# Patient Record
Sex: Male | Born: 1950 | Race: Black or African American | Hispanic: No | Marital: Single | Smoking: Current every day smoker
Health system: Southern US, Community
[De-identification: ages and names within clinical notes are randomized; demographics above are authoritative.]

## PROBLEM LIST (undated history)

## (undated) DIAGNOSIS — K635 Polyp of colon: Secondary | ICD-10-CM

## (undated) DIAGNOSIS — N400 Enlarged prostate without lower urinary tract symptoms: Secondary | ICD-10-CM

## (undated) DIAGNOSIS — E785 Hyperlipidemia, unspecified: Secondary | ICD-10-CM

## (undated) DIAGNOSIS — I10 Essential (primary) hypertension: Secondary | ICD-10-CM

## (undated) DIAGNOSIS — C642 Malignant neoplasm of left kidney, except renal pelvis: Secondary | ICD-10-CM

## (undated) DIAGNOSIS — R911 Solitary pulmonary nodule: Secondary | ICD-10-CM

## (undated) DIAGNOSIS — I739 Peripheral vascular disease, unspecified: Secondary | ICD-10-CM

## (undated) DIAGNOSIS — I2699 Other pulmonary embolism without acute cor pulmonale: Secondary | ICD-10-CM

---

## 2013-03-06 HISTORY — PX: PROSTATE BIOPSY: SHX241

## 2017-03-06 HISTORY — PX: COLONOSCOPY: SHX174

## 2018-02-03 HISTORY — PX: PARTIAL NEPHRECTOMY: SHX414

## 2020-10-06 ENCOUNTER — Emergency Department (HOSPITAL_BASED_OUTPATIENT_CLINIC_OR_DEPARTMENT_OTHER): Payer: No Typology Code available for payment source

## 2020-10-06 ENCOUNTER — Other Ambulatory Visit: Payer: Self-pay

## 2020-10-06 ENCOUNTER — Encounter (HOSPITAL_BASED_OUTPATIENT_CLINIC_OR_DEPARTMENT_OTHER): Payer: Self-pay | Admitting: *Deleted

## 2020-10-06 ENCOUNTER — Inpatient Hospital Stay (HOSPITAL_BASED_OUTPATIENT_CLINIC_OR_DEPARTMENT_OTHER)
Admission: EM | Admit: 2020-10-06 | Discharge: 2020-10-08 | DRG: 065 | Disposition: A | Discharge status: Home / Self Care | Payer: No Typology Code available for payment source | Attending: Internal Medicine | Admitting: Internal Medicine

## 2020-10-06 DIAGNOSIS — R7303 Prediabetes: Secondary | ICD-10-CM | POA: Diagnosis present

## 2020-10-06 DIAGNOSIS — R791 Abnormal coagulation profile: Secondary | ICD-10-CM | POA: Diagnosis present

## 2020-10-06 DIAGNOSIS — E782 Mixed hyperlipidemia: Secondary | ICD-10-CM

## 2020-10-06 DIAGNOSIS — I10 Essential (primary) hypertension: Secondary | ICD-10-CM

## 2020-10-06 DIAGNOSIS — G8194 Hemiplegia, unspecified affecting left nondominant side: Secondary | ICD-10-CM | POA: Diagnosis present

## 2020-10-06 DIAGNOSIS — H53462 Homonymous bilateral field defects, left side: Secondary | ICD-10-CM | POA: Diagnosis present

## 2020-10-06 DIAGNOSIS — F17210 Nicotine dependence, cigarettes, uncomplicated: Secondary | ICD-10-CM | POA: Diagnosis present

## 2020-10-06 DIAGNOSIS — F17200 Nicotine dependence, unspecified, uncomplicated: Secondary | ICD-10-CM | POA: Insufficient documentation

## 2020-10-06 DIAGNOSIS — Z20822 Contact with and (suspected) exposure to covid-19: Secondary | ICD-10-CM | POA: Diagnosis present

## 2020-10-06 DIAGNOSIS — Z905 Acquired absence of kidney: Secondary | ICD-10-CM

## 2020-10-06 DIAGNOSIS — I480 Paroxysmal atrial fibrillation: Secondary | ICD-10-CM | POA: Diagnosis present

## 2020-10-06 DIAGNOSIS — Z85528 Personal history of other malignant neoplasm of kidney: Secondary | ICD-10-CM | POA: Diagnosis not present

## 2020-10-06 DIAGNOSIS — Z79899 Other long term (current) drug therapy: Secondary | ICD-10-CM

## 2020-10-06 DIAGNOSIS — I63411 Cerebral infarction due to embolism of right middle cerebral artery: Secondary | ICD-10-CM | POA: Diagnosis present

## 2020-10-06 DIAGNOSIS — E785 Hyperlipidemia, unspecified: Secondary | ICD-10-CM | POA: Diagnosis present

## 2020-10-06 DIAGNOSIS — Z8719 Personal history of other diseases of the digestive system: Secondary | ICD-10-CM | POA: Diagnosis not present

## 2020-10-06 DIAGNOSIS — Z86711 Personal history of pulmonary embolism: Secondary | ICD-10-CM

## 2020-10-06 DIAGNOSIS — R29703 NIHSS score 3: Secondary | ICD-10-CM | POA: Diagnosis present

## 2020-10-06 DIAGNOSIS — I6389 Other cerebral infarction: Secondary | ICD-10-CM | POA: Diagnosis not present

## 2020-10-06 DIAGNOSIS — N400 Enlarged prostate without lower urinary tract symptoms: Secondary | ICD-10-CM | POA: Diagnosis present

## 2020-10-06 DIAGNOSIS — Z7901 Long term (current) use of anticoagulants: Secondary | ICD-10-CM | POA: Diagnosis not present

## 2020-10-06 DIAGNOSIS — I639 Cerebral infarction, unspecified: Secondary | ICD-10-CM | POA: Diagnosis present

## 2020-10-06 DIAGNOSIS — R29810 Facial weakness: Secondary | ICD-10-CM | POA: Diagnosis present

## 2020-10-06 HISTORY — DX: Solitary pulmonary nodule: R91.1

## 2020-10-06 HISTORY — DX: Hyperlipidemia, unspecified: E78.5

## 2020-10-06 HISTORY — DX: Essential (primary) hypertension: I10

## 2020-10-06 HISTORY — DX: Malignant neoplasm of left kidney, except renal pelvis: C64.2

## 2020-10-06 HISTORY — DX: Mixed hyperlipidemia: E78.2

## 2020-10-06 HISTORY — DX: Peripheral vascular disease, unspecified: I73.9

## 2020-10-06 HISTORY — DX: Benign prostatic hyperplasia without lower urinary tract symptoms: N40.0

## 2020-10-06 HISTORY — DX: Polyp of colon: K63.5

## 2020-10-06 HISTORY — DX: Other pulmonary embolism without acute cor pulmonale: I26.99

## 2020-10-06 HISTORY — DX: Cerebral infarction, unspecified: I63.9

## 2020-10-06 LAB — CBC
HCT: 43.7 % (ref 39.0–52.0)
Hemoglobin: 14.3 g/dL (ref 13.0–17.0)
MCH: 26.3 pg (ref 26.0–34.0)
MCHC: 32.7 g/dL (ref 30.0–36.0)
MCV: 80.5 fL (ref 80.0–100.0)
Platelets: 194 10*3/uL (ref 150–400)
RBC: 5.43 MIL/uL (ref 4.22–5.81)
RDW: 15.8 % — ABNORMAL HIGH (ref 11.5–15.5)
WBC: 6.3 10*3/uL (ref 4.0–10.5)
nRBC: 0 % (ref 0.0–0.2)

## 2020-10-06 LAB — URINALYSIS, MICROSCOPIC (REFLEX)

## 2020-10-06 LAB — BASIC METABOLIC PANEL
Anion gap: 8 (ref 5–15)
BUN: 20 mg/dL (ref 8–23)
CO2: 25 mmol/L (ref 22–32)
Calcium: 8.7 mg/dL — ABNORMAL LOW (ref 8.9–10.3)
Chloride: 103 mmol/L (ref 98–111)
Creatinine, Ser: 1.15 mg/dL (ref 0.61–1.24)
GFR, Estimated: >60 mL/min (ref >60)
Glucose, Bld: 143 mg/dL — ABNORMAL HIGH (ref 70–99)
Potassium: 3.3 mmol/L — ABNORMAL LOW (ref 3.5–5.1)
Sodium: 136 mmol/L (ref 135–145)

## 2020-10-06 LAB — RESP PANEL BY RT-PCR (FLU A&B, COVID) ARPGX2
Influenza A by PCR: NEGATIVE
Influenza B by PCR: NEGATIVE
SARS Coronavirus 2 by RT PCR: NEGATIVE

## 2020-10-06 LAB — URINALYSIS, ROUTINE W REFLEX MICROSCOPIC
Bilirubin Urine: NEGATIVE
Glucose, UA: NEGATIVE mg/dL
Ketones, ur: NEGATIVE mg/dL
Nitrite: NEGATIVE
Protein, ur: NEGATIVE mg/dL
Specific Gravity, Urine: 1.02 (ref 1.005–1.030)
pH: 6 (ref 5.0–8.0)

## 2020-10-06 LAB — HEPATIC FUNCTION PANEL
ALT: 15 U/L (ref 0–44)
AST: 26 U/L (ref 15–41)
Albumin: 4.3 g/dL (ref 3.5–5.0)
Alkaline Phosphatase: 81 U/L (ref 38–126)
Bilirubin, Direct: 0.2 mg/dL (ref 0.0–0.2)
Indirect Bilirubin: 0.6 mg/dL (ref 0.3–0.9)
Total Bilirubin: 0.8 mg/dL (ref 0.3–1.2)
Total Protein: 7.8 g/dL (ref 6.5–8.1)

## 2020-10-06 LAB — PROTIME-INR
INR: 1.8 — ABNORMAL HIGH (ref 0.8–1.2)
Prothrombin Time: 20.4 seconds — ABNORMAL HIGH (ref 11.4–15.2)

## 2020-10-06 LAB — TROPONIN I (HIGH SENSITIVITY)
Troponin I (High Sensitivity): 9 ng/L (ref <18)
Troponin I (High Sensitivity): 9 ng/L (ref <18)

## 2020-10-06 LAB — CBG MONITORING, ED: Glucose-Capillary: 136 mg/dL — ABNORMAL HIGH (ref 70–99)

## 2020-10-06 MED ORDER — IOHEXOL 350 MG/ML SOLN
100.0000 mL | Freq: Once | INTRAVENOUS | Status: AC | PRN
  Administered 2020-10-06: 100 mL via INTRAVENOUS

## 2020-10-06 MED ORDER — PSYLLIUM 95 % PO PACK
1.0000 | PACK | Freq: Every day | ORAL | Status: DC
  Administered 2020-10-07 – 2020-10-08 (×2): 1 via ORAL
  Filled 2020-10-06 (×2): qty 1

## 2020-10-06 MED ORDER — ASPIRIN 300 MG RE SUPP: 300.0000 mg | Freq: Every day | RECTAL | Status: DC

## 2020-10-06 MED ORDER — LABETALOL HCL 5 MG/ML IV SOLN
10.0000 mg | Freq: Once | INTRAVENOUS | Status: AC
  Administered 2020-10-06: 10 mg via INTRAVENOUS
  Filled 2020-10-06: qty 4

## 2020-10-06 MED ORDER — PSYLLIUM 58.6 % PO PACK: 1.0000 | PACK | Freq: Every day | ORAL | Status: DC

## 2020-10-06 MED ORDER — ASPIRIN 325 MG PO TABS
325.0000 mg | ORAL_TABLET | Freq: Every day | ORAL | Status: DC
  Administered 2020-10-07 – 2020-10-08 (×2): 325 mg via ORAL
  Filled 2020-10-06 (×2): qty 1

## 2020-10-06 MED ORDER — ATORVASTATIN CALCIUM 40 MG PO TABS
40.0000 mg | ORAL_TABLET | Freq: Every day | ORAL | Status: DC
  Administered 2020-10-07 – 2020-10-08 (×2): 40 mg via ORAL
  Filled 2020-10-06 (×2): qty 1

## 2020-10-06 MED ORDER — ACETAMINOPHEN 650 MG RE SUPP: 650.0000 mg | RECTAL | Status: DC | PRN

## 2020-10-06 MED ORDER — ACETAMINOPHEN 325 MG PO TABS: 650.0000 mg | ORAL_TABLET | ORAL | Status: DC | PRN

## 2020-10-06 MED ORDER — STROKE: EARLY STAGES OF RECOVERY BOOK
Freq: Once | Status: AC
  Filled 2020-10-06: qty 1

## 2020-10-06 MED ORDER — FINASTERIDE 5 MG PO TABS
5.0000 mg | ORAL_TABLET | Freq: Every day | ORAL | Status: DC
  Administered 2020-10-07 – 2020-10-08 (×2): 5 mg via ORAL
  Filled 2020-10-06 (×2): qty 1

## 2020-10-06 MED ORDER — ACETAMINOPHEN 160 MG/5ML PO SOLN: 650.0000 mg | ORAL | Status: DC | PRN

## 2020-10-06 MED ORDER — NICOTINE 21 MG/24HR TD PT24
21.0000 mg | MEDICATED_PATCH | Freq: Every day | TRANSDERMAL | Status: DC
  Administered 2020-10-06 – 2020-10-08 (×3): 21 mg via TRANSDERMAL
  Filled 2020-10-06 (×3): qty 1

## 2020-10-06 MED ORDER — ENOXAPARIN SODIUM 40 MG/0.4ML IJ SOSY
40.0000 mg | PREFILLED_SYRINGE | INTRAMUSCULAR | Status: DC
  Administered 2020-10-07 – 2020-10-08 (×2): 40 mg via SUBCUTANEOUS
  Filled 2020-10-06 (×2): qty 0.4

## 2020-10-06 NOTE — ED Notes (Signed)
MD ware of pt

## 2020-10-06 NOTE — ED Notes (Signed)
Report given to [facility] 

## 2020-10-06 NOTE — Consult Note (Addendum)
Neurology Consultation Reason for Consult: Stroke Referring Physician: Rae Halsted  CC: Vision change  History is obtained from: Patient  HPI: Clifford [name] is a 70 y.o. male with a history of hypertension, hyperlipidemia, pulmonary embolus on Coumadin who presents with vision change and mild left-sided weakness that started 2 days ago.  He lives in [place] and sees the [place], but is in town recently visiting family.  He endorses balance difficulty as well.  Due to these problems he sought care in the emergency department at [facility] where CT was performed demonstrating a subacute stroke.   LKW: 2 days ago tpa given?: no, out of window   ROS: A 14 point ROS was performed and is negative except as noted in the HPI.   Past Medical History:  Diagnosis Date   BPH (benign prostatic hyperplasia)    HLD (hyperlipidemia)    HTN (hypertension)    Lung nodule    3m in 2015   [place]    Peripheral vascular disease (HCC)    Polyp of colon    [place] ([facility]      History reviewed. No pertinent family history.   Social History:  reports that he has been smoking cigarettes. He has been smoking an average of .5 packs per day. He has never used smokeless tobacco. He reports that he does not drink alcohol and does not use drugs.   Exam: Current vital signs: BP 139/68 (BP Location: Right Arm)   Pulse (!) 59   Temp 98 F (36.7 C) (Oral)   Resp 18   Ht '5\' 5"'$  (1.651 m)   SpO2 99%  Vital signs in last 24 hours: Temp:  [98 F (36.7 C)-98.6 F (37 C)] 98 F (36.7 C) (08/03 2048) Pulse Rate:  [59-86] 59 (08/03 2048) Resp:  [16-22] 18 (08/03 2048) BP: (137-219)/(68-176) 139/68 (08/03 2048) SpO2:  [98 %-100 %] 99 % (08/03 1700)   Physical Exam  Constitutional: Appears well-developed and well-nourished.  Psych: Affect appropriate to situation Eyes: No scleral injection HENT: No OP obstruction MSK: no joint deformities.  Cardiovascular:  Normal rate and regular rhythm.  Respiratory: Effort normal, non-labored breathing GI: Soft.  No distension. There is no tenderness.  Skin: WDI  Neuro: Mental Status: Patient is awake, alert, oriented to person, place, month, year, and situation. Patient is able to give a clear and coherent history. No signs of aphasia  He has a mild left motor neglect.   Cranial Nerves: II: Left hemianopia. Pupils are equal, round, and reactive to light.   III,IV, VI: EOMI without ptosis or diploplia.  V: Facial sensation is symmetric to temperature VII: Facial movement with mild left facial weakness VIII: hearing is intact to voice X: Uvula elevates symmetrically XI: Shoulder shrug is symmetric. XII: tongue is midline without atrophy or fasciculations.  Motor: Tone is normal. Bulk is normal. 5/5 strength was present on the right, he has 4+/5 strength of the left arm and leg Sensory: Sensation is symmetric to light touch and temperature in the arms and legs.Cerebellar: FNF  intact bilaterally  NIHSS score: 3 1A: Level of Consciousness - 0 1B: Ask Month and Age - 0 1C: 'Blink Eyes' & 'Squeeze Hands' - 0 2: Test Horizontal Extraocular Movements - 0 3: Test Visual Fields - 2 4: Test Facial Palsy - 0 5A: Test Left Arm Motor Drift - 1 5B: Test Right Arm Motor Drift - 0 6A: Test Left Leg Motor Drift -  0 6B: Test Right Leg Motor Drift - 0 7: Test Limb Ataxia - 0 8: Test Sensation - 0 9: Test Language/Aphasia- 0 10: Test Dysarthria - 0 11: Test Extinction/Inattention - 0    I have reviewed labs in epic and the results pertinent to this consultation are: Creatinine 1.15 CBC-negative  I have reviewed the images obtained: CT head-right parietal infarct in the posterior right MCA distribution  Impression: 70 year old male with what I suspect to be an embolic stroke.  He is subtherapeutic on his INR, and with his history of thromboembolism, left-to-right shunt is a possibility.  Also with 50 to  70% stenosis in the right carotid siphon, which could be a potential source of embolism, though in that range it is slightly less common.  Recommendations: 1) lipids, A1c 2) MRI brain 3) echocardiogram 4) telemetry 5) would hold anticoagulation, at least pending MRI 6) ASA 325 mg daily 7) if no other causes are found, may need to consider discussing carotid stenosis with neuro IR  Roland Rack, MD [facility] [phone]  If 7pm- 7am, please page neurology on call as listed in [place].

## 2020-10-06 NOTE — ED Triage Notes (Signed)
C/o unsteady gait , left sided weakness and left side vision loss , increased bp , h/a x 3 days.

## 2020-10-06 NOTE — H&P (Signed)
History and Physical    Clifford [name] [id number] DOB: [birth year] DOA: 10/06/2020  PCP: System, Provider Not In  Patient coming from: Home  I have personally briefly reviewed patient's old medical records in [place]  Chief Complaint: Vision change  HPI: Clifford [name] is a 70 y.o. male with medical history significant of HTN, HLD, PAD, PEs on coumdin chronically.  Last PE was at least 6+ months ago pt thinks.  Pt presents to ED at [facility] with c/o vision change and mild L sided weakness.  Symptoms onset 2 days ago, symptoms persistent and constant.  Nothing makes symptoms better or worse.  He has loss of L sided vision in both eyes.  Pt lives in [place] and normally seen at [place] but is in town currently visiting family.  Has balance difficulty as well.   ED Course: CT demonstrates subacute stroke R MCA territory.  CTA: 1) 50-70% R carotid stenosis 2) no intracranial LVO  Pt transferred to [facility] for [facility].   Review of Systems: As per HPI, otherwise all review of systems negative.  Past Medical History:  Diagnosis Date   BPH (benign prostatic hyperplasia)    HLD (hyperlipidemia)    HTN (hypertension)    Lung nodule    41m in 2015   Malign[facility]    Peripheral vascular disease (HCC)    Polyp of colon    Pulmonary embolus ([facility]     Past Surgical History:  Procedure Laterality Date   COLONOSCOPY  03/2017   PARTIAL NEPHRECTOMY Left 02/2018   PROSTATE BIOPSY  2015     reports that he has been smoking cigarettes. He has been smoking an average of .5 packs per day. He has never used smokeless tobacco. He reports that he does not drink alcohol and does not use drugs.  Allergies  Allergen Reactions   Apixaban     Other reaction(s): Liver enzymes abnormal   Lisinopril     Other reaction(s): Cough (finding)   Simvastatin     Other reaction(s): Pain in lower limb (finding)    Family History  Problem Relation Age of Onset   Clotting  disorder Neg Hx      Prior to Admission medications   Medication Sig Start Date End Date Taking? Authorizing Provider  atorvastatin (LIPITOR) 80 MG tablet Take 40 mg by mouth 1 day or 1 dose. 12/04/19  Yes [provider]  finasteride (PROSCAR) 5 MG tablet Take 5 mg by mouth daily at 6 (six) AM. 08/05/20  Yes [provider]  hydrochlorothiazide (HYDRODIURIL) 25 MG tablet Take 1 tablet by mouth daily. 12/04/19  Yes [provider]  losartan (COZAAR) 50 MG tablet Take 100 mg by mouth daily. 12/04/19  Yes [provider]  potassium chloride SA (KLOR-CON) 20 MEQ tablet Take 20 mEq by mouth daily. 12/04/19  Yes [provider]  psyllium (METAMUCIL) 58.6 % packet Take 1 packet by mouth daily. 04/08/20  Yes [provider]  warfarin (COUMADIN) 5 MG tablet Take 5-7.5 mg by mouth See admin instructions. Take one tablet by mouth daily except for wed take 7.5 mg tablet per patient   Yes [provider]    Physical Exam: Vitals:   10/06/20 1630 10/06/20 1700 10/06/20 1810 10/06/20 2048  BP: 137/78 (!) 142/81 (!) 161/89 139/68  Pulse: 66 66 65 (!) 59  Resp: '17 19 18 18  '$ Temp:   98 F (36.7 C) 98 F (36.7 C)  TempSrc:   Oral Oral  SpO2: 100% 99%    Height:        Constitutional: NAD, calm, comfortable Eyes: PERRL, lids and conjunctivae normal ENMT: Mucous membranes are moist. Posterior pharynx clear of any exudate or lesions.Normal dentition.  Neck: normal, supple, no masses, no thyromegaly Respiratory: clear to auscultation bilaterally, no wheezing, no crackles. Normal respiratory effort. No accessory muscle use.  Cardiovascular: Regular rate and rhythm, no murmurs / rubs / gallops. No extremity edema. 2+ pedal pulses. No carotid bruits.  Abdomen: no tenderness, no masses palpated. No hepatosplenomegaly. Bowel sounds positive.  Musculoskeletal: no clubbing / cyanosis. No joint deformity upper and lower extremities. Good ROM, no  contractures. Normal muscle tone.  Skin: no rashes, lesions, ulcers. No induration Neurologic: Mild L motor neglect, L hemianopia Psychiatric: Normal judgment and insight. Alert and oriented x 3. Normal mood.    Labs on Admission: I have personally reviewed following labs and imaging studies  CBC: Recent Labs  Lab 10/06/20 1353  WBC 6.3  HGB 14.3  HCT 43.7  MCV 80.5  PLT [id number]   Basic Metabolic Panel: Recent Labs  Lab 10/06/20 1353  NA 136  K 3.3*  CL 103  CO2 25  GLUCOSE 143*  BUN 20  CREATININE 1.15  CALCIUM 8.7*   GFR: CrCl cannot be calculated (Unknown ideal weight.). Liver Function Tests: Recent Labs  Lab 10/06/20 1434  AST 26  ALT 15  ALKPHOS 81  BILITOT 0.8  PROT 7.8  ALBUMIN 4.3   No results for input(s): LIPASE, AMYLASE in the last 168 hours. No results for input(s): AMMONIA in the last 168 hours. Coagulation Profile: Recent Labs  Lab 10/06/20 1434  INR 1.8*   Cardiac Enzymes: No results for input(s): CKTOTAL, CKMB, CKMBINDEX, TROPONINI in the last 168 hours. BNP (last 3 results) No results for input(s): PROBNP in the last 8760 hours. HbA1C: No results for input(s): HGBA1C in the last 72 hours. CBG: Recent Labs  Lab 10/06/20 1351  GLUCAP 136*   Lipid Profile: No results for input(s): CHOL, HDL, LDLCALC, TRIG, CHOLHDL, LDLDIRECT in the last 72 hours. Thyroid Function Tests: No results for input(s): TSH, T4TOTAL, FREET4, T3FREE, THYROIDAB in the last 72 hours. Anemia Panel: No results for input(s): VITAMINB12, FOLATE, FERRITIN, TIBC, IRON, RETICCTPCT in the last 72 hours. Urine analysis:    Component Value Date/Time   COLORURINE YELLOW 10/06/2020 1353   APPEARANCEUR CLEAR 10/06/2020 1353   LABSPEC 1.020 10/06/2020 1353   PHURINE 6.0 10/06/2020 1353   GLUCOSEU NEGATIVE 10/06/2020 1353   HGBUR TRACE (A) 10/06/2020 1353   BILIRUBINUR NEGATIVE 10/06/2020 1353   KETONESUR NEGATIVE 10/06/2020 1353   PROTEINUR NEGATIVE 10/06/2020 1353    NITRITE NEGATIVE 10/06/2020 1353   LEUKOCYTESUR SMALL (A) 10/06/2020 1353    Radiological Exams on Admission: CT Angio Head W or Wo Contrast  Result Date: 10/06/2020 CLINICAL DATA:  Stroke. TIA. Assess intracranial arteries. Unsteady gait. Left-sided weakness. Left-sided visual loss. Headache. EXAM: CT ANGIOGRAPHY HEAD AND NECK TECHNIQUE: Multidetector CT imaging of the head and neck was performed using the standard protocol during bolus administration of intravenous contrast. Multiplanar CT image reconstructions and MIPs were obtained to evaluate the vascular anatomy. Carotid stenosis measurements (when applicable) are obtained utilizing NASCET criteria, using the distal internal carotid diameter as the denominator. CONTRAST:  187m OMNIPAQUE IOHEXOL 350 MG/ML SOLN COMPARISON:  Head CT earlier same day FINDINGS: CTA NECK FINDINGS Aortic arch: Aortic atherosclerosis.  Branching pattern is normal. Right carotid system: Common  carotid artery is tortuous but widely patent to the bifurcation. Carotid bifurcation is widely patent. Minimal calcified plaque at the distal ICA bulb but no stenosis. Cervical ICA widely patent. Left carotid system: Common carotid artery is tortuous but widely patent to the bifurcation. Carotid bifurcation is normal. Minimal calcified plaque at the ICA bulb but no stenosis. Cervical ICA widely patent beyond that. Vertebral arteries: Right vertebral artery origin is widely patent. Left vertebral artery origin is poorly seen because of regional venous reflux. Both vertebral arteries do appear patent through the cervical region to the foramen magnum. Skeleton: Ordinary cervical spondylosis. Other neck: No mass or lymphadenopathy. Upper chest: Upper lungs are clear. Review of the MIP images confirms the above findings CTA HEAD FINDINGS Anterior circulation: Both internal carotid arteries are patent through the skull base and siphon regions. There is ordinary siphon atherosclerotic  calcification. On the left, there is no stenosis. On the right, there is 50-70% stenosis in the siphon. Supraclinoid internal carotid arteries are widely patent. The anterior and middle cerebral vessels are patent. No large vessel occlusion, with particular attention to the right MCA. Posterior circulation: Both vertebral arteries are patent through the foramen magnum to the basilar. No basilar stenosis. Posterior circulation branch vessels are normal. Venous sinuses: Patent and normal. Anatomic variants: None significant. Review of the MIP images confirms the above findings IMPRESSION: No intracranial large vessel occlusion identified at this time, with specific attention to the right MCA branches. Minimal atherosclerotic change at both internal carotid artery bulbs. No stenosis. 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 No stenosis on the left. 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 Electronically Signed   By: Nelson Chimes M.D.   On: 10/06/2020 15:01   CT HEAD WO CONTRAST (5MM)  Result Date: 10/06/2020 CLINICAL DATA:  Neuro deficit, acute, stroke suspected. Steady gait, left-sided weakness and left-sided vision loss, increased blood pressure, headache for 3 days, history of hypertension. EXAM: CT HEAD WITHOUT CONTRAST TECHNIQUE: Contiguous axial images were obtained from the base of the skull through the vertex without intravenous contrast. COMPARISON:  No pertinent prior exams available for comparison. FINDINGS: Brain: Cerebral volume is normal for age. Region of abnormal cortical/subcortical hypodensity measuring 6.3 x 3.3 x 5.8 cm within the right parietooccipital lobes compatible with acute/early subacute infarction (right PCA vascular territory and right MCA/PCA watershed territory). No significant mass effect at this time. No evidence of hemorrhagic conversion. Chronic appearing lacunar infarct within the left lentiform nucleus. No extra-axial fluid collection. No evidence of an  intracranial mass. No midline shift. Vascular: No hyperdense vessel.  Atherosclerotic calcifications. Skull: Normal. Negative for fracture or focal lesion. Sinuses/Orbits: Visualized orbits show no acute finding. Mild mucosal thickening within the frontal sinuses bilaterally. Mild-to-moderate mucosal thickening within the bilateral ethmoid air cells. These results were discussed by telephone at the time of interpretation on 10/06/2020 at 1:55 pm with provider MATTHEW TRIFAN , who verbally acknowledged these results. IMPRESSION: 6.3 x 3.3 x 5.8 cm acute/early subacute cortical and subcortical infarct within the right parietooccipital lobes (right PCA vascular territory and right MCA/PCA watershed territory). Consider MR or CT angiography for further evaluation. Chronic left basal ganglia lacunar infarct. Paranasal sinus disease at the imaged levels, as described. Electronically Signed   By: Kellie Simmering DO   On: 10/06/2020 14:13   CT Angio Neck W and/or Wo Contrast  Result Date: 10/06/2020 CLINICAL DATA:  Stroke. TIA. Assess intracranial arteries. Unsteady gait. Left-sided weakness. Left-sided visual loss. Headache. EXAM: CT ANGIOGRAPHY  HEAD AND NECK TECHNIQUE: Multidetector CT imaging of the head and neck was performed using the standard protocol during bolus administration of intravenous contrast. Multiplanar CT image reconstructions and MIPs were obtained to evaluate the vascular anatomy. Carotid stenosis measurements (when applicable) are obtained utilizing NASCET criteria, using the distal internal carotid diameter as the denominator. CONTRAST:  130m OMNIPAQUE IOHEXOL 350 MG/ML SOLN COMPARISON:  Head CT earlier same day FINDINGS: CTA NECK FINDINGS Aortic arch: Aortic atherosclerosis.  Branching pattern is normal. Right carotid system: Common carotid artery is tortuous but widely patent to the bifurcation. Carotid bifurcation is widely patent. Minimal calcified plaque at the distal ICA bulb but no stenosis.  Cervical ICA widely patent. Left carotid system: Common carotid artery is tortuous but widely patent to the bifurcation. Carotid bifurcation is normal. Minimal calcified plaque at the ICA bulb but no stenosis. Cervical ICA widely patent beyond that. Vertebral arteries: Right vertebral artery origin is widely patent. Left vertebral artery origin is poorly seen because of regional venous reflux. Both vertebral arteries do appear patent through the cervical region to the foramen magnum. Skeleton: Ordinary cervical spondylosis. Other neck: No mass or lymphadenopathy. Upper chest: Upper lungs are clear. Review of the MIP images confirms the above findings CTA HEAD FINDINGS Anterior circulation: Both internal carotid arteries are patent through the skull base and siphon regions. There is ordinary siphon atherosclerotic calcification. On the left, there is no stenosis. On the right, there is 50-70% stenosis in the siphon. Supraclinoid internal carotid arteries are widely patent. The anterior and middle cerebral vessels are patent. No large vessel occlusion, with particular attention to the right MCA. Posterior circulation: Both vertebral arteries are patent through the foramen magnum to the basilar. No basilar stenosis. Posterior circulation branch vessels are normal. Venous sinuses: Patent and normal. Anatomic variants: None significant. Review of the MIP images confirms the above findings IMPRESSION: No intracranial large vessel occlusion identified at this time, with specific attention to the right MCA branches. Minimal atherosclerotic change at both internal carotid artery bulbs. No stenosis. 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 No stenosis on the left. 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 Electronically Signed   By: MNelson ChimesM.D.   On: 10/06/2020 15:01    EKG: Independently reviewed.  Assessment/Plan Principal Problem:   Stroke ([facility] Active Problems:   HTN (hypertension)    HLD (hyperlipidemia)    Acute ischemic stroke - Neuro suspects embolic stroke L to R shunt with h/o thromboembolism and subtheraputic INR vs carotid artery source though only 50-70% Stroke pathway MRI brain Tele monitor Hold coumadin for the moment ASA FLP, A1C PT/OT/SLP If no other causes found: consider discussing carotid stenosis with NIR / vasc surg. HTN - Hold home BP meds and allow permissive HTN HLD - Cont statin FLP pending  DVT prophylaxis: Lovenox - start tomorrow at 10am, holding coumadin for the moment Code Status: Full Family Communication: No family in room Disposition Plan: Likely home after stroke work up, PT eval Consults called: Dr. KLeonel RamsayAdmission status: Admit to inpatient  Severity of Illness: The appropriate patient status for this patient is INPATIENT. Inpatient status is judged to be reasonable and necessary in order to provide the required intensity of service to ensure the patient's safety. The patient's presenting symptoms, physical exam findings, and initial radiographic and laboratory data in the context of their chronic comorbidities is felt to place them at high risk for further clinical deterioration. Furthermore, it is not anticipated that the patient  will be medically stable for discharge from the hospital within 2 midnights of admission. The following factors support the patient status of inpatient.   IP status due to acute ischemic stroke confirmed on CT scan with neuro deficits.   * I certify that at the point of admission it is my clinical judgment that the patient will require inpatient hospital care spanning beyond 2 midnights from the point of admission due to high intensity of service, high risk for further deterioration and high frequency of surveillance required.*   Carrina Schoenberger M. DO [facility]  How to contact the [facility] Attending or Consulting provider [place] or covering provider during after hours [place], for this  patient?  Check the care team in [facility] and look for a) attending/consulting [facility] provider listed and b) the [facility] team listed Log into [email or web address]  Amion Physician Scheduling and messaging for groups and whole hospitals  On call and physician scheduling software for group practices, residents, hospitalists and other medical providers for call, clinic, rotation and shift schedules. On[employer] is a hospital-wide system for scheduling doctors and paging doctors on call. EasyPlot is for scientific plotting and data analysis.  [email or web address]  and use [place] universal password to access. If you do not have the password, please contact the hospital operator.  Locate the [facility] provider you are looking for under [facility] and page to a number that you can be directly reached. If you still have difficulty reaching the provider, please page the [facility] (Director on Call) for the Hospitalists listed on amion for assistance.  10/06/2020, 10:15 PM

## 2020-10-06 NOTE — ED Notes (Signed)
Pt has had 2-3 day hx of headaches and some visual field loss.  Needs assistance ambulating related to his vision per brother.  Strength is equal in upper and lower extremities.  Alert and oriented, follows all commands.  Pt was seen at the [place] and they requested he come to ED foe further evaluation.  Pt is visiting here from [place]

## 2020-10-06 NOTE — ED Notes (Signed)
Attempted to call report to [place], nurse unavailable to receive report, nurse will return call

## 2020-10-06 NOTE — ED Provider Notes (Signed)
[facility] Provider Note   CSN: [id number] Arrival date & time: 10/06/20  1319     History Chief Complaint  Patient presents with   Weakness    Clifford [name] is a 70 y.o. male with a history of hypertension, pulmonary embolism on Coumadin, hyperlipidemia, presenting to emergency department left-sided weakness and visual loss.  The patient is here with his brother.  The patient lives originally in [place] and follows at the [place] [facility] there, but is here in town visiting family.  He reports that he began having left-sided visual loss in the corner of his vision 2 to 3 days ago.  Likewise at the same time he noticed that he was having balance difficulty, running into things, and had a headache throbbing behind his eyes.    He denies any history of TIA.  Denies smoking history.  He has been compliant with his Coumadin his other medications.  He took all of his morning medications today.  HPI     Past Medical History:  Diagnosis Date   HTN (hypertension)     Patient Active Problem List   Diagnosis Date Noted   Stroke ([place]) 10/06/2020    History reviewed. No pertinent surgical history.     History reviewed. No pertinent family history.  Social History   Tobacco Use   Smoking status: Every Day    Packs/day: 0.50    Types: Cigarettes   Smokeless tobacco: Never  Vaping Use   Vaping Use: Never used  Substance Use Topics   Alcohol use: Never   Drug use: Never    Home Medications Prior to Admission medications   Medication Sig Start Date End Date Taking? Authorizing Provider  atorvastatin (LIPITOR) 80 MG tablet Take 40 mg by mouth 1 day or 1 dose. 12/04/19  Yes [provider]  finasteride (PROSCAR) 5 MG tablet Take 5 mg by mouth daily at 6 (six) AM. 08/05/20  Yes [provider]  hydrochlorothiazide (HYDRODIURIL) 25 MG tablet Take 1 tablet by mouth daily. 12/04/19  Yes [provider]  losartan (COZAAR) 50 MG tablet  Take 100 mg by mouth daily. 12/04/19  Yes [provider]  potassium chloride SA (KLOR-CON) 20 MEQ tablet Take 20 mEq by mouth daily. 12/04/19  Yes [provider]  psyllium (METAMUCIL) 58.6 % packet Take 1 packet by mouth daily. 04/08/20  Yes [provider]  warfarin (COUMADIN) 5 MG tablet Take 5 mg by mouth daily. Sunday -sat except for wed.  Wed takes 7.'5mg'$    Yes [provider]    Allergies    Apixaban, Lisinopril, and Simvastatin  Review of Systems   Review of Systems  Constitutional:  Negative for chills and fever.  Eyes:  Positive for visual disturbance. Negative for photophobia.  Respiratory:  Negative for cough and shortness of breath.   Cardiovascular:  Negative for chest pain and palpitations.  Gastrointestinal:  Negative for abdominal pain and vomiting.  Genitourinary:  Negative for dysuria and hematuria.  Musculoskeletal:  Negative for arthralgias and back pain.  Skin:  Negative for color change and rash.  Neurological:  Positive for dizziness, weakness, light-headedness, numbness and headaches. Negative for seizures and syncope.  All other systems reviewed and are negative.  Physical Exam Updated Vital Signs BP (!) 156/88   Pulse 60   Temp 98.6 F (37 C) (Oral)   Resp 16   Ht '5\' 5"'$  (1.651 m)   SpO2 98%   Physical Exam Constitutional:  General: He is not in acute distress. HENT:     Head: Normocephalic and atraumatic.  Eyes:     Extraocular Movements: Extraocular movements intact.     Pupils: Pupils are equal, round, and reactive to light.  Cardiovascular:     Rate and Rhythm: Normal rate and regular rhythm.  Pulmonary:     Effort: Pulmonary effort is normal. No respiratory distress.  Abdominal:     General: There is no distension.     Tenderness: There is no abdominal tenderness.  Skin:    General: Skin is warm and dry.  Neurological:     Mental Status: He is alert and oriented to person, place, and time. Mental  status is at baseline.     Comments: Left sided hemianopsia bilateral eyes Difficulty with finger to nose left hand +pronator drift left arm Normal strength testing left arm and left leg Speech clear  Psychiatric:        Mood and Affect: Mood normal.        Behavior: Behavior normal.    ED Results / Procedures / Treatments   Labs (all labs ordered are listed, but only abnormal results are displayed) Labs Reviewed  BASIC METABOLIC PANEL - Abnormal; Notable for the following components:      Result Value   Potassium 3.3 (*)    Glucose, Bld 143 (*)    Calcium 8.7 (*)    All other components within normal limits  CBC - Abnormal; Notable for the following components:   RDW 15.8 (*)    All other components within normal limits  URINALYSIS, ROUTINE W REFLEX MICROSCOPIC - Abnormal; Notable for the following components:   Hgb urine dipstick TRACE (*)    Leukocytes,Ua SMALL (*)    All other components within normal limits  PROTIME-INR - Abnormal; Notable for the following components:   Prothrombin Time 20.4 (*)    INR 1.8 (*)    All other components within normal limits  URINALYSIS, MICROSCOPIC (REFLEX) - Abnormal; Notable for the following components:   Bacteria, UA RARE (*)    All other components within normal limits  CBG MONITORING, ED - Abnormal; Notable for the following components:   Glucose-Capillary 136 (*)    All other components within normal limits  RESP PANEL BY RT-PCR (FLU A&B, COVID) ARPGX2  HEPATIC FUNCTION PANEL  TROPONIN I (HIGH SENSITIVITY)  TROPONIN I (HIGH SENSITIVITY)    EKG None  Radiology CT Angio Head W or Wo Contrast  Result Date: 10/06/2020 CLINICAL DATA:  Stroke. TIA. Assess intracranial arteries. Unsteady gait. Left-sided weakness. Left-sided visual loss. Headache. EXAM: CT ANGIOGRAPHY HEAD AND NECK TECHNIQUE: Multidetector CT imaging of the head and neck was performed using the standard protocol during bolus administration of intravenous contrast.  Multiplanar CT image reconstructions and MIPs were obtained to evaluate the vascular anatomy. Carotid stenosis measurements (when applicable) are obtained utilizing NASCET criteria, using the distal internal carotid diameter as the denominator. CONTRAST:  139m OMNIPAQUE IOHEXOL 350 MG/ML SOLN COMPARISON:  Head CT earlier same day FINDINGS: CTA NECK FINDINGS Aortic arch: Aortic atherosclerosis.  Branching pattern is normal. Right carotid system: Common carotid artery is tortuous but widely patent to the bifurcation. Carotid bifurcation is widely patent. Minimal calcified plaque at the distal ICA bulb but no stenosis. Cervical ICA widely patent. Left carotid system: Common carotid artery is tortuous but widely patent to the bifurcation. Carotid bifurcation is normal. Minimal calcified plaque at the ICA bulb but no stenosis. Cervical ICA widely patent beyond  that. Vertebral arteries: Right vertebral artery origin is widely patent. Left vertebral artery origin is poorly seen because of regional venous reflux. Both vertebral arteries do appear patent through the cervical region to the foramen magnum. Skeleton: Ordinary cervical spondylosis. Other neck: No mass or lymphadenopathy. Upper chest: Upper lungs are clear. Review of the MIP images confirms the above findings CTA HEAD FINDINGS Anterior circulation: Both internal carotid arteries are patent through the skull base and siphon regions. There is ordinary siphon atherosclerotic calcification. On the left, there is no stenosis. On the right, there is 50-70% stenosis in the siphon. Supraclinoid internal carotid arteries are widely patent. The anterior and middle cerebral vessels are patent. No large vessel occlusion, with particular attention to the right MCA. Posterior circulation: Both vertebral arteries are patent through the foramen magnum to the basilar. No basilar stenosis. Posterior circulation branch vessels are normal. Venous sinuses: Patent and normal.  Anatomic variants: None significant. Review of the MIP images confirms the above findings IMPRESSION: No intracranial large vessel occlusion identified at this time, with specific attention to the right MCA branches. Minimal atherosclerotic change at both internal carotid artery bulbs. No stenosis. 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 No stenosis on the left. 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 Electronically Signed   By: Nelson Chimes M.D.   On: 10/06/2020 15:01   CT HEAD WO CONTRAST (5MM)  Result Date: 10/06/2020 CLINICAL DATA:  Neuro deficit, acute, stroke suspected. Steady gait, left-sided weakness and left-sided vision loss, increased blood pressure, headache for 3 days, history of hypertension. EXAM: CT HEAD WITHOUT CONTRAST TECHNIQUE: Contiguous axial images were obtained from the base of the skull through the vertex without intravenous contrast. COMPARISON:  No pertinent prior exams available for comparison. FINDINGS: Brain: Cerebral volume is normal for age. Region of abnormal cortical/subcortical hypodensity measuring 6.3 x 3.3 x 5.8 cm within the right parietooccipital lobes compatible with acute/early subacute infarction (right PCA vascular territory and right MCA/PCA watershed territory). No significant mass effect at this time. No evidence of hemorrhagic conversion. Chronic appearing lacunar infarct within the left lentiform nucleus. No extra-axial fluid collection. No evidence of an intracranial mass. No midline shift. Vascular: No hyperdense vessel.  Atherosclerotic calcifications. Skull: Normal. Negative for fracture or focal lesion. Sinuses/Orbits: Visualized orbits show no acute finding. Mild mucosal thickening within the frontal sinuses bilaterally. Mild-to-moderate mucosal thickening within the bilateral ethmoid air cells. These results were discussed by telephone at the time of interpretation on 10/06/2020 at 1:55 pm with provider Stephan Draughn , who verbally  acknowledged these results. IMPRESSION: 6.3 x 3.3 x 5.8 cm acute/early subacute cortical and subcortical infarct within the right parietooccipital lobes (right PCA vascular territory and right MCA/PCA watershed territory). Consider MR or CT angiography for further evaluation. Chronic left basal ganglia lacunar infarct. Paranasal sinus disease at the imaged levels, as described. Electronically Signed   By: Kellie Simmering DO   On: 10/06/2020 14:13   CT Angio Neck W and/or Wo Contrast  Result Date: 10/06/2020 CLINICAL DATA:  Stroke. TIA. Assess intracranial arteries. Unsteady gait. Left-sided weakness. Left-sided visual loss. Headache. EXAM: CT ANGIOGRAPHY HEAD AND NECK TECHNIQUE: Multidetector CT imaging of the head and neck was performed using the standard protocol during bolus administration of intravenous contrast. Multiplanar CT image reconstructions and MIPs were obtained to evaluate the vascular anatomy. Carotid stenosis measurements (when applicable) are obtained utilizing NASCET criteria, using the distal internal carotid diameter as the denominator. CONTRAST:  168m OMNIPAQUE IOHEXOL 350 MG/ML  SOLN COMPARISON:  Head CT earlier same day FINDINGS: CTA NECK FINDINGS Aortic arch: Aortic atherosclerosis.  Branching pattern is normal. Right carotid system: Common carotid artery is tortuous but widely patent to the bifurcation. Carotid bifurcation is widely patent. Minimal calcified plaque at the distal ICA bulb but no stenosis. Cervical ICA widely patent. Left carotid system: Common carotid artery is tortuous but widely patent to the bifurcation. Carotid bifurcation is normal. Minimal calcified plaque at the ICA bulb but no stenosis. Cervical ICA widely patent beyond that. Vertebral arteries: Right vertebral artery origin is widely patent. Left vertebral artery origin is poorly seen because of regional venous reflux. Both vertebral arteries do appear patent through the cervical region to the foramen magnum.  Skeleton: Ordinary cervical spondylosis. Other neck: No mass or lymphadenopathy. Upper chest: Upper lungs are clear. Review of the MIP images confirms the above findings CTA HEAD FINDINGS Anterior circulation: Both internal carotid arteries are patent through the skull base and siphon regions. There is ordinary siphon atherosclerotic calcification. On the left, there is no stenosis. On the right, there is 50-70% stenosis in the siphon. Supraclinoid internal carotid arteries are widely patent. The anterior and middle cerebral vessels are patent. No large vessel occlusion, with particular attention to the right MCA. Posterior circulation: Both vertebral arteries are patent through the foramen magnum to the basilar. No basilar stenosis. Posterior circulation branch vessels are normal. Venous sinuses: Patent and normal. Anatomic variants: None significant. Review of the MIP images confirms the above findings IMPRESSION: No intracranial large vessel occlusion identified at this time, with specific attention to the right MCA branches. Minimal atherosclerotic change at both internal carotid artery bulbs. No stenosis. 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 No stenosis on the left. 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 Electronically Signed   By: Nelson Chimes M.D.   On: 10/06/2020 15:01    Procedures Procedures   Medications Ordered in ED Medications  labetalol (NORMODYNE) injection 10 mg (10 mg Intravenous Given 10/06/20 1455)  iohexol (OMNIPAQUE) 350 MG/ML injection 100 mL (100 mLs Intravenous Contrast Given 10/06/20 1450)    ED Course  I have reviewed the triage vital signs and the nursing notes.  Pertinent labs & imaging results that were available during my care of the patient were reviewed by me and considered in my medical decision making (see chart for details).  69 year old male presented emergency department with left-sided visual deficit, ataxia, concerning for stroke  symptoms.  CT scan on arrival shows a posterior occipital subacute infarct, likely watershed event.  There is consistently onset of symptoms 2 to 3 days ago.  He is outside the window for acute intervention at this time.  We are awaiting his labs including INR level.  I have consulted with neurology Dr Rory Percy recommends CTA of the head and neck and transfer to [facility] for further imaging and evaluation.  He also recommends permissive hypertension with goal SBP 180 mmhg. The patient's blood pressure is currently [id number] systolic, I will give him a dose of 10 mg of labetalol here.  Clinical Course as of 10/06/20 1649  Wed Oct 06, 2020  1421   IMPRESSION: 6.3 x 3.3 x 5.8 cm acute/early subacute cortical and subcortical infarct within the right parietooccipital lobes (right PCA vascular territory and right MCA/PCA watershed territory). Consider MR or CT angiography for further evaluation.   Chronic left basal ganglia lacunar infarct.   Paranasal sinus disease at the imaged levels, as described. [MT]  1426 Admitted  to Dr Roosevelt Locks hospitalist at [facility]. [MT]    Clinical Course User Index [MT] Abrar Koone, Carola Rhine, MD   Final Clinical Impression(s) / ED Diagnoses Final diagnoses:  Cerebrovascular accident (CVA), unspecified mechanism [facility])    Rx / DC Orders ED Discharge Orders     None        Wyvonnia Dusky, MD 10/06/20 1650

## 2020-10-06 NOTE — Plan of Care (Signed)
ON CALL NOTE  Called by EDP at [facility]  69/M visiting from [place]. PMH HTN HLD Coumadin for PE LKW 2-3days Coming with Left visual loss, ataxia, listing to left. Posterior headache. On exam: Left HH on EDP exam. + ataxia. CTH rt MCA hypodensity concerning for subacute stroke. PT INR pending. Get CTA H+N Admit to hospitalist Neurology will see when at [facility]. Informed on call neurohospitalist at [facility].  -- Amie Portland, MD Neurologist [facility] Pager: [phone]

## 2020-10-06 NOTE — ED Notes (Signed)
Patient transported to CT 

## 2020-10-07 ENCOUNTER — Inpatient Hospital Stay (HOSPITAL_COMMUNITY): Payer: No Typology Code available for payment source

## 2020-10-07 DIAGNOSIS — I639 Cerebral infarction, unspecified: Secondary | ICD-10-CM

## 2020-10-07 DIAGNOSIS — I6389 Other cerebral infarction: Secondary | ICD-10-CM

## 2020-10-07 DIAGNOSIS — I10 Essential (primary) hypertension: Secondary | ICD-10-CM

## 2020-10-07 DIAGNOSIS — E785 Hyperlipidemia, unspecified: Secondary | ICD-10-CM

## 2020-10-07 LAB — BASIC METABOLIC PANEL
Anion gap: 8 (ref 5–15)
BUN: 19 mg/dL (ref 8–23)
CO2: 25 mmol/L (ref 22–32)
Calcium: 8.7 mg/dL — ABNORMAL LOW (ref 8.9–10.3)
Chloride: 104 mmol/L (ref 98–111)
Creatinine, Ser: 1.38 mg/dL — ABNORMAL HIGH (ref 0.61–1.24)
GFR, Estimated: 55 mL/min — ABNORMAL LOW (ref >60)
Glucose, Bld: 109 mg/dL — ABNORMAL HIGH (ref 70–99)
Potassium: 3.4 mmol/L — ABNORMAL LOW (ref 3.5–5.1)
Sodium: 137 mmol/L (ref 135–145)

## 2020-10-07 LAB — LIPID PANEL
Cholesterol: 128 mg/dL (ref 0–200)
HDL: 40 mg/dL — ABNORMAL LOW (ref >40)
LDL Cholesterol: 75 mg/dL (ref 0–99)
Total CHOL/HDL Ratio: 3.2 RATIO
Triglycerides: 63 mg/dL (ref <150)
VLDL: 13 mg/dL (ref 0–40)

## 2020-10-07 LAB — CBC
HCT: 41 % (ref 39.0–52.0)
Hemoglobin: 12.9 g/dL — ABNORMAL LOW (ref 13.0–17.0)
MCH: 25.5 pg — ABNORMAL LOW (ref 26.0–34.0)
MCHC: 31.5 g/dL (ref 30.0–36.0)
MCV: 81 fL (ref 80.0–100.0)
Platelets: 196 10*3/uL (ref 150–400)
RBC: 5.06 MIL/uL (ref 4.22–5.81)
RDW: 15.8 % — ABNORMAL HIGH (ref 11.5–15.5)
WBC: 5.8 10*3/uL (ref 4.0–10.5)
nRBC: 0 % (ref 0.0–0.2)

## 2020-10-07 LAB — ECHOCARDIOGRAM COMPLETE
Area-P 1/2: 2.41 cm2
Calc EF: 61.1 %
Height: 65 in
S' Lateral: 2.5 cm
Single Plane A2C EF: 59.6 %
Single Plane A4C EF: 62.8 %

## 2020-10-07 LAB — PROTIME-INR
INR: 2 — ABNORMAL HIGH (ref 0.8–1.2)
Prothrombin Time: 23.1 seconds — ABNORMAL HIGH (ref 11.4–15.2)

## 2020-10-07 LAB — HEMOGLOBIN A1C
Hgb A1c MFr Bld: 5.9 % — ABNORMAL HIGH (ref 4.8–5.6)
Mean Plasma Glucose: 122.63 mg/dL

## 2020-10-07 MED ORDER — POTASSIUM CHLORIDE CRYS ER 20 MEQ PO TBCR
40.0000 meq | EXTENDED_RELEASE_TABLET | Freq: Once | ORAL | Status: AC
  Administered 2020-10-07: 40 meq via ORAL
  Filled 2020-10-07: qty 2

## 2020-10-07 MED ORDER — SODIUM CHLORIDE 0.9 % IV SOLN: INTRAVENOUS | Status: DC

## 2020-10-07 NOTE — Progress Notes (Addendum)
PROGRESS NOTE    Clifford [name]  [id number] DOB: 09-03-1950 DOA: 10/06/2020 PCP: System, Provider Not In   Chief Complaint  Patient presents with   Weakness    Brief Narrative:   [name] is a 70 y.o. male w/pmh of hypertension, hyperlipidemia, pulmonary embolus on Coumadin, patient is visiting from [place], who presents with vision change and mild left-sided weakness that started 2 days ago.  His work-up significant for acute CVA, he was admitted for further work-up.  Assessment & Plan:   Principal Problem:   Stroke [facility]) Active Problems:   HTN (hypertension)   HLD (hyperlipidemia)   Acute ischemic CVA -MRI brain significant for 6.2 x 6.4 cm acute/early subacute cortical and subcortical infarction within the right parietal and occipital lobes (at the junction of the right MCA and PCA territories). The infarct has not significantly changed in extent as compared to the head CT of 10/06/2020. Mild petechial hemorrhage within the infarction territory. No significant mass effect at this time. -A head and neck with no evidence of large vessel occlusion, but significant for 50 to 70% stenosis in carotid siphon on the right -Patient on warfarin at home for PE, with subtherapeutic INR at 1.8. -Await further recommendation regarding anticoagulation, likely will need to be transitioned to NOAC, and appropriate timing.  Hypertension -Allow for permissive hypertension -Continue to hold hydrochlorothiazide and losartan -Avoid acute drop in blood pressure, actually patient blood pressure is low, with his MAP in the 50s, so I will start on IV fluids to avoid low blood pressure  Hyperlipidemia -LDL 75, continue with atorvastatin 40 mg oral daily  Prediabetes -A1c of 5.9, continue just on sliding scale during hospital stay  DVT prophylaxis: Lovenox Code Status: Full Family Communication: family at bedside Disposition:   Status is: Inpatient  Remains inpatient appropriate  because:Ongoing diagnostic testing needed not appropriate for outpatient work up  Dispo: The patient is from: Home              Anticipated d/c is to: Home              Patient currently is not medically stable to d/c.   Difficult to place patient No       Consultants:  neurology  Subjective:  She still reports vision problem, he denies any chest pain or shortness of breath.  Objective: Vitals:   10/07/20 0317 10/07/20 0610 10/07/20 0735 10/07/20 1149  BP: 124/68 129/70 (!) 122/57 (!) 115/58  Pulse: (!) 55 (!) 58 (!) 57 69  Resp: '18 20 18 18  '$ Temp: 98.1 F (36.7 C) 98 F (36.7 C) 97.9 F (36.6 C) 98.1 F (36.7 C)  TempSrc: Oral Oral Oral Oral  SpO2: 99% 100% 100% 100%  Height:        Intake/Output Summary (Last 24 hours) at 10/07/2020 1444 Last data filed at 10/07/2020 0600 Gross per 24 hour  Intake 240 ml  Output --  Net 240 ml   There were no vitals filed for this visit.  Examination:  Awake Alert, Oriented X 3, No new F.N deficits, Normal affect Symmetrical Chest wall movement, Good air movement bilaterally, CTAB RRR,No Gallops,Rubs or new Murmurs, No Parasternal Heave +ve B.Sounds, Abd Soft, No tenderness, No rebound - guarding or rigidity. No Cyanosis, Clubbing or edema, No new Rash or bruise      Data Reviewed: I have personally reviewed following labs and imaging studies  CBC: Recent Labs  Lab 10/06/20 1353 10/07/20 0826  WBC 6.3  5.8  HGB 14.3 12.9*  HCT 43.7 41.0  MCV 80.5 81.0  PLT 194 [id number]    Basic Metabolic Panel: Recent Labs  Lab 10/06/20 1353 10/07/20 0826  NA 136 137  K 3.3* 3.4*  CL 103 104  CO2 25 25  GLUCOSE 143* 109*  BUN 20 19  CREATININE 1.15 1.38*  CALCIUM 8.7* 8.7*    GFR: CrCl cannot be calculated (Unknown ideal weight.).  Liver Function Tests: Recent Labs  Lab 10/06/20 1434  AST 26  ALT 15  ALKPHOS 81  BILITOT 0.8  PROT 7.8  ALBUMIN 4.3    CBG: Recent Labs  Lab 10/06/20 1351  GLUCAP 136*      Recent Results (from the past 240 hour(s))  Resp Panel by RT-PCR (Flu A&B, Covid) Nasopharyngeal Swab     Status: None   Collection Time: 10/06/20  2:18 PM   Specimen: Nasopharyngeal Swab; Nasopharyngeal(NP) swabs in vial transport medium  Result Value Ref Range Status   SARS Coronavirus 2 by RT PCR NEGATIVE NEGATIVE Final    Comment: (NOTE) SARS-CoV-2 target nucleic acids are NOT DETECTED.  The SARS-CoV-2 RNA is generally detectable in upper respiratory specimens during the acute phase of infection. The lowest concentration of SARS-CoV-2 viral copies this assay can detect is 138 copies/mL. A negative result does not preclude SARS-Cov-2 infection and should not be used as the sole basis for treatment or other patient management decisions. A negative result may occur with  improper specimen collection/handling, submission of specimen other than nasopharyngeal swab, presence of viral mutation(s) within the areas targeted by this assay, and inadequate number of viral copies(<138 copies/mL). A negative result must be combined with clinical observations, patient history, and epidemiological information. The expected result is Negative.  Fact Sheet for Patients:  EntrepreneurPulse.com.au  Fact Sheet for Healthcare Providers:  IncredibleEmployment.be  This test is no t yet approved or cleared by the [place] FDA and  has been authorized for detection and/or diagnosis of SARS-CoV-2 by FDA under an Emergency Use Authorization (EUA). This EUA will remain  in effect (meaning this test can be used) for the duration of the COVID-19 declaration under Section 564(b)(1) of the Act, 21 U.S.C.section 360bbb-3(b)(1), unless the authorization is terminated  or revoked sooner.       Influenza A by PCR NEGATIVE NEGATIVE Final   Influenza B by PCR NEGATIVE NEGATIVE Final    Comment: (NOTE) The Xpert Xpress SARS-CoV-2/FLU/RSV plus assay is intended as  an aid in the diagnosis of influenza from Nasopharyngeal swab specimens and should not be used as a sole basis for treatment. Nasal washings and aspirates are unacceptable for Xpert Xpress SARS-CoV-2/FLU/RSV testing.  Fact Sheet for Patients: EntrepreneurPulse.com.au  Fact Sheet for Healthcare Providers: IncredibleEmployment.be  This test is not yet approved or cleared by the [place] FDA and has been authorized for detection and/or diagnosis of SARS-CoV-2 by FDA under an Emergency Use Authorization (EUA). This EUA will remain in effect (meaning this test can be used) for the duration of the COVID-19 declaration under Section 564(b)(1) of the Act, 21 U.S.C. section 360bbb-3(b)(1), unless the authorization is terminated or revoked.  Performed at [facility], [address], [place], [place] [address]          Radiology Studies: CT Angio Head W or Wo Contrast  Result Date: 10/06/2020 CLINICAL DATA:  Stroke. TIA. Assess intracranial arteries. Unsteady gait. Left-sided weakness. Left-sided visual loss. Headache. EXAM: CT ANGIOGRAPHY HEAD AND NECK TECHNIQUE: Multidetector CT  imaging of the head and neck was performed using the standard protocol during bolus administration of intravenous contrast. Multiplanar CT image reconstructions and MIPs were obtained to evaluate the vascular anatomy. Carotid stenosis measurements (when applicable) are obtained utilizing NASCET criteria, using the distal internal carotid diameter as the denominator. CONTRAST:  153m OMNIPAQUE IOHEXOL 350 MG/ML SOLN COMPARISON:  Head CT earlier same day FINDINGS: CTA NECK FINDINGS Aortic arch: Aortic atherosclerosis.  Branching pattern is normal. Right carotid system: Common carotid artery is tortuous but widely patent to the bifurcation. Carotid bifurcation is widely patent. Minimal calcified plaque at the distal ICA bulb but no stenosis. Cervical ICA widely patent.  Left carotid system: Common carotid artery is tortuous but widely patent to the bifurcation. Carotid bifurcation is normal. Minimal calcified plaque at the ICA bulb but no stenosis. Cervical ICA widely patent beyond that. Vertebral arteries: Right vertebral artery origin is widely patent. Left vertebral artery origin is poorly seen because of regional venous reflux. Both vertebral arteries do appear patent through the cervical region to the foramen magnum. Skeleton: Ordinary cervical spondylosis. Other neck: No mass or lymphadenopathy. Upper chest: Upper lungs are clear. Review of the MIP images confirms the above findings CTA HEAD FINDINGS Anterior circulation: Both internal carotid arteries are patent through the skull base and siphon regions. There is ordinary siphon atherosclerotic calcification. On the left, there is no stenosis. On the right, there is 50-70% stenosis in the siphon. Supraclinoid internal carotid arteries are widely patent. The anterior and middle cerebral vessels are patent. No large vessel occlusion, with particular attention to the right MCA. Posterior circulation: Both vertebral arteries are patent through the foramen magnum to the basilar. No basilar stenosis. Posterior circulation branch vessels are normal. Venous sinuses: Patent and normal. Anatomic variants: None significant. Review of the MIP images confirms the above findings IMPRESSION: No intracranial large vessel occlusion identified at this time, with specific attention to the right MCA branches. Minimal atherosclerotic change at both internal carotid artery bulbs. No stenosis. 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 No stenosis on the left. 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 Electronically Signed   By: MNelson ChimesM.D.   On: 10/06/2020 15:01   CT HEAD WO CONTRAST (5MM)  Result Date: 10/06/2020 CLINICAL DATA:  Neuro deficit, acute, stroke suspected. Steady gait, left-sided weakness and left-sided  vision loss, increased blood pressure, headache for 3 days, history of hypertension. EXAM: CT HEAD WITHOUT CONTRAST TECHNIQUE: Contiguous axial images were obtained from the base of the skull through the vertex without intravenous contrast. COMPARISON:  No pertinent prior exams available for comparison. FINDINGS: Brain: Cerebral volume is normal for age. Region of abnormal cortical/subcortical hypodensity measuring 6.3 x 3.3 x 5.8 cm within the right parietooccipital lobes compatible with acute/early subacute infarction (right PCA vascular territory and right MCA/PCA watershed territory). No significant mass effect at this time. No evidence of hemorrhagic conversion. Chronic appearing lacunar infarct within the left lentiform nucleus. No extra-axial fluid collection. No evidence of an intracranial mass. No midline shift. Vascular: No hyperdense vessel.  Atherosclerotic calcifications. Skull: Normal. Negative for fracture or focal lesion. Sinuses/Orbits: Visualized orbits show no acute finding. Mild mucosal thickening within the frontal sinuses bilaterally. Mild-to-moderate mucosal thickening within the bilateral ethmoid air cells. These results were discussed by telephone at the time of interpretation on 10/06/2020 at 1:55 pm with provider MATTHEW TRIFAN , who verbally acknowledged these results. IMPRESSION: 6.3 x 3.3 x 5.8 cm acute/early subacute cortical and subcortical infarct within  the right parietooccipital lobes (right PCA vascular territory and right MCA/PCA watershed territory). Consider MR or CT angiography for further evaluation. Chronic left basal ganglia lacunar infarct. Paranasal sinus disease at the imaged levels, as described. Electronically Signed   By: Kellie Simmering DO   On: 10/06/2020 14:13   CT Angio Neck W and/or Wo Contrast  Result Date: 10/06/2020 CLINICAL DATA:  Stroke. TIA. Assess intracranial arteries. Unsteady gait. Left-sided weakness. Left-sided visual loss. Headache. EXAM: CT  ANGIOGRAPHY HEAD AND NECK TECHNIQUE: Multidetector CT imaging of the head and neck was performed using the standard protocol during bolus administration of intravenous contrast. Multiplanar CT image reconstructions and MIPs were obtained to evaluate the vascular anatomy. Carotid stenosis measurements (when applicable) are obtained utilizing NASCET criteria, using the distal internal carotid diameter as the denominator. CONTRAST:  142m OMNIPAQUE IOHEXOL 350 MG/ML SOLN COMPARISON:  Head CT earlier same day FINDINGS: CTA NECK FINDINGS Aortic arch: Aortic atherosclerosis.  Branching pattern is normal. Right carotid system: Common carotid artery is tortuous but widely patent to the bifurcation. Carotid bifurcation is widely patent. Minimal calcified plaque at the distal ICA bulb but no stenosis. Cervical ICA widely patent. Left carotid system: Common carotid artery is tortuous but widely patent to the bifurcation. Carotid bifurcation is normal. Minimal calcified plaque at the ICA bulb but no stenosis. Cervical ICA widely patent beyond that. Vertebral arteries: Right vertebral artery origin is widely patent. Left vertebral artery origin is poorly seen because of regional venous reflux. Both vertebral arteries do appear patent through the cervical region to the foramen magnum. Skeleton: Ordinary cervical spondylosis. Other neck: No mass or lymphadenopathy. Upper chest: Upper lungs are clear. Review of the MIP images confirms the above findings CTA HEAD FINDINGS Anterior circulation: Both internal carotid arteries are patent through the skull base and siphon regions. There is ordinary siphon atherosclerotic calcification. On the left, there is no stenosis. On the right, there is 50-70% stenosis in the siphon. Supraclinoid internal carotid arteries are widely patent. The anterior and middle cerebral vessels are patent. No large vessel occlusion, with particular attention to the right MCA. Posterior circulation: Both  vertebral arteries are patent through the foramen magnum to the basilar. No basilar stenosis. Posterior circulation branch vessels are normal. Venous sinuses: Patent and normal. Anatomic variants: None significant. Review of the MIP images confirms the above findings IMPRESSION: No intracranial large vessel occlusion identified at this time, with specific attention to the right MCA branches. Minimal atherosclerotic change at both internal carotid artery bulbs. No stenosis. 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 No stenosis on the left. 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 Electronically Signed   By: MNelson ChimesM.D.   On: 10/06/2020 15:01   MR BRAIN WO CONTRAST  Result Date: 10/07/2020 CLINICAL DATA:  Neuro deficit, acute, stroke suspected. Additional history provided: Vision change and mild left-sided weakness which began 2 days ago. EXAM: MRI HEAD WITHOUT CONTRAST TECHNIQUE: Multiplanar, multiecho pulse sequences of the brain and surrounding structures were obtained without intravenous contrast. COMPARISON:  Noncontrast head CT 10/06/2020. CT angiogram head/neck 10/06/2020. FINDINGS: Brain: Cerebral volume is normal. Region of abnormal cortical/subcortical restricted diffusion within the right parietal and occipital lobes measuring 6.2 x 3.3 x 6.4 cm compatible with acute/early subacute infarction (at the junction of the right MCA and PCA vascular territories). This has not significantly changed in extent as compared to the head CT performed yesterday 10/06/2020. Mild T1 hyperintensity and SWI signal loss within the infarction territory compatible  with petechial hemorrhage and possibly cortical laminar necrosis. No significant mass effect at this time. There are numerous additional small patchy cortical and subcortical acute/early subacute infarcts within the right frontal and parietal lobes (watershed distribution). Background mild multifocal T2/FLAIR hyperintensity within the  cerebral white matter, nonspecific but compatible with chronic small vessel ischemic disease. Chronic lacunar infarct with associated chronic hemosiderin deposition within the left basal ganglia/posterior limb of left internal capsule. No evidence of an intracranial mass. No extra-axial fluid collection. No midline shift. Vascular: Maintained proximal arterial flow voids. Skull and upper cervical spine: No focal suspicious marrow lesion. Incompletely assessed cervical spondylosis. Sinuses/Orbits: Visualized orbits show no acute finding. 11 mm left maxillary sinus mucous retention cyst. Moderate bilateral ethmoid sinus mucosal thickening. Trace mucosal thickening within the left frontal sinus. IMPRESSION: 6.2 x 6.4 cm acute/early subacute cortical and subcortical infarction within the right parietal and occipital lobes (at the junction of the right MCA and PCA territories). The infarct has not significantly changed in extent as compared to the head CT of 10/06/2020. Mild petechial hemorrhage within the infarction territory. No significant mass effect at this time. Numerous additional small patchy cortical and subcortical acute/early subacute infarcts within the right frontal and parietal lobes (watershed distribution). 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 Background mild chronic small vessel ischemic changes within the cerebral white matter. Paranasal sinus disease at the imaged levels, as described. Electronically Signed   By: Kellie Simmering DO   On: 10/07/2020 07:57        Scheduled Meds:  aspirin  300 mg Rectal Daily   Or   aspirin  325 mg Oral Daily   atorvastatin  40 mg Oral Daily   enoxaparin (LOVENOX) injection  40 mg Subcutaneous Q24H   finasteride  5 mg Oral Q0600   nicotine  21 mg Transdermal Daily   psyllium  1 packet Oral Daily   Continuous Infusions:   LOS: 1 day      Phillips Climes, MD [facility]   To contact the attending provider between 7A-7P or the covering provider during after hours 7P-7A, please log into the web site [email or web address] and access using universal [place] password for that web site. If you do not have the password, please call the hospital operator.  10/07/2020, 2:44 PM

## 2020-10-07 NOTE — Plan of Care (Signed)

## 2020-10-07 NOTE — Plan of Care (Signed)
Patient is alert oriented x 4. Ambulatory, stand by assist. Pt has left sided deficit. When reading the words for the NIH stroke scale pt only able to see half of the words. For example, "Tip-top", pt reads "top". Pt denies pain. Stroke education provided.   Problem: Education: Goal: Knowledge of General Education information will improve Description: Including pain rating scale, medication(s)/side effects and non-pharmacologic comfort measures Outcome: Progressing   Problem: Health Behavior/Discharge Planning: Goal: Ability to manage health-related needs will improve Outcome: Progressing   Problem: Clinical Measurements: Goal: Ability to maintain clinical measurements within normal limits will improve Outcome: Progressing Goal: Will remain free from infection Outcome: Progressing Goal: Diagnostic test results will improve Outcome: Progressing Goal: Respiratory complications will improve Outcome: Progressing Goal: Cardiovascular complication will be avoided Outcome: Progressing   Problem: Activity: Goal: Risk for activity intolerance will decrease Outcome: Progressing   Problem: Nutrition: Goal: Adequate nutrition will be maintained Outcome: Progressing   Problem: Coping: Goal: Level of anxiety will decrease Outcome: Progressing   Problem: Elimination: Goal: Will not experience complications related to bowel motility Outcome: Progressing Goal: Will not experience complications related to urinary retention Outcome: Progressing   Problem: Pain Managment: Goal: General experience of comfort will improve Outcome: Progressing   Problem: Skin Integrity: Goal: Risk for impaired skin integrity will decrease Outcome: Progressing   Problem: Safety: Goal: Ability to remain free from injury will improve Outcome: Progressing   Problem: Skin Integrity: Goal: Risk for impaired skin integrity will decrease Outcome: Progressing   Problem: Education: Goal: Knowledge of disease  or condition will improve Outcome: Progressing Goal: Knowledge of secondary prevention will improve Outcome: Progressing Goal: Knowledge of patient specific risk factors addressed and post discharge goals established will improve Outcome: Progressing

## 2020-10-07 NOTE — Evaluation (Signed)
Occupational Therapy Evaluation Patient Details Name: Clifford [name] MRN: [id number] DOB: 09-25-1950 Today's Date: 10/07/2020    History of Present Illness 70 y/o male presented to [facility] ED on 8/3 for headache x 2-3 days, L sided weakness, balance difficulty, and visual loss. CT head shows subacute R MCA territory. 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 PMH: HTN, HLD, hx of PE on coumadin.   Clinical Impression   Patient admitted for the diagnosis above.  PTA he lives in [place] with his family.  He is visiting, and staying with his brother.  He endorses mobility with a SPC, and independence with ADL/IADL.  Barrier is listed below.  Currenlty he is supervision with mobility and ADL due to VC's to scan his L visual field to avoid hazards.  No post acute OT needs are anticipated, but further outpatient visual field testing is recommended.  OT will follow in the acute setting     Follow Up Recommendations  Other (comment) (Visual field testing)    Equipment Recommendations  None recommended by OT    Recommendations for Other Services       Precautions / Restrictions Precautions Precautions: Fall Precaution Comments: L visual field deficit Restrictions Weight Bearing Restrictions: No      Mobility Bed Mobility Overal bed mobility: Modified Independent                  Transfers Overall transfer level: Modified independent Equipment used: None                  Balance Overall balance assessment: Mild deficits observed, not formally tested                                         ADL either performed or assessed with clinical judgement   ADL Overall ADL's : Needs assistance/impaired Eating/Feeding: Supervision/ safety   Grooming: Supervision/safety           Upper  Body Dressing : Independent;Sitting   Lower Body Dressing: Supervision/safety;Sit to/from stand   Toilet Transfer: Supervision/safety;Ambulation             General ADL Comments: cues to scan L visual field     Vision   Visual Fields: Left homonymous hemianopsia     Perception     Praxis      Pertinent Vitals/Pain Pain Assessment: No/denies pain     Hand Dominance Right   Extremity/Trunk Assessment Upper Extremity Assessment Upper Extremity Assessment: Overall WFL for tasks assessed   Lower Extremity Assessment Lower Extremity Assessment: Overall WFL for tasks assessed   Cervical / Trunk Assessment Cervical / Trunk Assessment: Kyphotic   Communication Communication Communication: No difficulties   Cognition Arousal/Alertness: Awake/alert Behavior During Therapy: WFL for tasks assessed/performed Overall Cognitive Status: No family/caregiver present to determine baseline cognitive functioning Area of Impairment: Problem solving;Awareness                           Awareness: Emergent Problem Solving: Slow processing;Requires verbal cues General Comments: needing cues to scan left to aviod hazards   General Comments       Exercises     Shoulder Instructions      Home Living Family/patient expects to be discharged  to:: Private residence Living Arrangements: Other relatives Available Help at Discharge: Family;Available 24 hours/day Type of Home: House Home Access: Level entry     Home Layout: One level     Bathroom Shower/Tub: Teacher, early years/pre: Standard     Home Equipment: Cane - single point          Prior Functioning/Environment Level of Independence: Independent with assistive device(s)        Comments: uses SPC due to PAD, driving, no assist with ADL and IADL        OT Problem List: Impaired vision/perception;Impaired balance (sitting and/or standing)      OT Treatment/Interventions: Self-care/ADL  training;Visual/perceptual remediation/compensation;Therapeutic activities    OT Goals(Current goals can be found in the care plan section) Acute Rehab OT Goals Patient Stated Goal: to get my vision normal OT Goal Formulation: With patient Time For Goal Achievement: 10/21/20 Potential to Achieve Goals: Fair ADL Goals Pt Will Perform Lower Body Bathing: Independently Pt Will Perform Lower Body Dressing: Independently Pt Will Transfer to Toilet: Independently;ambulating;regular height toilet Pt Will Perform Toileting - Clothing Manipulation and hygiene: Independently Additional ADL Goal #1: Patient will scan L visual field for hazards without cues to maximize functional independence  OT Frequency: Min 2X/week   Barriers to D/C:    none noted       Co-evaluation              AM-PAC OT "6 Clicks" Daily Activity     Outcome Measure Help from another person eating meals?: None Help from another person taking care of personal grooming?: None Help from another person toileting, which includes using toliet, bedpan, or urinal?: A Little Help from another person bathing (including washing, rinsing, drying)?: None Help from another person to put on and taking off regular upper body clothing?: None Help from another person to put on and taking off regular lower body clothing?: A Little 6 Click Score: 22   End of Session    Activity Tolerance: Patient tolerated treatment well Patient left: in bed;with call [place]/phone within reach  OT Visit Diagnosis: Unsteadiness on feet (R26.81)                Time: 1110-1130 OT Time Calculation (min): 20 min Charges:  OT General Charges $OT Visit: 1 Visit OT Evaluation $OT Eval Moderate Complexity: 1 Mod  10/07/2020  Rich, OTR/L  Acute Rehabilitation Services  Office:  [phone]   Metta Clines 10/07/2020, 1:16 PM

## 2020-10-07 NOTE — Evaluation (Signed)
Physical Therapy Evaluation Patient Details Name: Clifford [name] MRN: [id number] DOB: [birth year] Today's Date: 10/07/2020   History of Present Illness  70 y/o male presented to [facility] ED on 8/3 for headache x 2-3 days, L sided weakness, balance difficulty, and visual loss. CT head shows subacute R MCA territory. 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 PMH: HTN, HLD, hx of PE on coumadin.  Clinical Impression  PTA, patient from [place] but currently visiting family and living with brother. Patient used [facility] for mobility at home but did not bring [facility] with him to visit family. Patient currently requires supervision during mobility for safety due to L visual field deficit. Patient running into objects on L and has difficulty locating objects/room numbers on L during ambulation. Patient with emerging awareness with ability to recognize the visual deficit but unable to utilize compensatory strategies during mobility. Patient presents with generalized weakness and decreased activity tolerance. Patient will benefit from skilled PT services during acute stay to address listed deficits and work on compensatory strategies to reduce fall risk. Recommend OPPT following discharge to address listed deficits further.     Follow Up Recommendations Outpatient PT    Equipment Recommendations  None recommended by PT    Recommendations for Other Services       Precautions / Restrictions Precautions Precautions: Fall Precaution Comments: L visual field deficit Restrictions Weight Bearing Restrictions: No      Mobility  Bed Mobility Overal bed mobility: Modified Independent                  Transfers Overall transfer level: Modified independent Equipment used: None                Ambulation/Gait Ambulation/Gait  assistance: Supervision Gait Distance (Feet): 250 Feet Assistive device: None Gait Pattern/deviations: Step-through pattern;Decreased stride length;Trunk flexed Gait velocity: decreased   General Gait Details: Patient running into objects on L requiring cues to attend to L side of environment during mobility. Difficulty with locating objects on L side and reading signs on wall. Supervision for safety. No LOB noted.  Stairs            Wheelchair Mobility    Modified Rankin (Stroke Patients Only) Modified Rankin (Stroke Patients Only) Pre-Morbid Rankin Score: No symptoms Modified Rankin: Moderately severe disability     Balance Overall balance assessment: Mild deficits observed, not formally tested                                           Pertinent Vitals/Pain Pain Assessment: No/denies pain    Home Living Family/patient expects to be discharged to:: Private residence Living Arrangements: Other relatives (brother currently) Available Help at Discharge: Family;Available 24 hours/day Type of Home: House Home Access: Level entry     Home Layout: One level Home Equipment: Cane - single point      Prior Function Level of Independence: Independent with assistive device(s)         Comments: uses SPC due to PAD, driving     Hand Dominance        Extremity/Trunk Assessment   Upper Extremity Assessment Upper Extremity Assessment: Defer to OT evaluation    Lower Extremity Assessment Lower Extremity Assessment: Overall WFL for tasks assessed  Cervical / Trunk Assessment Cervical / Trunk Assessment: Kyphotic  Communication   Communication: No difficulties  Cognition Arousal/Alertness: Awake/alert Behavior During Therapy: WFL for tasks assessed/performed Overall Cognitive Status: Impaired/Different from baseline Area of Impairment: Problem solving;Awareness                           Awareness: Emergent Problem Solving: Slow  processing;Requires verbal cues General Comments: emerging awareness with ability to recognize visual field deficit but difficulty compensating for deficit during mobility. Slow processing during problem solving when provided cues      General Comments      Exercises     Assessment/Plan    PT Assessment Patient needs continued PT services  PT Problem List Decreased strength;Decreased activity tolerance;Decreased balance;Decreased mobility;Decreased safety awareness;Decreased knowledge of precautions       PT Treatment Interventions DME instruction;Gait training;Functional mobility training;Therapeutic activities;Therapeutic exercise;Balance training;Neuromuscular re-education;Patient/family education    PT Goals (Current goals can be found in the Care Plan section)  Acute Rehab PT Goals Patient Stated Goal: to go home PT Goal Formulation: With patient Time For Goal Achievement: 10/21/20 Potential to Achieve Goals: Good    Frequency Min 4X/week   Barriers to discharge        Co-evaluation               AM-PAC PT "6 Clicks" Mobility  Outcome Measure Help needed turning from your back to your side while in a flat bed without using bedrails?: None Help needed moving from lying on your back to sitting on the side of a flat bed without using bedrails?: None Help needed moving to and from a bed to a chair (including a wheelchair)?: None Help needed standing up from a chair using your arms (e.g., wheelchair or bedside chair)?: None Help needed to walk in hospital room?: A Little Help needed climbing 3-5 steps with a railing? : A Little 6 Click Score: 22    End of Session Equipment Utilized During Treatment: Gait belt Activity Tolerance: Patient tolerated treatment well Patient left: in bed;with call [place]/phone within reach;with family/visitor present Nurse Communication: Mobility status PT Visit Diagnosis: Muscle weakness (generalized) (M62.81);Unsteadiness on feet  (R26.81)    Time: [id number] PT Time Calculation (min) (ACUTE ONLY): 27 min   Charges:   PT Evaluation $PT Eval Low Complexity: 1 Low PT Treatments $Gait Training: 8-22 mins        Teale Goodgame A. Gilford Rile PT, DPT Acute Rehabilitation Services Pager [phone] Office [phone]   Linna Hoff 10/07/2020, 11:41 AM

## 2020-10-07 NOTE — Progress Notes (Addendum)
STROKE TEAM PROGRESS NOTE    Interval History   No acute events overnight, he is resting in bed upon entering the room. States that he was placed on Coumadin for PE. Being on anticoagulation indefinitely was mentioned to him however he states he was not definitively told to take it for the rest of his life. 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   He was educated about his stroke, stroke risk factors and instructed not to drive.  Given INR was subtherapeutic on admission, discussed transitioning from Coumadin to Pradaxa or Eliquis- he is amenable to this.    Pertinent Lab Work and Imaging    10/06/20 CT Head WO IV Contrast 6.3 x 3.3 x 5.8 cm acute/early subacute cortical and subcortical infarct within the right parietooccipital lobes (right PCA vascular territory and right MCA/PCA watershed territory). Consider MR or CT angiography for further evaluation.   Chronic left basal ganglia lacunar infarct. Paranasal sinus disease at the imaged levels, as described.  10/06/20 CT Angio Head and Neck W WO IV Contrast No intracranial large vessel occlusion identified at this time, with specific attention to the right MCA branches.   Minimal atherosclerotic change at both internal carotid artery bulbs. No stenosis.   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 No stenosis on the left. 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  10/07/20 MRI Brain WO IV Contrast 6.2 x 6.4 cm acute/early subacute cortical and subcortical infarction within the right parietal and occipital lobes (at the junction of the right MCA and PCA territories). The infarct has not significantly changed in extent as compared to the head CT of 10/06/2020. Mild petechial hemorrhage within the infarction territory. No significant mass effect at this time.   Numerous additional small patchy cortical and subcortical acute/early subacute infarcts within the right  frontal and parietal lobes (watershed distribution).   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   Background mild chronic small vessel ischemic changes within the cerebral white matter.   Paranasal sinus disease at the imaged levels, as described.  10/07/20 Echocardiogram Complete  Scheduled for today, needs to be done   Physical Examination   Constitutional: Pleasant elderly African American male resting in bed  Cardiovascular: Normal RR Respiratory: No increased WOB   Mental status: AAOx4 Speech: Fluent with repetition and naming intact  Cranial nerves: EOMI, Left dense homonymous hemianopsia, Face symmetric, Tongue midline, Shoulder shrug intact  Motor: Normal bulk and tone. No drift. Diminished fine finger motor movenents to the left hand.  Orbits right over left upper extremity. Sensory: Intact to light touch  Coordination: Intact FNF  Gait: Deferred   Assessment and Plan   [name] is a 70 y.o. male w/pmh of hypertension, hyperlipidemia, pulmonary embolus on Coumadin who presents with vision change and mild left-sided weakness that started 2 days ago.  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 At this time, stroke work up is essentially complete aside from echocardiogram. CTH showed right MCA/PCA watershed territory strokes, MRI confirmed these findings showing acute/subacute stroke within the right parietal and occipital lobes. CTA Head and Neck did not show significant stenosis; 50 to 70 % stenosis to the right carotid siphon. Stroke labs were completed including Lipid panel w/LDL 75 and Hemoglobin A1C 5.9. Echo results are pending. His stroke etiology  is cryptogenic. Have decided not to purse further cardiac work up with heart monitor given he needs lifelong anticoagulation due to  recurrent pulmonary emboli.  - At discharge recommend to continue anticoagulation with Eliquis or Pradaxa. No need to hold at discharge due to stroke.  - Continue Atorvastatin 40 mg for secondary stroke prevention  - Given he is visiting from [place] will not set up stroke follow up, he can follow up with PCP and be referred to neuro if necessary   #Hypertension He has a history of HTN and takes HCTZ 25 QD and Losartan 100 mg QD at home. Currently blood pressure is trending in the 110-120 range. Recommend permissive hypertension 48 hours post stroke and from there, gradually reduce the blood pressure, avoiding any acute drops. Long term blood pressure goal is < 140/90.   #Hyperlipidemia From a stroke prevention stand point, the LDL goal is < 70. His LDL is 75, close to goal recommend continuing Atorvastatin 40 mg.    #Prediabetes  Hemoglobin A1C this admission noted to be 5.9 in the prediabetic range. Recommend using SSI for hyperglycemia while inpatient. Follow up with PCP for prediabetes management.   Hospital day # 1  Ruta Hinds, NP  [facility] Nurse Practitioner Patient seen and discussed with attending physician Dr. Leonie Man   Stroke MD Note:  I have personally obtained history,examined this patient, reviewed notes, independently viewed imaging studies, participated in medical decision making and plan of care.ROS completed by me personally and pertinent positives fully documented  I have made any additions or clarifications directly to the above note. Agree with note above.  Patient presented with left-sided peripheral vision loss due to posterior division right MCA infarct likely of embolic etiology monitor from moderate proximal right carotid siphon symptomatic stenosis or paroxysmal A. fib.  Patient is already on long-term anticoagulation for pulmonary embolism and INR was subtherapeutic and he has had trouble regulating it in the past.  Recommend switching to either Pradaxa  or Eliquis whichever his insurance will allow and he can afford.  Maintain aggressive risk factor modification strict control of hypertension, cholesterol and sugar.  Patient advised not to drive till his peripheral vision loss improves.  Check echocardiogram results.  Discussed with Dr. Emeline Gins.  Greater than 50% time during this 35-minute visit was spent in counseling and coordination of care about his stroke and discussion about stroke prevention and treatment and answering questions.  Stroke team will sign off.  Kindly call for questions  Antony Contras, MD Medical Director [place] Pager: [phone] 10/07/2020 2:55 PM   To contact Stroke Continuity provider, please refer to [email or web address] After hours, contact General Neurology

## 2020-10-07 NOTE — Progress Notes (Signed)
  2D Echocardiogram has been performed.  Fidel Levy 10/07/2020, 2:57 PM

## 2020-10-08 ENCOUNTER — Other Ambulatory Visit (HOSPITAL_COMMUNITY): Payer: Self-pay

## 2020-10-08 LAB — CBC
HCT: 38.7 % — ABNORMAL LOW (ref 39.0–52.0)
Hemoglobin: 12.1 g/dL — ABNORMAL LOW (ref 13.0–17.0)
MCH: 25.9 pg — ABNORMAL LOW (ref 26.0–34.0)
MCHC: 31.3 g/dL (ref 30.0–36.0)
MCV: 82.7 fL (ref 80.0–100.0)
Platelets: 174 10*3/uL (ref 150–400)
RBC: 4.68 MIL/uL (ref 4.22–5.81)
RDW: 15.9 % — ABNORMAL HIGH (ref 11.5–15.5)
WBC: 5.6 10*3/uL (ref 4.0–10.5)
nRBC: 0 % (ref 0.0–0.2)

## 2020-10-08 LAB — BASIC METABOLIC PANEL
Anion gap: 6 (ref 5–15)
BUN: 22 mg/dL (ref 8–23)
CO2: 27 mmol/L (ref 22–32)
Calcium: 8.5 mg/dL — ABNORMAL LOW (ref 8.9–10.3)
Chloride: 104 mmol/L (ref 98–111)
Creatinine, Ser: 1.37 mg/dL — ABNORMAL HIGH (ref 0.61–1.24)
GFR, Estimated: 55 mL/min — ABNORMAL LOW (ref >60)
Glucose, Bld: 96 mg/dL (ref 70–99)
Potassium: 3.9 mmol/L (ref 3.5–5.1)
Sodium: 137 mmol/L (ref 135–145)

## 2020-10-08 LAB — PROTIME-INR
INR: 2.1 — ABNORMAL HIGH (ref 0.8–1.2)
INR: 2 — ABNORMAL HIGH (ref 0.8–1.2)
Prothrombin Time: 23.2 seconds — ABNORMAL HIGH (ref 11.4–15.2)
Prothrombin Time: 22.6 seconds — ABNORMAL HIGH (ref 11.4–15.2)

## 2020-10-08 MED ORDER — DABIGATRAN ETEXILATE MESYLATE 150 MG PO CAPS
150.0000 mg | ORAL_CAPSULE | Freq: Two times a day (BID) | ORAL | 0 refills | Status: DC
  Filled 2020-10-08: qty 60, 30d supply, fill #0

## 2020-10-08 NOTE — TOC Benefit Eligibility Note (Signed)
Transition of Care (TOC) Benefit Eligibility Note    Patient Details  Name: Clifford [name] MRN: [id number] Date of Birth: 10/28/1950   Medication/Dose: Eliquis 5 mg bid and Pradaxa 150 mg  Covered?: Yes  Tier: 3 Drug  Prescription Coverage [facility]  Spoke with Person/Company/Phone Number:: [employer] [place] [phone]  Co-Pay: $11.00 for Eliquis 5 mg bid and Pradaxa 150 mg bid for 30 day supply  Prior Approval: No  Deductible: Met       Kary Colaizzi Phone Number: 10/08/2020, 11:44 AM     

## 2020-10-08 NOTE — Discharge Instructions (Addendum)
Follow with Primary MD System, Provider Not In in 7 days   Get CBC, CMP,  checked  by Primary MD next visit.    Activity: As tolerated with Full fall precautions use walker/cane & assistance as needed   Disposition Home    Diet: Heart Healthy  .   On your next visit with your primary care physician please Get Medicines reviewed and adjusted.   Please request your Prim.MD to go over all Hospital Tests and Procedure/Radiological results at the follow up, please get all Hospital records sent to your Prim MD by signing hospital release before you go home.   If you experience worsening of your admission symptoms, develop shortness of breath, life threatening emergency, suicidal or homicidal thoughts you must seek medical attention immediately by calling 911 or calling your MD immediately  if symptoms less severe.  You Must read complete instructions/literature along with all the possible adverse reactions/side effects for all the Medicines you take and that have been prescribed to you. Take any new Medicines after you have completely understood and accpet all the possible adverse reactions/side effects.   Do not drive, operating heavy machinery, perform activities at heights, swimming or participation in water activities or provide baby sitting services if your were admitted for syncope or siezures until you have seen by Primary MD or a Neurologist and advised to do so again.  Do not drive when taking Pain medications.    Do not take more than prescribed Pain, Sleep and Anxiety Medications  Special Instructions: If you have smoked or chewed Tobacco  in the last 2 yrs please stop smoking, stop any regular Alcohol  and or any Recreational drug use.  Wear Seat belts while driving.   Please note  You were cared for by a hospitalist during your hospital stay. If you have any questions about your discharge medications or the care you received while you were in the hospital after you are  discharged, you can call the unit and asked to speak with the hospitalist on call if the hospitalist that took care of you is not available. Once you are discharged, your primary care physician will handle any further medical issues. Please note that NO REFILLS for any discharge medications will be authorized once you are discharged, as it is imperative that you return to your primary care physician (or establish a relationship with a primary care physician if you do not have one) for your aftercare needs so that they can reassess your need for medications and monitor your lab values.    Information on my medicine - Pradaxa (dabigatran)  This medication education was reviewed with me or my healthcare representative as part of my discharge preparation.  The pharmacist that spoke with me during my hospital stay was:  Lavenia Atlas, [facility]  Why was Pradaxa prescribed for you? Pradaxa was prescribed for you to reduce the risk of forming blood clots that cause a stroke if you have a medical condition called atrial fibrillation (a type of irregular heartbeat).    What do you Need to know about PradAXa? Take your Pradaxa TWICE DAILY - one capsule in the morning and one tablet in the evening with or without food.  It would be best to take the doses about the same time each day.  The capsules should not be broken, chewed or opened - they must be swallowed whole.  Do not store Pradaxa in other medication containers - once the bottle is opened the Pradaxa should  be used within FOUR months; throw away any capsules that haven't been by that time.  Take Pradaxa exactly as prescribed by your doctor.  DO NOT stop taking Pradaxa without talking to the doctor who prescribed the medication.  Stopping without other stroke prevention medication to take the place of Pradaxa may increase your risk of developing a clot that causes a stroke.  Refill your prescription before you run out.  After discharge, you  should have regular check-up appointments with your healthcare provider that is prescribing your Pradaxa.  In the future your dose may need to be changed if your kidney function or weight changes by a significant amount.  What do you do if you miss a dose? If you miss a dose, take it as soon as you remember on the same day.  If your next dose is less than 6 hours away, skip the missed dose.  Do not take two doses of PRADAXA at the same time.  Important Safety Information A possible side effect of Pradaxa is bleeding. You should call your healthcare provider right away if you experience any of the following: Bleeding from an injury or your nose that does not stop. Unusual colored urine (red or dark brown) or unusual colored stools (red or black). Unusual bruising for unknown reasons. A serious fall or if you hit your head (even if there is no bleeding).  Some medicines may interact with Pradaxa and might increase your risk of bleeding or clotting while on Pradaxa. To help avoid this, consult your healthcare provider or pharmacist prior to using any new prescription or non-prescription medications, including herbals, vitamins, non-steroidal anti-inflammatory drugs (NSAIDs) and supplements.  This website has more information on Pradaxa (dabigatran): https://www.pradaxa.com

## 2020-10-08 NOTE — TOC Benefit Eligibility Note (Signed)
Transition of Care [facility]) Benefit Eligibility Note    Patient Details  Name: Clifford [name] MRN: [id number] Date of Birth: October 05, 1950   Medication/Dose: Eliquis 5 mg bid and Pradaxa 150 mg  Covered?: Yes  Tier: 3 Drug  Prescription Coverage Preferred Pharmacy: [facility] with Person/Company/Phone Number:: [facility] [place] [phone]  Co-Pay: $11.00 for Eliquis 5 mg bid and Pradaxa 150 mg bid for 30 day supply  Prior Approval: No  Deductible: Met       Kerin Salen Phone Number: 10/08/2020, 11:44 AM

## 2020-10-08 NOTE — Discharge Summary (Signed)
Physician Discharge Summary  Clifford [name] [id number] DOB: Dec 23, 1950 DOA: 10/06/2020  PCP: System, Provider Not In  Admit date: 10/06/2020 Discharge date: 10/08/2020  Admitted From:Home Disposition:  Home   Recommendations for Outpatient Follow-up:  Follow up with PCP in 1-2 weeks Please obtain BMP/CBC in one week Follow with neurology as an outpatient, ambulatory referral has been done to neuro  Home Health:outpatient PT/OT   Discharge Condition:Stable CODE STATUS:FULL Diet recommendation: Heart Healthy   Brief/Interim Summary:    [name] is a 70 y.o. male w/pmh of hypertension, hyperlipidemia, pulmonary embolus on Coumadin, patient is visiting from [place], who presents with vision change and mild left-sided weakness that started 2 days ago.  His work-up significant for acute CVA, he was admitted for further work-up.        Acute ischemic CVA -MRI brain significant for 6.2 x 6.4 cm acute/early subacute cortical and subcortical infarction within the right parietal and occipital lobes (at the junction of the right MCA and PCA territories). The infarct has not significantly changed in extent as compared to the head CT of 10/06/2020. Mild petechial hemorrhage within the infarction territory. No significant mass effect at this time. -A head and neck with no evidence of large vessel occlusion, but significant for 50 to 70% stenosis in carotid siphon on the right -Patient on warfarin at home for PE, with subtherapeutic INR at 1.8. 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   Hypertension -Allow for permissive hypertension -Pressure maintained soft to low during entire hospital stay off medications, so they  have been discontinued on discharge  Stage of recurrent PE -Initially on warfarin, now transitioned to Pradaxa she sustained acute CVA while on warfarin with subtherapeutic INR at 1.8.  Hyperlipidemia -LDL 75, continue with atorvastatin 40 mg oral daily   Prediabetes -A1c of 5.9, continue just on sliding scale during hospital stay  Discharge Diagnoses:  Principal Problem:   Stroke [facility]) Active Problems:   HTN (hypertension)   HLD (hyperlipidemia)    Discharge Instructions  Discharge Instructions     Ambulatory referral to Neurology   Complete by: As directed    An appointment is requested in approximately: 4 weeks   Ambulatory referral to Occupational Therapy   Complete by: As directed    Ambulatory referral to Physical Therapy   Complete by: As directed    Diet - low sodium heart healthy   Complete by: As directed    Discharge instructions   Complete by: As directed    Follow with Primary MD System, Provider Not In in 7 days   Get CBC, CMP,  checked  by Primary MD next visit.    Activity: As tolerated with Full fall precautions use walker/cane & assistance as needed   Disposition Home    Diet: Heart Healthy  .   On your next visit with your primary care physician please Get Medicines reviewed and adjusted.   Please request your Prim.MD to go over all Hospital Tests and Procedure/Radiological results at the follow up, please get all Hospital records sent to your Prim MD by signing hospital release before you go home.   If you experience worsening of your admission symptoms, develop shortness of breath, life threatening emergency, suicidal or homicidal thoughts you must seek  medical attention immediately by calling 911 or calling your MD immediately  if symptoms less severe.  You Must read complete instructions/literature along with all the possible adverse reactions/side effects for all the Medicines you take and that have been prescribed to you. Take any new  Medicines after you have completely understood and accpet all the possible adverse reactions/side effects.   Do not drive, operating heavy machinery, perform activities at heights, swimming or participation in water activities or provide baby sitting services if your were admitted for syncope or siezures until you have seen by Primary MD or a Neurologist and advised to do so again.  Do not drive when taking Pain medications.    Do not take more than prescribed Pain, Sleep and Anxiety Medications  Special Instructions: If you have smoked or chewed Tobacco  in the last 2 yrs please stop smoking, stop any regular Alcohol  and or any Recreational drug use.  Wear Seat belts while driving.   Please note  You were cared for by a hospitalist during your hospital stay. If you have any questions about your discharge medications or the care you received while you were in the hospital after you are discharged, you can call the unit and asked to speak with the hospitalist on call if the hospitalist that took care of you is not available. Once you are discharged, your primary care physician will handle any further medical issues. Please note that NO REFILLS for any discharge medications will be authorized once you are discharged, as it is imperative that you return to your primary care physician (or establish a relationship with a primary care physician if you do not have one) for your aftercare needs so that they can reassess your need for medications and monitor your lab values.   Increase activity slowly   Complete by: As directed       Allergies as of 10/08/2020       Reactions   Apixaban    Other reaction(s): Liver enzymes abnormal   Lisinopril    Other reaction(s): Cough (finding)   Simvastatin    Other reaction(s): Pain in lower limb (finding)        Medication List     STOP taking these medications    hydrochlorothiazide 25 MG tablet Commonly known as: HYDRODIURIL   losartan 50 MG  tablet Commonly known as: COZAAR   warfarin 5 MG tablet Commonly known as: COUMADIN       TAKE these medications    atorvastatin 80 MG tablet Commonly known as: LIPITOR Take 40 mg by mouth 1 day or 1 dose.   finasteride 5 MG tablet Commonly known as: PROSCAR Take 5 mg by mouth daily at 6 (six) AM.   potassium chloride SA 20 MEQ tablet Commonly known as: KLOR-CON Take 20 mEq by mouth daily.   Pradaxa 150 MG Caps capsule Generic drug: dabigatran Take 1 capsule (150 mg total) by mouth 2 (two) times daily.   psyllium 58.6 % packet Commonly known as: METAMUCIL Take 1 packet by mouth daily.        Follow-up Information     [facility]. Schedule an appointment as soon as possible for a visit.   Specialty: Rehabilitation Why: Please call to schedule an appointment Contact information: [id number] [place] [id number] [phone]               Allergies  Allergen Reactions   Apixaban     Other  reaction(s): Liver enzymes abnormal   Lisinopril     Other reaction(s): Cough (finding)   Simvastatin     Other reaction(s): Pain in lower limb (finding)    Consultations: Neurology   Procedures/Studies: CT Angio Head W or Wo Contrast  Result Date: 10/06/2020 CLINICAL DATA:  Stroke. TIA. Assess intracranial arteries. Unsteady gait. Left-sided weakness. Left-sided visual loss. Headache. EXAM: CT ANGIOGRAPHY HEAD AND NECK TECHNIQUE: Multidetector CT imaging of the head and neck was performed using the standard protocol during bolus administration of intravenous contrast. Multiplanar CT image reconstructions and MIPs were obtained to evaluate the vascular anatomy. Carotid stenosis measurements (when applicable) are obtained utilizing NASCET criteria, using the distal internal carotid diameter as the denominator. CONTRAST:  144m OMNIPAQUE IOHEXOL 350 MG/ML SOLN COMPARISON:  Head CT earlier  same day FINDINGS: CTA NECK FINDINGS Aortic arch: Aortic atherosclerosis.  Branching pattern is normal. Right carotid system: Common carotid artery is tortuous but widely patent to the bifurcation. Carotid bifurcation is widely patent. Minimal calcified plaque at the distal ICA bulb but no stenosis. Cervical ICA widely patent. Left carotid system: Common carotid artery is tortuous but widely patent to the bifurcation. Carotid bifurcation is normal. Minimal calcified plaque at the ICA bulb but no stenosis. Cervical ICA widely patent beyond that. Vertebral arteries: Right vertebral artery origin is widely patent. Left vertebral artery origin is poorly seen because of regional venous reflux. Both vertebral arteries do appear patent through the cervical region to the foramen magnum. Skeleton: Ordinary cervical spondylosis. Other neck: No mass or lymphadenopathy. Upper chest: Upper lungs are clear. Review of the MIP images confirms the above findings CTA HEAD FINDINGS Anterior circulation: Both internal carotid arteries are patent through the skull base and siphon regions. There is ordinary siphon atherosclerotic calcification. On the left, there is no stenosis. On the right, there is 50-70% stenosis in the siphon. Supraclinoid internal carotid arteries are widely patent. The anterior and middle cerebral vessels are patent. No large vessel occlusion, with particular attention to the right MCA. Posterior circulation: Both vertebral arteries are patent through the foramen magnum to the basilar. No basilar stenosis. Posterior circulation branch vessels are normal. Venous sinuses: Patent and normal. Anatomic variants: None significant. Review of the MIP images confirms the above findings IMPRESSION: No intracranial large vessel occlusion identified at this time, with specific attention to the right MCA branches. Minimal atherosclerotic change at both internal carotid artery bulbs. No stenosis. Atherosclerotic disease in both  carotid siphon regions. No stenosis on the left. 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 Electronically Signed   By: MNelson ChimesM.D.   On: 10/06/2020 15:01   CT HEAD WO CONTRAST (5MM)  Result Date: 10/06/2020 CLINICAL DATA:  Neuro deficit, acute, stroke suspected. Steady gait, left-sided weakness and left-sided vision loss, increased blood pressure, headache for 3 days, history of hypertension. EXAM: CT HEAD WITHOUT CONTRAST TECHNIQUE: Contiguous axial images were obtained from the base of the skull through the vertex without intravenous contrast. COMPARISON:  No pertinent prior exams available for comparison. FINDINGS: Brain: Cerebral volume is normal for age. Region of abnormal cortical/subcortical hypodensity measuring 6.3 x 3.3 x 5.8 cm within the right parietooccipital lobes compatible with acute/early subacute infarction (right PCA vascular territory and right MCA/PCA watershed territory). No significant mass effect at this time. No evidence of hemorrhagic conversion. Chronic appearing lacunar infarct within the left lentiform nucleus. No extra-axial fluid collection. No evidence of an intracranial mass. No midline shift. Vascular: No hyperdense vessel.  Atherosclerotic  calcifications. Skull: Normal. Negative for fracture or focal lesion. Sinuses/Orbits: Visualized orbits show no acute finding. Mild mucosal thickening within the frontal sinuses bilaterally. Mild-to-moderate mucosal thickening within the bilateral ethmoid air cells. These results were discussed by telephone at the time of interpretation on 10/06/2020 at 1:55 pm with provider MATTHEW TRIFAN , who verbally acknowledged these results. IMPRESSION: 6.3 x 3.3 x 5.8 cm acute/early subacute cortical and subcortical infarct within the right parietooccipital lobes (right PCA vascular territory and right MCA/PCA watershed territory). Consider MR or CT angiography for further evaluation. Chronic left basal ganglia lacunar infarct. Paranasal  sinus disease at the imaged levels, as described. Electronically Signed   By: Kellie Simmering DO   On: 10/06/2020 14:13   CT Angio Neck W and/or Wo Contrast  Result Date: 10/06/2020 CLINICAL DATA:  Stroke. TIA. Assess intracranial arteries. Unsteady gait. Left-sided weakness. Left-sided visual loss. Headache. EXAM: CT ANGIOGRAPHY HEAD AND NECK TECHNIQUE: Multidetector CT imaging of the head and neck was performed using the standard protocol during bolus administration of intravenous contrast. Multiplanar CT image reconstructions and MIPs were obtained to evaluate the vascular anatomy. Carotid stenosis measurements (when applicable) are obtained utilizing NASCET criteria, using the distal internal carotid diameter as the denominator. CONTRAST:  174m OMNIPAQUE IOHEXOL 350 MG/ML SOLN COMPARISON:  Head CT earlier same day FINDINGS: CTA NECK FINDINGS Aortic arch: Aortic atherosclerosis.  Branching pattern is normal. Right carotid system: Common carotid artery is tortuous but widely patent to the bifurcation. Carotid bifurcation is widely patent. Minimal calcified plaque at the distal ICA bulb but no stenosis. Cervical ICA widely patent. Left carotid system: Common carotid artery is tortuous but widely patent to the bifurcation. Carotid bifurcation is normal. Minimal calcified plaque at the ICA bulb but no stenosis. Cervical ICA widely patent beyond that. Vertebral arteries: Right vertebral artery origin is widely patent. Left vertebral artery origin is poorly seen because of regional venous reflux. Both vertebral arteries do appear patent through the cervical region to the foramen magnum. Skeleton: Ordinary cervical spondylosis. Other neck: No mass or lymphadenopathy. Upper chest: Upper lungs are clear. Review of the MIP images confirms the above findings CTA HEAD FINDINGS Anterior circulation: Both internal carotid arteries are patent through the skull base and siphon regions. There is ordinary siphon atherosclerotic  calcification. On the left, there is no stenosis. On the right, there is 50-70% stenosis in the siphon. Supraclinoid internal carotid arteries are widely patent. The anterior and middle cerebral vessels are patent. No large vessel occlusion, with particular attention to the right MCA. Posterior circulation: Both vertebral arteries are patent through the foramen magnum to the basilar. No basilar stenosis. Posterior circulation branch vessels are normal. Venous sinuses: Patent and normal. Anatomic variants: None significant. Review of the MIP images confirms the above findings IMPRESSION: No intracranial large vessel occlusion identified at this time, with specific attention to the right MCA branches. Minimal atherosclerotic change at both internal carotid artery bulbs. No stenosis. 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 No stenosis on the left. 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 Electronically Signed   By: MNelson ChimesM.D.   On: 10/06/2020 15:01   MR BRAIN WO CONTRAST  Result Date: 10/07/2020 CLINICAL DATA:  Neuro deficit, acute, stroke suspected. Additional history provided: Vision change and mild left-sided weakness which began 2 days ago. EXAM: MRI HEAD WITHOUT CONTRAST TECHNIQUE: Multiplanar, multiecho pulse sequences of the brain and surrounding structures were obtained without intravenous contrast. COMPARISON:  Noncontrast head CT 10/06/2020. CT  angiogram head/neck 10/06/2020. FINDINGS: Brain: Cerebral volume is normal. Region of abnormal cortical/subcortical restricted diffusion within the right parietal and occipital lobes measuring 6.2 x 3.3 x 6.4 cm compatible with acute/early subacute infarction (at the junction of the right MCA and PCA vascular territories). This has not significantly changed in extent as compared to the head CT performed yesterday 10/06/2020. Mild T1 hyperintensity and SWI signal loss within the infarction territory compatible with petechial hemorrhage  and possibly cortical laminar necrosis. No significant mass effect at this time. There are numerous additional small patchy cortical and subcortical acute/early subacute infarcts within the right frontal and parietal lobes (watershed distribution). Background mild multifocal T2/FLAIR hyperintensity within the cerebral white matter, nonspecific but compatible with chronic small vessel ischemic disease. Chronic lacunar infarct with associated chronic hemosiderin deposition within the left basal ganglia/posterior limb of left internal capsule. No evidence of an intracranial mass. No extra-axial fluid collection. No midline shift. Vascular: Maintained proximal arterial flow voids. Skull and upper cervical spine: No focal suspicious marrow lesion. Incompletely assessed cervical spondylosis. Sinuses/Orbits: Visualized orbits show no acute finding. 11 mm left maxillary sinus mucous retention cyst. Moderate bilateral ethmoid sinus mucosal thickening. Trace mucosal thickening within the left frontal sinus. IMPRESSION: 6.2 x 6.4 cm acute/early subacute cortical and subcortical infarction within the right parietal and occipital lobes (at the junction of the right MCA and PCA territories). The infarct has not significantly changed in extent as compared to the head CT of 10/06/2020. Mild petechial hemorrhage within the infarction territory. No significant mass effect at this time. Numerous additional small patchy cortical and subcortical acute/early subacute infarcts within the right frontal and parietal lobes (watershed distribution). 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 Background mild chronic small vessel ischemic changes within the cerebral white matter. Paranasal sinus disease at the imaged levels, as described. Electronically Signed   By: Kellie Simmering DO   On: 10/07/2020 07:57   ECHOCARDIOGRAM COMPLETE  Result Date: 10/07/2020    ECHOCARDIOGRAM REPORT    Patient Name:   SAUNDERS HEINZMAN Date of Exam: 10/07/2020 Medical Rec #:  [id number]    Height:       65.0 in Accession #:    [id number]   Weight: Date of Birth:  05-31-1950     BSA: Patient Age:    16 years     BP:           115/58 mmHg Patient Gender: M            HR:           66 bpm. Exam Location:  Inpatient Procedure: 2D Echo, Cardiac Doppler and Color Doppler Indications:    Stroke I63.9  History:        Patient has no prior history of Echocardiogram examinations.                 Risk Factors:Hypertension and Dyslipidemia. PVD. PE.  Sonographer:    Vickie Epley RDCS Referring Phys: [place]  1. Left ventricular ejection fraction, by estimation, is 65 to 70%. The left ventricle has hyperdynamic function. The left ventricle has no regional wall motion abnormalities. Left ventricular diastolic parameters are consistent with Grade I diastolic dysfunction (impaired relaxation).  2. Right ventricular systolic function is normal. The right ventricular size is normal. Tricuspid regurgitation signal is inadequate for assessing PA pressure.  3. The mitral valve is normal in structure. No evidence of mitral valve regurgitation. No  evidence of mitral stenosis.  4. The aortic valve is tricuspid. Aortic valve regurgitation is not visualized. No aortic stenosis is present.  5. The inferior vena cava is normal in size with greater than 50% respiratory variability, suggesting right atrial pressure of 3 mmHg. FINDINGS  Left Ventricle: Left ventricular ejection fraction, by estimation, is 65 to 70%. The left ventricle has hyperdynamic function. The left ventricle has no regional wall motion abnormalities. The left ventricular internal cavity size was normal in size. There is no left ventricular hypertrophy. Left ventricular diastolic parameters are consistent with Grade I diastolic dysfunction (impaired relaxation). Right Ventricle: The right ventricular size is normal. No increase in right ventricular wall  thickness. Right ventricular systolic function is normal. Tricuspid regurgitation signal is inadequate for assessing PA pressure. Left Atrium: Left atrial size was normal in size. Right Atrium: Right atrial size was normal in size. Pericardium: There is no evidence of pericardial effusion. Mitral Valve: The mitral valve is normal in structure. No evidence of mitral valve regurgitation. No evidence of mitral valve stenosis. Tricuspid Valve: The tricuspid valve is normal in structure. Tricuspid valve regurgitation is not demonstrated. Aortic Valve: The aortic valve is tricuspid. Aortic valve regurgitation is not visualized. No aortic stenosis is present. Pulmonic Valve: The pulmonic valve was normal in structure. Pulmonic valve regurgitation is not visualized. Aorta: The aortic root is normal in size and structure. Venous: The inferior vena cava is normal in size with greater than 50% respiratory variability, suggesting right atrial pressure of 3 mmHg. IAS/Shunts: No atrial level shunt detected by color flow Doppler.  LEFT VENTRICLE PLAX 2D LVIDd:         3.60 cm      Diastology LVIDs:         2.50 cm      LV e' medial:    6.00 cm/s LV PW:         1.00 cm      LV E/e' medial:  7.4 LV IVS:        1.10 cm      LV e' lateral:   7.62 cm/s LVOT diam:     2.20 cm      LV E/e' lateral: 5.8 LV SV:         97 LVOT Area:     3.80 cm  LV Volumes (MOD) LV vol d, MOD A2C: 98.1 ml LV vol d, MOD A4C: 108.0 ml LV vol s, MOD A2C: 39.6 ml LV vol s, MOD A4C: 40.2 ml LV SV MOD A2C:     58.5 ml LV SV MOD A4C:     108.0 ml LV SV MOD BP:      64.0 ml RIGHT VENTRICLE RV S prime:     14.80 cm/s TAPSE (M-mode): 2.3 cm LEFT ATRIUM           RIGHT ATRIUM LA diam:      3.20 cm RA Area:     14.70 cm LA Vol (A2C): 31.0 ml RA Volume:   36.60 ml LA Vol (A4C): 24.7 ml  AORTIC VALVE LVOT Vmax:   129.00 cm/s LVOT Vmean:  96.600 cm/s LVOT VTI:    0.254 m  AORTA Ao Root diam: 3.50 cm Ao Asc diam:  3.50 cm MITRAL VALVE MV Area (PHT): 2.41 cm    SHUNTS  MV Decel Time: 315 msec    Systemic VTI:  0.25 m MV E velocity: 44.50 cm/s  Systemic Diam: 2.20 cm MV A velocity: 65.60 cm/s MV E/A ratio:  0.68 Loralie Champagne MD Electronically signed by Loralie Champagne MD Signature Date/Time: 10/07/2020/5:59:38 PM    Final       Subjective:  Patient denies any new complaints today, still reports vision trouble, but report it is feeling better, he denies any new focal deficits Discharge Exam: Vitals:   10/08/20 1243 10/08/20 1559  BP: 133/67 140/77  Pulse: 62 68  Resp: 20 20  Temp: 98.2 F (36.8 C) 98.3 F (36.8 C)  SpO2: 99% 100%   Vitals:   10/08/20 0750 10/08/20 1243 10/08/20 1350 10/08/20 1559  BP: (!) 120/91 133/67  140/77  Pulse: (!) 58 62  68  Resp: '19 20  20  '$ Temp: 98.2 F (36.8 C) 98.2 F (36.8 C)  98.3 F (36.8 C)  TempSrc: Oral Oral  Oral  SpO2: 96% 99%  100%  Weight:   96.1 kg   Height:        General: Pt is alert, awake, not in acute distress, he is with left dense homonymous hemianopsia no facial droop, menaced fine finger motor movements on the left hand Cardiovascular: RRR, S1/S2 +, no rubs, no gallops Respiratory: CTA bilaterally, no wheezing, no rhonchi Abdominal: Soft, NT, ND, bowel sounds + Extremities: no edema, no cyanosis    The results of significant diagnostics from this hospitalization (including imaging, microbiology, ancillary and laboratory) are listed below for reference.     Microbiology: Recent Results (from the past 240 hour(s))  Resp Panel by RT-PCR (Flu A&B, Covid) Nasopharyngeal Swab     Status: None   Collection Time: 10/06/20  2:18 PM   Specimen: Nasopharyngeal Swab; Nasopharyngeal(NP) swabs in vial transport medium  Result Value Ref Range Status   SARS Coronavirus 2 by RT PCR NEGATIVE NEGATIVE Final    Comment: (NOTE) SARS-CoV-2 target nucleic acids are NOT DETECTED.  The SARS-CoV-2 RNA is generally detectable in upper respiratory specimens during the acute phase of infection. The  lowest concentration of SARS-CoV-2 viral copies this assay can detect is 138 copies/mL. A negative result does not preclude SARS-Cov-2 infection and should not be used as the sole basis for treatment or other patient management decisions. A negative result may occur with  improper specimen collection/handling, submission of specimen other than nasopharyngeal swab, presence of viral mutation(s) within the areas targeted by this assay, and inadequate number of viral copies(<138 copies/mL). A negative result must be combined with clinical observations, patient history, and epidemiological information. The expected result is Negative.  Fact Sheet for Patients:  EntrepreneurPulse.com.au  Fact Sheet for Healthcare Providers:  IncredibleEmployment.be  This test is no t yet approved or cleared by the [place] FDA and  has been authorized for detection and/or diagnosis of SARS-CoV-2 by FDA under an Emergency Use Authorization (EUA). This EUA will remain  in effect (meaning this test can be used) for the duration of the COVID-19 declaration under Section 564(b)(1) of the Act, 21 U.S.C.section 360bbb-3(b)(1), unless the authorization is terminated  or revoked sooner.       Influenza A by PCR NEGATIVE NEGATIVE Final   Influenza B by PCR NEGATIVE NEGATIVE Final    Comment: (NOTE) The Xpert Xpress SARS-CoV-2/FLU/RSV plus assay is intended as an aid in the diagnosis of influenza from Nasopharyngeal swab specimens and should not be used as a sole basis for treatment. Nasal washings and aspirates are unacceptable for Xpert Xpress SARS-CoV-2/FLU/RSV testing.  Fact Sheet for Patients: EntrepreneurPulse.com.au  Fact Sheet for Healthcare Providers: IncredibleEmployment.be  This test is not yet approved or cleared  by the [place] and has been authorized for detection and/or diagnosis of SARS-CoV-2 by FDA under  an Emergency Use Authorization (EUA). This EUA will remain in effect (meaning this test can be used) for the duration of the COVID-19 declaration under Section 564(b)(1) of the Act, 21 U.S.C. section 360bbb-3(b)(1), unless the authorization is terminated or revoked.  Performed at [facility], [address] [place], [place] [address]      Labs: BNP (last 3 results) No results for input(s): BNP in the last 8760 hours. Basic Metabolic Panel: Recent Labs  Lab 10/06/20 1353 10/07/20 0826 10/08/20 0317  NA 136 137 137  K 3.3* 3.4* 3.9  CL 103 104 104  CO2 '25 25 27  '$ GLUCOSE 143* 109* 96  BUN '20 19 22  '$ CREATININE 1.15 1.38* 1.37*  CALCIUM 8.7* 8.7* 8.5*   Liver Function Tests: Recent Labs  Lab 10/06/20 1434  AST 26  ALT 15  ALKPHOS 81  BILITOT 0.8  PROT 7.8  ALBUMIN 4.3   No results for input(s): LIPASE, AMYLASE in the last 168 hours. No results for input(s): AMMONIA in the last 168 hours. CBC: Recent Labs  Lab 10/06/20 1353 10/07/20 0826 10/08/20 0317  WBC 6.3 5.8 5.6  HGB 14.3 12.9* 12.1*  HCT 43.7 41.0 38.7*  MCV 80.5 81.0 82.7  PLT 194 196 174   Cardiac Enzymes: No results for input(s): CKTOTAL, CKMB, CKMBINDEX, TROPONINI in the last 168 hours. BNP: Invalid input(s): POCBNP CBG: Recent Labs  Lab 10/06/20 1351  GLUCAP 136*   D-Dimer No results for input(s): DDIMER in the last 72 hours. Hgb A1c Recent Labs    10/07/20 0450  HGBA1C 5.9*   Lipid Profile Recent Labs    10/07/20 0450  CHOL 128  HDL 40*  LDLCALC 75  TRIG 63  CHOLHDL 3.2   Thyroid function studies No results for input(s): TSH, T4TOTAL, T3FREE, THYROIDAB in the last 72 hours.  Invalid input(s): FREET3 Anemia work up No results for input(s): VITAMINB12, FOLATE, FERRITIN, TIBC, IRON, RETICCTPCT in the last 72 hours. Urinalysis    Component Value Date/Time   COLORURINE YELLOW 10/06/2020 1353   APPEARANCEUR CLEAR 10/06/2020 1353   LABSPEC 1.020 10/06/2020 1353    PHURINE 6.0 10/06/2020 1353   GLUCOSEU NEGATIVE 10/06/2020 1353   HGBUR TRACE (A) 10/06/2020 1353   BILIRUBINUR NEGATIVE 10/06/2020 1353   KETONESUR NEGATIVE 10/06/2020 1353   PROTEINUR NEGATIVE 10/06/2020 1353   NITRITE NEGATIVE 10/06/2020 1353   LEUKOCYTESUR SMALL (A) 10/06/2020 1353   Sepsis Labs Invalid input(s): PROCALCITONIN,  WBC,  LACTICIDVEN Microbiology Recent Results (from the past 240 hour(s))  Resp Panel by RT-PCR (Flu A&B, Covid) Nasopharyngeal Swab     Status: None   Collection Time: 10/06/20  2:18 PM   Specimen: Nasopharyngeal Swab; Nasopharyngeal(NP) swabs in vial transport medium  Result Value Ref Range Status   SARS Coronavirus 2 by RT PCR NEGATIVE NEGATIVE Final    Comment: (NOTE) SARS-CoV-2 target nucleic acids are NOT DETECTED.  The SARS-CoV-2 RNA is generally detectable in upper respiratory specimens during the acute phase of infection. The lowest concentration of SARS-CoV-2 viral copies this assay can detect is 138 copies/mL. A negative result does not preclude SARS-Cov-2 infection and should not be used as the sole basis for treatment or other patient management decisions. A negative result may occur with  improper specimen collection/handling, submission of specimen other than nasopharyngeal swab, presence of viral mutation(s) within the areas targeted by this assay,  and inadequate number of viral copies(<138 copies/mL). A negative result must be combined with clinical observations, patient history, and epidemiological information. The expected result is Negative.  Fact Sheet for Patients:  EntrepreneurPulse.com.au  Fact Sheet for Healthcare Providers:  IncredibleEmployment.be  This test is no t yet approved or cleared by the [place] FDA and  has been authorized for detection and/or diagnosis of SARS-CoV-2 by FDA under an Emergency Use Authorization (EUA). This EUA will remain  in effect (meaning this  test can be used) for the duration of the COVID-19 declaration under Section 564(b)(1) of the Act, 21 U.S.C.section 360bbb-3(b)(1), unless the authorization is terminated  or revoked sooner.       Influenza A by PCR NEGATIVE NEGATIVE Final   Influenza B by PCR NEGATIVE NEGATIVE Final    Comment: (NOTE) The Xpert Xpress SARS-CoV-2/FLU/RSV plus assay is intended as an aid in the diagnosis of influenza from Nasopharyngeal swab specimens and should not be used as a sole basis for treatment. Nasal washings and aspirates are unacceptable for Xpert Xpress SARS-CoV-2/FLU/RSV testing.  Fact Sheet for Patients: EntrepreneurPulse.com.au  Fact Sheet for Healthcare Providers: IncredibleEmployment.be  This test is not yet approved or cleared by the [place] FDA and has been authorized for detection and/or diagnosis of SARS-CoV-2 by FDA under an Emergency Use Authorization (EUA). This EUA will remain in effect (meaning this test can be used) for the duration of the COVID-19 declaration under Section 564(b)(1) of the Act, 21 U.S.C. section 360bbb-3(b)(1), unless the authorization is terminated or revoked.  Performed at [facility], [address] [place], [place] [address]      Time coordinating discharge: Over 30 minutes  SIGNED:   Phillips Climes, MD  [facility] 10/08/2020, 5:26 PM Pager   If 7PM-7AM, please contact night-coverage [email or web address] Password [email or web address]

## 2020-10-08 NOTE — Plan of Care (Signed)
  Problem: Education: Goal: Knowledge of General Education information will improve Description: Including pain rating scale, medication(s)/side effects and non-pharmacologic comfort measures Outcome: Adequate for Discharge   Problem: Health Behavior/Discharge Planning: Goal: Ability to manage health-related needs will improve Outcome: Adequate for Discharge   Problem: Clinical Measurements: Goal: Ability to maintain clinical measurements within normal limits will improve Outcome: Adequate for Discharge Goal: Will remain free from infection Outcome: Adequate for Discharge Goal: Diagnostic test results will improve Outcome: Adequate for Discharge Goal: Respiratory complications will improve Outcome: Adequate for Discharge Goal: Cardiovascular complication will be avoided Outcome: Adequate for Discharge   Problem: Activity: Goal: Risk for activity intolerance will decrease Outcome: Adequate for Discharge   Problem: Nutrition: Goal: Adequate nutrition will be maintained Outcome: Adequate for Discharge   Problem: Coping: Goal: Level of anxiety will decrease Outcome: Adequate for Discharge   Problem: Elimination: Goal: Will not experience complications related to bowel motility Outcome: Adequate for Discharge Goal: Will not experience complications related to urinary retention Outcome: Adequate for Discharge   Problem: Pain Managment: Goal: General experience of comfort will improve Outcome: Adequate for Discharge   Problem: Safety: Goal: Ability to remain free from injury will improve Outcome: Adequate for Discharge   Problem: Skin Integrity: Goal: Risk for impaired skin integrity will decrease Outcome: Adequate for Discharge   Problem: Education: Goal: Knowledge of disease or condition will improve Outcome: Adequate for Discharge Goal: Knowledge of secondary prevention will improve Outcome: Adequate for Discharge Goal: Knowledge of patient specific risk factors  addressed and post discharge goals established will improve Outcome: Adequate for Discharge

## 2020-10-08 NOTE — Progress Notes (Signed)
Occupational Therapy Treatment Patient Details Name: Clifford [name] MRN: [id number] DOB: 10-20-1950 Today's Date: 10/08/2020    History of present illness 70 y/o male presented to [facility] ED on 8/3 for headache x 2-3 days, L sided weakness, balance difficulty, and visual loss. CT head shows subacute R MCA territory. 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 PMH: HTN, HLD, hx of PE on coumadin.   OT comments  Patient continues to show progression toward patient focused OT goals.  He is still requiring cues to scan his environment for ADL items and prior to mobility, but with a quick visual field test, he appears to have gained a little more to the L.  He is hoping to discharge to his brother's home later today.  Visual testing at the [facility] is recommended.    Follow Up Recommendations  Other (comment)    Equipment Recommendations  None recommended by OT    Recommendations for Other Services      Precautions / Restrictions Precautions Precautions: Fall Precaution Comments: L visual field deficit Restrictions Weight Bearing Restrictions: No       Mobility Bed Mobility Overal bed mobility: Modified Independent                  Transfers Overall transfer level: Modified independent                    Balance Overall balance assessment: Mild deficits observed, not formally tested                                         ADL either performed or assessed with clinical judgement   ADL   Eating/Feeding: Independent;Sitting   Grooming: Wash/dry hands;Wash/dry face;Applying deodorant;Modified independent;Standing               Lower Body Dressing: Modified independent;Sit to/from stand   Toilet Transfer: Modified Independent;Ambulation           Functional mobility  during ADLs: Modified independent General ADL Comments: cues to scan L visual field     Vision   Visual Fields: Left homonymous hemianopsia Additional Comments: Patient has gained a little more visual field on the L   Perception     Praxis      Cognition Arousal/Alertness: Awake/alert Behavior During Therapy: WFL for tasks assessed/performed Overall Cognitive Status: Within Functional Limits for tasks assessed                                 General Comments: needing cues to scan left to aviod hazards        Exercises     Shoulder Instructions       General Comments      Pertinent Vitals/ Pain       Pain Assessment: No/denies pain                                                          Frequency  Min 2X/week        Progress Toward Goals  OT Goals(current goals can now be found in the care plan section)  Progress towards OT goals: Progressing toward goals  Acute Rehab OT Goals Patient Stated Goal: to get my vision normal OT Goal Formulation: With patient Time For Goal Achievement: 10/21/20 Potential to Achieve Goals: Good  Plan      Co-evaluation                 AM-PAC OT "6 Clicks" Daily Activity     Outcome Measure   Help from another person eating meals?: None Help from another person taking care of personal grooming?: None Help from another person toileting, which includes using toliet, bedpan, or urinal?: None Help from another person bathing (including washing, rinsing, drying)?: None Help from another person to put on and taking off regular upper body clothing?: None Help from another person to put on and taking off regular lower body clothing?: None 6 Click Score: 24    End of Session    OT Visit Diagnosis: Unsteadiness on feet (R26.81)   Activity Tolerance Patient tolerated treatment well   Patient Left in bed;with call [place]/phone within reach;with nursing/sitter in room   Nurse  Communication Mobility status        Time: [id number] OT Time Calculation (min): 15 min  Charges: OT General Charges $OT Visit: 1 Visit OT Treatments $Self Care/Home Management : 8-22 mins  10/08/2020  Clifford [name], Clifford [name]  Acute Rehabilitation Services  Office:  [phone]    Clifford [name] 10/08/2020, 9:06 AM

## 2020-10-08 NOTE — TOC Initial Note (Signed)
Transition of Care [facility]) - Initial/Assessment Note    Patient Details  Name: Clifford [name] MRN: [id number] Date of Birth: [birth year]  [facility] of [facility]) CM/SW Contact:    Pollie Friar, RN Phone Number: 10/08/2020, 2:41 PM  Clinical Narrative:                 Patient is from [place] in [place] visiting his brother. He is [facility] and [facility] has seen his since in [place]. His SW is [place] through the [place]: [phone] ext [phone]. Orders for outpatient therapy sent to [facility]. Pradaxa filled through [facility] with free coupon. Medication should be in pts room. TOC following for further d/c needs.   Expected Discharge Plan: OP Rehab Barriers to Discharge: Continued Medical Work up   Patient Goals and CMS Choice     Choice offered to / list presented to : Patient  Expected Discharge Plan and Services Expected Discharge Plan: OP Rehab   Discharge Planning Services: CM Consult   Living arrangements for the past 2 months: Single Family Home                                      Prior Living Arrangements/Services Living arrangements for the past 2 months: Single Family Home Lives with:: Siblings Patient language and need for interpreter reviewed:: Yes Do you feel safe going back to the place where you live?: Yes      Need for Family Participation in Patient Care: Yes (Comment) Care giver support system in place?: Yes (comment)   Criminal Activity/Legal Involvement Pertinent to Current Situation/Hospitalization: No - Comment as needed  Activities of Daily Living      Permission Sought/Granted                  Emotional Assessment Appearance:: Appears stated age Attitude/Demeanor/Rapport: Engaged Affect (typically observed): Accepting Orientation: : Oriented to Self, Oriented to Place, Oriented to  Time, Oriented to Situation   Psych Involvement: No (comment)  Admission diagnosis:  Stroke [facility]) [I63.9] Cerebrovascular accident (CVA), unspecified  mechanism ([facility]) [I63.9] Patient Active Problem List   Diagnosis Date Noted   Stroke ([place]) 10/06/2020   HTN (hypertension) 10/06/2020   HLD (hyperlipidemia) 10/06/2020   Smoking 10/06/2020   PCP:  System, Provider Not In Pharmacy:   [facility] [address] [place] [address] Phone: [phone] Fax: [phone]     Social Determinants of Health (SDOH) Interventions    Readmission Risk Interventions No flowsheet data found.

## 2020-10-08 NOTE — Plan of Care (Signed)
Patient is alert oriented x 4.  Ambulatory to bathroom with  stand by assist. Pt denies pain. Denies numbness or tingling to arms or legs.  Pts vision to left eye is impaired. Pt has IV fluids at 57m/hr.     Problem: Education: Goal: Knowledge of General Education information will improve Description: Including pain rating scale, medication(s)/side effects and non-pharmacologic comfort measures Outcome: Progressing   Problem: Health Behavior/Discharge Planning: Goal: Ability to manage health-related needs will improve Outcome: Progressing   Problem: Clinical Measurements: Goal: Ability to maintain clinical measurements within normal limits will improve Outcome: Progressing Goal: Will remain free from infection Outcome: Progressing Goal: Diagnostic test results will improve Outcome: Progressing Goal: Respiratory complications will improve Outcome: Progressing Goal: Cardiovascular complication will be avoided Outcome: Progressing   Problem: Activity: Goal: Risk for activity intolerance will decrease Outcome: Progressing   Problem: Nutrition: Goal: Adequate nutrition will be maintained Outcome: Progressing   Problem: Nutrition: Goal: Adequate nutrition will be maintained Outcome: Progressing   Problem: Coping: Goal: Level of anxiety will decrease Outcome: Progressing   Problem: Elimination: Goal: Will not experience complications related to bowel motility Outcome: Progressing Goal: Will not experience complications related to urinary retention Outcome: Progressing   Problem: Pain Managment: Goal: General experience of comfort will improve Outcome: Progressing   Problem: Safety: Goal: Ability to remain free from injury will improve Outcome: Progressing   Problem: Skin Integrity: Goal: Risk for impaired skin integrity will decrease Outcome: Progressing   Problem: Education: Goal: Knowledge of disease or condition will improve Outcome: Progressing Goal:  Knowledge of secondary prevention will improve Outcome: Progressing Goal: Knowledge of patient specific risk factors addressed and post discharge goals established will improve Outcome: Progressing

## 2020-10-12 ENCOUNTER — Other Ambulatory Visit: Payer: Self-pay | Admitting: *Deleted

## 2020-10-12 NOTE — Patient Outreach (Addendum)
[facility]) Care Management  10/12/2020  AMARA BILLEY 1950-03-20 [id number]   RED ON EMMI ALERT - Stroke Day # 1 Date: 8/8 Red Alert Reason: Not scheduled follow up appointment, Not filled prescriptions, and Having problems setting up rehab   Outreach attempt #1,  unsuccessful, HIPAA complaint voice message left.  Member's listed home number is the same as brother's listed number.   Plan: RN CM will send outreach letter however member's listed home address is in [place].  Member currently in [place] with brother, no temporary address in chart.  Will follow up within the next 3-4 business days.  Valente David, [place], MSN [place] [phone]

## 2020-10-14 ENCOUNTER — Inpatient Hospital Stay (HOSPITAL_COMMUNITY)
Admission: EM | Admit: 2020-10-14 | Discharge: 2020-12-14 | DRG: 064 | Disposition: A | Discharge status: Skilled Nursing Facility | Payer: No Typology Code available for payment source | Attending: Internal Medicine | Admitting: Internal Medicine

## 2020-10-14 ENCOUNTER — Encounter (HOSPITAL_COMMUNITY): Payer: Self-pay

## 2020-10-14 ENCOUNTER — Emergency Department (HOSPITAL_COMMUNITY): Payer: No Typology Code available for payment source

## 2020-10-14 ENCOUNTER — Other Ambulatory Visit: Payer: Self-pay

## 2020-10-14 DIAGNOSIS — D696 Thrombocytopenia, unspecified: Secondary | ICD-10-CM | POA: Diagnosis not present

## 2020-10-14 DIAGNOSIS — I130 Hypertensive heart and chronic kidney disease with heart failure and stage 1 through stage 4 chronic kidney disease, or unspecified chronic kidney disease: Secondary | ICD-10-CM | POA: Diagnosis present

## 2020-10-14 DIAGNOSIS — Z905 Acquired absence of kidney: Secondary | ICD-10-CM | POA: Diagnosis not present

## 2020-10-14 DIAGNOSIS — E876 Hypokalemia: Secondary | ICD-10-CM | POA: Diagnosis not present

## 2020-10-14 DIAGNOSIS — E782 Mixed hyperlipidemia: Secondary | ICD-10-CM | POA: Diagnosis present

## 2020-10-14 DIAGNOSIS — R29810 Facial weakness: Secondary | ICD-10-CM | POA: Diagnosis present

## 2020-10-14 DIAGNOSIS — Z515 Encounter for palliative care: Secondary | ICD-10-CM | POA: Diagnosis not present

## 2020-10-14 DIAGNOSIS — N179 Acute kidney failure, unspecified: Secondary | ICD-10-CM | POA: Diagnosis not present

## 2020-10-14 DIAGNOSIS — I69354 Hemiplegia and hemiparesis following cerebral infarction affecting left non-dominant side: Secondary | ICD-10-CM

## 2020-10-14 DIAGNOSIS — R509 Fever, unspecified: Secondary | ICD-10-CM | POA: Diagnosis not present

## 2020-10-14 DIAGNOSIS — R569 Unspecified convulsions: Secondary | ICD-10-CM

## 2020-10-14 DIAGNOSIS — R414 Neurologic neglect syndrome: Secondary | ICD-10-CM | POA: Diagnosis not present

## 2020-10-14 DIAGNOSIS — G8114 Spastic hemiplegia affecting left nondominant side: Secondary | ICD-10-CM | POA: Diagnosis present

## 2020-10-14 DIAGNOSIS — G40909 Epilepsy, unspecified, not intractable, without status epilepticus: Secondary | ICD-10-CM | POA: Diagnosis present

## 2020-10-14 DIAGNOSIS — G40109 Localization-related (focal) (partial) symptomatic epilepsy and epileptic syndromes with simple partial seizures, not intractable, without status epilepticus: Secondary | ICD-10-CM | POA: Diagnosis not present

## 2020-10-14 DIAGNOSIS — H5711 Ocular pain, right eye: Secondary | ICD-10-CM | POA: Diagnosis not present

## 2020-10-14 DIAGNOSIS — Z20822 Contact with and (suspected) exposure to covid-19: Secondary | ICD-10-CM | POA: Diagnosis present

## 2020-10-14 DIAGNOSIS — R7303 Prediabetes: Secondary | ICD-10-CM | POA: Diagnosis not present

## 2020-10-14 DIAGNOSIS — Z79899 Other long term (current) drug therapy: Secondary | ICD-10-CM

## 2020-10-14 DIAGNOSIS — R441 Visual hallucinations: Secondary | ICD-10-CM | POA: Diagnosis present

## 2020-10-14 DIAGNOSIS — Z7901 Long term (current) use of anticoagulants: Secondary | ICD-10-CM | POA: Diagnosis not present

## 2020-10-14 DIAGNOSIS — N1831 Chronic kidney disease, stage 3a: Secondary | ICD-10-CM | POA: Diagnosis present

## 2020-10-14 DIAGNOSIS — N400 Enlarged prostate without lower urinary tract symptoms: Secondary | ICD-10-CM | POA: Diagnosis present

## 2020-10-14 DIAGNOSIS — Z66 Do not resuscitate: Secondary | ICD-10-CM | POA: Diagnosis not present

## 2020-10-14 DIAGNOSIS — E870 Hyperosmolality and hypernatremia: Secondary | ICD-10-CM | POA: Diagnosis not present

## 2020-10-14 DIAGNOSIS — I5032 Chronic diastolic (congestive) heart failure: Secondary | ICD-10-CM | POA: Diagnosis present

## 2020-10-14 DIAGNOSIS — R1311 Dysphagia, oral phase: Secondary | ICD-10-CM | POA: Diagnosis present

## 2020-10-14 DIAGNOSIS — Z85528 Personal history of other malignant neoplasm of kidney: Secondary | ICD-10-CM

## 2020-10-14 DIAGNOSIS — F17210 Nicotine dependence, cigarettes, uncomplicated: Secondary | ICD-10-CM | POA: Diagnosis not present

## 2020-10-14 DIAGNOSIS — I619 Nontraumatic intracerebral hemorrhage, unspecified: Secondary | ICD-10-CM | POA: Diagnosis present

## 2020-10-14 DIAGNOSIS — G936 Cerebral edema: Secondary | ICD-10-CM | POA: Diagnosis present

## 2020-10-14 DIAGNOSIS — I10 Essential (primary) hypertension: Secondary | ICD-10-CM | POA: Diagnosis not present

## 2020-10-14 DIAGNOSIS — I639 Cerebral infarction, unspecified: Secondary | ICD-10-CM | POA: Diagnosis present

## 2020-10-14 DIAGNOSIS — M1712 Unilateral primary osteoarthritis, left knee: Secondary | ICD-10-CM | POA: Diagnosis present

## 2020-10-14 DIAGNOSIS — R29705 NIHSS score 5: Secondary | ICD-10-CM | POA: Diagnosis present

## 2020-10-14 DIAGNOSIS — R1310 Dysphagia, unspecified: Secondary | ICD-10-CM

## 2020-10-14 DIAGNOSIS — R0602 Shortness of breath: Secondary | ICD-10-CM

## 2020-10-14 DIAGNOSIS — Z7189 Other specified counseling: Secondary | ICD-10-CM

## 2020-10-14 DIAGNOSIS — R531 Weakness: Secondary | ICD-10-CM | POA: Insufficient documentation

## 2020-10-14 DIAGNOSIS — Z86711 Personal history of pulmonary embolism: Secondary | ICD-10-CM | POA: Diagnosis not present

## 2020-10-14 DIAGNOSIS — R059 Cough, unspecified: Secondary | ICD-10-CM

## 2020-10-14 DIAGNOSIS — Z888 Allergy status to other drugs, medicaments and biological substances status: Secondary | ICD-10-CM

## 2020-10-14 DIAGNOSIS — M25532 Pain in left wrist: Secondary | ICD-10-CM

## 2020-10-14 DIAGNOSIS — M24542 Contracture, left hand: Secondary | ICD-10-CM | POA: Diagnosis present

## 2020-10-14 DIAGNOSIS — I63511 Cerebral infarction due to unspecified occlusion or stenosis of right middle cerebral artery: Secondary | ICD-10-CM | POA: Diagnosis present

## 2020-10-14 DIAGNOSIS — R471 Dysarthria and anarthria: Secondary | ICD-10-CM | POA: Diagnosis present

## 2020-10-14 DIAGNOSIS — M21372 Foot drop, left foot: Secondary | ICD-10-CM | POA: Diagnosis present

## 2020-10-14 DIAGNOSIS — M25569 Pain in unspecified knee: Secondary | ICD-10-CM

## 2020-10-14 DIAGNOSIS — R4702 Dysphasia: Secondary | ICD-10-CM | POA: Diagnosis not present

## 2020-10-14 DIAGNOSIS — Z86718 Personal history of other venous thrombosis and embolism: Secondary | ICD-10-CM

## 2020-10-14 DIAGNOSIS — R000 Tachycardia, unspecified: Secondary | ICD-10-CM | POA: Diagnosis not present

## 2020-10-14 DIAGNOSIS — R233 Spontaneous ecchymoses: Secondary | ICD-10-CM | POA: Diagnosis present

## 2020-10-14 DIAGNOSIS — H53462 Homonymous bilateral field defects, left side: Secondary | ICD-10-CM | POA: Diagnosis present

## 2020-10-14 DIAGNOSIS — R400 Somnolence: Secondary | ICD-10-CM | POA: Diagnosis present

## 2020-10-14 DIAGNOSIS — I63531 Cerebral infarction due to unspecified occlusion or stenosis of right posterior cerebral artery: Secondary | ICD-10-CM | POA: Diagnosis present

## 2020-10-14 DIAGNOSIS — Z4659 Encounter for fitting and adjustment of other gastrointestinal appliance and device: Secondary | ICD-10-CM

## 2020-10-14 DIAGNOSIS — E875 Hyperkalemia: Secondary | ICD-10-CM | POA: Diagnosis not present

## 2020-10-14 DIAGNOSIS — I6389 Other cerebral infarction: Secondary | ICD-10-CM | POA: Diagnosis not present

## 2020-10-14 DIAGNOSIS — R197 Diarrhea, unspecified: Secondary | ICD-10-CM | POA: Diagnosis not present

## 2020-10-14 DIAGNOSIS — Z23 Encounter for immunization: Secondary | ICD-10-CM

## 2020-10-14 DIAGNOSIS — R911 Solitary pulmonary nodule: Secondary | ICD-10-CM

## 2020-10-14 DIAGNOSIS — R5381 Other malaise: Secondary | ICD-10-CM | POA: Diagnosis present

## 2020-10-14 DIAGNOSIS — Z6835 Body mass index (BMI) 35.0-35.9, adult: Secondary | ICD-10-CM

## 2020-10-14 DIAGNOSIS — R001 Bradycardia, unspecified: Secondary | ICD-10-CM | POA: Diagnosis present

## 2020-10-14 DIAGNOSIS — R482 Apraxia: Secondary | ICD-10-CM | POA: Diagnosis present

## 2020-10-14 HISTORY — DX: Long term (current) use of anticoagulants: Z79.01

## 2020-10-14 HISTORY — DX: Personal history of pulmonary embolism: Z86.711

## 2020-10-14 HISTORY — DX: Nicotine dependence, cigarettes, uncomplicated: F17.210

## 2020-10-14 HISTORY — DX: Prediabetes: R73.03

## 2020-10-14 LAB — CBC
HCT: 42.1 % (ref 39.0–52.0)
Hemoglobin: 13.5 g/dL (ref 13.0–17.0)
MCH: 26.2 pg (ref 26.0–34.0)
MCHC: 32.1 g/dL (ref 30.0–36.0)
MCV: 81.6 fL (ref 80.0–100.0)
Platelets: 227 10*3/uL (ref 150–400)
RBC: 5.16 MIL/uL (ref 4.22–5.81)
RDW: 15 % (ref 11.5–15.5)
WBC: 4.3 10*3/uL (ref 4.0–10.5)
nRBC: 0 % (ref 0.0–0.2)

## 2020-10-14 LAB — COMPREHENSIVE METABOLIC PANEL
ALT: 13 U/L (ref 0–44)
AST: 22 U/L (ref 15–41)
Albumin: 3.6 g/dL (ref 3.5–5.0)
Alkaline Phosphatase: 77 U/L (ref 38–126)
Anion gap: 11 (ref 5–15)
BUN: 17 mg/dL (ref 8–23)
CO2: 22 mmol/L (ref 22–32)
Calcium: 9.2 mg/dL (ref 8.9–10.3)
Chloride: 104 mmol/L (ref 98–111)
Creatinine, Ser: 1.3 mg/dL — ABNORMAL HIGH (ref 0.61–1.24)
GFR, Estimated: 59 mL/min — ABNORMAL LOW (ref >60)
Glucose, Bld: 117 mg/dL — ABNORMAL HIGH (ref 70–99)
Potassium: 3.7 mmol/L (ref 3.5–5.1)
Sodium: 137 mmol/L (ref 135–145)
Total Bilirubin: 1 mg/dL (ref 0.3–1.2)
Total Protein: 7.1 g/dL (ref 6.5–8.1)

## 2020-10-14 LAB — I-STAT CHEM 8, ED
BUN: 19 mg/dL (ref 8–23)
Calcium, Ion: 1.13 mmol/L — ABNORMAL LOW (ref 1.15–1.40)
Chloride: 104 mmol/L (ref 98–111)
Creatinine, Ser: 1.2 mg/dL (ref 0.61–1.24)
Glucose, Bld: 118 mg/dL — ABNORMAL HIGH (ref 70–99)
HCT: 42 % (ref 39.0–52.0)
Hemoglobin: 14.3 g/dL (ref 13.0–17.0)
Potassium: 3.6 mmol/L (ref 3.5–5.1)
Sodium: 140 mmol/L (ref 135–145)
TCO2: 24 mmol/L (ref 22–32)

## 2020-10-14 LAB — PROTIME-INR
INR: 1.4 — ABNORMAL HIGH (ref 0.8–1.2)
Prothrombin Time: 17.5 seconds — ABNORMAL HIGH (ref 11.4–15.2)

## 2020-10-14 LAB — RESP PANEL BY RT-PCR (FLU A&B, COVID) ARPGX2
Influenza A by PCR: NEGATIVE
Influenza B by PCR: NEGATIVE
SARS Coronavirus 2 by RT PCR: NEGATIVE

## 2020-10-14 LAB — DIFFERENTIAL
Abs Immature Granulocytes: 0.02 10*3/uL (ref 0.00–0.07)
Basophils Absolute: 0 10*3/uL (ref 0.0–0.1)
Basophils Relative: 1 %
Eosinophils Absolute: 0.1 10*3/uL (ref 0.0–0.5)
Eosinophils Relative: 2 %
Immature Granulocytes: 1 %
Lymphocytes Relative: 24 %
Lymphs Abs: 1 10*3/uL (ref 0.7–4.0)
Monocytes Absolute: 0.6 10*3/uL (ref 0.1–1.0)
Monocytes Relative: 13 %
Neutro Abs: 2.6 10*3/uL (ref 1.7–7.7)
Neutrophils Relative %: 59 %

## 2020-10-14 LAB — APTT: aPTT: 82 seconds — ABNORMAL HIGH (ref 24–36)

## 2020-10-14 MED ORDER — POLYETHYLENE GLYCOL 3350 17 G PO PACK: 17.0000 g | PACK | Freq: Every day | ORAL | Status: DC | PRN

## 2020-10-14 MED ORDER — ATORVASTATIN CALCIUM 40 MG PO TABS
40.0000 mg | ORAL_TABLET | Freq: Every day | ORAL | Status: DC
  Administered 2020-10-15 – 2020-10-22 (×7): 40 mg via ORAL
  Filled 2020-10-14 (×9): qty 1

## 2020-10-14 MED ORDER — ACETAMINOPHEN 500 MG PO TABS
1000.0000 mg | ORAL_TABLET | Freq: Once | ORAL | Status: AC
  Administered 2020-10-14: 1000 mg via ORAL
  Filled 2020-10-14: qty 2

## 2020-10-14 MED ORDER — LEVETIRACETAM 500 MG PO TABS
500.0000 mg | ORAL_TABLET | Freq: Two times a day (BID) | ORAL | Status: DC
  Administered 2020-10-15: 500 mg via ORAL
  Filled 2020-10-14: qty 1

## 2020-10-14 MED ORDER — PSYLLIUM 95 % PO PACK
1.0000 | PACK | Freq: Every day | ORAL | Status: DC
  Administered 2020-10-16 – 2020-10-20 (×5): 1 via ORAL
  Filled 2020-10-14 (×11): qty 1

## 2020-10-14 MED ORDER — ONDANSETRON HCL 4 MG/2ML IJ SOLN
4.0000 mg | Freq: Four times a day (QID) | INTRAMUSCULAR | Status: DC | PRN
  Administered 2020-12-07 – 2020-12-13 (×2): 4 mg via INTRAVENOUS
  Filled 2020-10-14 (×2): qty 2

## 2020-10-14 MED ORDER — STROKE: EARLY STAGES OF RECOVERY BOOK
Freq: Once | Status: AC
  Administered 2020-10-14: 1

## 2020-10-14 MED ORDER — ACETAMINOPHEN 325 MG PO TABS
650.0000 mg | ORAL_TABLET | ORAL | Status: DC | PRN
  Administered 2020-10-15 – 2020-11-19 (×6): 650 mg via ORAL
  Filled 2020-10-14 (×6): qty 2

## 2020-10-14 MED ORDER — PSYLLIUM 58.6 % PO PACK: 1.0000 | PACK | Freq: Every day | ORAL | Status: DC

## 2020-10-14 MED ORDER — LEVETIRACETAM IN NACL 1000 MG/100ML IV SOLN
1000.0000 mg | Freq: Once | INTRAVENOUS | Status: AC
  Administered 2020-10-14: 1000 mg via INTRAVENOUS
  Filled 2020-10-14: qty 100

## 2020-10-14 MED ORDER — ACETAMINOPHEN 160 MG/5ML PO SOLN
650.0000 mg | ORAL | Status: DC | PRN
  Administered 2020-11-05 – 2020-11-13 (×3): 650 mg
  Filled 2020-10-14 (×3): qty 20.3

## 2020-10-14 MED ORDER — ACETAMINOPHEN 650 MG RE SUPP: 650.0000 mg | RECTAL | Status: DC | PRN

## 2020-10-14 MED ORDER — FINASTERIDE 5 MG PO TABS
5.0000 mg | ORAL_TABLET | Freq: Every day | ORAL | Status: DC
  Administered 2020-10-15 – 2020-10-21 (×7): 5 mg via ORAL
  Filled 2020-10-14 (×8): qty 1

## 2020-10-14 MED ORDER — LABETALOL HCL 5 MG/ML IV SOLN
10.0000 mg | INTRAVENOUS | Status: DC | PRN
  Administered 2020-10-20 – 2020-10-24 (×4): 10 mg via INTRAVENOUS
  Filled 2020-10-14 (×5): qty 4

## 2020-10-14 NOTE — H&P (Addendum)
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  Of note, patient was recently hospitalized at [facility] from 8/3 until 8/5.  In the presentation patient presented with left-sided weakness.  MRI brain revealing acute stroke involving the left parieto-occipital region involving the right MCA and PCA watershed region.  There was a mild associated petechial hemorrhage.  Considering patient's INR on Coumadin was 1.8, patient was transitioned over to Pradaxa twice daily.  Patient was evaluated by neurology and it was felt that patient's stroke may have been cryptogenic secondary to possible undiagnosed atrial fibrillation or right-sided carotid siphon stenosis.  Considering patient's need for lifelong anticoagulation anyway, work-up for cryptogenic stroke was not pursued at the request of the patient and patient was discharged on 8/5 to the home of his brother  Patient states that since his discharge 8/5 he was slowly recovering however on 8/10 patient noticed that he suddenly began to experience worsening left hand weakness resulting in worsening contractures of his left hand.  Patient also and he noticed some associated weakness of the proximal left upper extremity with associated numbness that tracked all the way up to the left side of his neck.  Patient also noticed worsening left lateral visual field deficit.  Of  note patient additionally complains of associated headache over the past several days.  This headache is moderate but sometimes severe in intensity, waxing and waning in quality.  Headache is throbbing in quality and diffuse.  Due to these worsening symptoms patient presented again to [facility] emergency department today for evaluation.  Upon evaluation in the emergency department repeat MRI of the brain was performed revealing moderate or large infarction of the right MCA/PCA border zone with increasing cytotoxic edema and increasing petechial hemorrhage.  Case was discussed with neurology who stated they will come evaluate the patient in consultation.  Hospitalist group was then called to assess patient for admission to the hospital.    Review of Systems:   Review of Systems  Eyes:  Positive for blurred vision.  Neurological:  Positive for focal weakness and headaches.  All other systems reviewed and are negative.  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 He  has been smoking an average of .5 packs per day. He has never used smokeless tobacco. He reports that he does not drink alcohol and does not use drugs.  Allergies  Allergen Reactions   Apixaban     Other reaction(s): Liver enzymes abnormal    Lisinopril     Other reaction(s): Cough (finding)   Simvastatin     Other reaction(s): Pain in lower limb (finding)    Family History  Problem Relation Age of Onset   Clotting disorder Neg Hx      Prior to Admission medications   Medication Sig Start Date End Date Taking? Authorizing Provider  atorvastatin (LIPITOR) 80 MG tablet Take 40 mg by mouth 1 day or 1 dose. 12/04/19  Yes [provider]  dabigatran (PRADAXA) 150 MG CAPS capsule Take 1 capsule (150 mg total) by mouth 2 (two) times daily. 10/08/20  Yes Elgergawy, Silver Huguenin, MD  finasteride (PROSCAR) 5 MG tablet Take 5 mg by mouth daily at 6 (six) AM. 08/05/20  Yes [provider]  hydrochlorothiazide (HYDRODIURIL) 25 MG tablet Take 25 mg by mouth daily.   Yes [provider]  losartan (COZAAR) 50 MG tablet Take 100 mg by mouth daily.   Yes [provider]  potassium chloride SA (KLOR-CON) 20 MEQ tablet Take 10 mEq by mouth daily. 12/04/19  Yes [provider]  psyllium (METAMUCIL) 58.6 % packet Take 1 packet by mouth daily. 04/08/20  Yes [provider]    Physical Exam: Vitals:   10/14/20 2000 10/14/20 2030 10/14/20 2100 10/14/20 2130  BP: (!) 170/100 (!) 154/77 (!) 177/65 (!) 164/69  Pulse: 62 68 63 68  Resp: '17 16 20 16  '$ Temp:      TempSrc:      SpO2: 100% 100% 100% 98%  Weight:      Height:        Constitutional: Awake alert and oriented x3, no associated distress.   Skin: no rashes, no lesions, good skin turgor noted. Eyes: Pupils are equally reactive to light.  No evidence of scleral icterus or conjunctival pallor.  ENMT: Moist mucous membranes noted.  Posterior pharynx clear of any exudate or lesions.   Neck: normal, supple, no masses, no thyromegaly.  No evidence of jugular venous distension.   Respiratory: clear to auscultation bilaterally, no wheezing, no crackles. Normal respiratory effort. No accessory muscle use.  Cardiovascular: Regular rate and rhythm, no  murmurs / rubs / gallops. No extremity edema. 2+ pedal pulses. No carotid bruits.  Chest:   Nontender without crepitus or deformity.   Back:   Nontender without crepitus or deformity. Abdomen: Abdomen is soft and nontender.  No evidence of intra-abdominal masses.  Positive bowel sounds noted in all quadrants.   Musculoskeletal: Notable contractures of the left hand.  No joint deformity upper and lower extremities. Good ROM.   Normal muscle tone.  Neurologic: Patient exhibiting substantial weakness of the distal muscle groups of the left upper extremity with mild weakness of the left proximal extremity.  Notable left lateral visual field deficit of the left eye.  Sensation intact.  Patient is following all commands.  Patient is responsive to verbal stimuli.   Psychiatric: Patient exhibits normal mood with appropriate affect.  Patient seems to possess insight as to their current situation.     Labs on Admission: I have personally reviewed following labs and imaging studies -   CBC: Recent Labs  Lab 10/08/20 0317 10/14/20 0913 10/14/20 0920  WBC 5.6  --  4.3  NEUTROABS  --   --  2.6  HGB 12.1* 14.3 13.5  HCT 38.7* 42.0 42.1  MCV 82.7  --  81.6  PLT 174  --  [id number]   Basic Metabolic Panel: Recent Labs  Lab 10/08/20 0317 10/14/20 0913 10/14/20 0920  NA 137 140 137  K 3.9 3.6 3.7  CL 104 104 104  CO2 27  --  22  GLUCOSE 96 118* 117*  BUN '22 19 17  '$ CREATININE 1.37* 1.20 1.30*  CALCIUM 8.5*  --  9.2   GFR: Estimated Creatinine Clearance: 55.4 mL/min (A) (by C-G formula based on SCr of 1.3 mg/dL (H)). Liver Function Tests: Recent Labs  Lab 10/14/20 0920  AST 22  ALT 13  ALKPHOS 77  BILITOT 1.0  PROT 7.1  ALBUMIN 3.6   No results for input(s): LIPASE, AMYLASE in the last 168 hours. No results for input(s): AMMONIA in the last 168 hours. Coagulation Profile: Recent Labs  Lab 10/08/20 0641 10/08/20 1451 10/14/20 0920  INR 2.1* 2.0* 1.4*   Cardiac Enzymes: No results  for input(s): CKTOTAL, CKMB, CKMBINDEX, TROPONINI in the last 168 hours. BNP (last 3 results) No results for input(s): PROBNP in the last 8760 hours. HbA1C: No results for input(s): HGBA1C in the last 72 hours. CBG: No results for input(s): GLUCAP in the last 168 hours. Lipid Profile: No results for input(s): CHOL, HDL, LDLCALC, TRIG, CHOLHDL, LDLDIRECT in the last 72 hours. Thyroid Function Tests: No results for input(s): TSH, T4TOTAL, FREET4, T3FREE, THYROIDAB in the last 72 hours. Anemia Panel: No results for input(s): VITAMINB12, FOLATE, FERRITIN, TIBC, IRON, RETICCTPCT in the last 72 hours. Urine analysis:    Component Value Date/Time   COLORURINE YELLOW 10/06/2020 1353   APPEARANCEUR CLEAR 10/06/2020 1353   LABSPEC 1.020 10/06/2020 1353   PHURINE 6.0 10/06/2020 1353   GLUCOSEU NEGATIVE 10/06/2020 1353   HGBUR TRACE (A) 10/06/2020 1353   BILIRUBINUR NEGATIVE 10/06/2020 1353   KETONESUR NEGATIVE 10/06/2020 1353   PROTEINUR NEGATIVE 10/06/2020 1353   NITRITE NEGATIVE 10/06/2020 1353   LEUKOCYTESUR SMALL (A) 10/06/2020 1353    Radiological Exams on Admission - Personally Reviewed: CT HEAD WO CONTRAST (5MM)  Result Date: 10/14/2020 CLINICAL DATA:  Right-sided headache, recent stroke EXAM: CT HEAD WITHOUT CONTRAST TECHNIQUE: Contiguous axial images were obtained from the base of the skull through the vertex without intravenous contrast. COMPARISON:  10/06/2020 CT head, 10/07/2020 MRI head FINDINGS: Brain: Area of hypodensity in the right parietal and occipital lobes, which correlates with the area of infarct seen on the 10/06/2020 CT and 10/07/2020 MRI, with edema in this area. Subtle areas of hyperdensity, likely petechial hemorrhage. No evidence of hemorrhagic transformation. No new areas of infarction. No significant midline shift. No mass or extra-axial collection. Vascular: No hyperdense vessel or unexpected calcification. Skull: Normal. Negative for fracture or focal lesion.  Sinuses/Orbits: Mucosal thickening in the left frontal sinus and bilateral ethmoid air cells. The orbits are unremarkable. Other: None. IMPRESSION: Expected evolution of the previously noted right MCA/PCA territory infarct, with petechial hemorrhage and mild edema, without midline shift or hemorrhagic transformation. Electronically Signed   By: Merilyn Baba MD   On: 10/14/2020 10:02   MR BRAIN WO CONTRAST  Result Date: 10/14/2020 CLINICAL DATA:  Stroke, follow up.  Eval for progression of stroke. EXAM: MRI HEAD WITHOUT CONTRAST TECHNIQUE: Multiplanar, multiecho pulse sequences of the brain and surrounding structures were obtained without intravenous contrast. COMPARISON:  Head CT 10/14/2020 and MRI 10/07/2020 FINDINGS: The  study is intermittently motion degraded including severe motion on the coronal T2 sequence. Brain: The moderately large infarct involving the posterior right MCA territory and PCA border zone has mildly expanded compared to the prior MRI, most notable anteriorly and superiorly. Associated petechial hemorrhage is partially confluent and has increased from the prior MRI. There is progressive cytotoxic edema with regional sulcal effacement and mass effect on the right lateral ventricle. New mildly restricted diffusion in the splenium of the corpus callosum may reflect acute wallerian degeneration. Small cortical and subcortical infarcts in the right frontal lobe (border zone distribution) have also mildly increased from the prior MRI. Small T2 hyperintensities elsewhere in the cerebral white matter bilaterally are similar to the prior MRI and are nonspecific but compatible with mild chronic small vessel ischemic disease. There is no midline shift or extra-axial fluid collection. A chronic hemorrhagic infarct is again noted in the posterior aspect of the left lentiform nucleus/external capsule. Vascular: Major intracranial vascular flow voids are preserved. Skull and upper cervical spine:  Unremarkable bone marrow signal para Sinuses/Orbits: Unremarkable orbits. Mild mucosal thickening in the paranasal sinuses. Clear mastoid air cells. Other: None. IMPRESSION: 1. Mild enlargement of the right MCA/border zone infarct since the 10/07/2020 MRI. Increased cytotoxic edema and petechial hemorrhage without midline shift. 2. Chronic small-vessel ischemia including an old hemorrhagic infarct in the left basal ganglia. Electronically Signed   By: Logan Bores M.D.   On: 10/14/2020 18:22    EKG: Personally reviewed.  Rhythm is sinus bradycardia with heart rate of 54 bpm.  No dynamic ST segment changes appreciated.  Assessment/Plan Principal Problem: Left-sided weakness secondary to worsening right parietal-occipital MCA/PCA watershed stroke complicated by worsening petechial hemorrhage  Patient exhibiting what seems to be worsening left hand weakness and left lateral visual field deficit likely secondary to extension of already known right parietoccipital MCA/PCA stroke. Area of insult complicated by.  Increasing cytotoxic edema and increasing petechial hemorrhage Considering increasing petechial hemorrhage targeting systolic blood pressures of [id number] systolic with as needed intravenous labetalol Holding all anticoagulation for now Serial neurologic checks Monitoring patient on telemetry Repeat physical therapy and Occupational Therapy assessments tomorrow -patient certainly would benefit from inpatient or subacute rehabilitation Neurology has been consulted by the emergency department staff.  Their note is pending and their input is certainly appreciated. Hemoglobin A1c and lipid panel have already been performed earlier this month. I don't believe a repeat echocardiogram will contribute to this patient's work-up unless neurology recommends otherwise  Active Problems:    Essential hypertension  Holding home scheduling hypertensives temporarily Target systolic blood pressure between [id number]  and 0000000 systolic considering increasing petechial hemorrhage with as needed intravenous antihypertensives    Mixed hyperlipidemia  Continue atorvastatin 40 mg daily Lipid panel has already been performed earlier this month    History of pulmonary embolism with chronic anticoagulation  Patient was transitioned from Coumadin to Pradaxa during the last hospitalization in an effort to ensure patient also received consistent therapeutic levels of anticoagulant Temporarily holding anticoagulation due to increasing petechial hemorrhage    Nicotine dependence, cigarettes, uncomplicated  Counseled patient on cessation daily    Prediabetes Last hemoglobin A1c 5.9 percent noted during last hospitalization     Code Status:  Full code Family Communication: deferred   Status is: Inpatient  Patient requires inpatient hospitalization and will require at least 2 midnights due to ongoing adjustment of intravenous medications,  expert consultation with neurology and frequent neurologic checks.  Dispo: The patient is from: Home  Anticipated d/c is to: SNF              Patient currently is not medically stable to d/c.   Difficult to place patient Yes        Vernelle Emerald MD [facility] Pager [phone]  If 7PM-7AM, please contact night-coverage [email or web address] Use universal [place] password for that web site. If you do not have the password, please call the hospital operator.  10/14/2020, 9:59 PM

## 2020-10-14 NOTE — ED Provider Notes (Signed)
I saw and evaluated the patient, reviewed the resident's note and I agree with the findings and plan.  Pertinent History: This patient was seen in conjunction and assistance with physician assistant Florene Glen, the patient was recently admitted to the hospital after having a stroke, he had some left-sided deficits at that time, he states that yesterday he had a more dense hemiparesis of his left upper extremity now not able to even buttoning shirt which she was able to do before.  Pertinent Exam findings: The patient is hypertensive 180/70, his EKG shows a sinus bradycardia, he has clear heart sounds otherwise with no edema.  He is able to lift both legs with normal strength and sensation, he is able to lift both arms but with his left upper extremity he has weakness of the hand inability to do finger-nose-finger, significant dysmetria, there is a sensory deficit of the left arm and there is a left hemianopsia with visual acuity.  I was personally present and directly supervised the following procedures:  Medical evaluation and resuscitation Consultation with neurology  I personally interpreted the EKG as well as the resident and agree with the interpretation on the resident's chart.  Final diagnoses:  None      Noemi Chapel, MD 10/16/20 1235

## 2020-10-14 NOTE — ED Triage Notes (Signed)
Pt presents today with R sided headache for the past few days. States that his family told him "he's not acting like himself". Pt recently admitted for stroke with residual L sided weakness. Pt denies vision changes, nausea.

## 2020-10-14 NOTE — ED Notes (Signed)
Patient transported to MRI 

## 2020-10-14 NOTE — ED Notes (Signed)
Notified Dr Roslynn Amble pt was brady down to 38. Placed pt on zoll

## 2020-10-14 NOTE — ED Provider Notes (Signed)
[facility] EMERGENCY DEPARTMENT Provider Note   CSN: [id number] Arrival date & time: 10/14/20  [id number]     History Chief Complaint  Patient presents with   Altered Mental Status    Clifford [name] is a 70 y.o. male.   Altered Mental Status Associated symptoms: weakness   Associated symptoms: no abdominal pain, no fever, no headaches, no light-headedness, no palpitations, no rash, no seizures and no vomiting    70 year old male with a past medical history of hypertension hyperlipidemia, renal cancer, pulmonary embolism currently on Pradaxa, recent cryptogenic stroke presenting to the emergency department with left upper extremity weakness.  Patient reports that since yesterday afternoon, he has had left hand weakness and left visual field deficits.  He states that the symptoms are similar to when he presented originally with his stroke to the hospital last week.  The patient reports that he has been adherent with his anticoagulation medications and everything since discharge.  He notes that he was taken off of his blood pressure medicine after leaving the hospital.  Past Medical History:  Diagnosis Date   BPH (benign prostatic hyperplasia)    HLD (hyperlipidemia)    HTN (hypertension)    Lung nodule    42m in 2015   Malignant [place] of [place]    Peripheral vascular disease ([place]    Polyp of colon    Pulmonary embolus ([facility]     Patient Active Problem List   Diagnosis Date Noted   Left-sided weakness 10/14/2020   Prediabetes 10/14/2020   [place] 10/06/[place]   Essential hypertension 10/06/2020   Mixed hyperlipidemia 10/06/2020   Smoking 10/06/2020    Past Surgical History:  Procedure Laterality Date   COLONOSCOPY  03/2017   PARTIAL NEPHRECTOMY Left 02/2018   PROSTATE BIOPSY  2015       Family History  Problem Relation Age of Onset   Clotting disorder Neg Hx     Social History   Tobacco Use   Smoking status: Every Day     Packs/day: 0.50    Types: Cigarettes   Smokeless tobacco: Never  Vaping Use   Vaping Use: Never used  Substance Use Topics   Alcohol use: Never   Drug use: Never    Home Medications Prior to Admission medications   Medication Sig Start Date End Date Taking? Authorizing Provider  atorvastatin (LIPITOR) 80 MG tablet Take 40 mg by mouth 1 day or 1 dose. 12/04/19  Yes [provider]  dabigatran (PRADAXA) 150 MG CAPS capsule Take 1 capsule (150 mg total) by mouth 2 (two) times daily. 10/08/20  Yes Elgergawy, DSilver Huguenin MD  finasteride (PROSCAR) 5 MG tablet Take 5 mg by mouth daily at 6 (six) AM. 08/05/20  Yes [provider]  hydrochlorothiazide (HYDRODIURIL) 25 MG tablet Take 25 mg by mouth daily.   Yes [provider]  losartan (COZAAR) 50 MG tablet Take 100 mg by mouth daily.   Yes [provider]  potassium chloride SA (KLOR-CON) 20 MEQ tablet Take 10 mEq by mouth daily. 12/04/19  Yes [provider]  psyllium (METAMUCIL) 58.6 % packet Take 1 packet by mouth daily. 04/08/20  Yes [provider]    Allergies    Apixaban, Lisinopril, and Simvastatin  Review of Systems   Review of Systems  Constitutional:  Negative for chills and fever.  HENT:  Negative for ear pain and sore throat.   Eyes:  Negative for pain and visual disturbance.  Respiratory:  Negative for cough and shortness of breath.   Cardiovascular:  Negative for chest pain and palpitations.  Gastrointestinal:  Negative for abdominal pain and vomiting.  Genitourinary:  Negative for dysuria and hematuria.  Musculoskeletal:  Negative for arthralgias and back pain.  Skin:  Negative for color change and rash.  Neurological:  Positive for weakness and numbness. Negative for dizziness, tremors, seizures, syncope, facial asymmetry, speech difficulty, light-headedness and headaches.  All other systems reviewed and are negative.  Physical Exam Updated Vital Signs BP (!) 186/100    Pulse 60   Temp (!) 97.5 F (36.4 C) (Oral)   Resp 18   Ht '5\' 5"'$  (1.651 m)   Wt 95.7 kg   SpO2 99%   BMI 35.11 kg/m   Physical Exam Vitals and nursing note reviewed.  Constitutional:      General: He is not in acute distress.    Appearance: Normal appearance. He is well-developed and normal weight. He is not ill-appearing or toxic-appearing.  HENT:     Head: Normocephalic and atraumatic.  Eyes:     Conjunctiva/sclera: Conjunctivae normal.  Cardiovascular:     Rate and Rhythm: Normal rate and regular rhythm.     Heart sounds: No murmur heard. Pulmonary:     Effort: Pulmonary effort is normal. No respiratory distress.     Breath sounds: Normal breath sounds.  Abdominal:     Palpations: Abdomen is soft.     Tenderness: There is no abdominal tenderness.  Musculoskeletal:     Cervical back: Neck supple.  Skin:    General: Skin is warm and dry.     Capillary Refill: Capillary refill takes less than 2 seconds.  Neurological:     Mental Status: He is alert.     Comments: No facial asymmetry.  Patient has left-sided visual field cuts, superior and inferior.  4-5 strength in the left upper extremity grip when compared to the right.  Patient also neglecting his left upper extremity, requires significant prompting to activate his left upper extremity.  Strength in the lower extremities is equal.   ED Results / Procedures / Treatments   Labs (all labs ordered are listed, but only abnormal results are displayed) Labs Reviewed  PROTIME-INR - Abnormal; Notable for the following components:      Result Value   Prothrombin Time 17.5 (*)    INR 1.4 (*)    All other components within normal limits  APTT - Abnormal; Notable for the following components:   aPTT 82 (*)    All other components within normal limits  COMPREHENSIVE METABOLIC PANEL - Abnormal; Notable for the following components:   Glucose, Bld 117 (*)    Creatinine, Ser 1.30 (*)    GFR, Estimated 59 (*)    All other  components within normal limits  I-STAT CHEM 8, ED - Abnormal; Notable for the following components:   Glucose, Bld 118 (*)    Calcium, Ion 1.13 (*)    All other components within normal limits  RESP PANEL BY RT-PCR (FLU A&B, COVID) ARPGX2  CBC  DIFFERENTIAL  CBG MONITORING, ED    EKG EKG Interpretation  Date/Time:  Thursday October 14 2020 08:45:14 EDT Ventricular Rate:  54 PR Interval:  188 QRS Duration: 72 QT Interval:  424 QTC Calculation: 402 R Axis:   9 Text Interpretation: Sinus bradycardia Otherwise normal ECG since last tracing no significant change Confirmed by Noemi Chapel [phone]) on 10/14/2020 3:03:48 PM  Radiology CT HEAD WO CONTRAST (5MM)  Result Date: 10/14/2020 CLINICAL DATA:  Right-sided headache, recent stroke EXAM: CT HEAD WITHOUT CONTRAST TECHNIQUE: Contiguous axial images were obtained from the base of the skull through the vertex without intravenous contrast. COMPARISON:  10/06/2020 CT head, 10/07/2020 MRI head FINDINGS: Brain: Area of hypodensity in the right parietal and occipital lobes, which correlates with the area of infarct seen on the 10/06/2020 CT and 10/07/2020 MRI, with edema in this area. Subtle areas of hyperdensity, likely petechial hemorrhage. No evidence of hemorrhagic transformation. No new areas of infarction. No significant midline shift. No mass or extra-axial collection. Vascular: No hyperdense vessel or unexpected calcification. Skull: Normal. Negative for fracture or focal lesion. Sinuses/Orbits: Mucosal thickening in the left frontal sinus and bilateral ethmoid air cells. The orbits are unremarkable. Other: None. IMPRESSION: Expected evolution of the previously noted right MCA/PCA territory infarct, with petechial hemorrhage and mild edema, without midline shift or hemorrhagic transformation. Electronically Signed   By: Merilyn Baba MD   On: 10/14/2020 10:02   MR BRAIN WO CONTRAST  Result Date: 10/14/2020 CLINICAL DATA:  Stroke, follow up.   Eval for progression of stroke. EXAM: MRI HEAD WITHOUT CONTRAST TECHNIQUE: Multiplanar, multiecho pulse sequences of the brain and surrounding structures were obtained without intravenous contrast. COMPARISON:  Head CT 10/14/2020 and MRI 10/07/2020 FINDINGS: The study is intermittently motion degraded including severe motion on the coronal T2 sequence. Brain: The moderately large infarct involving the posterior right MCA territory and PCA border zone has mildly expanded compared to the prior MRI, most notable anteriorly and superiorly. Associated petechial hemorrhage is partially confluent and has increased from the prior MRI. There is progressive cytotoxic edema with regional sulcal effacement and mass effect on the right lateral ventricle. New mildly restricted diffusion in the splenium of the corpus callosum may reflect acute wallerian degeneration. Small cortical and subcortical infarcts in the right frontal lobe (border zone distribution) have also mildly increased from the prior MRI. Small T2 hyperintensities elsewhere in the cerebral white matter bilaterally are similar to the prior MRI and are nonspecific but compatible with mild chronic small vessel ischemic disease. There is no midline shift or extra-axial fluid collection. A chronic hemorrhagic infarct is again noted in the posterior aspect of the left lentiform nucleus/external capsule. Vascular: Major intracranial vascular flow voids are preserved. Skull and upper cervical spine: Unremarkable bone marrow signal para Sinuses/Orbits: Unremarkable orbits. Mild mucosal thickening in the paranasal sinuses. Clear mastoid air cells. Other: None. IMPRESSION: 1. Mild enlargement of the right MCA/border zone infarct since the 10/07/2020 MRI. Increased cytotoxic edema and petechial hemorrhage without midline shift. 2. Chronic small-vessel ischemia including an old hemorrhagic infarct in the left basal ganglia. Electronically Signed   By: Logan Bores M.D.   On:  10/14/2020 18:22    Procedures Procedures   Medications Ordered in ED Medications  acetaminophen (TYLENOL) tablet 1,000 mg (has no administration in time range)    ED Course  I have reviewed the triage vital signs and the nursing notes.  Pertinent labs & imaging results that were available during my care of the patient were reviewed by me and considered in my medical decision making (see chart for details).    MDM Rules/Calculators/A&P                           70 year old male with above past medical history and recent cryptogenic right MCA stroke 1 week ago presenting to the emergency department with worsening deficits in the  left upper extremity.  Vital signs reviewed, the patient is hypertensive but otherwise within acceptable limits.  Physical exam is notable for neglect of the left upper extremity as well as significant left visual field deficits.  Differential includes hypoglycemia, Todd's paralysis, new stroke, reactivation of old deficits, hemorrhagic conversion.  CT head obtained in triage demonstrates some evolution of the previous stroke with petechial hemorrhage and edema.  CBC shows no leukocytosis, no hemoglobin changes.  Creatinine is at baseline.  I discussed the case with neurology and given his worsening deficits, they recommended obtaining an MRI.  MRI obtained, shows some new cytotoxic edema as well as worsening of the petechial hemorrhages previously seen on imaging.  Speaking with the brother at bedside, it sounds like the patient was supposed to go to rehabilitation from his hospital discharge, but because he is from [place] and plugged in with the [place] they were not able to arrange rehabilitation from hospital discharge.  The brother describes the patient bumping into objects due to his visual deficits and having difficulty walking, brother reports the patient being unsafe at home.  Patient is also on active coagulation for pulmonary embolism and has worsening petechial  hemorrhage.  The patient was also taken off antihypertensives at last admission, but brother reports the [place] physician wanted to restart all of them yesterday.  Given his unclear antihypertensive regimen, it is possible worsening findings on MRI, neurology agreed with admission for observation, as well as to hopefully facilitate rehabilitation for the patient.  Handoff given to admitting team.  Final Clinical Impression(s) / ED Diagnoses Final diagnoses:  Weakness    Rx / DC Orders ED Discharge Orders     None        Claud Kelp, MD 10/14/20 2023    Noemi Chapel, MD 10/16/20 1234

## 2020-10-14 NOTE — Consult Note (Signed)
Neurology Consultation Reason for Consult: Worsening stroke symptoms Requesting Physician: Inda Merlin  CC: Worsening stroke symptoms  History is obtained from:Patient and chart review  HPI: Clifford [name] is a 70 y.o. 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  Regarding his recent stroke, his NIH stroke scale was 3 for left hemianopia and left arm drift; on full examination a mild left facial weakness was also described and 4+/5 strength of the left arm and leg as well as a mild left motor neglect.  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  He reports that his last pulmonary embolus was 1 to 2 years ago and he has been on anticoagulation since  Due to low blood pressures during his hospitalization off of his home antihypertensive medications (HCTZ 25 QD and Losartan 100 mg QD) so these were discontinued on discharge.  He now presents with systolic blood pressures ranging from 160s to 198 over 60s to 100s; vitals are additionally notable for asymptomatic bradycardia to 38 at around 3 PM (mentioned in nursing notes, not charted)  He reports that he was overall improving from his stroke symptoms after discharge.  However yesterday he began to have worsening weakness of the hand noticing he was no longer able to button his buttons and the hand also felt  easily into him.  In addition his headache which had improved almost completely returned with severity although now it is again improving.  He also felt like he had worsening vision issues and walked into a sign that was on his left side.  On examination he was noted to have left thumb rhythmic and nonsuppressible shaking movements which she reports he has not previously noticed.     LKW: 10/04/2020 tPA given?: No, out of the window and petechial hemorrhage Premorbid modified rankin scale: 1-2     1 - No significant disability. Able to carry out all usual activities, despite some symptoms.     2 - Slight disability. Able to look after own affairs without assistance, but unable to carry out all previous activities.  ICH Score: 0  Time performed: 8/11 11:13 PM GCS: 13-15 is 0 points Infratentorial: No.. If yes, 1 point -- 0  Volume: <30cc is 0 points  Age: 70 y.o.. >80 is 1 point -- 0 Intraventricular extension is 1 point -- 0   Score:0 A Score of 0 points has a 30 day mortality of 0%. Stroke. 2001 Apr;32(4):891-7.    ROS: All other review of systems was negative except as noted in the HPI.   Past Medical History:  Diagnosis Date   BPH (benign prostatic hyperplasia)    HLD (hyperlipidemia)    HTN (hypertension)    Lung nodule    15m in 2015   Malignant neoplasm of left kidney (HCC)    Peripheral vascular disease (H[facility])    Polyp of colon    Pulmonary embolus ([facility]    Past Surgical History:  Procedure Laterality Date   COLONOSCOPY  03/2017   PARTIAL NEPHRECTOMY Left 02/2018   PROSTATE BIOPSY  2015   No current facility-administered medications for this encounter.  Current Outpatient Medications:    atorvastatin (LIPITOR) 80 MG tablet, Take 40 mg by mouth 1 day or 1 dose., Disp: , Rfl:    dabigatran (PRADAXA) 150 MG CAPS capsule, Take 1 capsule (150 mg total) by mouth 2 (two) times daily., Disp: 60 capsule, Rfl: 0   finasteride (PROSCAR) 5 MG tablet, Take 5 mg by mouth daily at  6 (six) AM., Disp: , Rfl:    hydrochlorothiazide (HYDRODIURIL) 25 MG tablet, Take 25 mg by mouth daily., Disp: , Rfl:    losartan (COZAAR) 50 MG tablet, Take 100 mg by mouth daily., Disp: , Rfl:    potassium chloride SA (KLOR-CON) 20 MEQ tablet, Take 10 mEq by mouth daily., Disp: , Rfl:    psyllium (METAMUCIL) 58.6 % packet, Take 1 packet by mouth daily., Disp: , Rfl:    Family History  Problem Relation Age of Onset   Clotting disorder Neg Hx     Social History:  reports that he has been smoking cigarettes. He has been smoking an average of .5 packs per day. He has never used smokeless tobacco. He reports that he does not drink alcohol and does not use drugs.   Exam: Current vital signs: BP (!) 182/60   Pulse (!) 54   Temp (!) 97.5 F (36.4 C) (Oral)   Resp 16   Ht '5\' 5"'$  (1.651 m)   Wt 95.7 kg   SpO2 98%   BMI 35.11 kg/m  Vital signs in last 24 hours: Temp:  [97.5 F (36.4 C)-99.2 F (37.3 C)] 97.5 F (36.4 C) (08/11 1606) Pulse Rate:  [43-59] 54 (08/11 1803) Resp:  [10-20] 16 (08/11 1848) BP: (111-198)/(60-87) 182/60 (08/11 1848) SpO2:  [97 %-100 %] 98 % (08/11 1803) Weight:  [95.7 kg] 95.7 kg (08/11 0840)   Physical Exam  Constitutional: Appears well-developed and well-nourished.  Psych: Affect slightly flat, calm and cooperative Eyes: No scleral injection HENT: No oropharyngeal obstruction.  MSK: no joint deformities.  Cardiovascular: Normal rate and regular rhythm.  Respiratory: Effort normal, non-labored breathing GI: Soft.  No distension. There is no tenderness.  Skin: Warm dry and intact visible skin  Neuro: Mental Status: Patient is awake, alert, oriented to person, place, month, year, and situation. Patient is able to give a clear and coherent history Mild motor neglect to the left side Cranial Nerves: II: Visual Fields are notable for left hemianopia. Pupils are equal, round, and reactive to light.   III,IV, VI: EOMI without ptosis or diploplia.   Saccadic pursuits V: Facial sensation is symmetric to temperature VII: Facial movement is notable for slightly delayed activation of the left face but symmetric.  VIII: hearing is intact to voice X: Uvula elevates symmetrically XI: Shoulder shrug is symmetric. XII: tongue is midline without atrophy or fasciculations.  Motor: Tone is normal. Bulk is normal. 5/5 strength was present throughout the right side.  On the left upper extremity he had weakness of the deltoids (3), elbow flexion 4+, elbow extension 4, wrist flexion 4, wrist extension 2, finger flexion 4, finger extension 2.  On the left lower extremity he had mild foot dorsiflexion weakness 4, but hip flexion, knee flexion and knee extension were intact.  Rhythmic twitching of the left thumb occasionally spreading into the fingers was observed.  This was nonsuppressible and the patient did not notice it was ongoing until it  was pointed out Sensory: He had numbness of the left hand and the back of the left triceps Deep Tendon Reflexes: 2+ and symmetric in the biceps and patellae.  Plantars: Toes are downgoing bilaterally.  Cerebellar: FNF and HKS are intact bilaterally  NIHSS total 5 Score breakdown: 2 points for left hemianopia, one-point for left upper extremity drift but did not hit the bed, one-point for sensory loss of the left triceps area and hand, one-point for mild neglect    I have reviewed labs in epic and the results pertinent to this consultation are: Creatinine at his baseline of 1.3 (GFR 59), mildly elevated glucose at 117 CBC within normal limits Mildly elevated PT (17.5), INR (1.4), APTT (82)  Lab Results  Component Value Date   CHOL 128 10/07/2020   HDL 40 (L) 10/07/2020   LDLCALC 75 10/07/2020   TRIG 63 10/07/2020   CHOLHDL 3.2 10/07/2020   Lab Results  Component Value Date   HGBA1C 5.9 (H) 10/07/2020     I have reviewed the images obtained:  MRI brain from October 14, 2020 personally reviewed and  compared to October 07, 2020 Agree with radiology that there has been interval increase in hemorrhage but this remains petechial without significant mass-effect though there has been increased cytotoxic edema   Impression: 69 year old male with recent right parietal MCA/PCA watershed territory embolic appearing stroke of undetermined source (atrial fibrillation versus possibly carotid siphon disease), now presenting with worsening petechial hemorrhage in the setting of using Pradaxa as well as focal motor seizure.  While he denies any significant shaking movements of the left side, given the neglect he has he may have not realize he was having more frequent or even larger seizures than I observed on examination.  This certainly may be contributing to the restricted diffusion seen on imaging though slight extension of his stroke is also on the differential.  Symptoms may also be secondary to seizure activity and may improve with seizure control.  EEG may not reveal seizure activity given the very subtle activity seen on my current examination however we will still obtain the study for completeness.    Additionally he does have a slightly increased petechial hemorrhage and he reports his blood pressure had largely been in the 120s over 80s at home but then does not remember his blood pressures yesterday when his symptoms became worse.  Here he has been quite hypertensive which could certainly contribute to the petechial hemorrhage seen.  The hemorrhage may also be driving the seizure activity  Recommendations: -Hold anticoagulation overnight -Repeat head CT in the morning for stability -Keppra 1000 mg IV load, followed by 500 mg twice daily -Routine EEG -Blood pressure control, 140-160 overnight to avoid too rapid of drop.  Long-term blood pressure goal is normotension -Repeat PT/OT evaluation given worsening hand weakness -Stroke team to follow  [place] [phone] Available 7 PM to 7 AM, outside of these hours please call Neurologist on call as listed on Amion.

## 2020-10-14 NOTE — ED Notes (Signed)
Gave pt a [place] bag, pop and apple juice

## 2020-10-15 ENCOUNTER — Inpatient Hospital Stay (HOSPITAL_COMMUNITY): Payer: No Typology Code available for payment source

## 2020-10-15 DIAGNOSIS — I6389 Other cerebral infarction: Secondary | ICD-10-CM

## 2020-10-15 DIAGNOSIS — I639 Cerebral infarction, unspecified: Secondary | ICD-10-CM | POA: Diagnosis not present

## 2020-10-15 DIAGNOSIS — R531 Weakness: Secondary | ICD-10-CM | POA: Diagnosis not present

## 2020-10-15 DIAGNOSIS — Z86711 Personal history of pulmonary embolism: Secondary | ICD-10-CM | POA: Diagnosis not present

## 2020-10-15 DIAGNOSIS — Z7901 Long term (current) use of anticoagulants: Secondary | ICD-10-CM

## 2020-10-15 DIAGNOSIS — I10 Essential (primary) hypertension: Secondary | ICD-10-CM

## 2020-10-15 DIAGNOSIS — R569 Unspecified convulsions: Secondary | ICD-10-CM | POA: Diagnosis not present

## 2020-10-15 DIAGNOSIS — G40109 Localization-related (focal) (partial) symptomatic epilepsy and epileptic syndromes with simple partial seizures, not intractable, without status epilepticus: Secondary | ICD-10-CM

## 2020-10-15 LAB — COMPREHENSIVE METABOLIC PANEL
ALT: 12 U/L (ref 0–44)
AST: 19 U/L (ref 15–41)
Albumin: 3 g/dL — ABNORMAL LOW (ref 3.5–5.0)
Alkaline Phosphatase: 65 U/L (ref 38–126)
Anion gap: 10 (ref 5–15)
BUN: 18 mg/dL (ref 8–23)
CO2: 23 mmol/L (ref 22–32)
Calcium: 8.9 mg/dL (ref 8.9–10.3)
Chloride: 102 mmol/L (ref 98–111)
Creatinine, Ser: 1.22 mg/dL (ref 0.61–1.24)
GFR, Estimated: >60 mL/min (ref >60)
Glucose, Bld: 86 mg/dL (ref 70–99)
Potassium: 3.6 mmol/L (ref 3.5–5.1)
Sodium: 135 mmol/L (ref 135–145)
Total Bilirubin: 0.8 mg/dL (ref 0.3–1.2)
Total Protein: 6.2 g/dL — ABNORMAL LOW (ref 6.5–8.1)

## 2020-10-15 LAB — CBC WITH DIFFERENTIAL/PLATELET
Abs Immature Granulocytes: 0.01 10*3/uL (ref 0.00–0.07)
Basophils Absolute: 0 10*3/uL (ref 0.0–0.1)
Basophils Relative: 1 %
Eosinophils Absolute: 0.2 10*3/uL (ref 0.0–0.5)
Eosinophils Relative: 5 %
HCT: 37.9 % — ABNORMAL LOW (ref 39.0–52.0)
Hemoglobin: 11.8 g/dL — ABNORMAL LOW (ref 13.0–17.0)
Immature Granulocytes: 0 %
Lymphocytes Relative: 37 %
Lymphs Abs: 1.6 10*3/uL (ref 0.7–4.0)
MCH: 26.1 pg (ref 26.0–34.0)
MCHC: 31.1 g/dL (ref 30.0–36.0)
MCV: 83.8 fL (ref 80.0–100.0)
Monocytes Absolute: 0.6 10*3/uL (ref 0.1–1.0)
Monocytes Relative: 14 %
Neutro Abs: 1.8 10*3/uL (ref 1.7–7.7)
Neutrophils Relative %: 43 %
Platelets: 193 10*3/uL (ref 150–400)
RBC: 4.52 MIL/uL (ref 4.22–5.81)
RDW: 15.3 % (ref 11.5–15.5)
WBC: 4.3 10*3/uL (ref 4.0–10.5)
nRBC: 0 % (ref 0.0–0.2)

## 2020-10-15 LAB — MAGNESIUM: Magnesium: 1.8 mg/dL (ref 1.7–2.4)

## 2020-10-15 MED ORDER — LABETALOL HCL 5 MG/ML IV SOLN: 20.0000 mg | Freq: Once | INTRAVENOUS | Status: DC

## 2020-10-15 MED ORDER — LEVETIRACETAM 500 MG PO TABS
1000.0000 mg | ORAL_TABLET | Freq: Two times a day (BID) | ORAL | Status: DC
  Administered 2020-10-15 – 2020-10-22 (×12): 1000 mg via ORAL
  Filled 2020-10-15 (×15): qty 2

## 2020-10-15 MED ORDER — BUTALBITAL-APAP-CAFFEINE 50-325-40 MG PO TABS
1.0000 | ORAL_TABLET | Freq: Three times a day (TID) | ORAL | Status: DC | PRN
  Administered 2020-10-15 – 2020-10-18 (×3): 1 via ORAL
  Filled 2020-10-15 (×3): qty 1

## 2020-10-15 MED ORDER — LEVETIRACETAM 500 MG PO TABS
500.0000 mg | ORAL_TABLET | Freq: Once | ORAL | Status: AC
  Administered 2020-10-15: 500 mg via ORAL
  Filled 2020-10-15: qty 1

## 2020-10-15 NOTE — Progress Notes (Signed)
Inpatient Rehab Admissions Coordinator Note:   Per therapy recommendations, pt was screened for CIR candidacy by Shann Medal, PT, DPT.  At this time we are recommending a CIR consult and I will place an order per our protocol.  Please contact me with questions.   Shann Medal, PT, DPT [phone] 10/15/20 3:14 PM

## 2020-10-15 NOTE — Progress Notes (Addendum)
STROKE TEAM PROGRESS NOTE   ATTENDING NOTE: I reviewed above note and agree with the assessment and plan. Pt was seen and examined.   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  Patient had a stroke in 10/2020 with vision changes and left-sided weakness.  CT and MRI showed right MCA/PCA watershed infarct.  CT head and neck showed right ICA siphon severe stenosis LDL 75, A1c 5.9.  He was on Coumadin for PE PTA, changed to Pradaxa and also discharged with Lipitor 40.  During this admission, CT and MRI showed expected evolution of right posterior MCA infarct with petechial hemorrhage and mild edema.  He was found to have left hand rhythmic twitching in the ED, started on Keppra 500 twice daily, EEG done today showed no seizure activity, however not sure whether EEG captured left hand rhythmic twitching activity.  Pradaxa was on hold due to hemorrhagic conversion.  Working with PT/OT this morning, patient had episode of increased left-sided weakness, right gaze with stable BP 138/67.  CT stat done showed no significant change.  On my examination this afternoon, patient awake alert, orientated to place, age, and time.  No aphasia, follows simple commands.  Left upper quadrantanopsia and lower quadrant simultanagnosia, no gaze palsy, facial symmetrical.  Left upper extremity drift proximal 4-/5, distal 2/5 with left fingers rhythmic twitching. RUE and BLEs 5/5. Sensation symmetrical, L FTN ataxic but not out of proportion to weakness.   Etiology for patient symptoms likely due to hemorrhagic transformation from right MCA stroke in the setting of Pradaxa use.  We will continue to hold Pradaxa, continue Lipitor.  Left hand rhythmic twitching concerning for simple partial seizure, not sure if captured on EEG, will consider overnight LTM.  Will increase Keppra from 500 twice daily to 1000  mg twice daily.  We will follow.  For detailed assessment and plan, please refer to above as I have made changes wherever appropriate.   Patient condition worsened within the last 24 hours, has developed episode of worsening your symptoms, continues to have left hand twitching, and I discussed with Dr. Waldron Labs. I spent  35 minutes in total face-to-face time with the patient, more than 50% of which was spent in counseling and coordination of care, reviewing test results, images and medication, and discussing the diagnosis, treatment plan and potential prognosis. This patient's care requiresreview of multiple databases, neurological assessment, discussion with family, other specialists and medical decision making of high complexity.  Rosalin Hawking, MD PhD Stroke Neurology 10/15/2020 6:54 PM    SUBJECTIVE (INTERVAL HISTORY) Overall his condition is unchanged.  Patient sitting up and eating breakfast, awake, alert and oriented.  Patient continues to exhibit left hand twitching/number at rest and with action along with weakness at the level of the wrist and hand.  Patient also endorses left upper extremity drift.   OBJECTIVE Temp:  [97.5 F (36.4 C)-98.2 F (36.8 C)] 98.2 F (36.8 C) (08/12 0725) Pulse Rate:  [40-68] 48 (08/12 0725) Cardiac Rhythm: Sinus bradycardia;Heart block (08/12 0522) Resp:  [10-21] 14 (08/12 0725) BP: (106-198)/(53-130) 130/62 (08/12 0725) SpO2:  [97 %-100 %] 100 % (08/12 0725)  No results for input(s): GLUCAP in the last 168 hours. Recent Labs  Lab 10/14/20 0913 10/14/20 0920 10/15/20 0500  NA 140 137 135  K 3.6 3.7 3.6  CL 104 104 102  CO2  --  22 23  GLUCOSE 118* 117*  86  BUN '19 17 18  '$ CREATININE 1.20 1.30* 1.22  CALCIUM  --  9.2 8.9  MG  --   --  1.8   Recent Labs  Lab 10/14/20 0920 10/15/20 0500  AST 22 19  ALT 13 12  ALKPHOS 77 65  BILITOT 1.0 0.8  PROT 7.1 6.2*  ALBUMIN 3.6 3.0*   Recent Labs  Lab 10/14/20 0913 10/14/20 0920  10/15/20 0500  WBC  --  4.3 4.3  NEUTROABS  --  2.6 1.8  HGB 14.3 13.5 11.8*  HCT 42.0 42.1 37.9*  MCV  --  81.6 83.8  PLT  --  227 193   No results for input(s): CKTOTAL, CKMB, CKMBINDEX, TROPONINI in the last 168 hours. Recent Labs    10/14/20 0920  LABPROT 17.5*  INR 1.4*   No results for input(s): COLORURINE, LABSPEC, PHURINE, GLUCOSEU, HGBUR, BILIRUBINUR, KETONESUR, PROTEINUR, UROBILINOGEN, NITRITE, LEUKOCYTESUR in the last 72 hours.  Invalid input(s): APPERANCEUR     Component Value Date/Time   CHOL 128 10/07/2020 0450   TRIG 63 10/07/2020 0450   HDL 40 (L) 10/07/2020 0450   CHOLHDL 3.2 10/07/2020 0450   VLDL 13 10/07/2020 0450   LDLCALC 75 10/07/2020 0450   Lab Results  Component Value Date   HGBA1C 5.9 (H) 10/07/2020   No results found for: LABOPIA, COCAINSCRNUR, LABBENZ, AMPHETMU, THCU, LABBARB  No results for input(s): ETH in the last 168 hours.  I have personally reviewed the radiological images below and agree with the radiology interpretations.  CT Angio Head W or Wo Contrast  Result Date: 10/06/2020 CLINICAL DATA:  Stroke. TIA. Assess intracranial arteries. Unsteady gait. Left-sided weakness. Left-sided visual loss. Headache. EXAM: CT ANGIOGRAPHY HEAD AND NECK TECHNIQUE: Multidetector CT imaging of the head and neck was performed using the standard protocol during bolus administration of intravenous contrast. Multiplanar CT image reconstructions and MIPs were obtained to evaluate the vascular anatomy. Carotid stenosis measurements (when applicable) are obtained utilizing NASCET criteria, using the distal internal carotid diameter as the denominator. CONTRAST:  14m OMNIPAQUE IOHEXOL 350 MG/ML SOLN COMPARISON:  Head CT earlier same day FINDINGS: CTA NECK FINDINGS Aortic arch: Aortic atherosclerosis.  Branching pattern is normal. Right carotid system: Common carotid artery is tortuous but widely patent to the bifurcation. Carotid bifurcation is widely patent.  Minimal calcified plaque at the distal ICA bulb but no stenosis. Cervical ICA widely patent. Left carotid system: Common carotid artery is tortuous but widely patent to the bifurcation. Carotid bifurcation is normal. Minimal calcified plaque at the ICA bulb but no stenosis. Cervical ICA widely patent beyond that. Vertebral arteries: Right vertebral artery origin is widely patent. Left vertebral artery origin is poorly seen because of regional venous reflux. Both vertebral arteries do appear patent through the cervical region to the foramen magnum. Skeleton: Ordinary cervical spondylosis. Other neck: No mass or lymphadenopathy. Upper chest: Upper lungs are clear. Review of the MIP images confirms the above findings CTA HEAD FINDINGS Anterior circulation: Both internal carotid arteries are patent through the skull base and siphon regions. There is ordinary siphon atherosclerotic calcification. On the left, there is no stenosis. On the right, there is 50-70% stenosis in the siphon. Supraclinoid internal carotid arteries are widely patent. The anterior and middle cerebral vessels are patent. No large vessel occlusion, with particular attention to the right MCA. Posterior circulation: Both vertebral arteries are patent through the foramen magnum to the basilar. No basilar stenosis. Posterior circulation branch vessels are normal. Venous sinuses: Patent and  normal. Anatomic variants: None significant. Review of the MIP images confirms the above findings IMPRESSION: No intracranial large vessel occlusion identified at this time, with specific attention to the right MCA branches. Minimal atherosclerotic change at both internal carotid artery bulbs. No stenosis. 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 No stenosis on the left. 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 Electronically Signed   By: Nelson Chimes M.D.   On: 10/06/2020 15:01   CT HEAD WO CONTRAST (5MM)  Result Date: 10/14/2020 CLINICAL  DATA:  Right-sided headache, recent stroke EXAM: CT HEAD WITHOUT CONTRAST TECHNIQUE: Contiguous axial images were obtained from the base of the skull through the vertex without intravenous contrast. COMPARISON:  10/06/2020 CT head, 10/07/2020 MRI head FINDINGS: Brain: Area of hypodensity in the right parietal and occipital lobes, which correlates with the area of infarct seen on the 10/06/2020 CT and 10/07/2020 MRI, with edema in this area. Subtle areas of hyperdensity, likely petechial hemorrhage. No evidence of hemorrhagic transformation. No new areas of infarction. No significant midline shift. No mass or extra-axial collection. Vascular: No hyperdense vessel or unexpected calcification. Skull: Normal. Negative for fracture or focal lesion. Sinuses/Orbits: Mucosal thickening in the left frontal sinus and bilateral ethmoid air cells. The orbits are unremarkable. Other: None. IMPRESSION: Expected evolution of the previously noted right MCA/PCA territory infarct, with petechial hemorrhage and mild edema, without midline shift or hemorrhagic transformation. Electronically Signed   By: Merilyn Baba MD   On: 10/14/2020 10:02   CT HEAD WO CONTRAST (5MM)  Result Date: 10/06/2020 CLINICAL DATA:  Neuro deficit, acute, stroke suspected. Steady gait, left-sided weakness and left-sided vision loss, increased blood pressure, headache for 3 days, history of hypertension. EXAM: CT HEAD WITHOUT CONTRAST TECHNIQUE: Contiguous axial images were obtained from the base of the skull through the vertex without intravenous contrast. COMPARISON:  No pertinent prior exams available for comparison. FINDINGS: Brain: Cerebral volume is normal for age. Region of abnormal cortical/subcortical hypodensity measuring 6.3 x 3.3 x 5.8 cm within the right parietooccipital lobes compatible with acute/early subacute infarction (right PCA vascular territory and right MCA/PCA watershed territory). No significant mass effect at this time. No evidence  of hemorrhagic conversion. Chronic appearing lacunar infarct within the left lentiform nucleus. No extra-axial fluid collection. No evidence of an intracranial mass. No midline shift. Vascular: No hyperdense vessel.  Atherosclerotic calcifications. Skull: Normal. Negative for fracture or focal lesion. Sinuses/Orbits: Visualized orbits show no acute finding. Mild mucosal thickening within the frontal sinuses bilaterally. Mild-to-moderate mucosal thickening within the bilateral ethmoid air cells. These results were discussed by telephone at the time of interpretation on 10/06/2020 at 1:55 pm with provider MATTHEW TRIFAN , who verbally acknowledged these results. IMPRESSION: 6.3 x 3.3 x 5.8 cm acute/early subacute cortical and subcortical infarct within the right parietooccipital lobes (right PCA vascular territory and right MCA/PCA watershed territory). Consider MR or CT angiography for further evaluation. Chronic left basal ganglia lacunar infarct. Paranasal sinus disease at the imaged levels, as described. Electronically Signed   By: Kellie Simmering DO   On: 10/06/2020 14:13   CT Angio Neck W and/or Wo Contrast  Result Date: 10/06/2020 CLINICAL DATA:  Stroke. TIA. Assess intracranial arteries. Unsteady gait. Left-sided weakness. Left-sided visual loss. Headache. EXAM: CT ANGIOGRAPHY HEAD AND NECK TECHNIQUE: Multidetector CT imaging of the head and neck was performed using the standard protocol during bolus administration of intravenous contrast. Multiplanar CT image reconstructions and MIPs were obtained to evaluate the vascular anatomy. Carotid stenosis measurements (  when applicable) are obtained utilizing NASCET criteria, using the distal internal carotid diameter as the denominator. CONTRAST:  155m OMNIPAQUE IOHEXOL 350 MG/ML SOLN COMPARISON:  Head CT earlier same day FINDINGS: CTA NECK FINDINGS Aortic arch: Aortic atherosclerosis.  Branching pattern is normal. Right carotid system: Common carotid artery is  tortuous but widely patent to the bifurcation. Carotid bifurcation is widely patent. Minimal calcified plaque at the distal ICA bulb but no stenosis. Cervical ICA widely patent. Left carotid system: Common carotid artery is tortuous but widely patent to the bifurcation. Carotid bifurcation is normal. Minimal calcified plaque at the ICA bulb but no stenosis. Cervical ICA widely patent beyond that. Vertebral arteries: Right vertebral artery origin is widely patent. Left vertebral artery origin is poorly seen because of regional venous reflux. Both vertebral arteries do appear patent through the cervical region to the foramen magnum. Skeleton: Ordinary cervical spondylosis. Other neck: No mass or lymphadenopathy. Upper chest: Upper lungs are clear. Review of the MIP images confirms the above findings CTA HEAD FINDINGS Anterior circulation: Both internal carotid arteries are patent through the skull base and siphon regions. There is ordinary siphon atherosclerotic calcification. On the left, there is no stenosis. On the right, there is 50-70% stenosis in the siphon. Supraclinoid internal carotid arteries are widely patent. The anterior and middle cerebral vessels are patent. No large vessel occlusion, with particular attention to the right MCA. Posterior circulation: Both vertebral arteries are patent through the foramen magnum to the basilar. No basilar stenosis. Posterior circulation branch vessels are normal. Venous sinuses: Patent and normal. Anatomic variants: None significant. Review of the MIP images confirms the above findings IMPRESSION: No intracranial large vessel occlusion identified at this time, with specific attention to the right MCA branches. Minimal atherosclerotic change at both internal carotid artery bulbs. No stenosis. 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 No stenosis on the left. 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 Electronically Signed   By: MNelson ChimesM.D.   On:  10/06/2020 15:01   MR BRAIN WO CONTRAST  Result Date: 10/14/2020 CLINICAL DATA:  Stroke, follow up.  Eval for progression of stroke. EXAM: MRI HEAD WITHOUT CONTRAST TECHNIQUE: Multiplanar, multiecho pulse sequences of the brain and surrounding structures were obtained without intravenous contrast. COMPARISON:  Head CT 10/14/2020 and MRI 10/07/2020 FINDINGS: The study is intermittently motion degraded including severe motion on the coronal T2 sequence. Brain: The moderately large infarct involving the posterior right MCA territory and PCA border zone has mildly expanded compared to the prior MRI, most notable anteriorly and superiorly. Associated petechial hemorrhage is partially confluent and has increased from the prior MRI. There is progressive cytotoxic edema with regional sulcal effacement and mass effect on the right lateral ventricle. New mildly restricted diffusion in the splenium of the corpus callosum may reflect acute wallerian degeneration. Small cortical and subcortical infarcts in the right frontal lobe (border zone distribution) have also mildly increased from the prior MRI. Small T2 hyperintensities elsewhere in the cerebral white matter bilaterally are similar to the prior MRI and are nonspecific but compatible with mild chronic small vessel ischemic disease. There is no midline shift or extra-axial fluid collection. A chronic hemorrhagic infarct is again noted in the posterior aspect of the left lentiform nucleus/external capsule. Vascular: Major intracranial vascular flow voids are preserved. Skull and upper cervical spine: Unremarkable bone marrow signal para Sinuses/Orbits: Unremarkable orbits. Mild mucosal thickening in the paranasal sinuses. Clear mastoid air cells. Other: None. IMPRESSION: 1. Mild enlargement of the  right MCA/border zone infarct since the 10/07/2020 MRI. Increased cytotoxic edema and petechial hemorrhage without midline shift. 2. Chronic small-vessel ischemia including an  old hemorrhagic infarct in the left basal ganglia. Electronically Signed   By: Logan Bores M.D.   On: 10/14/2020 18:22   MR BRAIN WO CONTRAST  Result Date: 10/07/2020 CLINICAL DATA:  Neuro deficit, acute, stroke suspected. Additional history provided: Vision change and mild left-sided weakness which began 2 days ago. EXAM: MRI HEAD WITHOUT CONTRAST TECHNIQUE: Multiplanar, multiecho pulse sequences of the brain and surrounding structures were obtained without intravenous contrast. COMPARISON:  Noncontrast head CT 10/06/2020. CT angiogram head/neck 10/06/2020. FINDINGS: Brain: Cerebral volume is normal. Region of abnormal cortical/subcortical restricted diffusion within the right parietal and occipital lobes measuring 6.2 x 3.3 x 6.4 cm compatible with acute/early subacute infarction (at the junction of the right MCA and PCA vascular territories). This has not significantly changed in extent as compared to the head CT performed yesterday 10/06/2020. Mild T1 hyperintensity and SWI signal loss within the infarction territory compatible with petechial hemorrhage and possibly cortical laminar necrosis. No significant mass effect at this time. There are numerous additional small patchy cortical and subcortical acute/early subacute infarcts within the right frontal and parietal lobes (watershed distribution). Background mild multifocal T2/FLAIR hyperintensity within the cerebral white matter, nonspecific but compatible with chronic small vessel ischemic disease. Chronic lacunar infarct with associated chronic hemosiderin deposition within the left basal ganglia/posterior limb of left internal capsule. No evidence of an intracranial mass. No extra-axial fluid collection. No midline shift. Vascular: Maintained proximal arterial flow voids. Skull and upper cervical spine: No focal suspicious marrow lesion. Incompletely assessed cervical spondylosis. Sinuses/Orbits: Visualized orbits show no acute finding. 11 mm left  maxillary sinus mucous retention cyst. Moderate bilateral ethmoid sinus mucosal thickening. Trace mucosal thickening within the left frontal sinus. IMPRESSION: 6.2 x 6.4 cm acute/early subacute cortical and subcortical infarction within the right parietal and occipital lobes (at the junction of the right MCA and PCA territories). The infarct has not significantly changed in extent as compared to the head CT of 10/06/2020. Mild petechial hemorrhage within the infarction territory. No significant mass effect at this time. Numerous additional small patchy cortical and subcortical acute/early subacute infarcts within the right frontal and parietal lobes (watershed distribution). 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 Background mild chronic small vessel ischemic changes within the cerebral white matter. Paranasal sinus disease at the imaged levels, as described. Electronically Signed   By: Kellie Simmering DO   On: 10/07/2020 07:57   ECHOCARDIOGRAM COMPLETE  Result Date: 10/07/2020    ECHOCARDIOGRAM REPORT   Patient Name:   RAWLINS BOOKWALTER Date of Exam: 10/07/2020 Medical Rec #:  [id number]    Height:       65.0 in Accession #:    [id number]   Weight: Date of Birth:  1951/01/09     BSA: Patient Age:    28 years     BP:           115/58 mmHg Patient Gender: M            HR:           66 bpm. Exam Location:  Inpatient Procedure: 2D Echo, Cardiac Doppler and Color Doppler Indications:    Stroke I63.9  History:        Patient has no prior history of Echocardiogram examinations.  Risk Factors:Hypertension and Dyslipidemia. PVD. PE.  Sonographer:    Vickie Epley RDCS Referring Phys: [place]  1. Left ventricular ejection fraction, by estimation, is 65 to 70%. The left ventricle has hyperdynamic function. The left ventricle has no regional wall motion abnormalities. Left ventricular diastolic parameters are consistent with Grade I  diastolic dysfunction (impaired relaxation).  2. Right ventricular systolic function is normal. The right ventricular size is normal. Tricuspid regurgitation signal is inadequate for assessing PA pressure.  3. The mitral valve is normal in structure. No evidence of mitral valve regurgitation. No evidence of mitral stenosis.  4. The aortic valve is tricuspid. Aortic valve regurgitation is not visualized. No aortic stenosis is present.  5. The inferior vena cava is normal in size with greater than 50% respiratory variability, suggesting right atrial pressure of 3 mmHg. FINDINGS  Left Ventricle: Left ventricular ejection fraction, by estimation, is 65 to 70%. The left ventricle has hyperdynamic function. The left ventricle has no regional wall motion abnormalities. The left ventricular internal cavity size was normal in size. There is no left ventricular hypertrophy. Left ventricular diastolic parameters are consistent with Grade I diastolic dysfunction (impaired relaxation). Right Ventricle: The right ventricular size is normal. No increase in right ventricular wall thickness. Right ventricular systolic function is normal. Tricuspid regurgitation signal is inadequate for assessing PA pressure. Left Atrium: Left atrial size was normal in size. Right Atrium: Right atrial size was normal in size. Pericardium: There is no evidence of pericardial effusion. Mitral Valve: The mitral valve is normal in structure. No evidence of mitral valve regurgitation. No evidence of mitral valve stenosis. Tricuspid Valve: The tricuspid valve is normal in structure. Tricuspid valve regurgitation is not demonstrated. Aortic Valve: The aortic valve is tricuspid. Aortic valve regurgitation is not visualized. No aortic stenosis is present. Pulmonic Valve: The pulmonic valve was normal in structure. Pulmonic valve regurgitation is not visualized. Aorta: The aortic root is normal in size and structure. Venous: The inferior vena cava is normal in  size with greater than 50% respiratory variability, suggesting right atrial pressure of 3 mmHg. IAS/Shunts: No atrial level shunt detected by color flow Doppler.  LEFT VENTRICLE PLAX 2D LVIDd:         3.60 cm      Diastology LVIDs:         2.50 cm      LV e' medial:    6.00 cm/s LV PW:         1.00 cm      LV E/e' medial:  7.4 LV IVS:        1.10 cm      LV e' lateral:   7.62 cm/s LVOT diam:     2.20 cm      LV E/e' lateral: 5.8 LV SV:         97 LVOT Area:     3.80 cm  LV Volumes (MOD) LV vol d, MOD A2C: 98.1 ml LV vol d, MOD A4C: 108.0 ml LV vol s, MOD A2C: 39.6 ml LV vol s, MOD A4C: 40.2 ml LV SV MOD A2C:     58.5 ml LV SV MOD A4C:     108.0 ml LV SV MOD BP:      64.0 ml RIGHT VENTRICLE RV S prime:     14.80 cm/s TAPSE (M-mode): 2.3 cm LEFT ATRIUM           RIGHT ATRIUM LA diam:      3.20 cm  RA Area:     14.70 cm LA Vol (A2C): 31.0 ml RA Volume:   36.60 ml LA Vol (A4C): 24.7 ml  AORTIC VALVE LVOT Vmax:   129.00 cm/s LVOT Vmean:  96.600 cm/s LVOT VTI:    0.254 m  AORTA Ao Root diam: 3.50 cm Ao Asc diam:  3.50 cm MITRAL VALVE MV Area (PHT): 2.41 cm    SHUNTS MV Decel Time: 315 msec    Systemic VTI:  0.25 m MV E velocity: 44.50 cm/s  Systemic Diam: 2.20 cm MV A velocity: 65.60 cm/s MV E/A ratio:  0.68 Loralie Champagne MD Electronically signed by Loralie Champagne MD Signature Date/Time: 10/07/2020/5:59:38 PM    Final     TTE  IMPRESSIONS:  1. Left ventricular ejection fraction, by estimation, is 65 to 70%. The  left ventricle has hyperdynamic function. The left ventricle has no  regional wall motion abnormalities. Left ventricular diastolic parameters  are consistent with Grade I diastolic  dysfunction (impaired relaxation).   2. Right ventricular systolic function is normal. The right ventricular  size is normal. Tricuspid regurgitation signal is inadequate for assessing  PA pressure.   3. The mitral valve is normal in structure. No evidence of mitral valve  regurgitation. No evidence of mitral stenosis.    4. The aortic valve is tricuspid. Aortic valve regurgitation is not  visualized. No aortic stenosis is present.   5. The inferior vena cava is normal in size with greater than 50%  respiratory variability, suggesting right atrial pressure of 3 mmHg.   EEG: IMPRESSION: This study is suggestive of cortical dysfunction in right hemisphere, likely secondary to underlying stroke.  No seizures or epileptiform discharges were seen throughout the recording.    PHYSICAL EXAM  Temp:  [97.5 F (36.4 C)-98.2 F (36.8 C)] 98.2 F (36.8 C) (08/12 0725) Pulse Rate:  [40-68] 48 (08/12 0725) Resp:  [10-21] 14 (08/12 0725) BP: (106-198)/(53-130) 130/62 (08/12 0725) SpO2:  [97 %-100 %] 100 % (08/12 0725)  General - Well nourished, well developed, in no apparent distress.  Ophthalmologic - fundi not visualized due to noncooperation.  Cardiovascular - Regular rhythm and rate.  Mental Status -  Level of arousal and orientation to time, place, and person were intact. Language including expression, naming, repetition, comprehension was assessed and found intact. Attention span and concentration were normal. Recent and remote memory were intact. Fund of Knowledge was assessed and was intact.  Cranial Nerves II - XII - II - Visual field intact OU. III, IV, VI - Extraocular movements intact. V - Facial sensation intact bilaterally. VII - Facial movement intact bilaterally. VIII - Hearing & vestibular intact bilaterally. X - Palate elevates symmetrically. XI - Chin turning & shoulder shrug intact bilaterally. XII - Tongue protrusion intact.  Motor Strength - The patient's strength was normal 5/5 right hemibody.  Decreased strength at the level of left elbow (4/), left wrist 3/5, left hand/grip strength 2/5 (patient attempts to grip but not very strong)  Reflexes - The patient's reflexes were symmetrical in all extremities and he had no pathological reflexes.  Sensory - Light touch,  temperature/pinprick were assessed and were symmetrical.  On exam declined numbness of LUE today.  Coordination - Tremor/rhythmic twitching of L hand, nonsuppressible.  Gait and Station - deferred.   ASSESSMENT/PLAN Mr. EVIE WILTSIE is a 70 y.o. 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 no tPA given due to risk of bleeding  Increased petechial hemorrhage/evolution of previous right MCA/PCA territory infarct with mild edema without midline shift or hemorrhagic transformation MRI mild enlargement with cytotoxic edema and petechial hemorrhage without midline shift compared to 8/4 2D Echo grade 1 diastolic dysfunction, EF 65 to 70% LDL 75 HgbA1c 5.9% SCDs for VTE prophylaxis Pradaxa (dabigatran) twice a day prior to admission, now on No antithrombotic.  Therapy recommendations:  pending Disposition:  pending  LUE non suppressible twitching/weakness, concern for focal seizure activity EEG neg for seizures, cortical dysfunction of R hemisphere likely secondary to recent infarct.  May benefit from being on LTM Continue keppra  Hypertension Stable SBP goal 130-150 Long term BP goal normotensive  Hyperlipidemia Home meds:  lipitor 80 mg  LDL 75, goal < 70 Now on lipitor 40 mg Continue statin at discharge  Other Stroke Risk Factors Advanced age Cigarette smoker, advised to stop smoking Obesity, Body mass index is 35.11 kg/m.  Hx stroke/TIA  Hospital day # 1   Posey Pronto PA-C [facility] [phone]  Discussed with attending neurologist review of note to follow from Dr. Erlinda Hong.    To contact  Stroke Continuity provider, please refer to [email or web address] After hours, contact General Neurology

## 2020-10-15 NOTE — Progress Notes (Signed)
LTM maint complete - no skin breakdown under: Fp1 Fp2 F4  Maintenance Fp1 Fp2 T8 A2  [facility] monitored, Event button test confirmed by [facility].

## 2020-10-15 NOTE — Evaluation (Signed)
Occupational Therapy Evaluation Patient Details Name: Clifford [name] MRN: [id number] DOB: 10-14-1950 Today's Date: 10/15/2020    History of Present Illness 70 y/o male presented to ED on 8/11 for R sided headache and worsening L UE weakness. Patient recently admitted 8/3-8/5 for R CVA with L visual deficits and L sided weakness. MRI with mild enlargement of R MCA/border zone infarct since 10/07/20 MRI and increased cytotoxic edema and petechial hemorrhage without midline shift. PMH: HTN, HLD, hx of PE on coumadin, CVA 10/2020   Clinical Impression   PTA, pt was living with his brother and sister-in-law and was performing BADLs and using cane for mobility. Upon arrival, pt sitting at EOB and eating lunch. Pt performing grooming at sink with Min Guard A for safety and then Max cues for locating items in L visual field. Requiring Mod A for incorporating LUE into bilateral tasks (I.e. L hand holding tooth paste and then R twisting cap off). Pt then requiring to use the restroom and performing toilet transfer with Min A. Once finishing toileting, pt requiring Max A to power up into standing and then presenting with heavy L lean. Pt unaware of change in balance and unable to correct. Pt requiring Max A +2 to walk back to EOB and then pt with heavy L lean sitting at EOB. Return to supine with Max A +2; BP stable 138/67 (87). Also, noting R gaze more significant, heavy left lateral, and twitching in LUE stopping. Notified RN and she was present upon OT leaving. Pt will require further acute OT to facilitate safe dc. Recommend dc to CIR for further OT to optimize safety, independence with ADLs, and return to PLOF.     Follow Up Recommendations  CIR    Equipment Recommendations  None recommended by OT    Recommendations for Other Services PT consult;Rehab consult     Precautions / Restrictions Precautions Precautions: Fall Precaution Comments: L visual field deficit, L inattention Restrictions Weight  Bearing Restrictions: No      Mobility Bed Mobility Overal bed mobility: Needs Assistance Bed Mobility: Supine to Sit;Sit to Supine     Supine to sit: Supervision Sit to supine: Supervision   General bed mobility comments: Sitting at EOB upon arrival. after fucntional chaneg in bathroom, pt unable to complete bed mobility without Max A +2    Transfers Overall transfer level: Needs assistance Equipment used: None Transfers: Sit to/from Stand Sit to Stand: Min guard;Mod assist         General transfer comment: Min Guard A for safety. Mod A for power up from toilet    Balance Overall balance assessment: Mild deficits observed, not formally tested                                         ADL either performed or assessed with clinical judgement   ADL Overall ADL's : Needs assistance/impaired Eating/Feeding: Minimal assistance;Sitting Eating/Feeding Details (indicate cue type and reason): Requiring Assistance to open containers due to poor bilateral coorindation Grooming: Moderate assistance;Standing;Oral care Grooming Details (indicate cue type and reason): Mod A for bilateral coorindation Upper Body Bathing: Moderate assistance;Sitting   Lower Body Bathing: Sit to/from stand;Cueing for compensatory techniques;Cueing for sequencing;Cueing for safety;Minimal assistance   Upper Body Dressing : Moderate assistance;Sitting   Lower Body Dressing: Sit to/from stand;Minimal assistance;Cueing for sequencing;Cueing for compensatory techniques;Cueing for back precautions   Toilet Transfer: Minimal assistance;Ambulation  Functional mobility during ADLs: Minimal assistance;Cueing for safety;Cueing for sequencing General ADL Comments: Pt presenting with decreased funtional use of LUE, decreased vision on left, and poor cognition. Performing at [facility] guard-Min A level for balance and requiring increased cues for L inattention. Upon finishing in bathroom, pt  with functional change and requiring Max A +2 for return to bed. Noting R gaze more significant, heavy L lateral lean, and twitching in RUE stopped.     Vision Baseline Vision/History: Wears glasses Wears Glasses: At all times Patient Visual Report: Peripheral vision impairment (L sided peripheral vision gone since last week) Vision Assessment?: Yes Visual Fields: Left homonymous hemianopsia Additional Comments: Pt unable to locate items on L including tooth brush and toothpaste as well as toilet. Noting R gaze more signficant at end of session after change in functional in bathroom     Perception     Praxis      Pertinent Vitals/Pain Pain Assessment: No/denies pain Faces Pain Scale: Hurts little more Pain Location: head Pain Descriptors / Indicators: Headache Pain Intervention(s): Monitored during session     Hand Dominance Right   Extremity/Trunk Assessment Upper Extremity Assessment Upper Extremity Assessment: LUE deficits/detail LUE Deficits / Details: Twitching of index finger and thumb into flexed positioning; rhythmic and consistent. Unable to perform composite extension or flexion. Performing AROM at elbow and shoulder. LUE Coordination: decreased fine motor;decreased gross motor   Lower Extremity Assessment Lower Extremity Assessment: Defer to PT evaluation LLE Deficits / Details: grossly 4+/5. Difficulty performing heel to shin on L and demos dysdiadochokinesis LLE Sensation: decreased light touch LLE Coordination: decreased fine motor;decreased gross motor   Cervical / Trunk Assessment Cervical / Trunk Assessment: Kyphotic   Communication Communication Communication: No difficulties   Cognition Arousal/Alertness: Awake/alert Behavior During Therapy: WFL for tasks assessed/performed Overall Cognitive Status: Impaired/Different from baseline Area of Impairment: Problem solving;Awareness                           Awareness: Emergent Problem  Solving: Slow processing;Requires verbal cues General Comments: Patient aware of deficits but difficulty compensating for visual deficit. Slow processing during scavenger hunt when provided verbal cues to problem solve finding room numbers   General Comments  BP at end of session 138/67 (87)    Exercises     Shoulder Instructions      Home Living Family/patient expects to be discharged to:: Private residence Living Arrangements: Other relatives (Brother and sister-in-law) Available Help at Discharge: Family;Available 24 hours/day Type of Home: House Home Access: Level entry     Home Layout: One level     Bathroom Shower/Tub: Teacher, early years/pre: Standard     Home Equipment: Cane - single point   Additional Comments: Retired Biomedical scientist      Prior Functioning/Environment Level of Independence: Independent with assistive device(s)        Comments: uses SPC due to PAD, no assist with ADL and IADL, not driving since last admission 8/3-8/5 for CVA        OT Problem List: Impaired vision/perception;Impaired balance (sitting and/or standing)      OT Treatment/Interventions: Self-care/ADL training;Visual/perceptual remediation/compensation;Therapeutic activities    OT Goals(Current goals can be found in the care plan section) Acute Rehab OT Goals Patient Stated Goal: to get my vision normal OT Goal Formulation: With patient Time For Goal Achievement: 10/29/20 Potential to Achieve Goals: Good  OT Frequency: Min 2X/week   Barriers to D/C:  Co-evaluation              AM-PAC OT "6 Clicks" Daily Activity     Outcome Measure Help from another person eating meals?: A Little Help from another person taking care of personal grooming?: A Little Help from another person toileting, which includes using toliet, bedpan, or urinal?: A Little Help from another person bathing (including washing, rinsing, drying)?: A Little Help from another person to put  on and taking off regular upper body clothing?: A Little Help from another person to put on and taking off regular lower body clothing?: A Little 6 Click Score: 18   End of Session Nurse Communication: Mobility status  Activity Tolerance: Patient tolerated treatment well Patient left: in bed;with call [place]/phone within reach;with nursing/sitter in room  OT Visit Diagnosis: Unsteadiness on feet (R26.81)                Time: [id number] OT Time Calculation (min): 45 min Charges:  OT General Charges $OT Visit: 1 Visit OT Evaluation $OT Eval Moderate Complexity: 1 Mod OT Treatments $Self Care/Home Management : 23-37 mins  [place], OTR/L Acute Rehab Pager: [phone] Office: [place] 10/15/2020, 11:45 AM

## 2020-10-15 NOTE — Procedures (Signed)
Patient Name: Clifford [name]  MRN: [id number]  Epilepsy Attending: Lora Havens  Referring Physician/Provider: Dr Lesleigh Noe Date: 10/15/2020 Duration: 23.02 mins  Patient history: 70 year old male with posterior right parietal watershed infarct who presented with worsening stroke symptoms as well as left thumb rhythmic and nonsuppressible shaking movements.  EEG to evaluate for seizures.  Level of alertness: Awake, asleep  AEDs during EEG study: LEV  Technical aspects: This EEG study was done with scalp electrodes positioned according to the 10-20 International system of electrode placement. Electrical activity was acquired at a sampling rate of '500Hz'$  and reviewed with a high frequency filter of '70Hz'$  and a low frequency filter of '1Hz'$ . EEG data were recorded continuously and digitally stored.   Description: The posterior dominant rhythm consists of 8-9 Hz activity of moderate voltage (25-35 uV) seen predominantly in posterior head regions, asymmetric ( R<L) and reactive to eye opening and eye closing. Sleep was characterized by vertex waves, sleep spindles (12 to 14 Hz), maximal frontocentral region.  EEG showed continuous rhythmic 3 to 5 Hz theta-delta slowing in right hemisphere. Hyperventilation and photic stimulation were not performed.     ABNORMALITY - Continuous slow, right hemisphere - Background asymmetry, right<left  IMPRESSION: This study is suggestive of cortical dysfunction in right hemisphere, likely secondary to underlying stroke.  No seizures or epileptiform discharges were seen throughout the recording.  Clifford [name]

## 2020-10-15 NOTE — Progress Notes (Signed)
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  MRI significant for evolving and increased size of the stroke with petechial hemorrhage and cytotoxic edema, as well there is concern of new seizures as well, he was admitted for further work-up.  Assessment & Plan:   Principal Problem:   Left-sided weakness Active Problems:   Stroke [facility])   Essential hypertension   Mixed hyperlipidemia   Prediabetes   History of pulmonary embolism   Chronic anticoagulation   Nicotine dependence, cigarettes, uncomplicated   Petechial hemorrhage   Acute stroke due to ischemia [facility])  Left-sided weakness secondary to worsening right parietal-occipital MCA/PCA watershed stroke complicated by worsening petechial hemorrhage  -Patient exhibiting what seems to be worsening left hand weakness and left lateral visual field deficit likely secondary to extension of already known right parietoccipital MCA/PCA stroke. -Imaging showing evidence of increasing cytotoxic edema and increased petechial hemorrhage. -Neurology input greatly appreciated, will hold Pradaxa for now and await further recommendations. -There is a concern for seizures, acted on Keppra, EEG significant for cortical dysfunction and right hemiparesis, likely related to underlying stroke, but no seizures or epileptiform discharges. -Consult PT/OT    Essential hypertension - Target systolic blood pressure  between [id number] and 0000000 systolic considering increasing petechial hemorrhage with as eeded intravenous antihypertensives -Home medications remain on hold as overall blood pressure is controlled.   Mixed hyperlipidemia - Continue atorvastatin 40 mg daily   History of pulmonary embolism with chronic anticoagulation - Patient was transitioned from Coumadin to Pradaxa during the last hospitalization in an effort to ensure patient also received consistent therapeutic levels of anticoagulant - Temporarily holding anticoagulation due to increasing petechial hemorrhage   Nicotine dependence, cigarettes, uncomplicated - Counseled patient on cessation daily   Prediabetes - Last hemoglobin A1c 5.9 percent noted during last hospitalization          DVT prophylaxis: pradaxa on hold Code Status: Full Family Communication: None at bedside Disposition:   Status is: Inpatient  Remains inpatient appropriate because:Ongoing diagnostic testing needed not appropriate for outpatient work up  Dispo: The patient is from: Home              Anticipated d/c is to: Home              Patient currently is not medically stable to d/c.   Difficult to place patient No       Consultants:  neurolgoy  Subjective:  Denies any complaints today, denies any headache, chest pain or shortness of breath  Objective: Vitals:   10/15/20 0522 10/15/20 0725 10/15/20 1129 10/15/20 1152  BP:  130/62 (!) 162/70 138/67  Pulse: 61 (!) 48 60   Resp:  14 14   Temp:  98.2 F (36.8 C) 98.1 F (36.7 C)   TempSrc:  Oral Oral   SpO2:  100% 100%   Weight:      Height:       No intake or output data in the 24 hours ending 10/15/20 1154  Filed Weights   10/14/20 0840  Weight: 95.7 kg    Examination:  Awake Alert, Oriented X 3, pleasant, with left-sided weakness Symmetrical Chest wall movement, Good air movement bilaterally, CTAB RRR,No Gallops,Rubs or new Murmurs, No Parasternal Heave +ve B.Sounds, Abd Soft, No  tenderness, No rebound - guarding or rigidity. No Cyanosis, Clubbing or edema, No new Rash or bruise      Data Reviewed: I have personally reviewed following labs and imaging studies  CBC: Recent Labs  Lab 10/14/20 0913 10/14/20 0920 10/15/20 0500  WBC  --  4.3 4.3  NEUTROABS  --  2.6 1.8  HGB 14.3 13.5 11.8*  HCT 42.0 42.1 37.9*  MCV  --  81.6 83.8  PLT  --  227 0000000    Basic Metabolic Panel: Recent Labs  Lab 10/14/20 0913 10/14/20 0920 10/15/20 0500  NA 140 137 135  K 3.6 3.7 3.6  CL 104 104 102  CO2  --  22 23  GLUCOSE 118* 117* 86  BUN '19 17 18  '$ CREATININE 1.20 1.30* 1.22  CALCIUM  --  9.2 8.9  MG  --   --  1.8    GFR: Estimated Creatinine Clearance: 59.1 mL/min (by C-G formula based on SCr of 1.22 mg/dL).  Liver Function Tests: Recent Labs  Lab 10/14/20 0920 10/15/20 0500  AST 22 19  ALT 13 12  ALKPHOS 77 65  BILITOT 1.0 0.8  PROT 7.1 6.2*  ALBUMIN 3.6 3.0*    CBG: No results for input(s): GLUCAP in the last 168 hours.   Recent Results (from the past 240 hour(s))  Resp Panel by RT-PCR (Flu A&B, Covid) Nasopharyngeal Swab     Status: None   Collection Time: 10/06/20  2:18 PM   Specimen: Nasopharyngeal Swab; Nasopharyngeal(NP) swabs in vial transport medium  Result Value Ref Range Status   SARS Coronavirus 2 by RT PCR NEGATIVE NEGATIVE Final    Comment: (NOTE) SARS-CoV-2 target nucleic acids are NOT DETECTED.  The SARS-CoV-2 RNA is generally detectable in upper respiratory specimens during the acute phase of infection. The lowest concentration of SARS-CoV-2 viral copies this assay can detect is 138 copies/mL. A negative result does not preclude SARS-Cov-2 infection and should not be used as the sole basis for treatment or other patient management decisions. A negative result may occur with  improper specimen collection/handling, submission of specimen other than nasopharyngeal swab, presence of viral mutation(s) within the areas targeted by  this assay, and inadequate number of viral copies(<138 copies/mL). A negative result must be combined with clinical observations, patient history, and epidemiological information. The expected result is Negative.  Fact Sheet for Patients:  EntrepreneurPulse.com.au  Fact Sheet for Healthcare Providers:  IncredibleEmployment.be  This test is no t yet approved or cleared by the [place] FDA and  has been authorized for detection and/or diagnosis of SARS-CoV-2 by FDA under an Emergency Use Authorization (EUA). This EUA will remain  in effect (meaning this test can be used) for the duration of the COVID-19 declaration under Section 564(b)(1) of the Act, 21 U.S.C.section 360bbb-3(b)(1), unless the authorization is terminated  or revoked sooner.       Influenza A by PCR NEGATIVE NEGATIVE Final   Influenza B by PCR NEGATIVE NEGATIVE Final    Comment: (NOTE) The Xpert Xpress SARS-CoV-2/FLU/RSV plus assay is intended as an aid in the diagnosis of influenza from Nasopharyngeal swab specimens and should not be used as a sole basis for treatment. Nasal washings and aspirates are  unacceptable for Xpert Xpress SARS-CoV-2/FLU/RSV testing.  Fact Sheet for Patients: EntrepreneurPulse.com.au  Fact Sheet for Healthcare Providers: IncredibleEmployment.be  This test is not yet approved or cleared by the [place] FDA and has been authorized for detection and/or diagnosis of SARS-CoV-2 by FDA under an Emergency Use Authorization (EUA). This EUA will remain in effect (meaning this test can be used) for the duration of the COVID-19 declaration under Section 564(b)(1) of the Act, 21 U.S.C. section 360bbb-3(b)(1), unless the authorization is terminated or revoked.  Performed at [facility], [address] [place], [place] [address]   Resp Panel by RT-PCR (Flu A&B, Covid) Nasopharyngeal Swab     Status:  None   Collection Time: 10/14/20  7:06 PM   Specimen: Nasopharyngeal Swab; Nasopharyngeal(NP) swabs in vial transport medium  Result Value Ref Range Status   SARS Coronavirus 2 by RT PCR NEGATIVE NEGATIVE Final    Comment: (NOTE) SARS-CoV-2 target nucleic acids are NOT DETECTED.  The SARS-CoV-2 RNA is generally detectable in upper respiratory specimens during the acute phase of infection. The lowest concentration of SARS-CoV-2 viral copies this assay can detect is 138 copies/mL. A negative result does not preclude SARS-Cov-2 infection and should not be used as the sole basis for treatment or other patient management decisions. A negative result may occur with  improper specimen collection/handling, submission of specimen other than nasopharyngeal swab, presence of viral mutation(s) within the areas targeted by this assay, and inadequate number of viral copies(<138 copies/mL). A negative result must be combined with clinical observations, patient history, and epidemiological information. The expected result is Negative.  Fact Sheet for Patients:  EntrepreneurPulse.com.au  Fact Sheet for Healthcare Providers:  IncredibleEmployment.be  This test is no t yet approved or cleared by the [place] FDA and  has been authorized for detection and/or diagnosis of SARS-CoV-2 by FDA under an Emergency Use Authorization (EUA). This EUA will remain  in effect (meaning this test can be used) for the duration of the COVID-19 declaration under Section 564(b)(1) of the Act, 21 U.S.C.section 360bbb-3(b)(1), unless the authorization is terminated  or revoked sooner.       Influenza A by PCR NEGATIVE NEGATIVE Final   Influenza B by PCR NEGATIVE NEGATIVE Final    Comment: (NOTE) The Xpert Xpress SARS-CoV-2/FLU/RSV plus assay is intended as an aid in the diagnosis of influenza from Nasopharyngeal swab specimens and should not be used as a sole basis for  treatment. Nasal washings and aspirates are unacceptable for Xpert Xpress SARS-CoV-2/FLU/RSV testing.  Fact Sheet for Patients: EntrepreneurPulse.com.au  Fact Sheet for Healthcare Providers: IncredibleEmployment.be  This test is not yet approved or cleared by the [place] FDA and has been authorized for detection and/or diagnosis of SARS-CoV-2 by FDA under an Emergency Use Authorization (EUA). This EUA will remain in effect (meaning this test can be used) for the duration of the COVID-19 declaration under Section 564(b)(1) of the Act, 21 U.S.C. section 360bbb-3(b)(1), unless the authorization is terminated or revoked.  Performed at [facility], [address] [place], [place] [address]          Radiology Studies: CT HEAD WO CONTRAST (5MM)  Result Date: 10/14/2020 CLINICAL DATA:  Right-sided headache, recent stroke EXAM: CT HEAD WITHOUT CONTRAST TECHNIQUE: Contiguous axial images were obtained from the base of the skull through the vertex without intravenous contrast. COMPARISON:  10/06/2020 CT head, 10/07/2020 MRI head FINDINGS: Brain: Area of hypodensity in the right parietal and occipital lobes, which  correlates with the area of infarct seen on the 10/06/2020 CT and 10/07/2020 MRI, with edema in this area. Subtle areas of hyperdensity, likely petechial hemorrhage. No evidence of hemorrhagic transformation. No new areas of infarction. No significant midline shift. No mass or extra-axial collection. Vascular: No hyperdense vessel or unexpected calcification. Skull: Normal. Negative for fracture or focal lesion. Sinuses/Orbits: Mucosal thickening in the left frontal sinus and bilateral ethmoid air cells. The orbits are unremarkable. Other: None. IMPRESSION: Expected evolution of the previously noted right MCA/PCA territory infarct, with petechial hemorrhage and mild edema, without midline shift or hemorrhagic transformation. Electronically  Signed   By: Merilyn Baba MD   On: 10/14/2020 10:02   MR BRAIN WO CONTRAST  Result Date: 10/14/2020 CLINICAL DATA:  Stroke, follow up.  Eval for progression of stroke. EXAM: MRI HEAD WITHOUT CONTRAST TECHNIQUE: Multiplanar, multiecho pulse sequences of the brain and surrounding structures were obtained without intravenous contrast. COMPARISON:  Head CT 10/14/2020 and MRI 10/07/2020 FINDINGS: The study is intermittently motion degraded including severe motion on the coronal T2 sequence. Brain: The moderately large infarct involving the posterior right MCA territory and PCA border zone has mildly expanded compared to the prior MRI, most notable anteriorly and superiorly. Associated petechial hemorrhage is partially confluent and has increased from the prior MRI. There is progressive cytotoxic edema with regional sulcal effacement and mass effect on the right lateral ventricle. New mildly restricted diffusion in the splenium of the corpus callosum may reflect acute wallerian degeneration. Small cortical and subcortical infarcts in the right frontal lobe (border zone distribution) have also mildly increased from the prior MRI. Small T2 hyperintensities elsewhere in the cerebral white matter bilaterally are similar to the prior MRI and are nonspecific but compatible with mild chronic small vessel ischemic disease. There is no midline shift or extra-axial fluid collection. A chronic hemorrhagic infarct is again noted in the posterior aspect of the left lentiform nucleus/external capsule. Vascular: Major intracranial vascular flow voids are preserved. Skull and upper cervical spine: Unremarkable bone marrow signal para Sinuses/Orbits: Unremarkable orbits. Mild mucosal thickening in the paranasal sinuses. Clear mastoid air cells. Other: None. IMPRESSION: 1. Mild enlargement of the right MCA/border zone infarct since the 10/07/2020 MRI. Increased cytotoxic edema and petechial hemorrhage without midline shift. 2. Chronic  small-vessel ischemia including an old hemorrhagic infarct in the left basal ganglia. Electronically Signed   By: Logan Bores M.D.   On: 10/14/2020 18:22   EEG adult  Result Date: 10/15/2020 Lora Havens, MD     10/15/2020 10:31 AM Patient Name: DEARIUS MCENTIRE MRN: [id number] Epilepsy Attending: Lora Havens Referring Physician/Provider: Dr Lesleigh Noe Date: 10/15/2020 Duration: 23.02 mins Patient history: 70 year old male with posterior right parietal watershed infarct who presented with worsening stroke symptoms as well as left thumb rhythmic and nonsuppressible shaking movements.  EEG to evaluate for seizures. Level of alertness: Awake, asleep AEDs during EEG study: LEV Technical aspects: This EEG study was done with scalp electrodes positioned according to the 10-20 International system of electrode placement. Electrical activity was acquired at a sampling rate of '500Hz'$  and reviewed with a high frequency filter of '70Hz'$  and a low frequency filter of '1Hz'$ . EEG data were recorded continuously and digitally stored. Description: The posterior dominant rhythm consists of 8-9 Hz activity of moderate voltage (25-35 uV) seen predominantly in posterior head regions, asymmetric ( R<L) and reactive to eye opening and eye closing. Sleep was characterized by vertex waves, sleep spindles (12 to 14 Hz), maximal  frontocentral region.  EEG showed continuous rhythmic 3 to 5 Hz theta-delta slowing in right hemisphere. Hyperventilation and photic stimulation were not performed.   ABNORMALITY - Continuous slow, right hemisphere - Background asymmetry, right<left IMPRESSION: This study is suggestive of cortical dysfunction in right hemisphere, likely secondary to underlying stroke.  No seizures or epileptiform discharges were seen throughout the recording. Priyanka Barbra Sarks        Scheduled Meds:  atorvastatin  40 mg Oral Daily   finasteride  5 mg Oral Q0600   levETIRAcetam  500 mg Oral BID   psyllium  1 packet  Oral Daily   Continuous Infusions:   LOS: 1 day       Phillips Climes, MD [facility]   To contact the attending provider between 7A-7P or the covering provider during after hours 7P-7A, please log into the web site [email or web address] and access using universal  password for that web site. If you do not have the password, please call the hospital operator.  10/15/2020, 11:54 AM

## 2020-10-15 NOTE — Progress Notes (Signed)
LTM started; no initial skin breakdown; [facility] notified, event button tested.

## 2020-10-15 NOTE — Progress Notes (Signed)
EEG complete - results pending 

## 2020-10-15 NOTE — Plan of Care (Signed)

## 2020-10-15 NOTE — Progress Notes (Signed)
PT Evaluation  PTA, patient visiting brother and from [place]. Patient was recently admitted for CVA with L visual field deficit and L sided weakness. Patient presents with L sided weakness, impaired coordination, impaired sensation, impaired balance, decreased activity tolerance, and impaired cognition/awareness. Patient required minA for ambulation and frequent cueing for scanning of L side environment. Patient running into obstacles with difficulty problem solving how to negotiate around without cueing. Patient unable to identify room numbers on L during ambulation when asked to find numbers. Patient demos L inattention with mobility. Patient will benefit from skilled PT services during acute stay to address listed deficits. Recommend CIR level therapies at discharge to maximize functional independence and safety.     10/15/20 0940  PT Visit Information  Last PT Received On 10/15/20  Assistance Needed +1  History of Present Illness 70 y/o male presented to ED on 8/11 for R sided headache and worsening L UE weakness. Patient recently admitted 8/3-8/5 for R CVA with L visual deficits and L sided weakness. MRI with mild enlargement of R MCA/border zone infarct since 10/07/20 MRI and increased cytotoxic edema and petechial hemorrhage without midline shift. PMH: HTN, HLD, hx of PE on coumadin, CVA 10/2020  Precautions  Precautions Fall  Precaution Comments L visual field deficit, L inattention  Restrictions  Weight Bearing Restrictions No  Home Living  Family/patient expects to be discharged to: Private residence  Living Arrangements Other relatives (brother currently)  Available Help at Discharge Family;Available 24 hours/day  Type of Home House  Home Access Level entry  Home Layout One level  Bathroom Shower/Tub Tub/shower unit  [employer] officer, [employer] - single point  Prior Function  Level of Independence Independent with assistive device(s)  Comments uses SPC due to PAD,  no assist with ADL and IADL, not driving since last admission 8/3-8/5 for CVA  Communication  Communication No difficulties  Pain Assessment  Pain Assessment Faces  Faces Pain Scale 4  Pain Location head  Pain Descriptors / Indicators Headache  Pain Intervention(s) Monitored during session  Cognition  Arousal/Alertness Awake/alert  Behavior During Therapy WFL for tasks assessed/performed  Overall Cognitive Status Impaired/Different from baseline  Area of Impairment Problem solving;Awareness  Awareness Emergent  Problem Solving Slow processing;Requires verbal cues  General Comments Patient aware of deficits but difficulty compensating for visual deficit. Slow processing during scavenger hunt when provided verbal cues to problem solve finding room numbers  Upper Extremity Assessment  Upper Extremity Assessment Defer to OT evaluation  Lower Extremity Assessment  Lower Extremity Assessment LLE deficits/detail  LLE Deficits / Details grossly 4+/5. Difficulty performing heel to shin on L and demos dysdiadochokinesis  LLE Sensation decreased light touch  LLE Coordination decreased fine motor;decreased gross motor  Cervical / Trunk Assessment  Cervical / Trunk Assessment Kyphotic  Bed Mobility  Overal bed mobility Needs Assistance  Bed Mobility Supine to Sit;Sit to Supine  Supine to sit Supervision  Sit to supine Supervision  General bed mobility comments supervision for safety. Increased time to reach EOB  Transfers  Overall transfer level Needs assistance  Equipment used None  Transfers Sit to/from Stand  Sit to Stand Min guard  General transfer comment min guard for safety. Unsteady upon standing but able to self correct  Ambulation/Gait  Ambulation/Gait assistance Min assist  Gait Distance (Feet) 200 Feet  Assistive device 1 person hand held assist  Gait Pattern/deviations Step-through pattern;Decreased stride length;Drifts right/left  General Gait Details MinA for balance with  LOB x  3 during ambulation. Running into objects on L x 3 instances with difficulty problem solving negotiation and required verbal cues. Provided patient with room numbers to identify with patient unable to locate items on the L without numerous verbal cues to look completely to L side to find room numbers.  Gait velocity decreased  Modified Rankin (Stroke Patients Only)  Pre-Morbid Rankin Score 4  Modified Rankin 4  Balance  Overall balance assessment Mild deficits observed, not formally tested  PT - End of Session  Equipment Utilized During Treatment Gait belt  Activity Tolerance Patient tolerated treatment well  Patient left in bed;with call [place]/phone within reach;with bed alarm set  Nurse Communication Mobility status  PT Assessment  PT Recommendation/Assessment Patient needs continued PT services  PT Visit Diagnosis Unsteadiness on feet (R26.81);Muscle weakness (generalized) (M62.81);Other symptoms and signs involving the nervous system (R29.898);Other abnormalities of gait and mobility (R26.89)  PT Problem List Decreased strength;Decreased activity tolerance;Decreased balance;Decreased mobility;Decreased cognition;Impaired sensation;Decreased coordination  PT Plan  PT Frequency (ACUTE ONLY) Min 4X/week  PT Treatment/Interventions (ACUTE ONLY) DME instruction;Gait training;Functional mobility training;Therapeutic activities;Therapeutic exercise;Balance training;Neuromuscular re-education;Patient/family education  AM-PAC PT "6 Clicks" Mobility Outcome Measure (Version 2)  Help needed turning from your back to your side while in a flat bed without using bedrails? 3  Help needed moving from lying on your back to sitting on the side of a flat bed without using bedrails? 3  Help needed moving to and from a bed to a chair (including a wheelchair)? 3  Help needed standing up from a chair using your arms (e.g., wheelchair or bedside chair)? 3  Help needed to walk in hospital room? 3  Help  needed climbing 3-5 steps with a railing?  3  6 Click Score 18  Consider Recommendation of Discharge To: Home with [facility]  Progressive Mobility  What is the highest level of mobility based on the progressive mobility assessment? Level 5 (Walks with assist in room/hall) - Balance while stepping forward/back and can walk in room with assist - Complete  Mobility Ambulated with assistance in hallway  PT Recommendation  Recommendations for Other Services Rehab consult  Follow Up Recommendations CIR  PT equipment Other (comment) (TBD)  Individuals Consulted  Consulted and Agree with Results and Recommendations Patient  Acute Rehab PT Goals  Patient Stated Goal to be normal again  PT Goal Formulation With patient  Time For Goal Achievement 10/29/20  Potential to Achieve Goals Good  PT Time Calculation  PT Start Time (ACUTE ONLY) 0902  PT Stop Time (ACUTE ONLY) 0931  PT Time Calculation (min) (ACUTE ONLY) 29 min  PT General Charges  $$ ACUTE PT VISIT 1 Visit  PT Evaluation  $PT Eval Moderate Complexity 1 Mod  PT Treatments  $Therapeutic Activity 8-22 mins    Kesler Wickham A. Gilford Rile, PT, DPT Acute Rehabilitation Services Pager [phone] Office [phone]

## 2020-10-15 NOTE — TOC Initial Note (Signed)
Transition of Care [facility]) - Initial/Assessment Note    Patient Details  Name: Clifford [name] MRN: [id number] Date of Birth: 1950-11-02  Transition of Care [facility]) CM/SW Contact:    Pollie Friar, RN Phone Number: 10/15/2020, 3:15 PM  Clinical Narrative:                 Pt readmitted with lt sided weakness. New recommendations are for CIR. He has been seen at [facility]. The [id number] ext [phone] TOC following.  Expected Discharge Plan: IP Rehab Facility Barriers to Discharge: Continued Medical Work up   Patient Goals and CMS Choice     Choice offered to / list presented to : Patient  Expected Discharge Plan and Services Expected Discharge Plan: [place]     Post Acute Care Choice: IP Rehab                                        Prior Living Arrangements/Services                       Activities of Daily Living Home Assistive Devices/Equipment: Cane (specify quad or straight) ADL Screening (condition at time of admission) Patient's cognitive ability adequate to safely complete daily activities?: Yes Is the patient deaf or have difficulty hearing?: No Does the patient have difficulty seeing, even when wearing glasses/contacts?: Yes Does the patient have difficulty concentrating, remembering, or making decisions?: No Patient able to express need for assistance with ADLs?: Yes Does the patient have difficulty dressing or bathing?: Yes Independently performs ADLs?: No Communication: Independent Dressing (OT): Needs assistance Is this a change from baseline?: Change from baseline, expected to last <3days Grooming: Independent Feeding: Independent Bathing: Independent Toileting: Independent In/Out Bed: Needs assistance Is this a change from baseline?: Change from baseline, expected to last <3 days Walks in Home: Independent Does the patient have difficulty walking or climbing stairs?: Yes Weakness of Legs: None Weakness of  Arms/Hands: Left  Permission Sought/Granted                  Emotional Assessment              Admission diagnosis:  Weakness [R53.1] Left-sided weakness [R53.1] 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 [place] [address] Phone: [phone] Fax: [phone]     Social Determinants of Health (SDOH) Interventions    Readmission Risk Interventions No flowsheet data found.

## 2020-10-16 DIAGNOSIS — R531 Weakness: Secondary | ICD-10-CM | POA: Diagnosis not present

## 2020-10-16 DIAGNOSIS — R569 Unspecified convulsions: Secondary | ICD-10-CM | POA: Diagnosis not present

## 2020-10-16 LAB — CBC
HCT: 36.7 % — ABNORMAL LOW (ref 39.0–52.0)
Hemoglobin: 11.8 g/dL — ABNORMAL LOW (ref 13.0–17.0)
MCH: 26.3 pg (ref 26.0–34.0)
MCHC: 32.2 g/dL (ref 30.0–36.0)
MCV: 81.9 fL (ref 80.0–100.0)
Platelets: 169 10*3/uL (ref 150–400)
RBC: 4.48 MIL/uL (ref 4.22–5.81)
RDW: 14.9 % (ref 11.5–15.5)
WBC: 4.4 10*3/uL (ref 4.0–10.5)
nRBC: 0 % (ref 0.0–0.2)

## 2020-10-16 LAB — BASIC METABOLIC PANEL
Anion gap: 6 (ref 5–15)
BUN: 15 mg/dL (ref 8–23)
CO2: 24 mmol/L (ref 22–32)
Calcium: 8.8 mg/dL — ABNORMAL LOW (ref 8.9–10.3)
Chloride: 104 mmol/L (ref 98–111)
Creatinine, Ser: 1.25 mg/dL — ABNORMAL HIGH (ref 0.61–1.24)
GFR, Estimated: >60 mL/min (ref >60)
Glucose, Bld: 104 mg/dL — ABNORMAL HIGH (ref 70–99)
Potassium: 4.1 mmol/L (ref 3.5–5.1)
Sodium: 134 mmol/L — ABNORMAL LOW (ref 135–145)

## 2020-10-16 NOTE — Progress Notes (Signed)
Physical Therapy Treatment Patient Details Name: Clifford [name] MRN: [id number] DOB: 31-Dec-1950 Today's Date: 10/16/2020    History of Present Illness 70 y/o male presented to ED on 8/11 for R sided headache and worsening L UE weakness. Patient recently admitted 8/3-8/5 for R CVA with L visual deficits and L sided weakness. MRI with mild enlargement of R MCA/border zone infarct since 10/07/20 MRI and increased cytotoxic edema and petechial hemorrhage without midline shift. PMH: HTN, HLD, hx of PE on coumadin, CVA 10/2020    PT Comments    Pt's function today comparable to end of last session. Pt repeatedly asking if he can go home today, decreased awareness of limitations and safety. Pt needed mod A to come to EOB as well as mod A for sit to stand due to heavy L lean. Worked on standing and pregait in front of mirror for increased feedback re: body position. Discussed CIR with pt and even though he wants to go home, is agreeable to intense rehab first. PT will continue to follow.    Follow Up Recommendations  CIR     Equipment Recommendations  Other (comment) (TBD)    Recommendations for Other Services Rehab consult     Precautions / Restrictions Precautions Precautions: Fall Precaution Comments: L visual field deficit, L inattention Restrictions Weight Bearing Restrictions: No    Mobility  Bed Mobility Overal bed mobility: Needs Assistance Bed Mobility: Supine to Sit     Supine to sit: Mod assist     General bed mobility comments: pt intiated bed mobility but could not problem solve to elevate trunk. Mod A for LLE off EOB and mod A for elevation of trunk to L, pt falling back to R with initial sitting    Transfers Overall transfer level: Needs assistance Equipment used: None Transfers: Sit to/from [facility] Sit to Stand: Mod assist Stand pivot transfers: Mod assist       General transfer comment: mod A for power up due to L lean. Improved to min A after  many trials from bed and recliner. Mod A to take pivot steps to chair on R side.  Ambulation/Gait Ambulation/Gait assistance: Mod assist   Assistive device: 1 person hand held assist Gait Pattern/deviations: Drifts right/left     General Gait Details: pt unable to ambulate safely with 1 person assist today. Worked on pregait activities in standing with R hand on back of chair and then at sink. Worked on fwd and bkwd stepping with R and LLE as well as wt shifting.   Stairs             Wheelchair Mobility    Modified Rankin (Stroke Patients Only) Modified Rankin (Stroke Patients Only) Pre-Morbid Rankin Score: Moderately severe disability Modified Rankin: Moderately severe disability     Balance Overall balance assessment: Needs assistance Sitting-balance support: Single extremity supported;Feet supported Sitting balance-Leahy Scale: Poor Sitting balance - Comments: able to maintain balance with supervision when sitting back in bed with tray table in front of pt. But pt needed mod A to maintain safe sitting when scooted fwd to EOB due to inability to manage L side Postural control: Left lateral lean Standing balance support: Single extremity supported Standing balance-Leahy Scale: Poor Standing balance comment: heavy L lean in standing. Worked on maintaining midline in front of mirror at [employer]  Arousal/Alertness: Awake/alert Behavior During Therapy: WFL for tasks assessed/performed Overall Cognitive Status: Impaired/Different from baseline Area of Impairment: Problem solving;Awareness                           Awareness: Emergent Problem Solving: Slow processing;Requires verbal cues General Comments: decreased awareness of deficits functionally, can verbalize them but does not respond accordingly      Exercises      General Comments General comments (skin integrity, edema, etc.): pt kept asking if he could  go home today, does not fully understand his lack of safety      Pertinent Vitals/Pain Pain Assessment: Faces Faces Pain Scale: Hurts little more Pain Location: head Pain Descriptors / Indicators: Headache Pain Intervention(s): Limited activity within patient's tolerance;Monitored during session    Home Living   Living Arrangements: Children Available Help at Discharge: Family;Available 24 hours/day Type of Home: House Home Access: Level entry   Home Layout: Two level        Prior Function            PT Goals (current goals can now be found in the care plan section) Acute Rehab PT Goals Patient Stated Goal: to get my vision normal PT Goal Formulation: With patient Time For Goal Achievement: 10/29/20 Potential to Achieve Goals: Good Progress towards PT goals: Progressing toward goals    Frequency    Min 4X/week      PT Plan Current plan remains appropriate    Co-evaluation              AM-PAC PT "6 Clicks" Mobility   Outcome Measure  Help needed turning from your back to your side while in a flat bed without using bedrails?: A Lot Help needed moving from lying on your back to sitting on the side of a flat bed without using bedrails?: A Lot Help needed moving to and from a bed to a chair (including a wheelchair)?: A Lot Help needed standing up from a chair using your arms (e.g., wheelchair or bedside chair)?: A Lot Help needed to walk in hospital room?: A Lot Help needed climbing 3-5 steps with a railing? : Total 6 Click Score: 11    End of Session Equipment Utilized During Treatment: Gait belt Activity Tolerance: Patient tolerated treatment well Patient left: with call [place]/phone within reach;in chair;with chair alarm set Nurse Communication: Mobility status PT Visit Diagnosis: Unsteadiness on feet (R26.81);Muscle weakness (generalized) (M62.81);Other symptoms and signs involving the nervous system (R29.898);Other abnormalities of gait and mobility  (R26.89)     Time: [id number] PT Time Calculation (min) (ACUTE ONLY): 32 min  Charges:  $Gait Training: 8-22 mins $Therapeutic Activity: 8-22 mins                     [place]  Pager [phone] Office [place] 10/16/2020, 4:42 PM

## 2020-10-16 NOTE — Progress Notes (Addendum)
PROGRESS NOTE    Clifford [name]  [id number] DOB: Jul 04, 1950 DOA: 10/14/2020 PCP: Pcp, No   Chief Complaint  Patient presents with   Altered Mental Status    Brief Narrative: 70 year old male with diastolic CHF (EF 65 to [id number] , G1 DD), HTN, HLD multiple PE on lifelong anticoagulation, with recent right MCA/PCA watershed ischemic stroke presented with increasing left upper extremity weakness, left visual field defect.  Work-up in the ED showed MRI with evolving and increased size of the stroke, petechial hemorrhages, cytotoxic edema and concern for new seizures.  Patient was admitted and neuro was consulted Subjective:  Seen examined Resting comfortably, LUE weakness is about the same and has not worsened, no headache Overnight afebrile heart rate 40s to 100, blood pressure 150s to 170s, on room air  Assessment & Plan:  Left-sided weakness secondary to worsening right parietal occipital MCA/PCA watershed stroke complicated by worsening petechial hemorrhage: MRI brain reviewed, neurology input appreciated.  Holding Pradaxa.  Repeat CT head 8/12 afternoon shows no apparent change.  Continue plan of care as per neurology.  Continue PT OT-has advised CIR  Concern for seizure: In the setting of #1, continue on on EEG monitoring significant for cortical dysfunction and right hemiparesis but no epileptiform discharges.  Currently on Keppra. Eeg completed this am.   Essential hypertension: Blood pressure 150s 160s.  Long-term goal normotensive home HCTZ and lisinopril on hold if continues to trend up will resume at least 1  Mixed hyperlipidemia: On Lipitor. 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  Will resume once okay with neurology  Nicotine dependence-declined  nicotine. Agrees to quit, counseling done.  Morbid obesity with BMI 35: will benefit with wright  loss, healthy lifestyle and PCP follow ups  Diet Order             Diet Heart Room service appropriate? Yes; Fluid consistency: Thin  Diet effective now                   Patient's Body mass index is 35.11 kg/m.  DVT prophylaxis: SCD's Start: 10/14/20 2158 Code Status:   Code Status: Full Code  Family Communication: plan of care discussed with patient at bedside. Status is: Inpatient  Remains inpatient appropriate because:Ongoing diagnostic testing needed not appropriate for outpatient work up  Dispo: The patient is from: Home              Anticipated d/c is to:  TBD  CIR vs SNF              Patient currently is not medically stable to d/c.   Difficult to place patient No Unresulted Labs (From admission, onward)    None       Medications reviewed:  Scheduled Meds:  atorvastatin  40 mg Oral Daily   finasteride  5 mg Oral Q0600   levETIRAcetam  1,000 mg Oral BID   psyllium  1 packet Oral Daily   Continuous Infusions: Consultants:see note  Procedures:see note Antimicrobials: Anti-infectives (From admission, onward)    None      Culture/Microbiology No results found for: SDES, SPECREQUEST, CULT, REPTSTATUS  Other culture-see note  Objective: Vitals: Today's Vitals   10/15/20 2335 10/16/20 0014 10/16/20 0401 10/16/20 0829  BP: (!) 169/83  (!) 151/79 (!) 163/61  Pulse: (!) 50  (!) 52 (!) 55  Resp: 18  17  18  Temp: 99.3 F (37.4 C)  98 F (36.7 C) 98.1 F (36.7 C)  TempSrc: Oral     SpO2: 100%  100% 100%  Weight:      Height:      PainSc:  Asleep      Intake/Output Summary (Last 24 hours) at 10/16/2020 0924 Last data filed at 10/15/2020 1100 Gross per 24 hour  Intake 240 ml  Output --  Net 240 ml   Filed Weights   10/14/20 0840  Weight: 95.7 kg   Weight change:   Intake/Output from previous day: 08/12 0701 - 08/13 0700 In: 240 [P.O.:240] Out: -  Intake/Output this shift: No intake/output data recorded. Filed Weights   10/14/20 0840   Weight: 95.7 kg   Examination: General exam: AAO X3, pleasant,older than stated age, weak appearing. HEENT:Oral mucosa moist, Ear/Nose WNL grossly,dentition normal. Respiratory system: bilaterally diminished, no use of accessory muscle, non tender. Cardiovascular system: S1 & S2 +,No JVD. Gastrointestinal system: Abdomen soft, NT,ND, BS+. Nervous System:Alert, awake, moving extremities well except LUE- weak gross power 3/5 Extremities: no edema, distal peripheral pulses palpable.  Skin: No rashes,no icterus. MSK: Normal muscle bulk,tone, power Data Reviewed: I have personally reviewed following labs and imaging studies CBC: Recent Labs  Lab 10/14/20 0913 10/14/20 0920 10/15/20 0500 10/16/20 0346  WBC  --  4.3 4.3 4.4  NEUTROABS  --  2.6 1.8  --   HGB 14.3 13.5 11.8* 11.8*  HCT 42.0 42.1 37.9* 36.7*  MCV  --  81.6 83.8 81.9  PLT  --  227 193 [id number]   Basic Metabolic Panel: Recent Labs  Lab 10/14/20 0913 10/14/20 0920 10/15/20 0500 10/16/20 0346  NA 140 137 135 134*  K 3.6 3.7 3.6 4.1  CL 104 104 102 104  CO2  --  '22 23 24  '$ GLUCOSE 118* 117* 86 104*  BUN '19 17 18 15  '$ CREATININE 1.20 1.30* 1.22 1.25*  CALCIUM  --  9.2 8.9 8.8*  MG  --   --  1.8  --    GFR: Estimated Creatinine Clearance: 57.7 mL/min (A) (by C-G formula based on SCr of 1.25 mg/dL (H)). Liver Function Tests: Recent Labs  Lab 10/14/20 0920 10/15/20 0500  AST 22 19  ALT 13 12  ALKPHOS 77 65  BILITOT 1.0 0.8  PROT 7.1 6.2*  ALBUMIN 3.6 3.0*   No results for input(s): LIPASE, AMYLASE in the last 168 hours. No results for input(s): AMMONIA in the last 168 hours. Coagulation Profile: Recent Labs  Lab 10/14/20 0920  INR 1.4*   Cardiac Enzymes: No results for input(s): CKTOTAL, CKMB, CKMBINDEX, TROPONINI in the last 168 hours. BNP (last 3 results) No results for input(s): PROBNP in the last 8760 hours. HbA1C: No results for input(s): HGBA1C in the last 72 hours. CBG: No results for input(s):  GLUCAP in the last 168 hours. Lipid Profile: No results for input(s): CHOL, HDL, LDLCALC, TRIG, CHOLHDL, LDLDIRECT in the last 72 hours. Thyroid Function Tests: No results for input(s): TSH, T4TOTAL, FREET4, T3FREE, THYROIDAB in the last 72 hours. Anemia Panel: No results for input(s): VITAMINB12, FOLATE, FERRITIN, TIBC, IRON, RETICCTPCT in the last 72 hours. Sepsis Labs: No results for input(s): PROCALCITON, LATICACIDVEN in the last 168 hours.  Recent Results (from the past 240 hour(s))  Resp Panel by RT-PCR (Flu A&B, Covid) Nasopharyngeal Swab     Status: None   Collection Time: 10/06/20  2:18 PM   Specimen: Nasopharyngeal Swab; Nasopharyngeal(NP) swabs in vial  transport medium  Result Value Ref Range Status   SARS Coronavirus 2 by RT PCR NEGATIVE NEGATIVE Final    Comment: (NOTE) SARS-CoV-2 target nucleic acids are NOT DETECTED.  The SARS-CoV-2 RNA is generally detectable in upper respiratory specimens during the acute phase of infection. The lowest concentration of SARS-CoV-2 viral copies this assay can detect is 138 copies/mL. A negative result does not preclude SARS-Cov-2 infection and should not be used as the sole basis for treatment or other patient management decisions. A negative result may occur with  improper specimen collection/handling, submission of specimen other than nasopharyngeal swab, presence of viral mutation(s) within the areas targeted by this assay, and inadequate number of viral copies(<138 copies/mL). A negative result must be combined with clinical observations, patient history, and epidemiological information. The expected result is Negative.  Fact Sheet for Patients:  EntrepreneurPulse.com.au  Fact Sheet for Healthcare Providers:  IncredibleEmployment.be  This test is no t yet approved or cleared by the [place] FDA and  has been authorized for detection and/or diagnosis of SARS-CoV-2 by FDA under an  Emergency Use Authorization (EUA). This EUA will remain  in effect (meaning this test can be used) for the duration of the COVID-19 declaration under Section 564(b)(1) of the Act, 21 U.S.C.section 360bbb-3(b)(1), unless the authorization is terminated  or revoked sooner.       Influenza A by PCR NEGATIVE NEGATIVE Final   Influenza B by PCR NEGATIVE NEGATIVE Final    Comment: (NOTE) The Xpert Xpress SARS-CoV-2/FLU/RSV plus assay is intended as an aid in the diagnosis of influenza from Nasopharyngeal swab specimens and should not be used as a sole basis for treatment. Nasal washings and aspirates are unacceptable for Xpert Xpress SARS-CoV-2/FLU/RSV testing.  Fact Sheet for Patients: EntrepreneurPulse.com.au  Fact Sheet for Healthcare Providers: IncredibleEmployment.be  This test is not yet approved or cleared by the [place] FDA and has been authorized for detection and/or diagnosis of SARS-CoV-2 by FDA under an Emergency Use Authorization (EUA). This EUA will remain in effect (meaning this test can be used) for the duration of the COVID-19 declaration under Section 564(b)(1) of the Act, 21 U.S.C. section 360bbb-3(b)(1), unless the authorization is terminated or revoked.  Performed at [facility], [address] [place], [place] [address]   Resp Panel by RT-PCR (Flu A&B, Covid) Nasopharyngeal Swab     Status: None   Collection Time: 10/14/20  7:06 PM   Specimen: Nasopharyngeal Swab; Nasopharyngeal(NP) swabs in vial transport medium  Result Value Ref Range Status   SARS Coronavirus 2 by RT PCR NEGATIVE NEGATIVE Final    Comment: (NOTE) SARS-CoV-2 target nucleic acids are NOT DETECTED.  The SARS-CoV-2 RNA is generally detectable in upper respiratory specimens during the acute phase of infection. The lowest concentration of SARS-CoV-2 viral copies this assay can detect is 138 copies/mL. A negative result does not preclude  SARS-Cov-2 infection and should not be used as the sole basis for treatment or other patient management decisions. A negative result may occur with  improper specimen collection/handling, submission of specimen other than nasopharyngeal swab, presence of viral mutation(s) within the areas targeted by this assay, and inadequate number of viral copies(<138 copies/mL). A negative result must be combined with clinical observations, patient history, and epidemiological information. The expected result is Negative.  Fact Sheet for Patients:  EntrepreneurPulse.com.au  Fact Sheet for Healthcare Providers:  IncredibleEmployment.be  This test is no t yet approved or cleared by the [place] and  has been authorized for detection and/or diagnosis of SARS-CoV-2 by FDA under an Emergency Use Authorization (EUA). This EUA will remain  in effect (meaning this test can be used) for the duration of the COVID-19 declaration under Section 564(b)(1) of the Act, 21 U.S.C.section 360bbb-3(b)(1), unless the authorization is terminated  or revoked sooner.       Influenza A by PCR NEGATIVE NEGATIVE Final   Influenza B by PCR NEGATIVE NEGATIVE Final    Comment: (NOTE) The Xpert Xpress SARS-CoV-2/FLU/RSV plus assay is intended as an aid in the diagnosis of influenza from Nasopharyngeal swab specimens and should not be used as a sole basis for treatment. Nasal washings and aspirates are unacceptable for Xpert Xpress SARS-CoV-2/FLU/RSV testing.  Fact Sheet for Patients: EntrepreneurPulse.com.au  Fact Sheet for Healthcare Providers: IncredibleEmployment.be  This test is not yet approved or cleared by the [place] FDA and has been authorized for detection and/or diagnosis of SARS-CoV-2 by FDA under an Emergency Use Authorization (EUA). This EUA will remain in effect (meaning this test can be used) for the duration of  the COVID-19 declaration under Section 564(b)(1) of the Act, 21 U.S.C. section 360bbb-3(b)(1), unless the authorization is terminated or revoked.  Performed at [facility], [place] [address], [place], [place] [address]      Radiology Studies: CT HEAD WO CONTRAST (5MM)  Result Date: 10/15/2020 CLINICAL DATA:  Neuro deficit, acute, stroke suspected. New focal defects. EXAM: CT HEAD WITHOUT CONTRAST TECHNIQUE: Contiguous axial images were obtained from the base of the skull through the vertex without intravenous contrast. COMPARISON:  Head CT and MRI yesterday. FINDINGS: Brain: Confluent acute infarction in the right posterior temporal and temporoparietal junction regions shows discrete low-density by CT, with minimal petechial blood products but no frank hematoma. Smaller areas cortical and subcortical infarction in the right frontal and frontoparietal cortex and underlying white matter are seen by CT is subtle findings. No hemorrhage in that region. No new insult is identified. Mild swelling but no significant mass effect or any midline shift. No hydrocephalus. No extra-axial collection. Vascular: There is atherosclerotic calcification of the major vessels at the base of the brain. Skull: Negative Sinuses/Orbits: Clear/normal Other: None IMPRESSION: No apparent change since yesterday. Confluent infarction at the right posterior temporal and temporoparietal junction region with petechial blood products but no frank hematoma. Smaller more subtle infarctions of the cortical and subcortical brain in the right frontal and frontoparietal region appear unchanged. Electronically Signed   By: Nelson Chimes M.D.   On: 10/15/2020 15:16   CT HEAD WO CONTRAST (5MM)  Result Date: 10/14/2020 CLINICAL DATA:  Right-sided headache, recent stroke EXAM: CT HEAD WITHOUT CONTRAST TECHNIQUE: Contiguous axial images were obtained from the base of the skull through the vertex without intravenous contrast. COMPARISON:   10/06/2020 CT head, 10/07/2020 MRI head FINDINGS: Brain: Area of hypodensity in the right parietal and occipital lobes, which correlates with the area of infarct seen on the 10/06/2020 CT and 10/07/2020 MRI, with edema in this area. Subtle areas of hyperdensity, likely petechial hemorrhage. No evidence of hemorrhagic transformation. No new areas of infarction. No significant midline shift. No mass or extra-axial collection. Vascular: No hyperdense vessel or unexpected calcification. Skull: Normal. Negative for fracture or focal lesion. Sinuses/Orbits: Mucosal thickening in the left frontal sinus and bilateral ethmoid air cells. The orbits are unremarkable. Other: None. IMPRESSION: Expected evolution of the previously noted right MCA/PCA territory infarct, with petechial hemorrhage and mild edema, without midline shift or hemorrhagic transformation. Electronically  Signed   By: Merilyn Baba MD   On: 10/14/2020 10:02   MR BRAIN WO CONTRAST  Result Date: 10/14/2020 CLINICAL DATA:  Stroke, follow up.  Eval for progression of stroke. EXAM: MRI HEAD WITHOUT CONTRAST TECHNIQUE: Multiplanar, multiecho pulse sequences of the brain and surrounding structures were obtained without intravenous contrast. COMPARISON:  Head CT 10/14/2020 and MRI 10/07/2020 FINDINGS: The study is intermittently motion degraded including severe motion on the coronal T2 sequence. Brain: The moderately large infarct involving the posterior right MCA territory and PCA border zone has mildly expanded compared to the prior MRI, most notable anteriorly and superiorly. Associated petechial hemorrhage is partially confluent and has increased from the prior MRI. There is progressive cytotoxic edema with regional sulcal effacement and mass effect on the right lateral ventricle. New mildly restricted diffusion in the splenium of the corpus callosum may reflect acute wallerian degeneration. Small cortical and subcortical infarcts in the right frontal lobe  (border zone distribution) have also mildly increased from the prior MRI. Small T2 hyperintensities elsewhere in the cerebral white matter bilaterally are similar to the prior MRI and are nonspecific but compatible with mild chronic small vessel ischemic disease. There is no midline shift or extra-axial fluid collection. A chronic hemorrhagic infarct is again noted in the posterior aspect of the left lentiform nucleus/external capsule. Vascular: Major intracranial vascular flow voids are preserved. Skull and upper cervical spine: Unremarkable bone marrow signal para Sinuses/Orbits: Unremarkable orbits. Mild mucosal thickening in the paranasal sinuses. Clear mastoid air cells. Other: None. IMPRESSION: 1. Mild enlargement of the right MCA/border zone infarct since the 10/07/2020 MRI. Increased cytotoxic edema and petechial hemorrhage without midline shift. 2. Chronic small-vessel ischemia including an old hemorrhagic infarct in the left basal ganglia. Electronically Signed   By: Logan Bores M.D.   On: 10/14/2020 18:22   EEG adult  Result Date: 10/15/2020 Lora Havens, MD     10/15/2020 10:31 AM Patient Name: YASUO PAFFORD MRN: [id number] Epilepsy Attending: Lora Havens Referring Physician/Provider: Dr Lesleigh Noe Date: 10/15/2020 Duration: 23.02 mins Patient history: 70 year old male with posterior right parietal watershed infarct who presented with worsening stroke symptoms as well as left thumb rhythmic and nonsuppressible shaking movements.  EEG to evaluate for seizures. Level of alertness: Awake, asleep AEDs during EEG study: LEV Technical aspects: This EEG study was done with scalp electrodes positioned according to the 10-20 International system of electrode placement. Electrical activity was acquired at a sampling rate of '500Hz'$  and reviewed with a high frequency filter of '70Hz'$  and a low frequency filter of '1Hz'$ . EEG data were recorded continuously and digitally stored. Description: The posterior  dominant rhythm consists of 8-9 Hz activity of moderate voltage (25-35 uV) seen predominantly in posterior head regions, asymmetric ( R<L) and reactive to eye opening and eye closing. Sleep was characterized by vertex waves, sleep spindles (12 to 14 Hz), maximal frontocentral region.  EEG showed continuous rhythmic 3 to 5 Hz theta-delta slowing in right hemisphere. Hyperventilation and photic stimulation were not performed.   ABNORMALITY - Continuous slow, right hemisphere - Background asymmetry, right<left IMPRESSION: This study is suggestive of cortical dysfunction in right hemisphere, likely secondary to underlying stroke.  No seizures or epileptiform discharges were seen throughout the recording. Lora Havens     LOS: 2 days   Antonieta Pert, MD [facility]  10/16/2020, 9:24 AM

## 2020-10-16 NOTE — Progress Notes (Addendum)
Found EEG leads/connections off of patient's head at 0248, box for the EEG connections were found on the floor. Patient is turned away from door, sound asleep. Speculating that connection box fell off bed and EEG leads were accidentally pulled off patient's head.  Based on computer, it looks like the connections came off at around [place].   Charge RN Kyung Rudd was made aware on unit. Neuro Night MD (S. Bhagat) has been paged to make aware.

## 2020-10-16 NOTE — Progress Notes (Signed)
STROKE TEAM PROGRESS NOTE     SUBJECTIVE (INTERVAL HISTORY) His EEG leads fell of his scalp. Not sure how much was captured. Per nurse, he is much better. Less confused and no further arm/hand twitching. He has no complaints today.  No seizures reported.   OBJECTIVE Temp:  [98 F (36.7 C)-99.3 F (37.4 C)] 98.1 F (36.7 C) (08/13 0829) Pulse Rate:  [47-55] 47 (08/13 1227) Cardiac Rhythm: Sinus bradycardia;Other (Comment) (08/13 0801) Resp:  [16-18] 16 (08/13 1227) BP: (140-172)/(61-83) 140/62 (08/13 1227) SpO2:  [100 %] 100 % (08/13 1227)  No results for input(s): GLUCAP in the last 168 hours. Recent Labs  Lab 10/14/20 0913 10/14/20 0920 10/15/20 0500 10/16/20 0346  NA 140 137 135 134*  K 3.6 3.7 3.6 4.1  CL 104 104 102 104  CO2  --  '22 23 24  '$ GLUCOSE 118* 117* 86 104*  BUN '19 17 18 15  '$ CREATININE 1.20 1.30* 1.22 1.25*  CALCIUM  --  9.2 8.9 8.8*  MG  --   --  1.8  --    Recent Labs  Lab 10/14/20 0920 10/15/20 0500  AST 22 19  ALT 13 12  ALKPHOS 77 65  BILITOT 1.0 0.8  PROT 7.1 6.2*  ALBUMIN 3.6 3.0*   Recent Labs  Lab 10/14/20 0913 10/14/20 0920 10/15/20 0500 10/16/20 0346  WBC  --  4.3 4.3 4.4  NEUTROABS  --  2.6 1.8  --   HGB 14.3 13.5 11.8* 11.8*  HCT 42.0 42.1 37.9* 36.7*  MCV  --  81.6 83.8 81.9  PLT  --  227 193 169   No results for input(s): CKTOTAL, CKMB, CKMBINDEX, TROPONINI in the last 168 hours. Recent Labs    10/14/20 0920  LABPROT 17.5*  INR 1.4*   No results for input(s): COLORURINE, LABSPEC, PHURINE, GLUCOSEU, HGBUR, BILIRUBINUR, KETONESUR, PROTEINUR, UROBILINOGEN, NITRITE, LEUKOCYTESUR in the last 72 hours.  Invalid input(s): APPERANCEUR     Component Value Date/Time   CHOL 128 10/07/2020 0450   TRIG 63 10/07/2020 0450   HDL 40 (L) 10/07/2020 0450   CHOLHDL 3.2 10/07/2020 0450   VLDL 13 10/07/2020 0450   LDLCALC 75 10/07/2020 0450   Lab Results  Component Value Date   HGBA1C 5.9 (H) 10/07/2020   No results found for:  LABOPIA, COCAINSCRNUR, LABBENZ, AMPHETMU, THCU, LABBARB  No results for input(s): ETH in the last 168 hours.  I have personally reviewed the radiological images below and agree with the radiology interpretations.  CT Angio Head W or Wo Contrast  Result Date: 10/06/2020 CLINICAL DATA:  Stroke. TIA. Assess intracranial arteries. Unsteady gait. Left-sided weakness. Left-sided visual loss. Headache. EXAM: CT ANGIOGRAPHY HEAD AND NECK TECHNIQUE: Multidetector CT imaging of the head and neck was performed using the standard protocol during bolus administration of intravenous contrast. Multiplanar CT image reconstructions and MIPs were obtained to evaluate the vascular anatomy. Carotid stenosis measurements (when applicable) are obtained utilizing NASCET criteria, using the distal internal carotid diameter as the denominator. CONTRAST:  129m OMNIPAQUE IOHEXOL 350 MG/ML SOLN COMPARISON:  Head CT earlier same day FINDINGS: CTA NECK FINDINGS Aortic arch: Aortic atherosclerosis.  Branching pattern is normal. Right carotid system: Common carotid artery is tortuous but widely patent to the bifurcation. Carotid bifurcation is widely patent. Minimal calcified plaque at the distal ICA bulb but no stenosis. Cervical ICA widely patent. Left carotid system: Common carotid artery is tortuous but widely patent to the bifurcation. Carotid bifurcation is normal. Minimal calcified plaque at  the ICA bulb but no stenosis. Cervical ICA widely patent beyond that. Vertebral arteries: Right vertebral artery origin is widely patent. Left vertebral artery origin is poorly seen because of regional venous reflux. Both vertebral arteries do appear patent through the cervical region to the foramen magnum. Skeleton: Ordinary cervical spondylosis. Other neck: No mass or lymphadenopathy. Upper chest: Upper lungs are clear. Review of the MIP images confirms the above findings CTA HEAD FINDINGS Anterior circulation: Both internal carotid arteries  are patent through the skull base and siphon regions. There is ordinary siphon atherosclerotic calcification. On the left, there is no stenosis. On the right, there is 50-70% stenosis in the siphon. Supraclinoid internal carotid arteries are widely patent. The anterior and middle cerebral vessels are patent. No large vessel occlusion, with particular attention to the right MCA. Posterior circulation: Both vertebral arteries are patent through the foramen magnum to the basilar. No basilar stenosis. Posterior circulation branch vessels are normal. Venous sinuses: Patent and normal. Anatomic variants: None significant. Review of the MIP images confirms the above findings IMPRESSION: No intracranial large vessel occlusion identified at this time, with specific attention to the right MCA branches. Minimal atherosclerotic change at both internal carotid artery bulbs. No stenosis. 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 No stenosis on the left. 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 Electronically Signed   By: Nelson Chimes M.D.   On: 10/06/2020 15:01   CT HEAD WO CONTRAST (5MM)  Result Date: 10/15/2020 CLINICAL DATA:  Neuro deficit, acute, stroke suspected. New focal defects. EXAM: CT HEAD WITHOUT CONTRAST TECHNIQUE: Contiguous axial images were obtained from the base of the skull through the vertex without intravenous contrast. COMPARISON:  Head CT and MRI yesterday. FINDINGS: Brain: Confluent acute infarction in the right posterior temporal and temporoparietal junction regions shows discrete low-density by CT, with minimal petechial blood products but no frank hematoma. Smaller areas cortical and subcortical infarction in the right frontal and frontoparietal cortex and underlying white matter are seen by CT is subtle findings. No hemorrhage in that region. No new insult is identified. Mild swelling but no significant mass effect or any midline shift. No hydrocephalus. No extra-axial  collection. Vascular: There is atherosclerotic calcification of the major vessels at the base of the brain. Skull: Negative Sinuses/Orbits: Clear/normal Other: None IMPRESSION: No apparent change since yesterday. Confluent infarction at the right posterior temporal and temporoparietal junction region with petechial blood products but no frank hematoma. Smaller more subtle infarctions of the cortical and subcortical brain in the right frontal and frontoparietal region appear unchanged. Electronically Signed   By: Nelson Chimes M.D.   On: 10/15/2020 15:16   CT HEAD WO CONTRAST (5MM)  Result Date: 10/14/2020 CLINICAL DATA:  Right-sided headache, recent stroke EXAM: CT HEAD WITHOUT CONTRAST TECHNIQUE: Contiguous axial images were obtained from the base of the skull through the vertex without intravenous contrast. COMPARISON:  10/06/2020 CT head, 10/07/2020 MRI head FINDINGS: Brain: Area of hypodensity in the right parietal and occipital lobes, which correlates with the area of infarct seen on the 10/06/2020 CT and 10/07/2020 MRI, with edema in this area. Subtle areas of hyperdensity, likely petechial hemorrhage. No evidence of hemorrhagic transformation. No new areas of infarction. No significant midline shift. No mass or extra-axial collection. Vascular: No hyperdense vessel or unexpected calcification. Skull: Normal. Negative for fracture or focal lesion. Sinuses/Orbits: Mucosal thickening in the left frontal sinus and bilateral ethmoid air cells. The orbits are unremarkable. Other: None. IMPRESSION: Expected evolution of the  previously noted right MCA/PCA territory infarct, with petechial hemorrhage and mild edema, without midline shift or hemorrhagic transformation. Electronically Signed   By: Merilyn Baba MD   On: 10/14/2020 10:02   CT HEAD WO CONTRAST (5MM)  Result Date: 10/06/2020 CLINICAL DATA:  Neuro deficit, acute, stroke suspected. Steady gait, left-sided weakness and left-sided vision loss, increased  blood pressure, headache for 3 days, history of hypertension. EXAM: CT HEAD WITHOUT CONTRAST TECHNIQUE: Contiguous axial images were obtained from the base of the skull through the vertex without intravenous contrast. COMPARISON:  No pertinent prior exams available for comparison. FINDINGS: Brain: Cerebral volume is normal for age. Region of abnormal cortical/subcortical hypodensity measuring 6.3 x 3.3 x 5.8 cm within the right parietooccipital lobes compatible with acute/early subacute infarction (right PCA vascular territory and right MCA/PCA watershed territory). No significant mass effect at this time. No evidence of hemorrhagic conversion. Chronic appearing lacunar infarct within the left lentiform nucleus. No extra-axial fluid collection. No evidence of an intracranial mass. No midline shift. Vascular: No hyperdense vessel.  Atherosclerotic calcifications. Skull: Normal. Negative for fracture or focal lesion. Sinuses/Orbits: Visualized orbits show no acute finding. Mild mucosal thickening within the frontal sinuses bilaterally. Mild-to-moderate mucosal thickening within the bilateral ethmoid air cells. These results were discussed by telephone at the time of interpretation on 10/06/2020 at 1:55 pm with provider MATTHEW TRIFAN , who verbally acknowledged these results. IMPRESSION: 6.3 x 3.3 x 5.8 cm acute/early subacute cortical and subcortical infarct within the right parietooccipital lobes (right PCA vascular territory and right MCA/PCA watershed territory). Consider MR or CT angiography for further evaluation. Chronic left basal ganglia lacunar infarct. Paranasal sinus disease at the imaged levels, as described. Electronically Signed   By: Kellie Simmering DO   On: 10/06/2020 14:13   CT Angio Neck W and/or Wo Contrast  Result Date: 10/06/2020 CLINICAL DATA:  Stroke. TIA. Assess intracranial arteries. Unsteady gait. Left-sided weakness. Left-sided visual loss. Headache. EXAM: CT ANGIOGRAPHY HEAD AND NECK  TECHNIQUE: Multidetector CT imaging of the head and neck was performed using the standard protocol during bolus administration of intravenous contrast. Multiplanar CT image reconstructions and MIPs were obtained to evaluate the vascular anatomy. Carotid stenosis measurements (when applicable) are obtained utilizing NASCET criteria, using the distal internal carotid diameter as the denominator. CONTRAST:  159m OMNIPAQUE IOHEXOL 350 MG/ML SOLN COMPARISON:  Head CT earlier same day FINDINGS: CTA NECK FINDINGS Aortic arch: Aortic atherosclerosis.  Branching pattern is normal. Right carotid system: Common carotid artery is tortuous but widely patent to the bifurcation. Carotid bifurcation is widely patent. Minimal calcified plaque at the distal ICA bulb but no stenosis. Cervical ICA widely patent. Left carotid system: Common carotid artery is tortuous but widely patent to the bifurcation. Carotid bifurcation is normal. Minimal calcified plaque at the ICA bulb but no stenosis. Cervical ICA widely patent beyond that. Vertebral arteries: Right vertebral artery origin is widely patent. Left vertebral artery origin is poorly seen because of regional venous reflux. Both vertebral arteries do appear patent through the cervical region to the foramen magnum. Skeleton: Ordinary cervical spondylosis. Other neck: No mass or lymphadenopathy. Upper chest: Upper lungs are clear. Review of the MIP images confirms the above findings CTA HEAD FINDINGS Anterior circulation: Both internal carotid arteries are patent through the skull base and siphon regions. There is ordinary siphon atherosclerotic calcification. On the left, there is no stenosis. On the right, there is 50-70% stenosis in the siphon. Supraclinoid internal carotid arteries are widely patent. The anterior and  middle cerebral vessels are patent. No large vessel occlusion, with particular attention to the right MCA. Posterior circulation: Both vertebral arteries are patent  through the foramen magnum to the basilar. No basilar stenosis. Posterior circulation branch vessels are normal. Venous sinuses: Patent and normal. Anatomic variants: None significant. Review of the MIP images confirms the above findings IMPRESSION: No intracranial large vessel occlusion identified at this time, with specific attention to the right MCA branches. Minimal atherosclerotic change at both internal carotid artery bulbs. No stenosis. 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 No stenosis on the left. 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 Electronically Signed   By: Nelson Chimes M.D.   On: 10/06/2020 15:01   MR BRAIN WO CONTRAST  Result Date: 10/14/2020 CLINICAL DATA:  Stroke, follow up.  Eval for progression of stroke. EXAM: MRI HEAD WITHOUT CONTRAST TECHNIQUE: Multiplanar, multiecho pulse sequences of the brain and surrounding structures were obtained without intravenous contrast. COMPARISON:  Head CT 10/14/2020 and MRI 10/07/2020 FINDINGS: The study is intermittently motion degraded including severe motion on the coronal T2 sequence. Brain: The moderately large infarct involving the posterior right MCA territory and PCA border zone has mildly expanded compared to the prior MRI, most notable anteriorly and superiorly. Associated petechial hemorrhage is partially confluent and has increased from the prior MRI. There is progressive cytotoxic edema with regional sulcal effacement and mass effect on the right lateral ventricle. New mildly restricted diffusion in the splenium of the corpus callosum may reflect acute wallerian degeneration. Small cortical and subcortical infarcts in the right frontal lobe (border zone distribution) have also mildly increased from the prior MRI. Small T2 hyperintensities elsewhere in the cerebral white matter bilaterally are similar to the prior MRI and are nonspecific but compatible with mild chronic small vessel ischemic disease. There is no midline  shift or extra-axial fluid collection. A chronic hemorrhagic infarct is again noted in the posterior aspect of the left lentiform nucleus/external capsule. Vascular: Major intracranial vascular flow voids are preserved. Skull and upper cervical spine: Unremarkable bone marrow signal para Sinuses/Orbits: Unremarkable orbits. Mild mucosal thickening in the paranasal sinuses. Clear mastoid air cells. Other: None. IMPRESSION: 1. Mild enlargement of the right MCA/border zone infarct since the 10/07/2020 MRI. Increased cytotoxic edema and petechial hemorrhage without midline shift. 2. Chronic small-vessel ischemia including an old hemorrhagic infarct in the left basal ganglia. Electronically Signed   By: Logan Bores M.D.   On: 10/14/2020 18:22   MR BRAIN WO CONTRAST  Result Date: 10/07/2020 CLINICAL DATA:  Neuro deficit, acute, stroke suspected. Additional history provided: Vision change and mild left-sided weakness which began 2 days ago. EXAM: MRI HEAD WITHOUT CONTRAST TECHNIQUE: Multiplanar, multiecho pulse sequences of the brain and surrounding structures were obtained without intravenous contrast. COMPARISON:  Noncontrast head CT 10/06/2020. CT angiogram head/neck 10/06/2020. FINDINGS: Brain: Cerebral volume is normal. Region of abnormal cortical/subcortical restricted diffusion within the right parietal and occipital lobes measuring 6.2 x 3.3 x 6.4 cm compatible with acute/early subacute infarction (at the junction of the right MCA and PCA vascular territories). This has not significantly changed in extent as compared to the head CT performed yesterday 10/06/2020. Mild T1 hyperintensity and SWI signal loss within the infarction territory compatible with petechial hemorrhage and possibly cortical laminar necrosis. No significant mass effect at this time. There are numerous additional small patchy cortical and subcortical acute/early subacute infarcts within the right frontal and parietal lobes (watershed  distribution). Background mild multifocal T2/FLAIR hyperintensity within the cerebral  white matter, nonspecific but compatible with chronic small vessel ischemic disease. Chronic lacunar infarct with associated chronic hemosiderin deposition within the left basal ganglia/posterior limb of left internal capsule. No evidence of an intracranial mass. No extra-axial fluid collection. No midline shift. Vascular: Maintained proximal arterial flow voids. Skull and upper cervical spine: No focal suspicious marrow lesion. Incompletely assessed cervical spondylosis. Sinuses/Orbits: Visualized orbits show no acute finding. 11 mm left maxillary sinus mucous retention cyst. Moderate bilateral ethmoid sinus mucosal thickening. Trace mucosal thickening within the left frontal sinus. IMPRESSION: 6.2 x 6.4 cm acute/early subacute cortical and subcortical infarction within the right parietal and occipital lobes (at the junction of the right MCA and PCA territories). The infarct has not significantly changed in extent as compared to the head CT of 10/06/2020. Mild petechial hemorrhage within the infarction territory. No significant mass effect at this time. Numerous additional small patchy cortical and subcortical acute/early subacute infarcts within the right frontal and parietal lobes (watershed distribution). 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 Background mild chronic small vessel ischemic changes within the cerebral white matter. Paranasal sinus disease at the imaged levels, as described. Electronically Signed   By: Kellie Simmering DO   On: 10/07/2020 07:57   EEG adult  Result Date: 10/15/2020 Lora Havens, MD     10/15/2020 10:31 AM Patient Name: KASIE BOMGARDNER MRN: [id number] Epilepsy Attending: Lora Havens Referring Physician/Provider: Dr Lesleigh Noe Date: 10/15/2020 Duration: 23.02 mins Patient history: 70 year old male with posterior right  parietal watershed infarct who presented with worsening stroke symptoms as well as left thumb rhythmic and nonsuppressible shaking movements.  EEG to evaluate for seizures. Level of alertness: Awake, asleep AEDs during EEG study: LEV Technical aspects: This EEG study was done with scalp electrodes positioned according to the 10-20 International system of electrode placement. Electrical activity was acquired at a sampling rate of '500Hz'$  and reviewed with a high frequency filter of '70Hz'$  and a low frequency filter of '1Hz'$ . EEG data were recorded continuously and digitally stored. Description: The posterior dominant rhythm consists of 8-9 Hz activity of moderate voltage (25-35 uV) seen predominantly in posterior head regions, asymmetric ( R<L) and reactive to eye opening and eye closing. Sleep was characterized by vertex waves, sleep spindles (12 to 14 Hz), maximal frontocentral region.  EEG showed continuous rhythmic 3 to 5 Hz theta-delta slowing in right hemisphere. Hyperventilation and photic stimulation were not performed.   ABNORMALITY - Continuous slow, right hemisphere - Background asymmetry, right<left IMPRESSION: This study is suggestive of cortical dysfunction in right hemisphere, likely secondary to underlying stroke.  No seizures or epileptiform discharges were seen throughout the recording. Priyanka Barbra Sarks   Overnight EEG with video  Result Date: 10/16/2020 Lora Havens, MD     10/16/2020  3:23 PM Patient Name: KENDELL BERDING MRN: [id number] Epilepsy Attending: Lora Havens Referring Physician/Provider: Dr Rosalin Hawking Duration: 10/15/2020 1639 to 10/16/2020 0230  Patient history: 70 year old male with posterior right parietal watershed infarct who presented with worsening stroke symptoms as well as left thumb rhythmic and nonsuppressible shaking movements.  EEG to evaluate for seizures.  Level of alertness: Awake, asleep  AEDs during EEG study: LEV  Technical aspects: This EEG study was done with scalp  electrodes positioned according to the 10-20 International system of electrode placement. Electrical activity was acquired at a sampling rate of '500Hz'$  and reviewed with a high frequency filter of '70Hz'$  and a low frequency  filter of '1Hz'$ . EEG data were recorded continuously and digitally stored.  Description: The posterior dominant rhythm consists of 8-9 Hz activity of moderate voltage (25-35 uV) seen predominantly in posterior head regions, asymmetric ( R<L) and reactive to eye opening and eye closing. Sleep was characterized by vertex waves, sleep spindles (12 to 14 Hz), maximal frontocentral region.  EEG showed continuous rhythmic 3 to 5 Hz theta-delta slowing in right hemisphere. Hyperventilation and photic stimulation were not performed. EEG was not interpretable after 0230 on 10/16/2020 due to significant electrode artifact.   ABNORMALITY - Continuous slow, right hemisphere - Background asymmetry, right<left  IMPRESSION: This study is suggestive of cortical dysfunction in right hemisphere, likely secondary to underlying stroke.  No seizures or epileptiform discharges were seen throughout the recording.  Lora Havens   ECHOCARDIOGRAM COMPLETE  Result Date: 10/07/2020    ECHOCARDIOGRAM REPORT   Patient Name:   MICHAEAL TYLUTKI Date of Exam: 10/07/2020 Medical Rec #:  [id number]    Height:       65.0 in Accession #:    [id number]   Weight: Date of Birth:  May 04, 1950     BSA: Patient Age:    64 years     BP:           115/58 mmHg Patient Gender: M            HR:           66 bpm. Exam Location:  Inpatient Procedure: 2D Echo, Cardiac Doppler and Color Doppler Indications:    Stroke I63.9  History:        Patient has no prior history of Echocardiogram examinations.                 Risk Factors:Hypertension and Dyslipidemia. PVD. PE.  Sonographer:    Vickie Epley RDCS Referring Phys: Justice  1. Left ventricular ejection fraction, by estimation, is 65 to 70%. The left ventricle has hyperdynamic  function. The left ventricle has no regional wall motion abnormalities. Left ventricular diastolic parameters are consistent with Grade I diastolic dysfunction (impaired relaxation).  2. Right ventricular systolic function is normal. The right ventricular size is normal. Tricuspid regurgitation signal is inadequate for assessing PA pressure.  3. The mitral valve is normal in structure. No evidence of mitral valve regurgitation. No evidence of mitral stenosis.  4. The aortic valve is tricuspid. Aortic valve regurgitation is not visualized. No aortic stenosis is present.  5. The inferior vena cava is normal in size with greater than 50% respiratory variability, suggesting right atrial pressure of 3 mmHg. FINDINGS  Left Ventricle: Left ventricular ejection fraction, by estimation, is 65 to 70%. The left ventricle has hyperdynamic function. The left ventricle has no regional wall motion abnormalities. The left ventricular internal cavity size was normal in size. There is no left ventricular hypertrophy. Left ventricular diastolic parameters are consistent with Grade I diastolic dysfunction (impaired relaxation). Right Ventricle: The right ventricular size is normal. No increase in right ventricular wall thickness. Right ventricular systolic function is normal. Tricuspid regurgitation signal is inadequate for assessing PA pressure. Left Atrium: Left atrial size was normal in size. Right Atrium: Right atrial size was normal in size. Pericardium: There is no evidence of pericardial effusion. Mitral Valve: The mitral valve is normal in structure. No evidence of mitral valve regurgitation. No evidence of mitral valve stenosis. Tricuspid Valve: The tricuspid valve is normal in structure. Tricuspid valve regurgitation is not demonstrated. Aortic Valve: The  aortic valve is tricuspid. Aortic valve regurgitation is not visualized. No aortic stenosis is present. Pulmonic Valve: The pulmonic valve was normal in structure. Pulmonic  valve regurgitation is not visualized. Aorta: The aortic root is normal in size and structure. Venous: The inferior vena cava is normal in size with greater than 50% respiratory variability, suggesting right atrial pressure of 3 mmHg. IAS/Shunts: No atrial level shunt detected by color flow Doppler.  LEFT VENTRICLE PLAX 2D LVIDd:         3.60 cm      Diastology LVIDs:         2.50 cm      LV e' medial:    6.00 cm/s LV PW:         1.00 cm      LV E/e' medial:  7.4 LV IVS:        1.10 cm      LV e' lateral:   7.62 cm/s LVOT diam:     2.20 cm      LV E/e' lateral: 5.8 LV SV:         97 LVOT Area:     3.80 cm  LV Volumes (MOD) LV vol d, MOD A2C: 98.1 ml LV vol d, MOD A4C: 108.0 ml LV vol s, MOD A2C: 39.6 ml LV vol s, MOD A4C: 40.2 ml LV SV MOD A2C:     58.5 ml LV SV MOD A4C:     108.0 ml LV SV MOD BP:      64.0 ml RIGHT VENTRICLE RV S prime:     14.80 cm/s TAPSE (M-mode): 2.3 cm LEFT ATRIUM           RIGHT ATRIUM LA diam:      3.20 cm RA Area:     14.70 cm LA Vol (A2C): 31.0 ml RA Volume:   36.60 ml LA Vol (A4C): 24.7 ml  AORTIC VALVE LVOT Vmax:   129.00 cm/s LVOT Vmean:  96.600 cm/s LVOT VTI:    0.254 m  AORTA Ao Root diam: 3.50 cm Ao Asc diam:  3.50 cm MITRAL VALVE MV Area (PHT): 2.41 cm    SHUNTS MV Decel Time: 315 msec    Systemic VTI:  0.25 m MV E velocity: 44.50 cm/s  Systemic Diam: 2.20 cm MV A velocity: 65.60 cm/s MV E/A ratio:  0.68 Loralie Champagne MD Electronically signed by Loralie Champagne MD Signature Date/Time: 10/07/2020/5:59:38 PM    Final     TTE  IMPRESSIONS:  1. Left ventricular ejection fraction, by estimation, is 65 to 70%. The  left ventricle has hyperdynamic function. The left ventricle has no  regional wall motion abnormalities. Left ventricular diastolic parameters  are consistent with Grade I diastolic  dysfunction (impaired relaxation).   2. Right ventricular systolic function is normal. The right ventricular  size is normal. Tricuspid regurgitation signal is inadequate for assessing   PA pressure.   3. The mitral valve is normal in structure. No evidence of mitral valve  regurgitation. No evidence of mitral stenosis.   4. The aortic valve is tricuspid. Aortic valve regurgitation is not  visualized. No aortic stenosis is present.   5. The inferior vena cava is normal in size with greater than 50%  respiratory variability, suggesting right atrial pressure of 3 mmHg.   EEG: IMPRESSION: This study is suggestive of cortical dysfunction in right hemisphere, likely secondary to underlying stroke.  No seizures or epileptiform discharges were seen throughout the recording.    PHYSICAL EXAM  Temp:  [98 F (36.7 C)-99.3 F (37.4 C)] 98.1 F (36.7 C) (08/13 0829) Pulse Rate:  [47-55] 47 (08/13 1227) Resp:  [16-18] 16 (08/13 1227) BP: (140-172)/(61-83) 140/62 (08/13 1227) SpO2:  [100 %] 100 % (08/13 1227)  General - Well nourished, well developed, in no apparent distress.  Ophthalmologic - fundi not visualized due to noncooperation.  Cardiovascular - Regular rhythm and rate.  Mental Status -  Level of arousal and orientation to time, place, and person were intact. Language is intact.  Cranial Nerves II - XII - II - Visual field intact OU. III, IV, VI - Extraocular movements intact. V - Facial sensation intact bilaterally. VII - Facial movement intact bilaterally. VIII - Hearing & vestibular intact bilaterally. X - Palate elevates symmetrically. XII - Tongue protrusion intact.  Motor Strength - The patient's strength was normal 5/5 right hemibody.  Left hemiparesis.  Reflexes - The patient's reflexes were symmetrical in all extremities and he had no pathological reflexes.  Sensory - Light touch intact b/l.  Coordination - no twitching noted today. Normal tone.   Gait and Station - deferred.   ASSESSMENT/PLAN [name] is a 70 y.o. 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 no tPA given due to risk of bleeding  Increased petechial hemorrhage/evolution of previous right MCA/PCA territory infarct with mild edema without midline shift or hemorrhagic transformation MRI mild enlargement with cytotoxic edema and petechial hemorrhage without midline shift compared to 8/4 2D Echo grade 1 diastolic dysfunction, EF 65 to 70% LDL 75 HgbA1c 5.9% SCDs for VTE prophylaxis Pradaxa (dabigatran) twice a day prior to admission, now on No antithrombotic.  Therapy recommendations:  pending Disposition:  pending  LUE non suppressible twitching/weakness, concern for focal seizure activity LTM not possible as leads fell off.  He is doing better with resolution of hand twitching with keppra. Continue keppra Continue to monitor.  Hypertension Stable SBP goal 130-150 Long term BP goal normotensive  Hyperlipidemia Home meds:  lipitor 80 mg  LDL 75, goal < 70 on lipitor 40 mg Continue statin at discharge  Other Stroke Risk Factors Advanced age Cigarette smoker, advised to stop smoking Obesity, Body mass index is 35.11 kg/m.  Hx stroke/TIA  Hospital day # 2   Total of 30 mins spent reviewing chart, discussion with patient  on prognosis, Dx and plan. Reviewed Imaging personally.   Egbert Garibaldi, MD   To contact Stroke Continuity provider, please refer to [email or web address] After hours, contact General Neurology

## 2020-10-16 NOTE — Procedures (Signed)
Patient Name: Clifford [name]  MRN: [id number]  Epilepsy Attending: Lora Havens  Referring Physician/Provider: Dr Rosalin Hawking Duration: 10/15/2020 1639 to 10/16/2020 0230   Patient history: 70 year old male with posterior right parietal watershed infarct who presented with worsening stroke symptoms as well as left thumb rhythmic and nonsuppressible shaking movements.  EEG to evaluate for seizures.   Level of alertness: Awake, asleep   AEDs during EEG study: LEV   Technical aspects: This EEG study was done with scalp electrodes positioned according to the 10-20 International system of electrode placement. Electrical activity was acquired at a sampling rate of '500Hz'$  and reviewed with a high frequency filter of '70Hz'$  and a low frequency filter of '1Hz'$ . EEG data were recorded continuously and digitally stored.    Description: The posterior dominant rhythm consists of 8-9 Hz activity of moderate voltage (25-35 uV) seen predominantly in posterior head regions, asymmetric ( R<L) and reactive to eye opening and eye closing. Sleep was characterized by vertex waves, sleep spindles (12 to 14 Hz), maximal frontocentral region.  EEG showed continuous rhythmic 3 to 5 Hz theta-delta slowing in right hemisphere. Hyperventilation and photic stimulation were not performed.   EEG was not interpretable after 0230 on 10/16/2020 due to significant electrode artifact.     ABNORMALITY - Continuous slow, right hemisphere - Background asymmetry, right<left   IMPRESSION: This study is suggestive of cortical dysfunction in right hemisphere, likely secondary to underlying stroke.  No seizures or epileptiform discharges were seen throughout the recording.   Jeffie Spivack Barbra Sarks

## 2020-10-16 NOTE — Plan of Care (Signed)

## 2020-10-16 NOTE — Progress Notes (Signed)
Inpatient Rehab Admissions:  Inpatient Rehab Consult received.  I met with patient at the bedside for rehabilitation assessment and to discuss goals and expectations of an inpatient rehab admission.  Pt acknowledged understanding of CIR goals and expectations. Pt interested in pursuing CIR.  Pt gave permission to contact brother, Marland Kitchen.  Spoke with Marland Kitchen on the telephone. He also acknowledged understanding and is interested in pt pursuing CIR.  He confirmed that he and his wife will be able to provide assistance to pt after hospital discharge. Will continue to follow.  Signed: Gayland Curry, [place], [place] Admissions Coordinator [phone]

## 2020-10-16 NOTE — Progress Notes (Signed)
LTM discontinued; patient had pulled all leads off his head while sleeping; no skin breakdown was seen.

## 2020-10-17 DIAGNOSIS — R531 Weakness: Secondary | ICD-10-CM | POA: Diagnosis not present

## 2020-10-17 NOTE — Progress Notes (Signed)
Patient ID: Clifford [name], male   DOB: December 01, 1950, 70 y.o.   MRN: [id number] STROKE TEAM PROGRESS NOTE     SUBJECTIVE (INTERVAL HISTORY) Per nurse, he is much better. Less confused and no further arm/hand twitching. He has no complaints today.  No seizures reported by pt or nurse.  OBJECTIVE Temp:  [98 F (36.7 C)-99.6 F (37.6 C)] 98.7 F (37.1 C) (08/14 1310) Pulse Rate:  [48-55] 53 (08/14 1310) Cardiac Rhythm: Sinus bradycardia (08/14 0800) Resp:  [16-18] 16 (08/14 1310) BP: (129-168)/(54-81) 150/79 (08/14 1310) SpO2:  [97 %-100 %] 100 % (08/14 1310)  No results for input(s): GLUCAP in the last 168 hours. Recent Labs  Lab 10/14/20 0913 10/14/20 0920 10/15/20 0500 10/16/20 0346  NA 140 137 135 134*  K 3.6 3.7 3.6 4.1  CL 104 104 102 104  CO2  --  '22 23 24  '$ GLUCOSE 118* 117* 86 104*  BUN '19 17 18 15  '$ CREATININE 1.20 1.30* 1.22 1.25*  CALCIUM  --  9.2 8.9 8.8*  MG  --   --  1.8  --    Recent Labs  Lab 10/14/20 0920 10/15/20 0500  AST 22 19  ALT 13 12  ALKPHOS 77 65  BILITOT 1.0 0.8  PROT 7.1 6.2*  ALBUMIN 3.6 3.0*   Recent Labs  Lab 10/14/20 0913 10/14/20 0920 10/15/20 0500 10/16/20 0346  WBC  --  4.3 4.3 4.4  NEUTROABS  --  2.6 1.8  --   HGB 14.3 13.5 11.8* 11.8*  HCT 42.0 42.1 37.9* 36.7*  MCV  --  81.6 83.8 81.9  PLT  --  227 193 169   No results for input(s): CKTOTAL, CKMB, CKMBINDEX, TROPONINI in the last 168 hours. No results for input(s): LABPROT, INR in the last 72 hours.  No results for input(s): COLORURINE, LABSPEC, Colon, GLUCOSEU, HGBUR, BILIRUBINUR, KETONESUR, PROTEINUR, UROBILINOGEN, NITRITE, LEUKOCYTESUR in the last 72 hours.  Invalid input(s): APPERANCEUR     Component Value Date/Time   CHOL 128 10/07/2020 0450   TRIG 63 10/07/2020 0450   HDL 40 (L) 10/07/2020 0450   CHOLHDL 3.2 10/07/2020 0450   VLDL 13 10/07/2020 0450   LDLCALC 75 10/07/2020 0450   Lab Results  Component Value Date   HGBA1C 5.9 (H) 10/07/2020   No  results found for: LABOPIA, COCAINSCRNUR, LABBENZ, AMPHETMU, THCU, LABBARB  No results for input(s): ETH in the last 168 hours.  I have personally reviewed the radiological images below and agree with the radiology interpretations.  CT Angio Head W or Wo Contrast  Result Date: 10/06/2020 CLINICAL DATA:  Stroke. TIA. Assess intracranial arteries. Unsteady gait. Left-sided weakness. Left-sided visual loss. Headache. EXAM: CT ANGIOGRAPHY HEAD AND NECK TECHNIQUE: Multidetector CT imaging of the head and neck was performed using the standard protocol during bolus administration of intravenous contrast. Multiplanar CT image reconstructions and MIPs were obtained to evaluate the vascular anatomy. Carotid stenosis measurements (when applicable) are obtained utilizing NASCET criteria, using the distal internal carotid diameter as the denominator. CONTRAST:  131m OMNIPAQUE IOHEXOL 350 MG/ML SOLN COMPARISON:  Head CT earlier same day FINDINGS: CTA NECK FINDINGS Aortic arch: Aortic atherosclerosis.  Branching pattern is normal. Right carotid system: Common carotid artery is tortuous but widely patent to the bifurcation. Carotid bifurcation is widely patent. Minimal calcified plaque at the distal ICA bulb but no stenosis. Cervical ICA widely patent. Left carotid system: Common carotid artery is tortuous but widely patent to the bifurcation. Carotid bifurcation is normal. Minimal  calcified plaque at the ICA bulb but no stenosis. Cervical ICA widely patent beyond that. Vertebral arteries: Right vertebral artery origin is widely patent. Left vertebral artery origin is poorly seen because of regional venous reflux. Both vertebral arteries do appear patent through the cervical region to the foramen magnum. Skeleton: Ordinary cervical spondylosis. Other neck: No mass or lymphadenopathy. Upper chest: Upper lungs are clear. Review of the MIP images confirms the above findings CTA HEAD FINDINGS Anterior circulation: Both internal  carotid arteries are patent through the skull base and siphon regions. There is ordinary siphon atherosclerotic calcification. On the left, there is no stenosis. On the right, there is 50-70% stenosis in the siphon. Supraclinoid internal carotid arteries are widely patent. The anterior and middle cerebral vessels are patent. No large vessel occlusion, with particular attention to the right MCA. Posterior circulation: Both vertebral arteries are patent through the foramen magnum to the basilar. No basilar stenosis. Posterior circulation branch vessels are normal. Venous sinuses: Patent and normal. Anatomic variants: None significant. Review of the MIP images confirms the above findings IMPRESSION: No intracranial large vessel occlusion identified at this time, with specific attention to the right MCA branches. Minimal atherosclerotic change at both internal carotid artery bulbs. No stenosis. 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 No stenosis on the left. 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 Electronically Signed   By: Nelson Chimes M.D.   On: 10/06/2020 15:01   CT HEAD WO CONTRAST (5MM)  Result Date: 10/15/2020 CLINICAL DATA:  Neuro deficit, acute, stroke suspected. New focal defects. EXAM: CT HEAD WITHOUT CONTRAST TECHNIQUE: Contiguous axial images were obtained from the base of the skull through the vertex without intravenous contrast. COMPARISON:  Head CT and MRI yesterday. FINDINGS: Brain: Confluent acute infarction in the right posterior temporal and temporoparietal junction regions shows discrete low-density by CT, with minimal petechial blood products but no frank hematoma. Smaller areas cortical and subcortical infarction in the right frontal and frontoparietal cortex and underlying white matter are seen by CT is subtle findings. No hemorrhage in that region. No new insult is identified. Mild swelling but no significant mass effect or any midline shift. No hydrocephalus. No  extra-axial collection. Vascular: There is atherosclerotic calcification of the major vessels at the base of the brain. Skull: Negative Sinuses/Orbits: Clear/normal Other: None IMPRESSION: No apparent change since yesterday. Confluent infarction at the right posterior temporal and temporoparietal junction region with petechial blood products but no frank hematoma. Smaller more subtle infarctions of the cortical and subcortical brain in the right frontal and frontoparietal region appear unchanged. Electronically Signed   By: Nelson Chimes M.D.   On: 10/15/2020 15:16   CT HEAD WO CONTRAST (5MM)  Result Date: 10/14/2020 CLINICAL DATA:  Right-sided headache, recent stroke EXAM: CT HEAD WITHOUT CONTRAST TECHNIQUE: Contiguous axial images were obtained from the base of the skull through the vertex without intravenous contrast. COMPARISON:  10/06/2020 CT head, 10/07/2020 MRI head FINDINGS: Brain: Area of hypodensity in the right parietal and occipital lobes, which correlates with the area of infarct seen on the 10/06/2020 CT and 10/07/2020 MRI, with edema in this area. Subtle areas of hyperdensity, likely petechial hemorrhage. No evidence of hemorrhagic transformation. No new areas of infarction. No significant midline shift. No mass or extra-axial collection. Vascular: No hyperdense vessel or unexpected calcification. Skull: Normal. Negative for fracture or focal lesion. Sinuses/Orbits: Mucosal thickening in the left frontal sinus and bilateral ethmoid air cells. The orbits are unremarkable. Other: None. IMPRESSION: Expected  evolution of the previously noted right MCA/PCA territory infarct, with petechial hemorrhage and mild edema, without midline shift or hemorrhagic transformation. Electronically Signed   By: Merilyn Baba MD   On: 10/14/2020 10:02   CT HEAD WO CONTRAST (5MM)  Result Date: 10/06/2020 CLINICAL DATA:  Neuro deficit, acute, stroke suspected. Steady gait, left-sided weakness and left-sided vision  loss, increased blood pressure, headache for 3 days, history of hypertension. EXAM: CT HEAD WITHOUT CONTRAST TECHNIQUE: Contiguous axial images were obtained from the base of the skull through the vertex without intravenous contrast. COMPARISON:  No pertinent prior exams available for comparison. FINDINGS: Brain: Cerebral volume is normal for age. Region of abnormal cortical/subcortical hypodensity measuring 6.3 x 3.3 x 5.8 cm within the right parietooccipital lobes compatible with acute/early subacute infarction (right PCA vascular territory and right MCA/PCA watershed territory). No significant mass effect at this time. No evidence of hemorrhagic conversion. Chronic appearing lacunar infarct within the left lentiform nucleus. No extra-axial fluid collection. No evidence of an intracranial mass. No midline shift. Vascular: No hyperdense vessel.  Atherosclerotic calcifications. Skull: Normal. Negative for fracture or focal lesion. Sinuses/Orbits: Visualized orbits show no acute finding. Mild mucosal thickening within the frontal sinuses bilaterally. Mild-to-moderate mucosal thickening within the bilateral ethmoid air cells. These results were discussed by telephone at the time of interpretation on 10/06/2020 at 1:55 pm with provider MATTHEW TRIFAN , who verbally acknowledged these results. IMPRESSION: 6.3 x 3.3 x 5.8 cm acute/early subacute cortical and subcortical infarct within the right parietooccipital lobes (right PCA vascular territory and right MCA/PCA watershed territory). Consider MR or CT angiography for further evaluation. Chronic left basal ganglia lacunar infarct. Paranasal sinus disease at the imaged levels, as described. Electronically Signed   By: Kellie Simmering DO   On: 10/06/2020 14:13   CT Angio Neck W and/or Wo Contrast  Result Date: 10/06/2020 CLINICAL DATA:  Stroke. TIA. Assess intracranial arteries. Unsteady gait. Left-sided weakness. Left-sided visual loss. Headache. EXAM: CT ANGIOGRAPHY HEAD  AND NECK TECHNIQUE: Multidetector CT imaging of the head and neck was performed using the standard protocol during bolus administration of intravenous contrast. Multiplanar CT image reconstructions and MIPs were obtained to evaluate the vascular anatomy. Carotid stenosis measurements (when applicable) are obtained utilizing NASCET criteria, using the distal internal carotid diameter as the denominator. CONTRAST:  154m OMNIPAQUE IOHEXOL 350 MG/ML SOLN COMPARISON:  Head CT earlier same day FINDINGS: CTA NECK FINDINGS Aortic arch: Aortic atherosclerosis.  Branching pattern is normal. Right carotid system: Common carotid artery is tortuous but widely patent to the bifurcation. Carotid bifurcation is widely patent. Minimal calcified plaque at the distal ICA bulb but no stenosis. Cervical ICA widely patent. Left carotid system: Common carotid artery is tortuous but widely patent to the bifurcation. Carotid bifurcation is normal. Minimal calcified plaque at the ICA bulb but no stenosis. Cervical ICA widely patent beyond that. Vertebral arteries: Right vertebral artery origin is widely patent. Left vertebral artery origin is poorly seen because of regional venous reflux. Both vertebral arteries do appear patent through the cervical region to the foramen magnum. Skeleton: Ordinary cervical spondylosis. Other neck: No mass or lymphadenopathy. Upper chest: Upper lungs are clear. Review of the MIP images confirms the above findings CTA HEAD FINDINGS Anterior circulation: Both internal carotid arteries are patent through the skull base and siphon regions. There is ordinary siphon atherosclerotic calcification. On the left, there is no stenosis. On the right, there is 50-70% stenosis in the siphon. Supraclinoid internal carotid arteries are widely patent.  The anterior and middle cerebral vessels are patent. No large vessel occlusion, with particular attention to the right MCA. Posterior circulation: Both vertebral arteries are  patent through the foramen magnum to the basilar. No basilar stenosis. Posterior circulation branch vessels are normal. Venous sinuses: Patent and normal. Anatomic variants: None significant. Review of the MIP images confirms the above findings IMPRESSION: No intracranial large vessel occlusion identified at this time, with specific attention to the right MCA branches. Minimal atherosclerotic change at both internal carotid artery bulbs. No stenosis. 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 No stenosis on the left. 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 Electronically Signed   By: Nelson Chimes M.D.   On: 10/06/2020 15:01   MR BRAIN WO CONTRAST  Result Date: 10/14/2020 CLINICAL DATA:  Stroke, follow up.  Eval for progression of stroke. EXAM: MRI HEAD WITHOUT CONTRAST TECHNIQUE: Multiplanar, multiecho pulse sequences of the brain and surrounding structures were obtained without intravenous contrast. COMPARISON:  Head CT 10/14/2020 and MRI 10/07/2020 FINDINGS: The study is intermittently motion degraded including severe motion on the coronal T2 sequence. Brain: The moderately large infarct involving the posterior right MCA territory and PCA border zone has mildly expanded compared to the prior MRI, most notable anteriorly and superiorly. Associated petechial hemorrhage is partially confluent and has increased from the prior MRI. There is progressive cytotoxic edema with regional sulcal effacement and mass effect on the right lateral ventricle. New mildly restricted diffusion in the splenium of the corpus callosum may reflect acute wallerian degeneration. Small cortical and subcortical infarcts in the right frontal lobe (border zone distribution) have also mildly increased from the prior MRI. Small T2 hyperintensities elsewhere in the cerebral white matter bilaterally are similar to the prior MRI and are nonspecific but compatible with mild chronic small vessel ischemic disease. There is no  midline shift or extra-axial fluid collection. A chronic hemorrhagic infarct is again noted in the posterior aspect of the left lentiform nucleus/external capsule. Vascular: Major intracranial vascular flow voids are preserved. Skull and upper cervical spine: Unremarkable bone marrow signal para Sinuses/Orbits: Unremarkable orbits. Mild mucosal thickening in the paranasal sinuses. Clear mastoid air cells. Other: None. IMPRESSION: 1. Mild enlargement of the right MCA/border zone infarct since the 10/07/2020 MRI. Increased cytotoxic edema and petechial hemorrhage without midline shift. 2. Chronic small-vessel ischemia including an old hemorrhagic infarct in the left basal ganglia. Electronically Signed   By: Logan Bores M.D.   On: 10/14/2020 18:22   MR BRAIN WO CONTRAST  Result Date: 10/07/2020 CLINICAL DATA:  Neuro deficit, acute, stroke suspected. Additional history provided: Vision change and mild left-sided weakness which began 2 days ago. EXAM: MRI HEAD WITHOUT CONTRAST TECHNIQUE: Multiplanar, multiecho pulse sequences of the brain and surrounding structures were obtained without intravenous contrast. COMPARISON:  Noncontrast head CT 10/06/2020. CT angiogram head/neck 10/06/2020. FINDINGS: Brain: Cerebral volume is normal. Region of abnormal cortical/subcortical restricted diffusion within the right parietal and occipital lobes measuring 6.2 x 3.3 x 6.4 cm compatible with acute/early subacute infarction (at the junction of the right MCA and PCA vascular territories). This has not significantly changed in extent as compared to the head CT performed yesterday 10/06/2020. Mild T1 hyperintensity and SWI signal loss within the infarction territory compatible with petechial hemorrhage and possibly cortical laminar necrosis. No significant mass effect at this time. There are numerous additional small patchy cortical and subcortical acute/early subacute infarcts within the right frontal and parietal lobes (watershed  distribution). Background mild multifocal T2/FLAIR hyperintensity  within the cerebral white matter, nonspecific but compatible with chronic small vessel ischemic disease. Chronic lacunar infarct with associated chronic hemosiderin deposition within the left basal ganglia/posterior limb of left internal capsule. No evidence of an intracranial mass. No extra-axial fluid collection. No midline shift. Vascular: Maintained proximal arterial flow voids. Skull and upper cervical spine: No focal suspicious marrow lesion. Incompletely assessed cervical spondylosis. Sinuses/Orbits: Visualized orbits show no acute finding. 11 mm left maxillary sinus mucous retention cyst. Moderate bilateral ethmoid sinus mucosal thickening. Trace mucosal thickening within the left frontal sinus. IMPRESSION: 6.2 x 6.4 cm acute/early subacute cortical and subcortical infarction within the right parietal and occipital lobes (at the junction of the right MCA and PCA territories). The infarct has not significantly changed in extent as compared to the head CT of 10/06/2020. Mild petechial hemorrhage within the infarction territory. No significant mass effect at this time. Numerous additional small patchy cortical and subcortical acute/early subacute infarcts within the right frontal and parietal lobes (watershed distribution). 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 Background mild chronic small vessel ischemic changes within the cerebral white matter. Paranasal sinus disease at the imaged levels, as described. Electronically Signed   By: Kellie Simmering DO   On: 10/07/2020 07:57   EEG adult  Result Date: 10/15/2020 Lora Havens, MD     10/15/2020 10:31 AM Patient Name: Clifford [name] MRN: [id number] Epilepsy Attending: Lora Havens Referring Physician/Provider: Dr Lesleigh Noe Date: 10/15/2020 Duration: 23.02 mins Patient history: 70 year old male with posterior right  parietal watershed infarct who presented with worsening stroke symptoms as well as left thumb rhythmic and nonsuppressible shaking movements.  EEG to evaluate for seizures. Level of alertness: Awake, asleep AEDs during EEG study: LEV Technical aspects: This EEG study was done with scalp electrodes positioned according to the 10-20 International system of electrode placement. Electrical activity was acquired at a sampling rate of '500Hz'$  and reviewed with a high frequency filter of '70Hz'$  and a low frequency filter of '1Hz'$ . EEG data were recorded continuously and digitally stored. Description: The posterior dominant rhythm consists of 8-9 Hz activity of moderate voltage (25-35 uV) seen predominantly in posterior head regions, asymmetric ( R<L) and reactive to eye opening and eye closing. Sleep was characterized by vertex waves, sleep spindles (12 to 14 Hz), maximal frontocentral region.  EEG showed continuous rhythmic 3 to 5 Hz theta-delta slowing in right hemisphere. Hyperventilation and photic stimulation were not performed.   ABNORMALITY - Continuous slow, right hemisphere - Background asymmetry, right<left IMPRESSION: This study is suggestive of cortical dysfunction in right hemisphere, likely secondary to underlying stroke.  No seizures or epileptiform discharges were seen throughout the recording. Priyanka Barbra Sarks   Overnight EEG with video  Result Date: 10/16/2020 Lora Havens, MD     10/16/2020  3:23 PM Patient Name: Clifford [name] MRN: [id number] Epilepsy Attending: Lora Havens Referring Physician/Provider: Dr Rosalin Hawking Duration: 10/15/2020 1639 to 10/16/2020 0230  Patient history: 70 year old male with posterior right parietal watershed infarct who presented with worsening stroke symptoms as well as left thumb rhythmic and nonsuppressible shaking movements.  EEG to evaluate for seizures.  Level of alertness: Awake, asleep  AEDs during EEG study: LEV  Technical aspects: This EEG study was done with scalp  electrodes positioned according to the 10-20 International system of electrode placement. Electrical activity was acquired at a sampling rate of '500Hz'$  and reviewed with a high frequency filter of '70Hz'$  and  a low frequency filter of '1Hz'$ . EEG data were recorded continuously and digitally stored.  Description: The posterior dominant rhythm consists of 8-9 Hz activity of moderate voltage (25-35 uV) seen predominantly in posterior head regions, asymmetric ( R<L) and reactive to eye opening and eye closing. Sleep was characterized by vertex waves, sleep spindles (12 to 14 Hz), maximal frontocentral region.  EEG showed continuous rhythmic 3 to 5 Hz theta-delta slowing in right hemisphere. Hyperventilation and photic stimulation were not performed. EEG was not interpretable after 0230 on 10/16/2020 due to significant electrode artifact.   ABNORMALITY - Continuous slow, right hemisphere - Background asymmetry, right<left  IMPRESSION: This study is suggestive of cortical dysfunction in right hemisphere, likely secondary to underlying stroke.  No seizures or epileptiform discharges were seen throughout the recording.  Lora Havens   ECHOCARDIOGRAM COMPLETE  Result Date: 10/07/2020    ECHOCARDIOGRAM REPORT   Patient Name:   Clifford [name] Date of Exam: 10/07/2020 Medical Rec #:  [id number]    Height:       65.0 in Accession #:    [id number]   Weight: Date of Birth:  1950-08-27     BSA: Patient Age:    59 years     BP:           115/58 mmHg Patient Gender: M            HR:           66 bpm. Exam Location:  Inpatient Procedure: 2D Echo, Cardiac Doppler and Color Doppler Indications:    Stroke I63.9  History:        Patient has no prior history of Echocardiogram examinations.                 Risk Factors:Hypertension and Dyslipidemia. PVD. PE.  Sonographer:    Vickie Epley RDCS Referring Phys: [place]  1. Left ventricular ejection fraction, by estimation, is 65 to 70%. The left ventricle has hyperdynamic  function. The left ventricle has no regional wall motion abnormalities. Left ventricular diastolic parameters are consistent with Grade I diastolic dysfunction (impaired relaxation).  2. Right ventricular systolic function is normal. The right ventricular size is normal. Tricuspid regurgitation signal is inadequate for assessing PA pressure.  3. The mitral valve is normal in structure. No evidence of mitral valve regurgitation. No evidence of mitral stenosis.  4. The aortic valve is tricuspid. Aortic valve regurgitation is not visualized. No aortic stenosis is present.  5. The inferior vena cava is normal in size with greater than 50% respiratory variability, suggesting right atrial pressure of 3 mmHg. FINDINGS  Left Ventricle: Left ventricular ejection fraction, by estimation, is 65 to 70%. The left ventricle has hyperdynamic function. The left ventricle has no regional wall motion abnormalities. The left ventricular internal cavity size was normal in size. There is no left ventricular hypertrophy. Left ventricular diastolic parameters are consistent with Grade I diastolic dysfunction (impaired relaxation). Right Ventricle: The right ventricular size is normal. No increase in right ventricular wall thickness. Right ventricular systolic function is normal. Tricuspid regurgitation signal is inadequate for assessing PA pressure. Left Atrium: Left atrial size was normal in size. Right Atrium: Right atrial size was normal in size. Pericardium: There is no evidence of pericardial effusion. Mitral Valve: The mitral valve is normal in structure. No evidence of mitral valve regurgitation. No evidence of mitral valve stenosis. Tricuspid Valve: The tricuspid valve is normal in structure. Tricuspid valve regurgitation is not demonstrated.  Aortic Valve: The aortic valve is tricuspid. Aortic valve regurgitation is not visualized. No aortic stenosis is present. Pulmonic Valve: The pulmonic valve was normal in structure. Pulmonic  valve regurgitation is not visualized. Aorta: The aortic root is normal in size and structure. Venous: The inferior vena cava is normal in size with greater than 50% respiratory variability, suggesting right atrial pressure of 3 mmHg. IAS/Shunts: No atrial level shunt detected by color flow Doppler.  LEFT VENTRICLE PLAX 2D LVIDd:         3.60 cm      Diastology LVIDs:         2.50 cm      LV e' medial:    6.00 cm/s LV PW:         1.00 cm      LV E/e' medial:  7.4 LV IVS:        1.10 cm      LV e' lateral:   7.62 cm/s LVOT diam:     2.20 cm      LV E/e' lateral: 5.8 LV SV:         97 LVOT Area:     3.80 cm  LV Volumes (MOD) LV vol d, MOD A2C: 98.1 ml LV vol d, MOD A4C: 108.0 ml LV vol s, MOD A2C: 39.6 ml LV vol s, MOD A4C: 40.2 ml LV SV MOD A2C:     58.5 ml LV SV MOD A4C:     108.0 ml LV SV MOD BP:      64.0 ml RIGHT VENTRICLE RV S prime:     14.80 cm/s TAPSE (M-mode): 2.3 cm LEFT ATRIUM           RIGHT ATRIUM LA diam:      3.20 cm RA Area:     14.70 cm LA Vol (A2C): 31.0 ml RA Volume:   36.60 ml LA Vol (A4C): 24.7 ml  AORTIC VALVE LVOT Vmax:   129.00 cm/s LVOT Vmean:  96.600 cm/s LVOT VTI:    0.254 m  AORTA Ao Root diam: 3.50 cm Ao Asc diam:  3.50 cm MITRAL VALVE MV Area (PHT): 2.41 cm    SHUNTS MV Decel Time: 315 msec    Systemic VTI:  0.25 m MV E velocity: 44.50 cm/s  Systemic Diam: 2.20 cm MV A velocity: 65.60 cm/s MV E/A ratio:  0.68 Loralie Champagne MD Electronically signed by Loralie Champagne MD Signature Date/Time: 10/07/2020/5:59:38 PM    Final     TTE  IMPRESSIONS:  1. Left ventricular ejection fraction, by estimation, is 65 to 70%. The  left ventricle has hyperdynamic function. The left ventricle has no  regional wall motion abnormalities. Left ventricular diastolic parameters  are consistent with Grade I diastolic  dysfunction (impaired relaxation).   2. Right ventricular systolic function is normal. The right ventricular  size is normal. Tricuspid regurgitation signal is inadequate for assessing   PA pressure.   3. The mitral valve is normal in structure. No evidence of mitral valve  regurgitation. No evidence of mitral stenosis.   4. The aortic valve is tricuspid. Aortic valve regurgitation is not  visualized. No aortic stenosis is present.   5. The inferior vena cava is normal in size with greater than 50%  respiratory variability, suggesting right atrial pressure of 3 mmHg.   EEG: IMPRESSION: This study is suggestive of cortical dysfunction in right hemisphere, likely secondary to underlying stroke.  No seizures or epileptiform discharges were seen throughout the recording.  PHYSICAL EXAM  Temp:  [98 F (36.7 C)-99.6 F (37.6 C)] 98.7 F (37.1 C) (08/14 1310) Pulse Rate:  [48-55] 53 (08/14 1310) Resp:  [16-18] 16 (08/14 1310) BP: (129-168)/(54-81) 150/79 (08/14 1310) SpO2:  [97 %-100 %] 100 % (08/14 1310)  General - Well nourished, well developed, in no apparent distress.  Ophthalmologic - fundi not visualized due to noncooperation.  Cardiovascular - Regular rhythm and rate.  Mental Status -  Level of arousal and orientation to time, place, and person were intact. Language is intact.  Cranial Nerves II - XII - II - Visual field intact OU. III, IV, VI - Extraocular movements intact. V - Facial sensation intact bilaterally. VII - Facial movement intact bilaterally. VIII - Hearing & vestibular intact bilaterally. X - Palate elevates symmetrically. XII - Tongue protrusion intact.  Motor Strength - The patient's strength was normal 5/5 right hemibody.  Left hemiparesis.  Reflexes - The patient's reflexes were symmetrical in all extremities and he had no pathological reflexes.  Sensory - Light touch intact b/l.  Coordination - no twitching noted today. Normal tone.   Gait and Station - deferred.   ASSESSMENT/PLAN Clifford [name] is a 70 y.o. male with history of diastolic CHF (EF 65 to [id number]), hypertension, hyperlipidemia, multiple pulmonary emboli on  lifelong anticoagulation (on Pradaxa at home), recent discharge from the hospital (8/3) for right MCA/PCA watershed ischemic stroke, re-admitted for worsening left upper extremity weakness and concern for focal motor seizure of left upper extremity in setting of worsening petechial cerebral hemorrhage. no tPA given due to risk of bleeding  Increased petechial hemorrhage/evolution of previous right MCA/PCA territory infarct with mild edema without midline shift or hemorrhagic transformation MRI mild enlargement with cytotoxic edema and petechial hemorrhage without midline shift compared to 8/4 2D Echo grade 1 diastolic dysfunction, EF 65 to 70% LDL 75 HgbA1c 5.9% SCDs for VTE prophylaxis Pradaxa (dabigatran) twice a day prior to admission, now on No antithrombotic.  Therapy recommendations:  pending Disposition:  pending  LUE non suppressible twitching/weakness, concern for focal seizure activity LTM not possible as leads fell off.  He is doing better with resolution of hand twitching with keppra. Continue keppra. Since he is stable, no need for additional AED. Continue to monitor.  Hypertension Stable SBP goal 130-150 Long term BP goal normotensive  Hyperlipidemia Home meds:  lipitor 80 mg  LDL 75, goal < 70 on lipitor 40 mg Continue statin at discharge  Other Stroke Risk Factors Advanced age Cigarette smoker, advised to stop smoking Obesity, Body mass index is 35.11 kg/m.  Hx stroke/TIA  Hospital day # 3   Total of 30 mins spent reviewing chart, discussion with patient  on prognosis, Dx and plan. Reviewed Imaging personally.   Egbert Garibaldi, MD   To contact Stroke Continuity provider, please refer to [email or web address] After hours, contact General Neurology

## 2020-10-17 NOTE — Evaluation (Signed)
Speech Language Pathology Evaluation Patient Details Name: GURNIE SURGENER MRN: [id number] DOB: 18-Sep-1950 Today's Date: 10/17/2020 Time: [id number] SLP Time Calculation (min) (ACUTE ONLY): 15 min  Problem List:  Patient Active Problem List   Diagnosis Date Noted   Left-sided weakness 10/14/2020   Prediabetes 10/14/2020   History of pulmonary embolism 10/14/2020   Chronic anticoagulation 10/14/2020   Nicotine dependence, cigarettes, uncomplicated 0000000   Petechial hemorrhage 10/14/2020   Acute stroke due to ischemia ([place]) 10/14/2020   Stroke ([place]) 10/06/2020   Essential hypertension 10/06/2020   Mixed hyperlipidemia 10/06/2020   Past Medical History:  Past Medical History:  Diagnosis Date   BPH (benign prostatic hyperplasia)    Chronic anticoagulation 10/14/2020   Essential hypertension 10/06/2020   History of pulmonary embolism 10/14/2020   HLD (hyperlipidemia)    HTN (hypertension)    Lung nodule    17m in 2015   [place]    Mixed hyperlipidemia 10/06/2020   Nicotine dependence, cigarettes, uncomplicated [id number]  Peripheral vascular disease ([place]    Polyp of colon    Prediabetes 10/14/2020   [place]    Stroke [place] 10/06/2020   Past Surgical History:  Past Surgical History:  Procedure Laterality Date   COLONOSCOPY  03/2017   PARTIAL NEPHRECTOMY Left 02/2018   PROSTATE BIOPSY  2015   HPI:  70y/o male presented to ED on 8/11 for R sided headache and worsening L UE weakness. Patient recently admitted 8/3-8/5 for R CVA with L visual deficits and L sided weakness. MRI with mild enlargement of R MCA/border zone infarct since 10/07/20 MRI and increased cytotoxic edema and petechial hemorrhage without midline shift. 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   Assessment / Plan / Recommendation Clinical Impression  Pt was seen for a cognitive-linguistic evaluation in the setting of a R CVA.  Pt was alert and cooperative  throughout this evaluation.  He reported that he lives with his son and daughter-in-law, and that he is generally independent with IADLs at baseline including medication and financial management.  No family present to establish a cognitive baseline.  Pt completed portions of the [place] addition to informal evaluation measures.  He exhibited cognitive deficits in the areas of short-term memory, sustained attention, thought organization, visuospatial skills, and numeric problem solving.  Pt was also noted to have significant left neglect.  Expressive and receptive language were functional at the conversation level and no dysarthria was noted.  Recommend continued ST targeting cognitive-linguistic deficits at next level of care and supervision/assistance with IADLs.    SLP Assessment  SLP Recommendation/Assessment: Patient needs continued Speech Lanaguage Pathology Services SLP Visit Diagnosis: Cognitive communication deficit (R41.841)    Follow Up Recommendations  Inpatient Rehab    Frequency and Duration min 2x/week  2 weeks      SLP Evaluation Cognition  Overall Cognitive Status: No family/caregiver present to determine baseline cognitive functioning Arousal/Alertness: Awake/alert Orientation Level: Oriented X4 Attention: Sustained Sustained Attention: Impaired Sustained Attention Impairment: Verbal basic Memory: Impaired Memory Impairment: Decreased short term memory Decreased Short Term Memory: Verbal basic Awareness: Impaired Awareness Impairment: Intellectual impairment Problem Solving: Impaired Problem Solving Impairment: Verbal basic;Verbal complex Safety/Judgment: Appears intact       Comprehension  Auditory Comprehension Overall Auditory Comprehension: Appears within functional limits for tasks assessed    Expression Expression Primary Mode of Expression: Verbal Verbal Expression Overall Verbal Expression: Appears within functional limits for tasks assessed  Oral / Motor  Motor Speech Overall Motor Speech: Appears within functional limits for tasks assessed   GO                   Colin Mulders., M.S., [place] Office: [phone]  [place] 10/17/2020, 1:58 PM

## 2020-10-17 NOTE — Progress Notes (Signed)
This nurse spent 45 min with brother at bedside doing stroke education. Had a question/answer session and did go over all risk factors with him and ways to decrease risks by changing habits.

## 2020-10-17 NOTE — Progress Notes (Addendum)
PROGRESS NOTE    Clifford [name]  [id number] DOB: 11/17/1950 DOA: 10/14/2020 PCP: Pcp, No   Chief Complain:AMS  Brief Narrative:  Patient is a 70 year old male with history of diastolic congestive heart failure, hypertension, hyperlipidemia, PE on anticoagulation, recent right MCA/PCA watershed ischemic stroke who presented with increasing left upper arm weakness, left visual defect.  MRI done on presentation showed evolving/increased size of stroke, petechial hemorrhages, cytotoxic edema, concern for new seizures.  Neurology was consulted and following.  PT/OT recommending CIR on discharge.  Assessment & Plan:   Principal Problem:   Left-sided weakness Active Problems:   Stroke [facility])   Essential hypertension   Mixed hyperlipidemia   Prediabetes   History of pulmonary embolism   Chronic anticoagulation   Nicotine dependence, cigarettes, uncomplicated   Petechial hemorrhage   Acute stroke due to ischemia [facility])   Left-sided weakness: Secondary to worsening right parietal occipital MCA/PCA watershed stroke complicated by worsening petechial hemorrhage.  MRI of the brain reviewed.  Pradaxa on hold.  Repeat CT head on 8/12 did not show any apparent change.  Neurology following.  PT/OT recommended CIR on discharge.  Concern for seizure: Likely precipitated in the setting of worsening stroke with petechial hemorrhage.  EEG showed cortical dysfunction , no epileptiform discharges.  Currently on Keppra.  Hypertension: Mildly hypertensive.  Continue monitoring blood pressure.   On hydrochlorothiazide and lisinopril at home.  Currently on hold  Sinus bradycardia: Asymptomatic.  Continue to monitor.  Hyperlipidemia: On Lipitor.  Recent LDL of 75.  Prediabetes: Monitor blood sugars.  Carbohydrate consistent diet.  History of multiple pulmonary embolism: On lifelong anticoagulation.  Takes Pradaxa at home which is on hold due to finding of petechial hemorrhage.  We will follow-up with  neurology about resumption of Pradaxa.  Tobacco abuse: Counseled cessation.  Declined nicotine patch  Morbid obesity: BMI of 35.1.  We recommend weight loss, healthy lifestyle, PCP follow-up           DVT prophylaxis:SCD Code Status: Full Family Communication: None at bedside Status is: Inpatient  Remains inpatient appropriate because:Unsafe d/c plan  Dispo: The patient is from: Home              Anticipated d/c is to: CIR              Patient currently is not medically stable to d/c.   Difficult to place patient No     Consultants: neurology  Procedures:EEG  Antimicrobials:  Anti-infectives (From admission, onward)    None       Subjective:  Patient seen and examined the bedside this morning.  Hemodynamically stable.  Lying in bed.  Weak on the left side but denies any weakness or any other complaints.  Interested to go to the rehab.   Objective: Vitals:   10/16/20 2340 10/17/20 0339 10/17/20 0718 10/17/20 0736  BP: (!) 168/73 (!) 157/81 (!) 153/70   Pulse: (!) 50 (!) 51 (!) 48 (!) 50  Resp: '17 17 18   '$ Temp: 98.2 F (36.8 C) 98 F (36.7 C) 99.6 F (37.6 C)   TempSrc: Oral  Oral   SpO2: 100% 100% 100%   Weight:      Height:        Intake/Output Summary (Last 24 hours) at 10/17/2020 0741 Last data filed at 10/17/2020 0500 Gross per 24 hour  Intake --  Output 1675 ml  Net -1675 ml   Filed Weights   10/14/20 0840  Weight: 95.7 kg  Examination:  General exam: Overall comfortable, not in distress,obese HEENT: PERRL Respiratory system:  no wheezes or crackles  Cardiovascular system: S1 & S2 heard, RRR.  Gastrointestinal system: Abdomen is nondistended, soft and nontender. Central nervous system: Alert and oriented,left hemiparesis Extremities: No edema, no clubbing ,no cyanosis Skin: No rashes, no ulcers,no icterus      Data Reviewed: I have personally reviewed following labs and imaging studies  CBC: Recent Labs  Lab 10/14/20 0913  10/14/20 0920 10/15/20 0500 10/16/20 0346  WBC  --  4.3 4.3 4.4  NEUTROABS  --  2.6 1.8  --   HGB 14.3 13.5 11.8* 11.8*  HCT 42.0 42.1 37.9* 36.7*  MCV  --  81.6 83.8 81.9  PLT  --  227 193 [id number]   Basic Metabolic Panel: Recent Labs  Lab 10/14/20 0913 10/14/20 0920 10/15/20 0500 10/16/20 0346  NA 140 137 135 134*  K 3.6 3.7 3.6 4.1  CL 104 104 102 104  CO2  --  '22 23 24  '$ GLUCOSE 118* 117* 86 104*  BUN '19 17 18 15  '$ CREATININE 1.20 1.30* 1.22 1.25*  CALCIUM  --  9.2 8.9 8.8*  MG  --   --  1.8  --    GFR: Estimated Creatinine Clearance: 57.7 mL/min (A) (by C-G formula based on SCr of 1.25 mg/dL (H)). Liver Function Tests: Recent Labs  Lab 10/14/20 0920 10/15/20 0500  AST 22 19  ALT 13 12  ALKPHOS 77 65  BILITOT 1.0 0.8  PROT 7.1 6.2*  ALBUMIN 3.6 3.0*   No results for input(s): LIPASE, AMYLASE in the last 168 hours. No results for input(s): AMMONIA in the last 168 hours. Coagulation Profile: Recent Labs  Lab 10/14/20 0920  INR 1.4*   Cardiac Enzymes: No results for input(s): CKTOTAL, CKMB, CKMBINDEX, TROPONINI in the last 168 hours. BNP (last 3 results) No results for input(s): PROBNP in the last 8760 hours. HbA1C: No results for input(s): HGBA1C in the last 72 hours. CBG: No results for input(s): GLUCAP in the last 168 hours. Lipid Profile: No results for input(s): CHOL, HDL, LDLCALC, TRIG, CHOLHDL, LDLDIRECT in the last 72 hours. Thyroid Function Tests: No results for input(s): TSH, T4TOTAL, FREET4, T3FREE, THYROIDAB in the last 72 hours. Anemia Panel: No results for input(s): VITAMINB12, FOLATE, FERRITIN, TIBC, IRON, RETICCTPCT in the last 72 hours. Sepsis Labs: No results for input(s): PROCALCITON, LATICACIDVEN in the last 168 hours.  Recent Results (from the past 240 hour(s))  Resp Panel by RT-PCR (Flu A&B, Covid) Nasopharyngeal Swab     Status: None   Collection Time: 10/14/20  7:06 PM   Specimen: Nasopharyngeal Swab; Nasopharyngeal(NP) swabs in  vial transport medium  Result Value Ref Range Status   SARS Coronavirus 2 by RT PCR NEGATIVE NEGATIVE Final    Comment: (NOTE) SARS-CoV-2 target nucleic acids are NOT DETECTED.  The SARS-CoV-2 RNA is generally detectable in upper respiratory specimens during the acute phase of infection. The lowest concentration of SARS-CoV-2 viral copies this assay can detect is 138 copies/mL. A negative result does not preclude SARS-Cov-2 infection and should not be used as the sole basis for treatment or other patient management decisions. A negative result may occur with  improper specimen collection/handling, submission of specimen other than nasopharyngeal swab, presence of viral mutation(s) within the areas targeted by this assay, and inadequate number of viral copies(<138 copies/mL). A negative result must be combined with clinical observations, patient history, and epidemiological information. The expected result is Negative.  Fact Sheet for Patients:  EntrepreneurPulse.com.au  Fact Sheet for Healthcare Providers:  IncredibleEmployment.be  This test is no t yet approved or cleared by the [place] FDA and  has been authorized for detection and/or diagnosis of SARS-CoV-2 by FDA under an Emergency Use Authorization (EUA). This EUA will remain  in effect (meaning this test can be used) for the duration of the COVID-19 declaration under Section 564(b)(1) of the Act, 21 U.S.C.section 360bbb-3(b)(1), unless the authorization is terminated  or revoked sooner.       Influenza A by PCR NEGATIVE NEGATIVE Final   Influenza B by PCR NEGATIVE NEGATIVE Final    Comment: (NOTE) The Xpert Xpress SARS-CoV-2/FLU/RSV plus assay is intended as an aid in the diagnosis of influenza from Nasopharyngeal swab specimens and should not be used as a sole basis for treatment. Nasal washings and aspirates are unacceptable for Xpert Xpress SARS-CoV-2/FLU/RSV testing.  Fact  Sheet for Patients: EntrepreneurPulse.com.au  Fact Sheet for Healthcare Providers: IncredibleEmployment.be  This test is not yet approved or cleared by the [place] FDA and has been authorized for detection and/or diagnosis of SARS-CoV-2 by FDA under an Emergency Use Authorization (EUA). This EUA will remain in effect (meaning this test can be used) for the duration of the COVID-19 declaration under Section 564(b)(1) of the Act, 21 U.S.C. section 360bbb-3(b)(1), unless the authorization is terminated or revoked.  Performed at [facility], [place] [address], [place], [place] [address]          Radiology Studies: CT HEAD WO CONTRAST (5MM)  Result Date: 10/15/2020 CLINICAL DATA:  Neuro deficit, acute, stroke suspected. New focal defects. EXAM: CT HEAD WITHOUT CONTRAST TECHNIQUE: Contiguous axial images were obtained from the base of the skull through the vertex without intravenous contrast. COMPARISON:  Head CT and MRI yesterday. FINDINGS: Brain: Confluent acute infarction in the right posterior temporal and temporoparietal junction regions shows discrete low-density by CT, with minimal petechial blood products but no frank hematoma. Smaller areas cortical and subcortical infarction in the right frontal and frontoparietal cortex and underlying white matter are seen by CT is subtle findings. No hemorrhage in that region. No new insult is identified. Mild swelling but no significant mass effect or any midline shift. No hydrocephalus. No extra-axial collection. Vascular: There is atherosclerotic calcification of the major vessels at the base of the brain. Skull: Negative Sinuses/Orbits: Clear/normal Other: None IMPRESSION: No apparent change since yesterday. Confluent infarction at the right posterior temporal and temporoparietal junction region with petechial blood products but no frank hematoma. Smaller more subtle infarctions of the cortical and  subcortical brain in the right frontal and frontoparietal region appear unchanged. Electronically Signed   By: Nelson Chimes M.D.   On: 10/15/2020 15:16   EEG adult  Result Date: 10/15/2020 Lora Havens, MD     10/15/2020 10:31 AM Patient Name: Clifford [name] MRN: [id number] Epilepsy Attending: Lora Havens Referring Physician/Provider: Dr Lesleigh Noe Date: 10/15/2020 Duration: 23.02 mins Patient history: 70 year old male with posterior right parietal watershed infarct who presented with worsening stroke symptoms as well as left thumb rhythmic and nonsuppressible shaking movements.  EEG to evaluate for seizures. Level of alertness: Awake, asleep AEDs during EEG study: LEV Technical aspects: This EEG study was done with scalp electrodes positioned according to the 10-20 International system of electrode placement. Electrical activity was acquired at a sampling rate of '500Hz'$  and reviewed with a high frequency filter of '70Hz'$  and a low frequency filter of '1Hz'$ . EEG  data were recorded continuously and digitally stored. Description: The posterior dominant rhythm consists of 8-9 Hz activity of moderate voltage (25-35 uV) seen predominantly in posterior head regions, asymmetric ( R<L) and reactive to eye opening and eye closing. Sleep was characterized by vertex waves, sleep spindles (12 to 14 Hz), maximal frontocentral region.  EEG showed continuous rhythmic 3 to 5 Hz theta-delta slowing in right hemisphere. Hyperventilation and photic stimulation were not performed.   ABNORMALITY - Continuous slow, right hemisphere - Background asymmetry, right<left IMPRESSION: This study is suggestive of cortical dysfunction in right hemisphere, likely secondary to underlying stroke.  No seizures or epileptiform discharges were seen throughout the recording. Priyanka Barbra Sarks   Overnight EEG with video  Result Date: 10/16/2020 Lora Havens, MD     10/16/2020  3:23 PM Patient Name: Clifford [name] MRN: [id number] Epilepsy  Attending: Lora Havens Referring Physician/Provider: Dr Rosalin Hawking Duration: 10/15/2020 1639 to 10/16/2020 0230  Patient history: 70 year old male with posterior right parietal watershed infarct who presented with worsening stroke symptoms as well as left thumb rhythmic and nonsuppressible shaking movements.  EEG to evaluate for seizures.  Level of alertness: Awake, asleep  AEDs during EEG study: LEV  Technical aspects: This EEG study was done with scalp electrodes positioned according to the 10-20 International system of electrode placement. Electrical activity was acquired at a sampling rate of '500Hz'$  and reviewed with a high frequency filter of '70Hz'$  and a low frequency filter of '1Hz'$ . EEG data were recorded continuously and digitally stored.  Description: The posterior dominant rhythm consists of 8-9 Hz activity of moderate voltage (25-35 uV) seen predominantly in posterior head regions, asymmetric ( R<L) and reactive to eye opening and eye closing. Sleep was characterized by vertex waves, sleep spindles (12 to 14 Hz), maximal frontocentral region.  EEG showed continuous rhythmic 3 to 5 Hz theta-delta slowing in right hemisphere. Hyperventilation and photic stimulation were not performed. EEG was not interpretable after 0230 on 10/16/2020 due to significant electrode artifact.   ABNORMALITY - Continuous slow, right hemisphere - Background asymmetry, right<left  IMPRESSION: This study is suggestive of cortical dysfunction in right hemisphere, likely secondary to underlying stroke.  No seizures or epileptiform discharges were seen throughout the recording.  Priyanka Barbra Sarks        Scheduled Meds:  atorvastatin  40 mg Oral Daily   finasteride  5 mg Oral Q0600   levETIRAcetam  1,000 mg Oral BID   psyllium  1 packet Oral Daily   Continuous Infusions:   LOS: 3 days    Time spent: 25 mins.More than 50% of that time was spent in counseling and/or coordination of care.      Shelly Coss, MD [facility] P8/14/2022, 7:41 AM

## 2020-10-18 ENCOUNTER — Other Ambulatory Visit: Payer: Self-pay | Admitting: *Deleted

## 2020-10-18 ENCOUNTER — Other Ambulatory Visit (HOSPITAL_COMMUNITY): Payer: Self-pay

## 2020-10-18 DIAGNOSIS — R531 Weakness: Secondary | ICD-10-CM | POA: Diagnosis not present

## 2020-10-18 MED ORDER — LOSARTAN POTASSIUM 50 MG PO TABS
50.0000 mg | ORAL_TABLET | Freq: Every day | ORAL | Status: DC
  Administered 2020-10-18 – 2020-10-20 (×3): 50 mg via ORAL
  Filled 2020-10-18 (×5): qty 1

## 2020-10-18 MED ORDER — DABIGATRAN ETEXILATE MESYLATE 150 MG PO CAPS
150.0000 mg | ORAL_CAPSULE | Freq: Two times a day (BID) | ORAL | Status: DC
  Administered 2020-10-18 – 2020-10-22 (×7): 150 mg via ORAL
  Filled 2020-10-18 (×11): qty 1

## 2020-10-18 NOTE — Progress Notes (Signed)
Occupational Therapy Treatment Patient Details Name: Clifford [name] MRN: [id number] DOB: 10/29/1950 Today's Date: 10/18/2020    History of present illness 70 yo male presented to ED on 8/11 for R sided headache and worsening L UE weakness. Patient recently admitted 8/3-8/5 for R CVA with L visual deficits and L sided weakness. MRI with mild enlargement of R MCA/border zone infarct since 10/07/20 MRI and increased cytotoxic edema and petechial hemorrhage without midline shift. 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 Pt with decreased engagement and verbalization. Demonstrating poor cognition, balance, and vision. Pt requiring Max A +2 for bed mobility with functional transfers. Use of stedy to challenge and faicltiate sit<>stand and sitting balance. Use of mirror for finding midline.  Pt with decreased functional use of LUE. Continue to recommend dc to CIR for intensive OT and will continue to follow acutely as admitted.    Follow Up Recommendations  CIR    Equipment Recommendations  None recommended by OT    Recommendations for Other Services PT consult;Rehab consult    Precautions / Restrictions Precautions Precautions: Fall Precaution Comments: L visual field deficit, L inattention Restrictions Weight Bearing Restrictions: No       Mobility Bed Mobility Overal bed mobility: Needs Assistance Bed Mobility: Supine to Sit     Supine to sit: Max assist;+2 for physical assistance     General bed mobility comments: pt not initiating bed mobility when cued, needed max A at LE's as well as trunk and to gain balance once up at EOB. Pt with posterior and R lean    Transfers Overall transfer level: Needs assistance Equipment used: None;Ambulation equipment used Transfers: Sit to/from [facility] Sit to Stand: Max assist;+2 physical assistance Stand pivot transfers: Max  assist;+2 physical assistance       General transfer comment: pt required max A +2 from bed for sit>stand with L knee needing to be blocked and heavy L lean as well as minimal initiation from pt. Pt unable to effectively step feet to chair, max A +2 needed to pivot to R. Pt then stood to stedy with max A +2 and performed multiple sit<>stand from flaps of stedy with min A +2 and pt initiating.    Balance Overall balance assessment: Needs assistance Sitting-balance support: Single extremity supported;Feet supported Sitting balance-Leahy Scale: Poor Sitting balance - Comments: needed up to mod A to maintain sitting EOB due to R and posterior lean Postural control: Posterior lean;Right lateral lean Standing balance support: Single extremity supported Standing balance-Leahy Scale: Zero Standing balance comment: heavy L lean in standing. Worked on maintaining midline in front of mirror at sink                           ADL either performed or assessed with clinical judgement   ADL Overall ADL's : Needs assistance/impaired Eating/Feeding: Minimal assistance;Sitting Eating/Feeding Details (indicate cue type and reason): Min A for opening containers and sitting poor to R of midline due to visual deficits. Pt requring cues to locate items demonstrating worsening L inattention and possible field cut                     Toilet Transfer: Maximal assistance;+2 for physical assistance;Stand-pivot (simulated to recliner)           Functional mobility during ADLs: Cueing for safety;Cueing for  sequencing;Maximal assistance;+2 for physical assistance General ADL Comments: Pt more lethargic and decreased arousal.     Vision   Vision Assessment?: Yes Alignment/Gaze Preference: Head turned;Gaze right Visual Fields: Left homonymous hemianopsia Additional Comments: Increased inattention to L and possible increased field cut. Prior session, pt could not locate items in left posterior  field but compensating with head turns. Today, pt with R gize and head turn. difficulty locating items in midline   Perception     Praxis      Cognition Arousal/Alertness: Lethargic Behavior During Therapy: Flat affect Overall Cognitive Status: Impaired/Different from baseline Area of Impairment: Problem solving;Awareness;Memory;Attention;Following commands;Safety/judgement                   Current Attention Level: Sustained Memory: Decreased short-term memory Following Commands: Follows one step commands inconsistently Safety/Judgement: Decreased awareness of safety;Decreased awareness of deficits Awareness: Emergent Problem Solving: Slow processing;Requires verbal cues General Comments: pt with minimal verbalization today, lethargic, less aware of his body. Difficulty recalling information from prior session. Poor compensatory techniques for visual deficits        Exercises     Shoulder Instructions       General Comments BP stable    Pertinent Vitals/ Pain       Pain Assessment: 0-10 Pain Score: 7  Pain Location: head Pain Descriptors / Indicators: Headache Pain Intervention(s): Monitored during session;Limited activity within patient's tolerance;Repositioned  Home Living                                          Prior Functioning/Environment              Frequency  Min 2X/week        Progress Toward Goals  OT Goals(current goals can now be found in the care plan section)  Progress towards OT goals: Progressing toward goals  Acute Rehab OT Goals Patient Stated Goal: to get my vision normal OT Goal Formulation: With patient Time For Goal Achievement: 10/29/20 Potential to Achieve Goals: Good ADL Goals Pt Will Perform Grooming: with supervision;standing Pt Will Perform Lower Body Bathing: Independently Pt Will Perform Lower Body Dressing: with supervision;sit to/from stand Pt Will Transfer to Toilet: with  supervision;ambulating;regular height toilet Pt Will Perform Toileting - Clothing Manipulation and hygiene: with supervision;sit to/from stand;sitting/lateral leans Additional ADL Goal #1: Pt will locate three grooming items in left visual field with Min cues Additional ADL Goal #2: Pt will demonstrate emergent awareness during ADLs with Min cues  Plan Discharge plan remains appropriate    Co-evaluation    PT/OT/SLP Co-Evaluation/Treatment: Yes Reason for Co-Treatment: For patient/therapist safety;To address functional/ADL transfers PT goals addressed during session: Mobility/safety with mobility;Balance;Proper use of DME;Strengthening/ROM OT goals addressed during session: ADL's and self-care      AM-PAC OT "6 Clicks" Daily Activity     Outcome Measure   Help from another person eating meals?: A Little Help from another person taking care of personal grooming?: A Little Help from another person toileting, which includes using toliet, bedpan, or urinal?: A Little Help from another person bathing (including washing, rinsing, drying)?: A Little Help from another person to put on and taking off regular upper body clothing?: A Little Help from another person to put on and taking off regular lower body clothing?: A Little 6 Click Score: 18    End of Session Equipment Utilized During Treatment: Gait belt  OT Visit Diagnosis: Unsteadiness on feet (R26.81)   Activity Tolerance Patient tolerated treatment well   Patient Left with call [place]/phone within reach;in chair;with chair alarm set   Nurse Communication Mobility status        Time: [id number] OT Time Calculation (min): 36 min  Charges: OT General Charges $OT Visit: 1 Visit OT Treatments $Self Care/Home Management : 8-22 mins  [place], OTR/L Acute Rehab Pager: [phone] Office: [place] 10/18/2020, 1:26 PM

## 2020-10-18 NOTE — Progress Notes (Signed)
Physical Therapy Treatment Patient Details Name: Clifford [name] MRN: [id number] DOB: 06-20-1950 Today's Date: 10/18/2020    History of Present Illness 70 y/o male presented to ED on 8/11 for R sided headache and worsening L UE weakness. Patient recently admitted 8/3-8/5 for R CVA with L visual deficits and L sided weakness. MRI with mild enlargement of R MCA/border zone infarct since 10/07/20 MRI and increased cytotoxic edema and petechial hemorrhage without midline shift. PMH: HTN, HLD, hx of PE on coumadin, CVA 10/2020    PT Comments    Pt with further functional decline since session on Saturday, RN present and aware. Pt lethargic, with decreased verbalization throughout session. Required max A +2 to come to EOB and mod A to sit due to posterior and R lean. Pt stood with max A +2 and transferred to R to recliner. Pt then stood to stedy and worked on standing from higher surface which he was able to do with min A +2 but maintained L lean in standing. Midline standing did improve with use of mirror. Continue to recommend CIR at d/c. Of note, pt with shaking/ jerking of RLE when PT/OT arrived, stopped with active motion, returned at rest.     Follow Up Recommendations  CIR     Equipment Recommendations  Other (comment) (TBD)    Recommendations for Other Services Rehab consult     Precautions / Restrictions Precautions Precautions: Fall Precaution Comments: L visual field deficit, L inattention Restrictions Weight Bearing Restrictions: No    Mobility  Bed Mobility Overal bed mobility: Needs Assistance Bed Mobility: Supine to Sit     Supine to sit: Max assist;+2 for physical assistance     General bed mobility comments: pt not initiating bed mobility when cued, needed max A at LE's as well as trunk and to gain balance once up at EOB. Pt with posterior and R lean    Transfers Overall transfer level: Needs assistance Equipment used: None;Ambulation equipment used Transfers: Sit  to/from [facility] Sit to Stand: Max assist;+2 physical assistance Stand pivot transfers: Max assist;+2 physical assistance       General transfer comment: pt required max A +2 from bed for sit>stand with L knee needing to be blocked and heavy L lean as well as minimal initiation from pt. Pt unable to effectively step feet to chair, max A +2 needed to pivot to R. Pt then stood to stedy with max A +2 and performed multiple sit<>stand from flaps of stedy with min A +2 and pt initiating.  Ambulation/Gait             General Gait Details: attempted pregait activities in standing but pt doing more poorly with wt shifting today, maintaining L lean unless heavily facilitated. Did some minimal stepping R and L.   Stairs             Wheelchair Mobility    Modified Rankin (Stroke Patients Only) Modified Rankin (Stroke Patients Only) Pre-Morbid Rankin Score: Moderately severe disability Modified Rankin: Severe disability     Balance Overall balance assessment: Needs assistance Sitting-balance support: Single extremity supported;Feet supported Sitting balance-Leahy Scale: Poor Sitting balance - Comments: needed up to mod A to maintain sitting EOB due to R and posterior lean Postural control: Posterior lean;Right lateral lean Standing balance support: Single extremity supported Standing balance-Leahy Scale: Zero Standing balance comment: heavy L lean in standing. Worked on maintaining midline in front of mirror at sink  Cognition Arousal/Alertness: Lethargic Behavior During Therapy: Flat affect Overall Cognitive Status: Impaired/Different from baseline Area of Impairment: Problem solving;Awareness;Memory;Attention;Following commands;Safety/judgement                   Current Attention Level: Sustained Memory: Decreased short-term memory Following Commands: Follows one step commands inconsistently Safety/Judgement:  Decreased awareness of safety;Decreased awareness of deficits Awareness: Emergent Problem Solving: Slow processing;Requires verbal cues General Comments: pt with minimal verbalization today, lethargic, less aware of his body but even less aware of everything going on around him than on Saturday      Exercises      General Comments General comments (skin integrity, edema, etc.): even when pt more alert, remained very flat and minimally verbal throughout session. BP 152/72, HR 52 bpm      Pertinent Vitals/Pain Pain Assessment: 0-10 Pain Score: 7  Pain Location: head Pain Descriptors / Indicators: Headache Pain Intervention(s): Limited activity within patient's tolerance;Monitored during session;RN gave pain meds during session    Home Living                      Prior Function            PT Goals (current goals can now be found in the care plan section) Acute Rehab PT Goals Patient Stated Goal: to get my vision normal PT Goal Formulation: With patient Time For Goal Achievement: 10/29/20 Potential to Achieve Goals: Fair Progress towards PT goals: Not progressing toward goals - comment (further decline in function)    Frequency    Min 4X/week      PT Plan Current plan remains appropriate    Co-evaluation PT/OT/SLP Co-Evaluation/Treatment: Yes Reason for Co-Treatment: Complexity of the patient's impairments (multi-system involvement);Necessary to address cognition/behavior during functional activity;For patient/therapist safety PT goals addressed during session: Mobility/safety with mobility;Balance;Proper use of DME;Strengthening/ROM        AM-PAC PT "6 Clicks" Mobility   Outcome Measure  Help needed turning from your back to your side while in a flat bed without using bedrails?: Total Help needed moving from lying on your back to sitting on the side of a flat bed without using bedrails?: Total Help needed moving to and from a bed to a chair (including a  wheelchair)?: Total Help needed standing up from a chair using your arms (e.g., wheelchair or bedside chair)?: Total Help needed to walk in hospital room?: Total   6 Click Score: 5    End of Session Equipment Utilized During Treatment: Gait belt Activity Tolerance: Patient tolerated treatment well Patient left: with call [place]/phone within reach;in chair;with chair alarm set Nurse Communication: Mobility status;Need for lift equipment PT Visit Diagnosis: Unsteadiness on feet (R26.81);Muscle weakness (generalized) (M62.81);Other symptoms and signs involving the nervous system (R29.898);Other abnormalities of gait and mobility (R26.89)     Time: [id number] PT Time Calculation (min) (ACUTE ONLY): 37 min  Charges:  $Therapeutic Activity: 8-22 mins                     Leighton Roach, [place]  Pager [phone] Office [place] 10/18/2020, 12:57 PM

## 2020-10-18 NOTE — Plan of Care (Signed)
  Problem: Education: Goal: Knowledge of General Education information will improve Description: Including pain rating scale, medication(s)/side effects and non-pharmacologic comfort measures Outcome: Progressing   Problem: Health Behavior/Discharge Planning: Goal: Ability to manage health-related needs will improve Outcome: Progressing   Problem: Clinical Measurements: Goal: Ability to maintain clinical measurements within normal limits will improve Outcome: Progressing Goal: Will remain free from infection Outcome: Progressing Goal: Diagnostic test results will improve Outcome: Progressing Goal: Respiratory complications will improve Outcome: Progressing Goal: Cardiovascular complication will be avoided Outcome: Progressing   Problem: Nutrition: Goal: Adequate nutrition will be maintained Outcome: Progressing   Problem: Coping: Goal: Level of anxiety will decrease Outcome: Progressing   Problem: Elimination: Goal: Will not experience complications related to urinary retention Outcome: Progressing   Problem: Pain Managment: Goal: General experience of comfort will improve Outcome: Progressing   Problem: Safety: Goal: Ability to remain free from injury will improve Outcome: Progressing   Problem: Skin Integrity: Goal: Risk for impaired skin integrity will decrease Outcome: Progressing   Problem: Education: Goal: Knowledge of disease or condition will improve Outcome: Progressing Goal: Knowledge of secondary prevention will improve Outcome: Progressing Goal: Knowledge of patient specific risk factors addressed and post discharge goals established will improve Outcome: Progressing Goal: Individualized Educational Video(s) Outcome: Progressing   Problem: Coping: Goal: Will verbalize positive feelings about self Outcome: Progressing Goal: Will identify appropriate support needs Outcome: Progressing   Problem: Health Behavior/Discharge Planning: Goal: Ability  to manage health-related needs will improve Outcome: Progressing   Problem: Self-Care: Goal: Ability to participate in self-care as condition permits will improve Outcome: Progressing Goal: Verbalization of feelings and concerns over difficulty with self-care will improve Outcome: Progressing Goal: Ability to communicate needs accurately will improve Outcome: Progressing   Problem: Nutrition: Goal: Risk of aspiration will decrease Outcome: Progressing Goal: Dietary intake will improve Outcome: Progressing   Problem: Intracerebral Hemorrhage Tissue Perfusion: Goal: Complications of Intracerebral Hemorrhage will be minimized Outcome: Progressing   Problem: Ischemic Stroke/TIA Tissue Perfusion: Goal: Complications of ischemic stroke/TIA will be minimized Outcome: Progressing   Problem: Spontaneous Subarachnoid Hemorrhage Tissue Perfusion: Goal: Complications of Spontaneous Subarachnoid Hemorrhage will be minimized Outcome: Progressing

## 2020-10-18 NOTE — Progress Notes (Signed)
Patient ID: Clifford [name], male   DOB: [birth year], 70 y.o.   MRN: [id number] STROKE TEAM PROGRESS NOTE     SUBJECTIVE (INTERVAL HISTORY) He is doing much better. Less confused and no further arm/hand twitching. He has no complaints today. Vital signs stable. No seizures reported by pt or nurse. Long-term EEG monitoring which suggested cortical dysfunction in the right hemisphere but no seizures or epileptiform discharges noted OBJECTIVE Temp:  [98.4 F (36.9 C)-99 F (37.2 C)] 99 F (37.2 C) (08/15 1119) Pulse Rate:  [47-87] 47 (08/15 1119) Cardiac Rhythm: Sinus bradycardia;Heart block (08/15 0700) Resp:  [14-18] 14 (08/15 1119) BP: (123-169)/(65-83) 123/65 (08/15 1119) SpO2:  [97 %-100 %] 100 % (08/15 1119)  No results for input(s): GLUCAP in the last 168 hours. Recent Labs  Lab 10/14/20 0913 10/14/20 0920 10/15/20 0500 10/16/20 0346  NA 140 137 135 134*  K 3.6 3.7 3.6 4.1  CL 104 104 102 104  CO2  --  '22 23 24  '$ GLUCOSE 118* 117* 86 104*  BUN '19 17 18 15  '$ CREATININE 1.20 1.30* 1.22 1.25*  CALCIUM  --  9.2 8.9 8.8*  MG  --   --  1.8  --    Recent Labs  Lab 10/14/20 0920 10/15/20 0500  AST 22 19  ALT 13 12  ALKPHOS 77 65  BILITOT 1.0 0.8  PROT 7.1 6.2*  ALBUMIN 3.6 3.0*   Recent Labs  Lab 10/14/20 0913 10/14/20 0920 10/15/20 0500 10/16/20 0346  WBC  --  4.3 4.3 4.4  NEUTROABS  --  2.6 1.8  --   HGB 14.3 13.5 11.8* 11.8*  HCT 42.0 42.1 37.9* 36.7*  MCV  --  81.6 83.8 81.9  PLT  --  227 193 169   No results for input(s): CKTOTAL, CKMB, CKMBINDEX, TROPONINI in the last 168 hours. No results for input(s): LABPROT, INR in the last 72 hours.  No results for input(s): COLORURINE, LABSPEC, Crum, GLUCOSEU, HGBUR, BILIRUBINUR, KETONESUR, PROTEINUR, UROBILINOGEN, NITRITE, LEUKOCYTESUR in the last 72 hours.  Invalid input(s): APPERANCEUR     Component Value Date/Time   CHOL 128 10/07/2020 0450   TRIG 63 10/07/2020 0450   HDL 40 (L) 10/07/2020 0450   CHOLHDL  3.2 10/07/2020 0450   VLDL 13 10/07/2020 0450   LDLCALC 75 10/07/2020 0450   Lab Results  Component Value Date   HGBA1C 5.9 (H) 10/07/2020   No results found for: LABOPIA, COCAINSCRNUR, LABBENZ, AMPHETMU, THCU, LABBARB  No results for input(s): ETH in the last 168 hours.  I have personally reviewed the radiological images below and agree with the radiology interpretations.  CT Angio Head W or Wo Contrast  Result Date: 10/06/2020 CLINICAL DATA:  Stroke. TIA. Assess intracranial arteries. Unsteady gait. Left-sided weakness. Left-sided visual loss. Headache. EXAM: CT ANGIOGRAPHY HEAD AND NECK TECHNIQUE: Multidetector CT imaging of the head and neck was performed using the standard protocol during bolus administration of intravenous contrast. Multiplanar CT image reconstructions and MIPs were obtained to evaluate the vascular anatomy. Carotid stenosis measurements (when applicable) are obtained utilizing NASCET criteria, using the distal internal carotid diameter as the denominator. CONTRAST:  143m OMNIPAQUE IOHEXOL 350 MG/ML SOLN COMPARISON:  Head CT earlier same day FINDINGS: CTA NECK FINDINGS Aortic arch: Aortic atherosclerosis.  Branching pattern is normal. Right carotid system: Common carotid artery is tortuous but widely patent to the bifurcation. Carotid bifurcation is widely patent. Minimal calcified plaque at the distal ICA bulb but no stenosis. Cervical ICA widely patent.  Left carotid system: Common carotid artery is tortuous but widely patent to the bifurcation. Carotid bifurcation is normal. Minimal calcified plaque at the ICA bulb but no stenosis. Cervical ICA widely patent beyond that. Vertebral arteries: Right vertebral artery origin is widely patent. Left vertebral artery origin is poorly seen because of regional venous reflux. Both vertebral arteries do appear patent through the cervical region to the foramen magnum. Skeleton: Ordinary cervical spondylosis. Other neck: No mass or  lymphadenopathy. Upper chest: Upper lungs are clear. Review of the MIP images confirms the above findings CTA HEAD FINDINGS Anterior circulation: Both internal carotid arteries are patent through the skull base and siphon regions. There is ordinary siphon atherosclerotic calcification. On the left, there is no stenosis. On the right, there is 50-70% stenosis in the siphon. Supraclinoid internal carotid arteries are widely patent. The anterior and middle cerebral vessels are patent. No large vessel occlusion, with particular attention to the right MCA. Posterior circulation: Both vertebral arteries are patent through the foramen magnum to the basilar. No basilar stenosis. Posterior circulation branch vessels are normal. Venous sinuses: Patent and normal. Anatomic variants: None significant. Review of the MIP images confirms the above findings IMPRESSION: No intracranial large vessel occlusion identified at this time, with specific attention to the right MCA branches. Minimal atherosclerotic change at both internal carotid artery bulbs. No stenosis. 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 No stenosis on the left. 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 Electronically Signed   By: Nelson Chimes M.D.   On: 10/06/2020 15:01   CT HEAD WO CONTRAST (5MM)  Result Date: 10/15/2020 CLINICAL DATA:  Neuro deficit, acute, stroke suspected. New focal defects. EXAM: CT HEAD WITHOUT CONTRAST TECHNIQUE: Contiguous axial images were obtained from the base of the skull through the vertex without intravenous contrast. COMPARISON:  Head CT and MRI yesterday. FINDINGS: Brain: Confluent acute infarction in the right posterior temporal and temporoparietal junction regions shows discrete low-density by CT, with minimal petechial blood products but no frank hematoma. Smaller areas cortical and subcortical infarction in the right frontal and frontoparietal cortex and underlying white matter are seen by CT is subtle  findings. No hemorrhage in that region. No new insult is identified. Mild swelling but no significant mass effect or any midline shift. No hydrocephalus. No extra-axial collection. Vascular: There is atherosclerotic calcification of the major vessels at the base of the brain. Skull: Negative Sinuses/Orbits: Clear/normal Other: None IMPRESSION: No apparent change since yesterday. Confluent infarction at the right posterior temporal and temporoparietal junction region with petechial blood products but no frank hematoma. Smaller more subtle infarctions of the cortical and subcortical brain in the right frontal and frontoparietal region appear unchanged. Electronically Signed   By: Nelson Chimes M.D.   On: 10/15/2020 15:16   CT HEAD WO CONTRAST (5MM)  Result Date: 10/14/2020 CLINICAL DATA:  Right-sided headache, recent stroke EXAM: CT HEAD WITHOUT CONTRAST TECHNIQUE: Contiguous axial images were obtained from the base of the skull through the vertex without intravenous contrast. COMPARISON:  10/06/2020 CT head, 10/07/2020 MRI head FINDINGS: Brain: Area of hypodensity in the right parietal and occipital lobes, which correlates with the area of infarct seen on the 10/06/2020 CT and 10/07/2020 MRI, with edema in this area. Subtle areas of hyperdensity, likely petechial hemorrhage. No evidence of hemorrhagic transformation. No new areas of infarction. No significant midline shift. No mass or extra-axial collection. Vascular: No hyperdense vessel or unexpected calcification. Skull: Normal. Negative for fracture or focal lesion. Sinuses/Orbits: Mucosal  thickening in the left frontal sinus and bilateral ethmoid air cells. The orbits are unremarkable. Other: None. IMPRESSION: Expected evolution of the previously noted right MCA/PCA territory infarct, with petechial hemorrhage and mild edema, without midline shift or hemorrhagic transformation. Electronically Signed   By: Merilyn Baba MD   On: 10/14/2020 10:02   CT HEAD WO  CONTRAST (5MM)  Result Date: 10/06/2020 CLINICAL DATA:  Neuro deficit, acute, stroke suspected. Steady gait, left-sided weakness and left-sided vision loss, increased blood pressure, headache for 3 days, history of hypertension. EXAM: CT HEAD WITHOUT CONTRAST TECHNIQUE: Contiguous axial images were obtained from the base of the skull through the vertex without intravenous contrast. COMPARISON:  No pertinent prior exams available for comparison. FINDINGS: Brain: Cerebral volume is normal for age. Region of abnormal cortical/subcortical hypodensity measuring 6.3 x 3.3 x 5.8 cm within the right parietooccipital lobes compatible with acute/early subacute infarction (right PCA vascular territory and right MCA/PCA watershed territory). No significant mass effect at this time. No evidence of hemorrhagic conversion. Chronic appearing lacunar infarct within the left lentiform nucleus. No extra-axial fluid collection. No evidence of an intracranial mass. No midline shift. Vascular: No hyperdense vessel.  Atherosclerotic calcifications. Skull: Normal. Negative for fracture or focal lesion. Sinuses/Orbits: Visualized orbits show no acute finding. Mild mucosal thickening within the frontal sinuses bilaterally. Mild-to-moderate mucosal thickening within the bilateral ethmoid air cells. These results were discussed by telephone at the time of interpretation on 10/06/2020 at 1:55 pm with provider MATTHEW TRIFAN , who verbally acknowledged these results. IMPRESSION: 6.3 x 3.3 x 5.8 cm acute/early subacute cortical and subcortical infarct within the right parietooccipital lobes (right PCA vascular territory and right MCA/PCA watershed territory). Consider MR or CT angiography for further evaluation. Chronic left basal ganglia lacunar infarct. Paranasal sinus disease at the imaged levels, as described. Electronically Signed   By: Kellie Simmering DO   On: 10/06/2020 14:13   CT Angio Neck W and/or Wo Contrast  Result Date:  10/06/2020 CLINICAL DATA:  Stroke. TIA. Assess intracranial arteries. Unsteady gait. Left-sided weakness. Left-sided visual loss. Headache. EXAM: CT ANGIOGRAPHY HEAD AND NECK TECHNIQUE: Multidetector CT imaging of the head and neck was performed using the standard protocol during bolus administration of intravenous contrast. Multiplanar CT image reconstructions and MIPs were obtained to evaluate the vascular anatomy. Carotid stenosis measurements (when applicable) are obtained utilizing NASCET criteria, using the distal internal carotid diameter as the denominator. CONTRAST:  135m OMNIPAQUE IOHEXOL 350 MG/ML SOLN COMPARISON:  Head CT earlier same day FINDINGS: CTA NECK FINDINGS Aortic arch: Aortic atherosclerosis.  Branching pattern is normal. Right carotid system: Common carotid artery is tortuous but widely patent to the bifurcation. Carotid bifurcation is widely patent. Minimal calcified plaque at the distal ICA bulb but no stenosis. Cervical ICA widely patent. Left carotid system: Common carotid artery is tortuous but widely patent to the bifurcation. Carotid bifurcation is normal. Minimal calcified plaque at the ICA bulb but no stenosis. Cervical ICA widely patent beyond that. Vertebral arteries: Right vertebral artery origin is widely patent. Left vertebral artery origin is poorly seen because of regional venous reflux. Both vertebral arteries do appear patent through the cervical region to the foramen magnum. Skeleton: Ordinary cervical spondylosis. Other neck: No mass or lymphadenopathy. Upper chest: Upper lungs are clear. Review of the MIP images confirms the above findings CTA HEAD FINDINGS Anterior circulation: Both internal carotid arteries are patent through the skull base and siphon regions. There is ordinary siphon atherosclerotic calcification. On the left, there is  no stenosis. On the right, there is 50-70% stenosis in the siphon. Supraclinoid internal carotid arteries are widely patent. The  anterior and middle cerebral vessels are patent. No large vessel occlusion, with particular attention to the right MCA. Posterior circulation: Both vertebral arteries are patent through the foramen magnum to the basilar. No basilar stenosis. Posterior circulation branch vessels are normal. Venous sinuses: Patent and normal. Anatomic variants: None significant. Review of the MIP images confirms the above findings IMPRESSION: No intracranial large vessel occlusion identified at this time, with specific attention to the right MCA branches. Minimal atherosclerotic change at both internal carotid artery bulbs. No stenosis. 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 No stenosis on the left. 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 Electronically Signed   By: Nelson Chimes M.D.   On: 10/06/2020 15:01   MR BRAIN WO CONTRAST  Result Date: 10/14/2020 CLINICAL DATA:  Stroke, follow up.  Eval for progression of stroke. EXAM: MRI HEAD WITHOUT CONTRAST TECHNIQUE: Multiplanar, multiecho pulse sequences of the brain and surrounding structures were obtained without intravenous contrast. COMPARISON:  Head CT 10/14/2020 and MRI 10/07/2020 FINDINGS: The study is intermittently motion degraded including severe motion on the coronal T2 sequence. Brain: The moderately large infarct involving the posterior right MCA territory and PCA border zone has mildly expanded compared to the prior MRI, most notable anteriorly and superiorly. Associated petechial hemorrhage is partially confluent and has increased from the prior MRI. There is progressive cytotoxic edema with regional sulcal effacement and mass effect on the right lateral ventricle. New mildly restricted diffusion in the splenium of the corpus callosum may reflect acute wallerian degeneration. Small cortical and subcortical infarcts in the right frontal lobe (border zone distribution) have also mildly increased from the prior MRI. Small T2 hyperintensities  elsewhere in the cerebral white matter bilaterally are similar to the prior MRI and are nonspecific but compatible with mild chronic small vessel ischemic disease. There is no midline shift or extra-axial fluid collection. A chronic hemorrhagic infarct is again noted in the posterior aspect of the left lentiform nucleus/external capsule. Vascular: Major intracranial vascular flow voids are preserved. Skull and upper cervical spine: Unremarkable bone marrow signal para Sinuses/Orbits: Unremarkable orbits. Mild mucosal thickening in the paranasal sinuses. Clear mastoid air cells. Other: None. IMPRESSION: 1. Mild enlargement of the right MCA/border zone infarct since the 10/07/2020 MRI. Increased cytotoxic edema and petechial hemorrhage without midline shift. 2. Chronic small-vessel ischemia including an old hemorrhagic infarct in the left basal ganglia. Electronically Signed   By: Logan Bores M.D.   On: 10/14/2020 18:22   MR BRAIN WO CONTRAST  Result Date: 10/07/2020 CLINICAL DATA:  Neuro deficit, acute, stroke suspected. Additional history provided: Vision change and mild left-sided weakness which began 2 days ago. EXAM: MRI HEAD WITHOUT CONTRAST TECHNIQUE: Multiplanar, multiecho pulse sequences of the brain and surrounding structures were obtained without intravenous contrast. COMPARISON:  Noncontrast head CT 10/06/2020. CT angiogram head/neck 10/06/2020. FINDINGS: Brain: Cerebral volume is normal. Region of abnormal cortical/subcortical restricted diffusion within the right parietal and occipital lobes measuring 6.2 x 3.3 x 6.4 cm compatible with acute/early subacute infarction (at the junction of the right MCA and PCA vascular territories). This has not significantly changed in extent as compared to the head CT performed yesterday 10/06/2020. Mild T1 hyperintensity and SWI signal loss within the infarction territory compatible with petechial hemorrhage and possibly cortical laminar necrosis. No significant  mass effect at this time. There are numerous additional small patchy cortical  and subcortical acute/early subacute infarcts within the right frontal and parietal lobes (watershed distribution). Background mild multifocal T2/FLAIR hyperintensity within the cerebral white matter, nonspecific but compatible with chronic small vessel ischemic disease. Chronic lacunar infarct with associated chronic hemosiderin deposition within the left basal ganglia/posterior limb of left internal capsule. No evidence of an intracranial mass. No extra-axial fluid collection. No midline shift. Vascular: Maintained proximal arterial flow voids. Skull and upper cervical spine: No focal suspicious marrow lesion. Incompletely assessed cervical spondylosis. Sinuses/Orbits: Visualized orbits show no acute finding. 11 mm left maxillary sinus mucous retention cyst. Moderate bilateral ethmoid sinus mucosal thickening. Trace mucosal thickening within the left frontal sinus. IMPRESSION: 6.2 x 6.4 cm acute/early subacute cortical and subcortical infarction within the right parietal and occipital lobes (at the junction of the right MCA and PCA territories). The infarct has not significantly changed in extent as compared to the head CT of 10/06/2020. Mild petechial hemorrhage within the infarction territory. No significant mass effect at this time. Numerous additional small patchy cortical and subcortical acute/early subacute infarcts within the right frontal and parietal lobes (watershed distribution). 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 Background mild chronic small vessel ischemic changes within the cerebral white matter. Paranasal sinus disease at the imaged levels, as described. Electronically Signed   By: Kellie Simmering DO   On: 10/07/2020 07:57   EEG adult  Result Date: 10/15/2020 Lora Havens, MD     10/15/2020 10:31 AM Patient Name: Clifford [name] MRN: [id number]  Epilepsy Attending: Lora Havens Referring Physician/Provider: Dr Lesleigh Noe Date: 10/15/2020 Duration: 23.02 mins Patient history: 70 year old male with posterior right parietal watershed infarct who presented with worsening stroke symptoms as well as left thumb rhythmic and nonsuppressible shaking movements.  EEG to evaluate for seizures. Level of alertness: Awake, asleep AEDs during EEG study: LEV Technical aspects: This EEG study was done with scalp electrodes positioned according to the 10-20 International system of electrode placement. Electrical activity was acquired at a sampling rate of '500Hz'$  and reviewed with a high frequency filter of '70Hz'$  and a low frequency filter of '1Hz'$ . EEG data were recorded continuously and digitally stored. Description: The posterior dominant rhythm consists of 8-9 Hz activity of moderate voltage (25-35 uV) seen predominantly in posterior head regions, asymmetric ( R<L) and reactive to eye opening and eye closing. Sleep was characterized by vertex waves, sleep spindles (12 to 14 Hz), maximal frontocentral region.  EEG showed continuous rhythmic 3 to 5 Hz theta-delta slowing in right hemisphere. Hyperventilation and photic stimulation were not performed.   ABNORMALITY - Continuous slow, right hemisphere - Background asymmetry, right<left IMPRESSION: This study is suggestive of cortical dysfunction in right hemisphere, likely secondary to underlying stroke.  No seizures or epileptiform discharges were seen throughout the recording. Priyanka Barbra Sarks   Overnight EEG with video  Result Date: 10/16/2020 Lora Havens, MD     10/16/2020  3:23 PM Patient Name: Clifford [name] MRN: [id number] Epilepsy Attending: Lora Havens Referring Physician/Provider: Dr Rosalin Hawking Duration: 10/15/2020 1639 to 10/16/2020 0230  Patient history: 70 year old male with posterior right parietal watershed infarct who presented with worsening stroke symptoms as well as left thumb rhythmic and  nonsuppressible shaking movements.  EEG to evaluate for seizures.  Level of alertness: Awake, asleep  AEDs during EEG study: LEV  Technical aspects: This EEG study was done with scalp electrodes positioned according to the 10-20 International system of electrode placement. Dealer  activity was acquired at a sampling rate of '500Hz'$  and reviewed with a high frequency filter of '70Hz'$  and a low frequency filter of '1Hz'$ . EEG data were recorded continuously and digitally stored.  Description: The posterior dominant rhythm consists of 8-9 Hz activity of moderate voltage (25-35 uV) seen predominantly in posterior head regions, asymmetric ( R<L) and reactive to eye opening and eye closing. Sleep was characterized by vertex waves, sleep spindles (12 to 14 Hz), maximal frontocentral region.  EEG showed continuous rhythmic 3 to 5 Hz theta-delta slowing in right hemisphere. Hyperventilation and photic stimulation were not performed. EEG was not interpretable after 0230 on 10/16/2020 due to significant electrode artifact.   ABNORMALITY - Continuous slow, right hemisphere - Background asymmetry, right<left  IMPRESSION: This study is suggestive of cortical dysfunction in right hemisphere, likely secondary to underlying stroke.  No seizures or epileptiform discharges were seen throughout the recording.  Lora Havens   ECHOCARDIOGRAM COMPLETE  Result Date: 10/07/2020    ECHOCARDIOGRAM REPORT   Patient Name:   ADEL ALLEMANG Date of Exam: 10/07/2020 Medical Rec #:  [id number]    Height:       65.0 in Accession #:    [id number]   Weight: Date of Birth:  10/18/1950     BSA: Patient Age:    33 years     BP:           115/58 mmHg Patient Gender: M            HR:           66 bpm. Exam Location:  Inpatient Procedure: 2D Echo, Cardiac Doppler and Color Doppler Indications:    Stroke I63.9  History:        Patient has no prior history of Echocardiogram examinations.                 Risk Factors:Hypertension and Dyslipidemia. PVD. PE.   Sonographer:    Vickie Epley RDCS Referring Phys: Ross  1. Left ventricular ejection fraction, by estimation, is 65 to 70%. The left ventricle has hyperdynamic function. The left ventricle has no regional wall motion abnormalities. Left ventricular diastolic parameters are consistent with Grade I diastolic dysfunction (impaired relaxation).  2. Right ventricular systolic function is normal. The right ventricular size is normal. Tricuspid regurgitation signal is inadequate for assessing PA pressure.  3. The mitral valve is normal in structure. No evidence of mitral valve regurgitation. No evidence of mitral stenosis.  4. The aortic valve is tricuspid. Aortic valve regurgitation is not visualized. No aortic stenosis is present.  5. The inferior vena cava is normal in size with greater than 50% respiratory variability, suggesting right atrial pressure of 3 mmHg. FINDINGS  Left Ventricle: Left ventricular ejection fraction, by estimation, is 65 to 70%. The left ventricle has hyperdynamic function. The left ventricle has no regional wall motion abnormalities. The left ventricular internal cavity size was normal in size. There is no left ventricular hypertrophy. Left ventricular diastolic parameters are consistent with Grade I diastolic dysfunction (impaired relaxation). Right Ventricle: The right ventricular size is normal. No increase in right ventricular wall thickness. Right ventricular systolic function is normal. Tricuspid regurgitation signal is inadequate for assessing PA pressure. Left Atrium: Left atrial size was normal in size. Right Atrium: Right atrial size was normal in size. Pericardium: There is no evidence of pericardial effusion. Mitral Valve: The mitral valve is normal in structure. No evidence of mitral valve regurgitation. No evidence  of mitral valve stenosis. Tricuspid Valve: The tricuspid valve is normal in structure. Tricuspid valve regurgitation is not demonstrated.  Aortic Valve: The aortic valve is tricuspid. Aortic valve regurgitation is not visualized. No aortic stenosis is present. Pulmonic Valve: The pulmonic valve was normal in structure. Pulmonic valve regurgitation is not visualized. Aorta: The aortic root is normal in size and structure. Venous: The inferior vena cava is normal in size with greater than 50% respiratory variability, suggesting right atrial pressure of 3 mmHg. IAS/Shunts: No atrial level shunt detected by color flow Doppler.  LEFT VENTRICLE PLAX 2D LVIDd:         3.60 cm      Diastology LVIDs:         2.50 cm      LV e' medial:    6.00 cm/s LV PW:         1.00 cm      LV E/e' medial:  7.4 LV IVS:        1.10 cm      LV e' lateral:   7.62 cm/s LVOT diam:     2.20 cm      LV E/e' lateral: 5.8 LV SV:         97 LVOT Area:     3.80 cm  LV Volumes (MOD) LV vol d, MOD A2C: 98.1 ml LV vol d, MOD A4C: 108.0 ml LV vol s, MOD A2C: 39.6 ml LV vol s, MOD A4C: 40.2 ml LV SV MOD A2C:     58.5 ml LV SV MOD A4C:     108.0 ml LV SV MOD BP:      64.0 ml RIGHT VENTRICLE RV S prime:     14.80 cm/s TAPSE (M-mode): 2.3 cm LEFT ATRIUM           RIGHT ATRIUM LA diam:      3.20 cm RA Area:     14.70 cm LA Vol (A2C): 31.0 ml RA Volume:   36.60 ml LA Vol (A4C): 24.7 ml  AORTIC VALVE LVOT Vmax:   129.00 cm/s LVOT Vmean:  96.600 cm/s LVOT VTI:    0.254 m  AORTA Ao Root diam: 3.50 cm Ao Asc diam:  3.50 cm MITRAL VALVE MV Area (PHT): 2.41 cm    SHUNTS MV Decel Time: 315 msec    Systemic VTI:  0.25 m MV E velocity: 44.50 cm/s  Systemic Diam: 2.20 cm MV A velocity: 65.60 cm/s MV E/A ratio:  0.68 Loralie Champagne MD Electronically signed by Loralie Champagne MD Signature Date/Time: 10/07/2020/5:59:38 PM    Final     TTE  IMPRESSIONS:  1. Left ventricular ejection fraction, by estimation, is 65 to 70%. The  left ventricle has hyperdynamic function. The left ventricle has no  regional wall motion abnormalities. Left ventricular diastolic parameters  are consistent with Grade I diastolic   dysfunction (impaired relaxation).   2. Right ventricular systolic function is normal. The right ventricular  size is normal. Tricuspid regurgitation signal is inadequate for assessing  PA pressure.   3. The mitral valve is normal in structure. No evidence of mitral valve  regurgitation. No evidence of mitral stenosis.   4. The aortic valve is tricuspid. Aortic valve regurgitation is not  visualized. No aortic stenosis is present.   5. The inferior vena cava is normal in size with greater than 50%  respiratory variability, suggesting right atrial pressure of 3 mmHg.   EEG: IMPRESSION: This study is suggestive of cortical dysfunction in right  hemisphere, likely secondary to underlying stroke.  No seizures or epileptiform discharges were seen throughout the recording.    PHYSICAL EXAM  Temp:  [98.4 F (36.9 C)-99 F (37.2 C)] 99 F (37.2 C) (08/15 1119) Pulse Rate:  [47-87] 47 (08/15 1119) Resp:  [14-18] 14 (08/15 1119) BP: (123-169)/(65-83) 123/65 (08/15 1119) SpO2:  [97 %-100 %] 100 % (08/15 1119)  General - Well nourished, well developed elderly male in no apparent distress.  Ophthalmologic - fundi not visualized due to noncooperation.  Cardiovascular - Regular rhythm and rate.  Mental Status -  Level of arousal and orientation to time, place, and person were intact. Language is intact.  Cranial Nerves II - XII - II - Visual field intact OU. III, IV, VI - Extraocular movements intact. V - Facial sensation intact bilaterally. VII - Facial movement intact bilaterally. VIII - Hearing & vestibular intact bilaterally. X - Palate elevates symmetrically. XII - Tongue protrusion intact.  Motor Strength - The patient's strength was normal 5/5 right hemibody.  Left hemiparesis.  Reflexes - The patient's reflexes were symmetrical in all extremities and he had no pathological reflexes.  Sensory - Light touch intact b/l.  Coordination - no twitching noted today. Normal tone.    Gait and Station - deferred.   ASSESSMENT/PLAN Mr. ANKITH ISSA is a 70 y.o. 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 no tPA given due to risk of bleeding  Increased petechial hemorrhage/evolution of previous right MCA/PCA territory infarct with mild edema without midline shift or hemorrhagic transformation MRI mild enlargement with cytotoxic edema and petechial hemorrhage without midline shift compared to 8/4 2D Echo grade 1 diastolic dysfunction, EF 65 to 70% LDL 75 HgbA1c 5.9% SCDs for VTE prophylaxis Pradaxa (dabigatran) twice a day prior to admission, now on No antithrombotic.  Therapy recommendations:  pending Disposition:  pending  LUE non suppressible twitching/weakness, concern for focal seizure activity LTM right hemispheric slowing but no definite epileptiform activity  continue keppra. Since he is stable, no need for additional AED. Continue to monitor.  Hypertension Stable SBP goal 130-150 Long term BP goal normotensive  Hyperlipidemia Home meds:  lipitor 80 mg  LDL 75, goal < 70 on lipitor 40 mg Continue statin at discharge  Other Stroke Risk Factors Advanced age Cigarette smoker, advised to stop smoking Obesity, Body mass index is 35.11 kg/m.  Hx stroke/TIA  Hospital day # 4 Continue Pradaxa for secondary stroke prevention given history of multiple pulmonary emboli in the past.  Continue Keppra for seizures since he is stable no need for additional seizure medications.  Discussed with Dr. Tamsen Meek.  Stroke team will sign off.  Kindly call for questions.  Greater than 50% time during this  25-minute visit was spent on counseling and coordination of care and discussion with care team.  Antony Contras MD   To contact Stroke Continuity provider, please refer to [email or web address] After hours, contact General Neurology

## 2020-10-18 NOTE — Patient Outreach (Signed)
[facility]) Care Management  10/18/2020  Clifford [name] Jul 11, 1950 [id number]   Member scheduled for follow up for EMMI stroke however currently admitted to hospital.  Will follow up with member pending disposition.  Valente David, [place], MSN [place] [phone]

## 2020-10-18 NOTE — Progress Notes (Signed)
PROGRESS NOTE    Clifford [name]  [id number] DOB: Sep 29, 1950 DOA: 10/14/2020 PCP: Pcp, No   Chief Complain:AMS  Brief Narrative:  Patient is a 70 year old male with history of diastolic congestive heart failure, hypertension, hyperlipidemia, PE on anticoagulation, recent right MCA/PCA watershed ischemic stroke who presented with increasing left upper arm weakness, left visual defect.  MRI done on presentation showed evolving/increased size of stroke, petechial hemorrhages, cytotoxic edema, concern for new seizures.  Neurology was consulted and were following.  PT/OT recommending CIR on discharge.  Patient is medically stable for discharge to CIR as soon as bed is available.  Assessment & Plan:   Principal Problem:   Left-sided weakness Active Problems:   Stroke [facility])   Essential hypertension   Mixed hyperlipidemia   Prediabetes   History of pulmonary embolism   Chronic anticoagulation   Nicotine dependence, cigarettes, uncomplicated   Petechial hemorrhage   Acute stroke due to ischemia [facility])   Left-sided weakness: Secondary to worsening right parietal occipital MCA/PCA watershed stroke complicated by worsening petechial hemorrhage.  MRI of the brain reviewed.  Pradaxa was held now restarted.  Repeat CT head on 8/12 did not show any apparent change.  Neurology were following.  PT/OT recommended CIR on discharge.  Concern for seizure: Likely precipitated in the setting of worsening stroke with petechial hemorrhage.  EEG showed cortical dysfunction , no epileptiform discharges.  Currently on Keppra.  Hypertension: Continue monitoring blood pressure.   On hydrochlorothiazide and losartan at home.  Losartan resumed on low-dose  Sinus bradycardia: Asymptomatic.  Continue to monitor.  Hyperlipidemia: On Lipitor.  Recent LDL of 75.  Prediabetes: Monitor blood sugars.  Carbohydrate consistent diet.  History of multiple pulmonary embolism: On lifelong anticoagulation.  Takes Pradaxa at  home which was held due to finding of petechial hemorrhage,now restarted  Tobacco abuse: Counseled cessation.  Declined nicotine patch  Morbid obesity: BMI of 35.1.  We recommend weight loss, healthy lifestyle, PCP follow-up           DVT prophylaxis:Pradaxa Code Status: Full Family Communication: None at bedside Status is: Inpatient  Remains inpatient appropriate because:Unsafe d/c plan  Dispo: The patient is from: Home              Anticipated d/c is to: CIR              Patient currently is medically stable for dc   Difficult to place patient No     Consultants: neurology  Procedures:EEG  Antimicrobials:  Anti-infectives (From admission, onward)    None       Subjective:  Patient seen and examined the bedside this morning.  Hemodynamically stable.  Denies any new complaints today.  Weakness on the left upper extremity has improved.   Objective: Vitals:   10/17/20 1744 10/17/20 2014 10/17/20 2310 10/18/20 0523  BP: (!) 148/68 (!) 168/68 (!) 151/67 (!) 164/80  Pulse: (!) 51 (!) 50 (!) 53 (!) 56  Resp: '18 14 14 18  '$ Temp: 98.9 F (37.2 C) 98.8 F (37.1 C) 98.4 F (36.9 C) 98.5 F (36.9 C)  TempSrc: Oral Oral Oral Oral  SpO2: 100% 100% 97% 99%  Weight:      Height:        Intake/Output Summary (Last 24 hours) at 10/18/2020 0810 Last data filed at 10/17/2020 1800 Gross per 24 hour  Intake 640 ml  Output 1100 ml  Net -460 ml   Filed Weights   10/14/20 0840  Weight: 95.7 kg  Examination:  General exam: Overall comfortable, not in distress HEENT: PERRL Respiratory system:  no wheezes or crackles  Cardiovascular system: S1 & S2 heard, RRR.  Gastrointestinal system: Abdomen is nondistended, soft and nontender. Central nervous system: Alert and oriented,left hemiparesis Extremities: No edema, no clubbing ,no cyanosis Skin: No rashes, no ulcers,no icterus       Data Reviewed: I have personally reviewed following labs and imaging  studies  CBC: Recent Labs  Lab 10/14/20 0913 10/14/20 0920 10/15/20 0500 10/16/20 0346  WBC  --  4.3 4.3 4.4  NEUTROABS  --  2.6 1.8  --   HGB 14.3 13.5 11.8* 11.8*  HCT 42.0 42.1 37.9* 36.7*  MCV  --  81.6 83.8 81.9  PLT  --  227 193 [id number]   Basic Metabolic Panel: Recent Labs  Lab 10/14/20 0913 10/14/20 0920 10/15/20 0500 10/16/20 0346  NA 140 137 135 134*  K 3.6 3.7 3.6 4.1  CL 104 104 102 104  CO2  --  '22 23 24  '$ GLUCOSE 118* 117* 86 104*  BUN '19 17 18 15  '$ CREATININE 1.20 1.30* 1.22 1.25*  CALCIUM  --  9.2 8.9 8.8*  MG  --   --  1.8  --    GFR: Estimated Creatinine Clearance: 57.7 mL/min (A) (by C-G formula based on SCr of 1.25 mg/dL (H)). Liver Function Tests: Recent Labs  Lab 10/14/20 0920 10/15/20 0500  AST 22 19  ALT 13 12  ALKPHOS 77 65  BILITOT 1.0 0.8  PROT 7.1 6.2*  ALBUMIN 3.6 3.0*   No results for input(s): LIPASE, AMYLASE in the last 168 hours. No results for input(s): AMMONIA in the last 168 hours. Coagulation Profile: Recent Labs  Lab 10/14/20 0920  INR 1.4*   Cardiac Enzymes: No results for input(s): CKTOTAL, CKMB, CKMBINDEX, TROPONINI in the last 168 hours. BNP (last 3 results) No results for input(s): PROBNP in the last 8760 hours. HbA1C: No results for input(s): HGBA1C in the last 72 hours. CBG: No results for input(s): GLUCAP in the last 168 hours. Lipid Profile: No results for input(s): CHOL, HDL, LDLCALC, TRIG, CHOLHDL, LDLDIRECT in the last 72 hours. Thyroid Function Tests: No results for input(s): TSH, T4TOTAL, FREET4, T3FREE, THYROIDAB in the last 72 hours. Anemia Panel: No results for input(s): VITAMINB12, FOLATE, FERRITIN, TIBC, IRON, RETICCTPCT in the last 72 hours. Sepsis Labs: No results for input(s): PROCALCITON, LATICACIDVEN in the last 168 hours.  Recent Results (from the past 240 hour(s))  Resp Panel by RT-PCR (Flu A&B, Covid) Nasopharyngeal Swab     Status: None   Collection Time: 10/14/20  7:06 PM   Specimen:  Nasopharyngeal Swab; Nasopharyngeal(NP) swabs in vial transport medium  Result Value Ref Range Status   SARS Coronavirus 2 by RT PCR NEGATIVE NEGATIVE Final    Comment: (NOTE) SARS-CoV-2 target nucleic acids are NOT DETECTED.  The SARS-CoV-2 RNA is generally detectable in upper respiratory specimens during the acute phase of infection. The lowest concentration of SARS-CoV-2 viral copies this assay can detect is 138 copies/mL. A negative result does not preclude SARS-Cov-2 infection and should not be used as the sole basis for treatment or other patient management decisions. A negative result may occur with  improper specimen collection/handling, submission of specimen other than nasopharyngeal swab, presence of viral mutation(s) within the areas targeted by this assay, and inadequate number of viral copies(<138 copies/mL). A negative result must be combined with clinical observations, patient history, and epidemiological information. The expected result is  Negative.  Fact Sheet for Patients:  EntrepreneurPulse.com.au  Fact Sheet for Healthcare Providers:  IncredibleEmployment.be  This test is no t yet approved or cleared by the [place] FDA and  has been authorized for detection and/or diagnosis of SARS-CoV-2 by FDA under an Emergency Use Authorization (EUA). This EUA will remain  in effect (meaning this test can be used) for the duration of the COVID-19 declaration under Section 564(b)(1) of the Act, 21 U.S.C.section 360bbb-3(b)(1), unless the authorization is terminated  or revoked sooner.       Influenza A by PCR NEGATIVE NEGATIVE Final   Influenza B by PCR NEGATIVE NEGATIVE Final    Comment: (NOTE) The Xpert Xpress SARS-CoV-2/FLU/RSV plus assay is intended as an aid in the diagnosis of influenza from Nasopharyngeal swab specimens and should not be used as a sole basis for treatment. Nasal washings and aspirates are unacceptable for  Xpert Xpress SARS-CoV-2/FLU/RSV testing.  Fact Sheet for Patients: EntrepreneurPulse.com.au  Fact Sheet for Healthcare Providers: IncredibleEmployment.be  This test is not yet approved or cleared by the [place] FDA and has been authorized for detection and/or diagnosis of SARS-CoV-2 by FDA under an Emergency Use Authorization (EUA). This EUA will remain in effect (meaning this test can be used) for the duration of the COVID-19 declaration under Section 564(b)(1) of the Act, 21 U.S.C. section 360bbb-3(b)(1), unless the authorization is terminated or revoked.  Performed at [facility], [place] [address], [place], [place] [address]          Radiology Studies: No results found.      Scheduled Meds:  atorvastatin  40 mg Oral Daily   finasteride  5 mg Oral Q0600   levETIRAcetam  1,000 mg Oral BID   psyllium  1 packet Oral Daily   Continuous Infusions:   LOS: 4 days    Time spent: 25 mins.More than 50% of that time was spent in counseling and/or coordination of care.      Shelly Coss, MD [facility] P8/15/2022, 8:10 AM

## 2020-10-18 NOTE — Progress Notes (Signed)
Inpatient Rehab Admissions Coordinator:  Note pt experienced functional decline with therapies today.  Will continue to follow for 1-2 more sessions to see if pts' mobility stabilizes. Would consider exploring other therapy venues in case pt's mobility does not stabilize resulting in him not being a candidate for CIR.     Gayland Curry, [place], [place] Admissions Coordinator [phone]

## 2020-10-19 ENCOUNTER — Inpatient Hospital Stay (HOSPITAL_COMMUNITY): Payer: No Typology Code available for payment source

## 2020-10-19 ENCOUNTER — Telehealth (HOSPITAL_COMMUNITY): Payer: Self-pay

## 2020-10-19 DIAGNOSIS — R531 Weakness: Secondary | ICD-10-CM | POA: Diagnosis not present

## 2020-10-19 NOTE — Plan of Care (Signed)
  Problem: Clinical Measurements: Goal: Will remain free from infection Outcome: Progressing Goal: Respiratory complications will improve Outcome: Progressing Goal: Cardiovascular complication will be avoided Outcome: Progressing   Problem: Coping: Goal: Level of anxiety will decrease Outcome: Progressing   Problem: Elimination: Goal: Will not experience complications related to bowel motility Outcome: Progressing Goal: Will not experience complications related to urinary retention Outcome: Progressing   Problem: Pain Managment: Goal: General experience of comfort will improve Outcome: Progressing   Problem: Safety: Goal: Ability to remain free from injury will improve Outcome: Progressing   Problem: Skin Integrity: Goal: Risk for impaired skin integrity will decrease Outcome: Progressing   Problem: Education: Goal: Individualized Educational Video(s) Outcome: Progressing   Problem: Coping: Goal: Will verbalize positive feelings about self Outcome: Progressing Goal: Will identify appropriate support needs Outcome: Progressing   Problem: Self-Care: Goal: Verbalization of feelings and concerns over difficulty with self-care will improve Outcome: Progressing   Problem: Nutrition: Goal: Risk of aspiration will decrease Outcome: Progressing   Problem: Intracerebral Hemorrhage Tissue Perfusion: Goal: Complications of Intracerebral Hemorrhage will be minimized Outcome: Progressing   Problem: Ischemic Stroke/TIA Tissue Perfusion: Goal: Complications of ischemic stroke/TIA will be minimized Outcome: Progressing   Problem: Spontaneous Subarachnoid Hemorrhage Tissue Perfusion: Goal: Complications of Spontaneous Subarachnoid Hemorrhage will be minimized Outcome: Progressing

## 2020-10-19 NOTE — Progress Notes (Signed)
Patient ID: Clifford [name], male   DOB: 04-05-1950, 70 y.o.   MRN: [id number] STROKE TEAM PROGRESS NOTE     SUBJECTIVE (INTERVAL HISTORY) Stroke team was asked to come back and follow this patient again as  In the early morning of 10/19/2020, patient was noticed to have increased left upper extremity weakness.  CT head follow-up showed evolving right posterior MCA territory infarct with increased cytotoxic edema, mass-effect.  Patient neurological exam did not change significantly in he was easily arousable and following commands.  Vital signs remained stable. OBJECTIVE Temp:  [98.2 F (36.8 C)-99 F (37.2 C)] 98.6 F (37 C) (08/16 1114) Pulse Rate:  [47-58] 51 (08/16 1114) Cardiac Rhythm: Junctional rhythm (08/16 0700) Resp:  [14-20] 16 (08/16 1114) BP: (131-167)/(62-76) 167/63 (08/16 1114) SpO2:  [99 %-100 %] 99 % (08/16 1114)  No results for input(s): GLUCAP in the last 168 hours. Recent Labs  Lab 10/14/20 0913 10/14/20 0920 10/15/20 0500 10/16/20 0346  NA 140 137 135 134*  K 3.6 3.7 3.6 4.1  CL 104 104 102 104  CO2  --  '22 23 24  '$ GLUCOSE 118* 117* 86 104*  BUN '19 17 18 15  '$ CREATININE 1.20 1.30* 1.22 1.25*  CALCIUM  --  9.2 8.9 8.8*  MG  --   --  1.8  --    Recent Labs  Lab 10/14/20 0920 10/15/20 0500  AST 22 19  ALT 13 12  ALKPHOS 77 65  BILITOT 1.0 0.8  PROT 7.1 6.2*  ALBUMIN 3.6 3.0*   Recent Labs  Lab 10/14/20 0913 10/14/20 0920 10/15/20 0500 10/16/20 0346  WBC  --  4.3 4.3 4.4  NEUTROABS  --  2.6 1.8  --   HGB 14.3 13.5 11.8* 11.8*  HCT 42.0 42.1 37.9* 36.7*  MCV  --  81.6 83.8 81.9  PLT  --  227 193 169   No results for input(s): CKTOTAL, CKMB, CKMBINDEX, TROPONINI in the last 168 hours. No results for input(s): LABPROT, INR in the last 72 hours.  No results for input(s): COLORURINE, LABSPEC, [place], GLUCOSEU, HGBUR, BILIRUBINUR, KETONESUR, PROTEINUR, UROBILINOGEN, NITRITE, LEUKOCYTESUR in the last 72 hours.  Invalid input(s): APPERANCEUR      Component Value Date/Time   CHOL 128 10/07/2020 0450   TRIG 63 10/07/2020 0450   HDL 40 (L) 10/07/2020 0450   CHOLHDL 3.2 10/07/2020 0450   VLDL 13 10/07/2020 0450   LDLCALC 75 10/07/2020 0450   Lab Results  Component Value Date   HGBA1C 5.9 (H) 10/07/2020   No results found for: LABOPIA, COCAINSCRNUR, LABBENZ, AMPHETMU, THCU, LABBARB  No results for input(s): ETH in the last 168 hours.  I have personally reviewed the radiological images below and agree with the radiology interpretations.  CT Angio Head W or Wo Contrast  Result Date: 10/06/2020 CLINICAL DATA:  Stroke. TIA. Assess intracranial arteries. Unsteady gait. Left-sided weakness. Left-sided visual loss. Headache. EXAM: CT ANGIOGRAPHY HEAD AND NECK TECHNIQUE: Multidetector CT imaging of the head and neck was performed using the standard protocol during bolus administration of intravenous contrast. Multiplanar CT image reconstructions and MIPs were obtained to evaluate the vascular anatomy. Carotid stenosis measurements (when applicable) are obtained utilizing NASCET criteria, using the distal internal carotid diameter as the denominator. CONTRAST:  172m OMNIPAQUE IOHEXOL 350 MG/ML SOLN COMPARISON:  Head CT earlier same day FINDINGS: CTA NECK FINDINGS Aortic arch: Aortic atherosclerosis.  Branching pattern is normal. Right carotid system: Common carotid artery is tortuous but widely patent to the  bifurcation. Carotid bifurcation is widely patent. Minimal calcified plaque at the distal ICA bulb but no stenosis. Cervical ICA widely patent. Left carotid system: Common carotid artery is tortuous but widely patent to the bifurcation. Carotid bifurcation is normal. Minimal calcified plaque at the ICA bulb but no stenosis. Cervical ICA widely patent beyond that. Vertebral arteries: Right vertebral artery origin is widely patent. Left vertebral artery origin is poorly seen because of regional venous reflux. Both vertebral arteries do appear patent  through the cervical region to the foramen magnum. Skeleton: Ordinary cervical spondylosis. Other neck: No mass or lymphadenopathy. Upper chest: Upper lungs are clear. Review of the MIP images confirms the above findings CTA HEAD FINDINGS Anterior circulation: Both internal carotid arteries are patent through the skull base and siphon regions. There is ordinary siphon atherosclerotic calcification. On the left, there is no stenosis. On the right, there is 50-70% stenosis in the siphon. Supraclinoid internal carotid arteries are widely patent. The anterior and middle cerebral vessels are patent. No large vessel occlusion, with particular attention to the right MCA. Posterior circulation: Both vertebral arteries are patent through the foramen magnum to the basilar. No basilar stenosis. Posterior circulation branch vessels are normal. Venous sinuses: Patent and normal. Anatomic variants: None significant. Review of the MIP images confirms the above findings IMPRESSION: No intracranial large vessel occlusion identified at this time, with specific attention to the right MCA branches. Minimal atherosclerotic change at both internal carotid artery bulbs. No stenosis. 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 No stenosis on the left. 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 Electronically Signed   By: Nelson Chimes M.D.   On: 10/06/2020 15:01   CT HEAD WO CONTRAST (5MM)  Result Date: 10/19/2020 CLINICAL DATA:  Stroke follow-up EXAM: CT HEAD WITHOUT CONTRAST TECHNIQUE: Contiguous axial images were obtained from the base of the skull through the vertex without intravenous contrast. COMPARISON:  None. FINDINGS: Brain: Evolving right posterior MCA territory infarct with increased area of cytotoxic edema now involving a greater portion of the temporal lobe. There is moderate mass effect on the right lateral ventricle. No hydrocephalus or entrapment. There is now leftward midline shift approximately 3 mm  but no herniation. No acute hemorrhage. Vascular: Atherosclerotic calcification of the internal carotid arteries at the skull base. No abnormal hyperdensity of the major intracranial arteries or dural venous sinuses. Skull: The visualized skull base, calvarium and extracranial soft tissues are normal. Sinuses/Orbits: No fluid levels or advanced mucosal thickening of the visualized paranasal sinuses. No mastoid or middle ear effusion. The orbits are normal. IMPRESSION: 1. Evolving right posterior MCA territory infarct with increased area of cytotoxic edema now involving a greater portion of the temporal lobe. 2. No acute hemorrhage. 3. Moderate mass effect on the right lateral ventricle with 3 mm leftward midline shift but no herniation. Electronically Signed   By: Ulyses Jarred M.D.   On: 10/19/2020 03:11   CT HEAD WO CONTRAST (5MM)  Result Date: 10/15/2020 CLINICAL DATA:  Neuro deficit, acute, stroke suspected. New focal defects. EXAM: CT HEAD WITHOUT CONTRAST TECHNIQUE: Contiguous axial images were obtained from the base of the skull through the vertex without intravenous contrast. COMPARISON:  Head CT and MRI yesterday. FINDINGS: Brain: Confluent acute infarction in the right posterior temporal and temporoparietal junction regions shows discrete low-density by CT, with minimal petechial blood products but no frank hematoma. Smaller areas cortical and subcortical infarction in the right frontal and frontoparietal cortex and underlying white matter are seen by  CT is subtle findings. No hemorrhage in that region. No new insult is identified. Mild swelling but no significant mass effect or any midline shift. No hydrocephalus. No extra-axial collection. Vascular: There is atherosclerotic calcification of the major vessels at the base of the brain. Skull: Negative Sinuses/Orbits: Clear/normal Other: None IMPRESSION: No apparent change since yesterday. Confluent infarction at the right posterior temporal and  temporoparietal junction region with petechial blood products but no frank hematoma. Smaller more subtle infarctions of the cortical and subcortical brain in the right frontal and frontoparietal region appear unchanged. Electronically Signed   By: Nelson Chimes M.D.   On: 10/15/2020 15:16   CT HEAD WO CONTRAST (5MM)  Result Date: 10/14/2020 CLINICAL DATA:  Right-sided headache, recent stroke EXAM: CT HEAD WITHOUT CONTRAST TECHNIQUE: Contiguous axial images were obtained from the base of the skull through the vertex without intravenous contrast. COMPARISON:  10/06/2020 CT head, 10/07/2020 MRI head FINDINGS: Brain: Area of hypodensity in the right parietal and occipital lobes, which correlates with the area of infarct seen on the 10/06/2020 CT and 10/07/2020 MRI, with edema in this area. Subtle areas of hyperdensity, likely petechial hemorrhage. No evidence of hemorrhagic transformation. No new areas of infarction. No significant midline shift. No mass or extra-axial collection. Vascular: No hyperdense vessel or unexpected calcification. Skull: Normal. Negative for fracture or focal lesion. Sinuses/Orbits: Mucosal thickening in the left frontal sinus and bilateral ethmoid air cells. The orbits are unremarkable. Other: None. IMPRESSION: Expected evolution of the previously noted right MCA/PCA territory infarct, with petechial hemorrhage and mild edema, without midline shift or hemorrhagic transformation. Electronically Signed   By: Merilyn Baba MD   On: 10/14/2020 10:02   CT HEAD WO CONTRAST (5MM)  Result Date: 10/06/2020 CLINICAL DATA:  Neuro deficit, acute, stroke suspected. Steady gait, left-sided weakness and left-sided vision loss, increased blood pressure, headache for 3 days, history of hypertension. EXAM: CT HEAD WITHOUT CONTRAST TECHNIQUE: Contiguous axial images were obtained from the base of the skull through the vertex without intravenous contrast. COMPARISON:  No pertinent prior exams available for  comparison. FINDINGS: Brain: Cerebral volume is normal for age. Region of abnormal cortical/subcortical hypodensity measuring 6.3 x 3.3 x 5.8 cm within the right parietooccipital lobes compatible with acute/early subacute infarction (right PCA vascular territory and right MCA/PCA watershed territory). No significant mass effect at this time. No evidence of hemorrhagic conversion. Chronic appearing lacunar infarct within the left lentiform nucleus. No extra-axial fluid collection. No evidence of an intracranial mass. No midline shift. Vascular: No hyperdense vessel.  Atherosclerotic calcifications. Skull: Normal. Negative for fracture or focal lesion. Sinuses/Orbits: Visualized orbits show no acute finding. Mild mucosal thickening within the frontal sinuses bilaterally. Mild-to-moderate mucosal thickening within the bilateral ethmoid air cells. These results were discussed by telephone at the time of interpretation on 10/06/2020 at 1:55 pm with provider MATTHEW TRIFAN , who verbally acknowledged these results. IMPRESSION: 6.3 x 3.3 x 5.8 cm acute/early subacute cortical and subcortical infarct within the right parietooccipital lobes (right PCA vascular territory and right MCA/PCA watershed territory). Consider MR or CT angiography for further evaluation. Chronic left basal ganglia lacunar infarct. Paranasal sinus disease at the imaged levels, as described. Electronically Signed   By: Kellie Simmering DO   On: 10/06/2020 14:13   CT Angio Neck W and/or Wo Contrast  Result Date: 10/06/2020 CLINICAL DATA:  Stroke. TIA. Assess intracranial arteries. Unsteady gait. Left-sided weakness. Left-sided visual loss. Headache. EXAM: CT ANGIOGRAPHY HEAD AND NECK TECHNIQUE: Multidetector CT imaging of  the head and neck was performed using the standard protocol during bolus administration of intravenous contrast. Multiplanar CT image reconstructions and MIPs were obtained to evaluate the vascular anatomy. Carotid stenosis measurements  (when applicable) are obtained utilizing NASCET criteria, using the distal internal carotid diameter as the denominator. CONTRAST:  127m OMNIPAQUE IOHEXOL 350 MG/ML SOLN COMPARISON:  Head CT earlier same day FINDINGS: CTA NECK FINDINGS Aortic arch: Aortic atherosclerosis.  Branching pattern is normal. Right carotid system: Common carotid artery is tortuous but widely patent to the bifurcation. Carotid bifurcation is widely patent. Minimal calcified plaque at the distal ICA bulb but no stenosis. Cervical ICA widely patent. Left carotid system: Common carotid artery is tortuous but widely patent to the bifurcation. Carotid bifurcation is normal. Minimal calcified plaque at the ICA bulb but no stenosis. Cervical ICA widely patent beyond that. Vertebral arteries: Right vertebral artery origin is widely patent. Left vertebral artery origin is poorly seen because of regional venous reflux. Both vertebral arteries do appear patent through the cervical region to the foramen magnum. Skeleton: Ordinary cervical spondylosis. Other neck: No mass or lymphadenopathy. Upper chest: Upper lungs are clear. Review of the MIP images confirms the above findings CTA HEAD FINDINGS Anterior circulation: Both internal carotid arteries are patent through the skull base and siphon regions. There is ordinary siphon atherosclerotic calcification. On the left, there is no stenosis. On the right, there is 50-70% stenosis in the siphon. Supraclinoid internal carotid arteries are widely patent. The anterior and middle cerebral vessels are patent. No large vessel occlusion, with particular attention to the right MCA. Posterior circulation: Both vertebral arteries are patent through the foramen magnum to the basilar. No basilar stenosis. Posterior circulation branch vessels are normal. Venous sinuses: Patent and normal. Anatomic variants: None significant. Review of the MIP images confirms the above findings IMPRESSION: No intracranial large vessel  occlusion identified at this time, with specific attention to the right MCA branches. Minimal atherosclerotic change at both internal carotid artery bulbs. No stenosis. 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 No stenosis on the left. 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 Electronically Signed   By: MNelson ChimesM.D.   On: 10/06/2020 15:01   MR BRAIN WO CONTRAST  Result Date: 10/14/2020 CLINICAL DATA:  Stroke, follow up.  Eval for progression of stroke. EXAM: MRI HEAD WITHOUT CONTRAST TECHNIQUE: Multiplanar, multiecho pulse sequences of the brain and surrounding structures were obtained without intravenous contrast. COMPARISON:  Head CT 10/14/2020 and MRI 10/07/2020 FINDINGS: The study is intermittently motion degraded including severe motion on the coronal T2 sequence. Brain: The moderately large infarct involving the posterior right MCA territory and PCA border zone has mildly expanded compared to the prior MRI, most notable anteriorly and superiorly. Associated petechial hemorrhage is partially confluent and has increased from the prior MRI. There is progressive cytotoxic edema with regional sulcal effacement and mass effect on the right lateral ventricle. New mildly restricted diffusion in the splenium of the corpus callosum may reflect acute wallerian degeneration. Small cortical and subcortical infarcts in the right frontal lobe (border zone distribution) have also mildly increased from the prior MRI. Small T2 hyperintensities elsewhere in the cerebral white matter bilaterally are similar to the prior MRI and are nonspecific but compatible with mild chronic small vessel ischemic disease. There is no midline shift or extra-axial fluid collection. A chronic hemorrhagic infarct is again noted in the posterior aspect of the left lentiform nucleus/external capsule. Vascular: Major intracranial vascular flow voids are preserved.  Skull and upper cervical spine: Unremarkable bone marrow  signal para Sinuses/Orbits: Unremarkable orbits. Mild mucosal thickening in the paranasal sinuses. Clear mastoid air cells. Other: None. IMPRESSION: 1. Mild enlargement of the right MCA/border zone infarct since the 10/07/2020 MRI. Increased cytotoxic edema and petechial hemorrhage without midline shift. 2. Chronic small-vessel ischemia including an old hemorrhagic infarct in the left basal ganglia. Electronically Signed   By: Logan Bores M.D.   On: 10/14/2020 18:22   MR BRAIN WO CONTRAST  Result Date: 10/07/2020 CLINICAL DATA:  Neuro deficit, acute, stroke suspected. Additional history provided: Vision change and mild left-sided weakness which began 2 days ago. EXAM: MRI HEAD WITHOUT CONTRAST TECHNIQUE: Multiplanar, multiecho pulse sequences of the brain and surrounding structures were obtained without intravenous contrast. COMPARISON:  Noncontrast head CT 10/06/2020. CT angiogram head/neck 10/06/2020. FINDINGS: Brain: Cerebral volume is normal. Region of abnormal cortical/subcortical restricted diffusion within the right parietal and occipital lobes measuring 6.2 x 3.3 x 6.4 cm compatible with acute/early subacute infarction (at the junction of the right MCA and PCA vascular territories). This has not significantly changed in extent as compared to the head CT performed yesterday 10/06/2020. Mild T1 hyperintensity and SWI signal loss within the infarction territory compatible with petechial hemorrhage and possibly cortical laminar necrosis. No significant mass effect at this time. There are numerous additional small patchy cortical and subcortical acute/early subacute infarcts within the right frontal and parietal lobes (watershed distribution). Background mild multifocal T2/FLAIR hyperintensity within the cerebral white matter, nonspecific but compatible with chronic small vessel ischemic disease. Chronic lacunar infarct with associated chronic hemosiderin deposition within the left basal ganglia/posterior  limb of left internal capsule. No evidence of an intracranial mass. No extra-axial fluid collection. No midline shift. Vascular: Maintained proximal arterial flow voids. Skull and upper cervical spine: No focal suspicious marrow lesion. Incompletely assessed cervical spondylosis. Sinuses/Orbits: Visualized orbits show no acute finding. 11 mm left maxillary sinus mucous retention cyst. Moderate bilateral ethmoid sinus mucosal thickening. Trace mucosal thickening within the left frontal sinus. IMPRESSION: 6.2 x 6.4 cm acute/early subacute cortical and subcortical infarction within the right parietal and occipital lobes (at the junction of the right MCA and PCA territories). The infarct has not significantly changed in extent as compared to the head CT of 10/06/2020. Mild petechial hemorrhage within the infarction territory. No significant mass effect at this time. Numerous additional small patchy cortical and subcortical acute/early subacute infarcts within the right frontal and parietal lobes (watershed distribution). 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 Background mild chronic small vessel ischemic changes within the cerebral white matter. Paranasal sinus disease at the imaged levels, as described. Electronically Signed   By: Kellie Simmering DO   On: 10/07/2020 07:57   EEG adult  Result Date: 10/15/2020 Lora Havens, MD     10/15/2020 10:31 AM Patient Name: Clifford [name] MRN: [id number] Epilepsy Attending: Lora Havens Referring Physician/Provider: Dr Lesleigh Noe Date: 10/15/2020 Duration: 23.02 mins Patient history: 70 year old male with posterior right parietal watershed infarct who presented with worsening stroke symptoms as well as left thumb rhythmic and nonsuppressible shaking movements.  EEG to evaluate for seizures. Level of alertness: Awake, asleep AEDs during EEG study: LEV Technical aspects: This EEG study was done with scalp  electrodes positioned according to the 10-20 International system of electrode placement. Electrical activity was acquired at a sampling rate of '500Hz'$  and reviewed with a high frequency filter of '70Hz'$  and a low frequency  filter of '1Hz'$ . EEG data were recorded continuously and digitally stored. Description: The posterior dominant rhythm consists of 8-9 Hz activity of moderate voltage (25-35 uV) seen predominantly in posterior head regions, asymmetric ( R<L) and reactive to eye opening and eye closing. Sleep was characterized by vertex waves, sleep spindles (12 to 14 Hz), maximal frontocentral region.  EEG showed continuous rhythmic 3 to 5 Hz theta-delta slowing in right hemisphere. Hyperventilation and photic stimulation were not performed.   ABNORMALITY - Continuous slow, right hemisphere - Background asymmetry, right<left IMPRESSION: This study is suggestive of cortical dysfunction in right hemisphere, likely secondary to underlying stroke.  No seizures or epileptiform discharges were seen throughout the recording. Priyanka Barbra Sarks   Overnight EEG with video  Result Date: 10/16/2020 Lora Havens, MD     10/16/2020  3:23 PM Patient Name: Clifford [name] MRN: [id number] Epilepsy Attending: Lora Havens Referring Physician/Provider: Dr Rosalin Hawking Duration: 10/15/2020 1639 to 10/16/2020 0230  Patient history: 70 year old male with posterior right parietal watershed infarct who presented with worsening stroke symptoms as well as left thumb rhythmic and nonsuppressible shaking movements.  EEG to evaluate for seizures.  Level of alertness: Awake, asleep  AEDs during EEG study: LEV  Technical aspects: This EEG study was done with scalp electrodes positioned according to the 10-20 International system of electrode placement. Electrical activity was acquired at a sampling rate of '500Hz'$  and reviewed with a high frequency filter of '70Hz'$  and a low frequency filter of '1Hz'$ . EEG data were recorded continuously and digitally  stored.  Description: The posterior dominant rhythm consists of 8-9 Hz activity of moderate voltage (25-35 uV) seen predominantly in posterior head regions, asymmetric ( R<L) and reactive to eye opening and eye closing. Sleep was characterized by vertex waves, sleep spindles (12 to 14 Hz), maximal frontocentral region.  EEG showed continuous rhythmic 3 to 5 Hz theta-delta slowing in right hemisphere. Hyperventilation and photic stimulation were not performed. EEG was not interpretable after 0230 on 10/16/2020 due to significant electrode artifact.   ABNORMALITY - Continuous slow, right hemisphere - Background asymmetry, right<left  IMPRESSION: This study is suggestive of cortical dysfunction in right hemisphere, likely secondary to underlying stroke.  No seizures or epileptiform discharges were seen throughout the recording.  Lora Havens   ECHOCARDIOGRAM COMPLETE  Result Date: 10/07/2020    ECHOCARDIOGRAM REPORT   Patient Name:   ZIYAD STINCHCOMB Date of Exam: 10/07/2020 Medical Rec #:  [id number]    Height:       65.0 in Accession #:    [id number]   Weight: Date of Birth:  [birth year]     BSA: Patient Age:    89 years     BP:           115/58 mmHg Patient Gender: M            HR:           66 bpm. Exam Location:  Inpatient Procedure: 2D Echo, Cardiac Doppler and Color Doppler Indications:    Stroke I63.9  History:        Patient has no prior history of Echocardiogram examinations.                 Risk Factors:Hypertension and Dyslipidemia. PVD. PE.  Sonographer:    Vickie Epley RDCS Referring Phys: [place]  1. Left ventricular ejection fraction, by estimation, is 65 to 70%. The left ventricle has hyperdynamic function. The left ventricle has  no regional wall motion abnormalities. Left ventricular diastolic parameters are consistent with Grade I diastolic dysfunction (impaired relaxation).  2. Right ventricular systolic function is normal. The right ventricular size is normal. Tricuspid  regurgitation signal is inadequate for assessing PA pressure.  3. The mitral valve is normal in structure. No evidence of mitral valve regurgitation. No evidence of mitral stenosis.  4. The aortic valve is tricuspid. Aortic valve regurgitation is not visualized. No aortic stenosis is present.  5. The inferior vena cava is normal in size with greater than 50% respiratory variability, suggesting right atrial pressure of 3 mmHg. FINDINGS  Left Ventricle: Left ventricular ejection fraction, by estimation, is 65 to 70%. The left ventricle has hyperdynamic function. The left ventricle has no regional wall motion abnormalities. The left ventricular internal cavity size was normal in size. There is no left ventricular hypertrophy. Left ventricular diastolic parameters are consistent with Grade I diastolic dysfunction (impaired relaxation). Right Ventricle: The right ventricular size is normal. No increase in right ventricular wall thickness. Right ventricular systolic function is normal. Tricuspid regurgitation signal is inadequate for assessing PA pressure. Left Atrium: Left atrial size was normal in size. Right Atrium: Right atrial size was normal in size. Pericardium: There is no evidence of pericardial effusion. Mitral Valve: The mitral valve is normal in structure. No evidence of mitral valve regurgitation. No evidence of mitral valve stenosis. Tricuspid Valve: The tricuspid valve is normal in structure. Tricuspid valve regurgitation is not demonstrated. Aortic Valve: The aortic valve is tricuspid. Aortic valve regurgitation is not visualized. No aortic stenosis is present. Pulmonic Valve: The pulmonic valve was normal in structure. Pulmonic valve regurgitation is not visualized. Aorta: The aortic root is normal in size and structure. Venous: The inferior vena cava is normal in size with greater than 50% respiratory variability, suggesting right atrial pressure of 3 mmHg. IAS/Shunts: No atrial level shunt detected by  color flow Doppler.  LEFT VENTRICLE PLAX 2D LVIDd:         3.60 cm      Diastology LVIDs:         2.50 cm      LV e' medial:    6.00 cm/s LV PW:         1.00 cm      LV E/e' medial:  7.4 LV IVS:        1.10 cm      LV e' lateral:   7.62 cm/s LVOT diam:     2.20 cm      LV E/e' lateral: 5.8 LV SV:         97 LVOT Area:     3.80 cm  LV Volumes (MOD) LV vol d, MOD A2C: 98.1 ml LV vol d, MOD A4C: 108.0 ml LV vol s, MOD A2C: 39.6 ml LV vol s, MOD A4C: 40.2 ml LV SV MOD A2C:     58.5 ml LV SV MOD A4C:     108.0 ml LV SV MOD BP:      64.0 ml RIGHT VENTRICLE RV S prime:     14.80 cm/s TAPSE (M-mode): 2.3 cm LEFT ATRIUM           RIGHT ATRIUM LA diam:      3.20 cm RA Area:     14.70 cm LA Vol (A2C): 31.0 ml RA Volume:   36.60 ml LA Vol (A4C): 24.7 ml  AORTIC VALVE LVOT Vmax:   129.00 cm/s LVOT Vmean:  96.600 cm/s LVOT VTI:  0.254 m  AORTA Ao Root diam: 3.50 cm Ao Asc diam:  3.50 cm MITRAL VALVE MV Area (PHT): 2.41 cm    SHUNTS MV Decel Time: 315 msec    Systemic VTI:  0.25 m MV E velocity: 44.50 cm/s  Systemic Diam: 2.20 cm MV A velocity: 65.60 cm/s MV E/A ratio:  0.68 Loralie Champagne MD Electronically signed by Loralie Champagne MD Signature Date/Time: 10/07/2020/5:59:38 PM    Final     TTE  IMPRESSIONS:  1. Left ventricular ejection fraction, by estimation, is 65 to 70%. The  left ventricle has hyperdynamic function. The left ventricle has no  regional wall motion abnormalities. Left ventricular diastolic parameters  are consistent with Grade I diastolic  dysfunction (impaired relaxation).   2. Right ventricular systolic function is normal. The right ventricular  size is normal. Tricuspid regurgitation signal is inadequate for assessing  PA pressure.   3. The mitral valve is normal in structure. No evidence of mitral valve  regurgitation. No evidence of mitral stenosis.   4. The aortic valve is tricuspid. Aortic valve regurgitation is not  visualized. No aortic stenosis is present.   5. The inferior vena cava  is normal in size with greater than 50%  respiratory variability, suggesting right atrial pressure of 3 mmHg.   EEG: IMPRESSION: This study is suggestive of cortical dysfunction in right hemisphere, likely secondary to underlying stroke.  No seizures or epileptiform discharges were seen throughout the recording.    PHYSICAL EXAM  Temp:  [98.2 F (36.8 C)-99 F (37.2 C)] 98.6 F (37 C) (08/16 1114) Pulse Rate:  [47-58] 51 (08/16 1114) Resp:  [14-20] 16 (08/16 1114) BP: (131-167)/(62-76) 167/63 (08/16 1114) SpO2:  [99 %-100 %] 99 % (08/16 1114)  General -obese elderly African-American male in no apparent distress.  Ophthalmologic - fundi not visualized due to noncooperation.  Cardiovascular - Regular rhythm and rate.  Mental Status -  Patient is drowsy with eyes closed but easily arousable and follows commands well.  He is oriented to time place and person.  Speech and language is intact.  Cranial Nerves II - XII - II - Visual field intact OU. III, IV, VI - Extraocular movements intact. V - Facial sensation intact bilaterally. VII - Facial movement intact bilaterally. VIII - Hearing & vestibular intact bilaterally. X - Palate elevates symmetrically. XII - Tongue protrusion intact.  Motor Strength - The patient's strength was normal 5/5 right hemibody.  Left hemiparesis.  With 0/5 strength.  Reflexes - The patient's reflexes were symmetrical in all extremities and he had no pathological reflexes.  Sensory - Light touch intact b/l.  Mild inattention on the left.  Coordination - no twitching noted today. Normal tone.   Gait and Station - deferred.   ASSESSMENT/PLAN Clifford [name] is a 70 y.o. 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 no tPA given due to risk of bleeding  Increased petechial hemorrhage/evolution of previous right MCA/PCA territory infarct with mild edema with mild 3 mm midline shift on repeat CT 10/19/2020  MRI mild enlargement with cytotoxic edema and petechial hemorrhage without midline shift compared to 8/4 2D Echo grade 1 diastolic dysfunction, EF 65 to 70% LDL  75 HgbA1c 5.9% SCDs for VTE prophylaxis Pradaxa (dabigatran) twice a day prior to admission, now on No antithrombotic.  Therapy recommendations:  pending Disposition:  pending  LUE non suppressible twitching/weakness, concern for focal seizure activity LTM right hemispheric slowing but no definite epileptiform activity  continue keppra. Since he is stable, no need for additional AED. Continue to monitor.  Hypertension Stable SBP goal 130-150 Long term BP goal normotensive  Hyperlipidemia Home meds:  lipitor 80 mg  LDL 75, goal < 70 on lipitor 40 mg Continue statin at discharge  Other Stroke Risk Factors Advanced age Cigarette smoker, advised to stop smoking Obesity, Body mass index is 35.11 kg/m.  Hx stroke/TIA  Hospital day # 5 Patient had slightly neurological worsening earlier this morning and repeat CT scan shows slightly increased cytotoxic edema.  Recommend continue close observation for now no need for hypertonic saline further decline in his exam and worsening edema.  Repeat CT scan of the head this afternoon.  Continue Pradaxa for secondary stroke prevention given history of multiple pulmonary emboli in the past.  Continue Keppra for seizures since he is stable no need for additional seizure medications.  Discussed with Dr. Tamsen Meek.     Greater than 50% time during this 35-minute visit was spent on counseling and coordination of care and discussion with care team.  Antony Contras MD   To  contact Stroke Continuity provider, please refer to [email or web address] After hours, contact General Neurology

## 2020-10-19 NOTE — Progress Notes (Signed)
Dr did call back and said she would order a stat head CT.  She also noted LUE weakness from neurologist note from 10/18/20.  Pl is resting at this time.  Does deny pain or complaints.  Said he just does not understand why his arm won't work.

## 2020-10-19 NOTE — Plan of Care (Signed)
  Problem: Self-Care: Goal: Ability to participate in self-care as condition permits will improve Outcome: Progressing Goal: Verbalization of feelings and concerns over difficulty with self-care will improve Outcome: Progressing Goal: Ability to communicate needs accurately will improve Outcome: Progressing   Problem: Nutrition: Goal: Risk of aspiration will decrease Outcome: Progressing Goal: Dietary intake will improve Outcome: Progressing

## 2020-10-19 NOTE — Evaluation (Addendum)
Clinical/Bedside Swallow Evaluation Patient Details  Name: Clifford [name] MRN: [id number] Date of Birth: 21-Nov-1950  Today's Date: 10/19/2020 Time: SLP Start Time (ACUTE ONLY): 1400 SLP Stop Time (ACUTE ONLY): 1435 SLP Time Calculation (min) (ACUTE ONLY): 35 min  Past Medical History:  Past Medical History:  Diagnosis Date   BPH (benign prostatic hyperplasia)    Chronic anticoagulation 10/14/2020   Essential hypertension 10/06/2020   History of pulmonary embolism 10/14/2020   HLD (hyperlipidemia)    HTN (hypertension)    Lung nodule    39m in 2015   [place]    Mixed hyperlipidemia 10/06/2020   Nicotine dependence, cigarettes, uncomplicated [id number]  [place]ipheral vascular [place]    Polyp of colon    Prediabetes 10/14/2020   [place]    [place] 10/06/2020   Past Surgical History:  Past Surgical History:  Procedure Laterality Date   COLONOSCOPY  03/2017   PARTIAL NEPHRECTOMY Left 02/2018   PROSTATE BIOPSY  2015   HPI:  89yomale admitted 10/14/20 with right side headache and LUE weakness. 10/19/20 pt was noted to have increased LUE weakness. 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 CTHead = evolving RMCA infarct with cytotoxic edema, mass effect   Assessment / Plan / Recommendation Clinical Impression  Pt seen at bedside for swallow evaluation following evolution of CVA. Pt now presents with reduced alertness, and kept his eyes closed throughout almost the entire evaluation. Left orofacial weakness is evident, and speech is severely dysarthric. Volitional cough is significantly weak despite encouragement. Lingual tremors noted during oral motor examination. Trials of ice chips, puree, and nectar thick liquid were tolerated without overt s/s aspiration. He exhibits poor labial closure and oral residue after the swallow. Thin liquids resulted in a strong reflexive  cough. Dis-coordinated oral prep, holding, and residue noted on solid trials. At this time, recommend puree diet and nectar thick liquids, crushed meds. [facility] swallow precautions [facility] RN and MD informed. SLP will continue to follow to assess diet tolerance, readiness to advance textures and/or complete instrumental study. Suction is recommended to facilitate effective oral care.  SLP Visit Diagnosis: Dysphagia, unspecified (R13.10)    Aspiration Risk  Moderate aspiration risk    Diet Recommendation Dysphagia 1 (Puree);Nectar-thick liquid   Liquid Administration via: Straw Medication Administration: Crushed with puree Supervision: Full supervision/cueing for compensatory strategies;Staff to assist with self feeding Compensations: Minimize environmental distractions;Slow rate;Small sips/bites  Postural Changes: Remain upright for at least 30 minutes after po intake;Seated upright at 90 degrees    Other  Recommendations Oral Care Recommendations: Oral care QID Other Recommendations: Order thickener from pharmacy;Have oral suction available   Follow up Recommendations Other (comment) (TBD)      Frequency and Duration min 2x/week  1 week;2 weeks       Prognosis Prognosis for Safe Diet Advancement: Fair Barriers to Reach Goals: Cognitive deficits      Swallow Study   General Date of Onset: 10/14/20 HPI: [id number] admitted 10/14/20 with right side headache and LUE weakness. 10/19/20 pt was noted to have increased LUE weakness. 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 CTHead = evolving RMCA infarct with cytotoxic edema, mass effect Type of Study: Bedside Swallow Evaluation Previous Swallow Assessment: none found Diet Prior to this Study: Regular;Thin liquids Temperature Spikes Noted: No Respiratory  Status: Room air History of Recent Intubation: No Behavior/Cognition: Cooperative;Requires  cueing;Lethargic/Drowsy Oral Cavity Assessment: Within Functional Limits Oral Care Completed by SLP: No Oral Cavity - Dentition: Edentulous Self-Feeding Abilities: Total assist Patient Positioning: Upright in bed Baseline Vocal Quality: Low vocal intensity Volitional Cough: Weak Volitional Swallow: Able to elicit    Oral/Motor/Sensory Function Overall Oral Motor/Sensory Function: Moderate impairment Facial ROM: Reduced left Facial Symmetry: Abnormal symmetry left Facial Strength: Reduced left Lingual ROM: Reduced left Lingual Symmetry: Abnormal symmetry left Lingual Strength: Reduced Mandible: Within Functional Limits   Ice Chips Ice chips: Within functional limits Presentation: Spoon   Thin Liquid Thin Liquid: Impaired Presentation: Straw Oral Phase Impairments: Reduced labial seal Oral Phase Functional Implications: Oral holding Pharyngeal  Phase Impairments: Cough - Immediate    Nectar Thick Nectar Thick Liquid: Within functional limits Presentation: Straw   Honey Thick Honey Thick Liquid: Not tested   Puree Puree: Impaired Presentation: Spoon Oral Phase Impairments: Poor awareness of bolus Oral Phase Functional Implications: Oral holding;Prolonged oral transit   Solid     Solid: Impaired Oral Phase Impairments: Impaired mastication;Poor awareness of bolus;Reduced lingual movement/coordination Oral Phase Functional Implications: Prolonged oral transit;Oral residue     Clifford [name], [facility], [place] Speech Language Pathologist Office: [phone]  Clifford [name] 10/19/2020,2:46 PM

## 2020-10-19 NOTE — Progress Notes (Signed)
Physical Therapy Treatment Patient Details Name: Clifford [name] MRN: [id number] DOB: 01/15/1951 Today's Date: 10/19/2020    History of Present Illness 70 yo male presented to ED on 8/11 for R sided headache and worsening L UE weakness. Patient recently admitted 8/3-8/5 for R CVA with L visual deficits and L sided weakness. MRI with mild enlargement of R MCA/border zone infarct since 10/07/20 MRI and increased cytotoxic edema and petechial hemorrhage without midline shift. PMH: HTN, HLD, hx of PE on coumadin, CVA 10/2020    PT Comments    Pt continues to decline functionally from session yesterday. Pt with slurred speech, very difficult to understand and with minimal verbalization and eyes closed much of session. Pt required max A +2 to come to EOB and max A to maintain sitting EOB. Pt with dense hemiparesis LUE/ LLE and some pushing with RUE/ RLE. Pt stood to stedy with tot A +2 and required max A to maintain sitting in stedy. Taken to mirror as this was very effective in helping him gain midline past 2 sessions but not as effective today. Spoke with pt's RN about steady decline seen over past 3 sessions. Changing d/c rec to SNF as pt not currently able to tolerate CIR level therapies. PT will continue to follow.    Follow Up Recommendations  SNF;Supervision/Assistance - 24 hour     Equipment Recommendations  Other (comment) (TBD)    Recommendations for Other Services       Precautions / Restrictions Precautions Precautions: Fall Precaution Comments: L visual field deficit, L inattention Restrictions Weight Bearing Restrictions: No    Mobility  Bed Mobility Overal bed mobility: Needs Assistance Bed Mobility: Supine to Sit     Supine to sit: Max assist;+2 for physical assistance     General bed mobility comments: pt with dense hemiparesis L side at this point and some pushing with R side. Max A for LE's off EOB and elevation of trunk into sitting    Transfers Overall transfer  level: Needs assistance Equipment used: Ambulation equipment used Transfers: Sit to/from [facility] Sit to Stand: +2 physical assistance;Total assist Stand pivot transfers: +2 physical assistance;Total assist       General transfer comment: tot A +2 for sit>stand due to heavy L lean. Worked on sit>stand from flaps of stedy 3x with max A +2 due to L lean. Tot A in stedy to pivot to recliner  Ambulation/Gait             General Gait Details: unable   Stairs             Wheelchair Mobility    Modified Rankin (Stroke Patients Only) Modified Rankin (Stroke Patients Only) Pre-Morbid Rankin Score: Moderately severe disability Modified Rankin: Severe disability     Balance Overall balance assessment: Needs assistance Sitting-balance support: Single extremity supported;Feet supported Sitting balance-Leahy Scale: Zero Sitting balance - Comments: max A needed to maintain sitting EOB and sitting on flaps of stedy Postural control: Left lateral lean Standing balance support: Single extremity supported Standing balance-Leahy Scale: Zero Standing balance comment: heavy L lean in standing. Mirror less helpful in maintaining midline than it was yesterday                            Cognition Arousal/Alertness: Lethargic Behavior During Therapy: Flat affect Overall Cognitive Status: Impaired/Different from baseline Area of Impairment: Problem solving;Awareness;Memory;Attention;Following commands;Safety/judgement  Current Attention Level: Sustained Memory: Decreased short-term memory Following Commands: Follows one step commands inconsistently Safety/Judgement: Decreased awareness of safety;Decreased awareness of deficits Awareness: Emergent Problem Solving: Slow processing;Requires verbal cues General Comments: pt's cognition has steadily declined through past 3 PT visits. Today pt with slurred speech, difficult to  understand. Minimal verbalization and decreased engagement in session      Exercises      General Comments General comments (skin integrity, edema, etc.): Spoke with RN about decline in pt seen over last 3 visits.      Pertinent Vitals/Pain Pain Assessment: Faces Faces Pain Scale: Hurts even more Pain Location: head Pain Descriptors / Indicators: Headache Pain Intervention(s): Limited activity within patient's tolerance;Monitored during session;Patient requesting pain meds-RN notified    Home Living                      Prior Function            PT Goals (current goals can now be found in the care plan section) Acute Rehab PT Goals Patient Stated Goal: none stated, pt with minimal verbalization PT Goal Formulation: With patient Time For Goal Achievement: 10/29/20 Potential to Achieve Goals: Fair Progress towards PT goals: Not progressing toward goals - comment    Frequency    Min 3X/week      PT Plan Discharge plan needs to be updated;Frequency needs to be updated    Co-evaluation              AM-PAC PT "6 Clicks" Mobility   Outcome Measure  Help needed turning from your back to your side while in a flat bed without using bedrails?: Total Help needed moving from lying on your back to sitting on the side of a flat bed without using bedrails?: Total Help needed moving to and from a bed to a chair (including a wheelchair)?: Total Help needed standing up from a chair using your arms (e.g., wheelchair or bedside chair)?: Total Help needed to walk in hospital room?: Total Help needed climbing 3-5 steps with a railing? : Total 6 Click Score: 6    End of Session Equipment Utilized During Treatment: Gait belt Activity Tolerance: Patient limited by lethargy Patient left: with call [place]/phone within reach;in chair;with chair alarm set Nurse Communication: Mobility status;Need for lift equipment PT Visit Diagnosis: Unsteadiness on feet (R26.81);Muscle  weakness (generalized) (M62.81);Other symptoms and signs involving the nervous system (R29.898);Other abnormalities of gait and mobility (R26.89)     Time: [id number] PT Time Calculation (min) (ACUTE ONLY): 18 min  Charges:  $Therapeutic Activity: 8-22 mins                     [place]  Pager [phone] Office [place] 10/19/2020, 1:39 PM

## 2020-10-19 NOTE — Progress Notes (Addendum)
Received a call from bedside RN regarding patient's change regarding left upper extremity weakness.  Patient was restarted on home Pradaxa twice daily on 10/18/2020.  Reviewed MRI brain and CT scan brain done on 10/14/2020 and 10/15/2020 respectively, showed moderately large infarct involving the posterior right MCA territory, chronic small vessel ischemia including an old hemorrhagic infarct in the left basal ganglia.  CT head without contrast ordered to rule out acute intracranial hemorrhage.  We will continue to closely monitor and treat as indicated.  Addendum: His stroke has expanded to the R temporal lobe.  Discussed with neurology Dr. Lorrin Goodell.  Per neurology, Decadron would not be useful in the setting of cytotoxic edema.  No need for hypertonic saline at this time.    We will continue to closely monitor and treat as indicated.

## 2020-10-19 NOTE — Progress Notes (Signed)
Noted to have limited movement to his left arm and has neglect as well.  This is a slight change from assessment from yesterday.  Did page MD in [place] to note.  He does answer questions and is oriented x 4 but he has limited movement to his LUE and has some left side neglect noted as well.  VSS.  His daughter did call and said her uncle called her and told her pt is getting worse.  This nurse did inform her that he is still A&O x4 and that he does answer questions and is talking and following commands.  I did take the phone to him so he could talk to him and hear for herself.  He did talk to her and answer questions and he was able to identify her and told me her name, as well as identified his family members.

## 2020-10-19 NOTE — Progress Notes (Signed)
Inpatient Rehabilitation Admissions Coordinator   I met at bedside with patient's daughter and brother, Marland Kitchen. Patient not opening his eyes. I discussed that we are observing is ability to participate with therapies to determine if Cir vs SNF will be recommended.   Danne Baxter, RN, MSN Rehab Admissions Coordinator [phone] 10/19/2020 1:24 PM

## 2020-10-19 NOTE — Telephone Encounter (Signed)
Transitions of Care Pharmacy  ° °Call attempted for a pharmacy transitions of care follow-up. HIPAA appropriate voicemail was left with call back information provided.  ° °Call attempt #1. Will follow-up in 2-3 days.  °  °

## 2020-10-19 NOTE — Progress Notes (Signed)
PROGRESS NOTE    Clifford [name]  [id number] DOB: 1950-11-06 DOA: 10/14/2020 PCP: Pcp, No   Chief Complain:AMS  Brief Narrative:  Patient is a 70 year old male with history of diastolic congestive heart failure, hypertension, hyperlipidemia, PE on anticoagulation, recent right MCA/PCA watershed ischemic stroke who presented with increasing left upper arm weakness, left visual defect.  MRI done on presentation showed evolving/increased size of stroke, petechial hemorrhages, cytotoxic edema, concern for new seizures.  Neurology was consulted and were following.  In the early morning of 10/19/2020, patient was noticed to have increased left upper extremity weakness.  CT head follow-up showed evolving right posterior MCA territory infarct with increased cytotoxic edema, mass-effect.  Neurology re-consulted.Eventual plan is to discharge to CIR as per PT/OT   Assessment & Plan:   Principal Problem:   Left-sided weakness Active Problems:   Stroke [facility])   Essential hypertension   Mixed hyperlipidemia   Prediabetes   History of pulmonary embolism   Chronic anticoagulation   Nicotine dependence, cigarettes, uncomplicated   Petechial hemorrhage   Acute stroke due to ischemia [facility])   Left-sided weakness: Secondary to worsening right parietal occipital MCA/PCA watershed stroke complicated by worsening petechial hemorrhage.  MRI of the brain reviewed.  Pradaxa was held now restarted.  Repeat CT head on 8/12 did not show any apparent change.  Neurology were following.  His left-sided weakness was improving.  In the early morning of 10/19/2020, patient was noticed to have increased left upper extremity weakness.  CT head follow-up showed evolving right posterior MCA territory infarct with increased cytotoxic edema, mass-effect.  Neurology re-consulted.  Now he has dense hemiplegia on the left side Eventual plan is to discharge to CIR as per PT/OT   Concern for seizure: Likely precipitated in the  setting of worsening stroke with petechial hemorrhage.  EEG showed cortical dysfunction , no epileptiform discharges.  Currently on Keppra.  Hypertension: Continue monitoring blood pressure.   On hydrochlorothiazide and losartan at home.  Losartan resumed on low-dose  Sinus bradycardia: Asymptomatic.  Continue to monitor.  Hyperlipidemia: On Lipitor.  Recent LDL of 75.  Prediabetes: Monitor blood sugars.  Carbohydrate consistent diet.  History of multiple pulmonary embolism: On lifelong anticoagulation.  Takes Pradaxa at home which was held due to finding of petechial hemorrhage,now restarted  Tobacco abuse: Counseled cessation.  Declined nicotine patch  Morbid obesity: BMI of 35.1.  We recommend weight loss, healthy lifestyle, PCP follow-up           DVT prophylaxis:Pradaxa Code Status: Full Family Communication: None at bedside Status is: Inpatient  Remains inpatient appropriate because:Unsafe d/c plan  Dispo: The patient is from: Home              Anticipated d/c is to: CIR              Patient currently is medically stable for dc   Difficult to place patient No     Consultants: neurology  Procedures:EEG  Antimicrobials:  Anti-infectives (From admission, onward)    None       Subjective:  Patient seen and examined at the bedside this morning.  Noted the events that happened last night.  Today he looks drowsy/sleepy, hardly able to open his eyes.  Complains of some headache.  Obeys commands.  Has left facial droop, dense left hemiparesis.   Objective: Vitals:   10/18/20 1622 10/18/20 2107 10/18/20 2339 10/19/20 0328  BP: (!) 166/62 (!) 147/71 (!) 143/76 131/70  Pulse: (!) 47 (!) 58 Marland Kitchen)  54 (!) 55  Resp: '14 20 20 18  '$ Temp: 98.9 F (37.2 C) 98.2 F (36.8 C) 99 F (37.2 C) 98.5 F (36.9 C)  TempSrc: Oral Oral Oral Oral  SpO2: 100% 100% 100% 100%  Weight:      Height:        Intake/Output Summary (Last 24 hours) at 10/19/2020 0757 Last data filed  at 10/18/2020 1200 Gross per 24 hour  Intake 620 ml  Output 800 ml  Net -180 ml   Filed Weights   10/14/20 0840  Weight: 95.7 kg    Examination:  General exam: Very deconditioned, weak, drowsy Respiratory system:  no wheezes or crackles  Cardiovascular system: S1 & S2 heard, RRR.  Gastrointestinal system: Abdomen is nondistended, soft and nontender. Central nervous system: Awake, left facial droop, left severe hemiparesis with motor of 1/5 on both upper and lower extremities Extremities: No edema, no clubbing ,no cyanosis Skin: No rashes, no ulcers,no icterus        Data Reviewed: I have personally reviewed following labs and imaging studies  CBC: Recent Labs  Lab 10/14/20 0913 10/14/20 0920 10/15/20 0500 10/16/20 0346  WBC  --  4.3 4.3 4.4  NEUTROABS  --  2.6 1.8  --   HGB 14.3 13.5 11.8* 11.8*  HCT 42.0 42.1 37.9* 36.7*  MCV  --  81.6 83.8 81.9  PLT  --  227 193 [id number]   Basic Metabolic Panel: Recent Labs  Lab 10/14/20 0913 10/14/20 0920 10/15/20 0500 10/16/20 0346  NA 140 137 135 134*  K 3.6 3.7 3.6 4.1  CL 104 104 102 104  CO2  --  '22 23 24  '$ GLUCOSE 118* 117* 86 104*  BUN '19 17 18 15  '$ CREATININE 1.20 1.30* 1.22 1.25*  CALCIUM  --  9.2 8.9 8.8*  MG  --   --  1.8  --    GFR: Estimated Creatinine Clearance: 57.7 mL/min (A) (by C-G formula based on SCr of 1.25 mg/dL (H)). Liver Function Tests: Recent Labs  Lab 10/14/20 0920 10/15/20 0500  AST 22 19  ALT 13 12  ALKPHOS 77 65  BILITOT 1.0 0.8  PROT 7.1 6.2*  ALBUMIN 3.6 3.0*   No results for input(s): LIPASE, AMYLASE in the last 168 hours. No results for input(s): AMMONIA in the last 168 hours. Coagulation Profile: Recent Labs  Lab 10/14/20 0920  INR 1.4*   Cardiac Enzymes: No results for input(s): CKTOTAL, CKMB, CKMBINDEX, TROPONINI in the last 168 hours. BNP (last 3 results) No results for input(s): PROBNP in the last 8760 hours. HbA1C: No results for input(s): HGBA1C in the last 72  hours. CBG: No results for input(s): GLUCAP in the last 168 hours. Lipid Profile: No results for input(s): CHOL, HDL, LDLCALC, TRIG, CHOLHDL, LDLDIRECT in the last 72 hours. Thyroid Function Tests: No results for input(s): TSH, T4TOTAL, FREET4, T3FREE, THYROIDAB in the last 72 hours. Anemia Panel: No results for input(s): VITAMINB12, FOLATE, FERRITIN, TIBC, IRON, RETICCTPCT in the last 72 hours. Sepsis Labs: No results for input(s): PROCALCITON, LATICACIDVEN in the last 168 hours.  Recent Results (from the past 240 hour(s))  Resp Panel by RT-PCR (Flu A&B, Covid) Nasopharyngeal Swab     Status: None   Collection Time: 10/14/20  7:06 PM   Specimen: Nasopharyngeal Swab; Nasopharyngeal(NP) swabs in vial transport medium  Result Value Ref Range Status   SARS Coronavirus 2 by RT PCR NEGATIVE NEGATIVE Final    Comment: (NOTE) SARS-CoV-2 target nucleic acids are  NOT DETECTED.  The SARS-CoV-2 RNA is generally detectable in upper respiratory specimens during the acute phase of infection. The lowest concentration of SARS-CoV-2 viral copies this assay can detect is 138 copies/mL. A negative result does not preclude SARS-Cov-2 infection and should not be used as the sole basis for treatment or other patient management decisions. A negative result may occur with  improper specimen collection/handling, submission of specimen other than nasopharyngeal swab, presence of viral mutation(s) within the areas targeted by this assay, and inadequate number of viral copies(<138 copies/mL). A negative result must be combined with clinical observations, patient history, and epidemiological information. The expected result is Negative.  Fact Sheet for Patients:  EntrepreneurPulse.com.au  Fact Sheet for Healthcare Providers:  IncredibleEmployment.be  This test is no t yet approved or cleared by the [place] FDA and  has been authorized for detection and/or  diagnosis of SARS-CoV-2 by FDA under an Emergency Use Authorization (EUA). This EUA will remain  in effect (meaning this test can be used) for the duration of the COVID-19 declaration under Section 564(b)(1) of the Act, 21 U.S.C.section 360bbb-3(b)(1), unless the authorization is terminated  or revoked sooner.       Influenza A by PCR NEGATIVE NEGATIVE Final   Influenza B by PCR NEGATIVE NEGATIVE Final    Comment: (NOTE) The Xpert Xpress SARS-CoV-2/FLU/RSV plus assay is intended as an aid in the diagnosis of influenza from Nasopharyngeal swab specimens and should not be used as a sole basis for treatment. Nasal washings and aspirates are unacceptable for Xpert Xpress SARS-CoV-2/FLU/RSV testing.  Fact Sheet for Patients: EntrepreneurPulse.com.au  Fact Sheet for Healthcare Providers: IncredibleEmployment.be  This test is not yet approved or cleared by the [place] FDA and has been authorized for detection and/or diagnosis of SARS-CoV-2 by FDA under an Emergency Use Authorization (EUA). This EUA will remain in effect (meaning this test can be used) for the duration of the COVID-19 declaration under Section 564(b)(1) of the Act, 21 U.S.C. section 360bbb-3(b)(1), unless the authorization is terminated or revoked.  Performed at [facility], [place] [address], [place], [place] [address]          Radiology Studies: CT HEAD WO CONTRAST (5MM)  Result Date: 10/19/2020 CLINICAL DATA:  Stroke follow-up EXAM: CT HEAD WITHOUT CONTRAST TECHNIQUE: Contiguous axial images were obtained from the base of the skull through the vertex without intravenous contrast. COMPARISON:  None. FINDINGS: Brain: Evolving right posterior MCA territory infarct with increased area of cytotoxic edema now involving a greater portion of the temporal lobe. There is moderate mass effect on the right lateral ventricle. No hydrocephalus or entrapment. There is now leftward  midline shift approximately 3 mm but no herniation. No acute hemorrhage. Vascular: Atherosclerotic calcification of the internal carotid arteries at the skull base. No abnormal hyperdensity of the major intracranial arteries or dural venous sinuses. Skull: The visualized skull base, calvarium and extracranial soft tissues are normal. Sinuses/Orbits: No fluid levels or advanced mucosal thickening of the visualized paranasal sinuses. No mastoid or middle ear effusion. The orbits are normal. IMPRESSION: 1. Evolving right posterior MCA territory infarct with increased area of cytotoxic edema now involving a greater portion of the temporal lobe. 2. No acute hemorrhage. 3. Moderate mass effect on the right lateral ventricle with 3 mm leftward midline shift but no herniation. Electronically Signed   By: Ulyses Jarred M.D.   On: 10/19/2020 03:11        Scheduled Meds:  atorvastatin  40 mg Oral  Daily   dabigatran  150 mg Oral BID   finasteride  5 mg Oral Q0600   levETIRAcetam  1,000 mg Oral BID   losartan  50 mg Oral Daily   psyllium  1 packet Oral Daily   Continuous Infusions:   LOS: 5 days    Time spent: 25 mins.More than 50% of that time was spent in counseling and/or coordination of care.      Shelly Coss, MD [facility] P8/16/2022, 7:57 AM

## 2020-10-20 DIAGNOSIS — R531 Weakness: Secondary | ICD-10-CM | POA: Diagnosis not present

## 2020-10-20 LAB — BASIC METABOLIC PANEL
Anion gap: 8 (ref 5–15)
BUN: 18 mg/dL (ref 8–23)
CO2: 24 mmol/L (ref 22–32)
Calcium: 8.8 mg/dL — ABNORMAL LOW (ref 8.9–10.3)
Chloride: 103 mmol/L (ref 98–111)
Creatinine, Ser: 1.4 mg/dL — ABNORMAL HIGH (ref 0.61–1.24)
GFR, Estimated: 54 mL/min — ABNORMAL LOW (ref >60)
Glucose, Bld: 118 mg/dL — ABNORMAL HIGH (ref 70–99)
Potassium: 3.8 mmol/L (ref 3.5–5.1)
Sodium: 135 mmol/L (ref 135–145)

## 2020-10-20 MED ORDER — HYDROCHLOROTHIAZIDE 25 MG PO TABS
25.0000 mg | ORAL_TABLET | Freq: Every day | ORAL | Status: DC
  Administered 2020-10-20: 25 mg via ORAL
  Filled 2020-10-20 (×2): qty 1

## 2020-10-20 MED ORDER — SODIUM CHLORIDE 0.9 % IV SOLN: INTRAVENOUS | Status: DC

## 2020-10-20 NOTE — Discharge Instructions (Addendum)
Information on my medicine - ELIQUIS (apixaban)  Why was Eliquis prescribed for you? Eliquis was prescribed to reduce the risk of blood clots occurring again.  What do You need to know about Eliquis ? Take your Eliquis TWICE DAILY - one tablet in the morning and one tablet in the evening with or without food.  It would be best to take the doses about the same time each day.  If you have difficulty swallowing the tablet whole please discuss with your pharmacist how to take the medication safely.  Take Eliquis exactly as prescribed by your doctor and DO NOT stop taking Eliquis without talking to the doctor who prescribed the medication.  Stopping may increase your risk of developing a new clot or stroke.  Refill your prescription before you run out.  After discharge, you should have regular check-up appointments with your healthcare provider that is prescribing your Eliquis.  In the future your dose may need to be changed if your kidney function or weight changes by a significant amount or as you get older.  What do you do if you miss a dose? If you miss a dose, take it as soon as you remember on the same day and resume taking twice daily.  Do not take more than one dose of ELIQUIS at the same time.  Important Safety Information A possible side effect of Eliquis is bleeding. You should call your healthcare provider right away if you experience any of the following: Bleeding from an injury or your nose that does not stop. Unusual colored urine (red or dark brown) or unusual colored stools (red or black). Unusual bruising for unknown reasons. A serious fall or if you hit your head (even if there is no bleeding).  Some medicines may interact with Eliquis and might increase your risk of bleeding or clotting while on Eliquis. To help avoid this, consult your healthcare provider or pharmacist prior to using any new prescription or non-prescription medications, including herbals, vitamins,  non-steroidal anti-inflammatory drugs (NSAIDs) and supplements.  This website has more information on Eliquis (apixaban): [email or web address].

## 2020-10-20 NOTE — Progress Notes (Signed)
  Speech Language Pathology Treatment: Dysphagia;Cognitive-Linquistic  Patient Details Name: Clifford [name] MRN: [id number] DOB: 1950/12/05 Today's Date: 10/20/2020 Time: [id number] SLP Time Calculation (min) (ACUTE ONLY): 28 min  Assessment / Plan / Recommendation Clinical Impression  Pt seen for skilled treatment to re-assess swallow function/diet tolerance and assess current speech/language/cognitive function.  Pt oriented x4, but exhibited difficulty with sustained attention tasks without mod verbal/tactile cues to attend/swallow during limited PO intake.  Significant oral holding and decreased oral manipulation noted throughout session with L anterior loss noted and L buccal retention.  Pt with decreased awareness stating he "didn't need any help when he went home" and "can walk with a cane" currently despite L flaccid hemiparesis.  Impaired cognition impacts swallowing and language overall.  Speech is moderately dysarthric during single words-simple phrases with intelligibility judged to be 50-75% accurate.  MBS may be indicated d/t limited hydration/nutrition and change in pt status from a standpoint of mentation, as well as overt s/s of aspiration including wet vocal quality,  L oral impaired sensation and oral/pharyngeal retention.  Continue current diet of Dysphagia 1/nectar-thickened liquids with MBS needed for potential change in PO intake.  HPI HPI: 70yo male admitted 10/14/20 with right side headache and LUE weakness. 10/19/20 pt was noted to have increased LUE weakness. 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 CTHead = evolving RMCA infarct with cytotoxic edema, mass effect      SLP Plan  MBS       Recommendations  Diet recommendations: Dysphagia 1 (puree);Nectar-thick liquid Liquids provided via: Teaspoon;Straw Medication Administration: Crushed with puree Supervision: Full supervision/cueing for compensatory  strategies Compensations: Minimize environmental distractions;Slow rate;Small sips/bites;Lingual sweep for clearance of pocketing;Multiple dry swallows after each bite/sip;Clear throat intermittently                Oral Care Recommendations: Oral care QID Follow up Recommendations: 24 hour supervision/assistance;Other (comment) (TBD) SLP Visit Diagnosis: Dysphagia, oropharyngeal phase (R13.12) Plan: MBS                       Elvina Sidle, M.S., CCC-SLP 10/20/2020, 9:45 AM

## 2020-10-20 NOTE — Progress Notes (Signed)
Patient ID: Clifford [name], male   DOB: 1950-04-07, 70 y.o.   MRN: [id number] STROKE TEAM PROGRESS NOTE     SUBJECTIVE (INTERVAL HISTORY) Patient was sleepier today but seems quite awake and interactive during my exam.  His daughter is at the bedside.  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  Repeat CT scan of the head yesterday evening showed stable but persistent cytotoxic edema in the posterior division right MCA infarct with mild right to left midline shift with no herniation.  Vital signs remained stable.  Neurological exam is unchanged OBJECTIVE Temp:  [97.5 F (36.4 C)-98.8 F (37.1 C)] 98.1 F (36.7 C) (08/17 1120) Pulse Rate:  [52-66] 52 (08/17 1120) Cardiac Rhythm: Heart block (08/17 0925) Resp:  [16-19] 18 (08/17 1120) BP: (140-181)/(67-91) 144/76 (08/17 1120) SpO2:  [93 %-100 %] 99 % (08/17 1120)  No results for input(s): GLUCAP in the last 168 hours. Recent Labs  Lab 10/14/20 0913 10/14/20 0920 10/14/20 0920 10/15/20 0500 10/16/20 0346 10/20/20 0142  NA 140 137  --  135 134* 135  K 3.6 3.7  --  3.6 4.1 3.8  CL 104 104  --  102 104 103  CO2  --  22  --  '23 24 24  '$ GLUCOSE 118* 117*  --  86 104* 118*  BUN 19 17  --  '18 15 18  '$ CREATININE 1.20 1.30*  --  1.22 1.25* 1.40*  CALCIUM  --  9.2   < > 8.9 8.8* 8.8*  MG  --   --   --  1.8  --   --    < > = values in this interval not displayed.   Recent Labs  Lab 10/14/20 0920 10/15/20 0500  AST 22 19  ALT 13 12  ALKPHOS 77 65  BILITOT 1.0 0.8  PROT 7.1 6.2*  ALBUMIN 3.6 3.0*   Recent Labs  Lab 10/14/20 0913 10/14/20 0920 10/15/20 0500 10/16/20 0346  WBC  --  4.3 4.3 4.4  NEUTROABS  --  2.6 1.8  --   HGB 14.3 13.5 11.8* 11.8*  HCT 42.0 42.1 37.9* 36.7*  MCV  --  81.6 83.8 81.9  PLT  --  227 193 169   No results for input(s): CKTOTAL, CKMB, CKMBINDEX, TROPONINI in the last 168 hours. No results for input(s): LABPROT, INR in the last 72 hours.  No results for input(s): COLORURINE,  LABSPEC, Gates, GLUCOSEU, HGBUR, BILIRUBINUR, KETONESUR, PROTEINUR, UROBILINOGEN, NITRITE, LEUKOCYTESUR in the last 72 hours.  Invalid input(s): APPERANCEUR     Component Value Date/Time   CHOL 128 10/07/2020 0450   TRIG 63 10/07/2020 0450   HDL 40 (L) 10/07/2020 0450   CHOLHDL 3.2 10/07/2020 0450   VLDL 13 10/07/2020 0450   LDLCALC 75 10/07/2020 0450   Lab Results  Component Value Date   HGBA1C 5.9 (H) 10/07/2020   No results found for: LABOPIA, COCAINSCRNUR, LABBENZ, AMPHETMU, THCU, LABBARB  No results for input(s): ETH in the last 168 hours.  I have personally reviewed the radiological images below and agree with the radiology interpretations.  CT Angio Head W or Wo Contrast  Result Date: 10/06/2020 CLINICAL DATA:  Stroke. TIA. Assess intracranial arteries. Unsteady gait. Left-sided weakness. Left-sided visual loss. Headache. EXAM: CT ANGIOGRAPHY HEAD AND NECK TECHNIQUE: Multidetector CT imaging of the head and neck was performed using the standard protocol during bolus administration of intravenous contrast. Multiplanar CT image reconstructions and MIPs were obtained to evaluate the vascular  anatomy. Carotid stenosis measurements (when applicable) are obtained utilizing NASCET criteria, using the distal internal carotid diameter as the denominator. CONTRAST:  126m OMNIPAQUE IOHEXOL 350 MG/ML SOLN COMPARISON:  Head CT earlier same day FINDINGS: CTA NECK FINDINGS Aortic arch: Aortic atherosclerosis.  Branching pattern is normal. Right carotid system: Common carotid artery is tortuous but widely patent to the bifurcation. Carotid bifurcation is widely patent. Minimal calcified plaque at the distal ICA bulb but no stenosis. Cervical ICA widely patent. Left carotid system: Common carotid artery is tortuous but widely patent to the bifurcation. Carotid bifurcation is normal. Minimal calcified plaque at the ICA bulb but no stenosis. Cervical ICA widely patent beyond that. Vertebral arteries:  Right vertebral artery origin is widely patent. Left vertebral artery origin is poorly seen because of regional venous reflux. Both vertebral arteries do appear patent through the cervical region to the foramen magnum. Skeleton: Ordinary cervical spondylosis. Other neck: No mass or lymphadenopathy. Upper chest: Upper lungs are clear. Review of the MIP images confirms the above findings CTA HEAD FINDINGS Anterior circulation: Both internal carotid arteries are patent through the skull base and siphon regions. There is ordinary siphon atherosclerotic calcification. On the left, there is no stenosis. On the right, there is 50-70% stenosis in the siphon. Supraclinoid internal carotid arteries are widely patent. The anterior and middle cerebral vessels are patent. No large vessel occlusion, with particular attention to the right MCA. Posterior circulation: Both vertebral arteries are patent through the foramen magnum to the basilar. No basilar stenosis. Posterior circulation branch vessels are normal. Venous sinuses: Patent and normal. Anatomic variants: None significant. Review of the MIP images confirms the above findings IMPRESSION: No intracranial large vessel occlusion identified at this time, with specific attention to the right MCA branches. Minimal atherosclerotic change at both internal carotid artery bulbs. No stenosis. 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 No stenosis on the left. 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 Electronically Signed   By: MNelson ChimesM.D.   On: 10/06/2020 15:01   CT HEAD WO CONTRAST (5MM)  Result Date: 10/19/2020 CLINICAL DATA:  Follow-up stroke with altered mental status, initial encounter EXAM: CT HEAD WITHOUT CONTRAST TECHNIQUE: Contiguous axial images were obtained from the base of the skull through the vertex without intravenous contrast. COMPARISON:  CT from earlier in the same day. FINDINGS: Brain: There are again changes of edema and decreased  attenuation in the distribution of the right posterior middle cerebral artery consistent with prior infarct. There is again noted some effacement of the right lateral ventricle as well as mild midline shift from right to left of approximately 4 mm. No acute hemorrhage is seen. No new infarct is noted. Vascular: No hyperdense vessel or unexpected calcification. Skull: Normal. Negative for fracture or focal lesion. Sinuses/Orbits: No acute finding. Other: None. IMPRESSION: Evolving infarct involving the posterior aspect of the right middle cerebral artery. Persistent mass effect upon the right lateral ventricle is noted with mild midline shift. No new focal abnormality is seen. No herniation is seen. Electronically Signed   By: MInez CatalinaM.D.   On: 10/19/2020 19:28   CT HEAD WO CONTRAST (5MM)  Result Date: 10/19/2020 CLINICAL DATA:  Stroke follow-up EXAM: CT HEAD WITHOUT CONTRAST TECHNIQUE: Contiguous axial images were obtained from the base of the skull through the vertex without intravenous contrast. COMPARISON:  None. FINDINGS: Brain: Evolving right posterior MCA territory infarct with increased area of cytotoxic edema now involving a greater portion of the temporal lobe.  There is moderate mass effect on the right lateral ventricle. No hydrocephalus or entrapment. There is now leftward midline shift approximately 3 mm but no herniation. No acute hemorrhage. Vascular: Atherosclerotic calcification of the internal carotid arteries at the skull base. No abnormal hyperdensity of the major intracranial arteries or dural venous sinuses. Skull: The visualized skull base, calvarium and extracranial soft tissues are normal. Sinuses/Orbits: No fluid levels or advanced mucosal thickening of the visualized paranasal sinuses. No mastoid or middle ear effusion. The orbits are normal. IMPRESSION: 1. Evolving right posterior MCA territory infarct with increased area of cytotoxic edema now involving a greater portion of the  temporal lobe. 2. No acute hemorrhage. 3. Moderate mass effect on the right lateral ventricle with 3 mm leftward midline shift but no herniation. Electronically Signed   By: Ulyses Jarred M.D.   On: 10/19/2020 03:11   CT HEAD WO CONTRAST (5MM)  Result Date: 10/15/2020 CLINICAL DATA:  Neuro deficit, acute, stroke suspected. New focal defects. EXAM: CT HEAD WITHOUT CONTRAST TECHNIQUE: Contiguous axial images were obtained from the base of the skull through the vertex without intravenous contrast. COMPARISON:  Head CT and MRI yesterday. FINDINGS: Brain: Confluent acute infarction in the right posterior temporal and temporoparietal junction regions shows discrete low-density by CT, with minimal petechial blood products but no frank hematoma. Smaller areas cortical and subcortical infarction in the right frontal and frontoparietal cortex and underlying white matter are seen by CT is subtle findings. No hemorrhage in that region. No new insult is identified. Mild swelling but no significant mass effect or any midline shift. No hydrocephalus. No extra-axial collection. Vascular: There is atherosclerotic calcification of the major vessels at the base of the brain. Skull: Negative Sinuses/Orbits: Clear/normal Other: None IMPRESSION: No apparent change since yesterday. Confluent infarction at the right posterior temporal and temporoparietal junction region with petechial blood products but no frank hematoma. Smaller more subtle infarctions of the cortical and subcortical brain in the right frontal and frontoparietal region appear unchanged. Electronically Signed   By: Nelson Chimes M.D.   On: 10/15/2020 15:16   CT HEAD WO CONTRAST (5MM)  Result Date: 10/14/2020 CLINICAL DATA:  Right-sided headache, recent stroke EXAM: CT HEAD WITHOUT CONTRAST TECHNIQUE: Contiguous axial images were obtained from the base of the skull through the vertex without intravenous contrast. COMPARISON:  10/06/2020 CT head, 10/07/2020 MRI head  FINDINGS: Brain: Area of hypodensity in the right parietal and occipital lobes, which correlates with the area of infarct seen on the 10/06/2020 CT and 10/07/2020 MRI, with edema in this area. Subtle areas of hyperdensity, likely petechial hemorrhage. No evidence of hemorrhagic transformation. No new areas of infarction. No significant midline shift. No mass or extra-axial collection. Vascular: No hyperdense vessel or unexpected calcification. Skull: Normal. Negative for fracture or focal lesion. Sinuses/Orbits: Mucosal thickening in the left frontal sinus and bilateral ethmoid air cells. The orbits are unremarkable. Other: None. IMPRESSION: Expected evolution of the previously noted right MCA/PCA territory infarct, with petechial hemorrhage and mild edema, without midline shift or hemorrhagic transformation. Electronically Signed   By: Merilyn Baba MD   On: 10/14/2020 10:02   CT HEAD WO CONTRAST (5MM)  Result Date: 10/06/2020 CLINICAL DATA:  Neuro deficit, acute, stroke suspected. Steady gait, left-sided weakness and left-sided vision loss, increased blood pressure, headache for 3 days, history of hypertension. EXAM: CT HEAD WITHOUT CONTRAST TECHNIQUE: Contiguous axial images were obtained from the base of the skull through the vertex without intravenous contrast. COMPARISON:  No pertinent  prior exams available for comparison. FINDINGS: Brain: Cerebral volume is normal for age. Region of abnormal cortical/subcortical hypodensity measuring 6.3 x 3.3 x 5.8 cm within the right parietooccipital lobes compatible with acute/early subacute infarction (right PCA vascular territory and right MCA/PCA watershed territory). No significant mass effect at this time. No evidence of hemorrhagic conversion. Chronic appearing lacunar infarct within the left lentiform nucleus. No extra-axial fluid collection. No evidence of an intracranial mass. No midline shift. Vascular: No hyperdense vessel.  Atherosclerotic calcifications.  Skull: Normal. Negative for fracture or focal lesion. Sinuses/Orbits: Visualized orbits show no acute finding. Mild mucosal thickening within the frontal sinuses bilaterally. Mild-to-moderate mucosal thickening within the bilateral ethmoid air cells. These results were discussed by telephone at the time of interpretation on 10/06/2020 at 1:55 pm with provider MATTHEW TRIFAN , who verbally acknowledged these results. IMPRESSION: 6.3 x 3.3 x 5.8 cm acute/early subacute cortical and subcortical infarct within the right parietooccipital lobes (right PCA vascular territory and right MCA/PCA watershed territory). Consider MR or CT angiography for further evaluation. Chronic left basal ganglia lacunar infarct. Paranasal sinus disease at the imaged levels, as described. Electronically Signed   By: Kellie Simmering DO   On: 10/06/2020 14:13   CT Angio Neck W and/or Wo Contrast  Result Date: 10/06/2020 CLINICAL DATA:  Stroke. TIA. Assess intracranial arteries. Unsteady gait. Left-sided weakness. Left-sided visual loss. Headache. EXAM: CT ANGIOGRAPHY HEAD AND NECK TECHNIQUE: Multidetector CT imaging of the head and neck was performed using the standard protocol during bolus administration of intravenous contrast. Multiplanar CT image reconstructions and MIPs were obtained to evaluate the vascular anatomy. Carotid stenosis measurements (when applicable) are obtained utilizing NASCET criteria, using the distal internal carotid diameter as the denominator. CONTRAST:  178m OMNIPAQUE IOHEXOL 350 MG/ML SOLN COMPARISON:  Head CT earlier same day FINDINGS: CTA NECK FINDINGS Aortic arch: Aortic atherosclerosis.  Branching pattern is normal. Right carotid system: Common carotid artery is tortuous but widely patent to the bifurcation. Carotid bifurcation is widely patent. Minimal calcified plaque at the distal ICA bulb but no stenosis. Cervical ICA widely patent. Left carotid system: Common carotid artery is tortuous but widely patent to  the bifurcation. Carotid bifurcation is normal. Minimal calcified plaque at the ICA bulb but no stenosis. Cervical ICA widely patent beyond that. Vertebral arteries: Right vertebral artery origin is widely patent. Left vertebral artery origin is poorly seen because of regional venous reflux. Both vertebral arteries do appear patent through the cervical region to the foramen magnum. Skeleton: Ordinary cervical spondylosis. Other neck: No mass or lymphadenopathy. Upper chest: Upper lungs are clear. Review of the MIP images confirms the above findings CTA HEAD FINDINGS Anterior circulation: Both internal carotid arteries are patent through the skull base and siphon regions. There is ordinary siphon atherosclerotic calcification. On the left, there is no stenosis. On the right, there is 50-70% stenosis in the siphon. Supraclinoid internal carotid arteries are widely patent. The anterior and middle cerebral vessels are patent. No large vessel occlusion, with particular attention to the right MCA. Posterior circulation: Both vertebral arteries are patent through the foramen magnum to the basilar. No basilar stenosis. Posterior circulation branch vessels are normal. Venous sinuses: Patent and normal. Anatomic variants: None significant. Review of the MIP images confirms the above findings IMPRESSION: No intracranial large vessel occlusion identified at this time, with specific attention to the right MCA branches. Minimal atherosclerotic change at both internal carotid artery bulbs. No stenosis. 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 No stenosis on  the left. 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 Electronically Signed   By: Nelson Chimes M.D.   On: 10/06/2020 15:01   MR BRAIN WO CONTRAST  Result Date: 10/14/2020 CLINICAL DATA:  Stroke, follow up.  Eval for progression of stroke. EXAM: MRI HEAD WITHOUT CONTRAST TECHNIQUE: Multiplanar, multiecho pulse sequences of the brain and surrounding  structures were obtained without intravenous contrast. COMPARISON:  Head CT 10/14/2020 and MRI 10/07/2020 FINDINGS: The study is intermittently motion degraded including severe motion on the coronal T2 sequence. Brain: The moderately large infarct involving the posterior right MCA territory and PCA border zone has mildly expanded compared to the prior MRI, most notable anteriorly and superiorly. Associated petechial hemorrhage is partially confluent and has increased from the prior MRI. There is progressive cytotoxic edema with regional sulcal effacement and mass effect on the right lateral ventricle. New mildly restricted diffusion in the splenium of the corpus callosum may reflect acute wallerian degeneration. Small cortical and subcortical infarcts in the right frontal lobe (border zone distribution) have also mildly increased from the prior MRI. Small T2 hyperintensities elsewhere in the cerebral white matter bilaterally are similar to the prior MRI and are nonspecific but compatible with mild chronic small vessel ischemic disease. There is no midline shift or extra-axial fluid collection. A chronic hemorrhagic infarct is again noted in the posterior aspect of the left lentiform nucleus/external capsule. Vascular: Major intracranial vascular flow voids are preserved. Skull and upper cervical spine: Unremarkable bone marrow signal para Sinuses/Orbits: Unremarkable orbits. Mild mucosal thickening in the paranasal sinuses. Clear mastoid air cells. Other: None. IMPRESSION: 1. Mild enlargement of the right MCA/border zone infarct since the 10/07/2020 MRI. Increased cytotoxic edema and petechial hemorrhage without midline shift. 2. Chronic small-vessel ischemia including an old hemorrhagic infarct in the left basal ganglia. Electronically Signed   By: Logan Bores M.D.   On: 10/14/2020 18:22   MR BRAIN WO CONTRAST  Result Date: 10/07/2020 CLINICAL DATA:  Neuro deficit, acute, stroke suspected. Additional history  provided: Vision change and mild left-sided weakness which began 2 days ago. EXAM: MRI HEAD WITHOUT CONTRAST TECHNIQUE: Multiplanar, multiecho pulse sequences of the brain and surrounding structures were obtained without intravenous contrast. COMPARISON:  Noncontrast head CT 10/06/2020. CT angiogram head/neck 10/06/2020. FINDINGS: Brain: Cerebral volume is normal. Region of abnormal cortical/subcortical restricted diffusion within the right parietal and occipital lobes measuring 6.2 x 3.3 x 6.4 cm compatible with acute/early subacute infarction (at the junction of the right MCA and PCA vascular territories). This has not significantly changed in extent as compared to the head CT performed yesterday 10/06/2020. Mild T1 hyperintensity and SWI signal loss within the infarction territory compatible with petechial hemorrhage and possibly cortical laminar necrosis. No significant mass effect at this time. There are numerous additional small patchy cortical and subcortical acute/early subacute infarcts within the right frontal and parietal lobes (watershed distribution). Background mild multifocal T2/FLAIR hyperintensity within the cerebral white matter, nonspecific but compatible with chronic small vessel ischemic disease. Chronic lacunar infarct with associated chronic hemosiderin deposition within the left basal ganglia/posterior limb of left internal capsule. No evidence of an intracranial mass. No extra-axial fluid collection. No midline shift. Vascular: Maintained proximal arterial flow voids. Skull and upper cervical spine: No focal suspicious marrow lesion. Incompletely assessed cervical spondylosis. Sinuses/Orbits: Visualized orbits show no acute finding. 11 mm left maxillary sinus mucous retention cyst. Moderate bilateral ethmoid sinus mucosal thickening. Trace mucosal thickening within the left frontal sinus. IMPRESSION: 6.2 x 6.4 cm acute/early  subacute cortical and subcortical infarction within the right  parietal and occipital lobes (at the junction of the right MCA and PCA territories). The infarct has not significantly changed in extent as compared to the head CT of 10/06/2020. Mild petechial hemorrhage within the infarction territory. No significant mass effect at this time. Numerous additional small patchy cortical and subcortical acute/early subacute infarcts within the right frontal and parietal lobes (watershed distribution). 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 Background mild chronic small vessel ischemic changes within the cerebral white matter. Paranasal sinus disease at the imaged levels, as described. Electronically Signed   By: Kellie Simmering DO   On: 10/07/2020 07:57   EEG adult  Result Date: 10/15/2020 Lora Havens, MD     10/15/2020 10:31 AM Patient Name: Clifford [name] MRN: [id number] Epilepsy Attending: Lora Havens Referring Physician/Provider: Dr Lesleigh Noe Date: 10/15/2020 Duration: 23.02 mins Patient history: 70 year old male with posterior right parietal watershed infarct who presented with worsening stroke symptoms as well as left thumb rhythmic and nonsuppressible shaking movements.  EEG to evaluate for seizures. Level of alertness: Awake, asleep AEDs during EEG study: LEV Technical aspects: This EEG study was done with scalp electrodes positioned according to the 10-20 International system of electrode placement. Electrical activity was acquired at a sampling rate of '500Hz'$  and reviewed with a high frequency filter of '70Hz'$  and a low frequency filter of '1Hz'$ . EEG data were recorded continuously and digitally stored. Description: The posterior dominant rhythm consists of 8-9 Hz activity of moderate voltage (25-35 uV) seen predominantly in posterior head regions, asymmetric ( R<L) and reactive to eye opening and eye closing. Sleep was characterized by vertex waves, sleep spindles (12 to 14 Hz), maximal frontocentral  region.  EEG showed continuous rhythmic 3 to 5 Hz theta-delta slowing in right hemisphere. Hyperventilation and photic stimulation were not performed.   ABNORMALITY - Continuous slow, right hemisphere - Background asymmetry, right<left IMPRESSION: This study is suggestive of cortical dysfunction in right hemisphere, likely secondary to underlying stroke.  No seizures or epileptiform discharges were seen throughout the recording. Priyanka Barbra Sarks   Overnight EEG with video  Result Date: 10/16/2020 Lora Havens, MD     10/16/2020  3:23 PM Patient Name: AMARION REEVES MRN: [id number] Epilepsy Attending: Lora Havens Referring Physician/Provider: Dr Rosalin Hawking Duration: 10/15/2020 1639 to 10/16/2020 0230  Patient history: 70 year old male with posterior right parietal watershed infarct who presented with worsening stroke symptoms as well as left thumb rhythmic and nonsuppressible shaking movements.  EEG to evaluate for seizures.  Level of alertness: Awake, asleep  AEDs during EEG study: LEV  Technical aspects: This EEG study was done with scalp electrodes positioned according to the 10-20 International system of electrode placement. Electrical activity was acquired at a sampling rate of '500Hz'$  and reviewed with a high frequency filter of '70Hz'$  and a low frequency filter of '1Hz'$ . EEG data were recorded continuously and digitally stored.  Description: The posterior dominant rhythm consists of 8-9 Hz activity of moderate voltage (25-35 uV) seen predominantly in posterior head regions, asymmetric ( R<L) and reactive to eye opening and eye closing. Sleep was characterized by vertex waves, sleep spindles (12 to 14 Hz), maximal frontocentral region.  EEG showed continuous rhythmic 3 to 5 Hz theta-delta slowing in right hemisphere. Hyperventilation and photic stimulation were not performed. EEG was not interpretable after 0230 on 10/16/2020 due to significant electrode artifact.   ABNORMALITY -  Continuous slow, right  hemisphere - Background asymmetry, right<left  IMPRESSION: This study is suggestive of cortical dysfunction in right hemisphere, likely secondary to underlying stroke.  No seizures or epileptiform discharges were seen throughout the recording.  Lora Havens   ECHOCARDIOGRAM COMPLETE  Result Date: 10/07/2020    ECHOCARDIOGRAM REPORT   Patient Name:   NIHAN HOLZKNECHT Date of Exam: 10/07/2020 Medical Rec #:  [id number]    Height:       65.0 in Accession #:    [id number]   Weight: Date of Birth:  01-May-1950     BSA: Patient Age:    40 years     BP:           115/58 mmHg Patient Gender: M            HR:           66 bpm. Exam Location:  Inpatient Procedure: 2D Echo, Cardiac Doppler and Color Doppler Indications:    Stroke I63.9  History:        Patient has no prior history of Echocardiogram examinations.                 Risk Factors:Hypertension and Dyslipidemia. PVD. PE.  Sonographer:    Vickie Epley RDCS Referring Phys: [facility]  1. Left ventricular ejection fraction, by estimation, is 65 to 70%. The left ventricle has hyperdynamic function. The left ventricle has no regional wall motion abnormalities. Left ventricular diastolic parameters are consistent with Grade I diastolic dysfunction (impaired relaxation).  2. Right ventricular systolic function is normal. The right ventricular size is normal. Tricuspid regurgitation signal is inadequate for assessing PA pressure.  3. The mitral valve is normal in structure. No evidence of mitral valve regurgitation. No evidence of mitral stenosis.  4. The aortic valve is tricuspid. Aortic valve regurgitation is not visualized. No aortic stenosis is present.  5. The inferior vena cava is normal in size with greater than 50% respiratory variability, suggesting right atrial pressure of 3 mmHg. FINDINGS  Left Ventricle: Left ventricular ejection fraction, by estimation, is 65 to 70%. The left ventricle has hyperdynamic function. The left ventricle has no  regional wall motion abnormalities. The left ventricular internal cavity size was normal in size. There is no left ventricular hypertrophy. Left ventricular diastolic parameters are consistent with Grade I diastolic dysfunction (impaired relaxation). Right Ventricle: The right ventricular size is normal. No increase in right ventricular wall thickness. Right ventricular systolic function is normal. Tricuspid regurgitation signal is inadequate for assessing PA pressure. Left Atrium: Left atrial size was normal in size. Right Atrium: Right atrial size was normal in size. Pericardium: There is no evidence of pericardial effusion. Mitral Valve: The mitral valve is normal in structure. No evidence of mitral valve regurgitation. No evidence of mitral valve stenosis. Tricuspid Valve: The tricuspid valve is normal in structure. Tricuspid valve regurgitation is not demonstrated. Aortic Valve: The aortic valve is tricuspid. Aortic valve regurgitation is not visualized. No aortic stenosis is present. Pulmonic Valve: The pulmonic valve was normal in structure. Pulmonic valve regurgitation is not visualized. Aorta: The aortic root is normal in size and structure. Venous: The inferior vena cava is normal in size with greater than 50% respiratory variability, suggesting right atrial pressure of 3 mmHg. IAS/Shunts: No atrial level shunt detected by color flow Doppler.  LEFT VENTRICLE PLAX 2D LVIDd:         3.60 cm      Diastology LVIDs:  2.50 cm      LV e' medial:    6.00 cm/s LV PW:         1.00 cm      LV E/e' medial:  7.4 LV IVS:        1.10 cm      LV e' lateral:   7.62 cm/s LVOT diam:     2.20 cm      LV E/e' lateral: 5.8 LV SV:         97 LVOT Area:     3.80 cm  LV Volumes (MOD) LV vol d, MOD A2C: 98.1 ml LV vol d, MOD A4C: 108.0 ml LV vol s, MOD A2C: 39.6 ml LV vol s, MOD A4C: 40.2 ml LV SV MOD A2C:     58.5 ml LV SV MOD A4C:     108.0 ml LV SV MOD BP:      64.0 ml RIGHT VENTRICLE RV S prime:     14.80 cm/s TAPSE  (M-mode): 2.3 cm LEFT ATRIUM           RIGHT ATRIUM LA diam:      3.20 cm RA Area:     14.70 cm LA Vol (A2C): 31.0 ml RA Volume:   36.60 ml LA Vol (A4C): 24.7 ml  AORTIC VALVE LVOT Vmax:   129.00 cm/s LVOT Vmean:  96.600 cm/s LVOT VTI:    0.254 m  AORTA Ao Root diam: 3.50 cm Ao Asc diam:  3.50 cm MITRAL VALVE MV Area (PHT): 2.41 cm    SHUNTS MV Decel Time: 315 msec    Systemic VTI:  0.25 m MV E velocity: 44.50 cm/s  Systemic Diam: 2.20 cm MV A velocity: 65.60 cm/s MV E/A ratio:  0.68 Loralie Champagne MD Electronically signed by Loralie Champagne MD Signature Date/Time: 10/07/2020/5:59:38 PM    Final     TTE  IMPRESSIONS:  1. Left ventricular ejection fraction, by estimation, is 65 to 70%. The  left ventricle has hyperdynamic function. The left ventricle has no  regional wall motion abnormalities. Left ventricular diastolic parameters  are consistent with Grade I diastolic  dysfunction (impaired relaxation).   2. Right ventricular systolic function is normal. The right ventricular  size is normal. Tricuspid regurgitation signal is inadequate for assessing  PA pressure.   3. The mitral valve is normal in structure. No evidence of mitral valve  regurgitation. No evidence of mitral stenosis.   4. The aortic valve is tricuspid. Aortic valve regurgitation is not  visualized. No aortic stenosis is present.   5. The inferior vena cava is normal in size with greater than 50%  respiratory variability, suggesting right atrial pressure of 3 mmHg.   EEG: IMPRESSION: This study is suggestive of cortical dysfunction in right hemisphere, likely secondary to underlying stroke.  No seizures or epileptiform discharges were seen throughout the recording.    PHYSICAL EXAM  Temp:  [97.5 F (36.4 C)-98.8 F (37.1 C)] 98.1 F (36.7 C) (08/17 1120) Pulse Rate:  [52-66] 52 (08/17 1120) Resp:  [16-19] 18 (08/17 1120) BP: (140-181)/(67-91) 144/76 (08/17 1120) SpO2:  [93 %-100 %] 99 % (08/17 1120)  General -obese  elderly African-American male in no apparent distress.  Ophthalmologic - fundi not visualized due to noncooperation.  Cardiovascular - Regular rhythm and rate.  Mental Status -  Patient is awake and interactive and follows commands well.  He is oriented to time place and person.  Speech and language is intact.  Cranial Nerves II -  XII - II - Visual field intact OU. III, IV, VI - Extraocular movements intact. V - Facial sensation intact bilaterally. VII - Facial movement intact bilaterally. VIII - Hearing & vestibular intact bilaterally. X - Palate elevates symmetrically. XII - Tongue protrusion intact.  Motor Strength - The patient's strength was normal 5/5 right hemibody.  Left hemiparesis.  With 0/5 strength.  Reflexes - The patient's reflexes were symmetrical in all extremities and he had no pathological reflexes.  Sensory - Light touch intact b/l.  Mild inattention on the left.  Coordination - no twitching noted today. Normal tone.   Gait and Station - deferred.   ASSESSMENT/PLAN Mr. TRUEMAN HOLDMAN is a 70 y.o. 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 no tPA given due to risk of bleeding  Increased petechial hemorrhage/evolution of previous right MCA/PCA territory infarct with mild edema with mild 3 mm midline shift on repeat CT 10/19/2020  MRI mild enlargement with cytotoxic edema and petechial hemorrhage without midline shift compared to 8/4 2D Echo grade 1 diastolic dysfunction, EF 65 to 70% LDL 75 HgbA1c 5.9% SCDs for VTE prophylaxis Pradaxa (dabigatran) twice a day prior to admission, now on No  antithrombotic.  Therapy recommendations:  pending Disposition:  pending  LUE non suppressible twitching/weakness, concern for focal seizure activity LTM right hemispheric slowing but no definite epileptiform activity  continue keppra. Since he is stable, no need for additional AED. Continue to monitor.  Hypertension Stable SBP goal 130-150 Long term BP goal normotensive  Hyperlipidemia Home meds:  lipitor 80 mg  LDL 75, goal < 70 on lipitor 40 mg Continue statin at discharge  Other Stroke Risk Factors Advanced age Cigarette smoker, advised to stop smoking Obesity, Body mass index is 35.11 kg/m.  Hx stroke/TIA  Hospital day # 6 Patient had slightly neurological worsening 2 days ago and repeat CT scan shows slightly increased cytotoxic edema.  But it is stable on repeat scan last night.  Recommend continue close observation for now no need for hypertonic saline further decline in his exam and worsening edema.   Continue Pradaxa for secondary stroke prevention when patient is able to swallow.  Speech therapy to do modified barium swallow.  T.  Continue Keppra for seizures since he is stable no need for additional seizure medications.  Discussed with Dr. Tamsen Meek.  And patient's daughter at the bedside.     Greater than 50% time during this 25-minute visit was spent on counseling and coordination of care and discussion with care team.  Antony Contras MD   To contact Stroke Continuity provider, please refer to [email or web address] After hours, contact General Neurology

## 2020-10-20 NOTE — Progress Notes (Signed)
RN spoke with SLP at around 1000 regarding patient's swallowing and dietary intake. Patient has had difficulty eating and decreased appetite, and swallowing seems different from yesterday's assessment. discussed possible need for Cortrak placement. Also discussed with attending MD and Stroke MD and Stroke suggested modified barium if patient is alert enough. Patient is more alert when family present.

## 2020-10-20 NOTE — Progress Notes (Signed)
Daughters at bedside requesting help with advance directives orders placed, they will be back on Friday 8/19 if cannot be accomplished today.

## 2020-10-20 NOTE — Plan of Care (Signed)

## 2020-10-20 NOTE — Progress Notes (Signed)
PROGRESS NOTE    Clifford [name]  [id number] DOB: 08-07-1950 DOA: 10/14/2020 PCP: Pcp, No   Chief Complain:AMS  Brief Narrative:  Patient is a 70 year old male with history of diastolic congestive heart failure, hypertension, hyperlipidemia, PE on anticoagulation, recent right MCA/PCA watershed ischemic stroke who presented with increasing left upper arm weakness, left visual defect.  MRI done on presentation showed evolving/increased size of stroke, petechial hemorrhages, cytotoxic edema, concern for new seizures.  Neurology was consulted and were following.  In the early morning of 10/19/2020, patient was noticed to have increased left upper extremity weakness.  CT head follow-up showed evolving right posterior MCA territory infarct with increased cytotoxic edema, mass-effect.  Neurology re-consulted.Eventual plan is to discharge to SnF as per PT/OT   Assessment & Plan:   Principal Problem:   Left-sided weakness Active Problems:   Stroke [facility])   Essential hypertension   Mixed hyperlipidemia   Prediabetes   History of pulmonary embolism   Chronic anticoagulation   Nicotine dependence, cigarettes, uncomplicated   Petechial hemorrhage   Acute stroke due to ischemia [facility])   Left-sided weakness: Secondary to worsening right parietal occipital MCA/PCA watershed stroke complicated by worsening petechial hemorrhage.  MRI of the brain reviewed.  Pradaxa was held now restarted.  Repeat CT head on 8/12 did not show any apparent change.  Neurology were following.  His left-sided weakness was improving.  In the early morning of 10/19/2020, patient was noticed to have increased left upper extremity weakness.  CT head follow-up showed evolving right posterior MCA territory infarct with increased cytotoxic edema, mass-effect.  Neurology re-consulted.  Now he has dense hemiplegia on the left side Eventual plan is to discharge to SNF as per PT/OT   Concern for seizure: Likely precipitated in the  setting of worsening stroke with petechial hemorrhage.  EEG showed cortical dysfunction , no epileptiform discharges.  Currently on Keppra.  Hypertension: Continue monitoring blood pressure.   On hydrochlorothiazide and losartan at home.  Resumed  AKI: Most likely secondary to poor oral intake.  Will start on gentle IV fluids.  Sinus bradycardia: Asymptomatic.  Continue to monitor.  Hyperlipidemia: On Lipitor.  Recent LDL of 75.  Prediabetes: Monitor blood sugars.  Carbohydrate consistent diet.  History of multiple pulmonary embolism: On lifelong anticoagulation.  Takes Pradaxa at home which was held due to finding of petechial hemorrhage,now restarted  Tobacco abuse: Counseled cessation.  Declined nicotine patch  Morbid obesity: BMI of 35.1.  We recommend weight loss, healthy lifestyle, PCP follow-up           DVT prophylaxis:Pradaxa Code Status: Full Family Communication: Discussed with daughter and brother at bedside on 10/19/2020  Remains inpatient appropriate because:Unsafe d/c plan  Dispo: The patient is from: Home              Anticipated d/c is to: SNF              Patient currently is medically stable for dc   Difficult to place patient No     Consultants: neurology  Procedures:EEG  Antimicrobials:  Anti-infectives (From admission, onward)    None       Subjective:  Patient seen and examined at the bedside this morning.  He was very drowsy, sleepy when I evaluated him.  Hardly opening his eyes.  He has left dense hemiplegia.  Did not communicate.  Objective: Vitals:   10/20/20 0016 10/20/20 0050 10/20/20 0405 10/20/20 0736  BP: (!) 181/67 140/70 (!) 147/77 (!) 163/91  Pulse: 66  63 (!) 59  Resp: '18  19 18  '$ Temp: (!) 97.5 F (36.4 C)  98.3 F (36.8 C) 98.4 F (36.9 C)  TempSrc: Oral  Oral Axillary  SpO2: 100%  93% 100%  Weight:      Height:        Intake/Output Summary (Last 24 hours) at 10/20/2020 0806 Last data filed at 10/20/2020  0300 Gross per 24 hour  Intake --  Output 400 ml  Net -400 ml   Filed Weights   10/14/20 0840  Weight: 95.7 kg    Examination:  General exam: Very deconditioned, debilitated, obese HEENT: PERRL Respiratory system:  no wheezes or crackles  Cardiovascular system: S1 & S2 heard, RRR.  Gastrointestinal system: Abdomen is nondistended, soft and nontender. Central nervous system: Not alert or oriented today.  Left  densehemiparesis Extremities: No edema, no clubbing ,no cyanosis Skin: No rashes, no ulcers,no icterus      Data Reviewed: I have personally reviewed following labs and imaging studies  CBC: Recent Labs  Lab 10/14/20 0913 10/14/20 0920 10/15/20 0500 10/16/20 0346  WBC  --  4.3 4.3 4.4  NEUTROABS  --  2.6 1.8  --   HGB 14.3 13.5 11.8* 11.8*  HCT 42.0 42.1 37.9* 36.7*  MCV  --  81.6 83.8 81.9  PLT  --  227 193 [id number]   Basic Metabolic Panel: Recent Labs  Lab 10/14/20 0913 10/14/20 0920 10/15/20 0500 10/16/20 0346 10/20/20 0142  NA 140 137 135 134* 135  K 3.6 3.7 3.6 4.1 3.8  CL 104 104 102 104 103  CO2  --  '22 23 24 24  '$ GLUCOSE 118* 117* 86 104* 118*  BUN '19 17 18 15 18  '$ CREATININE 1.20 1.30* 1.22 1.25* 1.40*  CALCIUM  --  9.2 8.9 8.8* 8.8*  MG  --   --  1.8  --   --    GFR: Estimated Creatinine Clearance: 51.5 mL/min (A) (by C-G formula based on SCr of 1.4 mg/dL (H)). Liver Function Tests: Recent Labs  Lab 10/14/20 0920 10/15/20 0500  AST 22 19  ALT 13 12  ALKPHOS 77 65  BILITOT 1.0 0.8  PROT 7.1 6.2*  ALBUMIN 3.6 3.0*   No results for input(s): LIPASE, AMYLASE in the last 168 hours. No results for input(s): AMMONIA in the last 168 hours. Coagulation Profile: Recent Labs  Lab 10/14/20 0920  INR 1.4*   Cardiac Enzymes: No results for input(s): CKTOTAL, CKMB, CKMBINDEX, TROPONINI in the last 168 hours. BNP (last 3 results) No results for input(s): PROBNP in the last 8760 hours. HbA1C: No results for input(s): HGBA1C in the last 72  hours. CBG: No results for input(s): GLUCAP in the last 168 hours. Lipid Profile: No results for input(s): CHOL, HDL, LDLCALC, TRIG, CHOLHDL, LDLDIRECT in the last 72 hours. Thyroid Function Tests: No results for input(s): TSH, T4TOTAL, FREET4, T3FREE, THYROIDAB in the last 72 hours. Anemia Panel: No results for input(s): VITAMINB12, FOLATE, FERRITIN, TIBC, IRON, RETICCTPCT in the last 72 hours. Sepsis Labs: No results for input(s): PROCALCITON, LATICACIDVEN in the last 168 hours.  Recent Results (from the past 240 hour(s))  Resp Panel by RT-PCR (Flu A&B, Covid) Nasopharyngeal Swab     Status: None   Collection Time: 10/14/20  7:06 PM   Specimen: Nasopharyngeal Swab; Nasopharyngeal(NP) swabs in vial transport medium  Result Value Ref Range Status   SARS Coronavirus 2 by RT PCR NEGATIVE NEGATIVE Final    Comment: (NOTE)  SARS-CoV-2 target nucleic acids are NOT DETECTED.  The SARS-CoV-2 RNA is generally detectable in upper respiratory specimens during the acute phase of infection. The lowest concentration of SARS-CoV-2 viral copies this assay can detect is 138 copies/mL. A negative result does not preclude SARS-Cov-2 infection and should not be used as the sole basis for treatment or other patient management decisions. A negative result may occur with  improper specimen collection/handling, submission of specimen other than nasopharyngeal swab, presence of viral mutation(s) within the areas targeted by this assay, and inadequate number of viral copies(<138 copies/mL). A negative result must be combined with clinical observations, patient history, and epidemiological information. The expected result is Negative.  Fact Sheet for Patients:  EntrepreneurPulse.com.au  Fact Sheet for Healthcare Providers:  IncredibleEmployment.be  This test is no t yet approved or cleared by the [place] FDA and  has been authorized for detection and/or  diagnosis of SARS-CoV-2 by FDA under an Emergency Use Authorization (EUA). This EUA will remain  in effect (meaning this test can be used) for the duration of the COVID-19 declaration under Section 564(b)(1) of the Act, 21 U.S.C.section 360bbb-3(b)(1), unless the authorization is terminated  or revoked sooner.       Influenza A by PCR NEGATIVE NEGATIVE Final   Influenza B by PCR NEGATIVE NEGATIVE Final    Comment: (NOTE) The Xpert Xpress SARS-CoV-2/FLU/RSV plus assay is intended as an aid in the diagnosis of influenza from Nasopharyngeal swab specimens and should not be used as a sole basis for treatment. Nasal washings and aspirates are unacceptable for Xpert Xpress SARS-CoV-2/FLU/RSV testing.  Fact Sheet for Patients: EntrepreneurPulse.com.au  Fact Sheet for Healthcare Providers: IncredibleEmployment.be  This test is not yet approved or cleared by the [place] FDA and has been authorized for detection and/or diagnosis of SARS-CoV-2 by FDA under an Emergency Use Authorization (EUA). This EUA will remain in effect (meaning this test can be used) for the duration of the COVID-19 declaration under Section 564(b)(1) of the Act, 21 U.S.C. section 360bbb-3(b)(1), unless the authorization is terminated or revoked.  Performed at [facility], [place] [address] [place], [place] [address]          Radiology Studies: CT HEAD WO CONTRAST (5MM)  Result Date: 10/19/2020 CLINICAL DATA:  Follow-up stroke with altered mental status, initial encounter EXAM: CT HEAD WITHOUT CONTRAST TECHNIQUE: Contiguous axial images were obtained from the base of the skull through the vertex without intravenous contrast. COMPARISON:  CT from earlier in the same day. FINDINGS: Brain: There are again changes of edema and decreased attenuation in the distribution of the right posterior middle cerebral artery consistent with prior infarct. There is again noted some  effacement of the right lateral ventricle as well as mild midline shift from right to left of approximately 4 mm. No acute hemorrhage is seen. No new infarct is noted. Vascular: No hyperdense vessel or unexpected calcification. Skull: Normal. Negative for fracture or focal lesion. Sinuses/Orbits: No acute finding. Other: None. IMPRESSION: Evolving infarct involving the posterior aspect of the right middle cerebral artery. Persistent mass effect upon the right lateral ventricle is noted with mild midline shift. No new focal abnormality is seen. No herniation is seen. Electronically Signed   By: Inez Catalina M.D.   On: 10/19/2020 19:28   CT HEAD WO CONTRAST (5MM)  Result Date: 10/19/2020 CLINICAL DATA:  Stroke follow-up EXAM: CT HEAD WITHOUT CONTRAST TECHNIQUE: Contiguous axial images were obtained from the base of the skull through the vertex  without intravenous contrast. COMPARISON:  None. FINDINGS: Brain: Evolving right posterior MCA territory infarct with increased area of cytotoxic edema now involving a greater portion of the temporal lobe. There is moderate mass effect on the right lateral ventricle. No hydrocephalus or entrapment. There is now leftward midline shift approximately 3 mm but no herniation. No acute hemorrhage. Vascular: Atherosclerotic calcification of the internal carotid arteries at the skull base. No abnormal hyperdensity of the major intracranial arteries or dural venous sinuses. Skull: The visualized skull base, calvarium and extracranial soft tissues are normal. Sinuses/Orbits: No fluid levels or advanced mucosal thickening of the visualized paranasal sinuses. No mastoid or middle ear effusion. The orbits are normal. IMPRESSION: 1. Evolving right posterior MCA territory infarct with increased area of cytotoxic edema now involving a greater portion of the temporal lobe. 2. No acute hemorrhage. 3. Moderate mass effect on the right lateral ventricle with 3 mm leftward midline shift but no  herniation. Electronically Signed   By: Ulyses Jarred M.D.   On: 10/19/2020 03:11        Scheduled Meds:  atorvastatin  40 mg Oral Daily   dabigatran  150 mg Oral BID   finasteride  5 mg Oral Q0600   levETIRAcetam  1,000 mg Oral BID   losartan  50 mg Oral Daily   psyllium  1 packet Oral Daily   Continuous Infusions:   LOS: 6 days    Time spent: 25 mins.More than 50% of that time was spent in counseling and/or coordination of care.      Shelly Coss, MD [facility] P8/17/2022, 8:06 AM

## 2020-10-21 ENCOUNTER — Other Ambulatory Visit: Payer: Self-pay | Admitting: *Deleted

## 2020-10-21 ENCOUNTER — Telehealth (HOSPITAL_COMMUNITY): Payer: Self-pay

## 2020-10-21 ENCOUNTER — Inpatient Hospital Stay (HOSPITAL_COMMUNITY): Payer: No Typology Code available for payment source

## 2020-10-21 DIAGNOSIS — R531 Weakness: Secondary | ICD-10-CM | POA: Diagnosis not present

## 2020-10-21 LAB — BASIC METABOLIC PANEL
Anion gap: 12 (ref 5–15)
BUN: 19 mg/dL (ref 8–23)
CO2: 23 mmol/L (ref 22–32)
Calcium: 9.2 mg/dL (ref 8.9–10.3)
Chloride: 101 mmol/L (ref 98–111)
Creatinine, Ser: 1.43 mg/dL — ABNORMAL HIGH (ref 0.61–1.24)
GFR, Estimated: 52 mL/min — ABNORMAL LOW (ref >60)
Glucose, Bld: 113 mg/dL — ABNORMAL HIGH (ref 70–99)
Potassium: 4.1 mmol/L (ref 3.5–5.1)
Sodium: 136 mmol/L (ref 135–145)

## 2020-10-21 LAB — GLUCOSE, CAPILLARY
Glucose-Capillary: 93 mg/dL (ref 70–99)
Glucose-Capillary: 109 mg/dL — ABNORMAL HIGH (ref 70–99)

## 2020-10-21 MED ORDER — AMLODIPINE BESYLATE 10 MG PO TABS
10.0000 mg | ORAL_TABLET | Freq: Every day | ORAL | Status: DC
  Administered 2020-10-22: 10 mg via ORAL
  Filled 2020-10-21 (×2): qty 1

## 2020-10-21 MED ORDER — FREE WATER
175.0000 mL | Status: DC
  Administered 2020-10-22 – 2020-10-28 (×35): 175 mL

## 2020-10-21 MED ORDER — OSMOLITE 1.5 CAL PO LIQD
1000.0000 mL | ORAL | Status: DC
  Administered 2020-10-22 – 2020-11-03 (×7): 1000 mL
  Filled 2020-10-21 (×8): qty 1000

## 2020-10-21 MED ORDER — PROSOURCE TF PO LIQD
45.0000 mL | Freq: Two times a day (BID) | ORAL | Status: DC
  Administered 2020-10-22 – 2020-12-14 (×105): 45 mL
  Filled 2020-10-21 (×107): qty 45

## 2020-10-21 NOTE — Progress Notes (Signed)
Modified Barium Swallow Progress Note  Patient Details  Name: CASHAWN KUCERA MRN: [id number] Date of Birth: 18-Nov-1950  Today's Date: 10/21/2020  Modified Barium Swallow completed.  Full report located under Chart Review in the Imaging Section.  Brief recommendations include the following:  Clinical Impression  Pt unfortunately with very limited MBSS. Pt with eyes open during exam, responding some to simple commands but exhibited significant cognitive deficits impairing ability for swallow sequencing. Orally pt with persistent left sided oral motor deficits post CVA that contributed to left anterior spillage, oral holding of bolus, poor bolus cohesion, with minimal to no bolus manipulation. Despite multimodal cues pt was unable to propel any of tested consistencies to pharynx. Contents spilled from oral cavity and remainder was cleaned by SLP with oral care post exam. Attempted nectar thick via tsp and cup, and puree textures. Per chart review, this appears consistent with documented concern from nursing yesterday evening. Recommend NPO with consideration of short term means of alternative nutrition, continued SLP intervention with hopes of PO diet reinitiation as pt clinically tolerating. SLP notified MD and RN of recommendations and will continue to closely follow.        SLP Diet Recommendations: NPO;Alternative means - temporary       Medication Administration: Via alternative means               Oral Care Recommendations: Oral care QID        Hayden Rasmussen MA, CCC-SLP Acute Rehabilitation Services   10/21/2020,9:37 AM

## 2020-10-21 NOTE — Progress Notes (Signed)
Patient ID: Clifford [name], male   DOB: Aug 24, 1950, 70 y.o.   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  He follows commands well.  He continues to have dense left hemiplegia but purposeful movements on the right side against gravity.  Vital signs remained stable.  Neurological exam is unchanged OBJECTIVE Temp:  [97.6 F (36.4 C)-98.9 F (37.2 C)] 97.9 F (36.6 C) (08/18 1119) Pulse Rate:  [55-69] 69 (08/18 1119) Cardiac Rhythm: Normal sinus rhythm (08/18 0705) Resp:  [18-20] 20 (08/18 1119) BP: (143-168)/(62-82) 168/65 (08/18 1119) SpO2:  [98 %-100 %] 100 % (08/18 1119)  No results for input(s): GLUCAP in the last 168 hours. Recent Labs  Lab 10/15/20 0500 10/16/20 0346 10/20/20 0142 10/21/20 0300  NA 135 134* 135 136  K 3.6 4.1 3.8 4.1  CL 102 104 103 101  CO2 '23 24 24 23  '$ GLUCOSE 86 104* 118* 113*  BUN '18 15 18 19  '$ CREATININE 1.22 1.25* 1.40* 1.43*  CALCIUM 8.9 8.8* 8.8* 9.2  MG 1.8  --   --   --    Recent Labs  Lab 10/15/20 0500  AST 19  ALT 12  ALKPHOS 65  BILITOT 0.8  PROT 6.2*  ALBUMIN 3.0*   Recent Labs  Lab 10/15/20 0500 10/16/20 0346  WBC 4.3 4.4  NEUTROABS 1.8  --   HGB 11.8* 11.8*  HCT 37.9* 36.7*  MCV 83.8 81.9  PLT 193 169   No results for input(s): CKTOTAL, CKMB, CKMBINDEX, TROPONINI in the last 168 hours. No results for input(s): LABPROT, INR in the last 72 hours.  No results for input(s): COLORURINE, LABSPEC, [place], GLUCOSEU, HGBUR, BILIRUBINUR, KETONESUR, PROTEINUR, UROBILINOGEN, NITRITE, LEUKOCYTESUR in the last 72 hours.  Invalid input(s): APPERANCEUR     Component Value Date/Time   CHOL 128 10/07/2020 0450   TRIG 63 10/07/2020 0450   HDL 40 (L) 10/07/2020 0450   CHOLHDL 3.2 10/07/2020 0450   VLDL 13 10/07/2020 0450   LDLCALC 75 10/07/2020 0450   Lab Results  Component Value Date   HGBA1C 5.9 (H) 10/07/2020   No results  found for: LABOPIA, COCAINSCRNUR, LABBENZ, AMPHETMU, THCU, LABBARB  No results for input(s): ETH in the last 168 hours.  I have personally reviewed the radiological images below and agree with the radiology interpretations.  CT Angio Head W or Wo Contrast  Result Date: 10/06/2020 CLINICAL DATA:  Stroke. TIA. Assess intracranial arteries. Unsteady gait. Left-sided weakness. Left-sided visual loss. Headache. EXAM: CT ANGIOGRAPHY HEAD AND NECK TECHNIQUE: Multidetector CT imaging of the head and neck was performed using the standard protocol during bolus administration of intravenous contrast. Multiplanar CT image reconstructions and MIPs were obtained to evaluate the vascular anatomy. Carotid stenosis measurements (when applicable) are obtained utilizing NASCET criteria, using the distal internal carotid diameter as the denominator. CONTRAST:  163m OMNIPAQUE IOHEXOL 350 MG/ML SOLN COMPARISON:  Head CT earlier same day FINDINGS: CTA NECK FINDINGS Aortic arch: Aortic atherosclerosis.  Branching pattern is normal. Right carotid system: Common carotid artery is tortuous but widely patent to the bifurcation. Carotid bifurcation is widely patent. Minimal calcified plaque at the distal ICA bulb but no stenosis. Cervical ICA widely patent. Left carotid system: Common carotid artery is tortuous but widely patent to the bifurcation. Carotid bifurcation is normal. Minimal calcified plaque at the ICA bulb but no stenosis. Cervical ICA widely patent beyond that. Vertebral arteries: Right vertebral artery  origin is widely patent. Left vertebral artery origin is poorly seen because of regional venous reflux. Both vertebral arteries do appear patent through the cervical region to the foramen magnum. Skeleton: Ordinary cervical spondylosis. Other neck: No mass or lymphadenopathy. Upper chest: Upper lungs are clear. Review of the MIP images confirms the above findings CTA HEAD FINDINGS Anterior circulation: Both internal carotid  arteries are patent through the skull base and siphon regions. There is ordinary siphon atherosclerotic calcification. On the left, there is no stenosis. On the right, there is 50-70% stenosis in the siphon. Supraclinoid internal carotid arteries are widely patent. The anterior and middle cerebral vessels are patent. No large vessel occlusion, with particular attention to the right MCA. Posterior circulation: Both vertebral arteries are patent through the foramen magnum to the basilar. No basilar stenosis. Posterior circulation branch vessels are normal. Venous sinuses: Patent and normal. Anatomic variants: None significant. Review of the MIP images confirms the above findings IMPRESSION: No intracranial large vessel occlusion identified at this time, with specific attention to the right MCA branches. Minimal atherosclerotic change at both internal carotid artery bulbs. No stenosis. 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 No stenosis on the left. 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 Electronically Signed   By: Nelson Chimes M.D.   On: 10/06/2020 15:01   CT HEAD WO CONTRAST (5MM)  Result Date: 10/19/2020 CLINICAL DATA:  Follow-up stroke with altered mental status, initial encounter EXAM: CT HEAD WITHOUT CONTRAST TECHNIQUE: Contiguous axial images were obtained from the base of the skull through the vertex without intravenous contrast. COMPARISON:  CT from earlier in the same day. FINDINGS: Brain: There are again changes of edema and decreased attenuation in the distribution of the right posterior middle cerebral artery consistent with prior infarct. There is again noted some effacement of the right lateral ventricle as well as mild midline shift from right to left of approximately 4 mm. No acute hemorrhage is seen. No new infarct is noted. Vascular: No hyperdense vessel or unexpected calcification. Skull: Normal. Negative for fracture or focal lesion. Sinuses/Orbits: No acute finding.  Other: None. IMPRESSION: Evolving infarct involving the posterior aspect of the right middle cerebral artery. Persistent mass effect upon the right lateral ventricle is noted with mild midline shift. No new focal abnormality is seen. No herniation is seen. Electronically Signed   By: Inez Catalina M.D.   On: 10/19/2020 19:28   CT HEAD WO CONTRAST (5MM)  Result Date: 10/19/2020 CLINICAL DATA:  Stroke follow-up EXAM: CT HEAD WITHOUT CONTRAST TECHNIQUE: Contiguous axial images were obtained from the base of the skull through the vertex without intravenous contrast. COMPARISON:  None. FINDINGS: Brain: Evolving right posterior MCA territory infarct with increased area of cytotoxic edema now involving a greater portion of the temporal lobe. There is moderate mass effect on the right lateral ventricle. No hydrocephalus or entrapment. There is now leftward midline shift approximately 3 mm but no herniation. No acute hemorrhage. Vascular: Atherosclerotic calcification of the internal carotid arteries at the skull base. No abnormal hyperdensity of the major intracranial arteries or dural venous sinuses. Skull: The visualized skull base, calvarium and extracranial soft tissues are normal. Sinuses/Orbits: No fluid levels or advanced mucosal thickening of the visualized paranasal sinuses. No mastoid or middle ear effusion. The orbits are normal. IMPRESSION: 1. Evolving right posterior MCA territory infarct with increased area of cytotoxic edema now involving a greater portion of the temporal lobe. 2. No acute hemorrhage. 3. Moderate mass effect on the  right lateral ventricle with 3 mm leftward midline shift but no herniation. Electronically Signed   By: Ulyses Jarred M.D.   On: 10/19/2020 03:11   CT HEAD WO CONTRAST (5MM)  Result Date: 10/15/2020 CLINICAL DATA:  Neuro deficit, acute, stroke suspected. New focal defects. EXAM: CT HEAD WITHOUT CONTRAST TECHNIQUE: Contiguous axial images were obtained from the base of the  skull through the vertex without intravenous contrast. COMPARISON:  Head CT and MRI yesterday. FINDINGS: Brain: Confluent acute infarction in the right posterior temporal and temporoparietal junction regions shows discrete low-density by CT, with minimal petechial blood products but no frank hematoma. Smaller areas cortical and subcortical infarction in the right frontal and frontoparietal cortex and underlying white matter are seen by CT is subtle findings. No hemorrhage in that region. No new insult is identified. Mild swelling but no significant mass effect or any midline shift. No hydrocephalus. No extra-axial collection. Vascular: There is atherosclerotic calcification of the major vessels at the base of the brain. Skull: Negative Sinuses/Orbits: Clear/normal Other: None IMPRESSION: No apparent change since yesterday. Confluent infarction at the right posterior temporal and temporoparietal junction region with petechial blood products but no frank hematoma. Smaller more subtle infarctions of the cortical and subcortical brain in the right frontal and frontoparietal region appear unchanged. Electronically Signed   By: Nelson Chimes M.D.   On: 10/15/2020 15:16   CT HEAD WO CONTRAST (5MM)  Result Date: 10/14/2020 CLINICAL DATA:  Right-sided headache, recent stroke EXAM: CT HEAD WITHOUT CONTRAST TECHNIQUE: Contiguous axial images were obtained from the base of the skull through the vertex without intravenous contrast. COMPARISON:  10/06/2020 CT head, 10/07/2020 MRI head FINDINGS: Brain: Area of hypodensity in the right parietal and occipital lobes, which correlates with the area of infarct seen on the 10/06/2020 CT and 10/07/2020 MRI, with edema in this area. Subtle areas of hyperdensity, likely petechial hemorrhage. No evidence of hemorrhagic transformation. No new areas of infarction. No significant midline shift. No mass or extra-axial collection. Vascular: No hyperdense vessel or unexpected calcification.  Skull: Normal. Negative for fracture or focal lesion. Sinuses/Orbits: Mucosal thickening in the left frontal sinus and bilateral ethmoid air cells. The orbits are unremarkable. Other: None. IMPRESSION: Expected evolution of the previously noted right MCA/PCA territory infarct, with petechial hemorrhage and mild edema, without midline shift or hemorrhagic transformation. Electronically Signed   By: Merilyn Baba MD   On: 10/14/2020 10:02   CT HEAD WO CONTRAST (5MM)  Result Date: 10/06/2020 CLINICAL DATA:  Neuro deficit, acute, stroke suspected. Steady gait, left-sided weakness and left-sided vision loss, increased blood pressure, headache for 3 days, history of hypertension. EXAM: CT HEAD WITHOUT CONTRAST TECHNIQUE: Contiguous axial images were obtained from the base of the skull through the vertex without intravenous contrast. COMPARISON:  No pertinent prior exams available for comparison. FINDINGS: Brain: Cerebral volume is normal for age. Region of abnormal cortical/subcortical hypodensity measuring 6.3 x 3.3 x 5.8 cm within the right parietooccipital lobes compatible with acute/early subacute infarction (right PCA vascular territory and right MCA/PCA watershed territory). No significant mass effect at this time. No evidence of hemorrhagic conversion. Chronic appearing lacunar infarct within the left lentiform nucleus. No extra-axial fluid collection. No evidence of an intracranial mass. No midline shift. Vascular: No hyperdense vessel.  Atherosclerotic calcifications. Skull: Normal. Negative for fracture or focal lesion. Sinuses/Orbits: Visualized orbits show no acute finding. Mild mucosal thickening within the frontal sinuses bilaterally. Mild-to-moderate mucosal thickening within the bilateral ethmoid air cells. These results were discussed  by telephone at the time of interpretation on 10/06/2020 at 1:55 pm with provider MATTHEW TRIFAN , who verbally acknowledged these results. IMPRESSION: 6.3 x 3.3 x 5.8 cm  acute/early subacute cortical and subcortical infarct within the right parietooccipital lobes (right PCA vascular territory and right MCA/PCA watershed territory). Consider MR or CT angiography for further evaluation. Chronic left basal ganglia lacunar infarct. Paranasal sinus disease at the imaged levels, as described. Electronically Signed   By: Kellie Simmering DO   On: 10/06/2020 14:13   CT Angio Neck W and/or Wo Contrast  Result Date: 10/06/2020 CLINICAL DATA:  Stroke. TIA. Assess intracranial arteries. Unsteady gait. Left-sided weakness. Left-sided visual loss. Headache. EXAM: CT ANGIOGRAPHY HEAD AND NECK TECHNIQUE: Multidetector CT imaging of the head and neck was performed using the standard protocol during bolus administration of intravenous contrast. Multiplanar CT image reconstructions and MIPs were obtained to evaluate the vascular anatomy. Carotid stenosis measurements (when applicable) are obtained utilizing NASCET criteria, using the distal internal carotid diameter as the denominator. CONTRAST:  14m OMNIPAQUE IOHEXOL 350 MG/ML SOLN COMPARISON:  Head CT earlier same day FINDINGS: CTA NECK FINDINGS Aortic arch: Aortic atherosclerosis.  Branching pattern is normal. Right carotid system: Common carotid artery is tortuous but widely patent to the bifurcation. Carotid bifurcation is widely patent. Minimal calcified plaque at the distal ICA bulb but no stenosis. Cervical ICA widely patent. Left carotid system: Common carotid artery is tortuous but widely patent to the bifurcation. Carotid bifurcation is normal. Minimal calcified plaque at the ICA bulb but no stenosis. Cervical ICA widely patent beyond that. Vertebral arteries: Right vertebral artery origin is widely patent. Left vertebral artery origin is poorly seen because of regional venous reflux. Both vertebral arteries do appear patent through the cervical region to the foramen magnum. Skeleton: Ordinary cervical spondylosis. Other neck: No mass or  lymphadenopathy. Upper chest: Upper lungs are clear. Review of the MIP images confirms the above findings CTA HEAD FINDINGS Anterior circulation: Both internal carotid arteries are patent through the skull base and siphon regions. There is ordinary siphon atherosclerotic calcification. On the left, there is no stenosis. On the right, there is 50-70% stenosis in the siphon. Supraclinoid internal carotid arteries are widely patent. The anterior and middle cerebral vessels are patent. No large vessel occlusion, with particular attention to the right MCA. Posterior circulation: Both vertebral arteries are patent through the foramen magnum to the basilar. No basilar stenosis. Posterior circulation branch vessels are normal. Venous sinuses: Patent and normal. Anatomic variants: None significant. Review of the MIP images confirms the above findings IMPRESSION: No intracranial large vessel occlusion identified at this time, with specific attention to the right MCA branches. Minimal atherosclerotic change at both internal carotid artery bulbs. No stenosis. 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 No stenosis on the left. 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 Electronically Signed   By: MNelson ChimesM.D.   On: 10/06/2020 15:01   MR BRAIN WO CONTRAST  Result Date: 10/14/2020 CLINICAL DATA:  Stroke, follow up.  Eval for progression of stroke. EXAM: MRI HEAD WITHOUT CONTRAST TECHNIQUE: Multiplanar, multiecho pulse sequences of the brain and surrounding structures were obtained without intravenous contrast. COMPARISON:  Head CT 10/14/2020 and MRI 10/07/2020 FINDINGS: The study is intermittently motion degraded including severe motion on the coronal T2 sequence. Brain: The moderately large infarct involving the posterior right MCA territory and PCA border zone has mildly expanded compared to the prior MRI, most notable anteriorly and superiorly. Associated petechial hemorrhage  is partially confluent  and has increased from the prior MRI. There is progressive cytotoxic edema with regional sulcal effacement and mass effect on the right lateral ventricle. New mildly restricted diffusion in the splenium of the corpus callosum may reflect acute wallerian degeneration. Small cortical and subcortical infarcts in the right frontal lobe (border zone distribution) have also mildly increased from the prior MRI. Small T2 hyperintensities elsewhere in the cerebral white matter bilaterally are similar to the prior MRI and are nonspecific but compatible with mild chronic small vessel ischemic disease. There is no midline shift or extra-axial fluid collection. A chronic hemorrhagic infarct is again noted in the posterior aspect of the left lentiform nucleus/external capsule. Vascular: Major intracranial vascular flow voids are preserved. Skull and upper cervical spine: Unremarkable bone marrow signal para Sinuses/Orbits: Unremarkable orbits. Mild mucosal thickening in the paranasal sinuses. Clear mastoid air cells. Other: None. IMPRESSION: 1. Mild enlargement of the right MCA/border zone infarct since the 10/07/2020 MRI. Increased cytotoxic edema and petechial hemorrhage without midline shift. 2. Chronic small-vessel ischemia including an old hemorrhagic infarct in the left basal ganglia. Electronically Signed   By: Logan Bores M.D.   On: 10/14/2020 18:22   MR BRAIN WO CONTRAST  Result Date: 10/07/2020 CLINICAL DATA:  Neuro deficit, acute, stroke suspected. Additional history provided: Vision change and mild left-sided weakness which began 2 days ago. EXAM: MRI HEAD WITHOUT CONTRAST TECHNIQUE: Multiplanar, multiecho pulse sequences of the brain and surrounding structures were obtained without intravenous contrast. COMPARISON:  Noncontrast head CT 10/06/2020. CT angiogram head/neck 10/06/2020. FINDINGS: Brain: Cerebral volume is normal. Region of abnormal cortical/subcortical restricted diffusion within the right parietal  and occipital lobes measuring 6.2 x 3.3 x 6.4 cm compatible with acute/early subacute infarction (at the junction of the right MCA and PCA vascular territories). This has not significantly changed in extent as compared to the head CT performed yesterday 10/06/2020. Mild T1 hyperintensity and SWI signal loss within the infarction territory compatible with petechial hemorrhage and possibly cortical laminar necrosis. No significant mass effect at this time. There are numerous additional small patchy cortical and subcortical acute/early subacute infarcts within the right frontal and parietal lobes (watershed distribution). Background mild multifocal T2/FLAIR hyperintensity within the cerebral white matter, nonspecific but compatible with chronic small vessel ischemic disease. Chronic lacunar infarct with associated chronic hemosiderin deposition within the left basal ganglia/posterior limb of left internal capsule. No evidence of an intracranial mass. No extra-axial fluid collection. No midline shift. Vascular: Maintained proximal arterial flow voids. Skull and upper cervical spine: No focal suspicious marrow lesion. Incompletely assessed cervical spondylosis. Sinuses/Orbits: Visualized orbits show no acute finding. 11 mm left maxillary sinus mucous retention cyst. Moderate bilateral ethmoid sinus mucosal thickening. Trace mucosal thickening within the left frontal sinus. IMPRESSION: 6.2 x 6.4 cm acute/early subacute cortical and subcortical infarction within the right parietal and occipital lobes (at the junction of the right MCA and PCA territories). The infarct has not significantly changed in extent as compared to the head CT of 10/06/2020. Mild petechial hemorrhage within the infarction territory. No significant mass effect at this time. Numerous additional small patchy cortical and subcortical acute/early subacute infarcts within the right frontal and parietal lobes (watershed distribution). Chronic hemorrhagic  lacunar infarct within the left basal ganglia/posterior limb of left internal capsule. Background mild chronic small vessel ischemic changes within the cerebral white matter. Paranasal sinus disease at the imaged levels, as described. Electronically Signed   By: Kellie Simmering DO   On: 10/07/2020 07:57  DG Swallowing Func-Speech Pathology  Result Date: 10/21/2020 Table formatting from the original result was not included. Objective Swallowing Evaluation: Type of Study: MBS-Modified Barium Swallow Study  Patient Details Name: Clifford [name] MRN: [id number] Date of Birth: [birth year] Today's Date: 10/21/2020 Time: SLP Start Time (ACUTE ONLY): 0900 -SLP Stop Time (ACUTE ONLY): 0910 SLP Time Calculation (min) (ACUTE ONLY): 10 min Past Medical History: Past Medical History: Diagnosis Date  BPH (benign prostatic hyperplasia)   Chronic anticoagulation 10/14/2020  Essential hypertension 10/06/2020  History of pulmonary embolism 10/14/2020  HLD (hyperlipidemia)   HTN (hypertension)   Lung nodule   43m in 2015  Malignant neoplasm of left kidney ([place]   Mixed hyperlipidemia 10/06/2020  Nicotine dependence, cigarettes, uncomplicated [id number] Peripheral vascular disease ([place]   Polyp of colon   Prediabetes 10/14/2020  [place]   [place] Past Surgical History: Past Surgical History: Procedure Laterality Date  COLONOSCOPY  03/2017  PARTIAL NEPHRECTOMY Left 02/2018  PROSTATE BIOPSY  2015 HPI: [id number] admitted 10/14/20 with right side headache and LUE weakness. 10/19/20 pt was noted to have increased LUE weakness. 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 CTHead = evolving RMCA infarct with cytotoxic edema, mass effect  Subjective: alert, requires cueing Assessment / Plan / Recommendation CH[facility] IP CLINICAL IMPRESSIONS 10/21/2020 Clinical Impression Pt unfortunately with very limited MBSS. Pt with eyes open during exam, responding some to  simple commands but exhibited significant cognitive deficits impairing ability for swallow sequencing. Orally pt with persistent left sided oral motor deficits post CVA that contributed to left anterior spillage, oral holding of bolus, poor bolus cohesion, with minimal to no bolus manipulation. Despite multimodal cues pt was unable to propel any of tested consistencies to pharynx. Contents spilled from oral cavity and remainder was cleaned by SLP with oral care post exam. Attempted nectar thick via tsp and cup, and puree textures. Per chart review, this appears consistent with documented concern from nursing yesterday evening. Recommend NPO with consideration of short term means of alternative nutrition, continued SLP intervention with hopes of PO diet reinitiation as pt clinically tolerating. SLP notified MD and RN of recommendations and will continue to closely follow. SLP Visit Diagnosis Dysphagia, oral phase (R13.11) Attention and concentration deficit following -- Frontal lobe and executive function deficit following -- Impact on safety and function Moderate aspiration risk;Severe aspiration risk;Risk for inadequate nutrition/hydration   [facility] IP TREATMENT RECOMMENDATION 10/21/2020 Treatment Recommendations Therapy as outlined in treatment plan below   Prognosis 10/21/2020 Prognosis for Safe Diet Advancement Fair Barriers to Reach Goals Cognitive deficits;Severity of deficits Barriers/Prognosis Comment -- [facility] IP DIET RECOMMENDATION 10/21/2020 SLP Diet Recommendations NPO;Alternative means - temporary Liquid Administration via -- Medication Administration Via alternative means Compensations -- Postural Changes --   [facility] IP OTHER RECOMMENDATIONS 10/21/2020 Recommended Consults -- Oral Care Recommendations Oral care QID Other Recommendations --   [facility] IP FOLLOW UP RECOMMENDATIONS 10/21/2020 Follow up Recommendations 24 hour supervision/assistance;[facility] D[facility] Speech  Therapy Frequency (ACUTE ONLY) min 3x week Treatment Duration 2 weeks      CH[facility] IP ORAL PHASE 10/21/2020 Oral Phase Impaired Oral - Pudding Teaspoon -- Oral - Pudding Cup -- Oral - Honey Teaspoon -- Oral - Honey Cup -- Oral - Nectar Teaspoon Left anterior bolus loss;Weak lingual manipulation;Reduced posterior propulsion;Holding of bolus;Pocketing in anterior sulcus;Lingual/palatal residue;Delayed oral transit;Decreased bolus cohesion Oral - Nectar Cup Left  anterior bolus loss;Weak lingual manipulation;Reduced posterior propulsion;Holding of bolus;Left pocketing in lateral sulci;Pocketing in anterior sulcus;Lingual/palatal residue;Delayed oral transit Oral - Nectar Straw -- Oral - Thin Teaspoon -- Oral - Thin Cup -- Oral - Thin Straw -- Oral - Puree Reduced posterior propulsion;Holding of bolus;Left pocketing in lateral sulci;Lingual/palatal residue;Delayed oral transit;Decreased bolus cohesion Oral - Mech Soft -- Oral - Regular -- Oral - Multi-Consistency -- Oral - Pill -- Oral Phase - Comment --  CHL IP PHARYNGEAL PHASE 10/21/2020 Pharyngeal Phase Impaired Pharyngeal- Pudding Teaspoon -- Pharyngeal -- Pharyngeal- Pudding Cup -- Pharyngeal -- Pharyngeal- Honey Teaspoon -- Pharyngeal -- Pharyngeal- Honey Cup -- Pharyngeal -- Pharyngeal- Nectar Teaspoon Other (Comment) Pharyngeal -- Pharyngeal- Nectar Cup Other (Comment) Pharyngeal -- Pharyngeal- Nectar Straw -- Pharyngeal -- Pharyngeal- Thin Teaspoon -- Pharyngeal -- Pharyngeal- Thin Cup -- Pharyngeal -- Pharyngeal- Thin Straw -- Pharyngeal -- Pharyngeal- Puree Other (Comment) Pharyngeal -- Pharyngeal- Mechanical Soft -- Pharyngeal -- Pharyngeal- Regular -- Pharyngeal -- Pharyngeal- Multi-consistency -- Pharyngeal -- Pharyngeal- Pill -- Pharyngeal -- Pharyngeal Comment --  No flowsheet data found. Lisle MA, CCC-SLP 10/21/2020, 9:35 AM              EEG adult  Result Date: 10/15/2020 Lora Havens, MD     10/15/2020 10:31 AM Patient Name: Clifford [name]  MRN: [id number] Epilepsy Attending: Lora Havens Referring Physician/Provider: Dr Lesleigh Noe Date: 10/15/2020 Duration: 23.02 mins Patient history: 70 year old male with posterior right parietal watershed infarct who presented with worsening stroke symptoms as well as left thumb rhythmic and nonsuppressible shaking movements.  EEG to evaluate for seizures. Level of alertness: Awake, asleep AEDs during EEG study: LEV Technical aspects: This EEG study was done with scalp electrodes positioned according to the 10-20 International system of electrode placement. Electrical activity was acquired at a sampling rate of '500Hz'$  and reviewed with a high frequency filter of '70Hz'$  and a low frequency filter of '1Hz'$ . EEG data were recorded continuously and digitally stored. Description: The posterior dominant rhythm consists of 8-9 Hz activity of moderate voltage (25-35 uV) seen predominantly in posterior head regions, asymmetric ( R<L) and reactive to eye opening and eye closing. Sleep was characterized by vertex waves, sleep spindles (12 to 14 Hz), maximal frontocentral region.  EEG showed continuous rhythmic 3 to 5 Hz theta-delta slowing in right hemisphere. Hyperventilation and photic stimulation were not performed.   ABNORMALITY - Continuous slow, right hemisphere - Background asymmetry, right<left IMPRESSION: This study is suggestive of cortical dysfunction in right hemisphere, likely secondary to underlying stroke.  No seizures or epileptiform discharges were seen throughout the recording. Priyanka Barbra Sarks   Overnight EEG with video  Result Date: 10/16/2020 Lora Havens, MD     10/16/2020  3:23 PM Patient Name: Clifford [name] MRN: [id number] Epilepsy Attending: Lora Havens Referring Physician/Provider: Dr Rosalin Hawking Duration: 10/15/2020 1639 to 10/16/2020 0230  Patient history: 70 year old male with posterior right parietal watershed infarct who presented with worsening stroke symptoms as well as left thumb  rhythmic and nonsuppressible shaking movements.  EEG to evaluate for seizures.  Level of alertness: Awake, asleep  AEDs during EEG study: LEV  Technical aspects: This EEG study was done with scalp electrodes positioned according to the 10-20 International system of electrode placement. Electrical activity was acquired at a sampling rate of '500Hz'$  and reviewed with a high frequency filter of '70Hz'$  and a low frequency filter of '1Hz'$ . EEG data were recorded continuously and digitally stored.  Description: The  posterior dominant rhythm consists of 8-9 Hz activity of moderate voltage (25-35 uV) seen predominantly in posterior head regions, asymmetric ( R<L) and reactive to eye opening and eye closing. Sleep was characterized by vertex waves, sleep spindles (12 to 14 Hz), maximal frontocentral region.  EEG showed continuous rhythmic 3 to 5 Hz theta-delta slowing in right hemisphere. Hyperventilation and photic stimulation were not performed. EEG was not interpretable after 0230 on 10/16/2020 due to significant electrode artifact.   ABNORMALITY - Continuous slow, right hemisphere - Background asymmetry, right<left  IMPRESSION: This study is suggestive of cortical dysfunction in right hemisphere, likely secondary to underlying stroke.  No seizures or epileptiform discharges were seen throughout the recording.  Lora Havens   ECHOCARDIOGRAM COMPLETE  Result Date: 10/07/2020    ECHOCARDIOGRAM REPORT   Patient Name:   Clifford [name] Date of Exam: 10/07/2020 Medical Rec #:  [id number]    Height:       65.0 in Accession #:    [id number]   Weight: Date of Birth:  1950/12/24     BSA: Patient Age:    65 years     BP:           115/58 mmHg Patient Gender: M            HR:           66 bpm. Exam Location:  Inpatient Procedure: 2D Echo, Cardiac Doppler and Color Doppler Indications:    Stroke I63.9  History:        Patient has no prior history of Echocardiogram examinations.                 Risk Factors:Hypertension and Dyslipidemia. PVD.  PE.  Sonographer:    Vickie Epley RDCS Referring Phys: [place]  1. Left ventricular ejection fraction, by estimation, is 65 to 70%. The left ventricle has hyperdynamic function. The left ventricle has no regional wall motion abnormalities. Left ventricular diastolic parameters are consistent with Grade I diastolic dysfunction (impaired relaxation).  2. Right ventricular systolic function is normal. The right ventricular size is normal. Tricuspid regurgitation signal is inadequate for assessing PA pressure.  3. The mitral valve is normal in structure. No evidence of mitral valve regurgitation. No evidence of mitral stenosis.  4. The aortic valve is tricuspid. Aortic valve regurgitation is not visualized. No aortic stenosis is present.  5. The inferior vena cava is normal in size with greater than 50% respiratory variability, suggesting right atrial pressure of 3 mmHg. FINDINGS  Left Ventricle: Left ventricular ejection fraction, by estimation, is 65 to 70%. The left ventricle has hyperdynamic function. The left ventricle has no regional wall motion abnormalities. The left ventricular internal cavity size was normal in size. There is no left ventricular hypertrophy. Left ventricular diastolic parameters are consistent with Grade I diastolic dysfunction (impaired relaxation). Right Ventricle: The right ventricular size is normal. No increase in right ventricular wall thickness. Right ventricular systolic function is normal. Tricuspid regurgitation signal is inadequate for assessing PA pressure. Left Atrium: Left atrial size was normal in size. Right Atrium: Right atrial size was normal in size. Pericardium: There is no evidence of pericardial effusion. Mitral Valve: The mitral valve is normal in structure. No evidence of mitral valve regurgitation. No evidence of mitral valve stenosis. Tricuspid Valve: The tricuspid valve is normal in structure. Tricuspid valve regurgitation is not demonstrated.  Aortic Valve: The aortic valve is tricuspid. Aortic valve regurgitation is not visualized. No aortic stenosis  is present. Pulmonic Valve: The pulmonic valve was normal in structure. Pulmonic valve regurgitation is not visualized. Aorta: The aortic root is normal in size and structure. Venous: The inferior vena cava is normal in size with greater than 50% respiratory variability, suggesting right atrial pressure of 3 mmHg. IAS/Shunts: No atrial level shunt detected by color flow Doppler.  LEFT VENTRICLE PLAX 2D LVIDd:         3.60 cm      Diastology LVIDs:         2.50 cm      LV e' medial:    6.00 cm/s LV PW:         1.00 cm      LV E/e' medial:  7.4 LV IVS:        1.10 cm      LV e' lateral:   7.62 cm/s LVOT diam:     2.20 cm      LV E/e' lateral: 5.8 LV SV:         97 LVOT Area:     3.80 cm  LV Volumes (MOD) LV vol d, MOD A2C: 98.1 ml LV vol d, MOD A4C: 108.0 ml LV vol s, MOD A2C: 39.6 ml LV vol s, MOD A4C: 40.2 ml LV SV MOD A2C:     58.5 ml LV SV MOD A4C:     108.0 ml LV SV MOD BP:      64.0 ml RIGHT VENTRICLE RV S prime:     14.80 cm/s TAPSE (M-mode): 2.3 cm LEFT ATRIUM           RIGHT ATRIUM LA diam:      3.20 cm RA Area:     14.70 cm LA Vol (A2C): 31.0 ml RA Volume:   36.60 ml LA Vol (A4C): 24.7 ml  AORTIC VALVE LVOT Vmax:   129.00 cm/s LVOT Vmean:  96.600 cm/s LVOT VTI:    0.254 m  AORTA Ao Root diam: 3.50 cm Ao Asc diam:  3.50 cm MITRAL VALVE MV Area (PHT): 2.41 cm    SHUNTS MV Decel Time: 315 msec    Systemic VTI:  0.25 m MV E velocity: 44.50 cm/s  Systemic Diam: 2.20 cm MV A velocity: 65.60 cm/s MV E/A ratio:  0.68 Loralie Champagne MD Electronically signed by Loralie Champagne MD Signature Date/Time: 10/07/2020/5:59:38 PM    Final     TTE  IMPRESSIONS:  1. Left ventricular ejection fraction, by estimation, is 65 to 70%. The  left ventricle has hyperdynamic function. The left ventricle has no  regional wall motion abnormalities. Left ventricular diastolic parameters  are consistent with Grade I diastolic   dysfunction (impaired relaxation).   2. Right ventricular systolic function is normal. The right ventricular  size is normal. Tricuspid regurgitation signal is inadequate for assessing  PA pressure.   3. The mitral valve is normal in structure. No evidence of mitral valve  regurgitation. No evidence of mitral stenosis.   4. The aortic valve is tricuspid. Aortic valve regurgitation is not  visualized. No aortic stenosis is present.   5. The inferior vena cava is normal in size with greater than 50%  respiratory variability, suggesting right atrial pressure of 3 mmHg.   EEG: IMPRESSION: This study is suggestive of cortical dysfunction in right hemisphere, likely secondary to underlying stroke.  No seizures or epileptiform discharges were seen throughout the recording.    PHYSICAL EXAM  Temp:  [97.6 F (36.4 C)-98.9 F (37.2 C)] 97.9 F (36.6 C) (  08/18 1119) Pulse Rate:  [55-69] 69 (08/18 1119) Resp:  [18-20] 20 (08/18 1119) BP: (143-168)/(62-82) 168/65 (08/18 1119) SpO2:  [98 %-100 %] 100 % (08/18 1119)  General -obese elderly African-American male in no apparent distress.  Ophthalmologic - fundi not visualized due to noncooperation.  Cardiovascular - Regular rhythm and rate.  Mental Status -  Patient is awake and interactive and follows commands well.  He is oriented to time place and person.  Speech and language is intact.  Cranial Nerves II - XII - II - Visual field intact OU. III, IV, VI - Extraocular movements intact. V - Facial sensation intact bilaterally. VII - Facial movement intact bilaterally. VIII - Hearing & vestibular intact bilaterally. X - Palate elevates symmetrically. XII - Tongue protrusion intact.  Motor Strength - The patient's strength was normal 5/5 right hemibody.  Left hemiparesis.  With 0/5 strength.  Reflexes - The patient's reflexes were symmetrical in all extremities and he had no pathological reflexes.  Sensory - Light touch intact b/l.   Mild inattention on the left.  Coordination - no twitching noted today. Normal tone.   Gait and Station - deferred.   ASSESSMENT/PLAN Clifford [name] is a 70 y.o. 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 no tPA given due to risk of bleeding  Increased petechial hemorrhage/evolution of previous right MCA/PCA territory infarct with mild edema with mild 3 mm midline shift on repeat CT 10/19/2020  MRI mild enlargement with cytotoxic edema and petechial hemorrhage without midline shift compared to 8/4 2D Echo grade 1 diastolic dysfunction, EF 65 to 70% LDL 75 HgbA1c 5.9% SCDs for VTE prophylaxis Pradaxa (dabigatran) twice a day prior to admission, now on No antithrombotic.  Therapy recommendations:  pending Disposition:  pending  LUE non suppressible twitching/weakness, concern for focal seizure activity LTM right hemispheric slowing but no definite epileptiform activity  continue keppra. Since he is stable, no need for additional AED. Continue to monitor.  Hypertension Stable SBP goal 130-150 Long term BP goal normotensive  Hyperlipidemia Home meds:  lipitor 80 mg  LDL 75, goal < 70 on lipitor 40 mg Continue statin at discharge  Other Stroke Risk Factors Advanced age Cigarette smoker, advised to stop smoking Obesity, Body mass index is 35.11 kg/m.  Hx stroke/TIA  Hospital day # 7 Patient   continues to have his significant left hemiplegia and some eye-opening apraxia but otherwise neurologically stable.  Continue Pradaxa for secondary stroke prevention when patient is able to swallow.  Speech therapy to do  modified barium swallow.   Patient is unable to swallow and failed modified barium swallow hence recommend core track tube for tube feedings and medications..  Discussed with Dr. Tamsen Meek.  And patient's daughter at the bedside.     Greater than 50% time during this 25-minute visit was spent on counseling and coordination of care and discussion with care team.  Antony Contras MD   To contact Stroke Continuity provider, please refer to [email or web address] After hours, contact General Neurology

## 2020-10-21 NOTE — Progress Notes (Signed)
Verbal order to give morning medications.  Attempted to give pt medications crushed in applesauce.  Pt holding applesauce with pills in mouth.  Multiple attempts to get pt to swallow.  Pt unable to swallow and pills sitting in mouth.  Did not continue to administer.  Suctioned mouth and provided oral care.  MD notified.

## 2020-10-21 NOTE — Telephone Encounter (Signed)
Transitions of Care Pharmacy   Call attempted for a pharmacy transitions of care follow-up. HIPAA appropriate voicemail was left with call back information provided.   Call attempt #3. Will not attempt follow up for [facility] pharmacy.

## 2020-10-21 NOTE — Progress Notes (Signed)
IP rehab admissions - I met with patient at the bedside.  I also spoke with Kathlee Nations, Education officer, museum.  I feel that patient likely will need SNF placement due to functional deficits and slower rate of recovery.  Call for questions.  [phone]

## 2020-10-21 NOTE — Progress Notes (Addendum)
PROGRESS NOTE    Clifford [name]  [id number] DOB: 10/12/1950 DOA: 10/14/2020 PCP: Pcp, No   Chief Complain:AMS  Brief Narrative:  Patient is a 70 year old male with history of diastolic congestive heart failure, hypertension, hyperlipidemia, PE on anticoagulation, recent right MCA/PCA watershed ischemic stroke who presented with increasing left upper arm weakness, left visual defect.  MRI done on presentation showed evolving/increased size of stroke, petechial hemorrhages, cytotoxic edema, concern for new seizures.  Neurology was consulted and were following.  In the early morning of 10/19/2020, patient was noticed to have increased left upper extremity weakness.  CT head follow-up again showed evolving right posterior MCA territory infarct with increased cytotoxic edema, mass-effect.  Neurology re-consulted.Plan is to continue same management.  He has developed severe dysphagia secondary to the recurrent stroke, speech therapy now recommending NPO.  We have consulted dietitian for starting on tube feeding for nutrition. Eventual plan is to discharge to SnF as per PT/OT   Assessment & Plan:   Principal Problem:   Left-sided weakness Active Problems:   Stroke [facility])   Essential hypertension   Mixed hyperlipidemia   Prediabetes   History of pulmonary embolism   Chronic anticoagulation   Nicotine dependence, cigarettes, uncomplicated   Petechial hemorrhage   Acute stroke due to ischemia [facility])   Left-sided weakness: Secondary to worsening right parietal occipital MCA/PCA watershed stroke complicated by worsening petechial hemorrhage.  MRI of the brain reviewed.  Pradaxa was held now restarted.  Repeat CT head on 8/12 did not show any apparent change.  Neurology were following.  His left-sided weakness was improving.  In the early morning of 10/19/2020, patient was noticed to have increased left upper extremity weakness.  CT head follow-up showed evolving right posterior MCA territory infarct  with increased cytotoxic edema, mass-effect.  Neurology re-consulted.  Now he has dense hemiplegia on the left side.  Plan is to continue Pradaxa Eventual plan is to discharge to SNF as per PT/OT   Severe dysphagia: Became dysphasic during this hospital after recurrent stroke.  Speech therapy did MBS, recommended NPO.  We have requested core track team to insert a feeding tube for feeding purposes and medicines.  Hopefully this will be short-term/temporary means and we expect improvement in his swallowing in the next few days  Concern for seizure: Likely precipitated in the setting of worsening stroke with petechial hemorrhage.  EEG showed cortical dysfunction , no epileptiform discharges.  Currently on Keppra.  Hypertension: Continue monitoring blood pressure.   On hydrochlorothiazide and losartan at home.  These medications are on hold due to AKI, started on amlodipine 10 mg daily  AKI: Most likely secondary to poor oral intake.  Will start on gentle IV fluids.  Monitor BMP  Sinus bradycardia: Asymptomatic.  Continue to monitor.  Hyperlipidemia: On Lipitor.  Recent LDL of 75.  Prediabetes: Monitor blood sugars.  Carbohydrate consistent diet.  History of multiple pulmonary embolism: On lifelong anticoagulation.  Takes Pradaxa at home which was held due to finding of petechial hemorrhage,now restarted  Tobacco abuse: Counseled cessation.  Declined nicotine patch  Morbid obesity: BMI of 35.1.  We recommend weight loss, healthy lifestyle, PCP follow-up           DVT prophylaxis:Pradaxa Code Status: Full Family Communication: Discussed with  brother on phone  on 10/21/2020  Remains inpatient appropriate because:Unsafe d/c plan  Dispo: The patient is from: Home              Anticipated d/c is to: SNF  Patient currently is not medically stable for dc   Difficult to place patient No     Consultants: neurology  Procedures:EEG  Antimicrobials:  Anti-infectives  (From admission, onward)    None       Subjective:  Patient seen and examined at the bedside this morning.  Overall hemodynamically stable during my evaluation.  His mental status might have slightly improved from yesterday.  He is still very weak, mostly bedbound, has dense left hemiplegia, has severe dysphagia.  Objective: Vitals:   10/20/20 1515 10/20/20 2020 10/21/20 0100 10/21/20 0500  BP: (!) 149/91 (!) 146/62 (!) 143/70 (!) 156/73  Pulse: 83 (!) 55 (!) 59 (!) 59  Resp: '16  18 20  '$ Temp: 98.5 F (36.9 C) 98.4 F (36.9 C) 98.5 F (36.9 C) 98.9 F (37.2 C)  TempSrc: Oral Oral Axillary Oral  SpO2: 98% 100% 98% 98%  Weight:      Height:        Intake/Output Summary (Last 24 hours) at 10/21/2020 0823 Last data filed at 10/21/2020 0530 Gross per 24 hour  Intake 210 ml  Output 1100 ml  Net -890 ml   Filed Weights   10/14/20 0840  Weight: 95.7 kg    Examination:   General exam: Very deconditioned, debilitated, weak, obese HEENT: PERRL Respiratory system:  no wheezes or crackles  Cardiovascular system: S1 & S2 heard, RRR.  Gastrointestinal system: Abdomen is nondistended, soft and nontender. Central nervous system: Not alert or oriented.  Opens his eyes and tries to speak on calling his name, dense left hemiplegia Extremities: No edema, no clubbing ,no cyanosis Skin: No rashes, no ulcers,no icterus     Data Reviewed: I have personally reviewed following labs and imaging studies  CBC: Recent Labs  Lab 10/14/20 0913 10/14/20 0920 10/15/20 0500 10/16/20 0346  WBC  --  4.3 4.3 4.4  NEUTROABS  --  2.6 1.8  --   HGB 14.3 13.5 11.8* 11.8*  HCT 42.0 42.1 37.9* 36.7*  MCV  --  81.6 83.8 81.9  PLT  --  227 193 [id number]   Basic Metabolic Panel: Recent Labs  Lab 10/14/20 0920 10/15/20 0500 10/16/20 0346 10/20/20 0142 10/21/20 0300  NA 137 135 134* 135 136  K 3.7 3.6 4.1 3.8 4.1  CL 104 102 104 103 101  CO2 '22 23 24 24 23  '$ GLUCOSE 117* 86 104* 118* 113*  BUN  '17 18 15 18 19  '$ CREATININE 1.30* 1.22 1.25* 1.40* 1.43*  CALCIUM 9.2 8.9 8.8* 8.8* 9.2  MG  --  1.8  --   --   --    GFR: Estimated Creatinine Clearance: 50.4 mL/min (A) (by C-G formula based on SCr of 1.43 mg/dL (H)). Liver Function Tests: Recent Labs  Lab 10/14/20 0920 10/15/20 0500  AST 22 19  ALT 13 12  ALKPHOS 77 65  BILITOT 1.0 0.8  PROT 7.1 6.2*  ALBUMIN 3.6 3.0*   No results for input(s): LIPASE, AMYLASE in the last 168 hours. No results for input(s): AMMONIA in the last 168 hours. Coagulation Profile: Recent Labs  Lab 10/14/20 0920  INR 1.4*   Cardiac Enzymes: No results for input(s): CKTOTAL, CKMB, CKMBINDEX, TROPONINI in the last 168 hours. BNP (last 3 results) No results for input(s): PROBNP in the last 8760 hours. HbA1C: No results for input(s): HGBA1C in the last 72 hours. CBG: No results for input(s): GLUCAP in the last 168 hours. Lipid Profile: No results for input(s): CHOL, HDL, LDLCALC, TRIG,  CHOLHDL, LDLDIRECT in the last 72 hours. Thyroid Function Tests: No results for input(s): TSH, T4TOTAL, FREET4, T3FREE, THYROIDAB in the last 72 hours. Anemia Panel: No results for input(s): VITAMINB12, FOLATE, FERRITIN, TIBC, IRON, RETICCTPCT in the last 72 hours. Sepsis Labs: No results for input(s): PROCALCITON, LATICACIDVEN in the last 168 hours.  Recent Results (from the past 240 hour(s))  Resp Panel by RT-PCR (Flu A&B, Covid) Nasopharyngeal Swab     Status: None   Collection Time: 10/14/20  7:06 PM   Specimen: Nasopharyngeal Swab; Nasopharyngeal(NP) swabs in vial transport medium  Result Value Ref Range Status   SARS Coronavirus 2 by RT PCR NEGATIVE NEGATIVE Final    Comment: (NOTE) SARS-CoV-2 target nucleic acids are NOT DETECTED.  The SARS-CoV-2 RNA is generally detectable in upper respiratory specimens during the acute phase of infection. The lowest concentration of SARS-CoV-2 viral copies this assay can detect is 138 copies/mL. A negative result  does not preclude SARS-Cov-2 infection and should not be used as the sole basis for treatment or other patient management decisions. A negative result may occur with  improper specimen collection/handling, submission of specimen other than nasopharyngeal swab, presence of viral mutation(s) within the areas targeted by this assay, and inadequate number of viral copies(<138 copies/mL). A negative result must be combined with clinical observations, patient history, and epidemiological information. The expected result is Negative.  Fact Sheet for Patients:  EntrepreneurPulse.com.au  Fact Sheet for Healthcare Providers:  IncredibleEmployment.be  This test is no t yet approved or cleared by the [place] FDA and  has been authorized for detection and/or diagnosis of SARS-CoV-2 by FDA under an Emergency Use Authorization (EUA). This EUA will remain  in effect (meaning this test can be used) for the duration of the COVID-19 declaration under Section 564(b)(1) of the Act, 21 U.S.C.section 360bbb-3(b)(1), unless the authorization is terminated  or revoked sooner.       Influenza A by PCR NEGATIVE NEGATIVE Final   Influenza B by PCR NEGATIVE NEGATIVE Final    Comment: (NOTE) The Xpert Xpress SARS-CoV-2/FLU/RSV plus assay is intended as an aid in the diagnosis of influenza from Nasopharyngeal swab specimens and should not be used as a sole basis for treatment. Nasal washings and aspirates are unacceptable for Xpert Xpress SARS-CoV-2/FLU/RSV testing.  Fact Sheet for Patients: EntrepreneurPulse.com.au  Fact Sheet for Healthcare Providers: IncredibleEmployment.be  This test is not yet approved or cleared by the [place] FDA and has been authorized for detection and/or diagnosis of SARS-CoV-2 by FDA under an Emergency Use Authorization (EUA). This EUA will remain in effect (meaning this test can be used) for  the duration of the COVID-19 declaration under Section 564(b)(1) of the Act, 21 U.S.C. section 360bbb-3(b)(1), unless the authorization is terminated or revoked.  Performed at [facility], [place] [address], [place], [place] [address]          Radiology Studies: CT HEAD WO CONTRAST (5MM)  Result Date: 10/19/2020 CLINICAL DATA:  Follow-up stroke with altered mental status, initial encounter EXAM: CT HEAD WITHOUT CONTRAST TECHNIQUE: Contiguous axial images were obtained from the base of the skull through the vertex without intravenous contrast. COMPARISON:  CT from earlier in the same day. FINDINGS: Brain: There are again changes of edema and decreased attenuation in the distribution of the right posterior middle cerebral artery consistent with prior infarct. There is again noted some effacement of the right lateral ventricle as well as mild midline shift from right to left of approximately 4  mm. No acute hemorrhage is seen. No new infarct is noted. Vascular: No hyperdense vessel or unexpected calcification. Skull: Normal. Negative for fracture or focal lesion. Sinuses/Orbits: No acute finding. Other: None. IMPRESSION: Evolving infarct involving the posterior aspect of the right middle cerebral artery. Persistent mass effect upon the right lateral ventricle is noted with mild midline shift. No new focal abnormality is seen. No herniation is seen. Electronically Signed   By: Inez Catalina M.D.   On: 10/19/2020 19:28        Scheduled Meds:  atorvastatin  40 mg Oral Daily   dabigatran  150 mg Oral BID   finasteride  5 mg Oral Q0600   hydrochlorothiazide  25 mg Oral Daily   levETIRAcetam  1,000 mg Oral BID   losartan  50 mg Oral Daily   psyllium  1 packet Oral Daily   Continuous Infusions:  sodium chloride 75 mL/hr at 10/21/20 0514     LOS: 7 days    Time spent: 25 mins.More than 50% of that time was spent in counseling and/or coordination of care.      Shelly Coss,  MD [facility] P8/18/2022, 8:23 AM

## 2020-10-21 NOTE — Progress Notes (Signed)
AD was given to nurse for patient and family to complete at a later time.  Nurse indicated that patient was currently experiencing some confusion.  Chaplain will follow as needed.  Jaclynn Major, [place], [facility], Pager [phone]

## 2020-10-21 NOTE — Patient Outreach (Signed)
[facility]) Care Management  10/21/2020  SUPREME CEFALO 01/08/1951 [id number]   Member readmitted to hospital on 8/11 with recurrent stroke, remains hospitalized.  Pending discharge to SNF.  Will close case at this time.  Valente David, [place], MSN [place] [phone]

## 2020-10-21 NOTE — Progress Notes (Signed)
Initial Nutrition Assessment  DOCUMENTATION CODES:  Not applicable  INTERVENTION:   Please note Cortrak service is only available Monday, Wednesday, and Friday. Tubes needing to be placed outside of those days will need to be done either at bedside or in IR.   Once Cortrak/small bore NGT is placed and ready for use: -Begin Osmolite 1.5 @ 65m/hr, advance by 164mhr Q4H until goal rate of 5591mr (1320m12m is reached -45ml34msource TF BID -175ml 59m water Q4H  At goal, TF provides 2060 kcals, 104 grams protein, 1005ml f75mwater (2055ml to13mfree water with flushes)  NUTRITION DIAGNOSIS:  Inadequate oral intake related to dysphagia as evidenced by NPO status.  GOAL:  Patient will meet greater than or equal to 90% of their needs  MONITOR:  TF tolerance, Weight trends, Labs, I & O's  REASON FOR ASSESSMENT:  Consult Enteral/tube feeding initiation and management  ASSESSMENT:  Pt with a PMH significant for CHF, HTN, HLD, PE, recent R MCA/PCA watershed ischemic stroke admitted with evolving/increasing size of stroke, petechial hemorrhages, cytotoxic edema, concern for new seizures.  Pt has developed severe dysphagia 2/2 recurrent stroke, SLP now recommending pt be NPO. Order placed for Cortrak. Please note Cortrak service is only available Monday, Wednesday, and Friday. Tubes needing to be placed outside of those days will need to be done either at bedside or in IR.   Limited weight history available for review.   PO Intake: 10-100% x last 8 recorded meals (~50% average meal intake)  UOP: 1.4L x24 hours I/O: -2.5L since admit  Labs and medications reviewed.   NUTRITION - FOCUSED PHYSICAL EXAM: Unable to perform at this time. Will attempt at follow-up.  Diet Order:   Diet Order             Diet NPO time specified  Diet effective now                  EDUCATION NEEDS:  No education needs have been identified at this time  Skin:  Skin Assessment: Reviewed RN  Assessment  Last BM:  8/16  Height:  Ht Readings from Last 1 Encounters:  10/14/20 '5\' 5"'$  (1.651 m)   Weight:  Wt Readings from Last 1 Encounters:  10/14/20 95.7 kg   Ideal Body Weight:  61.82 kg  BMI:  Body mass index is 35.11 kg/m.  Estimated Nutritional Needs:  Kcal:  2000-2200 Protein:  100-120 grams Fluid:  >2L    Clifford [name] ALarkin Ina, LDN (she/her/hers) RD pager number and weekend/on-call pager number located in [place]

## 2020-10-21 NOTE — Progress Notes (Signed)
OT Cancellation Note  Patient Details Name: BRANSEN CROPPER MRN: [id number] DOB: [birth year]   Cancelled Treatment:    Reason Eval/Treat Not Completed: Patient at procedure or test/ unavailable (Swallow study. Will return as schedule allows.)  [place], OTR/L Acute Rehab Pager: [phone] Office: [phone] 10/21/2020, 10:10 AM

## 2020-10-22 ENCOUNTER — Inpatient Hospital Stay (HOSPITAL_COMMUNITY): Payer: No Typology Code available for payment source

## 2020-10-22 DIAGNOSIS — R531 Weakness: Secondary | ICD-10-CM | POA: Diagnosis not present

## 2020-10-22 DIAGNOSIS — I639 Cerebral infarction, unspecified: Secondary | ICD-10-CM | POA: Diagnosis not present

## 2020-10-22 LAB — BASIC METABOLIC PANEL
Anion gap: 9 (ref 5–15)
BUN: 19 mg/dL (ref 8–23)
CO2: 23 mmol/L (ref 22–32)
Calcium: 8.9 mg/dL (ref 8.9–10.3)
Chloride: 105 mmol/L (ref 98–111)
Creatinine, Ser: 1.31 mg/dL — ABNORMAL HIGH (ref 0.61–1.24)
GFR, Estimated: 58 mL/min — ABNORMAL LOW (ref >60)
Glucose, Bld: 103 mg/dL — ABNORMAL HIGH (ref 70–99)
Potassium: 4.1 mmol/L (ref 3.5–5.1)
Sodium: 137 mmol/L (ref 135–145)

## 2020-10-22 LAB — GLUCOSE, CAPILLARY
Glucose-Capillary: 103 mg/dL — ABNORMAL HIGH (ref 70–99)
Glucose-Capillary: 109 mg/dL — ABNORMAL HIGH (ref 70–99)
Glucose-Capillary: 120 mg/dL — ABNORMAL HIGH (ref 70–99)
Glucose-Capillary: 126 mg/dL — ABNORMAL HIGH (ref 70–99)
Glucose-Capillary: 96 mg/dL (ref 70–99)
Glucose-Capillary: 97 mg/dL (ref 70–99)

## 2020-10-22 LAB — MAGNESIUM
Magnesium: 2.1 mg/dL (ref 1.7–2.4)
Magnesium: 2.2 mg/dL (ref 1.7–2.4)

## 2020-10-22 LAB — PHOSPHORUS
Phosphorus: 3.4 mg/dL (ref 2.5–4.6)
Phosphorus: 3 mg/dL (ref 2.5–4.6)

## 2020-10-22 MED ORDER — ORAL CARE MOUTH RINSE
15.0000 mL | Freq: Two times a day (BID) | OROMUCOSAL | Status: DC
  Administered 2020-10-22 – 2020-12-14 (×107): 15 mL via OROMUCOSAL

## 2020-10-22 MED ORDER — ENOXAPARIN SODIUM 100 MG/ML IJ SOSY
90.0000 mg | PREFILLED_SYRINGE | Freq: Two times a day (BID) | INTRAMUSCULAR | Status: DC
  Administered 2020-10-23 – 2020-10-28 (×10): 90 mg via SUBCUTANEOUS
  Filled 2020-10-22 (×12): qty 0.9

## 2020-10-22 MED ORDER — LEVETIRACETAM 100 MG/ML PO SOLN: 1000.0000 mg | Freq: Two times a day (BID) | ORAL | Status: DC

## 2020-10-22 MED ORDER — INSULIN ASPART 100 UNIT/ML IJ SOLN
0.0000 [IU] | Freq: Three times a day (TID) | INTRAMUSCULAR | Status: DC
  Administered 2020-10-23: 1 [IU] via SUBCUTANEOUS

## 2020-10-22 MED ORDER — LEVETIRACETAM 100 MG/ML PO SOLN
1000.0000 mg | Freq: Two times a day (BID) | ORAL | Status: DC
  Administered 2020-10-22 – 2020-10-27 (×10): 1000 mg
  Filled 2020-10-22 (×10): qty 10

## 2020-10-22 NOTE — Plan of Care (Signed)
  Problem: Activity: Goal: Risk for activity intolerance will decrease Outcome: Progressing   Problem: Nutrition: Goal: Adequate nutrition will be maintained Outcome: Progressing   Problem: Coping: Goal: Level of anxiety will decrease Outcome: Progressing   Problem: Pain Managment: Goal: General experience of comfort will improve Outcome: Progressing   Problem: Safety: Goal: Ability to remain free from injury will improve Outcome: Progressing   

## 2020-10-22 NOTE — Progress Notes (Signed)
Physical Therapy Treatment Patient Details Name: Clifford [name] MRN: [id number] DOB: [birth year] Today's Date: 10/22/2020    History of Present Illness 70 yo male presented to ED on 8/11 for R sided headache and worsening L UE weakness. Patient recently admitted 8/3-8/5 for R CVA with L visual deficits and L sided weakness. MRI with mild enlargement of R MCA/border zone infarct since 10/07/20 MRI and increased cytotoxic edema and petechial hemorrhage without midline shift. PMH: HTN, HLD, hx of PE on coumadin, CVA 10/2020    PT Comments    The pt presents with increased lethargy this session, able to arouse to eyes open only for a few moments throughout entire session. The pt was noted to have soiled his bed, and therefore was assisted in completing multiple rolls in bed to allow for cleaning. The pt demos some attempts to grip with his LUE with max cues, but otherwise required max-totalA of 2 to complete all movements and repositioning. No change in alertness with changes in position. Further OOB mobility deferred due to increased lethargy and pt inability to participate at this time. Will continue efforts to progress activity tolerance and pt engagement. Continue to recommend SNF given pt current deficits and level of assist needed for any mobility.     Follow Up Recommendations  SNF;Supervision/Assistance - 24 hour     Equipment Recommendations  Other (comment) (defer to post acute)    Recommendations for Other Services       Precautions / Restrictions Precautions Precautions: Fall Precaution Comments: L visual field deficit, L inattention Restrictions Weight Bearing Restrictions: No    Mobility  Bed Mobility Overal bed mobility: Needs Assistance Bed Mobility: Rolling Rolling: Total assist         General bed mobility comments: Total A for rolling in bed    Transfers                 General transfer comment: Defer due to lethargy and arousal  Modified Rankin (Stroke  Patients Only) Modified Rankin (Stroke Patients Only) Pre-Morbid Rankin Score: Moderately severe disability Modified Rankin: Severe disability     Balance Overall balance assessment: Needs assistance   Sitting balance-Leahy Scale: Zero Sitting balance - Comments: was unable to progress to seated position without full trunk support due to lethargy and low level of arousal                                    Cognition Arousal/Alertness: Lethargic Behavior During Therapy: Flat affect Overall Cognitive Status: Difficult to assess                                 General Comments: Pt very lethargic and keeping his eyes closed      Exercises      General Comments General comments (skin integrity, edema, etc.): daughters present at start of session      Pertinent Vitals/Pain Pain Assessment: Faces Pain Score: 0-No pain Faces Pain Scale: No hurt Pain Intervention(s): Monitored during session     PT Goals (current goals can now be found in the care plan section) Acute Rehab PT Goals Patient Stated Goal: none stated, pt with minimal verbalization PT Goal Formulation: With patient Time For Goal Achievement: 10/29/20 Potential to Achieve Goals: Fair Progress towards PT goals: Not progressing toward goals - comment (continued lethargy)    Frequency  Min 3X/week      PT Plan Current plan remains appropriate    Co-evaluation PT/OT/SLP Co-Evaluation/Treatment: Yes Reason for Co-Treatment: For patient/therapist safety;To address functional/ADL transfers PT goals addressed during session: Mobility/safety with mobility OT goals addressed during session: ADL's and self-care      AM-PAC PT "6 Clicks" Mobility   Outcome Measure  Help needed turning from your back to your side while in a flat bed without using bedrails?: Total Help needed moving from lying on your back to sitting on the side of a flat bed without using bedrails?: Total Help  needed moving to and from a bed to a chair (including a wheelchair)?: Total Help needed standing up from a chair using your arms (e.g., wheelchair or bedside chair)?: Total Help needed to walk in hospital room?: Total Help needed climbing 3-5 steps with a railing? : Total 6 Click Score: 6    End of Session Equipment Utilized During Treatment: Gait belt Activity Tolerance: Patient limited by lethargy Patient left: in bed;with call [place]/phone within reach;with family/visitor present Nurse Communication: Mobility status (pt on bed pan) PT Visit Diagnosis: Unsteadiness on feet (R26.81);Muscle weakness (generalized) (M62.81);Other symptoms and signs involving the nervous system (R29.898);Other abnormalities of gait and mobility (R26.89)     Time: [id number] PT Time Calculation (min) (ACUTE ONLY): 31 min  Charges:  $Therapeutic Activity: 8-22 mins                     Inocencio Homes, PT, DPT   Acute Rehabilitation Department Pager #: [phone]   Sandra Cockayne 10/22/2020, 6:28 PM

## 2020-10-22 NOTE — Progress Notes (Signed)
  Speech Language Pathology Treatment: Dysphagia;Cognitive-Linquistic  Patient Details Name: Clifford [name] MRN: [id number] DOB: 03-22-1950 Today's Date: 10/22/2020 Time: [id number] SLP Time Calculation (min) (ACUTE ONLY): 21 min  Assessment / Plan / Recommendation Clinical Impression  Pt seen for skilled treatment with PO trials/cognition with pt being able to follow simple 1-2 step directives given min-mod verbal/tactile cues during PO trials for functional tasks (ie: stick out your tongue, open your mouth). Pt oriented x4 and answering personal information questions during session with 80% accuracy.  Pt's alertness level impacting ability to participate for extended session.  Cortrak placed for nutrition/hydration purposes and pt/family educated on goals for swallowing/speech/cognition.  Attention continues to wax/wane during sessions with min-max multimodal cues required depending on pt's level of alertness.  Pt given ice chips after extensive oral care provided with oral holding, decreased oral manipulation and oral suctioning provided d/t increased salivary production.  Coughing noted during oral care and after intake of ice chips.  No L  labial loss noted, although limited PO intake observed this session.  Family member's questions addressed re: goals for tx and safety with swallowing given NPO status.  ST will continue to follow acutely for above mentioned goals.   HPI HPI: 70yo male admitted 10/14/20 with right side headache and LUE weakness. 10/19/20 pt was noted to have increased LUE weakness. 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 CTHead = evolving RMCA infarct with cytotoxic edema, mass effect      SLP Plan  Continue with current plan of care       Recommendations  Diet recommendations: NPO Medication Administration: Via alternative means                Oral Care Recommendations: Oral care QID Follow up  Recommendations: 24 hour supervision/assistance;Other (comment) (TBD) SLP Visit Diagnosis: Dysphagia, oropharyngeal phase (R13.12) Plan: Continue with current plan of care                       Elvina Sidle, M.S., CCC-SLP 10/22/2020, 12:39 PM

## 2020-10-22 NOTE — Progress Notes (Signed)
PROGRESS NOTE    Clifford [name]  [id number] DOB: [birth year] DOA: 10/14/2020 PCP: Pcp, No   Chief Complain:AMS  Brief Narrative:  Patient is a 70 year old male with history of diastolic congestive heart failure, hypertension, hyperlipidemia, PE on anticoagulation, recent right MCA/PCA watershed ischemic stroke who presented with increasing left upper arm weakness, left visual defect.  MRI done on presentation showed evolving/increased size of stroke, petechial hemorrhages, cytotoxic edema, concern for new seizures.  Neurology was consulted and were following.  In the early morning of 10/19/2020, patient was noticed to have increased left upper extremity weakness.  CT head follow-up showed evolving right posterior MCA territory infarct with increased cytotoxic edema, mass-effect.  Neurology re-consulted.Eventual plan is to discharge to SnF as per PT/OT  10/22/2020: Patient seen alongside 2 daughters.  No new changes.  Patient is awaiting disposition.  Assessment & Plan:   Principal Problem:   Left-sided weakness Active Problems:   Stroke [facility])   Essential hypertension   Mixed hyperlipidemia   Prediabetes   History of pulmonary embolism   Chronic anticoagulation   Nicotine dependence, cigarettes, uncomplicated   Petechial hemorrhage   Acute stroke due to ischemia [facility])   Left-sided weakness: Secondary to worsening right parietal occipital MCA/PCA watershed stroke complicated by worsening petechial hemorrhage.  MRI of the brain reviewed.  Pradaxa was held now restarted.  Repeat CT head on 8/12 did not show any apparent change.  Neurology were following.  His left-sided weakness was improving.  In the early morning of 10/19/2020, patient was noticed to have increased left upper extremity weakness.  CT head follow-up showed evolving right posterior MCA territory infarct with increased cytotoxic edema, mass-effect.  Neurology re-consulted.  Now he has dense hemiplegia on the left  side Eventual plan is to discharge to SNF as per PT/OT   Concern for seizure: Likely precipitated in the setting of worsening stroke with petechial hemorrhage.  EEG showed cortical dysfunction , no epileptiform discharges.  Currently on Keppra.  Hypertension: Continue monitoring blood pressure.   On hydrochlorothiazide and losartan at home.  Resumed  AKI: Most likely secondary to poor oral intake.  Will start on gentle IV fluids.  Sinus bradycardia: Asymptomatic.  Continue to monitor.  Hyperlipidemia: On Lipitor.  Recent LDL of 75.  Prediabetes: Monitor blood sugars.  Carbohydrate consistent diet.  History of multiple pulmonary embolism: On lifelong anticoagulation.  Takes Pradaxa at home which was held due to finding of petechial hemorrhage,now restarted  Tobacco abuse: Counseled cessation.  Declined nicotine patch  Morbid obesity: BMI of 35.1.  We recommend weight loss, healthy lifestyle, PCP follow-up    Nutrition Problem: Inadequate oral intake Etiology: dysphagia      DVT prophylaxis:Pradaxa Code Status: Full Family Communication: Discussed with daughter and brother at bedside on 10/19/2020  Remains inpatient appropriate because:Unsafe d/c plan  Dispo: The patient is from: Home              Anticipated d/c is to: SNF              Patient currently is medically stable for dc   Difficult to place patient No     Consultants: neurology  Procedures:EEG  Antimicrobials:  Anti-infectives (From admission, onward)    None       Subjective: No history from patient.  Patient is resting quietly.  Objective: Vitals:   10/22/20 0328 10/22/20 0500 10/22/20 0734 10/22/20 1702  BP: 140/66  (!) 176/75 (!) 151/71  Pulse: 82  (!) 54 (!) 56  Resp: '20  20 18  '$ Temp: 98.4 F (36.9 C)  98.4 F (36.9 C) 98.3 F (36.8 C)  TempSrc:   Oral Oral  SpO2: 100%  97% 100%  Weight:  93 kg    Height:        Intake/Output Summary (Last 24 hours) at 10/22/2020 1705 Last data  filed at 10/22/2020 1200 Gross per 24 hour  Intake 1656.57 ml  Output 975 ml  Net 681.57 ml    Filed Weights   10/14/20 0840 10/22/20 0500  Weight: 95.7 kg 93 kg    Examination:  General exam: Resting quietly. HEENT: PERRL Respiratory system:  no wheezes or crackles  Cardiovascular system: S1 & S2 heard, RRR.  Gastrointestinal system: Abdomen is nondistended, soft and nontender. Central nervous system: Not alert or oriented today.  Left  densehemiparesis Extremities: No edema, no clubbing ,no cyanosis  Data Reviewed: I have personally reviewed following labs and imaging studies  CBC: Recent Labs  Lab 10/16/20 0346  WBC 4.4  HGB 11.8*  HCT 36.7*  MCV 81.9  PLT [id number]    Basic Metabolic Panel: Recent Labs  Lab 10/16/20 0346 10/20/20 0142 10/21/20 0300 10/22/20 0509  NA 134* 135 136 137  K 4.1 3.8 4.1 4.1  CL 104 103 101 105  CO2 '24 24 23 23  '$ GLUCOSE 104* 118* 113* 103*  BUN '15 18 19 19  '$ CREATININE 1.25* 1.40* 1.43* 1.31*  CALCIUM 8.8* 8.8* 9.2 8.9  MG  --   --   --  2.1  PHOS  --   --   --  3.4    GFR: Estimated Creatinine Clearance: 54.2 mL/min (A) (by C-G formula based on SCr of 1.31 mg/dL (H)). Liver Function Tests: No results for input(s): AST, ALT, ALKPHOS, BILITOT, PROT, ALBUMIN in the last 168 hours.  No results for input(s): LIPASE, AMYLASE in the last 168 hours. No results for input(s): AMMONIA in the last 168 hours. Coagulation Profile: No results for input(s): INR, PROTIME in the last 168 hours.  Cardiac Enzymes: No results for input(s): CKTOTAL, CKMB, CKMBINDEX, TROPONINI in the last 168 hours. BNP (last 3 results) No results for input(s): PROBNP in the last 8760 hours. HbA1C: No results for input(s): HGBA1C in the last 72 hours. CBG: Recent Labs  Lab 10/22/20 0021 10/22/20 0408 10/22/20 0846 10/22/20 1248 10/22/20 1636  GLUCAP 126* 103* 97 96 109*   Lipid Profile: No results for input(s): CHOL, HDL, LDLCALC, TRIG, CHOLHDL,  LDLDIRECT in the last 72 hours. Thyroid Function Tests: No results for input(s): TSH, T4TOTAL, FREET4, T3FREE, THYROIDAB in the last 72 hours. Anemia Panel: No results for input(s): VITAMINB12, FOLATE, FERRITIN, TIBC, IRON, RETICCTPCT in the last 72 hours. Sepsis Labs: No results for input(s): PROCALCITON, LATICACIDVEN in the last 168 hours.  Recent Results (from the past 240 hour(s))  Resp Panel by RT-PCR (Flu A&B, Covid) Nasopharyngeal Swab     Status: None   Collection Time: 10/14/20  7:06 PM   Specimen: Nasopharyngeal Swab; Nasopharyngeal(NP) swabs in vial transport medium  Result Value Ref Range Status   SARS Coronavirus 2 by RT PCR NEGATIVE NEGATIVE Final    Comment: (NOTE) SARS-CoV-2 target nucleic acids are NOT DETECTED.  The SARS-CoV-2 RNA is generally detectable in upper respiratory specimens during the acute phase of infection. The lowest concentration of SARS-CoV-2 viral copies this assay can detect is 138 copies/mL. A negative result does not preclude SARS-Cov-2 infection and should not be used as the sole basis for treatment  or other patient management decisions. A negative result may occur with  improper specimen collection/handling, submission of specimen other than nasopharyngeal swab, presence of viral mutation(s) within the areas targeted by this assay, and inadequate number of viral copies(<138 copies/mL). A negative result must be combined with clinical observations, patient history, and epidemiological information. The expected result is Negative.  Fact Sheet for Patients:  EntrepreneurPulse.com.au  Fact Sheet for Healthcare Providers:  IncredibleEmployment.be  This test is no t yet approved or cleared by the [place] FDA and  has been authorized for detection and/or diagnosis of SARS-CoV-2 by FDA under an Emergency Use Authorization (EUA). This EUA will remain  in effect (meaning this test can be used) for the  duration of the COVID-19 declaration under Section 564(b)(1) of the Act, 21 U.S.C.section 360bbb-3(b)(1), unless the authorization is terminated  or revoked sooner.       Influenza A by PCR NEGATIVE NEGATIVE Final   Influenza B by PCR NEGATIVE NEGATIVE Final    Comment: (NOTE) The Xpert Xpress SARS-CoV-2/FLU/RSV plus assay is intended as an aid in the diagnosis of influenza from Nasopharyngeal swab specimens and should not be used as a sole basis for treatment. Nasal washings and aspirates are unacceptable for Xpert Xpress SARS-CoV-2/FLU/RSV testing.  Fact Sheet for Patients: EntrepreneurPulse.com.au  Fact Sheet for Healthcare Providers: IncredibleEmployment.be  This test is not yet approved or cleared by the [place] FDA and has been authorized for detection and/or diagnosis of SARS-CoV-2 by FDA under an Emergency Use Authorization (EUA). This EUA will remain in effect (meaning this test can be used) for the duration of the COVID-19 declaration under Section 564(b)(1) of the Act, 21 U.S.C. section 360bbb-3(b)(1), unless the authorization is terminated or revoked.  Performed at [facility], [place] [address], [place], [place] [address]           Radiology Studies: DG Abd Portable 1V  Result Date: 10/22/2020 CLINICAL DATA:  Encounter for NG tube EXAM: PORTABLE ABDOMEN - 1 VIEW COMPARISON:  None. FINDINGS: There is a feeding tube with tip overlying the distal stomach. There are some prominent loops of small bowel but there is no evidence of obstruction. Multilevel degenerative changes of the spine. IMPRESSION: Feeding tube tip overlies the region of the distal stomach. Electronically Signed   By: Maurine Simmering M.D.   On: 10/22/2020 13:00   DG Swallowing Func-Speech Pathology  Result Date: 10/21/2020 Table formatting from the original result was not included. Objective Swallowing Evaluation: Type of Study: MBS-Modified Barium  Swallow Study  Patient Details Name: Clifford [name] MRN: [id number] Date of Birth: 14-Jan-1951 Today's Date: 10/21/2020 Time: SLP Start Time (ACUTE ONLY): 0900 -SLP Stop Time (ACUTE ONLY): 0910 SLP Time Calculation (min) (ACUTE ONLY): 10 min Past Medical History: Past Medical History: Diagnosis Date  BPH (benign prostatic hyperplasia)   Chronic anticoagulation 10/14/2020  Essential hypertension 10/06/2020  History of pulmonary embolism 10/14/2020  HLD (hyperlipidemia)   HTN (hypertension)   Lung nodule   81m in 2015  Malignant neoplasm of left kidney ([facility]   Mixed hyperlipidemia 10/06/2020  Nicotine dependence, cigarettes, uncomplicated [id number] Peripheral vascular disease ([place]   Polyp of colon   Prediabetes 10/14/2020  Pulmonary embolus ([place]   Stroke ([place] 10/06/2020 Past Surgical History: Past Surgical History: Procedure Laterality Date  COLONOSCOPY  03/2017  PARTIAL NEPHRECTOMY Left 02/2018  PROSTATE BIOPSY  2015 HPI: [id number] admitted 10/14/20 with right side headache and LUE weakness. 10/19/20 pt was noted to have increased LUE  weakness. 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 CTHead = evolving RMCA infarct with cytotoxic edema, mass effect  Subjective: alert, requires cueing Assessment / Plan / Recommendation CHL IP CLINICAL IMPRESSIONS 10/21/2020 Clinical Impression Pt unfortunately with very limited MBSS. Pt with eyes open during exam, responding some to simple commands but exhibited significant cognitive deficits impairing ability for swallow sequencing. Orally pt with persistent left sided oral motor deficits post CVA that contributed to left anterior spillage, oral holding of bolus, poor bolus cohesion, with minimal to no bolus manipulation. Despite multimodal cues pt was unable to propel any of tested consistencies to pharynx. Contents spilled from oral cavity and remainder was cleaned by SLP with oral care post exam. Attempted nectar thick  via tsp and cup, and puree textures. Per chart review, this appears consistent with documented concern from nursing yesterday evening. Recommend NPO with consideration of short term means of alternative nutrition, continued SLP intervention with hopes of PO diet reinitiation as pt clinically tolerating. SLP notified MD and RN of recommendations and will continue to closely follow. SLP Visit Diagnosis Dysphagia, oral phase (R13.11) Attention and concentration deficit following -- Frontal lobe and executive function deficit following -- Impact on safety and function Moderate aspiration risk;Severe aspiration risk;Risk for inadequate nutrition/hydration   CHL IP TREATMENT RECOMMENDATION 10/21/2020 Treatment Recommendations Therapy as outlined in treatment plan below   Prognosis 10/21/2020 Prognosis for Safe Diet Advancement Fair Barriers to Reach Goals Cognitive deficits;Severity of deficits Barriers/Prognosis Comment -- CHL IP DIET RECOMMENDATION 10/21/2020 SLP Diet Recommendations NPO;Alternative means - temporary Liquid Administration via -- Medication Administration Via alternative means Compensations -- Postural Changes --   CHL IP OTHER RECOMMENDATIONS 10/21/2020 Recommended Consults -- Oral Care Recommendations Oral care QID Other Recommendations --   CHL IP FOLLOW UP RECOMMENDATIONS 10/21/2020 Follow up Recommendations 24 hour supervision/assistance;Skilled Nursing facility   [facility] IP FREQUENCY AND DURATION 10/21/2020 Speech Therapy Frequency (ACUTE ONLY) min 3x week Treatment Duration 2 weeks      CHL IP ORAL PHASE 10/21/2020 Oral Phase Impaired Oral - Pudding Teaspoon -- Oral - Pudding Cup -- Oral - Honey Teaspoon -- Oral - Honey Cup -- Oral - Nectar Teaspoon Left anterior bolus loss;Weak lingual manipulation;Reduced posterior propulsion;Holding of bolus;Pocketing in anterior sulcus;Lingual/palatal residue;Delayed oral transit;Decreased bolus cohesion Oral - Nectar Cup Left anterior bolus loss;Weak lingual  manipulation;Reduced posterior propulsion;Holding of bolus;Left pocketing in lateral sulci;Pocketing in anterior sulcus;Lingual/palatal residue;Delayed oral transit Oral - Nectar Straw -- Oral - Thin Teaspoon -- Oral - Thin Cup -- Oral - Thin Straw -- Oral - Puree Reduced posterior propulsion;Holding of bolus;Left pocketing in lateral sulci;Lingual/palatal residue;Delayed oral transit;Decreased bolus cohesion Oral - Mech Soft -- Oral - Regular -- Oral - Multi-Consistency -- Oral - Pill -- Oral Phase - Comment --  CHL IP PHARYNGEAL PHASE 10/21/2020 Pharyngeal Phase Impaired Pharyngeal- Pudding Teaspoon -- Pharyngeal -- Pharyngeal- Pudding Cup -- Pharyngeal -- Pharyngeal- Honey Teaspoon -- Pharyngeal -- Pharyngeal- Honey Cup -- Pharyngeal -- Pharyngeal- Nectar Teaspoon Other (Comment) Pharyngeal -- Pharyngeal- Nectar Cup Other (Comment) Pharyngeal -- Pharyngeal- Nectar Straw -- Pharyngeal -- Pharyngeal- Thin Teaspoon -- Pharyngeal -- Pharyngeal- Thin Cup -- Pharyngeal -- Pharyngeal- Thin Straw -- Pharyngeal -- Pharyngeal- Puree Other (Comment) Pharyngeal -- Pharyngeal- Mechanical Soft -- Pharyngeal -- Pharyngeal- Regular -- Pharyngeal -- Pharyngeal- Multi-consistency -- Pharyngeal -- Pharyngeal- Pill -- Pharyngeal -- Pharyngeal Comment --  No flowsheet data found. [place], CCC-SLP 10/21/2020, 9:35 AM  Scheduled Meds:  amLODipine  10 mg Oral Daily   atorvastatin  40 mg Oral Daily   dabigatran  150 mg Oral BID   feeding supplement (PROSource TF)  45 mL Per Tube BID   finasteride  5 mg Oral Q0600   free water  175 mL Per Tube Q4H   levETIRAcetam  1,000 mg Oral BID   mouth rinse  15 mL Mouth Rinse BID   psyllium  1 packet Oral Daily   Continuous Infusions:  feeding supplement (OSMOLITE 1.5 CAL) 1,000 mL (10/22/20 1332)     LOS: 8 days    Time spent: 25 mins.More than 50% of that time was spent in counseling and/or coordination of care.      Bonnell Public,  MD [facility] P8/19/2022, 5:05 PM

## 2020-10-22 NOTE — Procedures (Signed)
Cortrak  Person Inserting Tube:  Maylon Peppers C, RD Tube Type:  Cortrak - 43 inches Tube Size:  10 Tube Location:  Left nare Initial Placement:  Stomach Secured by: Bridle Technique Used to Measure Tube Placement:  Marking at nare/corner of mouth Cortrak Secured At:  68 cm  Cortrak Tube Team Note:  Consult received to place a Cortrak feeding tube.   X-ray is required, abdominal x-ray has been ordered by the Cortrak team. Please confirm tube placement before using the Cortrak tube.   If the tube becomes dislodged please keep the tube and contact the Cortrak team at [email or web address] ([email or web address]) for replacement.  If after hours and replacement cannot be delayed, place a NG tube and confirm placement with an abdominal x-ray.    Lockie Pares., RD, LDN, CNSC See AMiON for contact information

## 2020-10-22 NOTE — Progress Notes (Signed)
Occupational Therapy Treatment Patient Details Name: Clifford [name] MRN: [id number] DOB: 04-06-1950 Today's Date: 10/22/2020    History of present illness 70 yo male presented to ED on 8/11 for R sided headache and worsening L UE weakness. Patient recently admitted 8/3-8/5 for R CVA with L visual deficits and L sided weakness. MRI with mild enlargement of R MCA/border zone infarct since 10/07/20 MRI and increased cytotoxic edema and petechial hemorrhage without midline shift. PMH: HTN, HLD, hx of PE on coumadin, CVA 10/2020   OT comments  Pt presenting with decreased arousal and engagement. Opening eyes with upright posture. Pt with bowel and bladder incontinence in bed. Pt requiring Total A for rolling in bed and peri care. Due to poor arousal, deferred OOB activity for safety. Update dc to SNF and will continue to follow acutely as admitted.    Follow Up Recommendations  SNF    Equipment Recommendations  None recommended by OT    Recommendations for Other Services PT consult    Precautions / Restrictions Precautions Precautions: Fall Precaution Comments: L visual field deficit, L inattention       Mobility Bed Mobility Overal bed mobility: Needs Assistance Bed Mobility: Rolling Rolling: Total assist         General bed mobility comments: Total A for rolling in bed    Transfers                 General transfer comment: Defer due to lethargy and arousal    Balance                                           ADL either performed or assessed with clinical judgement   ADL Overall ADL's : Needs assistance/impaired                                       General ADL Comments: Pt requiring Total A for rolling in bed and peri care.     Vision   Additional Comments: Pt keep eyes closed   Perception     Praxis      Cognition Arousal/Alertness: Lethargic Behavior During Therapy: Flat affect Overall Cognitive Status: Difficult to  assess                                 General Comments: Pt very lethargic and keeping his eyes closed        Exercises     Shoulder Instructions       General Comments Daughters present at begining of session    Pertinent Vitals/ Pain       Pain Assessment: Faces Faces Pain Scale: No hurt Pain Intervention(s): Monitored during session  Home Living                                          Prior Functioning/Environment              Frequency  Min 2X/week        Progress Toward Goals  OT Goals(current goals can now be found in the care plan section)  Progress towards OT goals: Not progressing toward goals -  comment  Acute Rehab OT Goals Patient Stated Goal: none stated, pt with minimal verbalization OT Goal Formulation: With patient Time For Goal Achievement: 10/29/20 Potential to Achieve Goals: Good ADL Goals Pt Will Perform Grooming: with supervision;standing Pt Will Perform Lower Body Bathing: Independently Pt Will Perform Lower Body Dressing: with max assist;sit to/from stand Pt Will Transfer to Toilet: with supervision;ambulating;regular height toilet Pt Will Perform Toileting - Clothing Manipulation and hygiene: with supervision;sit to/from stand;sitting/lateral leans Additional ADL Goal #1: Pt will locate three grooming items in left visual field with Min cues Additional ADL Goal #2: Pt will demonstrate emergent awareness during ADLs with Min cues  Plan Discharge plan needs to be updated    Co-evaluation    PT/OT/SLP Co-Evaluation/Treatment: Yes Reason for Co-Treatment: For patient/therapist safety;To address functional/ADL transfers   OT goals addressed during session: ADL's and self-care      AM-PAC OT "6 Clicks" Daily Activity     Outcome Measure   Help from another person eating meals?: Total Help from another person taking care of personal grooming?: Total Help from another person toileting, which  includes using toliet, bedpan, or urinal?: Total Help from another person bathing (including washing, rinsing, drying)?: Total Help from another person to put on and taking off regular upper body clothing?: Total Help from another person to put on and taking off regular lower body clothing?: Total 6 Click Score: 6    End of Session    OT Visit Diagnosis: Unsteadiness on feet (R26.81)   Activity Tolerance Patient limited by lethargy   Patient Left in bed;with call [place]/phone within reach;with bed alarm set   Nurse Communication Other (comment) (Pt on bedpan)        [id number] OT Time Calculation (min): 31 min  Charges: OT General Charges $OT Visit: 1 Visit OT Treatments $Self Care/Home Management : 8-22 mins  [place], OTR/L Acute Rehab Pager: [phone] Office: [place] 10/22/2020, 5:29 PM

## 2020-10-22 NOTE — Progress Notes (Signed)
[place] for switch Pradaxa to Lovenox since unable to crush Pradaxa (allergy to Eliquis) Indication: hx pulmonary embolus  Allergies  Allergen Reactions   Apixaban     Other reaction(s): Liver enzymes abnormal   Lisinopril     Other reaction(s): Cough (finding)   Simvastatin     Other reaction(s): Pain in lower limb (finding)    Patient Measurements: Height: '5\' 5"'$  (165.1 cm) Weight: 93 kg (205 lb 0.4 oz) IBW/kg (Calculated) : 61.5  Vital Signs: Temp: 99.1 F (37.3 C) (08/19 1921) Temp Source: Oral (08/19 1921) BP: 163/71 (08/19 1921) Pulse Rate: 81 (08/19 1921)  Labs: Recent Labs    10/20/20 0142 10/21/20 0300 10/22/20 0509  CREATININE 1.40* 1.43* 1.31*    Estimated Creatinine Clearance: 54.2 mL/min (A) (by C-G formula based on SCr of 1.31 mg/dL (H)).   Medical History: Past Medical History:  Diagnosis Date   BPH (benign prostatic hyperplasia)    Chronic anticoagulation 10/14/2020   Essential hypertension 10/06/2020   History of pulmonary embolism 10/14/2020   HLD (hyperlipidemia)    HTN (hypertension)    Lung nodule    62m in 2015   Malignant [place] of left kidney ([place]    Mixed hyperlipidemia 10/06/2020   Nicotine dependence, cigarettes, uncomplicated [id number]  Peripheral vascular disease ([place]    Polyp of colon    Prediabetes 10/14/2020   Pulmonary embolus ([place]    Stroke ([place] 10/06/2020    Assessment: 70yo male on chronic Pradaxa for hx PE's, now unable to swallow Pradaxa whole and it cannot be crushed.  Pharmacy asked to dose Lovenox in the interim.  Last dose of Pradaxa today at 1331 PM.  Goal of Therapy:  Monitor platelets by anticoagulation protocol: Yes   Plan:  Lovenox 90 mg SQ q 12 hrs - 1st dose at 2 AM 8/20. F/u ability to swallow and switch back to Pradaxa.  Xarelto another option, but I think Pradaxa is preferred agent by [facility].  J[place]evada Crane PRoylene Reason BCCP Clinical Pharmacist   10/22/2020 10:02 PM   [facility] Center[facility] phone numbers are listed on [email or web address]

## 2020-10-22 NOTE — Progress Notes (Signed)
[facility] night shift PCU coverage note.  The nursing staff stated they are unable to crush the Pradaxa tablet and give by tube.  The patient has a history of chronic PE and is allergic to apixaban so pharmacy was consulted to dose Lovenox instead.  Keppra was also switch to liquid form.  Tennis Must, MD.

## 2020-10-22 NOTE — Progress Notes (Signed)
Inpatient Rehab Admissions Coordinator:   Therapy is recommending SNF. I do not believe she can tolerate the intensity of CIR so I will sign off.   Clemens Catholic, [place], [place] Admissions Coordinator  [phone] ([place]) [phone] (office)

## 2020-10-22 NOTE — Progress Notes (Signed)
Patient ID: Clifford [name], male   DOB: 22-Jun-1950, 70 y.o.   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  He follows commands well.  He continues to have dense left hemiplegia but purposeful movements on the right side against gravity.   .  Neurological exam is unchanged.  His daughter and multiple family members are at the bedside.  Vital signs are stable OBJECTIVE Temp:  [98.3 F (36.8 C)-98.4 F (36.9 C)] 98.3 F (36.8 C) (08/19 1702) Pulse Rate:  [52-82] 56 (08/19 1702) Cardiac Rhythm: Normal sinus rhythm (08/19 1200) Resp:  [18-20] 18 (08/19 1702) BP: (140-176)/(66-86) 151/71 (08/19 1702) SpO2:  [97 %-100 %] 100 % (08/19 1702) Weight:  [93 kg] 93 kg (08/19 0500)  Recent Labs  Lab 10/22/20 0021 10/22/20 0408 10/22/20 0846 10/22/20 1248 10/22/20 1636  GLUCAP 126* 103* 97 96 109*   Recent Labs  Lab 10/16/20 0346 10/20/20 0142 10/21/20 0300 10/22/20 0509  NA 134* 135 136 137  K 4.1 3.8 4.1 4.1  CL 104 103 101 105  CO2 '24 24 23 23  '$ GLUCOSE 104* 118* 113* 103*  BUN '15 18 19 19  '$ CREATININE 1.25* 1.40* 1.43* 1.31*  CALCIUM 8.8* 8.8* 9.2 8.9  MG  --   --   --  2.1  PHOS  --   --   --  3.4   No results for input(s): AST, ALT, ALKPHOS, BILITOT, PROT, ALBUMIN in the last 168 hours.  Recent Labs  Lab 10/16/20 0346  WBC 4.4  HGB 11.8*  HCT 36.7*  MCV 81.9  PLT 169   No results for input(s): CKTOTAL, CKMB, CKMBINDEX, TROPONINI in the last 168 hours. No results for input(s): LABPROT, INR in the last 72 hours.  No results for input(s): COLORURINE, LABSPEC, [place], GLUCOSEU, HGBUR, BILIRUBINUR, KETONESUR, PROTEINUR, UROBILINOGEN, NITRITE, LEUKOCYTESUR in the last 72 hours.  Invalid input(s): APPERANCEUR     Component Value Date/Time   CHOL 128 10/07/2020 0450   TRIG 63 10/07/2020 0450   HDL 40 (L) 10/07/2020 0450   CHOLHDL 3.2 10/07/2020 0450   VLDL 13 10/07/2020  0450   LDLCALC 75 10/07/2020 0450   Lab Results  Component Value Date   HGBA1C 5.9 (H) 10/07/2020   No results found for: LABOPIA, COCAINSCRNUR, LABBENZ, AMPHETMU, THCU, LABBARB  No results for input(s): ETH in the last 168 hours.  I have personally reviewed the radiological images below and agree with the radiology interpretations.  CT Angio Head W or Wo Contrast  Result Date: 10/06/2020 CLINICAL DATA:  Stroke. TIA. Assess intracranial arteries. Unsteady gait. Left-sided weakness. Left-sided visual loss. Headache. EXAM: CT ANGIOGRAPHY HEAD AND NECK TECHNIQUE: Multidetector CT imaging of the head and neck was performed using the standard protocol during bolus administration of intravenous contrast. Multiplanar CT image reconstructions and MIPs were obtained to evaluate the vascular anatomy. Carotid stenosis measurements (when applicable) are obtained utilizing NASCET criteria, using the distal internal carotid diameter as the denominator. CONTRAST:  167m OMNIPAQUE IOHEXOL 350 MG/ML SOLN COMPARISON:  Head CT earlier same day FINDINGS: CTA NECK FINDINGS Aortic arch: Aortic atherosclerosis.  Branching pattern is normal. Right carotid system: Common carotid artery is tortuous but widely patent to the bifurcation. Carotid bifurcation is widely patent. Minimal calcified plaque at the distal ICA bulb but no stenosis. Cervical ICA widely patent. Left carotid system: Common carotid artery is tortuous but widely patent to the bifurcation. Carotid  bifurcation is normal. Minimal calcified plaque at the ICA bulb but no stenosis. Cervical ICA widely patent beyond that. Vertebral arteries: Right vertebral artery origin is widely patent. Left vertebral artery origin is poorly seen because of regional venous reflux. Both vertebral arteries do appear patent through the cervical region to the foramen magnum. Skeleton: Ordinary cervical spondylosis. Other neck: No mass or lymphadenopathy. Upper chest: Upper lungs are  clear. Review of the MIP images confirms the above findings CTA HEAD FINDINGS Anterior circulation: Both internal carotid arteries are patent through the skull base and siphon regions. There is ordinary siphon atherosclerotic calcification. On the left, there is no stenosis. On the right, there is 50-70% stenosis in the siphon. Supraclinoid internal carotid arteries are widely patent. The anterior and middle cerebral vessels are patent. No large vessel occlusion, with particular attention to the right MCA. Posterior circulation: Both vertebral arteries are patent through the foramen magnum to the basilar. No basilar stenosis. Posterior circulation branch vessels are normal. Venous sinuses: Patent and normal. Anatomic variants: None significant. Review of the MIP images confirms the above findings IMPRESSION: No intracranial large vessel occlusion identified at this time, with specific attention to the right MCA branches. Minimal atherosclerotic change at both internal carotid artery bulbs. No stenosis. 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 No stenosis on the left. 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 Electronically Signed   By: Nelson Chimes M.D.   On: 10/06/2020 15:01   CT HEAD WO CONTRAST (5MM)  Result Date: 10/19/2020 CLINICAL DATA:  Follow-up stroke with altered mental status, initial encounter EXAM: CT HEAD WITHOUT CONTRAST TECHNIQUE: Contiguous axial images were obtained from the base of the skull through the vertex without intravenous contrast. COMPARISON:  CT from earlier in the same day. FINDINGS: Brain: There are again changes of edema and decreased attenuation in the distribution of the right posterior middle cerebral artery consistent with prior infarct. There is again noted some effacement of the right lateral ventricle as well as mild midline shift from right to left of approximately 4 mm. No acute hemorrhage is seen. No new infarct is noted. Vascular: No hyperdense  vessel or unexpected calcification. Skull: Normal. Negative for fracture or focal lesion. Sinuses/Orbits: No acute finding. Other: None. IMPRESSION: Evolving infarct involving the posterior aspect of the right middle cerebral artery. Persistent mass effect upon the right lateral ventricle is noted with mild midline shift. No new focal abnormality is seen. No herniation is seen. Electronically Signed   By: Inez Catalina M.D.   On: 10/19/2020 19:28   CT HEAD WO CONTRAST (5MM)  Result Date: 10/19/2020 CLINICAL DATA:  Stroke follow-up EXAM: CT HEAD WITHOUT CONTRAST TECHNIQUE: Contiguous axial images were obtained from the base of the skull through the vertex without intravenous contrast. COMPARISON:  None. FINDINGS: Brain: Evolving right posterior MCA territory infarct with increased area of cytotoxic edema now involving a greater portion of the temporal lobe. There is moderate mass effect on the right lateral ventricle. No hydrocephalus or entrapment. There is now leftward midline shift approximately 3 mm but no herniation. No acute hemorrhage. Vascular: Atherosclerotic calcification of the internal carotid arteries at the skull base. No abnormal hyperdensity of the major intracranial arteries or dural venous sinuses. Skull: The visualized skull base, calvarium and extracranial soft tissues are normal. Sinuses/Orbits: No fluid levels or advanced mucosal thickening of the visualized paranasal sinuses. No mastoid or middle ear effusion. The orbits are normal. IMPRESSION: 1. Evolving right posterior MCA territory infarct with  increased area of cytotoxic edema now involving a greater portion of the temporal lobe. 2. No acute hemorrhage. 3. Moderate mass effect on the right lateral ventricle with 3 mm leftward midline shift but no herniation. Electronically Signed   By: Ulyses Jarred M.D.   On: 10/19/2020 03:11   CT HEAD WO CONTRAST (5MM)  Result Date: 10/15/2020 CLINICAL DATA:  Neuro deficit, acute, stroke  suspected. New focal defects. EXAM: CT HEAD WITHOUT CONTRAST TECHNIQUE: Contiguous axial images were obtained from the base of the skull through the vertex without intravenous contrast. COMPARISON:  Head CT and MRI yesterday. FINDINGS: Brain: Confluent acute infarction in the right posterior temporal and temporoparietal junction regions shows discrete low-density by CT, with minimal petechial blood products but no frank hematoma. Smaller areas cortical and subcortical infarction in the right frontal and frontoparietal cortex and underlying white matter are seen by CT is subtle findings. No hemorrhage in that region. No new insult is identified. Mild swelling but no significant mass effect or any midline shift. No hydrocephalus. No extra-axial collection. Vascular: There is atherosclerotic calcification of the major vessels at the base of the brain. Skull: Negative Sinuses/Orbits: Clear/normal Other: None IMPRESSION: No apparent change since yesterday. Confluent infarction at the right posterior temporal and temporoparietal junction region with petechial blood products but no frank hematoma. Smaller more subtle infarctions of the cortical and subcortical brain in the right frontal and frontoparietal region appear unchanged. Electronically Signed   By: Nelson Chimes M.D.   On: 10/15/2020 15:16   CT HEAD WO CONTRAST (5MM)  Result Date: 10/14/2020 CLINICAL DATA:  Right-sided headache, recent stroke EXAM: CT HEAD WITHOUT CONTRAST TECHNIQUE: Contiguous axial images were obtained from the base of the skull through the vertex without intravenous contrast. COMPARISON:  10/06/2020 CT head, 10/07/2020 MRI head FINDINGS: Brain: Area of hypodensity in the right parietal and occipital lobes, which correlates with the area of infarct seen on the 10/06/2020 CT and 10/07/2020 MRI, with edema in this area. Subtle areas of hyperdensity, likely petechial hemorrhage. No evidence of hemorrhagic transformation. No new areas of  infarction. No significant midline shift. No mass or extra-axial collection. Vascular: No hyperdense vessel or unexpected calcification. Skull: Normal. Negative for fracture or focal lesion. Sinuses/Orbits: Mucosal thickening in the left frontal sinus and bilateral ethmoid air cells. The orbits are unremarkable. Other: None. IMPRESSION: Expected evolution of the previously noted right MCA/PCA territory infarct, with petechial hemorrhage and mild edema, without midline shift or hemorrhagic transformation. Electronically Signed   By: Merilyn Baba MD   On: 10/14/2020 10:02   CT HEAD WO CONTRAST (5MM)  Result Date: 10/06/2020 CLINICAL DATA:  Neuro deficit, acute, stroke suspected. Steady gait, left-sided weakness and left-sided vision loss, increased blood pressure, headache for 3 days, history of hypertension. EXAM: CT HEAD WITHOUT CONTRAST TECHNIQUE: Contiguous axial images were obtained from the base of the skull through the vertex without intravenous contrast. COMPARISON:  No pertinent prior exams available for comparison. FINDINGS: Brain: Cerebral volume is normal for age. Region of abnormal cortical/subcortical hypodensity measuring 6.3 x 3.3 x 5.8 cm within the right parietooccipital lobes compatible with acute/early subacute infarction (right PCA vascular territory and right MCA/PCA watershed territory). No significant mass effect at this time. No evidence of hemorrhagic conversion. Chronic appearing lacunar infarct within the left lentiform nucleus. No extra-axial fluid collection. No evidence of an intracranial mass. No midline shift. Vascular: No hyperdense vessel.  Atherosclerotic calcifications. Skull: Normal. Negative for fracture or focal lesion. Sinuses/Orbits: Visualized orbits show  no acute finding. Mild mucosal thickening within the frontal sinuses bilaterally. Mild-to-moderate mucosal thickening within the bilateral ethmoid air cells. These results were discussed by telephone at the time of  interpretation on 10/06/2020 at 1:55 pm with provider MATTHEW TRIFAN , who verbally acknowledged these results. IMPRESSION: 6.3 x 3.3 x 5.8 cm acute/early subacute cortical and subcortical infarct within the right parietooccipital lobes (right PCA vascular territory and right MCA/PCA watershed territory). Consider MR or CT angiography for further evaluation. Chronic left basal ganglia lacunar infarct. Paranasal sinus disease at the imaged levels, as described. Electronically Signed   By: Kellie Simmering DO   On: 10/06/2020 14:13   CT Angio Neck W and/or Wo Contrast  Result Date: 10/06/2020 CLINICAL DATA:  Stroke. TIA. Assess intracranial arteries. Unsteady gait. Left-sided weakness. Left-sided visual loss. Headache. EXAM: CT ANGIOGRAPHY HEAD AND NECK TECHNIQUE: Multidetector CT imaging of the head and neck was performed using the standard protocol during bolus administration of intravenous contrast. Multiplanar CT image reconstructions and MIPs were obtained to evaluate the vascular anatomy. Carotid stenosis measurements (when applicable) are obtained utilizing NASCET criteria, using the distal internal carotid diameter as the denominator. CONTRAST:  136m OMNIPAQUE IOHEXOL 350 MG/ML SOLN COMPARISON:  Head CT earlier same day FINDINGS: CTA NECK FINDINGS Aortic arch: Aortic atherosclerosis.  Branching pattern is normal. Right carotid system: Common carotid artery is tortuous but widely patent to the bifurcation. Carotid bifurcation is widely patent. Minimal calcified plaque at the distal ICA bulb but no stenosis. Cervical ICA widely patent. Left carotid system: Common carotid artery is tortuous but widely patent to the bifurcation. Carotid bifurcation is normal. Minimal calcified plaque at the ICA bulb but no stenosis. Cervical ICA widely patent beyond that. Vertebral arteries: Right vertebral artery origin is widely patent. Left vertebral artery origin is poorly seen because of regional venous reflux. Both vertebral  arteries do appear patent through the cervical region to the foramen magnum. Skeleton: Ordinary cervical spondylosis. Other neck: No mass or lymphadenopathy. Upper chest: Upper lungs are clear. Review of the MIP images confirms the above findings CTA HEAD FINDINGS Anterior circulation: Both internal carotid arteries are patent through the skull base and siphon regions. There is ordinary siphon atherosclerotic calcification. On the left, there is no stenosis. On the right, there is 50-70% stenosis in the siphon. Supraclinoid internal carotid arteries are widely patent. The anterior and middle cerebral vessels are patent. No large vessel occlusion, with particular attention to the right MCA. Posterior circulation: Both vertebral arteries are patent through the foramen magnum to the basilar. No basilar stenosis. Posterior circulation branch vessels are normal. Venous sinuses: Patent and normal. Anatomic variants: None significant. Review of the MIP images confirms the above findings IMPRESSION: No intracranial large vessel occlusion identified at this time, with specific attention to the right MCA branches. Minimal atherosclerotic change at both internal carotid artery bulbs. No stenosis. 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 No stenosis on the left. 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 Electronically Signed   By: MNelson ChimesM.D.   On: 10/06/2020 15:01   MR BRAIN WO CONTRAST  Result Date: 10/14/2020 CLINICAL DATA:  Stroke, follow up.  Eval for progression of stroke. EXAM: MRI HEAD WITHOUT CONTRAST TECHNIQUE: Multiplanar, multiecho pulse sequences of the brain and surrounding structures were obtained without intravenous contrast. COMPARISON:  Head CT 10/14/2020 and MRI 10/07/2020 FINDINGS: The study is intermittently motion degraded including severe motion on the coronal T2 sequence. Brain: The moderately large infarct involving the  posterior right MCA territory and PCA border zone has  mildly expanded compared to the prior MRI, most notable anteriorly and superiorly. Associated petechial hemorrhage is partially confluent and has increased from the prior MRI. There is progressive cytotoxic edema with regional sulcal effacement and mass effect on the right lateral ventricle. New mildly restricted diffusion in the splenium of the corpus callosum may reflect acute wallerian degeneration. Small cortical and subcortical infarcts in the right frontal lobe (border zone distribution) have also mildly increased from the prior MRI. Small T2 hyperintensities elsewhere in the cerebral white matter bilaterally are similar to the prior MRI and are nonspecific but compatible with mild chronic small vessel ischemic disease. There is no midline shift or extra-axial fluid collection. A chronic hemorrhagic infarct is again noted in the posterior aspect of the left lentiform nucleus/external capsule. Vascular: Major intracranial vascular flow voids are preserved. Skull and upper cervical spine: Unremarkable bone marrow signal para Sinuses/Orbits: Unremarkable orbits. Mild mucosal thickening in the paranasal sinuses. Clear mastoid air cells. Other: None. IMPRESSION: 1. Mild enlargement of the right MCA/border zone infarct since the 10/07/2020 MRI. Increased cytotoxic edema and petechial hemorrhage without midline shift. 2. Chronic small-vessel ischemia including an old hemorrhagic infarct in the left basal ganglia. Electronically Signed   By: Logan Bores M.D.   On: 10/14/2020 18:22   MR BRAIN WO CONTRAST  Result Date: 10/07/2020 CLINICAL DATA:  Neuro deficit, acute, stroke suspected. Additional history provided: Vision change and mild left-sided weakness which began 2 days ago. EXAM: MRI HEAD WITHOUT CONTRAST TECHNIQUE: Multiplanar, multiecho pulse sequences of the brain and surrounding structures were obtained without intravenous contrast. COMPARISON:  Noncontrast head CT 10/06/2020. CT angiogram head/neck  10/06/2020. FINDINGS: Brain: Cerebral volume is normal. Region of abnormal cortical/subcortical restricted diffusion within the right parietal and occipital lobes measuring 6.2 x 3.3 x 6.4 cm compatible with acute/early subacute infarction (at the junction of the right MCA and PCA vascular territories). This has not significantly changed in extent as compared to the head CT performed yesterday 10/06/2020. Mild T1 hyperintensity and SWI signal loss within the infarction territory compatible with petechial hemorrhage and possibly cortical laminar necrosis. No significant mass effect at this time. There are numerous additional small patchy cortical and subcortical acute/early subacute infarcts within the right frontal and parietal lobes (watershed distribution). Background mild multifocal T2/FLAIR hyperintensity within the cerebral white matter, nonspecific but compatible with chronic small vessel ischemic disease. Chronic lacunar infarct with associated chronic hemosiderin deposition within the left basal ganglia/posterior limb of left internal capsule. No evidence of an intracranial mass. No extra-axial fluid collection. No midline shift. Vascular: Maintained proximal arterial flow voids. Skull and upper cervical spine: No focal suspicious marrow lesion. Incompletely assessed cervical spondylosis. Sinuses/Orbits: Visualized orbits show no acute finding. 11 mm left maxillary sinus mucous retention cyst. Moderate bilateral ethmoid sinus mucosal thickening. Trace mucosal thickening within the left frontal sinus. IMPRESSION: 6.2 x 6.4 cm acute/early subacute cortical and subcortical infarction within the right parietal and occipital lobes (at the junction of the right MCA and PCA territories). The infarct has not significantly changed in extent as compared to the head CT of 10/06/2020. Mild petechial hemorrhage within the infarction territory. No significant mass effect at this time. Numerous additional small patchy  cortical and subcortical acute/early subacute infarcts within the right frontal and parietal lobes (watershed distribution). 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 Background mild chronic small vessel ischemic changes within the cerebral white  matter. Paranasal sinus disease at the imaged levels, as described. Electronically Signed   By: Kellie Simmering DO   On: 10/07/2020 07:57   DG Abd Portable 1V  Result Date: 10/22/2020 CLINICAL DATA:  Encounter for NG tube EXAM: PORTABLE ABDOMEN - 1 VIEW COMPARISON:  None. FINDINGS: There is a feeding tube with tip overlying the distal stomach. There are some prominent loops of small bowel but there is no evidence of obstruction. Multilevel degenerative changes of the spine. IMPRESSION: Feeding tube tip overlies the region of the distal stomach. Electronically Signed   By: Maurine Simmering M.D.   On: 10/22/2020 13:00   DG Swallowing Func-Speech Pathology  Result Date: 10/21/2020 Table formatting from the original result was not included. Objective Swallowing Evaluation: Type of Study: MBS-Modified Barium Swallow Study  Patient Details Name: Clifford [name] MRN: [id number] Date of Birth: 1950/03/15 Today's Date: 10/21/2020 Time: SLP Start Time (ACUTE ONLY): 0900 -SLP Stop Time (ACUTE ONLY): 0910 SLP Time Calculation (min) (ACUTE ONLY): 10 min Past Medical History: Past Medical History: Diagnosis Date  BPH (benign prostatic hyperplasia)   Chronic anticoagulation 10/14/2020  Essential hypertension 10/06/2020  History of pulmonary embolism 10/14/2020  HLD (hyperlipidemia)   HTN (hypertension)   Lung nodule   6m in 2015  [place]   Mixed hyperlipidemia 10/06/2020  Nicotine dependence, cigarettes, uncomplicated [id number] [place]ipheral vascular disease ([place]   Polyp of colon   Prediabetes 10/14/2020  Pulmonary embolus ([place]   [place] 10/[place]2020 Past Surgical History: Past Surgical History: Procedure  Laterality Date  COLONOSCOPY  03/2017  PARTIAL NEPHRECTOMY Left 02/2018  PROSTATE BIOPSY  2015 HPI: [id number] admitted 10/14/20 with right side headache and LUE weakness. 10/19/20 pt was noted to have increased LUE weakness. 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 CTHead = evolving RMCA infarct with cytotoxic edema, mass effect  Subjective: alert, requires cueing Assessment / Plan / Recommendation CH[facility] IP CLINICAL IMPRESSIONS 10/21/2020 Clinical Impression Pt unfortunately with very limited MBSS. Pt with eyes open during exam, responding some to simple commands but exhibited significant cognitive deficits impairing ability for swallow sequencing. Orally pt with persistent left sided oral motor deficits post CVA that contributed to left anterior spillage, oral holding of bolus, poor bolus cohesion, with minimal to no bolus manipulation. Despite multimodal cues pt was unable to propel any of tested consistencies to pharynx. Contents spilled from oral cavity and remainder was cleaned by SLP with oral care post exam. Attempted nectar thick via tsp and cup, and puree textures. Per chart review, this appears consistent with documented concern from nursing yesterday evening. Recommend NPO with consideration of short term means of alternative nutrition, continued SLP intervention with hopes of PO diet reinitiation as pt clinically tolerating. SLP notified MD and RN of recommendations and will continue to closely follow. SLP Visit Diagnosis Dysphagia, oral phase (R13.11) Attention and concentration deficit following -- Frontal lobe and executive function deficit following -- Impact on safety and function Moderate aspiration risk;Severe aspiration risk;Risk for inadequate nutrition/hydration   [facility] IP TREATMENT RECOMMENDATION 10/21/2020 Treatment Recommendations Therapy as outlined in treatment plan below   Prognosis 10/21/2020 Prognosis for Safe Diet  Advancement Fair Barriers to Reach Goals Cognitive deficits;Severity of deficits Barriers/Prognosis Comment -- [facility] IP DIET RECOMMENDATION 10/21/2020 SLP Diet Recommendations NPO;Alternative means - temporary Liquid Administration via -- Medication Administration Via alternative means Compensations -- Postural Changes --   CH[facility] IP OTHER  RECOMMENDATIONS 10/21/2020 Recommended Consults -- Oral Care Recommendations Oral care QID Other Recommendations --   [facility] IP FOLLOW UP RECOMMENDATIONS 10/21/2020 Follow up Recommendations 24 hour supervision/assistance;Skilled Nursing facility   [facility] IP FREQUENCY AND DURATION 10/21/2020 Speech Therapy Frequency (ACUTE ONLY) min 3x week Treatment Duration 2 weeks      CHL IP ORAL PHASE 10/21/2020 Oral Phase Impaired Oral - Pudding Teaspoon -- Oral - Pudding Cup -- Oral - Honey Teaspoon -- Oral - Honey Cup -- Oral - Nectar Teaspoon Left anterior bolus loss;Weak lingual manipulation;Reduced posterior propulsion;Holding of bolus;Pocketing in anterior sulcus;Lingual/palatal residue;Delayed oral transit;Decreased bolus cohesion Oral - Nectar Cup Left anterior bolus loss;Weak lingual manipulation;Reduced posterior propulsion;Holding of bolus;Left pocketing in lateral sulci;Pocketing in anterior sulcus;Lingual/palatal residue;Delayed oral transit Oral - Nectar Straw -- Oral - Thin Teaspoon -- Oral - Thin Cup -- Oral - Thin Straw -- Oral - Puree Reduced posterior propulsion;Holding of bolus;Left pocketing in lateral sulci;Lingual/palatal residue;Delayed oral transit;Decreased bolus cohesion Oral - Mech Soft -- Oral - Regular -- Oral - Multi-Consistency -- Oral - Pill -- Oral Phase - Comment --  CHL IP PHARYNGEAL PHASE 10/21/2020 Pharyngeal Phase Impaired Pharyngeal- Pudding Teaspoon -- Pharyngeal -- Pharyngeal- Pudding Cup -- Pharyngeal -- Pharyngeal- Honey Teaspoon -- Pharyngeal -- Pharyngeal- Honey Cup -- Pharyngeal -- Pharyngeal- Nectar Teaspoon Other (Comment) Pharyngeal -- Pharyngeal- Nectar Cup  Other (Comment) Pharyngeal -- Pharyngeal- Nectar Straw -- Pharyngeal -- Pharyngeal- Thin Teaspoon -- Pharyngeal -- Pharyngeal- Thin Cup -- Pharyngeal -- Pharyngeal- Thin Straw -- Pharyngeal -- Pharyngeal- Puree Other (Comment) Pharyngeal -- Pharyngeal- Mechanical Soft -- Pharyngeal -- Pharyngeal- Regular -- Pharyngeal -- Pharyngeal- Multi-consistency -- Pharyngeal -- Pharyngeal- Pill -- Pharyngeal -- Pharyngeal Comment --  No flowsheet data found. [place] MA, CCC-SLP 10/21/2020, 9:35 AM              EEG adult  Result Date: 10/15/2020 Lora Havens, MD     10/15/2020 10:31 AM Patient Name: Clifford [name] MRN: [id number] Epilepsy Attending: Lora Havens Referring Physician/Provider: Dr Lesleigh Noe Date: 10/15/2020 Duration: 23.02 mins Patient history: 70 year old male with posterior right parietal watershed infarct who presented with worsening stroke symptoms as well as left thumb rhythmic and nonsuppressible shaking movements.  EEG to evaluate for seizures. Level of alertness: Awake, asleep AEDs during EEG study: LEV Technical aspects: This EEG study was done with scalp electrodes positioned according to the 10-20 International system of electrode placement. Electrical activity was acquired at a sampling rate of '500Hz'$  and reviewed with a high frequency filter of '70Hz'$  and a low frequency filter of '1Hz'$ . EEG data were recorded continuously and digitally stored. Description: The posterior dominant rhythm consists of 8-9 Hz activity of moderate voltage (25-35 uV) seen predominantly in posterior head regions, asymmetric ( R<L) and reactive to eye opening and eye closing. Sleep was characterized by vertex waves, sleep spindles (12 to 14 Hz), maximal frontocentral region.  EEG showed continuous rhythmic 3 to 5 Hz theta-delta slowing in right hemisphere. Hyperventilation and photic stimulation were not performed.   ABNORMALITY - Continuous slow, right hemisphere - Background asymmetry, right<left  IMPRESSION: This study is suggestive of cortical dysfunction in right hemisphere, likely secondary to underlying stroke.  No seizures or epileptiform discharges were seen throughout the recording. Priyanka Barbra Sarks   Overnight EEG with video  Result Date: 10/16/2020 Lora Havens, MD     10/16/2020  3:23 PM Patient Name: Clifford [name] MRN: [id number] Epilepsy Attending: Lora Havens Referring Physician/Provider: Dr Cornelius Moras  Xu Duration: 10/15/2020 1639 to 10/16/2020 0230  Patient history: 70 year old male with posterior right parietal watershed infarct who presented with worsening stroke symptoms as well as left thumb rhythmic and nonsuppressible shaking movements.  EEG to evaluate for seizures.  Level of alertness: Awake, asleep  AEDs during EEG study: LEV  Technical aspects: This EEG study was done with scalp electrodes positioned according to the 10-20 International system of electrode placement. Electrical activity was acquired at a sampling rate of '500Hz'$  and reviewed with a high frequency filter of '70Hz'$  and a low frequency filter of '1Hz'$ . EEG data were recorded continuously and digitally stored.  Description: The posterior dominant rhythm consists of 8-9 Hz activity of moderate voltage (25-35 uV) seen predominantly in posterior head regions, asymmetric ( R<L) and reactive to eye opening and eye closing. Sleep was characterized by vertex waves, sleep spindles (12 to 14 Hz), maximal frontocentral region.  EEG showed continuous rhythmic 3 to 5 Hz theta-delta slowing in right hemisphere. Hyperventilation and photic stimulation were not performed. EEG was not interpretable after 0230 on 10/16/2020 due to significant electrode artifact.   ABNORMALITY - Continuous slow, right hemisphere - Background asymmetry, right<left  IMPRESSION: This study is suggestive of cortical dysfunction in right hemisphere, likely secondary to underlying stroke.  No seizures or epileptiform discharges were seen throughout the recording.   Lora Havens   ECHOCARDIOGRAM COMPLETE  Result Date: 10/07/2020    ECHOCARDIOGRAM REPORT   Patient Name:   Clifford [name] Date of Exam: 10/07/2020 Medical Rec #:  [id number]    Height:       65.0 in Accession #:    [id number]   Weight: Date of Birth:  1950/12/19     BSA: Patient Age:    47 years     BP:           115/58 mmHg Patient Gender: M            HR:           66 bpm. Exam Location:  Inpatient Procedure: 2D Echo, Cardiac Doppler and Color Doppler Indications:    Stroke I63.9  History:        Patient has no prior history of Echocardiogram examinations.                 Risk Factors:Hypertension and Dyslipidemia. PVD. PE.  Sonographer:    Vickie Epley RDCS Referring Phys: [place]  1. Left ventricular ejection fraction, by estimation, is 65 to 70%. The left ventricle has hyperdynamic function. The left ventricle has no regional wall motion abnormalities. Left ventricular diastolic parameters are consistent with Grade I diastolic dysfunction (impaired relaxation).  2. Right ventricular systolic function is normal. The right ventricular size is normal. Tricuspid regurgitation signal is inadequate for assessing PA pressure.  3. The mitral valve is normal in structure. No evidence of mitral valve regurgitation. No evidence of mitral stenosis.  4. The aortic valve is tricuspid. Aortic valve regurgitation is not visualized. No aortic stenosis is present.  5. The inferior vena cava is normal in size with greater than 50% respiratory variability, suggesting right atrial pressure of 3 mmHg. FINDINGS  Left Ventricle: Left ventricular ejection fraction, by estimation, is 65 to 70%. The left ventricle has hyperdynamic function. The left ventricle has no regional wall motion abnormalities. The left ventricular internal cavity size was normal in size. There is no left ventricular hypertrophy. Left ventricular diastolic parameters are consistent with Grade I diastolic dysfunction (impaired  relaxation).  Right Ventricle: The right ventricular size is normal. No increase in right ventricular wall thickness. Right ventricular systolic function is normal. Tricuspid regurgitation signal is inadequate for assessing PA pressure. Left Atrium: Left atrial size was normal in size. Right Atrium: Right atrial size was normal in size. Pericardium: There is no evidence of pericardial effusion. Mitral Valve: The mitral valve is normal in structure. No evidence of mitral valve regurgitation. No evidence of mitral valve stenosis. Tricuspid Valve: The tricuspid valve is normal in structure. Tricuspid valve regurgitation is not demonstrated. Aortic Valve: The aortic valve is tricuspid. Aortic valve regurgitation is not visualized. No aortic stenosis is present. Pulmonic Valve: The pulmonic valve was normal in structure. Pulmonic valve regurgitation is not visualized. Aorta: The aortic root is normal in size and structure. Venous: The inferior vena cava is normal in size with greater than 50% respiratory variability, suggesting right atrial pressure of 3 mmHg. IAS/Shunts: No atrial level shunt detected by color flow Doppler.  LEFT VENTRICLE PLAX 2D LVIDd:         3.60 cm      Diastology LVIDs:         2.50 cm      LV e' medial:    6.00 cm/s LV PW:         1.00 cm      LV E/e' medial:  7.4 LV IVS:        1.10 cm      LV e' lateral:   7.62 cm/s LVOT diam:     2.20 cm      LV E/e' lateral: 5.8 LV SV:         97 LVOT Area:     3.80 cm  LV Volumes (MOD) LV vol d, MOD A2C: 98.1 ml LV vol d, MOD A4C: 108.0 ml LV vol s, MOD A2C: 39.6 ml LV vol s, MOD A4C: 40.2 ml LV SV MOD A2C:     58.5 ml LV SV MOD A4C:     108.0 ml LV SV MOD BP:      64.0 ml RIGHT VENTRICLE RV S prime:     14.80 cm/s TAPSE (M-mode): 2.3 cm LEFT ATRIUM           RIGHT ATRIUM LA diam:      3.20 cm RA Area:     14.70 cm LA Vol (A2C): 31.0 ml RA Volume:   36.60 ml LA Vol (A4C): 24.7 ml  AORTIC VALVE LVOT Vmax:   129.00 cm/s LVOT Vmean:  96.600 cm/s LVOT VTI:    0.254 m  AORTA  Ao Root diam: 3.50 cm Ao Asc diam:  3.50 cm MITRAL VALVE MV Area (PHT): 2.41 cm    SHUNTS MV Decel Time: 315 msec    Systemic VTI:  0.25 m MV E velocity: 44.50 cm/s  Systemic Diam: 2.20 cm MV A velocity: 65.60 cm/s MV E/A ratio:  0.68 Loralie Champagne MD Electronically signed by Loralie Champagne MD Signature Date/Time: 10/07/2020/5:59:38 PM    Final     TTE  IMPRESSIONS:  1. Left ventricular ejection fraction, by estimation, is 65 to 70%. The  left ventricle has hyperdynamic function. The left ventricle has no  regional wall motion abnormalities. Left ventricular diastolic parameters  are consistent with Grade I diastolic  dysfunction (impaired relaxation).   2. Right ventricular systolic function is normal. The right ventricular  size is normal. Tricuspid regurgitation signal is inadequate for assessing  PA pressure.   3. The mitral valve  is normal in structure. No evidence of mitral valve  regurgitation. No evidence of mitral stenosis.   4. The aortic valve is tricuspid. Aortic valve regurgitation is not  visualized. No aortic stenosis is present.   5. The inferior vena cava is normal in size with greater than 50%  respiratory variability, suggesting right atrial pressure of 3 mmHg.   EEG: IMPRESSION: This study is suggestive of cortical dysfunction in right hemisphere, likely secondary to underlying stroke.  No seizures or epileptiform discharges were seen throughout the recording.    PHYSICAL EXAM  Temp:  [98.3 F (36.8 C)-98.4 F (36.9 C)] 98.3 F (36.8 C) (08/19 1702) Pulse Rate:  [52-82] 56 (08/19 1702) Resp:  [18-20] 18 (08/19 1702) BP: (140-176)/(66-86) 151/71 (08/19 1702) SpO2:  [97 %-100 %] 100 % (08/19 1702) Weight:  [93 kg] 93 kg (08/19 0500)  General -obese elderly African-American male in no apparent distress.  Ophthalmologic - fundi not visualized due to noncooperation.  Cardiovascular - Regular rhythm and rate.  Mental Status -  Patient is awake and interactive  and follows commands well.  He is oriented to time place and person.  Speech and language is intact.  Cranial Nerves II - XII - II - Visual field intact OU. III, IV, VI - Extraocular movements intact. V - Facial sensation intact bilaterally. VII - Facial movement intact bilaterally. VIII - Hearing & vestibular intact bilaterally. X - Palate elevates symmetrically. XII - Tongue protrusion intact.  Motor Strength - The patient's strength was normal 5/5 right hemibody.  Left hemiparesis.  With 0/5 strength.  Reflexes - The patient's reflexes were symmetrical in all extremities and he had no pathological reflexes.  Sensory - Light touch intact b/l.  Mild inattention on the left.  Coordination - no twitching noted today. Normal tone.   Gait and Station - deferred.   ASSESSMENT/PLAN Clifford [name] is a 70 y.o. 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 no tPA given due to risk of bleeding  Increased petechial hemorrhage/evolution of previous right MCA/PCA territory infarct with mild edema with mild 3 mm midline shift on repeat CT 10/19/2020  MRI mild enlargement with cytotoxic edema and petechial hemorrhage without midline shift compared to 8/4 2D Echo grade 1 diastolic dysfunction, EF 65 to 70% LDL 75 HgbA1c 5.9% SCDs for VTE prophylaxis Pradaxa (dabigatran) twice a day prior to admission, now on No antithrombotic.  Therapy recommendations:  pending Disposition:  pending  LUE non suppressible twitching/weakness, concern for focal seizure activity LTM right hemispheric slowing but no definite epileptiform activity   continue keppra. Since he is stable, no need for additional AED. Continue to monitor.  Hypertension Stable SBP goal 130-150 Long term BP goal normotensive  Hyperlipidemia Home meds:  lipitor 80 mg  LDL 75, goal < 70 on lipitor 40 mg Continue statin at discharge  Other Stroke Risk Factors Advanced age Cigarette smoker, advised to stop smoking Obesity, Body mass index is 34.12 kg/m.  Hx stroke/TIA  Hospital day # 8 Continue tube feeds via core track tube and had speech therapy to swallow eval next week..  Continue Pradaxa for secondary stroke prevention when patient is able to swallow.  .  Transfer  to rehab next week when bed available.  Discussed with Dr. Marthenia Rolling and patient's daughter at the bedside.     Greater than 50% time during this 25-minute visit was spent on counseling and coordination of care and discussion with care team.  Stroke team will sign off.  Kindly call for questions.  Antony Contras MD   To contact Stroke Continuity provider, please refer to [email or web address] After hours, contact General Neurology

## 2020-10-23 DIAGNOSIS — R531 Weakness: Secondary | ICD-10-CM | POA: Diagnosis not present

## 2020-10-23 LAB — GLUCOSE, CAPILLARY
Glucose-Capillary: 122 mg/dL — ABNORMAL HIGH (ref 70–99)
Glucose-Capillary: 125 mg/dL — ABNORMAL HIGH (ref 70–99)
Glucose-Capillary: 126 mg/dL — ABNORMAL HIGH (ref 70–99)
Glucose-Capillary: 129 mg/dL — ABNORMAL HIGH (ref 70–99)
Glucose-Capillary: 131 mg/dL — ABNORMAL HIGH (ref 70–99)
Glucose-Capillary: 133 mg/dL — ABNORMAL HIGH (ref 70–99)
Glucose-Capillary: 144 mg/dL — ABNORMAL HIGH (ref 70–99)

## 2020-10-23 LAB — MAGNESIUM
Magnesium: 2.3 mg/dL (ref 1.7–2.4)
Magnesium: 2.2 mg/dL (ref 1.7–2.4)

## 2020-10-23 LAB — PHOSPHORUS
Phosphorus: 3 mg/dL (ref 2.5–4.6)
Phosphorus: 2.7 mg/dL (ref 2.5–4.6)

## 2020-10-23 MED ORDER — AMLODIPINE BESYLATE 10 MG PO TABS
10.0000 mg | ORAL_TABLET | Freq: Every day | ORAL | Status: DC
  Administered 2020-10-23 – 2020-11-30 (×38): 10 mg
  Filled 2020-10-23 (×6): qty 1
  Filled 2020-10-23: qty 4
  Filled 2020-10-23 (×7): qty 1
  Filled 2020-10-23: qty 4
  Filled 2020-10-23 (×21): qty 1

## 2020-10-23 MED ORDER — ATORVASTATIN CALCIUM 40 MG PO TABS
40.0000 mg | ORAL_TABLET | Freq: Every day | ORAL | Status: DC
  Administered 2020-10-23 – 2020-12-14 (×52): 40 mg
  Filled 2020-10-23 (×12): qty 1
  Filled 2020-10-23: qty 4
  Filled 2020-10-23 (×38): qty 1

## 2020-10-23 MED ORDER — INSULIN ASPART 100 UNIT/ML IJ SOLN
0.0000 [IU] | INTRAMUSCULAR | Status: DC
  Administered 2020-10-23 – 2020-10-31 (×34): 1 [IU] via SUBCUTANEOUS
  Administered 2020-10-31: 2 [IU] via SUBCUTANEOUS
  Administered 2020-10-31 (×2): 1 [IU] via SUBCUTANEOUS
  Administered 2020-10-31: 2 [IU] via SUBCUTANEOUS
  Administered 2020-10-31: 1 [IU] via SUBCUTANEOUS
  Administered 2020-11-01 (×2): 2 [IU] via SUBCUTANEOUS
  Administered 2020-11-01 – 2020-11-08 (×30): 1 [IU] via SUBCUTANEOUS
  Administered 2020-11-09 (×2): 2 [IU] via SUBCUTANEOUS
  Administered 2020-11-09 (×3): 1 [IU] via SUBCUTANEOUS
  Administered 2020-11-10: 2 [IU] via SUBCUTANEOUS
  Administered 2020-11-10 (×2): 1 [IU] via SUBCUTANEOUS
  Administered 2020-11-10: 2 [IU] via SUBCUTANEOUS
  Administered 2020-11-11 (×5): 1 [IU] via SUBCUTANEOUS
  Administered 2020-11-11: 2 [IU] via SUBCUTANEOUS
  Administered 2020-11-12 – 2020-11-21 (×12): 1 [IU] via SUBCUTANEOUS
  Administered 2020-11-21: 2 [IU] via SUBCUTANEOUS
  Administered 2020-11-22 – 2020-11-30 (×34): 1 [IU] via SUBCUTANEOUS
  Administered 2020-12-01: 2 [IU] via SUBCUTANEOUS
  Administered 2020-12-01 – 2020-12-14 (×43): 1 [IU] via SUBCUTANEOUS

## 2020-10-23 MED ORDER — POLYETHYLENE GLYCOL 3350 17 G PO PACK: 17.0000 g | PACK | Freq: Every day | ORAL | Status: DC | PRN

## 2020-10-23 MED ORDER — BUTALBITAL-APAP-CAFFEINE 50-325-40 MG PO TABS: 1.0000 | ORAL_TABLET | Freq: Three times a day (TID) | ORAL | Status: DC | PRN

## 2020-10-23 NOTE — Progress Notes (Signed)
   10/22/20 2121  Provider Notification  Provider Name/Title Dr Olevia Bowens  Date Provider Notified 10/22/20  Time Provider Notified 2121  Notification Type Page  Notification Reason Other (Comment) (Pt's Keppra and Pradaxa needs to change to per tube, cannot be crushed)  Provider response See new orders  Date of Provider Response 10/22/20  Time of Provider Response 2200

## 2020-10-23 NOTE — Progress Notes (Signed)
PROGRESS NOTE    Clifford [name]  [id number] DOB: 24-Jun-1950 DOA: 10/14/2020 PCP: Pcp, No   Chief Complain:AMS  Brief Narrative:  Patient is a 70 year old male with history of diastolic congestive heart failure, hypertension, hyperlipidemia, PE on anticoagulation, recent right MCA/PCA watershed ischemic stroke who presented with increasing left upper arm weakness, left visual defect.  MRI done on presentation showed evolving/increased size of stroke, petechial hemorrhages, cytotoxic edema, concern for new seizures.  Neurology was consulted and were following.  In the early morning of 10/19/2020, patient was noticed to have increased left upper extremity weakness.  CT head follow-up showed evolving right posterior MCA territory infarct with increased cytotoxic edema, mass-effect.  Neurology re-consulted.Eventual plan is to discharge to SnF as per PT/OT  10/22/2020: Patient seen alongside 2 daughters.  No new changes.  Patient is awaiting disposition. 10/31/2020: Patient seen.  Patient remains somnolent.  No significant history from patient.  Left-sided hemiplegia noted.  Dysphagia also listed.  No significant history for appreciated.  Awaiting disposition.  Assessment & Plan:   Principal Problem:   Left-sided weakness Active Problems:   Stroke [facility])   Essential hypertension   Mixed hyperlipidemia   Prediabetes   History of pulmonary embolism   Chronic anticoagulation   Nicotine dependence, cigarettes, uncomplicated   Petechial hemorrhage   Acute stroke due to ischemia [facility])   Left-sided weakness: Secondary to worsening right parietal occipital MCA/PCA watershed stroke complicated by worsening petechial hemorrhage.  MRI of the brain reviewed.  Pradaxa was held now restarted.  Repeat CT head on 8/12 did not show any apparent change.  Neurology were following.  His left-sided weakness was improving.  In the early morning of 10/19/2020, patient was noticed to have increased left upper  extremity weakness.  CT head follow-up showed evolving right posterior MCA territory infarct with increased cytotoxic edema, mass-effect.  Neurology re-consulted.  Now he has dense hemiplegia on the left side Eventual plan is to discharge to SNF as per PT/OT   Concern for seizure: Likely precipitated in the setting of worsening stroke with petechial hemorrhage.  EEG showed cortical dysfunction , no epileptiform discharges.  Currently on Keppra.  Hypertension: Continue monitoring blood pressure.   On hydrochlorothiazide and losartan at home.  Resumed  AKI: Most likely secondary to poor oral intake.  Will start on gentle IV fluids.  Sinus bradycardia: Asymptomatic.  Continue to monitor.  Hyperlipidemia: On Lipitor.  Recent LDL of 75.  Prediabetes: Monitor blood sugars.  Carbohydrate consistent diet.  History of multiple pulmonary embolism: On lifelong anticoagulation.  Takes Pradaxa at home which was held due to finding of petechial hemorrhage,now restarted  Tobacco abuse: Counseled cessation.  Declined nicotine patch  Morbid obesity: BMI of 35.1.  We recommend weight loss, healthy lifestyle, PCP follow-up    Nutrition Problem: Inadequate oral intake Etiology: dysphagia      DVT prophylaxis:Pradaxa Code Status: Full Family Communication: Discussed with daughter and brother at bedside on 10/19/2020  Remains inpatient appropriate because:Unsafe d/c plan  Dispo: The patient is from: Home              Anticipated d/c is to: SNF              Patient currently is medically stable for dc   Difficult to place patient No     Consultants: neurology  Procedures:EEG  Antimicrobials:  Anti-infectives (From admission, onward)    None       Subjective: No significant history from patient.  Objective: Vitals:   10/23/20 0334 10/23/20 0451 10/23/20 0733 10/23/20 1104  BP: 120/60  112/63 (!) 169/72  Pulse: (!) 56  (!) 54   Resp: '18  18 18  '$ Temp: 98.5 F (36.9 C)  98.2 F  (36.8 C) 98.4 F (36.9 C)  TempSrc: Oral   Oral  SpO2: 98%  100% 99%  Weight:  88.5 kg    Height:        Intake/Output Summary (Last 24 hours) at 10/23/2020 1142 Last data filed at 10/23/2020 0300 Gross per 24 hour  Intake 1549.51 ml  Output 300 ml  Net 1249.51 ml    Filed Weights   10/14/20 0840 10/22/20 0500 10/23/20 0451  Weight: 95.7 kg 93 kg 88.5 kg    Examination:  General exam: Patient remains somnolent.   HEENT: PERRL Respiratory system:  no wheezes or crackles  Cardiovascular system: S1 & S2 heard, RRR.  Gastrointestinal system: Abdomen is nondistended, soft and nontender. Central nervous system: Somnolent.  Dysphasic.  Left-sided hemiplegia.   Extremities: No edema, no clubbing ,no cyanosis  Data Reviewed: I have personally reviewed following labs and imaging studies  CBC: No results for input(s): WBC, NEUTROABS, HGB, HCT, MCV, PLT in the last 168 hours.  Basic Metabolic Panel: Recent Labs  Lab 10/20/20 0142 10/21/20 0300 10/22/20 0509 10/22/20 1719 10/23/20 0437  NA 135 136 137  --   --   K 3.8 4.1 4.1  --   --   CL 103 101 105  --   --   CO2 '24 23 23  '$ --   --   GLUCOSE 118* 113* 103*  --   --   BUN '18 19 19  '$ --   --   CREATININE 1.40* 1.43* 1.31*  --   --   CALCIUM 8.8* 9.2 8.9  --   --   MG  --   --  2.1 2.2 2.3  PHOS  --   --  3.4 3.0 3.0    GFR: Estimated Creatinine Clearance: 52.9 mL/min (A) (by C-G formula based on SCr of 1.31 mg/dL (H)). Liver Function Tests: No results for input(s): AST, ALT, ALKPHOS, BILITOT, PROT, ALBUMIN in the last 168 hours.  No results for input(s): LIPASE, AMYLASE in the last 168 hours. No results for input(s): AMMONIA in the last 168 hours. Coagulation Profile: No results for input(s): INR, PROTIME in the last 168 hours.  Cardiac Enzymes: No results for input(s): CKTOTAL, CKMB, CKMBINDEX, TROPONINI in the last 168 hours. BNP (last 3 results) No results for input(s): PROBNP in the last 8760  hours. HbA1C: No results for input(s): HGBA1C in the last 72 hours. CBG: Recent Labs  Lab 10/22/20 2043 10/23/20 0026 10/23/20 0204 10/23/20 0332 10/23/20 0803  GLUCAP 120* 133* 125* 131* 144*    Lipid Profile: No results for input(s): CHOL, HDL, LDLCALC, TRIG, CHOLHDL, LDLDIRECT in the last 72 hours. Thyroid Function Tests: No results for input(s): TSH, T4TOTAL, FREET4, T3FREE, THYROIDAB in the last 72 hours. Anemia Panel: No results for input(s): VITAMINB12, FOLATE, FERRITIN, TIBC, IRON, RETICCTPCT in the last 72 hours. Sepsis Labs: No results for input(s): PROCALCITON, LATICACIDVEN in the last 168 hours.  Recent Results (from the past 240 hour(s))  Resp Panel by RT-PCR (Flu A&B, Covid) Nasopharyngeal Swab     Status: None   Collection Time: 10/14/20  7:06 PM   Specimen: Nasopharyngeal Swab; Nasopharyngeal(NP) swabs in vial transport medium  Result Value Ref Range Status   SARS Coronavirus 2 by  RT PCR NEGATIVE NEGATIVE Final    Comment: (NOTE) SARS-CoV-2 target nucleic acids are NOT DETECTED.  The SARS-CoV-2 RNA is generally detectable in upper respiratory specimens during the acute phase of infection. The lowest concentration of SARS-CoV-2 viral copies this assay can detect is 138 copies/mL. A negative result does not preclude SARS-Cov-2 infection and should not be used as the sole basis for treatment or other patient management decisions. A negative result may occur with  improper specimen collection/handling, submission of specimen other than nasopharyngeal swab, presence of viral mutation(s) within the areas targeted by this assay, and inadequate number of viral copies(<138 copies/mL). A negative result must be combined with clinical observations, patient history, and epidemiological information. The expected result is Negative.  Fact Sheet for Patients:  EntrepreneurPulse.com.au  Fact Sheet for Healthcare Providers:   IncredibleEmployment.be  This test is no t yet approved or cleared by the [place] FDA and  has been authorized for detection and/or diagnosis of SARS-CoV-2 by FDA under an Emergency Use Authorization (EUA). This EUA will remain  in effect (meaning this test can be used) for the duration of the COVID-19 declaration under Section 564(b)(1) of the Act, 21 U.S.C.section 360bbb-3(b)(1), unless the authorization is terminated  or revoked sooner.       Influenza A by PCR NEGATIVE NEGATIVE Final   Influenza B by PCR NEGATIVE NEGATIVE Final    Comment: (NOTE) The Xpert Xpress SARS-CoV-2/FLU/RSV plus assay is intended as an aid in the diagnosis of influenza from Nasopharyngeal swab specimens and should not be used as a sole basis for treatment. Nasal washings and aspirates are unacceptable for Xpert Xpress SARS-CoV-2/FLU/RSV testing.  Fact Sheet for Patients: EntrepreneurPulse.com.au  Fact Sheet for Healthcare Providers: IncredibleEmployment.be  This test is not yet approved or cleared by the [place] FDA and has been authorized for detection and/or diagnosis of SARS-CoV-2 by FDA under an Emergency Use Authorization (EUA). This EUA will remain in effect (meaning this test can be used) for the duration of the COVID-19 declaration under Section 564(b)(1) of the Act, 21 U.S.C. section 360bbb-3(b)(1), unless the authorization is terminated or revoked.  Performed at [facility], [place] [address] [place], [place] [address]           Radiology Studies: DG Abd Portable 1V  Result Date: 10/22/2020 CLINICAL DATA:  Encounter for NG tube EXAM: PORTABLE ABDOMEN - 1 VIEW COMPARISON:  None. FINDINGS: There is a feeding tube with tip overlying the distal stomach. There are some prominent loops of small bowel but there is no evidence of obstruction. Multilevel degenerative changes of the spine. IMPRESSION: Feeding tube tip  overlies the region of the distal stomach. Electronically Signed   By: Maurine Simmering M.D.   On: 10/22/2020 13:00        Scheduled Meds:  amLODipine  10 mg Per Tube Daily   atorvastatin  40 mg Per Tube Daily   enoxaparin (LOVENOX) injection  90 mg Subcutaneous Q12H   feeding supplement (PROSource TF)  45 mL Per Tube BID   finasteride  5 mg Oral Q0600   free water  175 mL Per Tube Q4H   insulin aspart  0-9 Units Subcutaneous Q4H   levETIRAcetam  1,000 mg Per Tube BID   mouth rinse  15 mL Mouth Rinse BID   psyllium  1 packet Oral Daily   Continuous Infusions:  feeding supplement (OSMOLITE 1.5 CAL) 55 mL/hr at 10/23/20 0451     LOS: 9 days  Time spent: 25 mins.More than 50% of that time was spent in counseling and/or coordination of care.      Bonnell Public, MD [facility] P8/20/2022, 11:42 AM

## 2020-10-23 NOTE — Plan of Care (Signed)
  Problem: Clinical Measurements: Goal: Will remain free from infection Outcome: Progressing Goal: Diagnostic test results will improve Outcome: Progressing Goal: Respiratory complications will improve Outcome: Progressing   Problem: Activity: Goal: Risk for activity intolerance will decrease Outcome: Progressing   

## 2020-10-24 DIAGNOSIS — R531 Weakness: Secondary | ICD-10-CM | POA: Diagnosis not present

## 2020-10-24 LAB — GLUCOSE, CAPILLARY
Glucose-Capillary: 113 mg/dL — ABNORMAL HIGH (ref 70–99)
Glucose-Capillary: 116 mg/dL — ABNORMAL HIGH (ref 70–99)
Glucose-Capillary: 135 mg/dL — ABNORMAL HIGH (ref 70–99)
Glucose-Capillary: 119 mg/dL — ABNORMAL HIGH (ref 70–99)
Glucose-Capillary: 122 mg/dL — ABNORMAL HIGH (ref 70–99)
Glucose-Capillary: 140 mg/dL — ABNORMAL HIGH (ref 70–99)

## 2020-10-24 MED ORDER — NUTRISOURCE FIBER PO PACK
1.0000 | PACK | Freq: Every day | ORAL | Status: DC
  Administered 2020-10-24 – 2020-11-03 (×10): 1
  Filled 2020-10-24 (×11): qty 1

## 2020-10-24 NOTE — Plan of Care (Signed)
  Problem: Nutrition: Goal: Risk of aspiration will decrease Outcome: Progressing Goal: Dietary intake will improve Outcome: Progressing   Problem: Intracerebral Hemorrhage Tissue Perfusion: Goal: Complications of Intracerebral Hemorrhage will be minimized Outcome: Progressing   Problem: Ischemic Stroke/TIA Tissue Perfusion: Goal: Complications of ischemic stroke/TIA will be minimized Outcome: Progressing   Problem: Nutrition: Goal: Risk of aspiration will decrease Outcome: Progressing Goal: Dietary intake will improve Outcome: Progressing   Problem: Intracerebral Hemorrhage Tissue Perfusion: Goal: Complications of Intracerebral Hemorrhage will be minimized Outcome: Progressing   Problem: Ischemic Stroke/TIA Tissue Perfusion: Goal: Complications of ischemic stroke/TIA will be minimized Outcome: Progressing

## 2020-10-24 NOTE — Progress Notes (Signed)
PROGRESS NOTE    Clifford [name]  [id number] DOB: [birth year] DOA: 10/14/2020 PCP: Pcp, No   Chief Complain:AMS  Brief Narrative:  Patient is a 70 year old male with history of diastolic congestive heart failure, hypertension, hyperlipidemia, PE on anticoagulation, recent right MCA/PCA watershed ischemic stroke who presented with increasing left upper arm weakness, left visual defect.  MRI done on presentation showed evolving/increased size of stroke, petechial hemorrhages, cytotoxic edema, concern for new seizures.  Neurology was consulted and were following.  In the early morning of 10/19/2020, patient was noticed to have increased left upper extremity weakness.  CT head follow-up showed evolving right posterior MCA territory infarct with increased cytotoxic edema, mass-effect.  Neurology re-consulted.Eventual plan is to discharge to SnF as per PT/OT  10/22/2020: Patient seen alongside 2 daughters.  No new changes.  Patient is awaiting disposition. 10/23/2020: Patient seen.  Patient remains somnolent.  No significant history from patient.  Left-sided hemiplegia noted.  Dysphagia also listed.  No significant history for appreciated.  Awaiting disposition. 10/24/2020: Patient seen alongside patient's daughter and son.  No new changes.  Assessment & Plan:   Principal Problem:   Left-sided weakness Active Problems:   Stroke [facility])   Essential hypertension   Mixed hyperlipidemia   Prediabetes   History of pulmonary embolism   Chronic anticoagulation   Nicotine dependence, cigarettes, uncomplicated   Petechial hemorrhage   Acute stroke due to ischemia [facility])   Left-sided weakness: Secondary to worsening right parietal occipital MCA/PCA watershed stroke complicated by worsening petechial hemorrhage.  MRI of the brain reviewed.  Pradaxa was held now restarted.  Repeat CT head on 8/12 did not show any apparent change.  Neurology were following.  His left-sided weakness was improving.  In the  early morning of 10/19/2020, patient was noticed to have increased left upper extremity weakness.  CT head follow-up showed evolving right posterior MCA territory infarct with increased cytotoxic edema, mass-effect.  Neurology re-consulted.  Now he has dense hemiplegia on the left side Eventual plan is to discharge to SNF as per PT/OT   Concern for seizure: Likely precipitated in the setting of worsening stroke with petechial hemorrhage.  EEG showed cortical dysfunction , no epileptiform discharges.  Currently on Keppra.  Hypertension: Continue monitoring blood pressure.   On hydrochlorothiazide and losartan at home.  Resumed  AKI: Most likely secondary to poor oral intake.  Will start on gentle IV fluids.  Sinus bradycardia: Asymptomatic.  Continue to monitor.  Hyperlipidemia: On Lipitor.  Recent LDL of 75.  Prediabetes: Monitor blood sugars.  Carbohydrate consistent diet.  History of multiple pulmonary embolism: On lifelong anticoagulation.  Takes Pradaxa at home which was held due to finding of petechial hemorrhage,now restarted  Tobacco abuse: Counseled cessation.  Declined nicotine patch  Morbid obesity: BMI of 35.1.  We recommend weight loss, healthy lifestyle, PCP follow-up    Nutrition Problem: Inadequate oral intake Etiology: dysphagia      DVT prophylaxis:Pradaxa Code Status: Full Family Communication: Discussed with daughter and brother at bedside on 10/19/2020  Remains inpatient appropriate because:Unsafe d/c plan  Dispo: The patient is from: Home              Anticipated d/c is to: SNF              Patient currently is medically stable for dc   Difficult to place patient No     Consultants: neurology  Procedures:EEG  Antimicrobials:  Anti-infectives (From admission, onward)    None  Subjective: No significant history from patient.   Patient is more awake and interactive today.  Objective: Vitals:   10/24/20 0800 10/24/20 1053 10/24/20 1100  10/24/20 1459  BP: (!) 154/72 (!) 163/76 (!) 154/70 133/61  Pulse:  60  (!) 55  Resp:  18  18  Temp:  98 F (36.7 C)  98.2 F (36.8 C)  TempSrc:      SpO2:  100%  100%  Weight:      Height:        Intake/Output Summary (Last 24 hours) at 10/24/2020 1613 Last data filed at 10/24/2020 0511 Gross per 24 hour  Intake 1630 ml  Output --  Net 1630 ml    Filed Weights   10/22/20 0500 10/23/20 0451 10/24/20 0509  Weight: 93 kg 88.5 kg 87.3 kg    Examination:  General exam: Patient awake and interactive today.  Not in any distress.     HEENT: PERRL Respiratory system:  no wheezes or crackles  Cardiovascular system: S1 & S2 heard, RRR.  Gastrointestinal system: Abdomen is nondistended, soft and nontender. Central nervous system: Somnolent.  Dysphasic.  Left-sided hemiplegia.   Extremities: No edema, no clubbing ,no cyanosis  Data Reviewed: I have personally reviewed following labs and imaging studies  CBC: No results for input(s): WBC, NEUTROABS, HGB, HCT, MCV, PLT in the last 168 hours.  Basic Metabolic Panel: Recent Labs  Lab 10/20/20 0142 10/21/20 0300 10/22/20 0509 10/22/20 1719 10/23/20 0437 10/23/20 1645  NA 135 136 137  --   --   --   K 3.8 4.1 4.1  --   --   --   CL 103 101 105  --   --   --   CO2 '24 23 23  '$ --   --   --   GLUCOSE 118* 113* 103*  --   --   --   BUN '18 19 19  '$ --   --   --   CREATININE 1.40* 1.43* 1.31*  --   --   --   CALCIUM 8.8* 9.2 8.9  --   --   --   MG  --   --  2.1 2.2 2.3 2.2  PHOS  --   --  3.4 3.0 3.0 2.7    GFR: Estimated Creatinine Clearance: 52.5 mL/min (A) (by C-G formula based on SCr of 1.31 mg/dL (H)). Liver Function Tests: No results for input(s): AST, ALT, ALKPHOS, BILITOT, PROT, ALBUMIN in the last 168 hours.  No results for input(s): LIPASE, AMYLASE in the last 168 hours. No results for input(s): AMMONIA in the last 168 hours. Coagulation Profile: No results for input(s): INR, PROTIME in the last 168 hours.  Cardiac  Enzymes: No results for input(s): CKTOTAL, CKMB, CKMBINDEX, TROPONINI in the last 168 hours. BNP (last 3 results) No results for input(s): PROBNP in the last 8760 hours. HbA1C: No results for input(s): HGBA1C in the last 72 hours. CBG: Recent Labs  Lab 10/24/20 0109 10/24/20 0317 10/24/20 0821 10/24/20 1140 10/24/20 1607  GLUCAP 113* 116* 135* 119* 122*    Lipid Profile: No results for input(s): CHOL, HDL, LDLCALC, TRIG, CHOLHDL, LDLDIRECT in the last 72 hours. Thyroid Function Tests: No results for input(s): TSH, T4TOTAL, FREET4, T3FREE, THYROIDAB in the last 72 hours. Anemia Panel: No results for input(s): VITAMINB12, FOLATE, FERRITIN, TIBC, IRON, RETICCTPCT in the last 72 hours. Sepsis Labs: No results for input(s): PROCALCITON, LATICACIDVEN in the last 168 hours.  Recent Results (from the past  240 hour(s))  Resp Panel by RT-PCR (Flu A&B, Covid) Nasopharyngeal Swab     Status: None   Collection Time: 10/14/20  7:06 PM   Specimen: Nasopharyngeal Swab; Nasopharyngeal(NP) swabs in vial transport medium  Result Value Ref Range Status   SARS Coronavirus 2 by RT PCR NEGATIVE NEGATIVE Final    Comment: (NOTE) SARS-CoV-2 target nucleic acids are NOT DETECTED.  The SARS-CoV-2 RNA is generally detectable in upper respiratory specimens during the acute phase of infection. The lowest concentration of SARS-CoV-2 viral copies this assay can detect is 138 copies/mL. A negative result does not preclude SARS-Cov-2 infection and should not be used as the sole basis for treatment or other patient management decisions. A negative result may occur with  improper specimen collection/handling, submission of specimen other than nasopharyngeal swab, presence of viral mutation(s) within the areas targeted by this assay, and inadequate number of viral copies(<138 copies/mL). A negative result must be combined with clinical observations, patient history, and epidemiological information. The  expected result is Negative.  Fact Sheet for Patients:  EntrepreneurPulse.com.au  Fact Sheet for Healthcare Providers:  IncredibleEmployment.be  This test is no t yet approved or cleared by the [place] FDA and  has been authorized for detection and/or diagnosis of SARS-CoV-2 by FDA under an Emergency Use Authorization (EUA). This EUA will remain  in effect (meaning this test can be used) for the duration of the COVID-19 declaration under Section 564(b)(1) of the Act, 21 U.S.C.section 360bbb-3(b)(1), unless the authorization is terminated  or revoked sooner.       Influenza A by PCR NEGATIVE NEGATIVE Final   Influenza B by PCR NEGATIVE NEGATIVE Final    Comment: (NOTE) The Xpert Xpress SARS-CoV-2/FLU/RSV plus assay is intended as an aid in the diagnosis of influenza from Nasopharyngeal swab specimens and should not be used as a sole basis for treatment. Nasal washings and aspirates are unacceptable for Xpert Xpress SARS-CoV-2/FLU/RSV testing.  Fact Sheet for Patients: EntrepreneurPulse.com.au  Fact Sheet for Healthcare Providers: IncredibleEmployment.be  This test is not yet approved or cleared by the [place] FDA and has been authorized for detection and/or diagnosis of SARS-CoV-2 by FDA under an Emergency Use Authorization (EUA). This EUA will remain in effect (meaning this test can be used) for the duration of the COVID-19 declaration under Section 564(b)(1) of the Act, 21 U.S.C. section 360bbb-3(b)(1), unless the authorization is terminated or revoked.  Performed at [facility], [place] [address] [place], [place] [address]           Radiology Studies: No results found.      Scheduled Meds:  amLODipine  10 mg Per Tube Daily   atorvastatin  40 mg Per Tube Daily   enoxaparin (LOVENOX) injection  90 mg Subcutaneous Q12H   feeding supplement (PROSource TF)  45 mL Per Tube  BID   fiber  1 packet Per Tube Daily   free water  175 mL Per Tube Q4H   insulin aspart  0-9 Units Subcutaneous Q4H   levETIRAcetam  1,000 mg Per Tube BID   mouth rinse  15 mL Mouth Rinse BID   Continuous Infusions:  feeding supplement (OSMOLITE 1.5 CAL) 1,000 mL (10/24/20 0624)     LOS: 10 days    Time spent: 25 mins.More than 50% of that time was spent in counseling and/or coordination of care.      Bonnell Public, MD [facility] P8/21/2022, 4:13 PM

## 2020-10-24 NOTE — Plan of Care (Signed)
  Problem: Education: Goal: Knowledge of disease or condition will improve Outcome: Progressing Goal: Knowledge of secondary prevention will improve Outcome: Progressing Goal: Knowledge of patient specific risk factors addressed and post discharge goals established will improve Outcome: Progressing Goal: Individualized Educational Video(s) Outcome: Progressing   Problem: Coping: Goal: Will verbalize positive feelings about self Outcome: Progressing

## 2020-10-25 DIAGNOSIS — R531 Weakness: Secondary | ICD-10-CM | POA: Diagnosis not present

## 2020-10-25 LAB — CBC
HCT: 41.1 % (ref 39.0–52.0)
Hemoglobin: 13.1 g/dL (ref 13.0–17.0)
MCH: 25.9 pg — ABNORMAL LOW (ref 26.0–34.0)
MCHC: 31.9 g/dL (ref 30.0–36.0)
MCV: 81.2 fL (ref 80.0–100.0)
Platelets: 178 10*3/uL (ref 150–400)
RBC: 5.06 MIL/uL (ref 4.22–5.81)
RDW: 14.6 % (ref 11.5–15.5)
WBC: 5.7 10*3/uL (ref 4.0–10.5)
nRBC: 0 % (ref 0.0–0.2)

## 2020-10-25 LAB — BASIC METABOLIC PANEL
Anion gap: 10 (ref 5–15)
BUN: 30 mg/dL — ABNORMAL HIGH (ref 8–23)
CO2: 20 mmol/L — ABNORMAL LOW (ref 22–32)
Calcium: 8.8 mg/dL — ABNORMAL LOW (ref 8.9–10.3)
Chloride: 105 mmol/L (ref 98–111)
Creatinine, Ser: 1.16 mg/dL (ref 0.61–1.24)
GFR, Estimated: >60 mL/min (ref >60)
Glucose, Bld: 122 mg/dL — ABNORMAL HIGH (ref 70–99)
Potassium: 5.7 mmol/L — ABNORMAL HIGH (ref 3.5–5.1)
Sodium: 135 mmol/L (ref 135–145)

## 2020-10-25 LAB — GLUCOSE, CAPILLARY
Glucose-Capillary: 110 mg/dL — ABNORMAL HIGH (ref 70–99)
Glucose-Capillary: 126 mg/dL — ABNORMAL HIGH (ref 70–99)
Glucose-Capillary: 128 mg/dL — ABNORMAL HIGH (ref 70–99)
Glucose-Capillary: 138 mg/dL — ABNORMAL HIGH (ref 70–99)
Glucose-Capillary: 146 mg/dL — ABNORMAL HIGH (ref 70–99)
Glucose-Capillary: 149 mg/dL — ABNORMAL HIGH (ref 70–99)

## 2020-10-25 MED ORDER — SODIUM ZIRCONIUM CYCLOSILICATE 5 G PO PACK
5.0000 g | PACK | Freq: Every day | ORAL | Status: DC
  Administered 2020-10-25 – 2020-10-26 (×2): 5 g
  Filled 2020-10-25 (×2): qty 1

## 2020-10-25 MED ORDER — PATIROMER SORBITEX CALCIUM 8.4 G PO PACK
8.4000 g | PACK | Freq: Every day | ORAL | Status: DC
  Filled 2020-10-25: qty 1

## 2020-10-25 NOTE — Plan of Care (Signed)

## 2020-10-25 NOTE — Progress Notes (Addendum)
[place] for switch Pradaxa to Lovenox since unable to crush Pradaxa (allergy to Eliquis) Indication: hx pulmonary embolus  Allergies  Allergen Reactions   Apixaban     Other reaction(s): Liver enzymes abnormal   Lisinopril     Other reaction(s): Cough (finding)   Simvastatin     Other reaction(s): Pain in lower limb (finding)    Patient Measurements: Height: '5\' 5"'$  (165.1 cm) Weight: 87 kg (191 lb 12.8 oz) IBW/kg (Calculated) : 61.5  Vital Signs: Temp: 98.9 F (37.2 C) (08/22 0830) Temp Source: Oral (08/22 0830) BP: 151/63 (08/22 0830) Pulse Rate: 51 (08/22 0830)  Labs: Recent Labs    10/25/20 0030  HGB 13.1  HCT 41.1  PLT 178  CREATININE 1.16     Estimated Creatinine Clearance: 59.2 mL/min (by C-G formula based on SCr of 1.16 mg/dL).   Medical History: Past Medical History:  Diagnosis Date   BPH (benign prostatic hyperplasia)    Chronic anticoagulation 10/14/2020   Essential hypertension 10/06/2020   History of pulmonary embolism 10/14/2020   HLD (hyperlipidemia)    HTN (hypertension)    Lung nodule    39m in 2015   [place]    Mixed hyperlipidemia 10/06/2020   Nicotine dependence, cigarettes, uncomplicated [id number]  Peripheral vascular disease ([facility]    Polyp of colon    Prediabetes 10/14/2020   Pulmonary embolus ([place]    Stroke ([place] 10/06/2020    Assessment: 70yo male on chronic Pradaxa for hx PE's, now unable to swallow Pradaxa whole and it cannot be crushed.  Pharmacy asked on 10/22/20 to dose Lovenox in the interim> lovenox '90mg'$  SQ q12hr..  Currently on Lovenox '1mg'$ /kg sq q12hr .  SCr down from 1.43>> to 1.16.  CrCl 59 ml/min CBC is within normal limits.  No bleeding reported.    Diet: NPO, on tube feeding  Weight: 87 kg from 93 kg  Last dose of Pradaxa 8/19 at 1331 PM.  Goal of Therapy:  Monitor platelets by anticoagulation protocol: Yes   Plan:  Continue Lovenox 90 mg SQ  q 12 hrs F/u ability to swallow and switch back to Pradaxa.  Xarelto another option, but I think Pradaxa is preferred agent by [facility].   Thank you for allowing pharmacy to be part of this patients care team. RNicole Cella RIndian WellsPharmacist [phone] 10/25/2020 10:26 AM  [facility] Hospital[facility] phone numbers are listed on [email or web address]

## 2020-10-25 NOTE — Progress Notes (Signed)
[facility] PROGRESS NOTE    Clifford [name]  [id number] DOB: 02-Dec-1950 DOA: 10/14/2020 PCP: Pcp, No      Brief Narrative:  Clifford [name] is a 70 y.o. M with PE on Eliquis, HTN, CHF and recent right MCA/PCA stroke who presented with left upper arm weakness, visual defect.  MRI showed increased size of stroke       Assessment & Plan:  Progression of right parietal occipital watershed stroke complicated by petechial hemorrhage Pradaxa initially held, now restarted, since that cannot be crushed, we are using Lovenox until he is able to swallow -Continue Lovenox - Continue atorvastatin  Hyperkalemia - Lokelma - Trend potassium/BMP  Dysphagia - Continue tube feeds - Speech therapy evaluation  Hypertension Blood pressure controlled - Continue amlodipine  Seizure disorder No seizures observed - Continue Keppra         Disposition: Status is: Inpatient  Remains inpatient appropriate because:IV treatments appropriate due to intensity of illness or inability to take PO  Dispo: The patient is from: Home              Anticipated d/c is to: SNF              Patient currently is not medically stable to d/c.   Difficult to place patient No       Level of care: Progressive       MDM: The below labs and imaging reports were reviewed and summarized above.  Medication management as above.    DVT prophylaxis: SCD's Start: 10/14/20 2158  Code Status: FULL Family Communication:           Subjective: Appears more obtunded today.  No fever.  No respiratory distress.  Objective: Vitals:   10/25/20 1221 10/25/20 1628 10/25/20 1650 10/25/20 2019  BP: (!) 129/52 (!) 147/65  (!) 162/83  Pulse: (!) 56 (!) 52  69  Resp: '19 19  18  '$ Temp: 98 F (36.7 C) 99.7 F (37.6 C) 98.9 F (37.2 C) 98.4 F (36.9 C)  TempSrc: Oral Axillary Oral Oral  SpO2: 99% 100%  100%  Weight:      Height:        Intake/Output Summary (Last 24 hours) at  10/25/2020 2043 Last data filed at 10/25/2020 1800 Gross per 24 hour  Intake 2876 ml  Output --  Net 2876 ml   Filed Weights   10/23/20 0451 10/24/20 0509 10/25/20 0432  Weight: 88.5 kg 87.3 kg 87 kg    Examination: General appearance:  adult male, sleepy, very sluggish  HEENT: Anicteric, conjunctiva pink, lids and lashes normal. No nasal deformity, discharge, epistaxis.  Lips moist, edentulous.   Skin: Warm and dry.    No suspicious rashes or lesions. Cardiac: RRR, nl S1-S2, no murmurs appreciated. No LE edema.  Radial  pulses 2+ and symmetric. Respiratory: Normal respiratory rate and rhythm.  CTAB without rales or wheezes. Abdomen: Abdomen soft.  no TTP. No ascites, distension, hepatosplenomegaly.   MSK: No deformities or effusions. Neuro" Sleepy, very sluggish.  Does not follow commands, no intellgible verbalizations  Psych: unable to asses    Data Reviewed: I have personally reviewed following labs and imaging studies:  CBC: Recent Labs  Lab 10/25/20 0030  WBC 5.7  HGB 13.1  HCT 41.1  MCV 81.2  PLT 0000000   Basic Metabolic Panel: Recent Labs  Lab 10/20/20 0142 10/21/20 0300 10/22/20 0509 10/22/20 1719 10/23/20 0437 10/23/20 1645 10/25/20 0030  NA 135 136 137  --   --   --  135  K 3.8 4.1 4.1  --   --   --  5.7*  CL 103 101 105  --   --   --  105  CO2 '24 23 23  '$ --   --   --  20*  GLUCOSE 118* 113* 103*  --   --   --  122*  BUN '18 19 19  '$ --   --   --  30*  CREATININE 1.40* 1.43* 1.31*  --   --   --  1.16  CALCIUM 8.8* 9.2 8.9  --   --   --  8.8*  MG  --   --  2.1 2.2 2.3 2.2  --   PHOS  --   --  3.4 3.0 3.0 2.7  --    GFR: Estimated Creatinine Clearance: 59.2 mL/min (by C-G formula based on SCr of 1.16 mg/dL). Liver Function Tests: No results for input(s): AST, ALT, ALKPHOS, BILITOT, PROT, ALBUMIN in the last 168 hours. No results for input(s): LIPASE, AMYLASE in the last 168 hours. No results for input(s): AMMONIA in the last 168 hours. Coagulation  Profile: No results for input(s): INR, PROTIME in the last 168 hours. Cardiac Enzymes: No results for input(s): CKTOTAL, CKMB, CKMBINDEX, TROPONINI in the last 168 hours. BNP (last 3 results) No results for input(s): PROBNP in the last 8760 hours. HbA1C: No results for input(s): HGBA1C in the last 72 hours. CBG: Recent Labs  Lab 10/25/20 0403 10/25/20 0834 10/25/20 1224 10/25/20 1631 10/25/20 2020  GLUCAP 138* 110* 128* 126* 149*   Lipid Profile: No results for input(s): CHOL, HDL, LDLCALC, TRIG, CHOLHDL, LDLDIRECT in the last 72 hours. Thyroid Function Tests: No results for input(s): TSH, T4TOTAL, FREET4, T3FREE, THYROIDAB in the last 72 hours. Anemia Panel: No results for input(s): VITAMINB12, FOLATE, FERRITIN, TIBC, IRON, RETICCTPCT in the last 72 hours. Urine analysis:    Component Value Date/Time   COLORURINE YELLOW 10/06/2020 [place] 10/06/2020 1353   LABSPEC 1.020 10/06/2020 1353   PHURINE 6.0 10/06/2020 1353   GLUCOSEU NEGATIVE 10/06/2020 1353   HGBUR TRACE (A) 10/06/2020 1353   BILIRUBINUR NEGATIVE 10/06/2020 1353   KETONESUR NEGATIVE 10/06/2020 1353   PROTEINUR NEGATIVE 10/06/2020 1353   NITRITE NEGATIVE 10/06/2020 1353   LEUKOCYTESUR SMALL (A) 10/06/2020 1353   Sepsis Labs: '@LABRCNTIP'$ (procalcitonin:4,lacticacidven:4)  )No results found for this or any previous visit (from the past 240 hour(s)).       Radiology Studies: No results found.      Scheduled Meds:  amLODipine  10 mg Per Tube Daily   atorvastatin  40 mg Per Tube Daily   enoxaparin (LOVENOX) injection  90 mg Subcutaneous Q12H   feeding supplement (PROSource TF)  45 mL Per Tube BID   fiber  1 packet Per Tube Daily   free water  175 mL Per Tube Q4H   insulin aspart  0-9 Units Subcutaneous Q4H   levETIRAcetam  1,000 mg Per Tube BID   mouth rinse  15 mL Mouth Rinse BID   sodium zirconium cyclosilicate  5 g Per Tube Daily   Continuous Infusions:  feeding supplement  (OSMOLITE 1.5 CAL) 55 mL/hr at 10/25/20 1800     LOS: 11 days    Time spent: 35 minutes    Edwin Dada, MD [facility] 10/25/2020, 8:43 PM     Please page though [place] or Epic secure chat:  For [employer], Adult nurse

## 2020-10-25 NOTE — Progress Notes (Signed)
  Speech Language Pathology Treatment: Dysphagia;Cognitive-Linquistic  Patient Details Name: Clifford [name] MRN: [id number] DOB: 03/29/1950 Today's Date: 10/25/2020 Time: [id number] SLP Time Calculation (min) (ACUTE ONLY): 39 min  Assessment / Plan / Recommendation Clinical Impression  Pt intermittently opened his eyes this afternoon but had near continuous loss of saliva out of the L side of his mouth, which he made no attempts to manage. SLP provided oral suction to retrieve additional saliva that pooled in his mouth, but it would quickly build back up again. SLP also provided additional cueing and stimulation to try to maximize alertness. He attempted to follow commands intermittently, such as opening his mouth to command, but did not within the context of swallowing. Still, with presentation of POs by spoon, pt made no attempts to obtain boluses, instead allowing saliva to just mix with it. Education was provided to his dtr and brother including the importance of oral care, results of recent MBS, current recommendations, and ways to stimulate him cognitively. SLP will continue to follow.    HPI HPI: 70yo male admitted 10/14/20 with right side headache and LUE weakness. 10/19/20 pt was noted to have increased LUE weakness. 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 CTHead = evolving RMCA infarct with cytotoxic edema, mass effect      SLP Plan  Continue with current plan of care       Recommendations  Diet recommendations: NPO Medication Administration: Via alternative means                Oral Care Recommendations: Oral care QID Follow up Recommendations: Skilled Nursing facility SLP Visit Diagnosis: Dysphagia, oropharyngeal phase (R13.12) Plan: Continue with current plan of care       GO                Osie Bond., M.A. [facility] Pager [phone] Office  [phone]  10/25/2020, 3:43 PM

## 2020-10-25 NOTE — Progress Notes (Signed)
PT Cancellation Note  Patient Details Name: Clifford [name] MRN: [id number] DOB: 30-Sep-1950   Cancelled Treatment:    Reason Eval/Treat Not Completed: Fatigue/lethargy limiting ability to participate;Medical issues which prohibited therapy. Pt lethargic with decreased participation. Pt's K+ 5.7 today. Hold per RN request. Acute PT to continue and follow up another date.  Willow Ora, PTA, [place] [phone] 10/25/20, 11:06 AM    Willow Ora 10/25/2020, 11:05 AM

## 2020-10-25 NOTE — Plan of Care (Signed)

## 2020-10-25 NOTE — Progress Notes (Addendum)
OT Cancellation Note  Patient Details Name: Clifford [name] MRN: [id number] DOB: 10/10/1950   Cancelled Treatment:    Reason Eval/Treat Not Completed: Fatigue/lethargy limiting ability to participate;Medical issues which prohibited therapy. K at 5.7 and pt lethargic. RN request hold. Will return as schedule allows.   [place], OTR/L Acute Rehab Pager: [phone] Office: [phone] 10/25/2020, 10:31 AM

## 2020-10-25 NOTE — Plan of Care (Signed)
  Problem: Education: Goal: Knowledge of disease or condition will improve Outcome: Progressing Goal: Knowledge of secondary prevention will improve Outcome: Progressing Goal: Knowledge of patient specific risk factors addressed and post discharge goals established will improve Outcome: Progressing Goal: Individualized Educational Video(s) Outcome: Progressing   Problem: Coping: Goal: Will verbalize positive feelings about self Outcome: Progressing Goal: Will identify appropriate support needs Outcome: Progressing   Problem: Health Behavior/Discharge Planning: Goal: Ability to manage health-related needs will improve Outcome: Progressing   Problem: Self-Care: Goal: Ability to participate in self-care as condition permits will improve Outcome: Progressing

## 2020-10-26 DIAGNOSIS — Z515 Encounter for palliative care: Secondary | ICD-10-CM | POA: Diagnosis not present

## 2020-10-26 DIAGNOSIS — R531 Weakness: Secondary | ICD-10-CM | POA: Diagnosis not present

## 2020-10-26 DIAGNOSIS — Z7189 Other specified counseling: Secondary | ICD-10-CM | POA: Diagnosis not present

## 2020-10-26 DIAGNOSIS — I639 Cerebral infarction, unspecified: Secondary | ICD-10-CM | POA: Diagnosis not present

## 2020-10-26 LAB — BASIC METABOLIC PANEL
Anion gap: 8 (ref 5–15)
BUN: 33 mg/dL — ABNORMAL HIGH (ref 8–23)
CO2: 23 mmol/L (ref 22–32)
Calcium: 9 mg/dL (ref 8.9–10.3)
Chloride: 105 mmol/L (ref 98–111)
Creatinine, Ser: 1.24 mg/dL (ref 0.61–1.24)
GFR, Estimated: >60 mL/min (ref >60)
Glucose, Bld: 112 mg/dL — ABNORMAL HIGH (ref 70–99)
Potassium: 4.3 mmol/L (ref 3.5–5.1)
Sodium: 136 mmol/L (ref 135–145)

## 2020-10-26 LAB — GLUCOSE, CAPILLARY
Glucose-Capillary: 111 mg/dL — ABNORMAL HIGH (ref 70–99)
Glucose-Capillary: 123 mg/dL — ABNORMAL HIGH (ref 70–99)
Glucose-Capillary: 124 mg/dL — ABNORMAL HIGH (ref 70–99)
Glucose-Capillary: 130 mg/dL — ABNORMAL HIGH (ref 70–99)
Glucose-Capillary: 136 mg/dL — ABNORMAL HIGH (ref 70–99)
Glucose-Capillary: 137 mg/dL — ABNORMAL HIGH (ref 70–99)
Glucose-Capillary: 146 mg/dL — ABNORMAL HIGH (ref 70–99)

## 2020-10-26 LAB — CBC
HCT: 38.6 % — ABNORMAL LOW (ref 39.0–52.0)
Hemoglobin: 12.1 g/dL — ABNORMAL LOW (ref 13.0–17.0)
MCH: 25.7 pg — ABNORMAL LOW (ref 26.0–34.0)
MCHC: 31.3 g/dL (ref 30.0–36.0)
MCV: 82 fL (ref 80.0–100.0)
Platelets: 145 10*3/uL — ABNORMAL LOW (ref 150–400)
RBC: 4.71 MIL/uL (ref 4.22–5.81)
RDW: 14.6 % (ref 11.5–15.5)
WBC: 5.8 10*3/uL (ref 4.0–10.5)
nRBC: 0 % (ref 0.0–0.2)

## 2020-10-26 NOTE — Consult Note (Signed)
Consultation Note Date: 10/26/2020   Patient Name: Clifford [name]  DOB: 1950-11-03  MRN: [id number]  Age / Sex: 70 y.o., male  PCP: Pcp, No Referring Physician: Edwin Dada, *  Reason for Consultation: Establishing goals of care  HPI/Patient Profile: 70 y.o. 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 Seemed to be making some progress at beginning of hospitalization followed by increased decline in status last few days more concerning for overall prognosis.   Clinical Assessment and Goals of Care: I met today with Joanne' family (son, brother, daughter-in-law and daughter via telephone) along with Dr. Loleta Books. Dr. Loleta Books explained to family medical course and expectations. We discussed pathways moving forward with feeding tube and debility requiring full care vs moving more towards comfort focused care. Acknowledging that either pathway is a very difficult road for Clifford [name] we are asking his family to make very difficult decisions. We asked the to consider Clifford [name] and his values and personality and what is most important to him in life. We ask family to be Clifford [name]' voice at this time to help [place] identify what it is he would want for himself. Family are clear that they are not ready "to give up." However, his children are very clear that he would never want to live in a nursing home. Family are understandably tearful and overwhelmed with grief. They will need time to process this information and speak more with each other. There is a sister coming in from out of town soon  as well. We agree to take time to process as no decisions need to be made today. We will continue to speak and support family through this process.   Of note, family describes Clifford [name] as a family man and he clearly has many family members who love him very much. He has 1 son and 2 daughters (1 is a step daughter but he claims as his own). He is noted to be an excellent cook who can cook anything and all dishes to perfection without a recipe. 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 Family smile as they share about Clifford [name].   All questions/concerns addressed. Emotional support provided.   Primary Decision Maker NEXT OF KIN adult children    SUMMARY OF RECOMMENDATIONS   - No decisions made today - Preliminary discussion regarding poor prognosis and different pathways moving forward  Code Status/Advance Care Planning: Full code   Symptom Management:  Per attending.   Palliative Prophylaxis:  Aspiration, Bowel Regimen, Delirium Protocol, Frequent Pain Assessment, Oral Care, and Turn Reposition  Psycho-social/Spiritual:  Desire for further Chaplaincy support:no Additional Recommendations: Grief/Bereavement Support  Prognosis:  Depending on decisions moving forward. If comfort focused care he would be eligible for  hospice facility with prognosis < 2 weeks. If artificial feeding desired prognosis may be longer but more difficult to determine.   Discharge Planning: To Be Determined      Primary Diagnoses: Present on Admission:  Stroke [facility])  Essential hypertension  Mixed hyperlipidemia  Prediabetes  History of pulmonary embolism  Nicotine dependence, cigarettes, uncomplicated  Petechial hemorrhage  Acute stroke due to ischemia [facility])   I have reviewed the medical record, interviewed the patient and family, and examined the patient. The following aspects are pertinent.  Past Medical History:  Diagnosis Date   BPH (benign prostatic hyperplasia)     Chronic anticoagulation 10/14/2020   Essential hypertension 10/06/2020   History of pulmonary embolism 10/14/2020   HLD (hyperlipidemia)    HTN (hypertension)    Lung nodule    29m in 2015   Malignant neoplasm of left kidney (H[facility])    Mixed hyperlipidemia 10/06/2020   Nicotine dependence, cigarettes, uncomplicated 83/[id number]/4259  Peripheral vascular disease ([place]    Polyp of colon    Prediabetes 10/14/2020   Pulmonary embolus ([place]    Stroke ([place] 10/06/2020   Social History   Socioeconomic History   Marital status: Single    Spouse name: Not on file   Number of children: Not on file   Years of education: Not on file   Highest education level: Not on file  Occupational History   Not on file  Tobacco Use   Smoking status: Every Day    Packs/day: 0.50    Types: Cigarettes   Smokeless tobacco: Never  Vaping Use   Vaping Use: Never used  Substance and Sexual Activity   Alcohol use: Never   Drug use: Never   Sexual activity: Not on file  Other Topics Concern   Not on file  Social History Narrative   Not on file   Social Determinants of Health   Financial Resource Strain: Not on file  Food Insecurity: Not on file  Transportation Needs: Not on file  Physical Activity: Not on file  Stress: Not on file  Social Connections: Not on file   Family History  Problem Relation Age of Onset   Clotting disorder Neg Hx    Scheduled Meds:  amLODipine  10 mg Per Tube Daily   atorvastatin  40 mg Per Tube Daily   enoxaparin (LOVENOX) injection  90 mg Subcutaneous Q12H   feeding supplement (PROSource TF)  45 mL Per Tube BID   fiber  1 packet Per Tube Daily   free water  175 mL Per Tube Q4H   insulin aspart  0-9 Units Subcutaneous Q4H   levETIRAcetam  1,000 mg Per Tube BID   mouth rinse  15 mL Mouth Rinse BID   sodium zirconium cyclosilicate  5 g Per Tube Daily   Continuous Infusions:  feeding supplement (OSMOLITE 1.5 CAL) 1,000 mL (10/25/20 2058)   PRN Meds:.acetaminophen **OR**  acetaminophen (TYLENOL) oral liquid 160 mg/5 mL **OR** acetaminophen, butalbital-acetaminophen-caffeine, labetalol, ondansetron (ZOFRAN) IV, polyethylene glycol Allergies  Allergen Reactions   Apixaban     Other reaction(s): Liver enzymes abnormal   Lisinopril     Other reaction(s): Cough (finding)   Simvastatin     Other reaction(s): Pain in lower limb (finding)   Review of Systems  Unable to perform ROS: Patient nonverbal   Physical Exam Constitutional:      General: He is not in acute distress.    Appearance: He is ill-appearing.  HENT:     Head:  Comments: Cotrak in place Cardiovascular:     Rate and Rhythm: Normal rate.  Pulmonary:     Effort: No tachypnea, accessory muscle usage or respiratory distress.  Abdominal:     General: Abdomen is flat.     Palpations: Abdomen is soft.  Neurological:     Mental Status: He is alert.     Comments: Nonverbal, nods head, unable to assess orientation    Vital Signs: BP 130/64 (BP Location: Left Arm)   Pulse 69   Temp (!) 97.5 F (36.4 C) (Oral)   Resp 20   Ht '5\' 5"'  (1.651 m)   Wt 90.3 kg   SpO2 100%   BMI 33.13 kg/m  Pain Scale: PAINAD   Pain Score: 0-No pain   SpO2: SpO2: 100 % O2 Device:SpO2: 100 % O2 Flow Rate: .   IO: Intake/output summary:  Intake/Output Summary (Last 24 hours) at 10/26/2020 1348 Last data filed at 10/26/2020 4068 Gross per 24 hour  Intake 3179.83 ml  Output --  Net 3179.83 ml    LBM: Last BM Date: 10/24/20 Baseline Weight: Weight: 95.7 kg Most recent weight: Weight: 90.3 kg     Palliative Assessment/Data:     Time Total: 50 min Greater than 50%  of this time was spent counseling and coordinating care related to the above assessment and plan.  Signed by: Vinie Sill, NP Palliative Medicine Team Pager # [phone] (M-F 8a-5p) Team Phone # [phone] (Nights/Weekends)

## 2020-10-26 NOTE — Progress Notes (Addendum)
[facility] PROGRESS NOTE    Clifford [name]  [id number] DOB: 1950-05-01 DOA: 10/14/2020 PCP: Pcp, No      Brief Narrative:  Clifford [name] is a 70 y.o. M with PE on Eliquis, HTN, CHF and recent right MCA/PCA stroke who presented with left upper arm weakness, visual defect.  Found to have enlargement of his previous stroke with hemorrhagic conversion.  Initially relatively mild deficits, Pradaxa held, admitted for SNF placement.    8/13 patient had worsening left sided hemiparesis and decreased mentation.  Serial head CT showed expected evolution of the stroke, and enlargement, but no herniation or substantial midline shift.  Neurology followed serially and recommended continued supportive care.        Assessment & Plan:  Progression of right parietal occipital watershed stroke complicated by petechial hemorrhage Pradaxa initially held, now restarted, since that cannot be crushed, we are using Lovenox until he is able to swallow.  Given his poor mentation, and functional decline over the last 10 days, his rehabilitation will be difficult, but Neurology feel that it is still possible.    - We will discuss goals of care with family - Continue Lovenox - Continue atorvastatin    Hyperkalemia Resolved with Lokelma - Repeat BMP  Dysphagia - Continue tube feeds - Speech therapy evaluation  Hypertension Blood pressure controlled - Continue amlodipine  Seizure disorder No seizures observed - Continue Keppra         Disposition: Status is: Inpatient  Remains inpatient appropriate because:IV treatments appropriate due to intensity of illness or inability to take PO  Dispo: The patient is from: Home              Anticipated d/c is to: SNF              Patient currently is not medically stable to d/c.   Difficult to place patient No       Level of care: Progressive       MDM: The below labs and imaging reports were reviewed and summarized  above.  Medication management as above.    DVT prophylaxis: Enoxaparin  Code Status: FULL Family Communication: Son, daughte,r brother          Subjective: Sleepy.  Opens eyes to family, not to me.  Moves right arm purposefully, but does not follow commands consistently.  Objective: Vitals:   10/26/20 0857 10/26/20 1224 10/26/20 1743 10/26/20 2030  BP: (!) 168/69 130/64 136/66 (!) 150/93  Pulse: 63 69 (!) 57 (!) 59  Resp: '18 20 19 18  '$ Temp: 98.1 F (36.7 C) (!) 97.5 F (36.4 C) 98.6 F (37 C) 98.7 F (37.1 C)  TempSrc: Oral Oral Oral Axillary  SpO2: 99% 100% 100% 100%  Weight:      Height:        Intake/Output Summary (Last 24 hours) at 10/26/2020 2141 Last data filed at 10/26/2020 1811 Gross per 24 hour  Intake 463.83 ml  Output 275 ml  Net 188.83 ml   Filed Weights   10/24/20 0509 10/25/20 0432 10/26/20 0338  Weight: 87.3 kg 87 kg 90.3 kg    Examination: General appearance:  adult male, sleepy, very sluggish  HEENT: Anicteric, conjunctiva pink, lids and lashes normal. No nasal deformity, discharge, epistaxis.  Lips moist, edentulous.   Skin: Warm and dry.    No suspicious rashes or lesions. Cardiac: RRR, nl S1-S2, no murmurs appreciated. No LE edema.  Radial  pulses 2+ and symmetric. Respiratory: Normal respiratory rate  and rhythm.  CTAB without rales or wheezes. Abdomen: Abdomen soft.  no TTP. No ascites, distension, hepatosplenomegaly.   MSK: No deformities or effusions. Neuro" Sleepy, very sluggish.  Does not follow commands, no intellgible verbalizations  Psych: unable to asses    Data Reviewed: I have personally reviewed following labs and imaging studies:  CBC: Recent Labs  Lab 10/25/20 0030 10/26/20 0251  WBC 5.7 5.8  HGB 13.1 12.1*  HCT 41.1 38.6*  MCV 81.2 82.0  PLT 178 Q000111Q*   Basic Metabolic Panel: Recent Labs  Lab 10/20/20 0142 10/21/20 0300 10/22/20 0509 10/22/20 1719 10/23/20 0437 10/23/20 1645 10/25/20 0030 10/26/20 0251   NA 135 136 137  --   --   --  135 136  K 3.8 4.1 4.1  --   --   --  5.7* 4.3  CL 103 101 105  --   --   --  105 105  CO2 '24 23 23  '$ --   --   --  20* 23  GLUCOSE 118* 113* 103*  --   --   --  122* 112*  BUN '18 19 19  '$ --   --   --  30* 33*  CREATININE 1.40* 1.43* 1.31*  --   --   --  1.16 1.24  CALCIUM 8.8* 9.2 8.9  --   --   --  8.8* 9.0  MG  --   --  2.1 2.2 2.3 2.2  --   --   PHOS  --   --  3.4 3.0 3.0 2.7  --   --    GFR: Estimated Creatinine Clearance: 56.4 mL/min (by C-G formula based on SCr of 1.24 mg/dL). Liver Function Tests: No results for input(s): AST, ALT, ALKPHOS, BILITOT, PROT, ALBUMIN in the last 168 hours. No results for input(s): LIPASE, AMYLASE in the last 168 hours. No results for input(s): AMMONIA in the last 168 hours. Coagulation Profile: No results for input(s): INR, PROTIME in the last 168 hours. Cardiac Enzymes: No results for input(s): CKTOTAL, CKMB, CKMBINDEX, TROPONINI in the last 168 hours. BNP (last 3 results) No results for input(s): PROBNP in the last 8760 hours. HbA1C: No results for input(s): HGBA1C in the last 72 hours. CBG: Recent Labs  Lab 10/26/20 0341 10/26/20 0752 10/26/20 1227 10/26/20 1747 10/26/20 2029  GLUCAP 137* 123* 130* 111* 124*   Lipid Profile: No results for input(s): CHOL, HDL, LDLCALC, TRIG, CHOLHDL, LDLDIRECT in the last 72 hours. Thyroid Function Tests: No results for input(s): TSH, T4TOTAL, FREET4, T3FREE, THYROIDAB in the last 72 hours. Anemia Panel: No results for input(s): VITAMINB12, FOLATE, FERRITIN, TIBC, IRON, RETICCTPCT in the last 72 hours. Urine analysis:    Component Value Date/Time   COLORURINE YELLOW 10/06/2020 [place] 10/06/2020 1353   LABSPEC 1.020 10/06/2020 1353   PHURINE 6.0 10/06/2020 1353   GLUCOSEU NEGATIVE 10/06/2020 1353   HGBUR TRACE (A) 10/06/2020 1353   BILIRUBINUR NEGATIVE 10/06/2020 1353   KETONESUR NEGATIVE 10/06/2020 1353   PROTEINUR NEGATIVE 10/06/2020 1353    NITRITE NEGATIVE 10/06/2020 1353   LEUKOCYTESUR SMALL (A) 10/06/2020 1353   Sepsis Labs: '@LABRCNTIP'$ (procalcitonin:4,lacticacidven:4)  )No results found for this or any previous visit (from the past 240 hour(s)).       Radiology Studies: No results found.      Scheduled Meds:  amLODipine  10 mg Per Tube Daily   atorvastatin  40 mg Per Tube Daily   enoxaparin (LOVENOX) injection  90 mg  Subcutaneous Q12H   feeding supplement (PROSource TF)  45 mL Per Tube BID   fiber  1 packet Per Tube Daily   free water  175 mL Per Tube Q4H   insulin aspart  0-9 Units Subcutaneous Q4H   levETIRAcetam  1,000 mg Per Tube BID   mouth rinse  15 mL Mouth Rinse BID   Continuous Infusions:  feeding supplement (OSMOLITE 1.5 CAL) 1,000 mL (10/25/20 2058)     LOS: 12 days    Time spent: 35  minutes    Edwin Dada, MD [facility] 10/26/2020, 9:41 PM     Please page though [place] or Epic secure chat:  For [employer], Adult nurse

## 2020-10-26 NOTE — Progress Notes (Signed)
Physical Therapy Treatment Patient Details Name: Clifford [name] MRN: [id number] DOB: [birth year] Today's Date: 10/26/2020    History of Present Illness 70 yo male presented to ED on 8/11 for R sided headache and worsening L UE weakness. Patient recently admitted 8/3-8/5 for R CVA with L visual deficits and L sided weakness. MRI with mild enlargement of R MCA/border zone infarct since 10/07/20 MRI and increased cytotoxic edema and petechial hemorrhage without midline shift. PMH: HTN, HLD, hx of PE on coumadin, CVA 10/2020    PT Comments    Pt more alert today than last session with this PT. Son and daughter in law present. Pt required tot A to roll to R but assisted L rolling with RUE and RLE and needed max A. Pt sat EOB> 20 mins with 1 sit>stand with max A +2 for power up and to manage L lean. Pt unable to step feet in standing. Pt did not attempt verbalization but did answer finger counting questions correctly with his fingers. PT will continue to follow.    Follow Up Recommendations  SNF;Supervision/Assistance - 24 hour     Equipment Recommendations  Other (comment) (defer to post acute)    Recommendations for Other Services       Precautions / Restrictions Precautions Precautions: Fall Precaution Comments: L visual field deficit, L inattention Restrictions Weight Bearing Restrictions: No Other Position/Activity Restrictions: now with cortrak    Mobility  Bed Mobility Overal bed mobility: Needs Assistance Bed Mobility: Rolling;Supine to Sit;Sit to Supine Rolling: Total assist;Max assist   Supine to sit: Max assist;+2 for physical assistance Sit to supine: Max assist;+2 for physical assistance   General bed mobility comments: Max A to roll to L with some use of R hand on rail and use of RLE to push. Tot A +2 to roll to L. Max A for LE's off bed and elevation of trunk into sitting, pt did pull with his R arm    Transfers Overall transfer level: Needs assistance   Transfers: Sit  to/from Stand Sit to Stand: +2 physical assistance;Max assist         General transfer comment: pt initiated sit>stand when cued, max A +2 for power up and to counter L lean. Pt maintained trunk flexion in standing and L knee buckling  Ambulation/Gait         Gait velocity: decreased   General Gait Details: cued pt to step feet but he was unable to wt shift to do so   Marine scientist Rankin (Stroke Patients Only) Modified Rankin (Stroke Patients Only) Pre-Morbid Rankin Score: Moderately severe disability Modified Rankin: Severe disability     Balance Overall balance assessment: Needs assistance Sitting-balance support: Single extremity supported;Feet supported Sitting balance-Leahy Scale: Poor Sitting balance - Comments: mod A to maintain sitting due to L lean and posterior lean when having periods of increased tone LLE Postural control: Left lateral lean Standing balance support: Bilateral upper extremity supported;During functional activity Standing balance-Leahy Scale: Zero Standing balance comment: max external support to maintain standing                            Cognition Arousal/Alertness: Awake/alert Behavior During Therapy: Flat affect Overall Cognitive Status: Impaired/Different from baseline Area of Impairment: Problem solving;Awareness;Memory;Attention;Following commands;Safety/judgement  Current Attention Level: Sustained Memory: Decreased short-term memory Following Commands: Follows one step commands inconsistently Safety/Judgement: Decreased awareness of safety;Decreased awareness of deficits Awareness: Emergent Problem Solving: Slow processing;Requires verbal cues General Comments: pt following commands with R side when he is physically capable.      Exercises      General Comments General comments (skin integrity, edema, etc.): MD present end of session to speak  with family. Pt did wave with R hand to therapist and brother. Pt with much drooling today in sitting      Pertinent Vitals/Pain Pain Assessment: Faces Faces Pain Scale: No hurt    Home Living                      Prior Function            PT Goals (current goals can now be found in the care plan section) Acute Rehab PT Goals Patient Stated Goal: none stated, pt with minimal verbalization PT Goal Formulation: With patient Time For Goal Achievement: 10/29/20 Potential to Achieve Goals: Fair Progress towards PT goals: Progressing toward goals    Frequency    Min 3X/week      PT Plan Current plan remains appropriate    Co-evaluation              AM-PAC PT "6 Clicks" Mobility   Outcome Measure  Help needed turning from your back to your side while in a flat bed without using bedrails?: Total Help needed moving from lying on your back to sitting on the side of a flat bed without using bedrails?: Total Help needed moving to and from a bed to a chair (including a wheelchair)?: Total Help needed standing up from a chair using your arms (e.g., wheelchair or bedside chair)?: Total Help needed to walk in hospital room?: Total Help needed climbing 3-5 steps with a railing? : Total 6 Click Score: 6    End of Session Equipment Utilized During Treatment: Gait belt Activity Tolerance: Patient tolerated treatment well Patient left: in bed;with family/visitor present;with bed alarm set;with call [place]/phone within reach Nurse Communication: Mobility status (need for bath) PT Visit Diagnosis: Unsteadiness on feet (R26.81);Muscle weakness (generalized) (M62.81);Other symptoms and signs involving the nervous system (R29.898);Other abnormalities of gait and mobility (R26.89)     Time: [id number] PT Time Calculation (min) (ACUTE ONLY): 32 min  Charges:  $Therapeutic Activity: 8-22 mins $Neuromuscular Re-education: 8-22 mins                     Leighton Roach, PT   Acute Rehab Services  Pager [phone] Office [place] 10/26/2020, 5:06 PM

## 2020-10-27 ENCOUNTER — Inpatient Hospital Stay (HOSPITAL_COMMUNITY): Payer: No Typology Code available for payment source

## 2020-10-27 DIAGNOSIS — Z7189 Other specified counseling: Secondary | ICD-10-CM | POA: Diagnosis not present

## 2020-10-27 DIAGNOSIS — Z515 Encounter for palliative care: Secondary | ICD-10-CM | POA: Diagnosis not present

## 2020-10-27 DIAGNOSIS — I639 Cerebral infarction, unspecified: Secondary | ICD-10-CM | POA: Diagnosis not present

## 2020-10-27 LAB — GLUCOSE, CAPILLARY
Glucose-Capillary: 114 mg/dL — ABNORMAL HIGH (ref 70–99)
Glucose-Capillary: 134 mg/dL — ABNORMAL HIGH (ref 70–99)
Glucose-Capillary: 124 mg/dL — ABNORMAL HIGH (ref 70–99)
Glucose-Capillary: 116 mg/dL — ABNORMAL HIGH (ref 70–99)
Glucose-Capillary: 136 mg/dL — ABNORMAL HIGH (ref 70–99)

## 2020-10-27 MED ORDER — LEVETIRACETAM 100 MG/ML PO SOLN
500.0000 mg | Freq: Two times a day (BID) | ORAL | Status: DC
  Administered 2020-10-28 – 2020-11-05 (×15): 500 mg
  Filled 2020-10-27 (×15): qty 5

## 2020-10-27 MED ORDER — LEVETIRACETAM 100 MG/ML PO SOLN: 500.0000 mg | Freq: Two times a day (BID) | ORAL | Status: DC

## 2020-10-27 NOTE — Progress Notes (Addendum)
[facility] PROGRESS NOTE    CHRISTOPH SPAULDING  [id number] DOB: 1951-01-03 DOA: 10/14/2020 PCP: Pcp, No      Brief Narrative:  Mr. Hold is a 70 y.o. 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    In the ER, found to have enlargement of his previous stroke with hemorrhagic conversion.  Initially relatively mild deficits, NIHSS 4.  Pradaxa was held and he was admitted for SNF placement for rehab.     However, the patient started to develop progressively worse left hemiparesis, and more somnolence, particularly on 8/16 (see chart of NIHSS below).  Neurology were reconsulted.  Serial head CTs were obtained on 8/16 that showed expected evolution of stroke, furthering edema, but no herniation or substantial midline shift.  Neurology followed the patient serially and recommended continued supportive care.  An NG was placed as the patient lost the ability to swallow.                  Assessment & Plan:  Progression of right parietal occipital watershed stroke complicated by petechial hemorrhage Pradaxa initially held.  Now >7 days from recurrent event, restarted.  Since that cannot be crushed, we are using Lovenox (an alternative may be Eliquis)  Given his poor mentation, and functional decline, there is an increasing chance that the patient will not regain independence.  Family believe that he would have wished to live, however, and wished to attempt rehabilitation, and that even a dependent lifestyle would be his preference.  They believe he would want PEG placement and rehabilitation to at least be able to eat again.    - Continue atorvastatin     ADDENDUM: Spoke to brother, POA.  They have elected to trial PEG and rehab.  Will consult IR for PEG and TOC for SNF placement.  Will need palliative to follow  at SNF.      Hypertension BP normal - Continue amlodipine  Suspected focal seizures Presented with left hand twitching which was nonsuppressible and clinically suspicious for stroke, and was started on Keppra.   - Continue Keppra, reduce dose as LTM showed no seizures and this activity has stopped  Chronic diastolic CHF  Appears euvolemic.  Not on diuretic at baseline  Hyperkalemia Resolved with Lokelma  Dysphagia - Continue Tube feeds for now  Recurrent VTE, history of - Continue Lovenox for now        Disposition: Status is: Inpatient  Remains inpatient appropriate because:IV treatments appropriate due to intensity of illness or inability to take PO  Dispo: The patient is from: Home              Anticipated d/c is to:  TBD              Patient currently is not medically stable to d/c.   Difficult to place patient No   Patient admitted with worsening stroke symptoms.  Has had progressively worsening mentation and symptoms since hospital day 3, likely due to extension, evolution and microhemorrhage of his initial infarct.  Possibly due to [place].  Reduce Keppra.  Attempt SLP and regain ability to swallow.    We will seek SNF placement and place PEG to prepare.          Level of care: Telemetry Medical       MDM: The below labs and imaging reports were  reviewed and summarized above.  Medication management as above.    DVT prophylaxis: Enoxaparin  Code Status: FULL Family Communication: Brother/POA by phone            Subjective: The patient's exam for me is stable.  Yesterday when family were present, he could lift his right arm against gravity, made some purposeful movements, tried to make eye contact, but was nonverbal, and did not consistently follow commands.         Objective: Vitals:   10/26/20 2351 10/27/20 0456 10/27/20 0853 10/27/20 1237  BP: (!) 154/100 140/78 137/68 125/65  Pulse: (!) 56 69 (!) 58 (!) 58  Resp: '19  19 18 16  '$ Temp: 98.6 F (37 C) 98.9 F (37.2 C) 98.1 F (36.7 C) 98.2 F (36.8 C)  TempSrc: Axillary Oral Oral Oral  SpO2: 100% 99% 99% 97%  Weight:  87.4 kg    Height:        Intake/Output Summary (Last 24 hours) at 10/27/2020 1310 Last data filed at 10/26/2020 1811 Gross per 24 hour  Intake --  Output 275 ml  Net -275 ml   Filed Weights   10/25/20 0432 10/26/20 0338 10/27/20 0456  Weight: 87 kg 90.3 kg 87.4 kg    Examination: General appearance:  Adult male, lying in bed, appears asleep, slumped to the side, does not wake to voice, arm stirs restlessly to touch HEENT: Anicteric.  Pupils equal and reactive, does not make eye contact. Lips normal, edentulous, OP moist.  Does not open mouth fully. Skin: Warm and dry, no suspicious rashes or lesions. Cardiac: RRR, no murmurs, no LE edema.    Respiratory: Normal respirations, but shallow.  No rales or wheezes, although inspiratory effort is low and I do not hear much. Abdomen: Abdomen soft, no grimace to palpation, no masses, no distension. MSK:   Neuro: Sluggish, but opens eyes a little.  Moves right arm slightly, in response to touch. Able to wave to family purposefully yesterday afternoon.  Left facial droop noted, no seizure like activity, pupils equal and reactive, no gaze deviation Psych: Unable to assess    Data Reviewed: I have personally reviewed following labs and imaging studies:  CBC: Recent Labs  Lab 10/25/20 0030 10/26/20 0251  WBC 5.7 5.8  HGB 13.1 12.1*  HCT 41.1 38.6*  MCV 81.2 82.0  PLT 178 Q000111Q*   Basic Metabolic Panel: Recent Labs  Lab 10/21/20 0300 10/22/20 0509 10/22/20 1719 10/23/20 0437 10/23/20 1645 10/25/20 0030 10/26/20 0251  NA 136 137  --   --   --  135 136  K 4.1 4.1  --   --   --  5.7* 4.3  CL 101 105  --   --   --  105 105  CO2 23 23  --   --   --  20* 23  GLUCOSE 113* 103*  --   --   --  122* 112*  BUN 19 19  --   --   --  30* 33*  CREATININE 1.43* 1.31*  --   --   --  1.16  1.24  CALCIUM 9.2 8.9  --   --   --  8.8* 9.0  MG  --  2.1 2.2 2.3 2.2  --   --   PHOS  --  3.4 3.0 3.0 2.7  --   --    GFR: Estimated Creatinine Clearance: 55.6 mL/min (by C-G formula based on SCr of 1.24 mg/dL). Liver Function Tests:  No results for input(s): AST, ALT, ALKPHOS, BILITOT, PROT, ALBUMIN in the last 168 hours. No results for input(s): LIPASE, AMYLASE in the last 168 hours. No results for input(s): AMMONIA in the last 168 hours. Coagulation Profile: No results for input(s): INR, PROTIME in the last 168 hours. Cardiac Enzymes: No results for input(s): CKTOTAL, CKMB, CKMBINDEX, TROPONINI in the last 168 hours. BNP (last 3 results) No results for input(s): PROBNP in the last 8760 hours. HbA1C: No results for input(s): HGBA1C in the last 72 hours. CBG: Recent Labs  Lab 10/26/20 2029 10/26/20 2350 10/27/20 0456 10/27/20 0757 10/27/20 1229  GLUCAP 124* 136* 114* 134* 124*   Lipid Profile: No results for input(s): CHOL, HDL, LDLCALC, TRIG, CHOLHDL, LDLDIRECT in the last 72 hours. Thyroid Function Tests: No results for input(s): TSH, T4TOTAL, FREET4, T3FREE, THYROIDAB in the last 72 hours. Anemia Panel: No results for input(s): VITAMINB12, FOLATE, FERRITIN, TIBC, IRON, RETICCTPCT in the last 72 hours. Urine analysis:    Component Value Date/Time   COLORURINE YELLOW 10/06/2020 [place] 10/06/2020 1353   LABSPEC 1.020 10/06/2020 1353   PHURINE 6.0 10/06/2020 1353   GLUCOSEU NEGATIVE 10/06/2020 1353   HGBUR TRACE (A) 10/06/2020 1353   BILIRUBINUR NEGATIVE 10/06/2020 1353   KETONESUR NEGATIVE 10/06/2020 1353   PROTEINUR NEGATIVE 10/06/2020 1353   NITRITE NEGATIVE 10/06/2020 1353   LEUKOCYTESUR SMALL (A) 10/06/2020 1353   Sepsis Labs: '@LABRCNTIP'$ (procalcitonin:4,lacticacidven:4)  )No results found for this or any previous visit (from the past 240 hour(s)).       Radiology Studies: No results found.      Scheduled Meds:  amLODipine  10  mg Per Tube Daily   atorvastatin  40 mg Per Tube Daily   enoxaparin (LOVENOX) injection  90 mg Subcutaneous Q12H   feeding supplement (PROSource TF)  45 mL Per Tube BID   fiber  1 packet Per Tube Daily   free water  175 mL Per Tube Q4H   insulin aspart  0-9 Units Subcutaneous Q4H   levETIRAcetam  500 mg Per Tube BID   mouth rinse  15 mL Mouth Rinse BID   Continuous Infusions:  feeding supplement (OSMOLITE 1.5 CAL) 1,000 mL (10/25/20 2058)     LOS: 13 days    Time spent: 35  minutes    Edwin Dada, MD [facility] 10/27/2020, 1:10 PM     Please page though [place] or Epic secure chat:  For [employer], Adult nurse

## 2020-10-27 NOTE — Progress Notes (Signed)
Palliative:  HPI: 70 y.o. 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 Seemed to be making some progress at beginning of hospitalization followed by increased decline in status last few days more concerning for overall prognosis.   I met today at Mr. Lindner bedside along with son, [place]. Devon and I reviewed conversation from yesterday. He expresses that he understands seeing why this conversation was had seeing that his father is more lethargic today and he has not been able to get him to respond. We discussed the difficult of damage to the brain and the unpredictable nature of how this can effect alertness that fluctuates from one day or one hour to the next. Marcelino Scot says he has been speaking with his sister, Adonis Huguenin, more and that they are not "ready to give up." [facility] requests repeat scan to see if brain damage is worsening as this will impact their decision - I explained that I would speak with Dr. Loleta Books but I feel this is a very reasonable request given the gravity of the decisions at hand. Marcelino Scot continues to speak of wanting time to give his father a chance and potential desire for rehab/SNF but asks about transition to hospice if no improvement in 1-2 months. If some improvement [place] talks of moving from [place] to come help care for his father so he will not need to live in facility.   He continues to confirm that they want to respect his wishes of not living in a nursing home but may consider trial period if he continues to be more alert and interactive. We further discussed taking Mr. Klemann lead and obtaining scan and also monitoring over the next  couple days. I explained that in my experience his father will either make some small progress that may give [place] some more hope or he will digress into more lethargic states and become less and less interactive (we are seeing this today). I suggested that we continue to monitor but hold on PEG placement for right now to ensure that Mr. Lips continues to remain awake and interactive because if he does not and he continues to decline then we do not want to put him through a surgery with PEG that will be of limited benefit. Devon agrees with plan. We also discussed resuscitation and Mr. Yordy desire for code status if he were to worsen and [place] states that his father would not want these measures and agrees with DNR status.   I called and spoke on conference call with brother Marland Kitchen and daughter Adonis Huguenin. Marland Kitchen initially requests pursuing SNF and PEG placement but after I updated them on status today and my conversation with [place] they agree with a couple days of watchful waiting and monitoring giving decreased mental status today. I discussed with them the same as with [facility] above. I also discussed with them regarding DNR status and they all agree without hesitancy that DNR status is aligned with Mr. Pella wishes. I also clarified that there is no documented HCPOA and they state that they were in process of completing but there is no document. Without documented HCPOA and no spouse his 2 adult children ([place] and [place]) are his Therapist, music.   All questions/concerns addressed. Emotional  support provided. Discussed plan with Dr. Loleta Books.   Exam: Less responsive today. He did finally open eyes with some tracking and head nods after cold washcloth to face and repeated stimulation to awaken but he did not stay awake long. Purposeful movement of RUE. No distress. Cortak in place. Breathing regular, unlabored. Abd soft.   Plan: - DNR decided.  - Watchful waiting over next couple days  while awaiting repeat scan and allowing time for Mr. Kolbe to declare himself. If he has declined mental status family would like to reconsider goals to focus more on comfort/hospice. Tentative plan for SNF and PEG placement if he continues to be awake, interactive or with further signs of improvement.   [place], NP Palliative Medicine Team Pager [phone] (Please see [email or web address] for schedule) Team Phone [phone]    Greater than 50%  of this time was spent counseling and coordinating care related to the above assessment and plan

## 2020-10-27 NOTE — Progress Notes (Signed)
  Speech Language Pathology Treatment: Dysphagia  Patient Details Name: Clifford [name] MRN: [id number] DOB: 09-11-1950 Today's Date: 10/27/2020 Time: [id number] SLP Time Calculation (min) (ACUTE ONLY): 13 min  Assessment / Plan / Recommendation Clinical Impression  Pt is drowsy this morning but opening his eyes perhaps a little more to stimulation compared to SLP's last visit. He nodded his head x2 in response to yes/no questions, although not to questions with which accuracy could be assessed. During direct trials, he did not respond. Attempted to provide spoonfuls of thickened liquids and pureed foods. When pt had his eyes open, he would part his lips in response; however, he made no attempts to orally manipulate or swallow boluses. Some would spill anteriorly from his mouth, as did saliva. The rest was orally suctioned by SLP when he did not initiate a swallow. Recommend that he remain NPO with frequent oral care, as risk for aspiration remains given reduced secretion management.   HPI HPI: 70yo male admitted 10/14/20 with right side headache and LUE weakness. 10/19/20 pt was noted to have increased LUE weakness. 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 CTHead = evolving RMCA infarct with cytotoxic edema, mass effect      SLP Plan  Continue with current plan of care       Recommendations  Diet recommendations: NPO Medication Administration: Via alternative means                Oral Care Recommendations: Oral care QID Follow up Recommendations: Skilled Nursing facility SLP Visit Diagnosis: Dysphagia, oropharyngeal phase (R13.12) Plan: Continue with current plan of care       GO                Osie Bond., M.A. [place] Acute Rehabilitation Services Pager [phone] Office [phone]  10/27/2020, 10:29 AM

## 2020-10-27 NOTE — Progress Notes (Signed)
Physical Therapy Treatment Patient Details Name: Clifford [name] MRN: [id number] DOB: February 25, 1951 Today's Date: 10/27/2020    History of Present Illness 70 yo male presented to ED on 8/11 for R sided headache and worsening L UE weakness. Patient recently admitted 8/3-8/5 for R CVA with L visual deficits and L sided weakness. MRI with mild enlargement of R MCA/border zone infarct since 10/07/20 MRI and increased cytotoxic edema and petechial hemorrhage without midline shift. Pt currently with cortrak.  Has palliative consult. PMH: HTN, HLD, hx of PE on coumadin, CVA 10/2020    PT Comments    Pt requiring total A of 2 for rolling and transfer to EOB.  Unsafe to attempt standing due to pt unable to assist.  Pt did sit at EOB for 15 mins with min-mod A but was lethargic and not actively participating. Not able to follow any commands or attend to therapist.  Pt with poor rehab potential and prognosis per MD notes.     Follow Up Recommendations  SNF;Supervision/Assistance - 24 hour     Equipment Recommendations  Other (comment) (defer to post acute:)    Recommendations for Other Services       Precautions / Restrictions Precautions Precautions: Fall Precaution Comments: L visual field deficit, L inattention; cortrak    Mobility  Bed Mobility Overal bed mobility: Needs Assistance Bed Mobility: Rolling;Supine to Sit;Sit to Supine Rolling: Total assist;+2 for physical assistance   Supine to sit: Total assist;+2 for physical assistance Sit to supine: Total assist;+2 for physical assistance   General bed mobility comments: Pt requiring total A of 2 for all aspects of transfers.  Initially, with strong posterior lean making transition to EOB difficult    Transfers                 General transfer comment: unsafe to attempt  Ambulation/Gait                 Stairs             Wheelchair Mobility    Modified Rankin (Stroke Patients Only) Modified Rankin (Stroke  Patients Only) Pre-Morbid Rankin Score: Moderately severe disability Modified Rankin: Severe disability     Balance Overall balance assessment: Needs assistance Sitting-balance support: Single extremity supported;Feet supported Sitting balance-Leahy Scale: Poor Sitting balance - Comments: Sat EOB for at least 15 mins (OT supporting pt's back and PT positioned in chair in front of pt).  Initially with L and posterior lean/pushing but able to correct requiring min-mod A to maintain balance but no longer pushing.  While at EOB focused on: holding head up with assist, trying to increase alertness, positioning feet on floor for plantarflexor stretch and weight input, assist to reach with R UE forward and rotation, attempted to lean onto L elbow but this increased tone and pushing, was able to lean onto L arm forward place on chair armrest in front of pt.       Standing balance comment: unsafe to attempt                            Cognition Arousal/Alertness: Lethargic Behavior During Therapy: Flat affect Overall Cognitive Status: Impaired/Different from baseline Area of Impairment: Problem solving;Awareness;Attention;Following commands;Safety/judgement                   Current Attention Level: Focused           General Comments: Pt not able to follow any commands.  Eyes remained closed for most of session but did occasionally open for ~30 seconds at a time with max stimulation. Pt not able to attend to therapist. He did respond with one nod "yes" that he was comfortable in bed.      Exercises      General Comments        Pertinent Vitals/Pain Pain Assessment: No/denies pain Faces Pain Scale: No hurt    Home Living                      Prior Function            PT Goals (current goals can now be found in the care plan section) Progress towards PT goals: Not progressing toward goals - comment    Frequency    Min 3X/week      PT Plan  Current plan remains appropriate    Co-evaluation PT/OT/SLP Co-Evaluation/Treatment: Yes Reason for Co-Treatment: Complexity of the patient's impairments (multi-system involvement);For patient/therapist safety PT goals addressed during session: Balance;Mobility/safety with mobility        AM-PAC PT "6 Clicks" Mobility   Outcome Measure  Help needed turning from your back to your side while in a flat bed without using bedrails?: Total Help needed moving from lying on your back to sitting on the side of a flat bed without using bedrails?: Total Help needed moving to and from a bed to a chair (including a wheelchair)?: Total Help needed standing up from a chair using your arms (e.g., wheelchair or bedside chair)?: Total Help needed to walk in hospital room?: Total Help needed climbing 3-5 steps with a railing? : Total 6 Click Score: 6    End of Session Equipment Utilized During Treatment: Gait belt Activity Tolerance: Other (comment) (Tolerated but with no active participation) Patient left: in bed;with bed alarm set;with call Masek/phone within reach;with SCD's reapplied (L heel floating, HOB 35 degrees and cortrak restarted) Nurse Communication: Mobility status PT Visit Diagnosis: Unsteadiness on feet (R26.81);Muscle weakness (generalized) (M62.81);Other symptoms and signs involving the nervous system (R29.898);Other abnormalities of gait and mobility (R26.89)     Time: 1020-1048 PT Time Calculation (min) (ACUTE ONLY): 28 min  Charges:  $Neuromuscular Re-education: 8-22 mins                     Abran Richard, PT Acute Rehab Services Pager [phone] [facility] [phone]    Karlton Lemon 10/27/2020, 12:42 PM

## 2020-10-27 NOTE — Progress Notes (Signed)
Occupational Therapy Treatment Patient Details Name: Clifford [name] MRN: [id number] DOB: 09/08/1950 Today's Date: 10/27/2020    History of present illness 70 yo male presented to ED on 8/11 for R sided headache and worsening L UE weakness. Patient recently admitted 8/3-8/5 for R CVA with L visual deficits and L sided weakness. MRI with mild enlargement of R MCA/border zone infarct since 10/07/20 MRI and increased cytotoxic edema and petechial hemorrhage without midline shift. Pt currently with cortrak.  Has palliative consult. PMH: HTN, HLD, hx of PE on coumadin, CVA 10/2020   OT comments  Pt received in bed, pt opened eyes  to therapist speaking loudly but quickly closed them. Pt with minimal eye opening this session and not actively participating. Pt required totalA+2 to progress to sitting EOB. No increase in level of arousal. Pt not following any commands during session. Pt will continue to benefit from skilled OT services to maximize safety and independence with ADL/IADL and functional mobility. Will continue to follow acutely and progress as tolerated.    Follow Up Recommendations  SNF    Equipment Recommendations  None recommended by OT    Recommendations for Other Services      Precautions / Restrictions Precautions Precautions: Fall Precaution Comments: L visual field deficit, L inattention; cortrak Restrictions Weight Bearing Restrictions: No Other Position/Activity Restrictions: now with cortrak       Mobility Bed Mobility Overal bed mobility: Needs Assistance Bed Mobility: Rolling;Supine to Sit;Sit to Supine Rolling: Total assist;+2 for physical assistance   Supine to sit: Total assist;+2 for physical assistance Sit to supine: Total assist;+2 for physical assistance   General bed mobility comments: Pt requiring total A of 2 for all aspects of transfers.  Initially, with strong posterior lean making transition to EOB difficult    Transfers                  General transfer comment: unsafe to attempt    Balance Overall balance assessment: Needs assistance Sitting-balance support: Single extremity supported;Feet supported Sitting balance-Leahy Scale: Poor Sitting balance - Comments: Sat EOB for at least 15 mins (OT supporting pt's back and PT positioned in chair in front of pt).  Initially with L and posterior lean/pushing but able to correct requiring min-mod A to maintain balance but no longer pushing.  While at EOB focused on: holding head up with assist, trying to increase alertness, positioning feet on floor for plantarflexor stretch and weight input, assist to reach with R UE forward and rotation, attempted to lean onto L elbow but this increased tone and pushing, was able to lean onto L arm forward place on chair armrest in front of pt.       Standing balance comment: unsafe to attempt                           ADL either performed or assessed with clinical judgement   ADL Overall ADL's : Needs assistance/impaired                                       General ADL Comments: pt required totalA for all aspects of ADL, pt not following any commands this session     Vision   Vision Assessment?: Yes Alignment/Gaze Preference: Head turned;Gaze right Additional Comments: minimal eye opening this date   Perception     Praxis  Cognition Arousal/Alertness: Lethargic Behavior During Therapy: Flat affect Overall Cognitive Status: Impaired/Different from baseline Area of Impairment: Problem solving;Awareness;Attention;Following commands;Safety/judgement                   Current Attention Level: Focused           General Comments: Pt not able to follow any commands. Eyes remained closed for most of session but did occasionally open for ~30 seconds at a time with max stimulation. Pt not able to attend to therapist. He did respond with one nod "yes" that he was comfortable in bed.         Exercises     Shoulder Instructions       General Comments      Pertinent Vitals/ Pain       Pain Assessment: No/denies pain Pain Intervention(s): Monitored during session  Home Living                                          Prior Functioning/Environment              Frequency  Min 2X/week        Progress Toward Goals  OT Goals(current goals can now be found in the care plan section)  Progress towards OT goals: Not progressing toward goals - comment  Acute Rehab OT Goals Patient Stated Goal: none stated OT Goal Formulation: Patient unable to participate in goal setting Time For Goal Achievement: 11/10/20 Potential to Achieve Goals: Poor ADL Goals Pt Will Perform Grooming: with mod assist;sitting;bed level Pt Will Perform Lower Body Bathing: with mod assist;bed level Pt Will Perform Lower Body Dressing: with mod assist;sitting/lateral leans Pt Will Transfer to Toilet: with mod assist Pt Will Perform Toileting - Clothing Manipulation and hygiene: with mod assist;bed level (rolling) Additional ADL Goal #1: Pt will locate three grooming items in left visual field with Min cues Additional ADL Goal #2: Pt will demonstrate emergent awareness during ADLs with Min cues  Plan Discharge plan needs to be updated    Co-evaluation    PT/OT/SLP Co-Evaluation/Treatment: Yes Reason for Co-Treatment: Complexity of the patient's impairments (multi-system involvement);For patient/therapist safety;To address functional/ADL transfers PT goals addressed during session: Balance;Mobility/safety with mobility OT goals addressed during session: ADL's and self-care      AM-PAC OT "6 Clicks" Daily Activity     Outcome Measure   Help from another person eating meals?: Total Help from another person taking care of personal grooming?: Total Help from another person toileting, which includes using toliet, bedpan, or urinal?: Total Help from another person bathing  (including washing, rinsing, drying)?: Total Help from another person to put on and taking off regular upper body clothing?: Total Help from another person to put on and taking off regular lower body clothing?: Total 6 Click Score: 6    End of Session    OT Visit Diagnosis: Unsteadiness on feet (R26.81)   Activity Tolerance Patient limited by lethargy   Patient Left in bed;with call Knisely/phone within reach;with bed alarm set   Nurse Communication Mobility status        Time: [id number] OT Time Calculation (min): 28 min  Charges: OT General Charges $OT Visit: 1 Visit OT Treatments $Self Care/Home Management : 8-22 mins  Helene Kelp OTR/L Acute Rehabilitation Services Office: [place] 10/27/2020, 2:38 PM

## 2020-10-28 ENCOUNTER — Inpatient Hospital Stay (HOSPITAL_COMMUNITY): Payer: No Typology Code available for payment source

## 2020-10-28 DIAGNOSIS — Z515 Encounter for palliative care: Secondary | ICD-10-CM | POA: Diagnosis not present

## 2020-10-28 DIAGNOSIS — Z7189 Other specified counseling: Secondary | ICD-10-CM

## 2020-10-28 DIAGNOSIS — I639 Cerebral infarction, unspecified: Secondary | ICD-10-CM | POA: Diagnosis not present

## 2020-10-28 LAB — GLUCOSE, CAPILLARY
Glucose-Capillary: 114 mg/dL — ABNORMAL HIGH (ref 70–99)
Glucose-Capillary: 121 mg/dL — ABNORMAL HIGH (ref 70–99)
Glucose-Capillary: 126 mg/dL — ABNORMAL HIGH (ref 70–99)
Glucose-Capillary: 130 mg/dL — ABNORMAL HIGH (ref 70–99)
Glucose-Capillary: 132 mg/dL — ABNORMAL HIGH (ref 70–99)
Glucose-Capillary: 132 mg/dL — ABNORMAL HIGH (ref 70–99)
Glucose-Capillary: 93 mg/dL (ref 70–99)

## 2020-10-28 LAB — BASIC METABOLIC PANEL
Anion gap: 8 (ref 5–15)
BUN: 41 mg/dL — ABNORMAL HIGH (ref 8–23)
CO2: 26 mmol/L (ref 22–32)
Calcium: 9.1 mg/dL (ref 8.9–10.3)
Chloride: 107 mmol/L (ref 98–111)
Creatinine, Ser: 1.31 mg/dL — ABNORMAL HIGH (ref 0.61–1.24)
GFR, Estimated: 58 mL/min — ABNORMAL LOW (ref >60)
Glucose, Bld: 114 mg/dL — ABNORMAL HIGH (ref 70–99)
Potassium: 4.3 mmol/L (ref 3.5–5.1)
Sodium: 141 mmol/L (ref 135–145)

## 2020-10-28 LAB — CBC
HCT: 36.9 % — ABNORMAL LOW (ref 39.0–52.0)
Hemoglobin: 11.5 g/dL — ABNORMAL LOW (ref 13.0–17.0)
MCH: 25.6 pg — ABNORMAL LOW (ref 26.0–34.0)
MCHC: 31.2 g/dL (ref 30.0–36.0)
MCV: 82.2 fL (ref 80.0–100.0)
Platelets: 130 10*3/uL — ABNORMAL LOW (ref 150–400)
RBC: 4.49 MIL/uL (ref 4.22–5.81)
RDW: 14.8 % (ref 11.5–15.5)
WBC: 4.9 10*3/uL (ref 4.0–10.5)
nRBC: 0 % (ref 0.0–0.2)

## 2020-10-28 LAB — PSA: Prostatic Specific Antigen: 5.46 ng/mL — ABNORMAL HIGH (ref 0.00–4.00)

## 2020-10-28 MED ORDER — LORAZEPAM 2 MG/ML IJ SOLN
1.0000 mg | Freq: Once | INTRAMUSCULAR | Status: AC | PRN
  Administered 2020-10-28: 1 mg via INTRAVENOUS
  Filled 2020-10-28: qty 1

## 2020-10-28 MED ORDER — HEPARIN (PORCINE) 25000 UT/250ML-% IV SOLN
800.0000 [IU]/h | INTRAVENOUS | Status: DC
  Administered 2020-10-28: 1200 [IU]/h via INTRAVENOUS
  Administered 2020-10-29: 1050 [IU]/h via INTRAVENOUS
  Administered 2020-11-01: 950 [IU]/h via INTRAVENOUS
  Filled 2020-10-28 (×5): qty 250

## 2020-10-28 MED ORDER — FREE WATER: 200.0000 mL | Status: DC

## 2020-10-28 NOTE — Plan of Care (Signed)
  Problem: Coping: Goal: Level of anxiety will decrease Outcome: Progressing   Problem: Elimination: Goal: Will not experience complications related to bowel motility Outcome: Progressing Goal: Will not experience complications related to urinary retention Outcome: Progressing   Problem: Pain Managment: Goal: General experience of comfort will improve Outcome: Progressing   

## 2020-10-28 NOTE — Progress Notes (Signed)
[place] for switch Pradaxa to Lovenox  Indication: hx pulmonary embolus  Allergies  Allergen Reactions   Apixaban     Other reaction(s): Liver enzymes abnormal   Lisinopril     Other reaction(s): Cough (finding)   Simvastatin     Other reaction(s): Pain in lower limb (finding)    Patient Measurements: Height: '5\' 5"'$  (165.1 cm) Weight: 87.4 kg (192 lb 10.9 oz) IBW/kg (Calculated) : 61.5  Vital Signs: Temp: 98.9 F (37.2 C) (08/25 0808) Temp Source: Oral (08/25 0808) BP: 143/85 (08/25 0327) Pulse Rate: 71 (08/25 0808)  Labs: Recent Labs    10/26/20 0251  HGB 12.1*  HCT 38.6*  PLT 145*  CREATININE 1.24     Estimated Creatinine Clearance: 55.6 mL/min (by C-G formula based on SCr of 1.24 mg/dL).   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 Last dose of Pradaxa 8/19 at 1331 PM. Pharmacy asked on 10/22/20 to dose Lovenox in the interim > lovenox '90mg'$  SQ q12h.  Currently on Lovenox '1mg'$ /kg sq q12h AKI resolved, SCr down from 1.43>> to 1.24.  CrCl 56 ml/min CBC is within normal limits. No bleeding reported.    Diet: NPO, on tube feeding  Weight: 87 kg from 93 kg   Goal of Therapy:  Monitor platelets by anticoagulation protocol: Yes   Plan:  - Continue Lovenox 90 mg SQ q 12 hrs - Obtain LMWH level 8/25  after 1700 dose to ensure not supratherapeutic - 8/25 CBC pending, monitor for s/s bleeding - F/u ability to swallow and switch back to Pradaxa   Thank you for allowing pharmacy to be a part of this patient's care.  AArdyth Harps PharmD Clinical Pharmacist

## 2020-10-28 NOTE — Progress Notes (Signed)
[facility] PROGRESS NOTE    DALEON BUZBY  [id number] DOB: November 16, 1950 DOA: 10/14/2020 PCP: Pcp, No      Brief Narrative:  Mr. Mcgillivary is a 70 y.o. 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    Was admitted 2 days after his initial stroke on 8/3, discharged with [place]. After about 4 days at home, he developed worsening of his stroke symptoms, returned to the hospital.  In the ER, found to have enlargement of his previous stroke with hemorrhagic conversion.  Initially relatively mild deficits, NIHSS 4.  Pradaxa was held and he was admitted for SNF placement for rehab.     However, the patient started to develop progressively worse left hemiparesis, then somnolence (see NIHSS chart in progress note 8/24).  Neurology were reconsulted.  Serial head CTs were obtained on 8/16 that showed expected evolution/progression of stroke, furthering edema, but no herniation or substantial midline shift.  Neurology followed the patient serially and recommended continued supportive care and SNF placement.  An NG was placed as the patient lost the ability to swallow.               Assessment & Plan:  Progression of right parietal occipital watershed stroke complicated by petechial hemorrhage Patient admitted and Pradaxa held.  As above, he was initially mildly impaired, but has had worsening swelling and worsening stroke symptoms, now cannot speak or swallow.  MRI repeated this morning shows persistent edema. Neurology were re-consulted with regard to prognosis and utility of hypertonic saline.  After their review, they feel his swelling is more prolonged and more severe than typical, but still within the range of expected, likely to improve, and that hypertonic saline would be unlikely to give added benefit at this time.    MRI  shows no worsening of petechial hemorrhage and no hematoma, but we will switch Lovenox to heparin gtt, given the theoretical higher risk of cerebral hemorrhage with Lovenox.    Neurology are confident his swelling is transient, and he will regain speech, and hopefully swallowing, and maybe the ability to stand or even walk.  In light of this potential for recovery, Neurology recommend: - Continue atorvastatin - Stop free water to reduce cerebral edema  - Continue supportive care and serial neurological exams - Repeat CT head on Monday - Anticipate PEG placement next week  The patient will need to improve somewhat from his current state in order to be able to engage with therapy prior to discharge but we believe he will and then can qualify for STR with Palliative following.        Hypertension Bp normal - Continue amlodipine  Suspected focal seizures Presented with left hand twitching which was nonsuppressible and clinically suspicious for stroke, and was started on Keppra.    Keppra reduced 8/24 to eliminate sedating side effects from [place] from limiting his rehab - Continue Keppra, low treshold to stop as LTM showed no seizures and he has had no seizure like activity recently   CKD IIIa Baseline Cr 1.2-1.4 - MOnitor BMP while holding free water, treat hypernatremia or renal insufficiency with IV saline  Dysphagia - Contineu Tube feeds - Will plan for PEG next week, IR were consulted and believe he may have a window for IR PEG placement, will need repeat order for "IR Eval and Mgmt" to re-engage them -  If IR PEG placement turns out not to be possible, will need surgical placement    History of recurrent VTE - Transition to heparin gtt with transition to Eliquis after PEG placement   Lung nodule Incidental finding of 24m LUL nodule.  Has not been discussed with family. Patient former smoker. - Recommend follow up CT in 6-12 months depending on neurological  progression  Possible sclerotic vertebra on imaging Incidental finding.  PSA 5, unlikely this sclerosis is from metastatic prostate cancer with that level.   Chronic diastolic CHF  Appears euvolemic.  Not on diuretic at baseline  Hyperkalemia Resolved with [place]elma           Disposition: Status is: Inpatient  Remains inpatient appropriate because:IV treatments appropriate due to intensity of illness or inability to take PO  Dispo: The patient is from: Home              Anticipated d/c is to:  TBD              Patient currently is not medically stable to d/c.   Difficult to place patient No   Patient admitted with worsening stroke symptoms.  Has had progressively worsening mentation and symptoms since hospital day 3, likely due to extension, evolution and microhemorrhage of his initial infarct.  Possibly due to [place]  Reduce Keppra.  Attempt SLP and regain ability to swallow.    We will seek SNF placement and place PEG to prepare.          Level of care: Telemetry Medical       MDM: The below labs and imaging reports were reviewed and summarized above.  Medication management as above.    DVT prophylaxis: Heparin  Code Status: FULL Family Communication: Son DMarylandat bedside, daughter [name] by phone and BJohnathan Hausenby phone            Subjective: Patient without fever or respiratory distress.  Without agitation.  Has been more active today.  Able to lift right arm to command, able to open eyes and attempt verbalization to command but nothing intelligible.         Objective: Vitals:   10/28/20 0003 10/28/20 0327 10/28/20 0454 10/28/20 0808  BP: (!) 148/100 (!) 143/85    Pulse: 68 78  71  Resp: '19 20  18  '$ Temp: 98.7 F (37.1 C) 98.6 F (37 C)  98.9 F (37.2 C)  TempSrc: Oral Oral  Oral  SpO2: 100% 97%  100%  Weight: 87.4 kg  87.4 kg   Height:        Intake/Output Summary (Last 24 hours) at 10/28/2020 0920 Last data  filed at 10/28/2020 0820 Gross per 24 hour  Intake --  Output 250 ml  Net -250 ml   Filed Weights   10/27/20 0456 10/28/20 0003 10/28/20 0454  Weight: 87.4 kg 87.4 kg 87.4 kg    Examination: General appearance:  Adult male, lying in bed, appears asleep, slumped to the side, does not wake to voice  HEENT: Anicteric.  Pupils equal and reactive, does not open eyes spontaneously today. Lips normal, edentulous, OP moist.   Skin: Warm and dry, no suspicious rashes or lesions. Cardiac: RRR, no murmurs, no LE edema.    Respiratory: Shallow respirations.  No rales that I can appreciate, no wheezing.    Abdomen: Abdomen soft, no grimace to palpation, no masses, no distension. MSK:   Neuro: Follows commands, weakly.  Nonverbal. Left facial droop, pupils equal, left side  flaccid.    Psych: Unable to assess    Data Reviewed: I have personally reviewed following labs and imaging studies:  CBC: Recent Labs  Lab 10/25/20 0030 10/26/20 0251  WBC 5.7 5.8  HGB 13.1 12.1*  HCT 41.1 38.6*  MCV 81.2 82.0  PLT 178 [id number]*   Basic Metabolic Panel: Recent Labs  Lab 10/22/20 0509 10/22/20 1719 10/23/20 0437 10/23/20 1645 10/25/20 0030 10/26/20 0251  NA 137  --   --   --  135 136  K 4.1  --   --   --  5.7* 4.3  CL 105  --   --   --  105 105  CO2 23  --   --   --  20* 23  GLUCOSE 103*  --   --   --  122* 112*  BUN 19  --   --   --  30* 33*  CREATININE 1.31*  --   --   --  1.16 1.24  CALCIUM 8.9  --   --   --  8.8* 9.0  MG 2.1 2.2 2.3 2.2  --   --   PHOS 3.4 3.0 3.0 2.7  --   --    GFR: Estimated Creatinine Clearance: 55.6 mL/min (by C-G formula based on SCr of 1.24 mg/dL). Liver Function Tests: No results for input(s): AST, ALT, ALKPHOS, BILITOT, PROT, ALBUMIN in the last 168 hours. No results for input(s): LIPASE, AMYLASE in the last 168 hours. No results for input(s): AMMONIA in the last 168 hours. Coagulation Profile: No results for input(s): INR, PROTIME in the last 168  hours. Cardiac Enzymes: No results for input(s): CKTOTAL, CKMB, CKMBINDEX, TROPONINI in the last 168 hours. BNP (last 3 results) No results for input(s): PROBNP in the last 8760 hours. HbA1C: No results for input(s): HGBA1C in the last 72 hours. CBG: Recent Labs  Lab 10/27/20 1621 10/27/20 2023 10/28/20 0004 10/28/20 0326 10/28/20 0804  GLUCAP 116* 136* 93 130* 126*   Lipid Profile: No results for input(s): CHOL, HDL, LDLCALC, TRIG, CHOLHDL, LDLDIRECT in the last 72 hours. Thyroid Function Tests: No results for input(s): TSH, T4TOTAL, FREET4, T3FREE, THYROIDAB in the last 72 hours. Anemia Panel: No results for input(s): VITAMINB12, FOLATE, FERRITIN, TIBC, IRON, RETICCTPCT in the last 72 hours. Urine analysis:    Component Value Date/Time   COLORURINE YELLOW 10/06/2020 [place] 10/06/2020 1353   LABSPEC 1.020 10/06/2020 1353   PHURINE 6.0 10/06/2020 1353   GLUCOSEU NEGATIVE 10/06/2020 1353   HGBUR TRACE (A) 10/06/2020 1353   BILIRUBINUR NEGATIVE 10/06/2020 1353   KETONESUR NEGATIVE 10/06/2020 1353   PROTEINUR NEGATIVE 10/06/2020 1353   NITRITE NEGATIVE 10/06/2020 1353   LEUKOCYTESUR SMALL (A) 10/06/2020 1353   Sepsis Labs: '@LABRCNTIP'$ (procalcitonin:4,lacticacidven:4)  )No results found for this or any previous visit (from the past 240 hour(s)).       Radiology Studies: CT ABDOMEN WO CONTRAST  Result Date: 10/28/2020 CLINICAL DATA:  History of stroke. Evaluate anatomy for gastrostomy tube placement. EXAM: CT ABDOMEN WITHOUT CONTRAST TECHNIQUE: Multidetector CT imaging of the abdomen was performed following the standard protocol without IV contrast. COMPARISON:  None. FINDINGS: Lower chest: 8 mm round nodule along the periphery of the left upper lobe on sequence 5 image 4. Pleural-based densities in the left lower lobe could represent atelectasis or scarring. No large pleural effusions. Hepatobiliary: 1.2 cm low-density structure along the hepatic dome  is likely an incidental finding such as a cyst. Normal appearance of  the gallbladder. Pancreas: Unremarkable. No pancreatic ductal dilatation or surrounding inflammatory changes. Spleen: Normal in size without focal abnormality. Adrenals/Urinary Tract: Normal adrenal glands. Exophytic low-density structure in the anterior right kidney probably represents a cyst. Significant motion artifact in the kidneys. There is an additional cyst in the right kidney lower pole measuring up to 3.7 cm. Peripheral calcifications along the lateral aspect of the left kidney that are poorly characterized due to motion artifact. Negative for hydronephrosis. Stomach/Bowel: Currently, the transverse colon is anterior to the distal stomach body. There is a feeding tube that extends into the stomach and proximal duodenum. There is a very small percutaneous window between the transverse colon and the splenic flexure for percutaneous gastrostomy tube access. No evidence for bowel inflammation or obstruction. Vascular/Lymphatic: Diffuse atherosclerotic calcifications in the aorta and iliac arteries without an aortic aneurysm. No significant lymph node enlargement in the abdomen. Prominent coronary artery calcifications in the LAD distribution. Other: Negative for ascites. Musculoskeletal: Poorly defined sclerosis in the L3 vertebral body is nonspecific. Degenerative facet disease in lower lumbar spine. Poorly defined sclerosis involving the posterior aspect of S1. IMPRESSION: 1. Small percutaneous window for gastrostomy tube placement between the transverse colon and the splenic flexure as described. Anatomy is amendable for percutaneous gastrostomy tube placement but consider visualization of the transverse colon during gastrostomy tube placement. 2. **An incidental finding of potential clinical significance has been found. Indeterminate 8 mm nodule in the left upper lobe. No comparison imaging to evaluate for stability. Non-contrast chest CT  at 6-12 months is recommended. If the nodule is stable at time of repeat CT, then future CT at 18-24 months (from today's scan) is considered optional for low-risk patients, but is recommended for high-risk patients. This recommendation follows the consensus statement: Guidelines for Management of Incidental Pulmonary Nodules Detected on CT Images: From the Fleischner Society 2017; Radiology 2017; 284:228-243.** 3. Indeterminate sclerosis involving the L3 vertebral body. Consider correlating with PSA level. 4. Indeterminate calcifications along the periphery of the left kidney. This area is very poorly characterized due to excessive motion artifact. This could be related to old hemorrhage, trauma or even previous surgery. Electronically Signed   By: Markus Daft M.D.   On: 10/28/2020 08:05        Scheduled Meds:  amLODipine  10 mg Per Tube Daily   atorvastatin  40 mg Per Tube Daily   enoxaparin (LOVENOX) injection  90 mg Subcutaneous Q12H   feeding supplement (PROSource TF)  45 mL Per Tube BID   fiber  1 packet Per Tube Daily   free water  175 mL Per Tube Q4H   insulin aspart  0-9 Units Subcutaneous Q4H   levETIRAcetam  500 mg Per Tube BID   mouth rinse  15 mL Mouth Rinse BID   Continuous Infusions:  feeding supplement (OSMOLITE 1.5 CAL) 1,000 mL (10/28/20 0833)     LOS: 14 days    Time spent: 35  minutes    Edwin Dada, MD [facility] 10/28/2020, 9:20 AM     Please page though [place] or Epic secure chat:  For [employer], Adult nurse

## 2020-10-28 NOTE — Progress Notes (Signed)
ANTICOAGULATION CONSULT NOTE - Initial Consult  Pharmacy Consult for heparin (PTA Pradaxa on hold d/t NPO status) Indication: history of pulmonary embolus  Allergies  Allergen Reactions   Apixaban     Other reaction(s): Liver enzymes abnormal   Lisinopril     Other reaction(s): Cough (finding)   Simvastatin     Other reaction(s): Pain in lower limb (finding)    Patient Measurements: Height: '5\' 5"'$  (165.1 cm) Weight: 87.4 kg (192 lb 10.9 oz) IBW/kg (Calculated) : 61.5 Heparin Dosing Weight: 82.5 kg  Vital Signs: Temp: 98.3 F (36.8 C) (08/25 1455) Temp Source: Oral (08/25 1455) BP: 138/64 (08/25 1455) Pulse Rate: 58 (08/25 1455)  Labs: Recent Labs    10/26/20 0251 10/28/20 1121  HGB 12.1* 11.5*  HCT 38.6* 36.9*  PLT 145* 130*  CREATININE 1.24 1.31*    Estimated Creatinine Clearance: 52.6 mL/min (A) (by C-G formula based on SCr of 1.31 mg/dL (H)).   Medical History: Past Medical History:  Diagnosis Date   BPH (benign prostatic hyperplasia)    Chronic anticoagulation 10/14/2020   Essential hypertension 10/06/2020   History of pulmonary embolism 10/14/2020   HLD (hyperlipidemia)    HTN (hypertension)    Lung nodule    62m in 2015   Malignant neoplasm of left kidney ([place]    Mixed hyperlipidemia 10/06/2020   Nicotine dependence, cigarettes, uncomplicated [id number]  Peripheral vascular disease ([place]    Polyp of colon    Prediabetes 10/14/2020   Pulmonary embolus ([place]    [place] 10/06/2020    Medications:  Medications Prior to Admission  Medication Sig Dispense Refill Last Dose   atorvastatin (LIPITOR) 80 MG tablet Take 40 mg by mouth 1 day or 1 dose.   10/14/2020   dabigatran (PRADAXA) 150 MG CAPS capsule Take 1 capsule (150 mg total) by mouth 2 (two) times daily. 60 capsule 0 10/14/2020 at 0800   finasteride (PROSCAR) 5 MG tablet Take 5 mg by mouth daily at 6 (six) AM.   10/14/2020   hydrochlorothiazide (HYDRODIURIL) 25 MG tablet Take 25 mg by mouth daily.       losartan (COZAAR) 50 MG tablet Take 100 mg by mouth daily.   10/14/2020   potassium chloride SA (KLOR-CON) 20 MEQ tablet Take 10 mEq by mouth daily.   10/14/2020   psyllium (METAMUCIL) 58.6 % packet Take 1 packet by mouth daily.   10/14/2020   Scheduled:   amLODipine  10 mg Per Tube Daily   atorvastatin  40 mg Per Tube Daily   feeding supplement (PROSource TF)  45 mL Per Tube BID   fiber  1 packet Per Tube Daily   insulin aspart  0-9 Units Subcutaneous Q4H   levETIRAcetam  500 mg Per Tube BID   mouth rinse  15 mL Mouth Rinse BID    Assessment: 70yo male on chronic Pradaxa for hx PE's, now unable to swallow Pradaxa whole and it cannot be crushed. Last dose of Pradaxa 8/19 at 1331 PM. Pharmacy asked on 10/22/20 to dose Lovenox in the interim > lovenox '90mg'$  SQ q12h.  Due to concerns for neurological worsening. At neurology's recommendation, change to heparin gtt to reduce risk of cerebral bleeding given current micro-hemorrhage. Will target lower end of goal range and forego bolus due to recent stroke.   Goal of Therapy:  Heparin level 0.3-0.5 units/ml Monitor platelets by anticoagulation protocol: Yes    Plan:  - Stop enoxaparin (last dose 8/25 @ 0500) - Start heparin infusion  at 1200 units/hr - Check anti-Xa level in 8 hours and daily while on heparin - Continue to monitor H&H and platelets - F/U ability to swallow and switch back to DOAC   Thank you for allowing pharmacy to be a part of this patient's care.  Ardyth Harps, PharmD Clinical Pharmacist

## 2020-10-28 NOTE — Progress Notes (Addendum)
Nutrition Follow-up  DOCUMENTATION CODES:  Not applicable  INTERVENTION:  Continue TF via Cortrak: -Osmolite 1.5 @ 63m/hr (13227md)  -4556mrosource TF BID -free water per MD  Provides 2060 kcals, 104 grams protein, 1005m44mee water   May continue TF regimen if/when PEG is placed  NUTRITION DIAGNOSIS:  Inadequate oral intake related to dysphagia as evidenced by NPO status. -- ongoing  GOAL:  Patient will meet greater than or equal to 90% of their needs -- met with TF  MONITOR:  TF tolerance, Weight trends, Labs, I & O's  REASON FOR ASSESSMENT:  Consult Enteral/tube feeding initiation and management  ASSESSMENT:  Pt with a PMH significant for CHF, HTN, HLD, PE, recent R MCA/PCA watershed ischemic stroke admitted with evolving/increasing size of stroke, petechial hemorrhages, cytotoxic edema, concern for new seizures.  8/19 Cortrak placed (gastric tip)  Pt out of room at time of RD visit. PMT has been following. Per NP, family would like to watch pt's progress over the next couple days while awaiting repeat scan and allowing time for the pt to be able to declare himself. Tentative plan for SNF and PEG placement if pt continues to be awake, interactive or with further signs of improvement. However, if pt's mental status declines, family would like to [place] [facility] [place] [facility] [place] [facility].    Pt remains NPO and is tolerating TF via Cortrak per RN. Current TF regimen: Osmolite 1.5 @ 55ml71m 45ml 67mource TF BID, 175ml f54mwater Q4H. Discussed regimen with MD -- plan to discontinue free water flushes per Neurology recommendations to minimize risk of cerebral edema. MD monitoring BMP, Cr, Na to monitor need for IVF.  UOP: 250mL do64mnted x24 hours I/O: +5.6L since admit  Medications: nutrisource fiber daily, SSI Labs: BUN 41 (H), Cr 1.31 (H) CBGs: 130-126-802-233-612rder:   Diet Order             Diet NPO time specified  Diet effective now                   EDUCATION NEEDS:  No education needs have been identified at this time  Skin:  Skin Assessment: Reviewed RN Assessment  Last BM:  8/24 type 6  Height:  Ht Readings from Last 1 Encounters:  10/14/20 5' 5" (1.651 m)   Weight:  Wt Readings from Last 1 Encounters:  10/28/20 87.4 kg   Ideal Body Weight:  61.82 kg  BMI:  Body mass index is 32.06 kg/m.  Estimated Nutritional Needs:  Kcal:  2000-2200 Protein:  100-120 grams Fluid:  >2L    Rodney Wigger ALarkin Ina, LDN (she/her/hers) RD pager number and weekend/on-call pager number located in [email or web address]

## 2020-10-28 NOTE — Progress Notes (Signed)
Patient ID: Clifford [name], male   DOB: February 05, 1951, 70 y.o.   MRN: [id number] STROKE TEAM PROGRESS NOTE     SUBJECTIVE (INTERVAL HISTORY) Stroke team was called back to evaluate patient because of concerns about neurological worsening on the MRI scan done today.  Patient received 1 mg of Ativan at 10 AM today and has remained lethargic and barely r responsive since then.  Repeat MRI scan of the brain done today shows progressive swelling in the right hemisphere the posterior lateral temporal and temporoparietal region with minimal petechial blood products but no frank hematoma.  There are small tiny acute infarcts anterior to that and inferior basal ganglia which appear new from previous MRI from 10/14/2020 and there is 40 mm right-to-left midline shift.  Vital signs are stable..  Patient had been switched to full dose Lovenox for treatment for his DVT and was also getting free water through his cortrack tube.  Serum sodium this morning was 141 OBJECTIVE Temp:  [98.5 F (36.9 C)-99 F (37.2 C)] 99 F (37.2 C) (08/25 1110) Pulse Rate:  [56-78] 56 (08/25 1110) Cardiac Rhythm: Sinus bradycardia (08/25 0807) Resp:  [18-20] 18 (08/25 1110) BP: (124-160)/(53-100) 124/70 (08/25 1110) SpO2:  [97 %-100 %] 98 % (08/25 1110) Weight:  [87.4 kg] 87.4 kg (08/25 0454)  Recent Labs  Lab 10/27/20 2023 10/28/20 0004 10/28/20 0326 10/28/20 0804 10/28/20 1207  GLUCAP 136* 93 130* 126* 132*   Recent Labs  Lab 10/22/20 0509 10/22/20 1719 10/23/20 0437 10/23/20 1645 10/25/20 0030 10/26/20 0251 10/28/20 1121  NA 137  --   --   --  135 136 141  K 4.1  --   --   --  5.7* 4.3 4.3  CL 105  --   --   --  105 105 107  CO2 23  --   --   --  20* 23 26  GLUCOSE 103*  --   --   --  122* 112* 114*  BUN 19  --   --   --  30* 33* 41*  CREATININE 1.31*  --   --   --  1.16 1.24 1.31*  CALCIUM 8.9  --   --   --  8.8* 9.0 9.1  MG 2.1 2.2 2.3 2.2  --   --   --   PHOS 3.4 3.0 3.0 2.7  --   --   --    No results for  input(s): AST, ALT, ALKPHOS, BILITOT, PROT, ALBUMIN in the last 168 hours.  Recent Labs  Lab 10/25/20 0030 10/26/20 0251 10/28/20 1121  WBC 5.7 5.8 4.9  HGB 13.1 12.1* 11.5*  HCT 41.1 38.6* 36.9*  MCV 81.2 82.0 82.2  PLT 178 145* 130*   No results for input(s): CKTOTAL, CKMB, CKMBINDEX, TROPONINI in the last 168 hours. No results for input(s): LABPROT, INR in the last 72 hours.  No results for input(s): COLORURINE, LABSPEC, [place], GLUCOSEU, HGBUR, BILIRUBINUR, KETONESUR, PROTEINUR, UROBILINOGEN, NITRITE, LEUKOCYTESUR in the last 72 hours.  Invalid input(s): APPERANCEUR     Component Value Date/Time   CHOL 128 10/07/2020 0450   TRIG 63 10/07/2020 0450   HDL 40 (L) 10/07/2020 0450   CHOLHDL 3.2 10/07/2020 0450   VLDL 13 10/07/2020 0450   LDLCALC 75 10/07/2020 0450   Lab Results  Component Value Date   HGBA1C 5.9 (H) 10/07/2020   No results found for: LABOPIA, COCAINSCRNUR, LABBENZ, AMPHETMU, THCU, LABBARB  No results for input(s): ETH in the last 168  hours.  I have personally reviewed the radiological images below and agree with the radiology interpretations.  CT ABDOMEN WO CONTRAST  Result Date: 10/28/2020 CLINICAL DATA:  History of stroke. Evaluate anatomy for gastrostomy tube placement. EXAM: CT ABDOMEN WITHOUT CONTRAST TECHNIQUE: Multidetector CT imaging of the abdomen was performed following the standard protocol without IV contrast. COMPARISON:  None. FINDINGS: Lower chest: 8 mm round nodule along the periphery of the left upper lobe on sequence 5 image 4. Pleural-based densities in the left lower lobe could represent atelectasis or scarring. No large pleural effusions. Hepatobiliary: 1.2 cm low-density structure along the hepatic dome is likely an incidental finding such as a cyst. Normal appearance of the gallbladder. Pancreas: Unremarkable. No pancreatic ductal dilatation or surrounding inflammatory changes. Spleen: Normal in size without focal abnormality.  Adrenals/Urinary Tract: Normal adrenal glands. Exophytic low-density structure in the anterior right kidney probably represents a cyst. Significant motion artifact in the kidneys. There is an additional cyst in the right kidney lower pole measuring up to 3.7 cm. Peripheral calcifications along the lateral aspect of the left kidney that are poorly characterized due to motion artifact. Negative for hydronephrosis. Stomach/Bowel: Currently, the transverse colon is anterior to the distal stomach body. There is a feeding tube that extends into the stomach and proximal duodenum. There is a very small percutaneous window between the transverse colon and the splenic flexure for percutaneous gastrostomy tube access. No evidence for bowel inflammation or obstruction. Vascular/Lymphatic: Diffuse atherosclerotic calcifications in the aorta and iliac arteries without an aortic aneurysm. No significant lymph node enlargement in the abdomen. Prominent coronary artery calcifications in the LAD distribution. Other: Negative for ascites. Musculoskeletal: Poorly defined sclerosis in the L3 vertebral body is nonspecific. Degenerative facet disease in lower lumbar spine. Poorly defined sclerosis involving the posterior aspect of S1. IMPRESSION: 1. Small percutaneous window for gastrostomy tube placement between the transverse colon and the splenic flexure as described. Anatomy is amendable for percutaneous gastrostomy tube placement but consider visualization of the transverse colon during gastrostomy tube placement. 2. **An incidental finding of potential clinical significance has been found. Indeterminate 8 mm nodule in the left upper lobe. No comparison imaging to evaluate for stability. Non-contrast chest CT at 6-12 months is recommended. If the nodule is stable at time of repeat CT, then future CT at 18-24 months (from today's scan) is considered optional for low-risk patients, but is recommended for high-risk patients. This  recommendation follows the consensus statement: Guidelines for Management of Incidental Pulmonary Nodules Detected on CT Images: From the Fleischner Society 2017; Radiology 2017; 284:228-243.** 3. Indeterminate sclerosis involving the L3 vertebral body. Consider correlating with PSA level. 4. Indeterminate calcifications along the periphery of the left kidney. This area is very poorly characterized due to excessive motion artifact. This could be related to old hemorrhage, trauma or even previous surgery. Electronically Signed   By: Markus Daft M.D.   On: 10/28/2020 08:05   CT Angio Head W or Wo Contrast  Result Date: 10/06/2020 CLINICAL DATA:  Stroke. TIA. Assess intracranial arteries. Unsteady gait. Left-sided weakness. Left-sided visual loss. Headache. EXAM: CT ANGIOGRAPHY HEAD AND NECK TECHNIQUE: Multidetector CT imaging of the head and neck was performed using the standard protocol during bolus administration of intravenous contrast. Multiplanar CT image reconstructions and MIPs were obtained to evaluate the vascular anatomy. Carotid stenosis measurements (when applicable) are obtained utilizing NASCET criteria, using the distal internal carotid diameter as the denominator. CONTRAST:  131m OMNIPAQUE IOHEXOL 350 MG/ML SOLN COMPARISON:  Head  CT earlier same day FINDINGS: CTA NECK FINDINGS Aortic arch: Aortic atherosclerosis.  Branching pattern is normal. Right carotid system: Common carotid artery is tortuous but widely patent to the bifurcation. Carotid bifurcation is widely patent. Minimal calcified plaque at the distal ICA bulb but no stenosis. Cervical ICA widely patent. Left carotid system: Common carotid artery is tortuous but widely patent to the bifurcation. Carotid bifurcation is normal. Minimal calcified plaque at the ICA bulb but no stenosis. Cervical ICA widely patent beyond that. Vertebral arteries: Right vertebral artery origin is widely patent. Left vertebral artery origin is poorly seen because  of regional venous reflux. Both vertebral arteries do appear patent through the cervical region to the foramen magnum. Skeleton: Ordinary cervical spondylosis. Other neck: No mass or lymphadenopathy. Upper chest: Upper lungs are clear. Review of the MIP images confirms the above findings CTA HEAD FINDINGS Anterior circulation: Both internal carotid arteries are patent through the skull base and siphon regions. There is ordinary siphon atherosclerotic calcification. On the left, there is no stenosis. On the right, there is 50-70% stenosis in the siphon. Supraclinoid internal carotid arteries are widely patent. The anterior and middle cerebral vessels are patent. No large vessel occlusion, with particular attention to the right MCA. Posterior circulation: Both vertebral arteries are patent through the foramen magnum to the basilar. No basilar stenosis. Posterior circulation branch vessels are normal. Venous sinuses: Patent and normal. Anatomic variants: None significant. Review of the MIP images confirms the above findings IMPRESSION: No intracranial large vessel occlusion identified at this time, with specific attention to the right MCA branches. Minimal atherosclerotic change at both internal carotid artery bulbs. No stenosis. 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 No stenosis on the left. 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 Electronically Signed   By: Nelson Chimes M.D.   On: 10/06/2020 15:01   CT HEAD WO CONTRAST (5MM)  Result Date: 10/19/2020 CLINICAL DATA:  Follow-up stroke with altered mental status, initial encounter EXAM: CT HEAD WITHOUT CONTRAST TECHNIQUE: Contiguous axial images were obtained from the base of the skull through the vertex without intravenous contrast. COMPARISON:  CT from earlier in the same day. FINDINGS: Brain: There are again changes of edema and decreased attenuation in the distribution of the right posterior middle cerebral artery consistent with prior  infarct. There is again noted some effacement of the right lateral ventricle as well as mild midline shift from right to left of approximately 4 mm. No acute hemorrhage is seen. No new infarct is noted. Vascular: No hyperdense vessel or unexpected calcification. Skull: Normal. Negative for fracture or focal lesion. Sinuses/Orbits: No acute finding. Other: None. IMPRESSION: Evolving infarct involving the posterior aspect of the right middle cerebral artery. Persistent mass effect upon the right lateral ventricle is noted with mild midline shift. No new focal abnormality is seen. No herniation is seen. Electronically Signed   By: Inez Catalina M.D.   On: 10/19/2020 19:28   CT HEAD WO CONTRAST (5MM)  Result Date: 10/19/2020 CLINICAL DATA:  Stroke follow-up EXAM: CT HEAD WITHOUT CONTRAST TECHNIQUE: Contiguous axial images were obtained from the base of the skull through the vertex without intravenous contrast. COMPARISON:  None. FINDINGS: Brain: Evolving right posterior MCA territory infarct with increased area of cytotoxic edema now involving a greater portion of the temporal lobe. There is moderate mass effect on the right lateral ventricle. No hydrocephalus or entrapment. There is now leftward midline shift approximately 3 mm but no herniation. No acute hemorrhage. Vascular: Atherosclerotic  calcification of the internal carotid arteries at the skull base. No abnormal hyperdensity of the major intracranial arteries or dural venous sinuses. Skull: The visualized skull base, calvarium and extracranial soft tissues are normal. Sinuses/Orbits: No fluid levels or advanced mucosal thickening of the visualized paranasal sinuses. No mastoid or middle ear effusion. The orbits are normal. IMPRESSION: 1. Evolving right posterior MCA territory infarct with increased area of cytotoxic edema now involving a greater portion of the temporal lobe. 2. No acute hemorrhage. 3. Moderate mass effect on the right lateral ventricle with 3  mm leftward midline shift but no herniation. Electronically Signed   By: Ulyses Jarred M.D.   On: 10/19/2020 03:11   CT HEAD WO CONTRAST (5MM)  Result Date: 10/15/2020 CLINICAL DATA:  Neuro deficit, acute, stroke suspected. New focal defects. EXAM: CT HEAD WITHOUT CONTRAST TECHNIQUE: Contiguous axial images were obtained from the base of the skull through the vertex without intravenous contrast. COMPARISON:  Head CT and MRI yesterday. FINDINGS: Brain: Confluent acute infarction in the right posterior temporal and temporoparietal junction regions shows discrete low-density by CT, with minimal petechial blood products but no frank hematoma. Smaller areas cortical and subcortical infarction in the right frontal and frontoparietal cortex and underlying white matter are seen by CT is subtle findings. No hemorrhage in that region. No new insult is identified. Mild swelling but no significant mass effect or any midline shift. No hydrocephalus. No extra-axial collection. Vascular: There is atherosclerotic calcification of the major vessels at the base of the brain. Skull: Negative Sinuses/Orbits: Clear/normal Other: None IMPRESSION: No apparent change since yesterday. Confluent infarction at the right posterior temporal and temporoparietal junction region with petechial blood products but no frank hematoma. Smaller more subtle infarctions of the cortical and subcortical brain in the right frontal and frontoparietal region appear unchanged. Electronically Signed   By: Nelson Chimes M.D.   On: 10/15/2020 15:16   CT HEAD WO CONTRAST (5MM)  Result Date: 10/14/2020 CLINICAL DATA:  Right-sided headache, recent stroke EXAM: CT HEAD WITHOUT CONTRAST TECHNIQUE: Contiguous axial images were obtained from the base of the skull through the vertex without intravenous contrast. COMPARISON:  10/06/2020 CT head, 10/07/2020 MRI head FINDINGS: Brain: Area of hypodensity in the right parietal and occipital lobes, which correlates with  the area of infarct seen on the 10/06/2020 CT and 10/07/2020 MRI, with edema in this area. Subtle areas of hyperdensity, likely petechial hemorrhage. No evidence of hemorrhagic transformation. No new areas of infarction. No significant midline shift. No mass or extra-axial collection. Vascular: No hyperdense vessel or unexpected calcification. Skull: Normal. Negative for fracture or focal lesion. Sinuses/Orbits: Mucosal thickening in the left frontal sinus and bilateral ethmoid air cells. The orbits are unremarkable. Other: None. IMPRESSION: Expected evolution of the previously noted right MCA/PCA territory infarct, with petechial hemorrhage and mild edema, without midline shift or hemorrhagic transformation. Electronically Signed   By: Merilyn Baba MD   On: 10/14/2020 10:02   CT HEAD WO CONTRAST (5MM)  Result Date: 10/06/2020 CLINICAL DATA:  Neuro deficit, acute, stroke suspected. Steady gait, left-sided weakness and left-sided vision loss, increased blood pressure, headache for 3 days, history of hypertension. EXAM: CT HEAD WITHOUT CONTRAST TECHNIQUE: Contiguous axial images were obtained from the base of the skull through the vertex without intravenous contrast. COMPARISON:  No pertinent prior exams available for comparison. FINDINGS: Brain: Cerebral volume is normal for age. Region of abnormal cortical/subcortical hypodensity measuring 6.3 x 3.3 x 5.8 cm within the right parietooccipital lobes compatible  with acute/early subacute infarction (right PCA vascular territory and right MCA/PCA watershed territory). No significant mass effect at this time. No evidence of hemorrhagic conversion. Chronic appearing lacunar infarct within the left lentiform nucleus. No extra-axial fluid collection. No evidence of an intracranial mass. No midline shift. Vascular: No hyperdense vessel.  Atherosclerotic calcifications. Skull: Normal. Negative for fracture or focal lesion. Sinuses/Orbits: Visualized orbits show no acute  finding. Mild mucosal thickening within the frontal sinuses bilaterally. Mild-to-moderate mucosal thickening within the bilateral ethmoid air cells. These results were discussed by telephone at the time of interpretation on 10/06/2020 at 1:55 pm with provider MATTHEW TRIFAN , who verbally acknowledged these results. IMPRESSION: 6.3 x 3.3 x 5.8 cm acute/early subacute cortical and subcortical infarct within the right parietooccipital lobes (right PCA vascular territory and right MCA/PCA watershed territory). Consider MR or CT angiography for further evaluation. Chronic left basal ganglia lacunar infarct. Paranasal sinus disease at the imaged levels, as described. Electronically Signed   By: Kellie Simmering DO   On: 10/06/2020 14:13   CT Angio Neck W and/or Wo Contrast  Result Date: 10/06/2020 CLINICAL DATA:  Stroke. TIA. Assess intracranial arteries. Unsteady gait. Left-sided weakness. Left-sided visual loss. Headache. EXAM: CT ANGIOGRAPHY HEAD AND NECK TECHNIQUE: Multidetector CT imaging of the head and neck was performed using the standard protocol during bolus administration of intravenous contrast. Multiplanar CT image reconstructions and MIPs were obtained to evaluate the vascular anatomy. Carotid stenosis measurements (when applicable) are obtained utilizing NASCET criteria, using the distal internal carotid diameter as the denominator. CONTRAST:  169m OMNIPAQUE IOHEXOL 350 MG/ML SOLN COMPARISON:  Head CT earlier same day FINDINGS: CTA NECK FINDINGS Aortic arch: Aortic atherosclerosis.  Branching pattern is normal. Right carotid system: Common carotid artery is tortuous but widely patent to the bifurcation. Carotid bifurcation is widely patent. Minimal calcified plaque at the distal ICA bulb but no stenosis. Cervical ICA widely patent. Left carotid system: Common carotid artery is tortuous but widely patent to the bifurcation. Carotid bifurcation is normal. Minimal calcified plaque at the ICA bulb but no  stenosis. Cervical ICA widely patent beyond that. Vertebral arteries: Right vertebral artery origin is widely patent. Left vertebral artery origin is poorly seen because of regional venous reflux. Both vertebral arteries do appear patent through the cervical region to the foramen magnum. Skeleton: Ordinary cervical spondylosis. Other neck: No mass or lymphadenopathy. Upper chest: Upper lungs are clear. Review of the MIP images confirms the above findings CTA HEAD FINDINGS Anterior circulation: Both internal carotid arteries are patent through the skull base and siphon regions. There is ordinary siphon atherosclerotic calcification. On the left, there is no stenosis. On the right, there is 50-70% stenosis in the siphon. Supraclinoid internal carotid arteries are widely patent. The anterior and middle cerebral vessels are patent. No large vessel occlusion, with particular attention to the right MCA. Posterior circulation: Both vertebral arteries are patent through the foramen magnum to the basilar. No basilar stenosis. Posterior circulation branch vessels are normal. Venous sinuses: Patent and normal. Anatomic variants: None significant. Review of the MIP images confirms the above findings IMPRESSION: No intracranial large vessel occlusion identified at this time, with specific attention to the right MCA branches. Minimal atherosclerotic change at both internal carotid artery bulbs. No stenosis. 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 No stenosis on the left. 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 Electronically Signed   By: MNelson ChimesM.D.   On: 10/06/2020 15:01   MR BRAIN WO CONTRAST  Result Date: 10/28/2020 CLINICAL DATA:  Follow-up right brain stroke. EXAM: MRI HEAD WITHOUT CONTRAST TECHNIQUE: Multiplanar, multiecho pulse sequences of the brain and surrounding structures were obtained without intravenous contrast. COMPARISON:  Head CT 10/19/2020.  MRI 10/14/2020. FINDINGS: Brain: The  study suffers from considerable motion degradation. No acute or focal finding affects the brainstem or cerebellum. Acute infarction in the right parietal and parieto-occipital brain with involvement of the posterolateral temporal lobe remains evident, consistent with inferior division MCA occlusion. The region of infarction shows increased swelling since the previous examinations. Minimal petechial blood products are present. No frank hematoma. Small foci of acute infarction seen anterior to the large infarction within the right posterior frontal cortical and subcortical brain. Small foci acute infarction of the inferior basal ganglia which are newly seen since the study of 10/14/2020. Swelling and mass effect with right-to-left shift of 14 mm. Threatening uncal herniation on the right. No hydrocephalus. No extra-axial collection. Old white matter infarctions of the left hemisphere as seen previously. Vascular: Major vessels at the base of the brain show flow. Skull and upper cervical spine: Negative Sinuses/Orbits: Clear/normal Other: None IMPRESSION: Progressive swelling in the right hemisphere infarction affecting the posterolateral temporal lobe, parieto-occipital junction and parietal region. Minimal petechial blood products, not increased since the previous study. No frank hematoma. Small acute infarctions anterior to that within the posterior frontal cortical and subcortical brain. Small acute infarctions at the inferior basal ganglia on the right, newly seen since the study of 10/14/2020. Increased swelling and mass effect with right-to-left shift of 14 mm and threatening uncal herniation. Electronically Signed   By: Nelson Chimes M.D.   On: 10/28/2020 12:41   MR BRAIN WO CONTRAST  Result Date: 10/14/2020 CLINICAL DATA:  Stroke, follow up.  Eval for progression of stroke. EXAM: MRI HEAD WITHOUT CONTRAST TECHNIQUE: Multiplanar, multiecho pulse sequences of the brain and surrounding structures were obtained  without intravenous contrast. COMPARISON:  Head CT 10/14/2020 and MRI 10/07/2020 FINDINGS: The study is intermittently motion degraded including severe motion on the coronal T2 sequence. Brain: The moderately large infarct involving the posterior right MCA territory and PCA border zone has mildly expanded compared to the prior MRI, most notable anteriorly and superiorly. Associated petechial hemorrhage is partially confluent and has increased from the prior MRI. There is progressive cytotoxic edema with regional sulcal effacement and mass effect on the right lateral ventricle. New mildly restricted diffusion in the splenium of the corpus callosum may reflect acute wallerian degeneration. Small cortical and subcortical infarcts in the right frontal lobe (border zone distribution) have also mildly increased from the prior MRI. Small T2 hyperintensities elsewhere in the cerebral white matter bilaterally are similar to the prior MRI and are nonspecific but compatible with mild chronic small vessel ischemic disease. There is no midline shift or extra-axial fluid collection. A chronic hemorrhagic infarct is again noted in the posterior aspect of the left lentiform nucleus/external capsule. Vascular: Major intracranial vascular flow voids are preserved. Skull and upper cervical spine: Unremarkable bone marrow signal para Sinuses/Orbits: Unremarkable orbits. Mild mucosal thickening in the paranasal sinuses. Clear mastoid air cells. Other: None. IMPRESSION: 1. Mild enlargement of the right MCA/border zone infarct since the 10/07/2020 MRI. Increased cytotoxic edema and petechial hemorrhage without midline shift. 2. Chronic small-vessel ischemia including an old hemorrhagic infarct in the left basal ganglia. Electronically Signed   By: Logan Bores M.D.   On: 10/14/2020 18:22   MR BRAIN WO CONTRAST  Result Date: 10/07/2020 CLINICAL DATA:  Neuro deficit, acute, stroke suspected. Additional history provided: Vision change and  mild left-sided weakness which began 2 days ago. EXAM: MRI HEAD WITHOUT CONTRAST TECHNIQUE: Multiplanar, multiecho pulse sequences of the brain and surrounding structures were obtained without intravenous contrast. COMPARISON:  Noncontrast head CT 10/06/2020. CT angiogram head/neck 10/06/2020. FINDINGS: Brain: Cerebral volume is normal. Region of abnormal cortical/subcortical restricted diffusion within the right parietal and occipital lobes measuring 6.2 x 3.3 x 6.4 cm compatible with acute/early subacute infarction (at the junction of the right MCA and PCA vascular territories). This has not significantly changed in extent as compared to the head CT performed yesterday 10/06/2020. Mild T1 hyperintensity and SWI signal loss within the infarction territory compatible with petechial hemorrhage and possibly cortical laminar necrosis. No significant mass effect at this time. There are numerous additional small patchy cortical and subcortical acute/early subacute infarcts within the right frontal and parietal lobes (watershed distribution). Background mild multifocal T2/FLAIR hyperintensity within the cerebral white matter, nonspecific but compatible with chronic small vessel ischemic disease. Chronic lacunar infarct with associated chronic hemosiderin deposition within the left basal ganglia/posterior limb of left internal capsule. No evidence of an intracranial mass. No extra-axial fluid collection. No midline shift. Vascular: Maintained proximal arterial flow voids. Skull and upper cervical spine: No focal suspicious marrow lesion. Incompletely assessed cervical spondylosis. Sinuses/Orbits: Visualized orbits show no acute finding. 11 mm left maxillary sinus mucous retention cyst. Moderate bilateral ethmoid sinus mucosal thickening. Trace mucosal thickening within the left frontal sinus. IMPRESSION: 6.2 x 6.4 cm acute/early subacute cortical and subcortical infarction within the right parietal and occipital lobes (at  the junction of the right MCA and PCA territories). The infarct has not significantly changed in extent as compared to the head CT of 10/06/2020. Mild petechial hemorrhage within the infarction territory. No significant mass effect at this time. Numerous additional small patchy cortical and subcortical acute/early subacute infarcts within the right frontal and parietal lobes (watershed distribution). 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 Background mild chronic small vessel ischemic changes within the cerebral white matter. Paranasal sinus disease at the imaged levels, as described. Electronically Signed   By: Kellie Simmering DO   On: 10/07/2020 07:57   DG Abd Portable 1V  Result Date: 10/22/2020 CLINICAL DATA:  Encounter for NG tube EXAM: PORTABLE ABDOMEN - 1 VIEW COMPARISON:  None. FINDINGS: There is a feeding tube with tip overlying the distal stomach. There are some prominent loops of small bowel but there is no evidence of obstruction. Multilevel degenerative changes of the spine. IMPRESSION: Feeding tube tip overlies the region of the distal stomach. Electronically Signed   By: Maurine Simmering M.D.   On: 10/22/2020 13:00   DG Swallowing Func-Speech Pathology  Result Date: 10/21/2020 Table formatting from the original result was not included. Objective Swallowing Evaluation: Type of Study: MBS-Modified Barium Swallow Study  Patient Details Name: JAQUARRIUS TRISTAN MRN: [id number] Date of Birth: 11-15-1950 Today's Date: 10/21/2020 Time: SLP Start Time (ACUTE ONLY): 0900 -SLP Stop Time (ACUTE ONLY): 0910 SLP Time Calculation (min) (ACUTE ONLY): 10 min Past Medical History: Past Medical History: Diagnosis Date  BPH (benign prostatic hyperplasia)   Chronic anticoagulation 10/14/2020  Essential hypertension 10/06/2020  History of pulmonary embolism 10/14/2020  HLD (hyperlipidemia)   HTN (hypertension)   Lung nodule   87m in 2015  Malignant neoplasm of left  kidney ([place]   Mixed hyperlipidemia 10/06/2020  Nicotine dependence, cigarettes, uncomplicated [id number] Peripheral vascular disease (  [place])   Polyp of colon   Prediabetes 10/14/2020  Pulmonary embolus ([place])   Stroke ([place]) 10/06/2020 Past Surgical History: Past Surgical History: Procedure Laterality Date  COLONOSCOPY  03/2017  PARTIAL NEPHRECTOMY Left 02/2018  PROSTATE BIOPSY  2015 HPI: 70yo male admitted 10/14/20 with right side headache and LUE weakness. 10/19/20 pt was noted to have increased LUE weakness. 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 CTHead = evolving RMCA infarct with cytotoxic edema, mass effect  Subjective: alert, requires cueing Assessment / Plan / Recommendation CHL IP CLINICAL IMPRESSIONS 10/21/2020 Clinical Impression Pt unfortunately with very limited MBSS. Pt with eyes open during exam, responding some to simple commands but exhibited significant cognitive deficits impairing ability for swallow sequencing. Orally pt with persistent left sided oral motor deficits post CVA that contributed to left anterior spillage, oral holding of bolus, poor bolus cohesion, with minimal to no bolus manipulation. Despite multimodal cues pt was unable to propel any of tested consistencies to pharynx. Contents spilled from oral cavity and remainder was cleaned by SLP with oral care post exam. Attempted nectar thick via tsp and cup, and puree textures. Per chart review, this appears consistent with documented concern from nursing yesterday evening. Recommend NPO with consideration of short term means of alternative nutrition, continued SLP intervention with hopes of PO diet reinitiation as pt clinically tolerating. SLP notified MD and RN of recommendations and will continue to closely follow. SLP Visit Diagnosis Dysphagia, oral phase (R13.11) Attention and concentration deficit following -- Frontal lobe and executive function deficit following --  Impact on safety and function Moderate aspiration risk;Severe aspiration risk;Risk for inadequate nutrition/hydration   CHL IP TREATMENT RECOMMENDATION 10/21/2020 Treatment Recommendations Therapy as outlined in treatment plan below   Prognosis 10/21/2020 Prognosis for Safe Diet Advancement Fair Barriers to Reach Goals Cognitive deficits;Severity of deficits Barriers/Prognosis Comment -- [facility] IP DIET RECOMMENDATION 10/21/2020 SLP Diet Recommendations NPO;Alternative means - temporary Liquid Administration via -- Medication Administration Via alternative means Compensations -- Postural Changes --   CH[facility] IP OTHER RECOMMENDATIONS 10/21/2020 Recommended Consults -- Oral Care Recommendations Oral care QID Other Recommendations --   CH[facility] IP FOLLOW UP RECOMMENDATIONS 10/21/2020 Follow up Recommendations 24 hour supervision/assistance;Skilled Nursing facility   [facility] IP FREQUENCY AND DURATION 10/21/2020 Speech Therapy Frequency (ACUTE ONLY) min 3x week Treatment Duration 2 weeks      CHL IP ORAL PHASE 10/21/2020 Oral Phase Impaired Oral - Pudding Teaspoon -- Oral - Pudding Cup -- Oral - Honey Teaspoon -- Oral - Honey Cup -- Oral - Nectar Teaspoon Left anterior bolus loss;Weak lingual manipulation;Reduced posterior propulsion;Holding of bolus;Pocketing in anterior sulcus;Lingual/palatal residue;Delayed oral transit;Decreased bolus cohesion Oral - Nectar Cup Left anterior bolus loss;Weak lingual manipulation;Reduced posterior propulsion;Holding of bolus;Left pocketing in lateral sulci;Pocketing in anterior sulcus;Lingual/palatal residue;Delayed oral transit Oral - Nectar Straw -- Oral - Thin Teaspoon -- Oral - Thin Cup -- Oral - Thin Straw -- Oral - Puree Reduced posterior propulsion;Holding of bolus;Left pocketing in lateral sulci;Lingual/palatal residue;Delayed oral transit;Decreased bolus cohesion Oral - Mech Soft -- Oral - Regular -- Oral - Multi-Consistency -- Oral - Pill -- Oral Phase - Comment --  CHL IP PHARYNGEAL PHASE 10/21/2020  Pharyngeal Phase Impaired Pharyngeal- Pudding Teaspoon -- Pharyngeal -- Pharyngeal- Pudding Cup -- Pharyngeal -- Pharyngeal- Honey Teaspoon -- Pharyngeal -- Pharyngeal- Honey Cup -- Pharyngeal -- Pharyngeal- Nectar Teaspoon Other (Comment) Pharyngeal -- Pharyngeal- Nectar Cup Other (Comment) Pharyngeal -- Pharyngeal- Nectar Straw -- Pharyngeal --  Pharyngeal- Thin Teaspoon -- Pharyngeal -- Pharyngeal- Thin Cup -- Pharyngeal -- Pharyngeal- Thin Straw -- Pharyngeal -- Pharyngeal- Puree Other (Comment) Pharyngeal -- Pharyngeal- Mechanical Soft -- Pharyngeal -- Pharyngeal- Regular -- Pharyngeal -- Pharyngeal- Multi-consistency -- Pharyngeal -- Pharyngeal- Pill -- Pharyngeal -- Pharyngeal Comment --  No flowsheet data found. [place] MA, CCC-SLP 10/21/2020, 9:35 AM              EEG adult  Result Date: 10/15/2020 Lora Havens, MD     10/15/2020 10:31 AM Patient Name: EKANSH DITTER MRN: [id number] Epilepsy Attending: Lora Havens Referring Physician/Provider: Dr Lesleigh Noe Date: 10/15/2020 Duration: 23.02 mins Patient history: 70 year old male with posterior right parietal watershed infarct who presented with worsening stroke symptoms as well as left thumb rhythmic and nonsuppressible shaking movements.  EEG to evaluate for seizures. Level of alertness: Awake, asleep AEDs during EEG study: LEV Technical aspects: This EEG study was done with scalp electrodes positioned according to the 10-20 International system of electrode placement. Electrical activity was acquired at a sampling rate of '500Hz'$  and reviewed with a high frequency filter of '70Hz'$  and a low frequency filter of '1Hz'$ . EEG data were recorded continuously and digitally stored. Description: The posterior dominant rhythm consists of 8-9 Hz activity of moderate voltage (25-35 uV) seen predominantly in posterior head regions, asymmetric ( R<L) and reactive to eye opening and eye closing. Sleep was characterized by vertex waves, sleep spindles (12  to 14 Hz), maximal frontocentral region.  EEG showed continuous rhythmic 3 to 5 Hz theta-delta slowing in right hemisphere. Hyperventilation and photic stimulation were not performed.   ABNORMALITY - Continuous slow, right hemisphere - Background asymmetry, right<left IMPRESSION: This study is suggestive of cortical dysfunction in right hemisphere, likely secondary to underlying stroke.  No seizures or epileptiform discharges were seen throughout the recording. Priyanka Barbra Sarks   Overnight EEG with video  Result Date: 10/16/2020 Lora Havens, MD     10/16/2020  3:23 PM Patient Name: LOWE MCBRATNEY MRN: [id number] Epilepsy Attending: Lora Havens Referring Physician/Provider: Dr Rosalin Hawking Duration: 10/15/2020 1639 to 10/16/2020 0230  Patient history: 70 year old male with posterior right parietal watershed infarct who presented with worsening stroke symptoms as well as left thumb rhythmic and nonsuppressible shaking movements.  EEG to evaluate for seizures.  Level of alertness: Awake, asleep  AEDs during EEG study: LEV  Technical aspects: This EEG study was done with scalp electrodes positioned according to the 10-20 International system of electrode placement. Electrical activity was acquired at a sampling rate of '500Hz'$  and reviewed with a high frequency filter of '70Hz'$  and a low frequency filter of '1Hz'$ . EEG data were recorded continuously and digitally stored.  Description: The posterior dominant rhythm consists of 8-9 Hz activity of moderate voltage (25-35 uV) seen predominantly in posterior head regions, asymmetric ( R<L) and reactive to eye opening and eye closing. Sleep was characterized by vertex waves, sleep spindles (12 to 14 Hz), maximal frontocentral region.  EEG showed continuous rhythmic 3 to 5 Hz theta-delta slowing in right hemisphere. Hyperventilation and photic stimulation were not performed. EEG was not interpretable after 0230 on 10/16/2020 due to significant electrode artifact.   ABNORMALITY  - Continuous slow, right hemisphere - Background asymmetry, right<left  IMPRESSION: This study is suggestive of cortical dysfunction in right hemisphere, likely secondary to underlying stroke.  No seizures or epileptiform discharges were seen throughout the recording.  Lora Havens   ECHOCARDIOGRAM COMPLETE  Result Date: 10/07/2020  ECHOCARDIOGRAM REPORT   Patient Name:   BABAK LACK Date of Exam: 10/07/2020 Medical Rec #:  [id number]    Height:       65.0 in Accession #:    [id number]   Weight: Date of Birth:  07-27-1950     BSA: Patient Age:    49 years     BP:           115/58 mmHg Patient Gender: M            HR:           66 bpm. Exam Location:  Inpatient Procedure: 2D Echo, Cardiac Doppler and Color Doppler Indications:    Stroke I63.9  History:        Patient has no prior history of Echocardiogram examinations.                 Risk Factors:Hypertension and Dyslipidemia. PVD. PE.  Sonographer:    Vickie Epley RDCS Referring Phys: [place]  1. Left ventricular ejection fraction, by estimation, is 65 to 70%. The left ventricle has hyperdynamic function. The left ventricle has no regional wall motion abnormalities. Left ventricular diastolic parameters are consistent with Grade I diastolic dysfunction (impaired relaxation).  2. Right ventricular systolic function is normal. The right ventricular size is normal. Tricuspid regurgitation signal is inadequate for assessing PA pressure.  3. The mitral valve is normal in structure. No evidence of mitral valve regurgitation. No evidence of mitral stenosis.  4. The aortic valve is tricuspid. Aortic valve regurgitation is not visualized. No aortic stenosis is present.  5. The inferior vena cava is normal in size with greater than 50% respiratory variability, suggesting right atrial pressure of 3 mmHg. FINDINGS  Left Ventricle: Left ventricular ejection fraction, by estimation, is 65 to 70%. The left ventricle has hyperdynamic function. The left  ventricle has no regional wall motion abnormalities. The left ventricular internal cavity size was normal in size. There is no left ventricular hypertrophy. Left ventricular diastolic parameters are consistent with Grade I diastolic dysfunction (impaired relaxation). Right Ventricle: The right ventricular size is normal. No increase in right ventricular wall thickness. Right ventricular systolic function is normal. Tricuspid regurgitation signal is inadequate for assessing PA pressure. Left Atrium: Left atrial size was normal in size. Right Atrium: Right atrial size was normal in size. Pericardium: There is no evidence of pericardial effusion. Mitral Valve: The mitral valve is normal in structure. No evidence of mitral valve regurgitation. No evidence of mitral valve stenosis. Tricuspid Valve: The tricuspid valve is normal in structure. Tricuspid valve regurgitation is not demonstrated. Aortic Valve: The aortic valve is tricuspid. Aortic valve regurgitation is not visualized. No aortic stenosis is present. Pulmonic Valve: The pulmonic valve was normal in structure. Pulmonic valve regurgitation is not visualized. Aorta: The aortic root is normal in size and structure. Venous: The inferior vena cava is normal in size with greater than 50% respiratory variability, suggesting right atrial pressure of 3 mmHg. IAS/Shunts: No atrial level shunt detected by color flow Doppler.  LEFT VENTRICLE PLAX 2D LVIDd:         3.60 cm      Diastology LVIDs:         2.50 cm      LV e' medial:    6.00 cm/s LV PW:         1.00 cm      LV E/e' medial:  7.4 LV IVS:  1.10 cm      LV e' lateral:   7.62 cm/s LVOT diam:     2.20 cm      LV E/e' lateral: 5.8 LV SV:         97 LVOT Area:     3.80 cm  LV Volumes (MOD) LV vol d, MOD A2C: 98.1 ml LV vol d, MOD A4C: 108.0 ml LV vol s, MOD A2C: 39.6 ml LV vol s, MOD A4C: 40.2 ml LV SV MOD A2C:     58.5 ml LV SV MOD A4C:     108.0 ml LV SV MOD BP:      64.0 ml RIGHT VENTRICLE RV S prime:      14.80 cm/s TAPSE (M-mode): 2.3 cm LEFT ATRIUM           RIGHT ATRIUM LA diam:      3.20 cm RA Area:     14.70 cm LA Vol (A2C): 31.0 ml RA Volume:   36.60 ml LA Vol (A4C): 24.7 ml  AORTIC VALVE LVOT Vmax:   129.00 cm/s LVOT Vmean:  96.600 cm/s LVOT VTI:    0.254 m  AORTA Ao Root diam: 3.50 cm Ao Asc diam:  3.50 cm MITRAL VALVE MV Area (PHT): 2.41 cm    SHUNTS MV Decel Time: 315 msec    Systemic VTI:  0.25 m MV E velocity: 44.50 cm/s  Systemic Diam: 2.20 cm MV A velocity: 65.60 cm/s MV E/A ratio:  0.68 Loralie Champagne MD Electronically signed by Loralie Champagne MD Signature Date/Time: 10/07/2020/5:59:38 PM    Final     TTE  IMPRESSIONS:  1. Left ventricular ejection fraction, by estimation, is 65 to 70%. The  left ventricle has hyperdynamic function. The left ventricle has no  regional wall motion abnormalities. Left ventricular diastolic parameters  are consistent with Grade I diastolic  dysfunction (impaired relaxation).   2. Right ventricular systolic function is normal. The right ventricular  size is normal. Tricuspid regurgitation signal is inadequate for assessing  PA pressure.   3. The mitral valve is normal in structure. No evidence of mitral valve  regurgitation. No evidence of mitral stenosis.   4. The aortic valve is tricuspid. Aortic valve regurgitation is not  visualized. No aortic stenosis is present.   5. The inferior vena cava is normal in size with greater than 50%  respiratory variability, suggesting right atrial pressure of 3 mmHg.   EEG: IMPRESSION: This study is suggestive of cortical dysfunction in right hemisphere, likely secondary to underlying stroke.  No seizures or epileptiform discharges were seen throughout the recording.    PHYSICAL EXAM  Temp:  [98.5 F (36.9 C)-99 F (37.2 C)] 99 F (37.2 C) (08/25 1110) Pulse Rate:  [56-78] 56 (08/25 1110) Resp:  [18-20] 18 (08/25 1110) BP: (124-160)/(53-100) 124/70 (08/25 1110) SpO2:  [97 %-100 %] 98 % (08/25  1110) Weight:  [87.4 kg] 87.4 kg (08/25 0454)  General -obese elderly African-American male in no apparent distress.  Ophthalmologic - fundi not visualized due to noncooperation.  Cardiovascular - Regular rhythm and rate.  Neurological Exam-  Patient is stuporous and can be aroused with some difficulty.  His speech is mostly incomprehensible.  He follows simple commands moves right side purposefully against gravity but remains plegic on the left.  Withdraws briskly to painful stimulus on the right and minimal in the left leg but not in the left arm.  Gait and Station - deferred.   ASSESSMENT/PLAN Mr. AADARSH WALLY is  a 70 y.o. 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 no tPA given due to risk of bleeding  Increased petechial hemorrhage/evolution of previous right MCA/PCA territory infarct with mild edema with now increasing midline shift on repeat MRI 10/28/2020 MRI mild enlargement with cytotoxic edema and petechial hemorrhage without midline shift compared to 8/4 2D Echo grade 1 diastolic dysfunction, EF 65 to 70% LDL 75 HgbA1c 5.9% SCDs for VTE prophylaxis Pradaxa (dabigatran) twice a day prior to admission, now on full dose Lovenox  therapy recommendations:  pending Disposition:  pending  LUE non suppressible twitching/weakness, concern for focal seizure activity LTM right hemispheric slowing but no definite epileptiform activity  continue keppra. Since he is stable, no need for additional AED. Continue to monitor.  Hypertension Stable SBP goal 130-150 Long term BP goal normotensive  Hyperlipidemia Home meds:   lipitor 80 mg  LDL 75, goal < 70 on lipitor 40 mg Continue statin at discharge  Other Stroke Risk Factors Advanced age Cigarette smoker, advised to stop smoking Obesity, Body mass index is 32.06 kg/m.  Hx stroke/TIA  Hospital day # 14 Patient neurological exam at the moment risk complicated by effect of Ativan that he received this morning but has purposeful right-sided movements can be aroused with some difficulty.  Recommend discontinuing schooldays Lovenox and switch to IV heparin stroke protocol and discontinue free water may be contributing to his persistent and possibly decreasing edema.  This patient neurological exam does not improve or he develops further will transfer him to the neurological Care unit and start hypertonic saline.  I had a long discussion at the bedside with the patient's son regarding his overall prognosis and clearly stated patient is unable to recover to the point that he can live by himself and be independent and will require 24-hour care.  Son wants him to be given a chance for this.  Recommend patient's condition stabilized over the next few days consider elective PEG tube placement next week prior to acute transfer for rehabilitation to skilled nursing facility.  Long discussion with patient's son and daughter plan for and answered questions.    Greater than 50% time during this 35-minute visit was spent on counseling and coordination of care and discussion with care team.     Antony Contras MD   To contact Stroke Continuity provider, please refer to [email or web address] After hours, contact General Neurology

## 2020-10-29 DIAGNOSIS — Z515 Encounter for palliative care: Secondary | ICD-10-CM

## 2020-10-29 DIAGNOSIS — I639 Cerebral infarction, unspecified: Secondary | ICD-10-CM | POA: Diagnosis not present

## 2020-10-29 DIAGNOSIS — R531 Weakness: Secondary | ICD-10-CM | POA: Diagnosis not present

## 2020-10-29 DIAGNOSIS — Z7189 Other specified counseling: Secondary | ICD-10-CM | POA: Diagnosis not present

## 2020-10-29 DIAGNOSIS — R233 Spontaneous ecchymoses: Secondary | ICD-10-CM | POA: Diagnosis not present

## 2020-10-29 LAB — BASIC METABOLIC PANEL
Anion gap: 9 (ref 5–15)
BUN: 41 mg/dL — ABNORMAL HIGH (ref 8–23)
CO2: 26 mmol/L (ref 22–32)
Calcium: 9.5 mg/dL (ref 8.9–10.3)
Chloride: 106 mmol/L (ref 98–111)
Creatinine, Ser: 1.28 mg/dL — ABNORMAL HIGH (ref 0.61–1.24)
GFR, Estimated: 60 mL/min — ABNORMAL LOW (ref >60)
Glucose, Bld: 125 mg/dL — ABNORMAL HIGH (ref 70–99)
Potassium: 4.7 mmol/L (ref 3.5–5.1)
Sodium: 141 mmol/L (ref 135–145)

## 2020-10-29 LAB — GLUCOSE, CAPILLARY
Glucose-Capillary: 120 mg/dL — ABNORMAL HIGH (ref 70–99)
Glucose-Capillary: 123 mg/dL — ABNORMAL HIGH (ref 70–99)
Glucose-Capillary: 127 mg/dL — ABNORMAL HIGH (ref 70–99)
Glucose-Capillary: 135 mg/dL — ABNORMAL HIGH (ref 70–99)
Glucose-Capillary: 141 mg/dL — ABNORMAL HIGH (ref 70–99)
Glucose-Capillary: 143 mg/dL — ABNORMAL HIGH (ref 70–99)

## 2020-10-29 LAB — CBC
HCT: 40.2 % (ref 39.0–52.0)
Hemoglobin: 12.4 g/dL — ABNORMAL LOW (ref 13.0–17.0)
MCH: 25.6 pg — ABNORMAL LOW (ref 26.0–34.0)
MCHC: 30.8 g/dL (ref 30.0–36.0)
MCV: 82.9 fL (ref 80.0–100.0)
Platelets: 151 10*3/uL (ref 150–400)
RBC: 4.85 MIL/uL (ref 4.22–5.81)
RDW: 14.8 % (ref 11.5–15.5)
WBC: 6.8 10*3/uL (ref 4.0–10.5)
nRBC: 0 % (ref 0.0–0.2)

## 2020-10-29 LAB — HEPARIN LEVEL (UNFRACTIONATED)
Heparin Unfractionated: 0.63 IU/mL (ref 0.30–0.70)
Heparin Unfractionated: 0.65 IU/mL (ref 0.30–0.70)
Heparin Unfractionated: 0.66 IU/mL (ref 0.30–0.70)

## 2020-10-29 NOTE — Plan of Care (Signed)
  Problem: Clinical Measurements: Goal: Ability to maintain clinical measurements within normal limits will improve Outcome: Progressing Goal: Will remain free from infection Outcome: Progressing Goal: Diagnostic test results will improve Outcome: Progressing Goal: Cardiovascular complication will be avoided Outcome: Progressing   Problem: Nutrition: Goal: Adequate nutrition will be maintained Outcome: Progressing   Problem: Coping: Goal: Level of anxiety will decrease Outcome: Progressing   Problem: Elimination: Goal: Will not experience complications related to bowel motility Outcome: Progressing Goal: Will not experience complications related to urinary retention Outcome: Progressing   Problem: Pain Managment: Goal: General experience of comfort will improve Outcome: Progressing   Problem: Safety: Goal: Ability to remain free from injury will improve Outcome: Progressing   Problem: Skin Integrity: Goal: Risk for impaired skin integrity will decrease Outcome: Progressing   Problem: Nutrition: Goal: Dietary intake will improve Outcome: Progressing   Problem: Intracerebral Hemorrhage Tissue Perfusion: Goal: Complications of Intracerebral Hemorrhage will be minimized Outcome: Progressing   Problem: Spontaneous Subarachnoid Hemorrhage Tissue Perfusion: Goal: Complications of Spontaneous Subarachnoid Hemorrhage will be minimized Outcome: Progressing

## 2020-10-29 NOTE — Progress Notes (Signed)
[place] for heparin  Indication: history of pulmonary embolus  Allergies  Allergen Reactions   Apixaban     Other reaction(s): Liver enzymes abnormal   Lisinopril     Other reaction(s): Cough (finding)   Simvastatin     Other reaction(s): Pain in lower limb (finding)    Patient Measurements: Height: '5\' 5"'$  (165.1 cm) Weight: 87.4 kg (192 lb 10.9 oz) IBW/kg (Calculated) : 61.5 Heparin Dosing Weight: 82.5 kg  Vital Signs: Temp: 98.3 F (36.8 C) (08/25 2341) Temp Source: Oral (08/25 2341) BP: 137/68 (08/25 2341) Pulse Rate: 58 (08/25 2341)  Labs: Recent Labs    10/26/20 0251 10/28/20 1121 10/29/20 0104  HGB 12.1* 11.5* 12.4*  HCT 38.6* 36.9* 40.2  PLT 145* 130* 151  HEPARINUNFRC  --   --  0.63  CREATININE 1.24 1.31* 1.28*     Estimated Creatinine Clearance: 53.8 mL/min (A) (by C-G formula based on SCr of 1.28 mg/dL (H)).  Assessment: 70 y.o. male with h/o PE, Pradaxa on hold, for heparin.  On Lovenox 1 mg/kg SQ q12h from 8/19-8/25, last dose 8/25 at 0500.  Heparin level above goal range.  Anticipate heparin level will continue to decrease as enoxaparin is completely eliminated.    Goal of Therapy:  Heparin level 0.3-0.5 units/ml Monitor platelets by anticoagulation protocol: Yes   Plan:  No change to heparin for now Recheck level in 6 hours, adjust dose if needed at that time.  Phillis Knack, PharmD, BCPS
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
## 2020-10-29 NOTE — Progress Notes (Signed)
[place] [facility]   INTERIM SUMMARY      Clifford [name]  [id number] DOB: 1950/05/07 DOA: 10/14/2020 PCP: Pcp, No         Brief Narrative:  Clifford [name] is a 70 y.o. 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    Had an initial stroke 8/3, was admitted 2 days, then discharged with [facility].  After about 4 days at home, he developed worsening of his stroke symptoms, returned to the hospital.  In the ER, found to have enlargement of his previous stroke with micro-hemorrhage.  Initially had relatively mild deficits still, NIHSS 4, so his Pradaxa was held and he was admitted for SNF placement for short term rehab.    Here, however, he developed progressively worsening left hemiparesis, then somnolence (see NIHSS chart in progress note 8/24).  Neurology were reconsulted.  Serial head CTs on 8/16 showed expected evolution/progression of his stroke, further edema, but no herniation or substantial midline shift.  Neurology followed the patient and recommended further supportive care and SNF placement.  An NG tube was placed as the patient lost the ability to swallow.  Palliative Care were consulted and in detailed family discussions, with prognosis provided by Neurology (more likely able to regain ability to talk and participate in self cares, unlikely to regain ability to walk, uncertain if able to regain ability to swallow, very unlikely to improve to full independence), it was decided to place PEG and pursue SNF rehab.  The patient may need to improve somewhat from his current state in order to be able to engage with therapy prior to discharge but we believe he will and then can qualify for STR with Palliative following.         Assessment & Plan:  Progression of right parietal occipital watershed stroke complicated by  petechial hemorrhage Neurology suspect his current poor functional status is mostly due to swelling, with a component of this from free water and a component of this from bleeding.  They feel this is within the range of expected, and that it is likely to improve, with prognosis outlined above.      In light of this potential for recovery, Neurology recommend: - Continue atorvastatin - Hold free water to minimize cerebral edema, if Na or Cr rise, would use saline   - Continue supportive care and serial neurological exams - Repeat CT head on Monday - Anticipate PEG placement next week  - PT following       Hypertension BP mostly controlled - Continue amlodipine  Suspected focal seizures Presented with left hand twitching which was nonsuppressible and clinically suspicious for stroke, and was started on Keppra.    Keppra reduced 8/24 to eliminate sedating side effects from [place] from limiting his rehab - Continue Keppra, low treshold to stop as LTM showed no seizures and he has had no seizure like activity recently   CKD IIIa Baseline Cr 1.2-1.4 - BMP daily for now  Dysphagia - Continue tube feeds - SLP therapy - Will plan for PEG next week, IR were consulted and believe he may have a window for IR PEG placement (will need repeat order for "IR Eval and Mgmt" to re-engage them on Monday or Tuesday if needed) - If IR PEG placement turns out not to be possible, will need surgical placement     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  Last was >6 months ago, family uncertain when.  Had been on long term warfarin prior to Aug 3 stroke, discharged from that initial hospitalization on Pradaxa, held at admission.  Started on HD4, then transitioned to Lovenox when PEG placed.   - Continue heparin gtt - Plan for Eliquis after PEG placement   Lung nodule Incidental finding of 73m LUL nodule.  Has not been discussed with family. Patient former smoker. -  Recommend follow up CT in 6-12 months depending on neurological progression      Possible sclerotic vertebra on imaging Incidental finding.  PSA 5, unlikely this sclerosis is from metastatic prostate cancer with that level.  Chronic diastolic CHF  Appears euvolemic.  Not on diuretic at baseline  Hyperkalemia Resolved with Lokelma           Disposition: Status is: Inpatient  Remains inpatient appropriate because:IV treatments appropriate due to intensity of illness or inability to take PO  Dispo: The patient is from: Home              Anticipated d/c is to:  TBD              Patient currently is not medically stable to d/c.   Difficult to place patient No   Patient admitted with worsening stroke symptoms.  Has had progressively worsening mentation and symptoms since hospital day 3, likely due to cerebral edema, due to evolution and microhemorrhage of his initial infarct.    Will continue supportive cares, with expectation that his swelling will subside int he coming week and he will be able to engage in therapy, have PEG placed, and transition to short term rehab to regain the ability to speak and swallow.           Level of care: Telemetry Medical       MDM: The below labs and imaging reports were reviewed and summarized above.  Medication management as above.    DVT prophylaxis: Heparin  Code Status: FULL Family Communication: Brother by phone, sister at the bedside, attempted to calll daughter by phone, no answer            Subjective: No new fever, respiratory distress, agitation.  No seizures.  Makes some purposeful verbalizations, and weak purposeful movements with his right side.        Objective: Vitals:   10/29/20 0351 10/29/20 0403 10/29/20 0925 10/29/20 1223  BP: 128/64  135/72 (!) 161/78  Pulse: (!) 58  (!) 59   Resp: '18  17 16  '$ Temp: 98 F (36.7 C)  99.1 F (37.3 C) 98.1 F (36.7 C)  TempSrc:   Axillary Axillary   SpO2: 100%  99% 100%  Weight:  87.5 kg    Height:        Intake/Output Summary (Last 24 hours) at 10/29/2020 1335 Last data filed at 10/29/2020 1200 Gross per 24 hour  Intake 1413.95 ml  Output --  Net 1413.95 ml   Filed Weights   10/28/20 0003 10/28/20 0454 10/29/20 0403  Weight: 87.4 kg 87.4 kg 87.5 kg    Examination: General appearance: Adult male, lying in bed, rouses to touch, sluggish HEENT: Anicteric, conjunctive are normal, pupils equal and reactive, lips normal, oropharynx moist Skin: Warm and dry, no suspicious rashes or lesions. Cardiac: Regular rate and rhythm, no murmurs, no lower extremity edema    Respiratory: Respirations even, no rales or wheezing appreciated.    Abdomen: Abdomen soft,  no tenderness palpation, no ascites or distention MSK:   Neuro: Follows commands weakly, left hemiparesis, left facial droop, left sided neglect.  Psych: Unable to assess    Data Reviewed: I have personally reviewed following labs and imaging studies:  CBC: Recent Labs  Lab 10/25/20 0030 10/26/20 0251 10/28/20 1121 10/29/20 0104  WBC 5.7 5.8 4.9 6.8  HGB 13.1 12.1* 11.5* 12.4*  HCT 41.1 38.6* 36.9* 40.2  MCV 81.2 82.0 82.2 82.9  PLT 178 145* 130* [id number]   Basic Metabolic Panel: Recent Labs  Lab 10/22/20 1719 10/23/20 0437 10/23/20 1645 10/25/20 0030 10/26/20 0251 10/28/20 1121 10/29/20 0104  NA  --   --   --  135 136 141 141  K  --   --   --  5.7* 4.3 4.3 4.7  CL  --   --   --  105 105 107 106  CO2  --   --   --  20* '23 26 26  '$ GLUCOSE  --   --   --  122* 112* 114* 125*  BUN  --   --   --  30* 33* 41* 41*  CREATININE  --   --   --  1.16 1.24 1.31* 1.28*  CALCIUM  --   --   --  8.8* 9.0 9.1 9.5  MG 2.2 2.3 2.2  --   --   --   --   PHOS 3.0 3.0 2.7  --   --   --   --    GFR: Estimated Creatinine Clearance: 53.8 mL/min (A) (by C-G formula based on SCr of 1.28 mg/dL (H)). Liver Function Tests: No results for input(s): AST, ALT, ALKPHOS, BILITOT, PROT,  ALBUMIN in the last 168 hours. No results for input(s): LIPASE, AMYLASE in the last 168 hours. No results for input(s): AMMONIA in the last 168 hours. Coagulation Profile: No results for input(s): INR, PROTIME in the last 168 hours. Cardiac Enzymes: No results for input(s): CKTOTAL, CKMB, CKMBINDEX, TROPONINI in the last 168 hours. BNP (last 3 results) No results for input(s): PROBNP in the last 8760 hours. HbA1C: No results for input(s): HGBA1C in the last 72 hours. CBG: Recent Labs  Lab 10/28/20 2015 10/28/20 2340 10/29/20 0350 10/29/20 0757 10/29/20 1228  GLUCAP 132* 121* 141* 120* 143*   Lipid Profile: No results for input(s): CHOL, HDL, LDLCALC, TRIG, CHOLHDL, LDLDIRECT in the last 72 hours. Thyroid Function Tests: No results for input(s): TSH, T4TOTAL, FREET4, T3FREE, THYROIDAB in the last 72 hours. Anemia Panel: No results for input(s): VITAMINB12, FOLATE, FERRITIN, TIBC, IRON, RETICCTPCT in the last 72 hours. Urine analysis:    Component Value Date/Time   COLORURINE YELLOW 10/06/2020 [place] 10/06/2020 1353   LABSPEC 1.020 10/06/2020 1353   PHURINE 6.0 10/06/2020 1353   GLUCOSEU NEGATIVE 10/06/2020 1353   HGBUR TRACE (A) 10/06/2020 1353   BILIRUBINUR NEGATIVE 10/06/2020 1353   KETONESUR NEGATIVE 10/06/2020 1353   PROTEINUR NEGATIVE 10/06/2020 1353   NITRITE NEGATIVE 10/06/2020 1353   LEUKOCYTESUR SMALL (A) 10/06/2020 1353   Sepsis Labs: '@LABRCNTIP'$ (procalcitonin:4,lacticacidven:4)  )No results found for this or any previous visit (from the past 240 hour(s)).       Radiology Studies: CT ABDOMEN WO CONTRAST  Result Date: 10/28/2020 CLINICAL DATA:  History of stroke. Evaluate anatomy for gastrostomy tube placement. EXAM: CT ABDOMEN WITHOUT CONTRAST TECHNIQUE: Multidetector CT imaging of the abdomen was performed following the standard protocol without IV contrast. COMPARISON:  None. FINDINGS: Lower chest:  8 mm round nodule along the  periphery of the left upper lobe on sequence 5 image 4. Pleural-based densities in the left lower lobe could represent atelectasis or scarring. No large pleural effusions. Hepatobiliary: 1.2 cm low-density structure along the hepatic dome is likely an incidental finding such as a cyst. Normal appearance of the gallbladder. Pancreas: Unremarkable. No pancreatic ductal dilatation or surrounding inflammatory changes. Spleen: Normal in size without focal abnormality. Adrenals/Urinary Tract: Normal adrenal glands. Exophytic low-density structure in the anterior right kidney probably represents a cyst. Significant motion artifact in the kidneys. There is an additional cyst in the right kidney lower pole measuring up to 3.7 cm. Peripheral calcifications along the lateral aspect of the left kidney that are poorly characterized due to motion artifact. Negative for hydronephrosis. Stomach/Bowel: Currently, the transverse colon is anterior to the distal stomach body. There is a feeding tube that extends into the stomach and proximal duodenum. There is a very small percutaneous window between the transverse colon and the splenic flexure for percutaneous gastrostomy tube access. No evidence for bowel inflammation or obstruction. Vascular/Lymphatic: Diffuse atherosclerotic calcifications in the aorta and iliac arteries without an aortic aneurysm. No significant lymph node enlargement in the abdomen. Prominent coronary artery calcifications in the LAD distribution. Other: Negative for ascites. Musculoskeletal: Poorly defined sclerosis in the L3 vertebral body is nonspecific. Degenerative facet disease in lower lumbar spine. Poorly defined sclerosis involving the posterior aspect of S1. IMPRESSION: 1. Small percutaneous window for gastrostomy tube placement between the transverse colon and the splenic flexure as described. Anatomy is amendable for percutaneous gastrostomy tube placement but consider visualization of the transverse  colon during gastrostomy tube placement. 2. **An incidental finding of potential clinical significance has been found. Indeterminate 8 mm nodule in the left upper lobe. No comparison imaging to evaluate for stability. Non-contrast chest CT at 6-12 months is recommended. If the nodule is stable at time of repeat CT, then future CT at 18-24 months (from today's scan) is considered optional for low-risk patients, but is recommended for high-risk patients. This recommendation follows the consensus statement: Guidelines for Management of Incidental Pulmonary Nodules Detected on CT Images: From the Fleischner Society 2017; Radiology 2017; 284:228-243.** 3. Indeterminate sclerosis involving the L3 vertebral body. Consider correlating with PSA level. 4. Indeterminate calcifications along the periphery of the left kidney. This area is very poorly characterized due to excessive motion artifact. This could be related to old hemorrhage, trauma or even previous surgery. Electronically Signed   By: Markus Daft M.D.   On: 10/28/2020 08:05   MR BRAIN WO CONTRAST  Result Date: 10/28/2020 CLINICAL DATA:  Follow-up right brain stroke. EXAM: MRI HEAD WITHOUT CONTRAST TECHNIQUE: Multiplanar, multiecho pulse sequences of the brain and surrounding structures were obtained without intravenous contrast. COMPARISON:  Head CT 10/19/2020.  MRI 10/14/2020. FINDINGS: Brain: The study suffers from considerable motion degradation. No acute or focal finding affects the brainstem or cerebellum. Acute infarction in the right parietal and parieto-occipital brain with involvement of the posterolateral temporal lobe remains evident, consistent with inferior division MCA occlusion. The region of infarction shows increased swelling since the previous examinations. Minimal petechial blood products are present. No frank hematoma. Small foci of acute infarction seen anterior to the large infarction within the right posterior frontal cortical and  subcortical brain. Small foci acute infarction of the inferior basal ganglia which are newly seen since the study of 10/14/2020. Swelling and mass effect with right-to-left shift of 14 mm. Threatening uncal herniation on the  right. No hydrocephalus. No extra-axial collection. Old white matter infarctions of the left hemisphere as seen previously. Vascular: Major vessels at the base of the brain show flow. Skull and upper cervical spine: Negative Sinuses/Orbits: Clear/normal Other: None IMPRESSION: Progressive swelling in the right hemisphere infarction affecting the posterolateral temporal lobe, parieto-occipital junction and parietal region. Minimal petechial blood products, not increased since the previous study. No frank hematoma. Small acute infarctions anterior to that within the posterior frontal cortical and subcortical brain. Small acute infarctions at the inferior basal ganglia on the right, newly seen since the study of 10/14/2020. Increased swelling and mass effect with right-to-left shift of 14 mm and threatening uncal herniation. Electronically Signed   By: Nelson Chimes M.D.   On: 10/28/2020 12:41        Scheduled Meds:  amLODipine  10 mg Per Tube Daily   atorvastatin  40 mg Per Tube Daily   feeding supplement (PROSource TF)  45 mL Per Tube BID   fiber  1 packet Per Tube Daily   insulin aspart  0-9 Units Subcutaneous Q4H   levETIRAcetam  500 mg Per Tube BID   mouth rinse  15 mL Mouth Rinse BID   Continuous Infusions:  feeding supplement (OSMOLITE 1.5 CAL) 55 mL/hr at 10/29/20 1200   heparin 1,050 Units/hr (10/29/20 1200)     LOS: 15 days    Time spent: 25  minutes    Edwin Dada, MD [facility] 10/29/2020, 1:35 PM     Please page though [place] or Epic secure chat:  For [employer], Adult nurse

## 2020-10-29 NOTE — Plan of Care (Signed)
  Problem: Activity: Goal: Risk for activity intolerance will decrease Outcome: Progressing   Problem: Nutrition: Goal: Adequate nutrition will be maintained Outcome: Progressing   Problem: Pain Managment: Goal: General experience of comfort will improve Outcome: Progressing   

## 2020-10-29 NOTE — Progress Notes (Signed)
[place] for IV Heparin  Indication: history of pulmonary embolus  Allergies  Allergen Reactions   Apixaban     Other reaction(s): Liver enzymes abnormal   Lisinopril     Other reaction(s): Cough (finding)   Simvastatin     Other reaction(s): Pain in lower limb (finding)    Patient Measurements: Height: '5\' 5"'$  (165.1 cm) Weight: 87.5 kg (192 lb 14.4 oz) IBW/kg (Calculated) : 61.5 Heparin Dosing Weight: 82.5 kg  Vital Signs: Temp: 98.4 F (36.9 C) (08/26 1625) Temp Source: Axillary (08/26 1625) BP: 138/75 (08/26 1625) Pulse Rate: 62 (08/26 1625)  Labs: Recent Labs    10/28/20 1121 10/29/20 0104 10/29/20 0958 10/29/20 1811  HGB 11.5* 12.4*  --   --   HCT 36.9* 40.2  --   --   PLT 130* 151  --   --   HEPARINUNFRC  --  0.63 0.65 0.66  CREATININE 1.31* 1.28*  --   --     Estimated Creatinine Clearance: 53.8 mL/min (A) (by C-G formula based on SCr of 1.28 mg/dL (H)).  Assessment: 70 y.o. male with stroke complicated by petechial hemorrhage with hx PE, Pradaxa on hold, now on IV heparin.  Pt was on Lovenox 1 mg/kg SQ q12h from 8/19-8/25, last dose 8/25 at 0500.    Heparin level ~7 hrs after heparin infusion was decreased to 1050 units/hr was 0.66 units/ml, which remains above the goal range for this pt. H/H 12.4/40.2, plt 151. Per RN, no issues with IV or bleeding observed.  Goal of Therapy:  Heparin level 0.3-0.5 units/ml Monitor platelets by anticoagulation protocol: Yes   Plan:  Reduce heparin infusion to 900 units/hr Check heparin level in ~7 hrs Monitor daily heparin level, CBC Monitor for bleeding F/U plan for oral anticoagulant after PEG placement next week  Gillermina Hu, PharmD, BCPS, [facility] Clinical Pharmacist  10/29/2020 7:23 PM

## 2020-10-29 NOTE — Progress Notes (Signed)
ANTICOAGULATION CONSULT NOTE  Pharmacy Consult for heparin  Indication: history of pulmonary embolus  Allergies  Allergen Reactions   Apixaban     Other reaction(s): Liver enzymes abnormal   Lisinopril     Other reaction(s): Cough (finding)   Simvastatin     Other reaction(s): Pain in lower limb (finding)    Patient Measurements: Height: '5\' 5"'$  (165.1 cm) Weight: 87.5 kg (192 lb 14.4 oz) IBW/kg (Calculated) : 61.5 Heparin Dosing Weight: 82.5 kg  Vital Signs: Temp: 99.1 F (37.3 C) (08/26 0925) Temp Source: Axillary (08/26 0925) BP: 135/72 (08/26 0925) Pulse Rate: 59 (08/26 0925)  Labs: Recent Labs    10/28/20 1121 10/29/20 0104 10/29/20 0958  HGB 11.5* 12.4*  --   HCT 36.9* 40.2  --   PLT 130* 151  --   HEPARINUNFRC  --  0.63 0.65  CREATININE 1.31* 1.28*  --      Estimated Creatinine Clearance: 53.8 mL/min (A) (by C-G formula based on SCr of 1.28 mg/dL (H)).  Assessment: 70 y.o. male with h/o PE, Pradaxa on hold, for heparin.  On Lovenox 1 mg/kg SQ q12h from 8/19-8/25, last dose 8/25 at 0500.    Heparin level remains higher than goal range at 0.65, on 1200 units/hr. Hgb 12.4, plt 151. No s/sx of bleeding or infusion issues.   Goal of Therapy:  Heparin level 0.3-0.5 units/ml Monitor platelets by anticoagulation protocol: Yes   Plan:  Reduce heparin infusion to 1050 units/hr Order heparin level in 6 hours Monitor CBC, HL, and for s/sx of bleeding   Antonietta Jewel, PharmD, [place] [facility]  Phone: [phone] 10/29/2020 11:06 AM  Please check AMION for all [place] phone numbers After 10:00 PM, call [place] [phone]

## 2020-10-29 NOTE — Progress Notes (Signed)
Patient ID: FOSTER LORD, male   DOB: [birth year], 70 y.o.   MRN: [id number] STROKE TEAM PROGRESS NOTE     SUBJECTIVE (INTERVAL HISTORY) Patient is more awake today.  He is easily arousable and opens eyes partially.  Continues to have right gaze preference.  He is able to move right side purposefully and follows commands well.  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  Unable to speak and unable to understand them with some difficulty.  Vital signs are stable.  His sister and brother-in-law are here visiting from [place]:  [98 F (36.7 C)-99.1 F (37.3 C)] 98.1 F (36.7 C) (08/26 1223) Pulse Rate:  [58-84] 59 (08/26 0925) Cardiac Rhythm: Normal sinus rhythm (08/25 1900) Resp:  [16-18] 16 (08/26 1223) BP: (128-161)/(64-88) 161/78 (08/26 1223) SpO2:  [99 %-100 %] 100 % (08/26 1223) Weight:  [87.5 kg] 87.5 kg (08/26 0403)  Recent Labs  Lab 10/28/20 2015 10/28/20 2340 10/29/20 0350 10/29/20 0757 10/29/20 1228  GLUCAP 132* 121* 141* 120* 143*   Recent Labs  Lab 10/22/20 1719 10/23/20 0437 10/23/20 1645 10/25/20 0030 10/25/20 0030 10/26/20 0251 10/28/20 1121 10/29/20 0104  NA  --   --   --  135  --  136 141 141  K  --   --   --  5.7*  --  4.3 4.3 4.7  CL  --   --   --  105  --  105 107 106  CO2  --   --   --  20*  --  '23 26 26  '$ GLUCOSE  --   --   --  122*  --  112* 114* 125*  BUN  --   --   --  30*  --  33* 41* 41*  CREATININE  --   --   --  1.16  --  1.24 1.31* 1.28*  CALCIUM  --   --   --  8.8*   < > 9.0 9.1 9.5  MG 2.2 2.3 2.2  --   --   --   --   --   PHOS 3.0 3.0 2.7  --   --   --   --   --    < > = values in this interval not displayed.   No results for input(s): AST, ALT, ALKPHOS, BILITOT, PROT, ALBUMIN in the last 168 hours.  Recent Labs  Lab 10/25/20 0030 10/26/20 0251 10/28/20 1121 10/29/20 0104  WBC 5.7 5.8 4.9 6.8  HGB 13.1 12.1* 11.5* 12.4*  HCT 41.1 38.6* 36.9* 40.2  MCV 81.2 82.0 82.2 82.9  PLT 178 145* 130* 151   No results for  input(s): CKTOTAL, CKMB, CKMBINDEX, TROPONINI in the last 168 hours. No results for input(s): LABPROT, INR in the last 72 hours.  No results for input(s): COLORURINE, LABSPEC, [place], GLUCOSEU, HGBUR, BILIRUBINUR, KETONESUR, PROTEINUR, UROBILINOGEN, NITRITE, LEUKOCYTESUR in the last 72 hours.  Invalid input(s): APPERANCEUR     Component Value Date/Time   CHOL 128 10/07/2020 0450   TRIG 63 10/07/2020 0450   HDL 40 (L) 10/07/2020 0450   CHOLHDL 3.2 10/07/2020 0450   VLDL 13 10/07/2020 0450   LDLCALC 75 10/07/2020 0450   Lab Results  Component Value Date   HGBA1C 5.9 (H) 10/07/2020   No results found for: LABOPIA, COCAINSCRNUR, LABBENZ, AMPHETMU, THCU, LABBARB  No results for input(s): ETH in the last 168 hours.  I have personally reviewed the radiological images below and agree with the radiology  interpretations.  CT ABDOMEN WO CONTRAST  Result Date: 10/28/2020 CLINICAL DATA:  History of stroke. Evaluate anatomy for gastrostomy tube placement. EXAM: CT ABDOMEN WITHOUT CONTRAST TECHNIQUE: Multidetector CT imaging of the abdomen was performed following the standard protocol without IV contrast. COMPARISON:  None. FINDINGS: Lower chest: 8 mm round nodule along the periphery of the left upper lobe on sequence 5 image 4. Pleural-based densities in the left lower lobe could represent atelectasis or scarring. No large pleural effusions. Hepatobiliary: 1.2 cm low-density structure along the hepatic dome is likely an incidental finding such as a cyst. Normal appearance of the gallbladder. Pancreas: Unremarkable. No pancreatic ductal dilatation or surrounding inflammatory changes. Spleen: Normal in size without focal abnormality. Adrenals/Urinary Tract: Normal adrenal glands. Exophytic low-density structure in the anterior right kidney probably represents a cyst. Significant motion artifact in the kidneys. There is an additional cyst in the right kidney lower pole measuring up to 3.7 cm. Peripheral  calcifications along the lateral aspect of the left kidney that are poorly characterized due to motion artifact. Negative for hydronephrosis. Stomach/Bowel: Currently, the transverse colon is anterior to the distal stomach body. There is a feeding tube that extends into the stomach and proximal duodenum. There is a very small percutaneous window between the transverse colon and the splenic flexure for percutaneous gastrostomy tube access. No evidence for bowel inflammation or obstruction. Vascular/Lymphatic: Diffuse atherosclerotic calcifications in the aorta and iliac arteries without an aortic aneurysm. No significant lymph node enlargement in the abdomen. Prominent coronary artery calcifications in the LAD distribution. Other: Negative for ascites. Musculoskeletal: Poorly defined sclerosis in the L3 vertebral body is nonspecific. Degenerative facet disease in lower lumbar spine. Poorly defined sclerosis involving the posterior aspect of S1. IMPRESSION: 1. Small percutaneous window for gastrostomy tube placement between the transverse colon and the splenic flexure as described. Anatomy is amendable for percutaneous gastrostomy tube placement but consider visualization of the transverse colon during gastrostomy tube placement. 2. **An incidental finding of potential clinical significance has been found. Indeterminate 8 mm nodule in the left upper lobe. No comparison imaging to evaluate for stability. Non-contrast chest CT at 6-12 months is recommended. If the nodule is stable at time of repeat CT, then future CT at 18-24 months (from today's scan) is considered optional for low-risk patients, but is recommended for high-risk patients. This recommendation follows the consensus statement: Guidelines for Management of Incidental Pulmonary Nodules Detected on CT Images: From the Fleischner Society 2017; Radiology 2017; 284:228-243.** 3. Indeterminate sclerosis involving the L3 vertebral body. Consider correlating with  PSA level. 4. Indeterminate calcifications along the periphery of the left kidney. This area is very poorly characterized due to excessive motion artifact. This could be related to old hemorrhage, trauma or even previous surgery. Electronically Signed   By: Markus Daft M.D.   On: 10/28/2020 08:05   CT Angio Head W or Wo Contrast  Result Date: 10/06/2020 CLINICAL DATA:  Stroke. TIA. Assess intracranial arteries. Unsteady gait. Left-sided weakness. Left-sided visual loss. Headache. EXAM: CT ANGIOGRAPHY HEAD AND NECK TECHNIQUE: Multidetector CT imaging of the head and neck was performed using the standard protocol during bolus administration of intravenous contrast. Multiplanar CT image reconstructions and MIPs were obtained to evaluate the vascular anatomy. Carotid stenosis measurements (when applicable) are obtained utilizing NASCET criteria, using the distal internal carotid diameter as the denominator. CONTRAST:  116m OMNIPAQUE IOHEXOL 350 MG/ML SOLN COMPARISON:  Head CT earlier same day FINDINGS: CTA NECK FINDINGS Aortic arch: Aortic atherosclerosis.  Branching pattern  is normal. Right carotid system: Common carotid artery is tortuous but widely patent to the bifurcation. Carotid bifurcation is widely patent. Minimal calcified plaque at the distal ICA bulb but no stenosis. Cervical ICA widely patent. Left carotid system: Common carotid artery is tortuous but widely patent to the bifurcation. Carotid bifurcation is normal. Minimal calcified plaque at the ICA bulb but no stenosis. Cervical ICA widely patent beyond that. Vertebral arteries: Right vertebral artery origin is widely patent. Left vertebral artery origin is poorly seen because of regional venous reflux. Both vertebral arteries do appear patent through the cervical region to the foramen magnum. Skeleton: Ordinary cervical spondylosis. Other neck: No mass or lymphadenopathy. Upper chest: Upper lungs are clear. Review of the MIP images confirms the above  findings CTA HEAD FINDINGS Anterior circulation: Both internal carotid arteries are patent through the skull base and siphon regions. There is ordinary siphon atherosclerotic calcification. On the left, there is no stenosis. On the right, there is 50-70% stenosis in the siphon. Supraclinoid internal carotid arteries are widely patent. The anterior and middle cerebral vessels are patent. No large vessel occlusion, with particular attention to the right MCA. Posterior circulation: Both vertebral arteries are patent through the foramen magnum to the basilar. No basilar stenosis. Posterior circulation branch vessels are normal. Venous sinuses: Patent and normal. Anatomic variants: None significant. Review of the MIP images confirms the above findings IMPRESSION: No intracranial large vessel occlusion identified at this time, with specific attention to the right MCA branches. Minimal atherosclerotic change at both internal carotid artery bulbs. No stenosis. 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 No stenosis on the left. 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 Electronically Signed   By: Nelson Chimes M.D.   On: 10/06/2020 15:01   CT HEAD WO CONTRAST (5MM)  Result Date: 10/19/2020 CLINICAL DATA:  Follow-up stroke with altered mental status, initial encounter EXAM: CT HEAD WITHOUT CONTRAST TECHNIQUE: Contiguous axial images were obtained from the base of the skull through the vertex without intravenous contrast. COMPARISON:  CT from earlier in the same day. FINDINGS: Brain: There are again changes of edema and decreased attenuation in the distribution of the right posterior middle cerebral artery consistent with prior infarct. There is again noted some effacement of the right lateral ventricle as well as mild midline shift from right to left of approximately 4 mm. No acute hemorrhage is seen. No new infarct is noted. Vascular: No hyperdense vessel or unexpected calcification. Skull: Normal.  Negative for fracture or focal lesion. Sinuses/Orbits: No acute finding. Other: None. IMPRESSION: Evolving infarct involving the posterior aspect of the right middle cerebral artery. Persistent mass effect upon the right lateral ventricle is noted with mild midline shift. No new focal abnormality is seen. No herniation is seen. Electronically Signed   By: Inez Catalina M.D.   On: 10/19/2020 19:28   CT HEAD WO CONTRAST (5MM)  Result Date: 10/19/2020 CLINICAL DATA:  Stroke follow-up EXAM: CT HEAD WITHOUT CONTRAST TECHNIQUE: Contiguous axial images were obtained from the base of the skull through the vertex without intravenous contrast. COMPARISON:  None. FINDINGS: Brain: Evolving right posterior MCA territory infarct with increased area of cytotoxic edema now involving a greater portion of the temporal lobe. There is moderate mass effect on the right lateral ventricle. No hydrocephalus or entrapment. There is now leftward midline shift approximately 3 mm but no herniation. No acute hemorrhage. Vascular: Atherosclerotic calcification of the internal carotid arteries at the skull base. No abnormal hyperdensity of the  major intracranial arteries or dural venous sinuses. Skull: The visualized skull base, calvarium and extracranial soft tissues are normal. Sinuses/Orbits: No fluid levels or advanced mucosal thickening of the visualized paranasal sinuses. No mastoid or middle ear effusion. The orbits are normal. IMPRESSION: 1. Evolving right posterior MCA territory infarct with increased area of cytotoxic edema now involving a greater portion of the temporal lobe. 2. No acute hemorrhage. 3. Moderate mass effect on the right lateral ventricle with 3 mm leftward midline shift but no herniation. Electronically Signed   By: Ulyses Jarred M.D.   On: 10/19/2020 03:11   CT HEAD WO CONTRAST (5MM)  Result Date: 10/15/2020 CLINICAL DATA:  Neuro deficit, acute, stroke suspected. New focal defects. EXAM: CT HEAD WITHOUT CONTRAST  TECHNIQUE: Contiguous axial images were obtained from the base of the skull through the vertex without intravenous contrast. COMPARISON:  Head CT and MRI yesterday. FINDINGS: Brain: Confluent acute infarction in the right posterior temporal and temporoparietal junction regions shows discrete low-density by CT, with minimal petechial blood products but no frank hematoma. Smaller areas cortical and subcortical infarction in the right frontal and frontoparietal cortex and underlying white matter are seen by CT is subtle findings. No hemorrhage in that region. No new insult is identified. Mild swelling but no significant mass effect or any midline shift. No hydrocephalus. No extra-axial collection. Vascular: There is atherosclerotic calcification of the major vessels at the base of the brain. Skull: Negative Sinuses/Orbits: Clear/normal Other: None IMPRESSION: No apparent change since yesterday. Confluent infarction at the right posterior temporal and temporoparietal junction region with petechial blood products but no frank hematoma. Smaller more subtle infarctions of the cortical and subcortical brain in the right frontal and frontoparietal region appear unchanged. Electronically Signed   By: Nelson Chimes M.D.   On: 10/15/2020 15:16   CT HEAD WO CONTRAST (5MM)  Result Date: 10/14/2020 CLINICAL DATA:  Right-sided headache, recent stroke EXAM: CT HEAD WITHOUT CONTRAST TECHNIQUE: Contiguous axial images were obtained from the base of the skull through the vertex without intravenous contrast. COMPARISON:  10/06/2020 CT head, 10/07/2020 MRI head FINDINGS: Brain: Area of hypodensity in the right parietal and occipital lobes, which correlates with the area of infarct seen on the 10/06/2020 CT and 10/07/2020 MRI, with edema in this area. Subtle areas of hyperdensity, likely petechial hemorrhage. No evidence of hemorrhagic transformation. No new areas of infarction. No significant midline shift. No mass or extra-axial  collection. Vascular: No hyperdense vessel or unexpected calcification. Skull: Normal. Negative for fracture or focal lesion. Sinuses/Orbits: Mucosal thickening in the left frontal sinus and bilateral ethmoid air cells. The orbits are unremarkable. Other: None. IMPRESSION: Expected evolution of the previously noted right MCA/PCA territory infarct, with petechial hemorrhage and mild edema, without midline shift or hemorrhagic transformation. Electronically Signed   By: Merilyn Baba MD   On: 10/14/2020 10:02   CT HEAD WO CONTRAST (5MM)  Result Date: 10/06/2020 CLINICAL DATA:  Neuro deficit, acute, stroke suspected. Steady gait, left-sided weakness and left-sided vision loss, increased blood pressure, headache for 3 days, history of hypertension. EXAM: CT HEAD WITHOUT CONTRAST TECHNIQUE: Contiguous axial images were obtained from the base of the skull through the vertex without intravenous contrast. COMPARISON:  No pertinent prior exams available for comparison. FINDINGS: Brain: Cerebral volume is normal for age. Region of abnormal cortical/subcortical hypodensity measuring 6.3 x 3.3 x 5.8 cm within the right parietooccipital lobes compatible with acute/early subacute infarction (right PCA vascular territory and right MCA/PCA watershed territory). No significant  mass effect at this time. No evidence of hemorrhagic conversion. Chronic appearing lacunar infarct within the left lentiform nucleus. No extra-axial fluid collection. No evidence of an intracranial mass. No midline shift. Vascular: No hyperdense vessel.  Atherosclerotic calcifications. Skull: Normal. Negative for fracture or focal lesion. Sinuses/Orbits: Visualized orbits show no acute finding. Mild mucosal thickening within the frontal sinuses bilaterally. Mild-to-moderate mucosal thickening within the bilateral ethmoid air cells. These results were discussed by telephone at the time of interpretation on 10/06/2020 at 1:55 pm with provider MATTHEW TRIFAN ,  who verbally acknowledged these results. IMPRESSION: 6.3 x 3.3 x 5.8 cm acute/early subacute cortical and subcortical infarct within the right parietooccipital lobes (right PCA vascular territory and right MCA/PCA watershed territory). Consider MR or CT angiography for further evaluation. Chronic left basal ganglia lacunar infarct. Paranasal sinus disease at the imaged levels, as described. Electronically Signed   By: Kellie Simmering DO   On: 10/06/2020 14:13   CT Angio Neck W and/or Wo Contrast  Result Date: 10/06/2020 CLINICAL DATA:  Stroke. TIA. Assess intracranial arteries. Unsteady gait. Left-sided weakness. Left-sided visual loss. Headache. EXAM: CT ANGIOGRAPHY HEAD AND NECK TECHNIQUE: Multidetector CT imaging of the head and neck was performed using the standard protocol during bolus administration of intravenous contrast. Multiplanar CT image reconstructions and MIPs were obtained to evaluate the vascular anatomy. Carotid stenosis measurements (when applicable) are obtained utilizing NASCET criteria, using the distal internal carotid diameter as the denominator. CONTRAST:  174m OMNIPAQUE IOHEXOL 350 MG/ML SOLN COMPARISON:  Head CT earlier same day FINDINGS: CTA NECK FINDINGS Aortic arch: Aortic atherosclerosis.  Branching pattern is normal. Right carotid system: Common carotid artery is tortuous but widely patent to the bifurcation. Carotid bifurcation is widely patent. Minimal calcified plaque at the distal ICA bulb but no stenosis. Cervical ICA widely patent. Left carotid system: Common carotid artery is tortuous but widely patent to the bifurcation. Carotid bifurcation is normal. Minimal calcified plaque at the ICA bulb but no stenosis. Cervical ICA widely patent beyond that. Vertebral arteries: Right vertebral artery origin is widely patent. Left vertebral artery origin is poorly seen because of regional venous reflux. Both vertebral arteries do appear patent through the cervical region to the foramen  magnum. Skeleton: Ordinary cervical spondylosis. Other neck: No mass or lymphadenopathy. Upper chest: Upper lungs are clear. Review of the MIP images confirms the above findings CTA HEAD FINDINGS Anterior circulation: Both internal carotid arteries are patent through the skull base and siphon regions. There is ordinary siphon atherosclerotic calcification. On the left, there is no stenosis. On the right, there is 50-70% stenosis in the siphon. Supraclinoid internal carotid arteries are widely patent. The anterior and middle cerebral vessels are patent. No large vessel occlusion, with particular attention to the right MCA. Posterior circulation: Both vertebral arteries are patent through the foramen magnum to the basilar. No basilar stenosis. Posterior circulation branch vessels are normal. Venous sinuses: Patent and normal. Anatomic variants: None significant. Review of the MIP images confirms the above findings IMPRESSION: No intracranial large vessel occlusion identified at this time, with specific attention to the right MCA branches. Minimal atherosclerotic change at both internal carotid artery bulbs. No stenosis. 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 No stenosis on the left. 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 Electronically Signed   By: MNelson ChimesM.D.   On: 10/06/2020 15:01   MR BRAIN WO CONTRAST  Result Date: 10/28/2020 CLINICAL DATA:  Follow-up right brain stroke. EXAM: MRI HEAD WITHOUT CONTRAST  TECHNIQUE: Multiplanar, multiecho pulse sequences of the brain and surrounding structures were obtained without intravenous contrast. COMPARISON:  Head CT 10/19/2020.  MRI 10/14/2020. FINDINGS: Brain: The study suffers from considerable motion degradation. No acute or focal finding affects the brainstem or cerebellum. Acute infarction in the right parietal and parieto-occipital brain with involvement of the posterolateral temporal lobe remains evident, consistent with inferior  division MCA occlusion. The region of infarction shows increased swelling since the previous examinations. Minimal petechial blood products are present. No frank hematoma. Small foci of acute infarction seen anterior to the large infarction within the right posterior frontal cortical and subcortical brain. Small foci acute infarction of the inferior basal ganglia which are newly seen since the study of 10/14/2020. Swelling and mass effect with right-to-left shift of 14 mm. Threatening uncal herniation on the right. No hydrocephalus. No extra-axial collection. Old white matter infarctions of the left hemisphere as seen previously. Vascular: Major vessels at the base of the brain show flow. Skull and upper cervical spine: Negative Sinuses/Orbits: Clear/normal Other: None IMPRESSION: Progressive swelling in the right hemisphere infarction affecting the posterolateral temporal lobe, parieto-occipital junction and parietal region. Minimal petechial blood products, not increased since the previous study. No frank hematoma. Small acute infarctions anterior to that within the posterior frontal cortical and subcortical brain. Small acute infarctions at the inferior basal ganglia on the right, newly seen since the study of 10/14/2020. Increased swelling and mass effect with right-to-left shift of 14 mm and threatening uncal herniation. Electronically Signed   By: Nelson Chimes M.D.   On: 10/28/2020 12:41   MR BRAIN WO CONTRAST  Result Date: 10/14/2020 CLINICAL DATA:  Stroke, follow up.  Eval for progression of stroke. EXAM: MRI HEAD WITHOUT CONTRAST TECHNIQUE: Multiplanar, multiecho pulse sequences of the brain and surrounding structures were obtained without intravenous contrast. COMPARISON:  Head CT 10/14/2020 and MRI 10/07/2020 FINDINGS: The study is intermittently motion degraded including severe motion on the coronal T2 sequence. Brain: The moderately large infarct involving the posterior right MCA territory and PCA  border zone has mildly expanded compared to the prior MRI, most notable anteriorly and superiorly. Associated petechial hemorrhage is partially confluent and has increased from the prior MRI. There is progressive cytotoxic edema with regional sulcal effacement and mass effect on the right lateral ventricle. New mildly restricted diffusion in the splenium of the corpus callosum may reflect acute wallerian degeneration. Small cortical and subcortical infarcts in the right frontal lobe (border zone distribution) have also mildly increased from the prior MRI. Small T2 hyperintensities elsewhere in the cerebral white matter bilaterally are similar to the prior MRI and are nonspecific but compatible with mild chronic small vessel ischemic disease. There is no midline shift or extra-axial fluid collection. A chronic hemorrhagic infarct is again noted in the posterior aspect of the left lentiform nucleus/external capsule. Vascular: Major intracranial vascular flow voids are preserved. Skull and upper cervical spine: Unremarkable bone marrow signal para Sinuses/Orbits: Unremarkable orbits. Mild mucosal thickening in the paranasal sinuses. Clear mastoid air cells. Other: None. IMPRESSION: 1. Mild enlargement of the right MCA/border zone infarct since the 10/07/2020 MRI. Increased cytotoxic edema and petechial hemorrhage without midline shift. 2. Chronic small-vessel ischemia including an old hemorrhagic infarct in the left basal ganglia. Electronically Signed   By: Logan Bores M.D.   On: 10/14/2020 18:22   MR BRAIN WO CONTRAST  Result Date: 10/07/2020 CLINICAL DATA:  Neuro deficit, acute, stroke suspected. Additional history provided: Vision change and mild left-sided weakness which  began 2 days ago. EXAM: MRI HEAD WITHOUT CONTRAST TECHNIQUE: Multiplanar, multiecho pulse sequences of the brain and surrounding structures were obtained without intravenous contrast. COMPARISON:  Noncontrast head CT 10/06/2020. CT angiogram  head/neck 10/06/2020. FINDINGS: Brain: Cerebral volume is normal. Region of abnormal cortical/subcortical restricted diffusion within the right parietal and occipital lobes measuring 6.2 x 3.3 x 6.4 cm compatible with acute/early subacute infarction (at the junction of the right MCA and PCA vascular territories). This has not significantly changed in extent as compared to the head CT performed yesterday 10/06/2020. Mild T1 hyperintensity and SWI signal loss within the infarction territory compatible with petechial hemorrhage and possibly cortical laminar necrosis. No significant mass effect at this time. There are numerous additional small patchy cortical and subcortical acute/early subacute infarcts within the right frontal and parietal lobes (watershed distribution). Background mild multifocal T2/FLAIR hyperintensity within the cerebral white matter, nonspecific but compatible with chronic small vessel ischemic disease. Chronic lacunar infarct with associated chronic hemosiderin deposition within the left basal ganglia/posterior limb of left internal capsule. No evidence of an intracranial mass. No extra-axial fluid collection. No midline shift. Vascular: Maintained proximal arterial flow voids. Skull and upper cervical spine: No focal suspicious marrow lesion. Incompletely assessed cervical spondylosis. Sinuses/Orbits: Visualized orbits show no acute finding. 11 mm left maxillary sinus mucous retention cyst. Moderate bilateral ethmoid sinus mucosal thickening. Trace mucosal thickening within the left frontal sinus. IMPRESSION: 6.2 x 6.4 cm acute/early subacute cortical and subcortical infarction within the right parietal and occipital lobes (at the junction of the right MCA and PCA territories). The infarct has not significantly changed in extent as compared to the head CT of 10/06/2020. Mild petechial hemorrhage within the infarction territory. No significant mass effect at this time. Numerous additional small  patchy cortical and subcortical acute/early subacute infarcts within the right frontal and parietal lobes (watershed distribution). 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 Background mild chronic small vessel ischemic changes within the cerebral white matter. Paranasal sinus disease at the imaged levels, as described. Electronically Signed   By: Kellie Simmering DO   On: 10/07/2020 07:57   DG Abd Portable 1V  Result Date: 10/22/2020 CLINICAL DATA:  Encounter for NG tube EXAM: PORTABLE ABDOMEN - 1 VIEW COMPARISON:  None. FINDINGS: There is a feeding tube with tip overlying the distal stomach. There are some prominent loops of small bowel but there is no evidence of obstruction. Multilevel degenerative changes of the spine. IMPRESSION: Feeding tube tip overlies the region of the distal stomach. Electronically Signed   By: Maurine Simmering M.D.   On: 10/22/2020 13:00   DG Swallowing Func-Speech Pathology  Result Date: 10/21/2020 Table formatting from the original result was not included. Objective Swallowing Evaluation: Type of Study: MBS-Modified Barium Swallow Study  Patient Details Name: JAC BOOTON MRN: [id number] Date of Birth: 06-19-1950 Today's Date: 10/21/2020 Time: SLP Start Time (ACUTE ONLY): 0900 -SLP Stop Time (ACUTE ONLY): 0910 SLP Time Calculation (min) (ACUTE ONLY): 10 min Past Medical History: Past Medical History: Diagnosis Date  BPH (benign prostatic hyperplasia)   Chronic anticoagulation 10/14/2020  Essential hypertension 10/06/2020  History of pulmonary embolism 10/14/2020  HLD (hyperlipidemia)   HTN (hypertension)   Lung nodule   15m in 2015  [place]   Mixed hyperlipidemia 10/06/2020  Nicotine dependence, cigarettes, uncomplicated [id number] Peripheral vascular disease ([place]   Polyp of colon   Prediabetes 10/14/2020  [place]  Stroke [facility]) 10/06/2020 Past Surgical History: Past Surgical History:  Procedure Laterality Date  COLONOSCOPY  03/2017  PARTIAL NEPHRECTOMY Left 02/2018  PROSTATE BIOPSY  2015 HPI: 70yo male admitted 10/14/20 with right side headache and LUE weakness. 10/19/20 pt was noted to have increased LUE weakness. 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 CTHead = evolving RMCA infarct with cytotoxic edema, mass effect  Subjective: alert, requires cueing Assessment / Plan / Recommendation CH[facility] IP CLINICAL IMPRESSIONS 10/21/2020 Clinical Impression Pt unfortunately with very limited MBSS. Pt with eyes open during exam, responding some to simple commands but exhibited significant cognitive deficits impairing ability for swallow sequencing. Orally pt with persistent left sided oral motor deficits post CVA that contributed to left anterior spillage, oral holding of bolus, poor bolus cohesion, with minimal to no bolus manipulation. Despite multimodal cues pt was unable to propel any of tested consistencies to pharynx. Contents spilled from oral cavity and remainder was cleaned by SLP with oral care post exam. Attempted nectar thick via tsp and cup, and puree textures. Per chart review, this appears consistent with documented concern from nursing yesterday evening. Recommend NPO with consideration of short term means of alternative nutrition, continued SLP intervention with hopes of PO diet reinitiation as pt clinically tolerating. SLP notified MD and RN of recommendations and will continue to closely follow. SLP Visit Diagnosis Dysphagia, oral phase (R13.11) Attention and concentration deficit following -- Frontal lobe and executive function deficit following -- Impact on safety and function Moderate aspiration risk;Severe aspiration risk;Risk for inadequate nutrition/hydration   CH[facility] IP TREATMENT RECOMMENDATION 10/21/2020 Treatment Recommendations Therapy as outlined in treatment plan below   Prognosis 10/21/2020 Prognosis for Safe  Diet Advancement Fair Barriers to Reach Goals Cognitive deficits;Severity of deficits Barriers/Prognosis Comment -- [facility] IP DIET RECOMMENDATION 10/21/2020 SLP Diet Recommendations NPO;Alternative means - temporary Liquid Administration via -- Medication Administration Via alternative means Compensations -- Postural Changes --   CH[facility] IP OTHER RECOMMENDATIONS 10/21/2020 Recommended Consults -- Oral Care Recommendations Oral care QID Other Recommendations --   [facility] IP FOLLOW UP RECOMMENDATIONS 10/21/2020 Follow up Recommendations 24 hour supervision/assistance;Skilled Nursing facility   [facility] IP FREQUENCY AND DURATION 10/21/2020 Speech Therapy Frequency (ACUTE ONLY) min 3x week Treatment Duration 2 weeks      CHL IP ORAL PHASE 10/21/2020 Oral Phase Impaired Oral - Pudding Teaspoon -- Oral - Pudding Cup -- Oral - Honey Teaspoon -- Oral - Honey Cup -- Oral - Nectar Teaspoon Left anterior bolus loss;Weak lingual manipulation;Reduced posterior propulsion;Holding of bolus;Pocketing in anterior sulcus;Lingual/palatal residue;Delayed oral transit;Decreased bolus cohesion Oral - Nectar Cup Left anterior bolus loss;Weak lingual manipulation;Reduced posterior propulsion;Holding of bolus;Left pocketing in lateral sulci;Pocketing in anterior sulcus;Lingual/palatal residue;Delayed oral transit Oral - Nectar Straw -- Oral - Thin Teaspoon -- Oral - Thin Cup -- Oral - Thin Straw -- Oral - Puree Reduced posterior propulsion;Holding of bolus;Left pocketing in lateral sulci;Lingual/palatal residue;Delayed oral transit;Decreased bolus cohesion Oral - Mech Soft -- Oral - Regular -- Oral - Multi-Consistency -- Oral - Pill -- Oral Phase - Comment --  CHL IP PHARYNGEAL PHASE 10/21/2020 Pharyngeal Phase Impaired Pharyngeal- Pudding Teaspoon -- Pharyngeal -- Pharyngeal- Pudding Cup -- Pharyngeal -- Pharyngeal- Honey Teaspoon -- Pharyngeal -- Pharyngeal- Honey Cup -- Pharyngeal -- Pharyngeal- Nectar Teaspoon Other (Comment) Pharyngeal -- Pharyngeal- Nectar  Cup Other (Comment) Pharyngeal -- Pharyngeal- Nectar Straw -- Pharyngeal -- Pharyngeal- Thin Teaspoon -- Pharyngeal -- Pharyngeal- Thin Cup -- Pharyngeal -- Pharyngeal- Thin  Straw -- Pharyngeal -- Pharyngeal- Puree Other (Comment) Pharyngeal -- Pharyngeal- Mechanical Soft -- Pharyngeal -- Pharyngeal- Regular -- Pharyngeal -- Pharyngeal- Multi-consistency -- Pharyngeal -- Pharyngeal- Pill -- Pharyngeal -- Pharyngeal Comment --  No flowsheet data found. [place] MA, CCC-SLP 10/21/2020, 9:35 AM              EEG adult  Result Date: 10/15/2020 Lora Havens, MD     10/15/2020 10:31 AM Patient Name: LUNDEN MOBERLY MRN: [id number] Epilepsy Attending: Lora Havens Referring Physician/Provider: Dr Lesleigh Noe Date: 10/15/2020 Duration: 23.02 mins Patient history: 70 year old male with posterior right parietal watershed infarct who presented with worsening stroke symptoms as well as left thumb rhythmic and nonsuppressible shaking movements.  EEG to evaluate for seizures. Level of alertness: Awake, asleep AEDs during EEG study: LEV Technical aspects: This EEG study was done with scalp electrodes positioned according to the 10-20 International system of electrode placement. Electrical activity was acquired at a sampling rate of '500Hz'$  and reviewed with a high frequency filter of '70Hz'$  and a low frequency filter of '1Hz'$ . EEG data were recorded continuously and digitally stored. Description: The posterior dominant rhythm consists of 8-9 Hz activity of moderate voltage (25-35 uV) seen predominantly in posterior head regions, asymmetric ( R<L) and reactive to eye opening and eye closing. Sleep was characterized by vertex waves, sleep spindles (12 to 14 Hz), maximal frontocentral region.  EEG showed continuous rhythmic 3 to 5 Hz theta-delta slowing in right hemisphere. Hyperventilation and photic stimulation were not performed.   ABNORMALITY - Continuous slow, right hemisphere - Background asymmetry, right<left  IMPRESSION: This study is suggestive of cortical dysfunction in right hemisphere, likely secondary to underlying stroke.  No seizures or epileptiform discharges were seen throughout the recording. Priyanka Barbra Sarks   Overnight EEG with video  Result Date: 10/16/2020 Lora Havens, MD     10/16/2020  3:23 PM Patient Name: YOSHIKI RAICHE MRN: [id number] Epilepsy Attending: Lora Havens Referring Physician/Provider: Dr Rosalin Hawking Duration: 10/15/2020 1639 to 10/16/2020 0230  Patient history: 70 year old male with posterior right parietal watershed infarct who presented with worsening stroke symptoms as well as left thumb rhythmic and nonsuppressible shaking movements.  EEG to evaluate for seizures.  Level of alertness: Awake, asleep  AEDs during EEG study: LEV  Technical aspects: This EEG study was done with scalp electrodes positioned according to the 10-20 International system of electrode placement. Electrical activity was acquired at a sampling rate of '500Hz'$  and reviewed with a high frequency filter of '70Hz'$  and a low frequency filter of '1Hz'$ . EEG data were recorded continuously and digitally stored.  Description: The posterior dominant rhythm consists of 8-9 Hz activity of moderate voltage (25-35 uV) seen predominantly in posterior head regions, asymmetric ( R<L) and reactive to eye opening and eye closing. Sleep was characterized by vertex waves, sleep spindles (12 to 14 Hz), maximal frontocentral region.  EEG showed continuous rhythmic 3 to 5 Hz theta-delta slowing in right hemisphere. Hyperventilation and photic stimulation were not performed. EEG was not interpretable after 0230 on 10/16/2020 due to significant electrode artifact.   ABNORMALITY - Continuous slow, right hemisphere - Background asymmetry, right<left  IMPRESSION: This study is suggestive of cortical dysfunction in right hemisphere, likely secondary to underlying stroke.  No seizures or epileptiform discharges were seen throughout the recording.   Lora Havens   ECHOCARDIOGRAM COMPLETE  Result Date: 10/07/2020    ECHOCARDIOGRAM REPORT   Patient Name:   JAVONI DESANTOS  Date of Exam: 10/07/2020 Medical Rec #:  [id number]    Height:       65.0 in Accession #:    [id number]   Weight: Date of Birth:  1950/06/02     BSA: Patient Age:    51 years     BP:           115/58 mmHg Patient Gender: M            HR:           66 bpm. Exam Location:  Inpatient Procedure: 2D Echo, Cardiac Doppler and Color Doppler Indications:    Stroke I63.9  History:        Patient has no prior history of Echocardiogram examinations.                 Risk Factors:Hypertension and Dyslipidemia. PVD. PE.  Sonographer:    Vickie Epley RDCS Referring Phys: [place]  1. Left ventricular ejection fraction, by estimation, is 65 to 70%. The left ventricle has hyperdynamic function. The left ventricle has no regional wall motion abnormalities. Left ventricular diastolic parameters are consistent with Grade I diastolic dysfunction (impaired relaxation).  2. Right ventricular systolic function is normal. The right ventricular size is normal. Tricuspid regurgitation signal is inadequate for assessing PA pressure.  3. The mitral valve is normal in structure. No evidence of mitral valve regurgitation. No evidence of mitral stenosis.  4. The aortic valve is tricuspid. Aortic valve regurgitation is not visualized. No aortic stenosis is present.  5. The inferior vena cava is normal in size with greater than 50% respiratory variability, suggesting right atrial pressure of 3 mmHg. FINDINGS  Left Ventricle: Left ventricular ejection fraction, by estimation, is 65 to 70%. The left ventricle has hyperdynamic function. The left ventricle has no regional wall motion abnormalities. The left ventricular internal cavity size was normal in size. There is no left ventricular hypertrophy. Left ventricular diastolic parameters are consistent with Grade I diastolic dysfunction (impaired relaxation).  Right Ventricle: The right ventricular size is normal. No increase in right ventricular wall thickness. Right ventricular systolic function is normal. Tricuspid regurgitation signal is inadequate for assessing PA pressure. Left Atrium: Left atrial size was normal in size. Right Atrium: Right atrial size was normal in size. Pericardium: There is no evidence of pericardial effusion. Mitral Valve: The mitral valve is normal in structure. No evidence of mitral valve regurgitation. No evidence of mitral valve stenosis. Tricuspid Valve: The tricuspid valve is normal in structure. Tricuspid valve regurgitation is not demonstrated. Aortic Valve: The aortic valve is tricuspid. Aortic valve regurgitation is not visualized. No aortic stenosis is present. Pulmonic Valve: The pulmonic valve was normal in structure. Pulmonic valve regurgitation is not visualized. Aorta: The aortic root is normal in size and structure. Venous: The inferior vena cava is normal in size with greater than 50% respiratory variability, suggesting right atrial pressure of 3 mmHg. IAS/Shunts: No atrial level shunt detected by color flow Doppler.  LEFT VENTRICLE PLAX 2D LVIDd:         3.60 cm      Diastology LVIDs:         2.50 cm      LV e' medial:    6.00 cm/s LV PW:         1.00 cm      LV E/e' medial:  7.4 LV IVS:        1.10 cm      LV e' lateral:  7.62 cm/s LVOT diam:     2.20 cm      LV E/e' lateral: 5.8 LV SV:         97 LVOT Area:     3.80 cm  LV Volumes (MOD) LV vol d, MOD A2C: 98.1 ml LV vol d, MOD A4C: 108.0 ml LV vol s, MOD A2C: 39.6 ml LV vol s, MOD A4C: 40.2 ml LV SV MOD A2C:     58.5 ml LV SV MOD A4C:     108.0 ml LV SV MOD BP:      64.0 ml RIGHT VENTRICLE RV S prime:     14.80 cm/s TAPSE (M-mode): 2.3 cm LEFT ATRIUM           RIGHT ATRIUM LA diam:      3.20 cm RA Area:     14.70 cm LA Vol (A2C): 31.0 ml RA Volume:   36.60 ml LA Vol (A4C): 24.7 ml  AORTIC VALVE LVOT Vmax:   129.00 cm/s LVOT Vmean:  96.600 cm/s LVOT VTI:    0.254 m  AORTA  Ao Root diam: 3.50 cm Ao Asc diam:  3.50 cm MITRAL VALVE MV Area (PHT): 2.41 cm    SHUNTS MV Decel Time: 315 msec    Systemic VTI:  0.25 m MV E velocity: 44.50 cm/s  Systemic Diam: 2.20 cm MV A velocity: 65.60 cm/s MV E/A ratio:  0.68 Loralie Champagne MD Electronically signed by Loralie Champagne MD Signature Date/Time: 10/07/2020/5:59:38 PM    Final     TTE  IMPRESSIONS:  1. Left ventricular ejection fraction, by estimation, is 65 to 70%. The  left ventricle has hyperdynamic function. The left ventricle has no  regional wall motion abnormalities. Left ventricular diastolic parameters  are consistent with Grade I diastolic  dysfunction (impaired relaxation).   2. Right ventricular systolic function is normal. The right ventricular  size is normal. Tricuspid regurgitation signal is inadequate for assessing  PA pressure.   3. The mitral valve is normal in structure. No evidence of mitral valve  regurgitation. No evidence of mitral stenosis.   4. The aortic valve is tricuspid. Aortic valve regurgitation is not  visualized. No aortic stenosis is present.   5. The inferior vena cava is normal in size with greater than 50%  respiratory variability, suggesting right atrial pressure of 3 mmHg.   EEG: IMPRESSION: This study is suggestive of cortical dysfunction in right hemisphere, likely secondary to underlying stroke.  No seizures or epileptiform discharges were seen throughout the recording.    PHYSICAL EXAM  Temp:  [98 F (36.7 C)-99.1 F (37.3 C)] 98.1 F (36.7 C) (08/26 1223) Pulse Rate:  [58-84] 59 (08/26 0925) Resp:  [16-18] 16 (08/26 1223) BP: (128-161)/(64-88) 161/78 (08/26 1223) SpO2:  [99 %-100 %] 100 % (08/26 1223) Weight:  [87.5 kg] 87.5 kg (08/26 0403)  General -obese elderly African-American male in no apparent distress.  Ophthalmologic - fundi not visualized due to noncooperation.  Cardiovascular - Regular rhythm and rate.  Neurological Exam-  Patient is drowsy but can be  aroused easily.Marland Kitchen  His speech is dysarthric but can be understood with some difficulty.Marland Kitchen  He follows simple commands moves right side purposefully against gravity but remains plegic on the left.  Withdraws briskly to painful stimulus on the right and minimal in the left leg but not in the left arm.  Gait and Station - deferred.   ASSESSMENT/PLAN Mr. KAIUS SALBER is a 70 y.o. male with history of diastolic  CHF (EF 65 to 70%), hypertension, hyperlipidemia, multiple pulmonary emboli on lifelong anticoagulation (on Pradaxa at home), recent discharge from the hospital (8/3) for right MCA/PCA watershed ischemic stroke, re-admitted for worsening left upper extremity weakness and concern for focal motor seizure of left upper extremity in setting of worsening petechial cerebral hemorrhage. no tPA given due to risk of bleeding  Increased petechial hemorrhage/evolution of previous right MCA/PCA territory infarct with mild edema with now increasing midline shift on repeat MRI 10/28/2020 MRI mild enlargement with cytotoxic edema and petechial hemorrhage with increased midline shift compared to 10/07/20 2D Echo grade 1 diastolic dysfunction, EF 65 to 70% LDL 75 HgbA1c 5.9% SCDs for VTE prophylaxis Pradaxa (dabigatran) twice a day prior to admission, now on full dose Lovenox  therapy recommendations:  pending Disposition:  pending  LUE non suppressible twitching/weakness, concern for focal seizure activity LTM right hemispheric slowing but no definite epileptiform activity  continue keppra. Since he is stable, no need for additional AED. Continue to monitor.  Hypertension Stable SBP goal 130-150 Long term BP goal normotensive  Hyperlipidemia Home meds:  lipitor 80 mg  LDL 75, goal < 70 on lipitor 40 mg Continue statin at discharge  Other Stroke Risk Factors Advanced age Cigarette smoker, advised to stop smoking Obesity, Body mass index is 32.1 kg/m.  Hx stroke/TIA  Hospital day # 15 Patient  neurological exam today appears improved compared to yesterday but MRI yesterday had shown increasing cytotoxic edema and midline shift.  Continue IV heparin drip per stroke protocol given his history of DVT and switch to Pradaxa after PEG tube.  Continue tube feedings through core track tube now but we will likely need PEG tube early next week..  I had a long discussion at the bedside with the patient's sister and brother-in-law regarding his overall prognosis and clearly stated patient is unable to recover to the point that he can live by himself and be independent and will require 24-hour care.  Son wants him to be given a chance for this.  Recommend patient's condition stabilized over the next few days consider elective PEG tube placement next week prior to acute transfer for rehabilitation to skilled nursing facility.  Long discussion with Dr. Loleta Books and answered questions.    Greater than 50% time during this 25-minute visit was spent on counseling and coordination of care and discussion with care team.  Stroke team will sign off.  Kindly call for questions.   Antony Contras MD   To contact Stroke Continuity provider, please refer to [email or web address] After hours, contact General Neurology

## 2020-10-29 NOTE — Plan of Care (Signed)
  Problem: Clinical Measurements: Goal: Will remain free from infection Outcome: Progressing Goal: Diagnostic test results will improve Outcome: Progressing Goal: Respiratory complications will improve Outcome: Progressing Goal: Cardiovascular complication will be avoided Outcome: Progressing   Problem: Coping: Goal: Level of anxiety will decrease Outcome: Progressing   

## 2020-10-29 NOTE — Progress Notes (Signed)
Daily Progress Note   Patient Name: Clifford [name]       Date: 10/29/2020 DOB: 03-13-1950  Age: 70 y.o. MRN#: [id number] Attending Physician: Edwin Dada, * Primary Care Physician: Pcp, No Admit Date: 10/14/2020  Reason for Consultation/Follow-up: Establishing goals of care  Patient Profile/HPI: 70 y.o. 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 Seemed to be making some progress at beginning of hospitalization followed by increased decline in status last few days more concerning for overall prognosis.   Subjective: Dr. Leonie Man and Dr. Loleta Books at bedside and had long discussion with patient's sister and brother regarding patient's current status and prognosis.  Appears his mental status has improved some since yesterday- however he continues to be significantly debilitated and likely will require long term dependent care. I followed up with patient's sister, Clifford [name]. She shared that she does not believe that if patient heard what the doctors were saying about his prognosis (long term debility, long term SNF living) that he would wish to prolong his life with any artificial means including tube feeding. Ann plans to follow up with further discussions with Clifford [name]. I also discussed with Clifford [name] what a patient comfort focused plan of care would look like if a feeding tube was not in line with his goals of care.  I also called patient's daughter Clifford [name] for  gentle followup from previous discussions.  Clifford [name] agrees that current plan is to see how patient fares over the weekend before making definitive decisions regarding long term feeding tube.  Encouraged both Clifford [name] and Clifford [name] to consider what patient's definition of "recovery" would be, what his definition of quality of life is and continue advocating for the patient within the context of those definitions.    Physical Exam Vitals and nursing note reviewed.  Pulmonary:     Effort: Pulmonary effort is normal.  Neurological:     Comments: Unable to assess orientation            Vital Signs: BP (!) 161/78 (BP Location: Right Arm)   Pulse (!) 59  Temp 98.1 F (36.7 C) (Axillary)   Resp 16   Ht '5\' 5"'$  (1.651 m)   Wt 87.5 kg   SpO2 100%   BMI 32.10 kg/m  SpO2: SpO2: 100 % O2 Device: O2 Device: Room Air O2 Flow Rate:    Intake/output summary:  Intake/Output Summary (Last 24 hours) at 10/29/2020 1358 Last data filed at 10/29/2020 1200 Gross per 24 hour  Intake 1413.95 ml  Output --  Net 1413.95 ml   LBM: Last BM Date: 10/27/20 Baseline Weight: Weight: 95.7 kg Most recent weight: Weight: 87.5 kg       Palliative Assessment/Data: PPS: 10%      Patient Active Problem List   Diagnosis Date Noted  . Left-sided weakness 10/14/2020  . Prediabetes 10/14/2020  . History of pulmonary embolism 10/14/2020  . Chronic anticoagulation 10/14/2020  . Nicotine dependence, cigarettes, uncomplicated 0000000  . Petechial hemorrhage 10/14/2020  . Acute stroke due to ischemia ([place]) 10/14/2020  . Stroke ([place]) 10/06/2020  . Essential hypertension 10/06/2020  . Mixed hyperlipidemia 10/06/2020    Palliative Care Assessment & Plan    Assessment/Recommendations/Plan  Continue current plan of care Family will continue to discuss goals of care with each other They wish to see what/if improvements patient makes over the weekend for making any final decisions PMT will followup on Monday  with patient's children   Code Status: DNR  Prognosis:  Unable to determine  Discharge Planning: To Be Determined  Care plan was discussed with patient's care team and family.  Thank you for allowing the Palliative Medicine Team to assist in the care of this patient.  Total time:  68 minutes Prolonged billing: Yes     Greater than 50%  of this time was spent counseling and coordinating care related to the above assessment and plan.  Mariana Kaufman, AGNP-C Palliative Medicine   Please contact Palliative Medicine Team phone at [phone] for questions and concerns.

## 2020-10-30 DIAGNOSIS — I10 Essential (primary) hypertension: Secondary | ICD-10-CM | POA: Diagnosis not present

## 2020-10-30 DIAGNOSIS — R531 Weakness: Secondary | ICD-10-CM | POA: Diagnosis not present

## 2020-10-30 LAB — CBC
HCT: 37.6 % — ABNORMAL LOW (ref 39.0–52.0)
Hemoglobin: 11.8 g/dL — ABNORMAL LOW (ref 13.0–17.0)
MCH: 26.2 pg (ref 26.0–34.0)
MCHC: 31.4 g/dL (ref 30.0–36.0)
MCV: 83.4 fL (ref 80.0–100.0)
Platelets: 139 10*3/uL — ABNORMAL LOW (ref 150–400)
RBC: 4.51 MIL/uL (ref 4.22–5.81)
RDW: 14.9 % (ref 11.5–15.5)
WBC: 6.4 10*3/uL (ref 4.0–10.5)
nRBC: 0 % (ref 0.0–0.2)

## 2020-10-30 LAB — GLUCOSE, CAPILLARY
Glucose-Capillary: 124 mg/dL — ABNORMAL HIGH (ref 70–99)
Glucose-Capillary: 125 mg/dL — ABNORMAL HIGH (ref 70–99)
Glucose-Capillary: 132 mg/dL — ABNORMAL HIGH (ref 70–99)
Glucose-Capillary: 135 mg/dL — ABNORMAL HIGH (ref 70–99)
Glucose-Capillary: 138 mg/dL — ABNORMAL HIGH (ref 70–99)
Glucose-Capillary: 150 mg/dL — ABNORMAL HIGH (ref 70–99)
Glucose-Capillary: 154 mg/dL — ABNORMAL HIGH (ref 70–99)

## 2020-10-30 LAB — BASIC METABOLIC PANEL
Anion gap: 6 (ref 5–15)
BUN: 46 mg/dL — ABNORMAL HIGH (ref 8–23)
CO2: 26 mmol/L (ref 22–32)
Calcium: 9.2 mg/dL (ref 8.9–10.3)
Chloride: 110 mmol/L (ref 98–111)
Creatinine, Ser: 1.26 mg/dL — ABNORMAL HIGH (ref 0.61–1.24)
GFR, Estimated: >60 mL/min (ref >60)
Glucose, Bld: 130 mg/dL — ABNORMAL HIGH (ref 70–99)
Potassium: 4.2 mmol/L (ref 3.5–5.1)
Sodium: 142 mmol/L (ref 135–145)

## 2020-10-30 LAB — HEPARIN LEVEL (UNFRACTIONATED)
Heparin Unfractionated: 0.48 IU/mL (ref 0.30–0.70)
Heparin Unfractionated: 0.34 IU/mL (ref 0.30–0.70)
Heparin Unfractionated: 0.43 IU/mL (ref 0.30–0.70)

## 2020-10-30 LAB — C-REACTIVE PROTEIN: CRP: 4.2 mg/dL — ABNORMAL HIGH (ref <1.0)

## 2020-10-30 LAB — SEDIMENTATION RATE: Sed Rate: 84 mm/hr — ABNORMAL HIGH (ref 0–16)

## 2020-10-30 MED ORDER — PANTOPRAZOLE SODIUM 40 MG PO PACK
40.0000 mg | PACK | Freq: Every day | ORAL | Status: DC
  Administered 2020-10-30 – 2020-11-18 (×19): 40 mg
  Filled 2020-10-30 (×19): qty 20

## 2020-10-30 MED ORDER — METHYLPREDNISOLONE SODIUM SUCC 125 MG IJ SOLR
80.0000 mg | Freq: Once | INTRAMUSCULAR | Status: AC
  Administered 2020-10-30: 80 mg via INTRAVENOUS
  Filled 2020-10-30: qty 2

## 2020-10-30 MED ORDER — ERYTHROMYCIN 5 MG/GM OP OINT
TOPICAL_OINTMENT | Freq: Three times a day (TID) | OPHTHALMIC | Status: DC
  Administered 2020-11-02 – 2020-11-17 (×4): 1 via OPHTHALMIC
  Filled 2020-10-30 (×2): qty 3.5

## 2020-10-30 NOTE — Progress Notes (Signed)
[place] for IV Heparin  Indication: history of pulmonary embolus  Allergies  Allergen Reactions   Apixaban     Other reaction(s): Liver enzymes abnormal   Lisinopril     Other reaction(s): Cough (finding)   Simvastatin     Other reaction(s): Pain in lower limb (finding)    Patient Measurements: Height: '5\' 5"'$  (165.1 cm) Weight: 87.5 kg (192 lb 14.4 oz) IBW/kg (Calculated) : 61.5 Heparin Dosing Weight: 82.5 kg  Vital Signs: Temp: 98.8 F (37.1 C) (08/27 1204) Temp Source: Oral (08/27 1204) BP: 147/74 (08/27 1204) Pulse Rate: 64 (08/27 1204)  Labs: Recent Labs    10/28/20 1121 10/29/20 0104 10/29/20 0958 10/29/20 1811 10/30/20 0035 10/30/20 1021  HGB 11.5* 12.4*  --   --  11.8*  --   HCT 36.9* 40.2  --   --  37.6*  --   PLT 130* 151  --   --  139*  --   HEPARINUNFRC  --  0.63   < > 0.66 0.48 0.34  CREATININE 1.31* 1.28*  --   --  1.26*  --    < > = values in this interval not displayed.    Estimated Creatinine Clearance: 54.7 mL/min (A) (by C-G formula based on SCr of 1.26 mg/dL (H)).  Assessment: 70 y.o. male with stroke complicated by petechial hemorrhage with hx PE, Pradaxa on hold, now on IV heparin.  Pt was on Lovenox 1 mg/kg SQ q12h from 8/19-8/25, last dose 8/25 at 0500.    Heparin level at 0.34 which is at the lower end of the goal range for this pt. H/H 11.8/37.6, plt 139. Per RN, no issues with IV or bleeding observed. Anticipate that this will continue to decrease and will adjust rate up slightly.   Goal of Therapy:  Heparin level 0.3-0.5 units/ml Monitor platelets by anticoagulation protocol: Yes   Plan:  - Increase heparin to 950 units/hr - Monitor daily heparin level, CBC - Monitor for bleeding - F/U plan for oral anticoagulant after PEG placement next week   Thank you for allowing pharmacy to be a part of this patient's care.  Ardyth Harps, PharmD Clinical Pharmacist

## 2020-10-30 NOTE — Progress Notes (Signed)
[place] for IV Heparin  Indication: history of pulmonary embolus  Allergies  Allergen Reactions   Apixaban     Other reaction(s): Liver enzymes abnormal   Lisinopril     Other reaction(s): Cough (finding)   Simvastatin     Other reaction(s): Pain in lower limb (finding)    Patient Measurements: Height: '5\' 5"'$  (165.1 cm) Weight: 87.5 kg (192 lb 14.4 oz) IBW/kg (Calculated) : 61.5 Heparin Dosing Weight: 82.5 kg  Vital Signs: Temp: 98.4 F (36.9 C) (08/26 2354) Temp Source: Oral (08/26 2354) BP: 149/60 (08/26 2354) Pulse Rate: 60 (08/26 2354)  Labs: Recent Labs    10/28/20 1121 10/28/20 1121 10/29/20 0104 10/29/20 0958 10/29/20 1811 10/30/20 0035  HGB 11.5*  --  12.4*  --   --  11.8*  HCT 36.9*  --  40.2  --   --  37.6*  PLT 130*  --  151  --   --  139*  HEPARINUNFRC  --    < > 0.63 0.65 0.66 0.48  CREATININE 1.31*  --  1.28*  --   --  1.26*   < > = values in this interval not displayed.    Estimated Creatinine Clearance: 54.7 mL/min (A) (by C-G formula based on SCr of 1.26 mg/dL (H)).  Assessment: 70 y.o. male with stroke complicated by petechial hemorrhage with hx PE, Pradaxa on hold, now on IV heparin.  Pt was on Lovenox 1 mg/kg SQ q12h from 8/19-8/25, last dose 8/25 at 0500.    Heparin level ~7 hrs after heparin infusion was decreased to 1050 units/hr was 0.66 units/ml, which remains above the goal range for this pt. H/H 12.4/40.2, plt 151. Per RN, no issues with IV or bleeding observed.  8/27 AM update:  Heparin level therapeutic after rate decrease  Goal of Therapy:  Heparin level 0.3-0.5 units/ml Monitor platelets by anticoagulation protocol: Yes   Plan:  Cont heparin 900 units/hr 1000 heparin level Monitor daily heparin level, CBC Monitor for bleeding F/U plan for oral anticoagulant after PEG placement next week  Narda Bonds, PharmD, [place] Pharmacist Phone: [phone]

## 2020-10-30 NOTE — Progress Notes (Signed)
  Speech Language Pathology Treatment: Dysphagia  Patient Details Name: Clifford [name] MRN: [id number] DOB: 02-Dec-1950 Today's Date: 10/30/2020 Time: [id number] SLP Time Calculation (min) (ACUTE ONLY): 20 min  Assessment / Plan / Recommendation Clinical Impression  Clifford [name] demonstrated mild improvement toward swallow goals. He was awake and drowsy during session only requiring mild stimuli intermittently. Although mild delay he transited bolus of pudding and initiated swallow response x 3. There was possible concern for airway intrusion with delayed cough after several minutes. His wakefulness continues to wax and wane which would affect intake and agree that PEG is optimal route to ensure nutrient. ST will continue treatment and return to po status and cognitive function.    HPI HPI: 70yo male admitted 10/14/20 with right side headache and LUE weakness. 10/19/20 pt was noted to have increased LUE weakness. 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 CTHead = evolving RMCA infarct with cytotoxic edema, mass effect      SLP Plan  Continue with current plan of care       Recommendations  Diet recommendations: NPO Medication Administration: Via alternative means                Oral Care Recommendations: Oral care QID Follow up Recommendations: Skilled Nursing facility SLP Visit Diagnosis: Dysphagia, unspecified (R13.10) Plan: Continue with current plan of care       GO                Houston Siren 10/30/2020, 4:05 PM  Orbie Pyo Colvin Caroli.Ed Risk analyst [phone] Office [phone]

## 2020-10-30 NOTE — Progress Notes (Signed)
Clifford [name]   INTERIM SUMMARY      Clifford [name]  [id number] DOB: [birth year] DOA: 10/14/2020 PCP: Pcp, No         Brief Narrative:   Clifford [name] is a 70 y.o. 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    Had an initial stroke 8/3, was admitted 2 days, then discharged with [facility].  After about 4 days at home, he developed worsening of his stroke symptoms, returned to the hospital.  In the ER, found to have enlargement of his previous stroke with micro-hemorrhage.  Initially had relatively mild deficits still, NIHSS 4, so his Pradaxa was held and he was admitted for SNF placement for short term rehab.    Here, however, he developed progressively worsening left hemiparesis, then somnolence (see NIHSS chart in progress note 8/24).  Neurology were reconsulted.  Serial head CTs on 8/16 showed expected evolution/progression of his stroke, further edema, but no herniation or substantial midline shift.  Neurology followed the patient and recommended further supportive care and SNF placement.  An NG tube was placed as the patient lost the ability to swallow.  Palliative Care were consulted and in detailed family discussions, with prognosis provided by Neurology (more likely able to regain ability to talk and participate in self cares, unlikely to regain ability to walk, uncertain if able to regain ability to swallow, very unlikely to improve to full independence), it was decided to place PEG and pursue SNF rehab.  The patient may need to improve somewhat from his current state in order to be able to engage with therapy prior to discharge but we believe he will and then can qualify for STR with Palliative following.         Assessment & Plan:    Increased petechial hemorrhage/evolution of previous right MCA/PCA  territory infarct with mild edema with now increasing midline shift on repeat MRI 10/28/2020 - MRI mild enlargement with cytotoxic edema and petechial hemorrhage with increased midline shift compared to 10/07/20. - Neurology suspect his current poor functional status is mostly due to swelling, with a component of this from free water and a component of this from bleeding.  They feel this is within the range of expected, and that it is likely to improve, with prognosis outlined above.   -He remains with significant dysphagia, will request PEG placement.  In light of this potential for recovery, Neurology recommend: - Continue atorvastatin - Hold free water to minimize cerebral edema, if Na or Cr rise, would use saline  - Continue supportive care and serial neurological exams - Repeat CT head on Monday    Hypertension BP mostly controlled - Continue amlodipine  Suspected focal seizures Presented with left hand twitching which was nonsuppressible and clinically suspicious for stroke, and was started on Keppra.    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  Last was >6 months ago, family uncertain when.  Had been on long term warfarin prior to Aug 3 stroke, discharged from that initial hospitalization on Pradaxa, held at admission.  Started on HD4, then transitioned to Lovenox when PEG placed.   - Continue heparin gtt - Plan for Eliquis after PEG placement   Lung  nodule Incidental finding of 11m LUL nodule.  Has not been discussed with family. Patient former smoker. - Recommend follow up CT in 6-12 months depending on neurological progressio   Possible sclerotic vertebra on imaging Incidental finding.  PSA 5, unlikely this sclerosis is from metastatic prostate cancer with that level.  Chronic diastolic CHF  Appears euvolemic.  Not on diuretic at baseline  Hyperkalemia Resolved with Lokelma  Right eye pain -With some scleral injection, discussed with ophthalmology, likely due to abrasion from stroke and I remains open, started on erythromycin ointment per ophthalmology recommendation, patient reports some improvement, to be reassessed officially by ophthalmology within 1 or 2 days.          Disposition: Status is: Inpatient  Remains inpatient appropriate because:IV treatments appropriate due to intensity of illness or inability to take PO  Dispo: The patient is from: Home              Anticipated d/c is to:  TBD              Patient currently is not medically stable to d/c.   Difficult to place patient No   Patient admitted with worsening stroke symptoms.  Has had progressively worsening mentation and symptoms since hospital day 3, likely due to cerebral edema, due to evolution and microhemorrhage of his initial infarct.    Will continue supportive cares, with expectation that his swelling will subside int he coming week and he will be able to engage in therapy, have PEG placed, and transition to short term rehab to regain the ability to speak and swallow.           Level of care: Telemetry Medical       MDM: The below labs and imaging reports were reviewed and summarized above.  Medication management as above.    DVT prophylaxis: Heparin GTT  Code Status: FULL Family Communication: D/W sister at the bedside.     Subjective:  Significant events overnight as discussed with staff, patient was able to say right  eye pain   Objective: Vitals:   10/29/20 2354 10/30/20 0343 10/30/20 0808 10/30/20 1204  BP: (!) 149/60 126/65 (!) 148/63 (!) 147/74  Pulse: 60 (!) 58 62 64  Resp: '17 17 16 18  '$ Temp: 98.4 F (36.9 C) 98 F (36.7 C) 97.9 F (36.6 C) 98.8 F (37.1 C)  TempSrc: Oral Oral Axillary Oral  SpO2: 100% 98% 100% 100%  Weight:      Height:       No intake or output data in the 24 hours ending 10/30/20 1427  Filed Weights   10/28/20 0003 10/28/20 0454 10/29/20 0403  Weight: 87.4 kg 87.4 kg 87.5 kg   Examination:  Awake, patient is restless, moving his right hand, pointing towards right thigh for pain, he does have bright eyed scleral injection, he is with left-sided facial droop, neglect and hemiparesis.  Symmetrical Chest wall movement, Good air movement bilaterally, CTAB RRR,No Gallops,Rubs or new Murmurs, No Parasternal Heave +ve B.Sounds, Abd Soft, No tenderness, No rebound - guarding  or rigidity. No Cyanosis, Clubbing or edema, No new Rash or bruise      Data Reviewed: I have personally reviewed following labs and imaging studies:  CBC: Recent Labs  Lab 10/25/20 0030 10/26/20 0251 10/28/20 1121 10/29/20 0104 10/30/20 0035  WBC 5.7 5.8 4.9 6.8 6.4  HGB 13.1 12.1* 11.5* 12.4* 11.8*  HCT 41.1 38.6* 36.9* 40.2 37.6*  MCV 81.2 82.0 82.2 82.9 83.4  PLT 178 145* 130* 151 [id number]*   Basic Metabolic Panel: Recent Labs  Lab 10/23/20 1645 10/25/20 0030 10/26/20 0251 10/28/20 1121 10/29/20 0104 10/30/20 0035  NA  --  135 136 141 141 142  K  --  5.7* 4.3 4.3 4.7 4.2  CL  --  105 105 107 106 110  CO2  --  20* '23 26 26 26  '$ GLUCOSE  --  122* 112* 114* 125* 130*  BUN  --  30* 33* 41* 41* 46*  CREATININE  --  1.16 1.24 1.31* 1.28* 1.26*  CALCIUM  --  8.8* 9.0 9.1 9.5 9.2  MG 2.2  --   --   --   --   --   PHOS 2.7  --   --   --   --   --    GFR: Estimated Creatinine Clearance: 54.7 mL/min (A) (by C-G formula based on SCr of 1.26 mg/dL (H)). Liver Function Tests: No results  for input(s): AST, ALT, ALKPHOS, BILITOT, PROT, ALBUMIN in the last 168 hours. No results for input(s): LIPASE, AMYLASE in the last 168 hours. No results for input(s): AMMONIA in the last 168 hours. Coagulation Profile: No results for input(s): INR, PROTIME in the last 168 hours. Cardiac Enzymes: No results for input(s): CKTOTAL, CKMB, CKMBINDEX, TROPONINI in the last 168 hours. BNP (last 3 results) No results for input(s): PROBNP in the last 8760 hours. HbA1C: No results for input(s): HGBA1C in the last 72 hours. CBG: Recent Labs  Lab 10/29/20 2353 10/30/20 0342 10/30/20 0805 10/30/20 0940 10/30/20 1157  GLUCAP 127* 135* 132* 124* 125*   Lipid Profile: No results for input(s): CHOL, HDL, LDLCALC, TRIG, CHOLHDL, LDLDIRECT in the last 72 hours. Thyroid Function Tests: No results for input(s): TSH, T4TOTAL, FREET4, T3FREE, THYROIDAB in the last 72 hours. Anemia Panel: No results for input(s): VITAMINB12, FOLATE, FERRITIN, TIBC, IRON, RETICCTPCT in the last 72 hours. Urine analysis:    Component Value Date/Time   COLORURINE YELLOW 10/06/2020 [place] 10/06/2020 1353   LABSPEC 1.020 10/06/2020 1353   PHURINE 6.0 10/06/2020 1353   GLUCOSEU NEGATIVE 10/06/2020 1353   HGBUR TRACE (A) 10/06/2020 1353   BILIRUBINUR NEGATIVE 10/06/2020 1353   KETONESUR NEGATIVE 10/06/2020 1353   PROTEINUR NEGATIVE 10/06/2020 1353   NITRITE NEGATIVE 10/06/2020 1353   LEUKOCYTESUR SMALL (A) 10/06/2020 1353   Sepsis Labs: '@LABRCNTIP'$ (procalcitonin:4,lacticacidven:4)  )No results found for this or any previous visit (from the past 240 hour(s)).       Radiology Studies: No results found.      Scheduled Meds:  amLODipine  10 mg Per Tube Daily   atorvastatin  40 mg Per Tube Daily   erythromycin   Right Eye TID   feeding supplement (PROSource TF)  45 mL Per Tube BID   fiber  1 packet Per Tube Daily   insulin aspart  0-9 Units Subcutaneous Q4H   levETIRAcetam  500 mg Per  Tube BID   mouth rinse  15 mL Mouth Rinse BID   Continuous Infusions:  feeding supplement (OSMOLITE  1.5 CAL) 55 mL/hr at 10/29/20 1200   heparin 950 Units/hr (10/30/20 1248)     LOS: 16 days       Phillips Climes, MD [name] 10/30/2020, 2:27 PM     Please page though [place] or Epic secure chat:  For [employer], Adult nurse

## 2020-10-30 NOTE — Progress Notes (Signed)
ANTICOAGULATION CONSULT NOTE  Pharmacy Consult for IV Heparin  Indication: history of pulmonary embolus  Allergies  Allergen Reactions   Apixaban     Other reaction(s): Liver enzymes abnormal   Lisinopril     Other reaction(s): Cough (finding)   Simvastatin     Other reaction(s): Pain in lower limb (finding)    Patient Measurements: Height: '5\' 5"'$  (165.1 cm) Weight: 87.5 kg (192 lb 14.4 oz) IBW/kg (Calculated) : 61.5 Heparin Dosing Weight: 82.5 kg  Vital Signs: Temp: 98.8 F (37.1 C) (08/27 1204) Temp Source: Oral (08/27 1204) BP: 151/68 (08/27 2026) Pulse Rate: 69 (08/27 2026)  Labs: Recent Labs    10/28/20 1121 10/29/20 0104 10/29/20 0958 10/30/20 0035 10/30/20 1021 10/30/20 2021  HGB 11.5* 12.4*  --  11.8*  --   --   HCT 36.9* 40.2  --  37.6*  --   --   PLT 130* 151  --  139*  --   --   HEPARINUNFRC  --  0.63   < > 0.48 0.34 0.43  CREATININE 1.31* 1.28*  --  1.26*  --   --    < > = values in this interval not displayed.    Estimated Creatinine Clearance: 54.7 mL/min (A) (by C-G formula based on SCr of 1.26 mg/dL (H)).  Assessment: 70 y.o. male with stroke complicated by petechial hemorrhage with hx PE, Pradaxa on hold, now on IV heparin.  Pt was on Lovenox 1 mg/kg SQ q12h from 8/19-8/25, last dose 8/25 at 0500.    Heparin level therapeutic at 0.43.   Goal of Therapy:  Heparin level 0.3-0.5 units/ml Monitor platelets by anticoagulation protocol: Yes   Plan:  Heparin 950 units/h Daily heparin level and CBC  Arrie Senate, PharmD, [place], [facility] Clinical Pharmacist [phone] Please check AMION for all [place] numbers 10/30/2020

## 2020-10-31 DIAGNOSIS — Z86711 Personal history of pulmonary embolism: Secondary | ICD-10-CM | POA: Diagnosis not present

## 2020-10-31 DIAGNOSIS — Z7901 Long term (current) use of anticoagulants: Secondary | ICD-10-CM | POA: Diagnosis not present

## 2020-10-31 LAB — CBC
HCT: 40.2 % (ref 39.0–52.0)
Hemoglobin: 12.1 g/dL — ABNORMAL LOW (ref 13.0–17.0)
MCH: 25.5 pg — ABNORMAL LOW (ref 26.0–34.0)
MCHC: 30.1 g/dL (ref 30.0–36.0)
MCV: 84.6 fL (ref 80.0–100.0)
Platelets: 144 10*3/uL — ABNORMAL LOW (ref 150–400)
RBC: 4.75 MIL/uL (ref 4.22–5.81)
RDW: 14.8 % (ref 11.5–15.5)
WBC: 7.6 10*3/uL (ref 4.0–10.5)
nRBC: 0 % (ref 0.0–0.2)

## 2020-10-31 LAB — GLUCOSE, CAPILLARY
Glucose-Capillary: 154 mg/dL — ABNORMAL HIGH (ref 70–99)
Glucose-Capillary: 134 mg/dL — ABNORMAL HIGH (ref 70–99)
Glucose-Capillary: 122 mg/dL — ABNORMAL HIGH (ref 70–99)
Glucose-Capillary: 132 mg/dL — ABNORMAL HIGH (ref 70–99)
Glucose-Capillary: 103 mg/dL — ABNORMAL HIGH (ref 70–99)
Glucose-Capillary: 127 mg/dL — ABNORMAL HIGH (ref 70–99)

## 2020-10-31 LAB — BASIC METABOLIC PANEL
Anion gap: 10 (ref 5–15)
BUN: 55 mg/dL — ABNORMAL HIGH (ref 8–23)
CO2: 25 mmol/L (ref 22–32)
Calcium: 9.8 mg/dL (ref 8.9–10.3)
Chloride: 109 mmol/L (ref 98–111)
Creatinine, Ser: 1.33 mg/dL — ABNORMAL HIGH (ref 0.61–1.24)
GFR, Estimated: 57 mL/min — ABNORMAL LOW (ref >60)
Glucose, Bld: 137 mg/dL — ABNORMAL HIGH (ref 70–99)
Potassium: 4.7 mmol/L (ref 3.5–5.1)
Sodium: 144 mmol/L (ref 135–145)

## 2020-10-31 LAB — HEPARIN LEVEL (UNFRACTIONATED): Heparin Unfractionated: 0.45 IU/mL (ref 0.30–0.70)

## 2020-10-31 NOTE — Plan of Care (Signed)

## 2020-10-31 NOTE — Progress Notes (Signed)
[facility]   INTERIM SUMMARY      Clifford [name]  [id number] DOB: 09-08-1950 DOA: 10/14/2020 PCP: Pcp, No      Brief Narrative:   Clifford [name] is a 71 y.o. 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    Had an initial stroke 8/3, was admitted 2 days, then discharged with [facility].  After about 4 days at home, he developed worsening of his stroke symptoms, returned to the hospital.  In the ER, found to have enlargement of his previous stroke with micro-hemorrhage.  Initially had relatively mild deficits still, NIHSS 4, so his Pradaxa was held and he was admitted for SNF placement for short term rehab.    Here, however, he developed progressively worsening left hemiparesis, then somnolence (see NIHSS chart in progress note 8/24).  Neurology were reconsulted.  Serial head CTs on 8/16 showed expected evolution/progression of his stroke, further edema, but no herniation or substantial midline shift.  Neurology followed the patient and recommended further supportive care and SNF placement.  An NG tube was placed as the patient lost the ability to swallow.  Palliative Care were consulted and in detailed family discussions, with prognosis provided by Neurology (more likely able to regain ability to talk and participate in self cares, unlikely to regain ability to walk, uncertain if able to regain ability to swallow, very unlikely to improve to full independence), it was decided to place PEG and pursue SNF rehab.  The patient may need to improve somewhat from his current state in order to be able to engage with therapy prior to discharge but we believe he will and then can qualify for STR with Palliative following.         Assessment & Plan:    Increased petechial hemorrhage/evolution of previous right MCA/PCA territory  infarct with mild edema with now increasing midline shift on repeat MRI 10/28/2020 - MRI mild enlargement with cytotoxic edema and petechial hemorrhage with increased midline shift compared to 10/07/20. - Neurology suspect his current poor functional status is mostly due to swelling, with a component of this from free water and a component of this from bleeding.  They feel this is within the range of expected, and that it is likely to improve, with prognosis outlined above.   -He remains with significant dysphagia, will request PEG placement.  In light of this potential for recovery, Neurology recommend: - Continue atorvastatin - Hold free water to minimize cerebral edema, if Na or Cr rise, would use saline , sodium is 144 today, no need for any fluids yets. - Continue supportive care and serial neurological exams -repeat CT heasd is pending for tomorrow per neurology recommendations    Hypertension BP mostly controlled - Continue amlodipine  Suspected focal seizures Presented with left hand twitching which was nonsuppressible and clinically suspicious for stroke, and was started on Keppra.    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  Last was >6 months ago, family uncertain when.  Had been on long term warfarin prior to Aug 3 stroke, discharged from that initial hospitalization on Pradaxa, held at admission.  Started on HD4, then transitioned to Lovenox when PEG  placed.   - Continue heparin gtt - Plan for Eliquis after PEG placement   Lung nodule Incidental finding of 46m LUL nodule.  Has not been discussed with family. Patient former smoker. - Recommend follow up CT in 6-12 months depending on neurological progressio   Possible sclerotic vertebra on imaging Incidental finding.  PSA 5, unlikely this sclerosis is from metastatic prostate cancer with that level.  Chronic diastolic CHF  Appears euvolemic.  Not on diuretic at baseline  Hyperkalemia Resolved with [facility] Right eye pain -With some scleral injection, discussed with ophthalmology, likely due to abrasion from stroke and it remains open, started on erythromycin ointment per ophthalmology recommendation, she did report significant improvement after ointment placement, actually he denies any pain today.            Disposition: Status is: Inpatient  Remains inpatient appropriate because:IV treatments appropriate due to intensity of illness or inability to take PO  Dispo: The patient is from: Home              Anticipated d/c is to:  TBD              Patient currently is not medically stable to d/c.   Difficult to place patient No   Patient admitted with worsening stroke symptoms.  Has had progressively worsening mentation and symptoms since hospital day 3, likely due to cerebral edema, due to evolution and microhemorrhage of his initial infarct.    Will continue supportive cares, with expectation that his swelling will subside int he coming week and he will be able to engage in therapy, have PEG placed, and transition to short term rehab to regain the ability to speak and swallow.           Level of care: Telemetry Medical       MDM: The below labs and imaging reports were reviewed and summarized above.  Medication management as above.    DVT prophylaxis: Heparin GTT  Code Status: FULL Family Communication: None at bedside  today.     Subjective:  Denies any right thigh pain today, report he is thirsty and wants to drink fluid, I have explained for him the rationale behind n.p.o. status, and cortrak tube.  Objective: Vitals:   10/30/20 2348 10/31/20 0348 10/31/20 0732 10/31/20 1217  BP: (!) 143/69 (!) 143/58 (!) 143/70 (!) 157/79  Pulse: (!) 53 (!) 59 (!) 58 61  Resp: '16 16 18 18  '$ Temp: 98.7 F (37.1 C) 97.7 F (36.5 C) 98.1 F (36.7 C) 98.7 F (37.1 C)  TempSrc: Axillary Oral Axillary Axillary  SpO2: 99% 100% 100% 100%  Weight:      Height:        Intake/Output Summary (Last 24 hours) at 10/31/2020 1535 Last data filed at 10/31/2020 0437 Gross per 24 hour  Intake --  Output 400 ml  Net -400 ml    Filed Weights   10/28/20 0003 10/28/20 0454 10/29/20 0403  Weight: 87.4 kg 87.4 kg 87.5 kg   Examination:  Awake Alert, speech is more coherent today, he remains with dense left-sided  weakness.   RRR,No Gallops,Rubs or new Murmurs, No Parasternal Heave +ve B.Sounds, Abd Soft, No tenderness, No rebound - guarding or rigidity. Abdomen soft, nontender. No Cyanosis, Clubbing or edema, No new Rash or bruise       Data Reviewed: I have personally reviewed following labs and imaging studies:  CBC: Recent Labs  Lab 10/26/20 0251 10/28/20 1121 10/29/20 0104 10/30/20 0035 10/31/20 0047  WBC 5.8 4.9 6.8 6.4 7.6  HGB 12.1* 11.5* 12.4* 11.8* 12.1*  HCT 38.6* 36.9* 40.2 37.6* 40.2  MCV 82.0 82.2 82.9 83.4 84.6  PLT 145* 130* 151 139* [id number]*   Basic Metabolic Panel: Recent Labs  Lab 10/26/20 0251 10/28/20 1121 10/29/20 0104 10/30/20 0035 10/31/20 0047  NA 136 141 141 142 144  K 4.3 4.3 4.7 4.2 4.7  CL 105 107 106 110 109  CO2 '23 26 26 26 25  '$ GLUCOSE 112* 114* 125* 130* 137*  BUN 33* 41* 41* 46* 55*  CREATININE 1.24 1.31* 1.28* 1.26* 1.33*  CALCIUM 9.0 9.1 9.5 9.2 9.8   GFR: Estimated Creatinine Clearance: 51.8 mL/min (A) (by C-G formula based on SCr of 1.33 mg/dL (H)). Liver  Function Tests: No results for input(s): AST, ALT, ALKPHOS, BILITOT, PROT, ALBUMIN in the last 168 hours. No results for input(s): LIPASE, AMYLASE in the last 168 hours. No results for input(s): AMMONIA in the last 168 hours. Coagulation Profile: No results for input(s): INR, PROTIME in the last 168 hours. Cardiac Enzymes: No results for input(s): CKTOTAL, CKMB, CKMBINDEX, TROPONINI in the last 168 hours. BNP (last 3 results) No results for input(s): PROBNP in the last 8760 hours. HbA1C: No results for input(s): HGBA1C in the last 72 hours. CBG: Recent Labs  Lab 10/30/20 2010 10/30/20 2348 10/31/20 0348 10/31/20 0733 10/31/20 1232  GLUCAP 150* 154* 154* 134* 122*   Lipid Profile: No results for input(s): CHOL, HDL, LDLCALC, TRIG, CHOLHDL, LDLDIRECT in the last 72 hours. Thyroid Function Tests: No results for input(s): TSH, T4TOTAL, FREET4, T3FREE, THYROIDAB in the last 72 hours. Anemia Panel: No results for input(s): VITAMINB12, FOLATE, FERRITIN, TIBC, IRON, RETICCTPCT in the last 72 hours. Urine analysis:    Component Value Date/Time   COLORURINE YELLOW 10/06/2020 [place] 10/06/2020 1353   LABSPEC 1.020 10/06/2020 1353   PHURINE 6.0 10/06/2020 1353   GLUCOSEU NEGATIVE 10/06/2020 1353   HGBUR TRACE (A) 10/06/2020 1353   BILIRUBINUR NEGATIVE 10/06/2020 1353   KETONESUR NEGATIVE 10/06/2020 1353   PROTEINUR NEGATIVE 10/06/2020 1353   NITRITE NEGATIVE 10/06/2020 1353   LEUKOCYTESUR SMALL (A) 10/06/2020 1353   Sepsis Labs: '@LABRCNTIP'$ (procalcitonin:4,lacticacidven:4)  )No results found for this or any previous visit (from the past 240 hour(s)).       Radiology Studies: No results found.      Scheduled Meds:  amLODipine  10 mg Per Tube Daily   atorvastatin  40 mg Per Tube Daily   erythromycin   Right Eye TID   feeding supplement (PROSource TF)  45 mL Per Tube BID   fiber  1 packet Per Tube Daily   insulin aspart  0-9 Units Subcutaneous Q4H    levETIRAcetam  500 mg Per Tube BID   mouth rinse  15 mL Mouth Rinse BID   pantoprazole sodium  40 mg Per Tube Daily   Continuous Infusions:  feeding supplement (OSMOLITE 1.5 CAL) 55 mL/hr at 10/29/20 1200   heparin 950 Units/hr (10/30/20 1248)     LOS: 17 days  Phillips Climes, MD [facility] 10/31/2020, 3:35 PM     Please page though [place] or Epic secure chat:  For [employer], Adult nurse

## 2020-10-31 NOTE — Progress Notes (Signed)
[place] for IV Heparin  Indication: history of pulmonary embolus  Allergies  Allergen Reactions   Apixaban     Other reaction(s): Liver enzymes abnormal   Lisinopril     Other reaction(s): Cough (finding)   Simvastatin     Other reaction(s): Pain in lower limb (finding)    Patient Measurements: Height: '5\' 5"'$  (165.1 cm) Weight: 87.5 kg (192 lb 14.4 oz) IBW/kg (Calculated) : 61.5 Heparin Dosing Weight: 82.5 kg  Vital Signs: Temp: 98.1 F (36.7 C) (08/28 0732) Temp Source: Axillary (08/28 0732) BP: 143/70 (08/28 0732) Pulse Rate: 58 (08/28 0732)  Labs: Recent Labs    10/28/20 1121 10/29/20 0104 10/29/20 0958 10/30/20 0035 10/30/20 1021 10/30/20 2021 10/31/20 0047  HGB 11.5* 12.4*  --  11.8*  --   --  12.1*  HCT 36.9* 40.2  --  37.6*  --   --  40.2  PLT 130* 151  --  139*  --   --  144*  HEPARINUNFRC  --  0.63   < > 0.48 0.34 0.43 0.45  CREATININE 1.31* 1.28*  --  1.26*  --   --   --    < > = values in this interval not displayed.    Estimated Creatinine Clearance: 54.7 mL/min (A) (by C-G formula based on SCr of 1.26 mg/dL (H)).  Assessment: 70 y.o. male with stroke complicated by petechial hemorrhage with hx PE, Pradaxa on hold, now on IV heparin. Pt was on Lovenox 1 mg/kg SQ q12h from 8/19-8/25, last dose 8/25 at 0500.    Heparin level increased 8/27 900 > 950 units/hr. Heparin levels remain in therapeutic goal range of 0.3-0.5. H/H stable at 12.1/50.2, plt 144. Per RN, no issues with IV or bleeding observed. No changes needed at this time.  Goal of Therapy:  Heparin level 0.3-0.5 units/ml Monitor platelets by anticoagulation protocol: Yes   Plan:  - Continue heparin @ 950 units/hr - Monitor daily heparin level, CBC - Monitor for bleeding - F/U plan for oral anticoagulant after PEG placement next week   Thank you for allowing pharmacy to be a part of this patient's care.  Ardyth Harps, PharmD Clinical  Pharmacist

## 2020-11-01 ENCOUNTER — Inpatient Hospital Stay (HOSPITAL_COMMUNITY): Payer: No Typology Code available for payment source

## 2020-11-01 DIAGNOSIS — R1310 Dysphagia, unspecified: Secondary | ICD-10-CM

## 2020-11-01 DIAGNOSIS — R531 Weakness: Secondary | ICD-10-CM | POA: Diagnosis not present

## 2020-11-01 LAB — BASIC METABOLIC PANEL
Anion gap: 8 (ref 5–15)
BUN: 54 mg/dL — ABNORMAL HIGH (ref 8–23)
CO2: 23 mmol/L (ref 22–32)
Calcium: 9.3 mg/dL (ref 8.9–10.3)
Chloride: 113 mmol/L — ABNORMAL HIGH (ref 98–111)
Creatinine, Ser: 1.22 mg/dL (ref 0.61–1.24)
GFR, Estimated: >60 mL/min (ref >60)
Glucose, Bld: 134 mg/dL — ABNORMAL HIGH (ref 70–99)
Potassium: 4.2 mmol/L (ref 3.5–5.1)
Sodium: 144 mmol/L (ref 135–145)

## 2020-11-01 LAB — GLUCOSE, CAPILLARY
Glucose-Capillary: 127 mg/dL — ABNORMAL HIGH (ref 70–99)
Glucose-Capillary: 127 mg/dL — ABNORMAL HIGH (ref 70–99)
Glucose-Capillary: 117 mg/dL — ABNORMAL HIGH (ref 70–99)
Glucose-Capillary: 152 mg/dL — ABNORMAL HIGH (ref 70–99)
Glucose-Capillary: 157 mg/dL — ABNORMAL HIGH (ref 70–99)

## 2020-11-01 LAB — CBC
HCT: 40.2 % (ref 39.0–52.0)
Hemoglobin: 12.4 g/dL — ABNORMAL LOW (ref 13.0–17.0)
MCH: 25.8 pg — ABNORMAL LOW (ref 26.0–34.0)
MCHC: 30.8 g/dL (ref 30.0–36.0)
MCV: 83.8 fL (ref 80.0–100.0)
Platelets: 149 10*3/uL — ABNORMAL LOW (ref 150–400)
RBC: 4.8 MIL/uL (ref 4.22–5.81)
RDW: 15.1 % (ref 11.5–15.5)
WBC: 7 10*3/uL (ref 4.0–10.5)
nRBC: 0 % (ref 0.0–0.2)

## 2020-11-01 LAB — HEPARIN LEVEL (UNFRACTIONATED)
Heparin Unfractionated: 0.62 IU/mL (ref 0.30–0.70)
Heparin Unfractionated: 0.62 IU/mL (ref 0.30–0.70)

## 2020-11-01 MED ORDER — HYDRALAZINE HCL 25 MG PO TABS
25.0000 mg | ORAL_TABLET | Freq: Four times a day (QID) | ORAL | Status: DC
  Administered 2020-11-01 – 2020-11-30 (×111): 25 mg
  Filled 2020-11-01 (×108): qty 1

## 2020-11-01 MED ORDER — METHYLPREDNISOLONE SODIUM SUCC 125 MG IJ SOLR
80.0000 mg | Freq: Once | INTRAMUSCULAR | Status: AC
  Administered 2020-11-01: 80 mg via INTRAVENOUS
  Filled 2020-11-01: qty 2

## 2020-11-01 MED ORDER — TOPIRAMATE 25 MG PO TABS
50.0000 mg | ORAL_TABLET | Freq: Two times a day (BID) | ORAL | Status: DC
  Administered 2020-11-01 – 2020-12-14 (×85): 50 mg
  Filled 2020-11-01 (×86): qty 2

## 2020-11-01 MED ORDER — CEFAZOLIN SODIUM-DEXTROSE 2-4 GM/100ML-% IV SOLN
2.0000 g | INTRAVENOUS | Status: AC
  Administered 2020-11-02: 2 g via INTRAVENOUS
  Filled 2020-11-01: qty 100

## 2020-11-01 MED ORDER — IOHEXOL 300 MG/ML  SOLN
75.0000 mL | Freq: Once | INTRAMUSCULAR | Status: AC | PRN
  Administered 2020-11-01: 75 mL
  Filled 2020-11-01 (×3): qty 100

## 2020-11-01 NOTE — Progress Notes (Signed)
PT Cancellation Note  Patient Details Name: Clifford [name] MRN: [id number] DOB: April 20, 1950   Cancelled Treatment:    Reason Eval/Treat Not Completed: Patient at procedure or test/unavailable. Pt currently off unit. Will check back as schedule allows to continue with PT POC.   Thelma Comp 11/01/2020, 1:10 PM  Rolinda Roan, PT, DPT Acute Rehabilitation Services Pager: [phone] Office: [phone]

## 2020-11-01 NOTE — Plan of Care (Signed)
  Problem: Self-Care: Goal: Ability to communicate needs accurately will improve Outcome: Progressing   Problem: Nutrition: Goal: Risk of aspiration will decrease Outcome: Progressing Goal: Dietary intake will improve Outcome: Progressing

## 2020-11-01 NOTE — Plan of Care (Signed)
  Problem: Clinical Measurements: Goal: Ability to maintain clinical measurements within normal limits will improve Outcome: Progressing Goal: Will remain free from infection Outcome: Progressing Goal: Diagnostic test results will improve Outcome: Progressing Goal: Respiratory complications will improve Outcome: Progressing Goal: Cardiovascular complication will be avoided Outcome: Progressing   Problem: Nutrition: Goal: Adequate nutrition will be maintained Outcome: Progressing   Problem: Coping: Goal: Level of anxiety will decrease Outcome: Progressing   Problem: Elimination: Goal: Will not experience complications related to bowel motility Outcome: Progressing Goal: Will not experience complications related to urinary retention Outcome: Progressing   Problem: Pain Managment: Goal: General experience of comfort will improve Outcome: Progressing   Problem: Safety: Goal: Ability to remain free from injury will improve Outcome: Progressing   Problem: Nutrition: Goal: Risk of aspiration will decrease Outcome: Progressing Goal: Dietary intake will improve Outcome: Progressing   Problem: Intracerebral Hemorrhage Tissue Perfusion: Goal: Complications of Intracerebral Hemorrhage will be minimized Outcome: Progressing   Problem: Ischemic Stroke/TIA Tissue Perfusion: Goal: Complications of ischemic stroke/TIA will be minimized Outcome: Progressing   Problem: Spontaneous Subarachnoid Hemorrhage Tissue Perfusion: Goal: Complications of Spontaneous Subarachnoid Hemorrhage will be minimized Outcome: Progressing

## 2020-11-01 NOTE — Progress Notes (Signed)
[place] for IV Heparin  Indication: history of pulmonary embolus  Allergies  Allergen Reactions   Apixaban     Other reaction(s): Liver enzymes abnormal   Lisinopril     Other reaction(s): Cough (finding)   Simvastatin     Other reaction(s): Pain in lower limb (finding)    Patient Measurements: Height: '5\' 5"'$  (165.1 cm) Weight: 87.3 kg (192 lb 7.4 oz) IBW/kg (Calculated) : 61.5 Heparin Dosing Weight: 82.5 kg  Vital Signs: Temp: 99 F (37.2 C) (08/29 0729) Temp Source: Axillary (08/29 0729) BP: 159/70 (08/29 0729) Pulse Rate: 76 (08/29 0729)  Labs: Recent Labs    10/30/20 0035 10/30/20 1021 10/30/20 2021 10/31/20 0047 11/01/20 0123  HGB 11.8*  --   --  12.1* 12.4*  HCT 37.6*  --   --  40.2 40.2  PLT 139*  --   --  144* 149*  HEPARINUNFRC 0.48   < > 0.43 0.45 0.62  CREATININE 1.26*  --   --  1.33* 1.22   < > = values in this interval not displayed.    Estimated Creatinine Clearance: 56.4 mL/min (by C-G formula based on SCr of 1.22 mg/dL).  Assessment: 70 y.o. male with stroke complicated by petechial hemorrhage with hx PE, Pradaxa on hold, now on IV heparin. Pt was on Lovenox 1 mg/kg SQ q12h from 8/19-8/25, last dose 8/25 at 0500.    Heparin level increased 8/27 900 > 950 units/hr. Heparin levels above goal range @ 0.67. Will decrease slightly back to 900 units/hr to target goal range of 0.3-0.5. H/H stable at 12.4/40.2, plt 149. Per RN, no issues with IV or bleeding observed. No changes needed at this time.  Goal of Therapy:  Heparin level 0.3-0.5 units/ml Monitor platelets by anticoagulation protocol: Yes   Plan:  - Decrease heparin to 900 units/hr - Check heparin level @ 1700 - Monitor daily heparin level, and CBC - Monitor for bleeding - F/U plan for oral anticoagulant after PEG placement next week   Thank you for allowing pharmacy to be a part of this patient's care.  Ardyth Harps, PharmD Clinical  Pharmacist

## 2020-11-01 NOTE — Consult Note (Signed)
Chief Complaint: Patient was seen in consultation today for percutaneous gastric tube placement Chief Complaint  Patient presents with   Altered Mental Status   at the request of Dr Noelle Penner  Supervising Physician: Markus Daft  Patient Status: [facility] - In-pt  History of Present Illness: TORON BALENT is a 70 y.o. 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  Dysphagia Deconditioning Wt loss Failed swallow tests--- Rec: npo Has been on coumadin long term VTE Transitioned to lovenox--- now on Hep drip Plan for Eliquis once G tube in place Afebrile INR 1.4 10/14/20  Imaging has been approved for G tube placement  Plan for possible IR placement 8/30   Past Medical History:  Diagnosis Date   BPH (benign prostatic hyperplasia)    Chronic anticoagulation 10/14/2020   Essential hypertension 10/06/2020   History of pulmonary embolism 10/14/2020   HLD (hyperlipidemia)    HTN (hypertension)    Lung nodule    86m in 2015   [place]    Mixed hyperlipidemia 10/06/2020   Nicotine dependence, cigarettes, uncomplicated [id number]  Peripheral vascular disease ([facility] [place]    Polyp of colon    Prediabetes 10/14/2020   Pulmonary embolus ([facility]    Stroke ([place] 10/06/2020    Past Surgical History:  Procedure Laterality Date   COLONOSCOPY  03/2017   PARTIAL NEPHRECTOMY Left 02/2018   PROSTATE BIOPSY  2015    Allergies: Apixaban, Lisinopril, and Simvastatin  Medications: Prior to Admission medications   Medication Sig Start Date End Date Taking? Authorizing Provider  atorvastatin (LIPITOR) 80 MG tablet Take 40 mg by mouth 1 day or 1 dose. 12/04/19  Yes [provider]  dabigatran (PRADAXA) 150 MG CAPS capsule Take 1 capsule (150 mg total) by mouth 2 (two) times daily. 10/08/20  Yes Elgergawy, DSilver Huguenin MD  finasteride (PROSCAR) 5 MG tablet Take 5 mg by mouth daily at 6 (six) AM. 08/05/20  Yes [provider]  hydrochlorothiazide (HYDRODIURIL) 25  MG tablet Take 25 mg by mouth daily.   Yes [provider]  losartan (COZAAR) 50 MG tablet Take 100 mg by mouth daily.   Yes [provider]  potassium chloride SA (KLOR-CON) 20 MEQ tablet Take 10 mEq by mouth daily. 12/04/19  Yes [provider]  psyllium (METAMUCIL) 58.6 % packet Take 1 packet by mouth daily. 04/08/20  Yes [provider]     Family History  Problem Relation Age of Onset   Clotting disorder Neg Hx     Social History   Socioeconomic History   Marital status: Single    Spouse name: Not on file   Number of children: Not on file   Years of education: Not on file   Highest education level: Not on file  Occupational History   Not on file  Tobacco Use   Smoking status: Every Day    Packs/day: 0.50    Types: Cigarettes   Smokeless tobacco: Never  Vaping Use   Vaping Use: Never used  Substance and Sexual Activity   Alcohol use: Never   Drug use: Never   Sexual activity: Not on file  Other Topics Concern   Not on file  Social History Narrative   Not on file   Social Determinants of Health   Financial Resource Strain: Not on file  Food Insecurity: Not on file  Transportation Needs: Not on file  Physical Activity: Not on file  Stress: Not on  file  Social Connections: Not on file    Review of Systems: A 12 point ROS discussed and pertinent positives are indicated in the HPI above.  All other systems are negative.    Vital Signs: BP (!) 159/70   Pulse 76   Temp 99 F (37.2 C) (Axillary)   Resp 18   Ht '5\' 5"'$  (1.651 m)   Wt 192 lb 7.4 oz (87.3 kg)   SpO2 96%   BMI 32.03 kg/m   Physical Exam Constitutional:      Appearance: He is ill-appearing.  HENT:     Mouth/Throat:     Mouth: Mucous membranes are moist.  Cardiovascular:     Rate and Rhythm: Normal rate and regular rhythm.     Heart sounds: Normal heart sounds.  Pulmonary:     Breath sounds: Normal breath sounds.  Abdominal:     Palpations: Abdomen is  soft.  Skin:    General: Skin is warm.  Neurological:     Comments: Does not follow commands Moving all over bed-- in restraints    Psychiatric:     Comments: Spoke to brother for consent--- he has consented to procedure Will continue to try to get pts Dtr via phone    Imaging: CT ABDOMEN WO CONTRAST  Result Date: 10/28/2020 CLINICAL DATA:  History of stroke. Evaluate anatomy for gastrostomy tube placement. EXAM: CT ABDOMEN WITHOUT CONTRAST TECHNIQUE: Multidetector CT imaging of the abdomen was performed following the standard protocol without IV contrast. COMPARISON:  None. FINDINGS: Lower chest: 8 mm round nodule along the periphery of the left upper lobe on sequence 5 image 4. Pleural-based densities in the left lower lobe could represent atelectasis or scarring. No large pleural effusions. Hepatobiliary: 1.2 cm low-density structure along the hepatic dome is likely an incidental finding such as a cyst. Normal appearance of the gallbladder. Pancreas: Unremarkable. No pancreatic ductal dilatation or surrounding inflammatory changes. Spleen: Normal in size without focal abnormality. Adrenals/Urinary Tract: Normal adrenal glands. Exophytic low-density structure in the anterior right kidney probably represents a cyst. Significant motion artifact in the kidneys. There is an additional cyst in the right kidney lower pole measuring up to 3.7 cm. Peripheral calcifications along the lateral aspect of the left kidney that are poorly characterized due to motion artifact. Negative for hydronephrosis. Stomach/Bowel: Currently, the transverse colon is anterior to the distal stomach body. There is a feeding tube that extends into the stomach and proximal duodenum. There is a very small percutaneous window between the transverse colon and the splenic flexure for percutaneous gastrostomy tube access. No evidence for bowel inflammation or obstruction. Vascular/Lymphatic: Diffuse atherosclerotic calcifications in  the aorta and iliac arteries without an aortic aneurysm. No significant lymph node enlargement in the abdomen. Prominent coronary artery calcifications in the LAD distribution. Other: Negative for ascites. Musculoskeletal: Poorly defined sclerosis in the L3 vertebral body is nonspecific. Degenerative facet disease in lower lumbar spine. Poorly defined sclerosis involving the posterior aspect of S1. IMPRESSION: 1. Small percutaneous window for gastrostomy tube placement between the transverse colon and the splenic flexure as described. Anatomy is amendable for percutaneous gastrostomy tube placement but consider visualization of the transverse colon during gastrostomy tube placement. 2. **An incidental finding of potential clinical significance has been found. Indeterminate 8 mm nodule in the left upper lobe. No comparison imaging to evaluate for stability. Non-contrast chest CT at 6-12 months is recommended. If the nodule is stable at time of repeat CT, then future CT at 18-24 months (from  today's scan) is considered optional for low-risk patients, but is recommended for high-risk patients. This recommendation follows the consensus statement: Guidelines for Management of Incidental Pulmonary Nodules Detected on CT Images: From the Fleischner Society 2017; Radiology 2017; 284:228-243.** 3. Indeterminate sclerosis involving the L3 vertebral body. Consider correlating with PSA level. 4. Indeterminate calcifications along the periphery of the left kidney. This area is very poorly characterized due to excessive motion artifact. This could be related to old hemorrhage, trauma or even previous surgery. Electronically Signed   By: Markus Daft M.D.   On: 10/28/2020 08:05   CT Angio Head W or Wo Contrast  Result Date: 10/06/2020 CLINICAL DATA:  Stroke. TIA. Assess intracranial arteries. Unsteady gait. Left-sided weakness. Left-sided visual loss. Headache. EXAM: CT ANGIOGRAPHY HEAD AND NECK TECHNIQUE: Multidetector CT  imaging of the head and neck was performed using the standard protocol during bolus administration of intravenous contrast. Multiplanar CT image reconstructions and MIPs were obtained to evaluate the vascular anatomy. Carotid stenosis measurements (when applicable) are obtained utilizing NASCET criteria, using the distal internal carotid diameter as the denominator. CONTRAST:  175m OMNIPAQUE IOHEXOL 350 MG/ML SOLN COMPARISON:  Head CT earlier same day FINDINGS: CTA NECK FINDINGS Aortic arch: Aortic atherosclerosis.  Branching pattern is normal. Right carotid system: Common carotid artery is tortuous but widely patent to the bifurcation. Carotid bifurcation is widely patent. Minimal calcified plaque at the distal ICA bulb but no stenosis. Cervical ICA widely patent. Left carotid system: Common carotid artery is tortuous but widely patent to the bifurcation. Carotid bifurcation is normal. Minimal calcified plaque at the ICA bulb but no stenosis. Cervical ICA widely patent beyond that. Vertebral arteries: Right vertebral artery origin is widely patent. Left vertebral artery origin is poorly seen because of regional venous reflux. Both vertebral arteries do appear patent through the cervical region to the foramen magnum. Skeleton: Ordinary cervical spondylosis. Other neck: No mass or lymphadenopathy. Upper chest: Upper lungs are clear. Review of the MIP images confirms the above findings CTA HEAD FINDINGS Anterior circulation: Both internal carotid arteries are patent through the skull base and siphon regions. There is ordinary siphon atherosclerotic calcification. On the left, there is no stenosis. On the right, there is 50-70% stenosis in the siphon. Supraclinoid internal carotid arteries are widely patent. The anterior and middle cerebral vessels are patent. No large vessel occlusion, with particular attention to the right MCA. Posterior circulation: Both vertebral arteries are patent through the foramen magnum to  the basilar. No basilar stenosis. Posterior circulation branch vessels are normal. Venous sinuses: Patent and normal. Anatomic variants: None significant. Review of the MIP images confirms the above findings IMPRESSION: No intracranial large vessel occlusion identified at this time, with specific attention to the right MCA branches. Minimal atherosclerotic change at both internal carotid artery bulbs. No stenosis. 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 No stenosis on the left. 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 Electronically Signed   By: MNelson ChimesM.D.   On: 10/06/2020 15:01   CT HEAD WO CONTRAST (5MM)  Result Date: 10/19/2020 CLINICAL DATA:  Follow-up stroke with altered mental status, initial encounter EXAM: CT HEAD WITHOUT CONTRAST TECHNIQUE: Contiguous axial images were obtained from the base of the skull through the vertex without intravenous contrast. COMPARISON:  CT from earlier in the same day. FINDINGS: Brain: There are again changes of edema and decreased attenuation in the distribution of the right posterior middle cerebral artery consistent with prior infarct. There is again noted some  effacement of the right lateral ventricle as well as mild midline shift from right to left of approximately 4 mm. No acute hemorrhage is seen. No new infarct is noted. Vascular: No hyperdense vessel or unexpected calcification. Skull: Normal. Negative for fracture or focal lesion. Sinuses/Orbits: No acute finding. Other: None. IMPRESSION: Evolving infarct involving the posterior aspect of the right middle cerebral artery. Persistent mass effect upon the right lateral ventricle is noted with mild midline shift. No new focal abnormality is seen. No herniation is seen. Electronically Signed   By: Inez Catalina M.D.   On: 10/19/2020 19:28   CT HEAD WO CONTRAST (5MM)  Result Date: 10/19/2020 CLINICAL DATA:  Stroke follow-up EXAM: CT HEAD WITHOUT CONTRAST TECHNIQUE: Contiguous axial  images were obtained from the base of the skull through the vertex without intravenous contrast. COMPARISON:  None. FINDINGS: Brain: Evolving right posterior MCA territory infarct with increased area of cytotoxic edema now involving a greater portion of the temporal lobe. There is moderate mass effect on the right lateral ventricle. No hydrocephalus or entrapment. There is now leftward midline shift approximately 3 mm but no herniation. No acute hemorrhage. Vascular: Atherosclerotic calcification of the internal carotid arteries at the skull base. No abnormal hyperdensity of the major intracranial arteries or dural venous sinuses. Skull: The visualized skull base, calvarium and extracranial soft tissues are normal. Sinuses/Orbits: No fluid levels or advanced mucosal thickening of the visualized paranasal sinuses. No mastoid or middle ear effusion. The orbits are normal. IMPRESSION: 1. Evolving right posterior MCA territory infarct with increased area of cytotoxic edema now involving a greater portion of the temporal lobe. 2. No acute hemorrhage. 3. Moderate mass effect on the right lateral ventricle with 3 mm leftward midline shift but no herniation. Electronically Signed   By: Ulyses Jarred M.D.   On: 10/19/2020 03:11   CT HEAD WO CONTRAST (5MM)  Result Date: 10/15/2020 CLINICAL DATA:  Neuro deficit, acute, stroke suspected. New focal defects. EXAM: CT HEAD WITHOUT CONTRAST TECHNIQUE: Contiguous axial images were obtained from the base of the skull through the vertex without intravenous contrast. COMPARISON:  Head CT and MRI yesterday. FINDINGS: Brain: Confluent acute infarction in the right posterior temporal and temporoparietal junction regions shows discrete low-density by CT, with minimal petechial blood products but no frank hematoma. Smaller areas cortical and subcortical infarction in the right frontal and frontoparietal cortex and underlying white matter are seen by CT is subtle findings. No hemorrhage  in that region. No new insult is identified. Mild swelling but no significant mass effect or any midline shift. No hydrocephalus. No extra-axial collection. Vascular: There is atherosclerotic calcification of the major vessels at the base of the brain. Skull: Negative Sinuses/Orbits: Clear/normal Other: None IMPRESSION: No apparent change since yesterday. Confluent infarction at the right posterior temporal and temporoparietal junction region with petechial blood products but no frank hematoma. Smaller more subtle infarctions of the cortical and subcortical brain in the right frontal and frontoparietal region appear unchanged. Electronically Signed   By: Nelson Chimes M.D.   On: 10/15/2020 15:16   CT HEAD WO CONTRAST (5MM)  Result Date: 10/14/2020 CLINICAL DATA:  Right-sided headache, recent stroke EXAM: CT HEAD WITHOUT CONTRAST TECHNIQUE: Contiguous axial images were obtained from the base of the skull through the vertex without intravenous contrast. COMPARISON:  10/06/2020 CT head, 10/07/2020 MRI head FINDINGS: Brain: Area of hypodensity in the right parietal and occipital lobes, which correlates with the area of infarct seen on the 10/06/2020 CT and 10/07/2020  MRI, with edema in this area. Subtle areas of hyperdensity, likely petechial hemorrhage. No evidence of hemorrhagic transformation. No new areas of infarction. No significant midline shift. No mass or extra-axial collection. Vascular: No hyperdense vessel or unexpected calcification. Skull: Normal. Negative for fracture or focal lesion. Sinuses/Orbits: Mucosal thickening in the left frontal sinus and bilateral ethmoid air cells. The orbits are unremarkable. Other: None. IMPRESSION: Expected evolution of the previously noted right MCA/PCA territory infarct, with petechial hemorrhage and mild edema, without midline shift or hemorrhagic transformation. Electronically Signed   By: Merilyn Baba MD   On: 10/14/2020 10:02   CT HEAD WO CONTRAST  (5MM)  Result Date: 10/06/2020 CLINICAL DATA:  Neuro deficit, acute, stroke suspected. Steady gait, left-sided weakness and left-sided vision loss, increased blood pressure, headache for 3 days, history of hypertension. EXAM: CT HEAD WITHOUT CONTRAST TECHNIQUE: Contiguous axial images were obtained from the base of the skull through the vertex without intravenous contrast. COMPARISON:  No pertinent prior exams available for comparison. FINDINGS: Brain: Cerebral volume is normal for age. Region of abnormal cortical/subcortical hypodensity measuring 6.3 x 3.3 x 5.8 cm within the right parietooccipital lobes compatible with acute/early subacute infarction (right PCA vascular territory and right MCA/PCA watershed territory). No significant mass effect at this time. No evidence of hemorrhagic conversion. Chronic appearing lacunar infarct within the left lentiform nucleus. No extra-axial fluid collection. No evidence of an intracranial mass. No midline shift. Vascular: No hyperdense vessel.  Atherosclerotic calcifications. Skull: Normal. Negative for fracture or focal lesion. Sinuses/Orbits: Visualized orbits show no acute finding. Mild mucosal thickening within the frontal sinuses bilaterally. Mild-to-moderate mucosal thickening within the bilateral ethmoid air cells. These results were discussed by telephone at the time of interpretation on 10/06/2020 at 1:55 pm with provider MATTHEW TRIFAN , who verbally acknowledged these results. IMPRESSION: 6.3 x 3.3 x 5.8 cm acute/early subacute cortical and subcortical infarct within the right parietooccipital lobes (right PCA vascular territory and right MCA/PCA watershed territory). Consider MR or CT angiography for further evaluation. Chronic left basal ganglia lacunar infarct. Paranasal sinus disease at the imaged levels, as described. Electronically Signed   By: Kellie Simmering DO   On: 10/06/2020 14:13   CT Angio Neck W and/or Wo Contrast  Result Date: 10/06/2020 CLINICAL  DATA:  Stroke. TIA. Assess intracranial arteries. Unsteady gait. Left-sided weakness. Left-sided visual loss. Headache. EXAM: CT ANGIOGRAPHY HEAD AND NECK TECHNIQUE: Multidetector CT imaging of the head and neck was performed using the standard protocol during bolus administration of intravenous contrast. Multiplanar CT image reconstructions and MIPs were obtained to evaluate the vascular anatomy. Carotid stenosis measurements (when applicable) are obtained utilizing NASCET criteria, using the distal internal carotid diameter as the denominator. CONTRAST:  121m OMNIPAQUE IOHEXOL 350 MG/ML SOLN COMPARISON:  Head CT earlier same day FINDINGS: CTA NECK FINDINGS Aortic arch: Aortic atherosclerosis.  Branching pattern is normal. Right carotid system: Common carotid artery is tortuous but widely patent to the bifurcation. Carotid bifurcation is widely patent. Minimal calcified plaque at the distal ICA bulb but no stenosis. Cervical ICA widely patent. Left carotid system: Common carotid artery is tortuous but widely patent to the bifurcation. Carotid bifurcation is normal. Minimal calcified plaque at the ICA bulb but no stenosis. Cervical ICA widely patent beyond that. Vertebral arteries: Right vertebral artery origin is widely patent. Left vertebral artery origin is poorly seen because of regional venous reflux. Both vertebral arteries do appear patent through the cervical region to the foramen magnum. Skeleton: Ordinary cervical spondylosis. Other  neck: No mass or lymphadenopathy. Upper chest: Upper lungs are clear. Review of the MIP images confirms the above findings CTA HEAD FINDINGS Anterior circulation: Both internal carotid arteries are patent through the skull base and siphon regions. There is ordinary siphon atherosclerotic calcification. On the left, there is no stenosis. On the right, there is 50-70% stenosis in the siphon. Supraclinoid internal carotid arteries are widely patent. The anterior and middle  cerebral vessels are patent. No large vessel occlusion, with particular attention to the right MCA. Posterior circulation: Both vertebral arteries are patent through the foramen magnum to the basilar. No basilar stenosis. Posterior circulation branch vessels are normal. Venous sinuses: Patent and normal. Anatomic variants: None significant. Review of the MIP images confirms the above findings IMPRESSION: No intracranial large vessel occlusion identified at this time, with specific attention to the right MCA branches. Minimal atherosclerotic change at both internal carotid artery bulbs. No stenosis. 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 No stenosis on the left. 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 Electronically Signed   By: Nelson Chimes M.D.   On: 10/06/2020 15:01   MR BRAIN WO CONTRAST  Result Date: 10/28/2020 CLINICAL DATA:  Follow-up right brain stroke. EXAM: MRI HEAD WITHOUT CONTRAST TECHNIQUE: Multiplanar, multiecho pulse sequences of the brain and surrounding structures were obtained without intravenous contrast. COMPARISON:  Head CT 10/19/2020.  MRI 10/14/2020. FINDINGS: Brain: The study suffers from considerable motion degradation. No acute or focal finding affects the brainstem or cerebellum. Acute infarction in the right parietal and parieto-occipital brain with involvement of the posterolateral temporal lobe remains evident, consistent with inferior division MCA occlusion. The region of infarction shows increased swelling since the previous examinations. Minimal petechial blood products are present. No frank hematoma. Small foci of acute infarction seen anterior to the large infarction within the right posterior frontal cortical and subcortical brain. Small foci acute infarction of the inferior basal ganglia which are newly seen since the study of 10/14/2020. Swelling and mass effect with right-to-left shift of 14 mm. Threatening uncal herniation on the right. No  hydrocephalus. No extra-axial collection. Old white matter infarctions of the left hemisphere as seen previously. Vascular: Major vessels at the base of the brain show flow. Skull and upper cervical spine: Negative Sinuses/Orbits: Clear/normal Other: None IMPRESSION: Progressive swelling in the right hemisphere infarction affecting the posterolateral temporal lobe, parieto-occipital junction and parietal region. Minimal petechial blood products, not increased since the previous study. No frank hematoma. Small acute infarctions anterior to that within the posterior frontal cortical and subcortical brain. Small acute infarctions at the inferior basal ganglia on the right, newly seen since the study of 10/14/2020. Increased swelling and mass effect with right-to-left shift of 14 mm and threatening uncal herniation. Electronically Signed   By: Nelson Chimes M.D.   On: 10/28/2020 12:41   MR BRAIN WO CONTRAST  Result Date: 10/14/2020 CLINICAL DATA:  Stroke, follow up.  Eval for progression of stroke. EXAM: MRI HEAD WITHOUT CONTRAST TECHNIQUE: Multiplanar, multiecho pulse sequences of the brain and surrounding structures were obtained without intravenous contrast. COMPARISON:  Head CT 10/14/2020 and MRI 10/07/2020 FINDINGS: The study is intermittently motion degraded including severe motion on the coronal T2 sequence. Brain: The moderately large infarct involving the posterior right MCA territory and PCA border zone has mildly expanded compared to the prior MRI, most notable anteriorly and superiorly. Associated petechial hemorrhage is partially confluent and has increased from the prior MRI. There is progressive cytotoxic edema with regional sulcal  effacement and mass effect on the right lateral ventricle. New mildly restricted diffusion in the splenium of the corpus callosum may reflect acute wallerian degeneration. Small cortical and subcortical infarcts in the right frontal lobe (border zone distribution) have also  mildly increased from the prior MRI. Small T2 hyperintensities elsewhere in the cerebral white matter bilaterally are similar to the prior MRI and are nonspecific but compatible with mild chronic small vessel ischemic disease. There is no midline shift or extra-axial fluid collection. A chronic hemorrhagic infarct is again noted in the posterior aspect of the left lentiform nucleus/external capsule. Vascular: Major intracranial vascular flow voids are preserved. Skull and upper cervical spine: Unremarkable bone marrow signal para Sinuses/Orbits: Unremarkable orbits. Mild mucosal thickening in the paranasal sinuses. Clear mastoid air cells. Other: None. IMPRESSION: 1. Mild enlargement of the right MCA/border zone infarct since the 10/07/2020 MRI. Increased cytotoxic edema and petechial hemorrhage without midline shift. 2. Chronic small-vessel ischemia including an old hemorrhagic infarct in the left basal ganglia. Electronically Signed   By: Logan Bores M.D.   On: 10/14/2020 18:22   MR BRAIN WO CONTRAST  Result Date: 10/07/2020 CLINICAL DATA:  Neuro deficit, acute, stroke suspected. Additional history provided: Vision change and mild left-sided weakness which began 2 days ago. EXAM: MRI HEAD WITHOUT CONTRAST TECHNIQUE: Multiplanar, multiecho pulse sequences of the brain and surrounding structures were obtained without intravenous contrast. COMPARISON:  Noncontrast head CT 10/06/2020. CT angiogram head/neck 10/06/2020. FINDINGS: Brain: Cerebral volume is normal. Region of abnormal cortical/subcortical restricted diffusion within the right parietal and occipital lobes measuring 6.2 x 3.3 x 6.4 cm compatible with acute/early subacute infarction (at the junction of the right MCA and PCA vascular territories). This has not significantly changed in extent as compared to the head CT performed yesterday 10/06/2020. Mild T1 hyperintensity and SWI signal loss within the infarction territory compatible with petechial  hemorrhage and possibly cortical laminar necrosis. No significant mass effect at this time. There are numerous additional small patchy cortical and subcortical acute/early subacute infarcts within the right frontal and parietal lobes (watershed distribution). Background mild multifocal T2/FLAIR hyperintensity within the cerebral white matter, nonspecific but compatible with chronic small vessel ischemic disease. Chronic lacunar infarct with associated chronic hemosiderin deposition within the left basal ganglia/posterior limb of left internal capsule. No evidence of an intracranial mass. No extra-axial fluid collection. No midline shift. Vascular: Maintained proximal arterial flow voids. Skull and upper cervical spine: No focal suspicious marrow lesion. Incompletely assessed cervical spondylosis. Sinuses/Orbits: Visualized orbits show no acute finding. 11 mm left maxillary sinus mucous retention cyst. Moderate bilateral ethmoid sinus mucosal thickening. Trace mucosal thickening within the left frontal sinus. IMPRESSION: 6.2 x 6.4 cm acute/early subacute cortical and subcortical infarction within the right parietal and occipital lobes (at the junction of the right MCA and PCA territories). The infarct has not significantly changed in extent as compared to the head CT of 10/06/2020. Mild petechial hemorrhage within the infarction territory. No significant mass effect at this time. Numerous additional small patchy cortical and subcortical acute/early subacute infarcts within the right frontal and parietal lobes (watershed distribution). 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 Background mild chronic small vessel ischemic changes within the cerebral white matter. Paranasal sinus disease at the imaged levels, as described. Electronically Signed   By: Kellie Simmering DO   On: 10/07/2020 07:57   DG Abd Portable 1V  Result Date: 10/22/2020 CLINICAL DATA:   Encounter for NG tube EXAM:  PORTABLE ABDOMEN - 1 VIEW COMPARISON:  None. FINDINGS: There is a feeding tube with tip overlying the distal stomach. There are some prominent loops of small bowel but there is no evidence of obstruction. Multilevel degenerative changes of the spine. IMPRESSION: Feeding tube tip overlies the region of the distal stomach. Electronically Signed   By: Maurine Simmering M.D.   On: 10/22/2020 13:00   DG Swallowing Func-Speech Pathology  Result Date: 10/21/2020 Table formatting from the original result was not included. Objective Swallowing Evaluation: Type of Study: MBS-Modified Barium Swallow Study  Patient Details Name: Clifford [name] MRN: [id number] Date of Birth: 09-Aug-1950 Today's Date: 10/21/2020 Time: SLP Start Time (ACUTE ONLY): 0900 -SLP Stop Time (ACUTE ONLY): 0910 SLP Time Calculation (min) (ACUTE ONLY): 10 min Past Medical History: Past Medical History: Diagnosis Date  BPH (benign prostatic hyperplasia)   Chronic anticoagulation 10/14/2020  Essential hypertension 10/06/2020  History of pulmonary embolism 10/14/2020  HLD (hyperlipidemia)   HTN (hypertension)   Lung nodule   42m in 2015  [place]   Mixed hyperlipidemia 10/06/2020  Nicotine dependence, cigarettes, uncomplicated [id number] Peripheral vascular disease ([facility]   Polyp of colon   Prediabetes 10/14/2020  Pulmonary embolus ([facility]   [place] 10/[place]2020 Past Surgical History: Past Surgical History: Procedure Laterality Date  COLONOSCOPY  03/2017  PARTIAL NEPHRECTOMY Left 02/2018  PROSTATE BIOPSY  2015 HPI: [id number] admitted 10/14/20 with right side headache and LUE weakness. 10/19/20 pt was noted to have increased LUE weakness. 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 CTHead = evolving RMCA infarct with cytotoxic edema, mass effect  Subjective: alert, requires cueing Assessment / Plan / Recommendation CHL IP CLINICAL IMPRESSIONS 10/21/2020  Clinical Impression Pt unfortunately with very limited MBSS. Pt with eyes open during exam, responding some to simple commands but exhibited significant cognitive deficits impairing ability for swallow sequencing. Orally pt with persistent left sided oral motor deficits post CVA that contributed to left anterior spillage, oral holding of bolus, poor bolus cohesion, with minimal to no bolus manipulation. Despite multimodal cues pt was unable to propel any of tested consistencies to pharynx. Contents spilled from oral cavity and remainder was cleaned by SLP with oral care post exam. Attempted nectar thick via tsp and cup, and puree textures. Per chart review, this appears consistent with documented concern from nursing yesterday evening. Recommend NPO with consideration of short term means of alternative nutrition, continued SLP intervention with hopes of PO diet reinitiation as pt clinically tolerating. SLP notified MD and RN of recommendations and will continue to closely follow. SLP Visit Diagnosis Dysphagia, oral phase (R13.11) Attention and concentration deficit following -- Frontal lobe and executive function deficit following -- Impact on safety and function Moderate aspiration risk;Severe aspiration risk;Risk for inadequate nutrition/hydration   CH[facility] IP TREATMENT RECOMMENDATION 10/21/2020 Treatment Recommendations Therapy as outlined in treatment plan below   Prognosis 10/21/2020 Prognosis for Safe Diet Advancement Fair Barriers to Reach Goals Cognitive deficits;Severity of deficits Barriers/Prognosis Comment -- [facility] IP DIET RECOMMENDATION 10/21/2020 SLP Diet Recommendations NPO;Alternative means - temporary Liquid Administration via -- Medication Administration Via alternative means Compensations -- Postural Changes --   CH[facility] IP OTHER RECOMMENDATIONS 10/21/2020 Recommended Consults -- Oral Care Recommendations Oral care QID Other Recommendations --   CH[facility] IP FOLLOW UP RECOMMENDATIONS 10/21/2020 Follow up  Recommendations 24 hour supervision/assistance;Skilled Nursing facility   [facility] D[facility] 10/21/2020 Speech Therapy Frequency (ACUTE  ONLY) min 3x week Treatment Duration 2 weeks      CHL IP ORAL PHASE 10/21/2020 Oral Phase Impaired Oral - Pudding Teaspoon -- Oral - Pudding Cup -- Oral - Honey Teaspoon -- Oral - Honey Cup -- Oral - Nectar Teaspoon Left anterior bolus loss;Weak lingual manipulation;Reduced posterior propulsion;Holding of bolus;Pocketing in anterior sulcus;Lingual/palatal residue;Delayed oral transit;Decreased bolus cohesion Oral - Nectar Cup Left anterior bolus loss;Weak lingual manipulation;Reduced posterior propulsion;Holding of bolus;Left pocketing in lateral sulci;Pocketing in anterior sulcus;Lingual/palatal residue;Delayed oral transit Oral - Nectar Straw -- Oral - Thin Teaspoon -- Oral - Thin Cup -- Oral - Thin Straw -- Oral - Puree Reduced posterior propulsion;Holding of bolus;Left pocketing in lateral sulci;Lingual/palatal residue;Delayed oral transit;Decreased bolus cohesion Oral - Mech Soft -- Oral - Regular -- Oral - Multi-Consistency -- Oral - Pill -- Oral Phase - Comment --  CHL IP PHARYNGEAL PHASE 10/21/2020 Pharyngeal Phase Impaired Pharyngeal- Pudding Teaspoon -- Pharyngeal -- Pharyngeal- Pudding Cup -- Pharyngeal -- Pharyngeal- Honey Teaspoon -- Pharyngeal -- Pharyngeal- Honey Cup -- Pharyngeal -- Pharyngeal- Nectar Teaspoon Other (Comment) Pharyngeal -- Pharyngeal- Nectar Cup Other (Comment) Pharyngeal -- Pharyngeal- Nectar Straw -- Pharyngeal -- Pharyngeal- Thin Teaspoon -- Pharyngeal -- Pharyngeal- Thin Cup -- Pharyngeal -- Pharyngeal- Thin Straw -- Pharyngeal -- Pharyngeal- Puree Other (Comment) Pharyngeal -- Pharyngeal- Mechanical Soft -- Pharyngeal -- Pharyngeal- Regular -- Pharyngeal -- Pharyngeal- Multi-consistency -- Pharyngeal -- Pharyngeal- Pill -- Pharyngeal -- Pharyngeal Comment --  No flowsheet data found. Freeman MA, CCC-SLP 10/21/2020, 9:35 AM               EEG adult  Result Date: 10/15/2020 Lora Havens, MD     10/15/2020 10:31 AM Patient Name: JENCARLOS PLY MRN: [id number] Epilepsy Attending: Lora Havens Referring Physician/Provider: Dr Lesleigh Noe Date: 10/15/2020 Duration: 23.02 mins Patient history: 70 year old male with posterior right parietal watershed infarct who presented with worsening stroke symptoms as well as left thumb rhythmic and nonsuppressible shaking movements.  EEG to evaluate for seizures. Level of alertness: Awake, asleep AEDs during EEG study: LEV Technical aspects: This EEG study was done with scalp electrodes positioned according to the 10-20 International system of electrode placement. Electrical activity was acquired at a sampling rate of '500Hz'$  and reviewed with a high frequency filter of '70Hz'$  and a low frequency filter of '1Hz'$ . EEG data were recorded continuously and digitally stored. Description: The posterior dominant rhythm consists of 8-9 Hz activity of moderate voltage (25-35 uV) seen predominantly in posterior head regions, asymmetric ( R<L) and reactive to eye opening and eye closing. Sleep was characterized by vertex waves, sleep spindles (12 to 14 Hz), maximal frontocentral region.  EEG showed continuous rhythmic 3 to 5 Hz theta-delta slowing in right hemisphere. Hyperventilation and photic stimulation were not performed.   ABNORMALITY - Continuous slow, right hemisphere - Background asymmetry, right<left IMPRESSION: This study is suggestive of cortical dysfunction in right hemisphere, likely secondary to underlying stroke.  No seizures or epileptiform discharges were seen throughout the recording. Priyanka Barbra Sarks   Overnight EEG with video  Result Date: 10/16/2020 Lora Havens, MD     10/16/2020  3:23 PM Patient Name: Clifford [name] MRN: [id number] Epilepsy Attending: Lora Havens Referring Physician/Provider: Dr Rosalin Hawking Duration: 10/15/2020 1639 to 10/16/2020 0230  Patient history: 70 year old male  with posterior right parietal watershed infarct who presented with worsening stroke symptoms as well as left thumb rhythmic and nonsuppressible shaking movements.  EEG to evaluate for seizures.  Level  of alertness: Awake, asleep  AEDs during EEG study: LEV  Technical aspects: This EEG study was done with scalp electrodes positioned according to the 10-20 International system of electrode placement. Electrical activity was acquired at a sampling rate of '500Hz'$  and reviewed with a high frequency filter of '70Hz'$  and a low frequency filter of '1Hz'$ . EEG data were recorded continuously and digitally stored.  Description: The posterior dominant rhythm consists of 8-9 Hz activity of moderate voltage (25-35 uV) seen predominantly in posterior head regions, asymmetric ( R<L) and reactive to eye opening and eye closing. Sleep was characterized by vertex waves, sleep spindles (12 to 14 Hz), maximal frontocentral region.  EEG showed continuous rhythmic 3 to 5 Hz theta-delta slowing in right hemisphere. Hyperventilation and photic stimulation were not performed. EEG was not interpretable after 0230 on 10/16/2020 due to significant electrode artifact.   ABNORMALITY - Continuous slow, right hemisphere - Background asymmetry, right<left  IMPRESSION: This study is suggestive of cortical dysfunction in right hemisphere, likely secondary to underlying stroke.  No seizures or epileptiform discharges were seen throughout the recording.  Lora Havens   ECHOCARDIOGRAM COMPLETE  Result Date: 10/07/2020    ECHOCARDIOGRAM REPORT   Patient Name:   JAIVIN SILERIO Date of Exam: 10/07/2020 Medical Rec #:  [id number]    Height:       65.0 in Accession #:    [id number]   Weight: Date of Birth:  [birth year]     BSA: Patient Age:    36 years     BP:           115/58 mmHg Patient Gender: M            HR:           66 bpm. Exam Location:  Inpatient Procedure: 2D Echo, Cardiac Doppler and Color Doppler Indications:    Stroke I63.9  History:        Patient  has no prior history of Echocardiogram examinations.                 Risk Factors:Hypertension and Dyslipidemia. PVD. PE.  Sonographer:    Vickie Epley RDCS Referring Phys: [place]  1. Left ventricular ejection fraction, by estimation, is 65 to 70%. The left ventricle has hyperdynamic function. The left ventricle has no regional wall motion abnormalities. Left ventricular diastolic parameters are consistent with Grade I diastolic dysfunction (impaired relaxation).  2. Right ventricular systolic function is normal. The right ventricular size is normal. Tricuspid regurgitation signal is inadequate for assessing PA pressure.  3. The mitral valve is normal in structure. No evidence of mitral valve regurgitation. No evidence of mitral stenosis.  4. The aortic valve is tricuspid. Aortic valve regurgitation is not visualized. No aortic stenosis is present.  5. The inferior vena cava is normal in size with greater than 50% respiratory variability, suggesting right atrial pressure of 3 mmHg. FINDINGS  Left Ventricle: Left ventricular ejection fraction, by estimation, is 65 to 70%. The left ventricle has hyperdynamic function. The left ventricle has no regional wall motion abnormalities. The left ventricular internal cavity size was normal in size. There is no left ventricular hypertrophy. Left ventricular diastolic parameters are consistent with Grade I diastolic dysfunction (impaired relaxation). Right Ventricle: The right ventricular size is normal. No increase in right ventricular wall thickness. Right ventricular systolic function is normal. Tricuspid regurgitation signal is inadequate for assessing PA pressure. Left Atrium: Left atrial size was normal in size. Right Atrium:  Right atrial size was normal in size. Pericardium: There is no evidence of pericardial effusion. Mitral Valve: The mitral valve is normal in structure. No evidence of mitral valve regurgitation. No evidence of mitral valve  stenosis. Tricuspid Valve: The tricuspid valve is normal in structure. Tricuspid valve regurgitation is not demonstrated. Aortic Valve: The aortic valve is tricuspid. Aortic valve regurgitation is not visualized. No aortic stenosis is present. Pulmonic Valve: The pulmonic valve was normal in structure. Pulmonic valve regurgitation is not visualized. Aorta: The aortic root is normal in size and structure. Venous: The inferior vena cava is normal in size with greater than 50% respiratory variability, suggesting right atrial pressure of 3 mmHg. IAS/Shunts: No atrial level shunt detected by color flow Doppler.  LEFT VENTRICLE PLAX 2D LVIDd:         3.60 cm      Diastology LVIDs:         2.50 cm      LV e' medial:    6.00 cm/s LV PW:         1.00 cm      LV E/e' medial:  7.4 LV IVS:        1.10 cm      LV e' lateral:   7.62 cm/s LVOT diam:     2.20 cm      LV E/e' lateral: 5.8 LV SV:         97 LVOT Area:     3.80 cm  LV Volumes (MOD) LV vol d, MOD A2C: 98.1 ml LV vol d, MOD A4C: 108.0 ml LV vol s, MOD A2C: 39.6 ml LV vol s, MOD A4C: 40.2 ml LV SV MOD A2C:     58.5 ml LV SV MOD A4C:     108.0 ml LV SV MOD BP:      64.0 ml RIGHT VENTRICLE RV S prime:     14.80 cm/s TAPSE (M-mode): 2.3 cm LEFT ATRIUM           RIGHT ATRIUM LA diam:      3.20 cm RA Area:     14.70 cm LA Vol (A2C): 31.0 ml RA Volume:   36.60 ml LA Vol (A4C): 24.7 ml  AORTIC VALVE LVOT Vmax:   129.00 cm/s LVOT Vmean:  96.600 cm/s LVOT VTI:    0.254 m  AORTA Ao Root diam: 3.50 cm Ao Asc diam:  3.50 cm MITRAL VALVE MV Area (PHT): 2.41 cm    SHUNTS MV Decel Time: 315 msec    Systemic VTI:  0.25 m MV E velocity: 44.50 cm/s  Systemic Diam: 2.20 cm MV A velocity: 65.60 cm/s MV E/A ratio:  0.68 Loralie Champagne MD Electronically signed by Loralie Champagne MD Signature Date/Time: 10/07/2020/5:59:38 PM    Final     Labs:  CBC: Recent Labs    10/29/20 0104 10/30/20 0035 10/31/20 0047 11/01/20 0123  WBC 6.8 6.4 7.6 7.0  HGB 12.4* 11.8* 12.1* 12.4*  HCT 40.2  37.6* 40.2 40.2  PLT 151 139* 144* 149*    COAGS: Recent Labs    10/07/20 0826 10/08/20 0641 10/08/20 1451 10/14/20 0920  INR 2.0* 2.1* 2.0* 1.4*  APTT  --   --   --  82*    BMP: Recent Labs    10/29/20 0104 10/30/20 0035 10/31/20 0047 11/01/20 0123  NA 141 142 144 144  K 4.7 4.2 4.7 4.2  CL 106 110 109 113*  CO2 '26 26 25 23  '$ GLUCOSE 125* 130* 137*  134*  BUN 41* 46* 55* 54*  CALCIUM 9.5 9.2 9.8 9.3  CREATININE 1.28* 1.26* 1.33* 1.22  GFRNONAA 60* >60 57* >60    LIVER FUNCTION TESTS: Recent Labs    10/06/20 1434 10/14/20 0920 10/15/20 0500  BILITOT 0.8 1.0 0.8  AST '26 22 19  '$ ALT '15 13 12  '$ ALKPHOS 81 77 65  PROT 7.8 7.1 6.2*  ALBUMIN 4.3 3.6 3.0*    TUMOR MARKERS: No results for input(s): AFPTM, CEA, CA199, CHROMGRNA in the last 8760 hours.  Assessment and Plan:  CVA Dysphagia Deconditioning Wt loss Need for long term care Scheduled for G tube in IR 8/30 Risks and benefits image guided gastrostomy tube placement was discussed with the patient's family via phone including, but not limited to the need for a barium enema during the procedure, bleeding, infection, peritonitis and/or damage to adjacent structures.  All questions were answered,  family agreeable to proceed.  Consent signed and in chart.    Thank you for this interesting consult.  I greatly enjoyed meeting EASHAN GOEDERT and look forward to participating in their care.  A copy of this report was sent to the requesting provider on this date.  Electronically Signed: Lavonia Drafts, PA-C 11/01/2020, 10:38 AM   I spent a total of 40 Minutes    in face to face in clinical consultation, greater than 50% of which was counseling/coordinating care for percutaneous gastric tube placement

## 2020-11-01 NOTE — Progress Notes (Signed)
   11/01/20  Attempted to contact patient's daughter Adonis Huguenin to follow up on 8/26 goals of care discussion and provide support. Unable to reach. Voicemail left with contact information given.   Noted plan for G tube placement in IR tomorrow on 8/30 with goal of pursing SNF rehab and outpatient palliative care referral. PMT will continue to follow and remains available for acute needs.   Dorthy Cooler, [facility] Palliative Medicine Team  Team Phone # [phone]   NO CHARGE

## 2020-11-01 NOTE — Progress Notes (Signed)
[place] for IV Heparin  Indication: history of pulmonary embolus  Allergies  Allergen Reactions   Apixaban     Other reaction(s): Liver enzymes abnormal   Lisinopril     Other reaction(s): Cough (finding)   Simvastatin     Other reaction(s): Pain in lower limb (finding)    Patient Measurements: Height: '5\' 5"'$  (165.1 cm) Weight: 87.3 kg (192 lb 7.4 oz) IBW/kg (Calculated) : 61.5 Heparin Dosing Weight: 82.5 kg  Vital Signs: Temp: 98.4 F (36.9 C) (08/29 1539) Temp Source: Oral (08/29 1539) BP: 141/81 (08/29 1539) Pulse Rate: 65 (08/29 1539)  Labs: Recent Labs    10/30/20 0035 10/30/20 1021 10/31/20 0047 11/01/20 0123 11/01/20 1639  HGB 11.8*  --  12.1* 12.4*  --   HCT 37.6*  --  40.2 40.2  --   PLT 139*  --  144* 149*  --   HEPARINUNFRC 0.48   < > 0.45 0.62 0.62  CREATININE 1.26*  --  1.33* 1.22  --    < > = values in this interval not displayed.    Estimated Creatinine Clearance: 56.4 mL/min (by C-G formula based on SCr of 1.22 mg/dL).  Assessment: 70 y.o. male with stroke complicated by petechial hemorrhage with hx PE, Pradaxa on hold, now on IV heparin. Pt was on Lovenox 1 mg/kg SQ q12h from 8/19-8/25, last dose 8/25 at 0500.    Heparin level increased 8/27 900 > 950 units/hr. Heparin levels above goal range @ 0.67. Will decrease slightly back to 900 units/hr to target goal range of 0.3-0.5. H/H stable at 12.4/40.2, plt 149. Per RN, no issues with IV or bleeding observed. No changes needed at this time.  8/29 pm: heparin level remains above goal at 0.62 on 900 units/hr despite rate decrease. No problems with infusion or bleeding per RN.   Goal of Therapy:  Heparin level 0.3-0.5 units/ml Monitor platelets by anticoagulation protocol: Yes   Plan:  - Decrease heparin drip to 800 units/hr - 8h heparin level - Monitor daily heparin level, and CBC - Monitor for bleeding - F/U plan for oral anticoagulant after PEG placement  next week  Thank you for involving pharmacy in this patient's care.  Renold Genta, PharmD, BCPS Clinical Pharmacist Clinical phone for 11/01/2020 until 10p is [phone] 11/01/2020 6:10 PM  **Pharmacist phone directory can be found on [email or web address] listed under [place]**

## 2020-11-01 NOTE — Progress Notes (Signed)
[facility]   INTERIM SUMMARY      Clifford [name]  [id number] DOB: 1950/05/27 DOA: 10/14/2020 PCP: Pcp, No      Brief Narrative:   [name] is a 70 y.o. 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    Had an initial stroke 8/3, was admitted 2 days, then discharged with [facility].  After about 4 days at home, he developed worsening of his stroke symptoms, returned to the hospital.  In the ER, found to have enlargement of his previous stroke with micro-hemorrhage.  Initially had relatively mild deficits still, NIHSS 4, so his Pradaxa was held and he was admitted for SNF placement for short term rehab.    Here, however, he developed progressively worsening left hemiparesis, then somnolence (see NIHSS chart in progress note 8/24).  Neurology were reconsulted.  Serial head CTs on 8/16 showed expected evolution/progression of his stroke, further edema, but no herniation or substantial midline shift.  Neurology followed the patient and recommended further supportive care and SNF placement.  An NG tube was placed as the patient lost the ability to swallow.  Palliative Care were consulted and in detailed family discussions, with prognosis provided by Neurology (more likely able to regain ability to talk and participate in self cares, unlikely to regain ability to walk, uncertain if able to regain ability to swallow, very unlikely to improve to full independence), it was decided to place PEG and pursue SNF rehab     Assessment & Plan:    Increased petechial hemorrhage/evolution of previous right MCA/PCA territory infarct with mild edema with now increasing midline shift on repeat MRI 10/28/2020 - MRI mild enlargement with cytotoxic edema and petechial hemorrhage with increased midline shift compared to 10/07/20. - Neurology  suspect his current poor functional status is mostly due to swelling, with a component of this from free water and a component of this from bleeding.  They feel this is within the range of expected, and that it is likely to improve, with prognosis outlined above.   -He remains with significant dysphagia, . - PEG tube scheduled for tomorrow by IR -Continue to hold free water -Patient still complaining of headache, he will be started on Topamax as discussed with neurology Dr. Leonie Man -Repeat CT head this morning showing progressive CVA, midline shift is 10 mm(as opposed to 14 mm on MRI 8/25)  In light of this potential for recovery, Neurology recommend: - Continue atorvastatin - Hold free water to minimize cerebral edema, if Na or Cr rise, would use saline , sodium is 144 today, no need for any fluids yets. - Continue supportive care and serial neurological exams   Hypertension - Continue amlodipine, remains elevated, will add hydralazine.  Suspected focal seizures Presented with left hand twitching which was nonsuppressible and clinically suspicious for stroke, and was started on Keppra.    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  Last was >6 months ago, family uncertain when.  Had been on long term  warfarin prior to Aug 3 stroke, discharged from that initial hospitalization on Pradaxa, held at admission.  Started on HD4, then transitioned to Lovenox when PEG placed.   - Continue heparin gtt - Plan for Eliquis after PEG placement   Lung nodule Incidental finding of 23m LUL nodule.  Has not been discussed with family. Patient former smoker. -  Recommend follow up CT in 6-12 months depending on neurological progressio   Possible sclerotic vertebra on imaging Incidental finding.  PSA 5, unlikely this sclerosis is from metastatic prostate cancer with that level.  Chronic diastolic CHF  Appears euvolemic.  Not on diuretic at baseline  Hyperkalemia Resolved with [facility] Right eye pain -With some scleral injection, discussed with ophthalmology, likely due to abrasion from stroke and it remains open, initially some improvement with erythromycin ointment, but appears to be in full again today, await official ophthalmology consultations.      Disposition: Status is: Inpatient  Remains inpatient appropriate because:IV treatments appropriate due to intensity of illness or inability to take PO  Dispo: The patient is from: Home              Anticipated d/c is to:  TBD              Patient currently is not medically stable to d/c.   Difficult to place patient No   Patient admitted with worsening stroke symptoms.  Has had progressively worsening mentation and symptoms since hospital day 3, likely due to cerebral edema, due to evolution and microhemorrhage of his initial infarct.    Will continue supportive cares, with expectation that his swelling will subside int he coming week and he will be able to engage in therapy, have PEG placed, and transition to short term rehab to regain the ability to speak and swallow.           Level of care: Telemetry Medical       MDM: The below labs and imaging reports were reviewed and summarized above.  Medication management as above.    DVT prophylaxis: Heparin GTT  Code Status: FULL Family Communication: None at bedside today.     Subjective:  Patient complains of headache today.  Objective: Vitals:   11/01/20 0405 11/01/20 0455 11/01/20 0729 11/01/20 1132  BP:  (!) 155/68 (!) 159/70 (!) 184/87  Pulse:  62 76 74  Resp:  '20 18 17  '$ Temp:  98.8 F (37.1 C) 99 F  (37.2 C) 98.8 F (37.1 C)  TempSrc:  Oral Axillary Oral  SpO2:  100% 96% 97%  Weight: 87.3 kg     Height:        Intake/Output Summary (Last 24 hours) at 11/01/2020 1357 Last data filed at 11/01/2020 0500 Gross per 24 hour  Intake 640 ml  Output 1700 ml  Net -1060 ml    Filed Weights   10/28/20 0454 10/29/20 0403 11/01/20 0405  Weight: 87.4 kg 87.5 kg 87.3 kg   Examination:  Awake Alert, remains with dense left-sided weakness  symmetrical Chest wall movement, Good air movement bilaterally, CTAB RRR,No Gallops,Rubs or new Murmurs, No Parasternal Heave +ve B.Sounds, Abd Soft, No tenderness, No rebound - guarding or rigidity. No Cyanosis, Clubbing or edema, No new Rash or bruise     Data Reviewed: I have personally reviewed following labs and imaging studies:  CBC: Recent Labs  Lab 10/28/20 1121 10/29/20 0104 10/30/20 0035 10/31/20 0047 11/01/20 0123  WBC 4.9 6.8 6.4 7.6 7.0  HGB 11.5* 12.4* 11.8*  12.1* 12.4*  HCT 36.9* 40.2 37.6* 40.2 40.2  MCV 82.2 82.9 83.4 84.6 83.8  PLT 130* 151 139* 144* [id number]*   Basic Metabolic Panel: Recent Labs  Lab 10/28/20 1121 10/29/20 0104 10/30/20 0035 10/31/20 0047 11/01/20 0123  NA 141 141 142 144 144  K 4.3 4.7 4.2 4.7 4.2  CL 107 106 110 109 113*  CO2 '26 26 26 25 23  '$ GLUCOSE 114* 125* 130* 137* 134*  BUN 41* 41* 46* 55* 54*  CREATININE 1.31* 1.28* 1.26* 1.33* 1.22  CALCIUM 9.1 9.5 9.2 9.8 9.3   GFR: Estimated Creatinine Clearance: 56.4 mL/min (by C-G formula based on SCr of 1.22 mg/dL). Liver Function Tests: No results for input(s): AST, ALT, ALKPHOS, BILITOT, PROT, ALBUMIN in the last 168 hours. No results for input(s): LIPASE, AMYLASE in the last 168 hours. No results for input(s): AMMONIA in the last 168 hours. Coagulation Profile: No results for input(s): INR, PROTIME in the last 168 hours. Cardiac Enzymes: No results for input(s): CKTOTAL, CKMB, CKMBINDEX, TROPONINI in the last 168 hours. BNP (last 3  results) No results for input(s): PROBNP in the last 8760 hours. HbA1C: No results for input(s): HGBA1C in the last 72 hours. CBG: Recent Labs  Lab 10/31/20 1952 10/31/20 2301 11/01/20 0357 11/01/20 0744 11/01/20 1130  GLUCAP 103* 127* 127* 127* 117*   Lipid Profile: No results for input(s): CHOL, HDL, LDLCALC, TRIG, CHOLHDL, LDLDIRECT in the last 72 hours. Thyroid Function Tests: No results for input(s): TSH, T4TOTAL, FREET4, T3FREE, THYROIDAB in the last 72 hours. Anemia Panel: No results for input(s): VITAMINB12, FOLATE, FERRITIN, TIBC, IRON, RETICCTPCT in the last 72 hours. Urine analysis:    Component Value Date/Time   COLORURINE YELLOW 10/06/2020 [place] 10/06/2020 1353   LABSPEC 1.020 10/06/2020 1353   PHURINE 6.0 10/06/2020 1353   GLUCOSEU NEGATIVE 10/06/2020 1353   HGBUR TRACE (A) 10/06/2020 1353   BILIRUBINUR NEGATIVE 10/06/2020 1353   KETONESUR NEGATIVE 10/06/2020 1353   PROTEINUR NEGATIVE 10/06/2020 1353   NITRITE NEGATIVE 10/06/2020 1353   LEUKOCYTESUR SMALL (A) 10/06/2020 1353   Sepsis Labs: '@LABRCNTIP'$ (procalcitonin:4,lacticacidven:4)  )No results found for this or any previous visit (from the past 240 hour(s)).       Radiology Studies: CT HEAD WO CONTRAST (5MM)  Result Date: 11/01/2020 CLINICAL DATA:  Follow-up stroke. EXAM: CT HEAD WITHOUT CONTRAST TECHNIQUE: Contiguous axial images were obtained from the base of the skull through the vertex without intravenous contrast. COMPARISON:  October 28, 2020.  October 19, 2020. FINDINGS: Brain: Large low density area is seen involving the posterior portion of the right middle cerebral arterial territory consistent with evolving subacute infarction. There is approximately 10 mm of right to left midline shift which is slightly enlarged compared to prior exam. Ventricular size is within normal limits. No definite evidence of acute hemorrhage is noted. Vascular: No hyperdense vessel or unexpected  calcification. Skull: Normal. Negative for fracture or focal lesion. Sinuses/Orbits: No acute finding. Other: None. IMPRESSION: Continued evolving subacute infarction seen involving the posterior portion of the right middle cerebral arterial territory. There is approximately 10 mm of right to left midline shift which may be slightly enlarged compared to prior exam. No ventricular dilatation is noted. Electronically Signed   By: Marijo Conception M.D.   On: 11/01/2020 12:29        Scheduled Meds:  amLODipine  10 mg Per Tube Daily   atorvastatin  40 mg Per Tube Daily   erythromycin  Right Eye TID   feeding supplement (PROSource TF)  45 mL Per Tube BID   fiber  1 packet Per Tube Daily   insulin aspart  0-9 Units Subcutaneous Q4H   levETIRAcetam  500 mg Per Tube BID   mouth rinse  15 mL Mouth Rinse BID   pantoprazole sodium  40 mg Per Tube Daily   Continuous Infusions:  [START ON 11/02/2020]  ceFAZolin (ANCEF) IV     feeding supplement (OSMOLITE 1.5 CAL) 55 mL/hr at 11/01/20 0500   heparin 900 Units/hr (11/01/20 0929)     LOS: 60 days       Phillips Climes, MD [facility] 11/01/2020, 1:57 PM     Please page though Shea Evans or Epic secure chat:  For [employer], Adult nurse

## 2020-11-02 ENCOUNTER — Inpatient Hospital Stay (HOSPITAL_COMMUNITY): Payer: No Typology Code available for payment source

## 2020-11-02 DIAGNOSIS — R1310 Dysphagia, unspecified: Secondary | ICD-10-CM | POA: Diagnosis not present

## 2020-11-02 DIAGNOSIS — R531 Weakness: Secondary | ICD-10-CM | POA: Diagnosis not present

## 2020-11-02 HISTORY — PX: IR GASTROSTOMY TUBE MOD SED: IMG625

## 2020-11-02 LAB — SURGICAL PCR SCREEN
MRSA, PCR: NEGATIVE
Staphylococcus aureus: NEGATIVE

## 2020-11-02 LAB — CBC
HCT: 41.8 % (ref 39.0–52.0)
Hemoglobin: 13.1 g/dL (ref 13.0–17.0)
MCH: 25.8 pg — ABNORMAL LOW (ref 26.0–34.0)
MCHC: 31.3 g/dL (ref 30.0–36.0)
MCV: 82.4 fL (ref 80.0–100.0)
Platelets: 154 10*3/uL (ref 150–400)
RBC: 5.07 MIL/uL (ref 4.22–5.81)
RDW: 15.1 % (ref 11.5–15.5)
WBC: 11.7 10*3/uL — ABNORMAL HIGH (ref 4.0–10.5)
nRBC: 0 % (ref 0.0–0.2)

## 2020-11-02 LAB — HEPARIN LEVEL (UNFRACTIONATED): Heparin Unfractionated: 0.48 IU/mL (ref 0.30–0.70)

## 2020-11-02 LAB — PROTIME-INR
INR: 1.2 (ref 0.8–1.2)
Prothrombin Time: 14.9 seconds (ref 11.4–15.2)

## 2020-11-02 LAB — BASIC METABOLIC PANEL
Anion gap: 8 (ref 5–15)
BUN: 56 mg/dL — ABNORMAL HIGH (ref 8–23)
CO2: 24 mmol/L (ref 22–32)
Calcium: 9.8 mg/dL (ref 8.9–10.3)
Chloride: 114 mmol/L — ABNORMAL HIGH (ref 98–111)
Creatinine, Ser: 1.32 mg/dL — ABNORMAL HIGH (ref 0.61–1.24)
GFR, Estimated: 58 mL/min — ABNORMAL LOW (ref >60)
Glucose, Bld: 116 mg/dL — ABNORMAL HIGH (ref 70–99)
Potassium: 4.5 mmol/L (ref 3.5–5.1)
Sodium: 146 mmol/L — ABNORMAL HIGH (ref 135–145)

## 2020-11-02 LAB — GLUCOSE, CAPILLARY
Glucose-Capillary: 140 mg/dL — ABNORMAL HIGH (ref 70–99)
Glucose-Capillary: 106 mg/dL — ABNORMAL HIGH (ref 70–99)
Glucose-Capillary: 115 mg/dL — ABNORMAL HIGH (ref 70–99)
Glucose-Capillary: 120 mg/dL — ABNORMAL HIGH (ref 70–99)
Glucose-Capillary: 110 mg/dL — ABNORMAL HIGH (ref 70–99)
Glucose-Capillary: 122 mg/dL — ABNORMAL HIGH (ref 70–99)
Glucose-Capillary: 109 mg/dL — ABNORMAL HIGH (ref 70–99)

## 2020-11-02 MED ORDER — MIDAZOLAM HCL 2 MG/2ML IJ SOLN
INTRAMUSCULAR | Status: AC | PRN
  Administered 2020-11-02: 1 mg via INTRAVENOUS

## 2020-11-02 MED ORDER — LIDOCAINE HCL (PF) 1 % IJ SOLN
INTRAMUSCULAR | Status: AC | PRN
Start: 2020-11-02 — End: 2020-11-02
  Administered 2020-11-02: 5 mL

## 2020-11-02 MED ORDER — FENTANYL CITRATE (PF) 100 MCG/2ML IJ SOLN
INTRAMUSCULAR | Status: AC | PRN
  Administered 2020-11-02: 50 ug via INTRAVENOUS

## 2020-11-02 MED ORDER — LIDOCAINE HCL 1 % IJ SOLN
INTRAMUSCULAR | Status: AC
  Filled 2020-11-02: qty 20

## 2020-11-02 MED ORDER — GLUCAGON HCL RDNA (DIAGNOSTIC) 1 MG IJ SOLR
INTRAMUSCULAR | Status: AC | PRN
  Administered 2020-11-02: 1 mg via INTRAVENOUS

## 2020-11-02 MED ORDER — GLUCAGON HCL RDNA (DIAGNOSTIC) 1 MG IJ SOLR
INTRAMUSCULAR | Status: AC
  Filled 2020-11-02: qty 1

## 2020-11-02 MED ORDER — LIDOCAINE VISCOUS HCL 2 % MT SOLN
OROMUCOSAL | Status: AC
  Filled 2020-11-02: qty 15

## 2020-11-02 MED ORDER — MIDAZOLAM HCL 2 MG/2ML IJ SOLN
INTRAMUSCULAR | Status: AC
  Filled 2020-11-02: qty 2

## 2020-11-02 MED ORDER — SODIUM CHLORIDE 0.9 % IV SOLN: INTRAVENOUS | Status: DC

## 2020-11-02 MED ORDER — FENTANYL CITRATE (PF) 100 MCG/2ML IJ SOLN
INTRAMUSCULAR | Status: AC
  Filled 2020-11-02: qty 2

## 2020-11-02 MED ORDER — IOHEXOL 240 MG/ML SOLN
50.0000 mL | Freq: Once | INTRAMUSCULAR | Status: AC | PRN
  Administered 2020-11-02: 15 mL

## 2020-11-02 MED ORDER — APIXABAN 5 MG PO TABS
5.0000 mg | ORAL_TABLET | Freq: Two times a day (BID) | ORAL | Status: DC
  Administered 2020-11-02 – 2020-11-30 (×56): 5 mg
  Filled 2020-11-02 (×56): qty 1

## 2020-11-02 NOTE — Progress Notes (Signed)
[facility]   INTERIM SUMMARY      AARON MALKIN  [id number] DOB: 06/19/1950 DOA: 10/14/2020 PCP: Pcp, No      Brief Narrative:   [name] is a 70 y.o. 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    Had an initial stroke 8/3, was admitted 2 days, then discharged with [facility].  After about 4 days at home, he developed worsening of his stroke symptoms, returned to the hospital.  In the ER, found to have enlargement of his previous stroke with micro-hemorrhage.  Initially had relatively mild deficits still, NIHSS 4, so his Pradaxa was held and he was admitted for SNF placement for short term rehab.    Here, however, he developed progressively worsening left hemiparesis, then somnolence (see NIHSS chart in progress note 8/24).  Neurology were reconsulted.  Serial head CTs on 8/16 showed expected evolution/progression of his stroke, further edema, but no herniation or substantial midline shift.  Neurology followed the patient and recommended further supportive care and SNF placement.  An NG tube was placed as the patient lost the ability to swallow.  Palliative Care were consulted and in detailed family discussions, with prognosis provided by Neurology (more likely able to regain ability to talk and participate in self cares, unlikely to regain ability to walk, uncertain if able to regain ability to swallow, very unlikely to improve to full independence), it was decided to place PEG and pursue SNF rehab, PEG was placed 8/30.     Assessment & Plan:    Increased petechial hemorrhage/evolution of previous right MCA/PCA territory infarct with mild edema with now increasing midline shift on repeat MRI 10/28/2020 - MRI mild enlargement with cytotoxic edema and petechial hemorrhage with increased midline shift compared to  10/07/20. - Neurology suspect his current poor functional status is mostly due to swelling, with a component of this from free water and a component of this from bleeding.  They feel this is within the range of expected, and that it is likely to improve, with prognosis outlined above.   -He remains with significant dysphagia, placed by IR 8/30. -Continue to hold free water -Patient still complaining of headache, he will be started on Topamax as discussed with neurology Dr. Leonie Man -Repeat CT head this morning showing progressive CVA, midline shift is 10 mm(as opposed to 14 mm on MRI 8/25)  In light of this potential for recovery, Neurology recommend: - Continue atorvastatin - Hold free water to minimize cerebral edema, if Na or Cr rise, would use saline , sodium is 1 46 today, he started on NS, this can be discontinued tomorrow once he is started on tube feed.     Hypertension - Continue amlodipine, remains elevated, will add hydralazine.  Suspected focal seizures Presented with left hand twitching which was nonsuppressible and clinically suspicious for stroke, and was started on Keppra.    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  Last was >6 months ago, family uncertain when.  Had  been on long term warfarin prior to Aug 3 stroke, discharged from that initial hospitalization on Pradaxa, held at admission.  Started on HD4, then transitioned to Lovenox when PEG placed.   - Initially on heparin GTT, will start on Eliquis this evening now PEG is placed.   Lung nodule Incidental finding of 71m LUL nodule.  Has not been discussed with family. Patient former  smoker. - Recommend follow up CT in 6-12 months depending on neurological progressio   Possible sclerotic vertebra on imaging Incidental finding.  PSA 5, unlikely this sclerosis is from metastatic prostate cancer with that level.  Chronic diastolic CHF  Appears euvolemic.  Not on diuretic at baseline  Hyperkalemia Resolved with Lokelma  Right eye pain -With some scleral injection, discussed with ophthalmology dr PPosey Pronto, likely due to abrasion from stroke and it remains open, initially some improvement with erythromycin ointment, but appears to be in full again today, await official ophthalmology consultations.      Disposition: Status is: Inpatient  Remains inpatient appropriate because:IV treatments appropriate due to intensity of illness or inability to take PO  Dispo: The patient is from: Home              Anticipated d/c is to:  SNF              Patient currently is not medically stable to d/c.   Difficult to place patient No   Patient admitted with worsening stroke symptoms.  Has had progressively worsening mentation and symptoms since hospital day 3, likely due to cerebral edema, due to evolution and microhemorrhage of his initial infarct.    Will continue supportive cares, with expectation that his swelling will subside int he coming week and he will be able to engage in therapy, have PEG placed, and transition to short term rehab to regain the ability to speak and swallow.           Level of care: Telemetry Medical       MDM: The below labs and imaging reports were reviewed and summarized above.  Medication management as above.    DVT prophylaxis: Heparin GTT apixaban (ELIQUIS) tablet 5 mg  Code Status: FULL Family Communication: Discussed with brother at bedside       Subjective:  He denies any headache, denies any eye pain today.  Objective: Vitals:   11/02/20 1050 11/02/20 1100 11/02/20 1449 11/02/20 1646  BP: (!) 163/96 (!) 171/91 (!)  158/93 (!) 141/97  Pulse: 100 81 93 96  Resp: '16 12 15 20  '$ Temp:   98.2 F (36.8 C) 98.8 F (37.1 C)  TempSrc:   Oral Oral  SpO2: 100% 99% 99% 98%  Weight:      Height:       No intake or output data in the 24 hours ending 11/02/20 1650   Filed Weights   10/28/20 0454 10/29/20 0403 11/01/20 0405  Weight: 87.4 kg 87.5 kg 87.3 kg   Examination:  Awake Alert, appears to be little lethargic as seen and examined when he came back after PICC placement  symmetrical Chest wall movement, Good air movement bilaterally, CTAB RRR,No Gallops,Rubs or new Murmurs, No Parasternal Heave +ve B.Sounds, Abd Soft, No tenderness, PEG present, insertion site bandaged No Cyanosis, Clubbing or edema, No new Rash or bruise      Data Reviewed: I have personally reviewed following labs and imaging studies:  CBC: Recent Labs  Lab 10/29/20 0104 10/30/20 0035 10/31/20 0047 11/01/20 0123 11/02/20 0157  WBC 6.8 6.4 7.6 7.0 11.7*  HGB 12.4* 11.8* 12.1* 12.4* 13.1  HCT 40.2 37.6* 40.2 40.2 41.8  MCV 82.9 83.4 84.6 83.8 82.4  PLT 151 139* 144* 149* [id number]   Basic Metabolic Panel: Recent Labs  Lab 10/29/20 0104 10/30/20 0035 10/31/20 0047 11/01/20 0123 11/02/20 0157  NA 141 142 144 144 146*  K 4.7 4.2 4.7 4.2 4.5  CL 106 110 109 113* 114*  CO2 '26 26 25 23 24  '$ GLUCOSE 125* 130* 137* 134* 116*  BUN 41* 46* 55* 54* 56*  CREATININE 1.28* 1.26* 1.33* 1.22 1.32*  CALCIUM 9.5 9.2 9.8 9.3 9.8   GFR: Estimated Creatinine Clearance: 52.1 mL/min (A) (by C-G formula based on SCr of 1.32 mg/dL (H)). Liver Function Tests: No results for input(s): AST, ALT, ALKPHOS, BILITOT, PROT, ALBUMIN in the last 168 hours. No results for input(s): LIPASE, AMYLASE in the last 168 hours. No results for input(s): AMMONIA in the last 168 hours. Coagulation Profile: Recent Labs  Lab 11/02/20 0157  INR 1.2   Cardiac Enzymes: No results for input(s): CKTOTAL, CKMB, CKMBINDEX, TROPONINI in the last 168 hours. BNP  (last 3 results) No results for input(s): PROBNP in the last 8760 hours. HbA1C: No results for input(s): HGBA1C in the last 72 hours. CBG: Recent Labs  Lab 11/01/20 2001 11/02/20 0109 11/02/20 0424 11/02/20 0814 11/02/20 1207  GLUCAP 157* 140* 106* 115* 120*   Lipid Profile: No results for input(s): CHOL, HDL, LDLCALC, TRIG, CHOLHDL, LDLDIRECT in the last 72 hours. Thyroid Function Tests: No results for input(s): TSH, T4TOTAL, FREET4, T3FREE, THYROIDAB in the last 72 hours. Anemia Panel: No results for input(s): VITAMINB12, FOLATE, FERRITIN, TIBC, IRON, RETICCTPCT in the last 72 hours. Urine analysis:    Component Value Date/Time   COLORURINE YELLOW 10/06/2020 [place] 10/06/2020 1353   LABSPEC 1.020 10/06/2020 1353   PHURINE 6.0 10/06/2020 1353   GLUCOSEU NEGATIVE 10/06/2020 1353   HGBUR TRACE (A) 10/06/2020 1353   BILIRUBINUR NEGATIVE 10/06/2020 1353   KETONESUR NEGATIVE 10/06/2020 1353   PROTEINUR NEGATIVE 10/06/2020 1353   NITRITE NEGATIVE 10/06/2020 1353   LEUKOCYTESUR SMALL (A) 10/06/2020 1353   Sepsis Labs: '@LABRCNTIP'$ (procalcitonin:4,lacticacidven:4)  ) Recent Results (from the past 240 hour(s))  Surgical pcr screen     Status: None   Collection Time: 11/02/20  1:30 AM   Specimen: Nasal Mucosa; Nasal Swab  Result Value Ref Range Status   MRSA, PCR NEGATIVE NEGATIVE Final   Staphylococcus aureus NEGATIVE NEGATIVE Final    Comment: (NOTE) The Xpert SA Assay (FDA approved for NASAL specimens in patients 33 years of age and older), is one component of a comprehensive surveillance program. It is not intended to diagnose infection nor to guide or monitor treatment. Performed at [facility], [place] [address] [place], [place] [address]          Radiology Studies: CT HEAD WO CONTRAST (5MM)  Result Date: 11/01/2020 CLINICAL DATA:  Follow-up stroke. EXAM: CT HEAD WITHOUT CONTRAST TECHNIQUE: Contiguous axial images were obtained from  the base of the skull through the vertex without intravenous contrast. COMPARISON:  October 28, 2020.  October 19, 2020. FINDINGS: Brain: Large low density area is seen involving the posterior portion of the right middle cerebral arterial territory consistent with evolving subacute infarction. There is approximately 10 mm of right to left midline shift which is slightly enlarged compared to prior exam. Ventricular size is within normal limits. No definite evidence of acute hemorrhage  is noted. Vascular: No hyperdense vessel or unexpected calcification. Skull: Normal. Negative for fracture or focal lesion. Sinuses/Orbits: No acute finding. Other: None. IMPRESSION: Continued evolving subacute infarction seen involving the posterior portion of the right middle cerebral arterial territory. There is approximately 10 mm of right to left midline shift which may be slightly enlarged compared to prior exam. No ventricular dilatation is noted. Electronically Signed   By: Marijo Conception M.D.   On: 11/01/2020 12:29   IR GASTROSTOMY TUBE MOD SED  Result Date: 11/02/2020 INDICATION: CVA Dysphagia EXAM: Fluoroscopy guided G-tube placement MEDICATIONS: Ancef 2 g IV; Antibiotics were administered within 1 hour of the procedure. Glucagon 1 mg IV ANESTHESIA/SEDATION: Versed 1 mg IV; Fentanyl 50 mcg IV Moderate Sedation Time:  19 minutes The patient was continuously monitored during the procedure by the interventional radiology nurse under my direct supervision. CONTRAST:  15 mL of Omnipaque 240-administered into the gastric lumen. FLUOROSCOPY TIME:  Fluoroscopy Time: 10 minutes 0 seconds (83 mGy). COMPLICATIONS: None immediate. PROCEDURE: Informed written consent was obtained from the patient's family after a thorough discussion of the procedural risks, benefits and alternatives. All questions were addressed. Maximal Sterile Barrier Technique was utilized including caps, mask, sterile gowns, sterile gloves, sterile drape, hand  hygiene and skin antiseptic. A timeout was performed prior to the initiation of the procedure. Oral contrast was administered through NG tube over night prior to the procedure in order to opacify the colon. An orogastric tube was placed with fluoroscopic guidance. The anterior abdomen was prepped and draped in sterile fashion. Ultrasound evaluation of the left upper quadrant was performed to confirm the position of the liver. The skin and subcutaneous tissues were anesthetized with 1% lidocaine. Single gastropexy was placed. 17 gauge needle was directed into the distended stomach with fluoroscopic guidance. A wire was advanced into the stomach. 9-French vascular sheath was placed and the orogastric tube was snared using a Gooseneck snare device. The orogastric tube and snare were pulled out of the patient's mouth. The snare device was connected to a 20-French gastrostomy tube. The snare device and gastrostomy tube were pulled through the patient's mouth and out the anterior abdominal wall. The gastrostomy tube was cut to an appropriate length. The gastropexy was cut. Contrast injection through gastrostomy tube confirmed placement within the stomach. Fluoroscopic images were obtained for documentation. The gastrostomy tube was flushed with normal saline. IMPRESSION: 63 French pull-through gastrostomy tube placed using fluoroscopic guidance. Electronically Signed   By: Miachel Roux M.D.   On: 11/02/2020 13:13   DG Abd Portable 1V  Result Date: 11/02/2020 CLINICAL DATA:  Evaluate contrast in bowel before G-tube EXAM: PORTABLE ABDOMEN - 1 VIEW COMPARISON:  None. FINDINGS: There is an enteric tube with tip terminating in the gastric antrum. Contrast material is seen in the large bowel. No residual contrast material in the small bowel was identified. Normal bowel-gas pattern. No abnormal calcifications. The osseous structures are unremarkable. IMPRESSION: Enteric tube with tip in the gastric antrum. Normal bowel gas  pattern with contrast material limited to the large bowel. Electronically Signed   By: Albin Felling M.D.   On: 11/02/2020 08:29        Scheduled Meds:  amLODipine  10 mg Per Tube Daily   apixaban  5 mg Per Tube BID   atorvastatin  40 mg Per Tube Daily   erythromycin   Right Eye TID   feeding supplement (PROSource TF)  45 mL Per Tube BID   fiber  1  packet Per Tube Daily   hydrALAZINE  25 mg Per Tube QID   insulin aspart  0-9 Units Subcutaneous Q4H   levETIRAcetam  500 mg Per Tube BID   lidocaine       lidocaine       mouth rinse  15 mL Mouth Rinse BID   pantoprazole sodium  40 mg Per Tube Daily   topiramate  50 mg Per Tube BID   Continuous Infusions:  sodium chloride 50 mL/hr at 11/02/20 0815   feeding supplement (OSMOLITE 1.5 CAL) 55 mL/hr at 11/01/20 0500     LOS: 68 days       Phillips Climes, MD [facility] 11/02/2020, 4:50 PM     Please page though [place] or Epic secure chat:  For [employer], Adult nurse

## 2020-11-02 NOTE — Progress Notes (Signed)
[place] for IV Heparin >> Apixaban Indication: history of pulmonary embolus  Allergies  Allergen Reactions   Apixaban     Other reaction(s): Liver enzymes abnormal   Lisinopril     Other reaction(s): Cough (finding)   Simvastatin     Other reaction(s): Pain in lower limb (finding)    Patient Measurements: Height: '5\' 5"'$  (165.1 cm) Weight: 87.3 kg (192 lb 7.4 oz) IBW/kg (Calculated) : 61.5 Heparin Dosing Weight: 82.5 kg  Vital Signs: Temp: 98.5 F (36.9 C) (08/30 0752) Temp Source: Oral (08/30 0752) BP: 159/79 (08/30 0752) Pulse Rate: 68 (08/30 0752)  Labs: Recent Labs    10/31/20 0047 11/01/20 0123 11/01/20 1639 11/02/20 0157  HGB 12.1* 12.4*  --  13.1  HCT 40.2 40.2  --  41.8  PLT 144* 149*  --  154  LABPROT  --   --   --  14.9  INR  --   --   --  1.2  HEPARINUNFRC 0.45 0.62 0.62 0.48  CREATININE 1.33* 1.22  --  1.32*    Estimated Creatinine Clearance: 52.1 mL/min (A) (by C-G formula based on SCr of 1.32 mg/dL (H)).  Assessment: 70 y.o. male with stroke complicated by petechial hemorrhage with hx PE, Pradaxa on hold, now on IV heparin. Pt was on Lovenox 1 mg/kg SQ q12h from 8/19-8/25, last dose 8/25 at 0500.    Heparin held this AM for planned PEG placement - now completed. Post-op, anticoagulation plans discussed with the MD and decision was made to re-attempt Apixaban (noted allergy of abnormal liver enzymes).   LFTs noted to be wnl on 8/12, will recommend to recheck 1 week into therapy with Apixaban, then close monitoring thereafter.   Goal of Therapy:  Appropriate anticoagulation for indication and hepatic/renal function    Plan:  - D/c Heparin - Start Apixaban 5 mg per tube twice daily - F/u hepatic panel ~1 week after initiating Apixaban - Will plan to provide education prior to discharge  Thank you for allowing pharmacy to be a part of this patient's care.  Alycia Rossetti, PharmD, BCPS Clinical  Pharmacist Clinical phone for 11/02/2020: [phone] 11/02/2020 2:26 PM   **Pharmacist phone directory can now be found on [email or web address] ([email or web address]).  Listed under [place].

## 2020-11-02 NOTE — Plan of Care (Signed)
  Problem: Safety: Goal: Ability to remain free from injury will improve Outcome: Progressing   Problem: Skin Integrity: Goal: Risk for impaired skin integrity will decrease Outcome: Progressing   Problem: Education: Goal: Knowledge of disease or condition will improve Outcome: Progressing   Problem: Nutrition: Goal: Risk of aspiration will decrease Outcome: Progressing

## 2020-11-02 NOTE — Progress Notes (Signed)
PT Cancellation Note  Patient Details Name: Clifford [name] MRN: [id number] DOB: 13-Apr-1950   Cancelled Treatment:    Reason Eval/Treat Not Completed: (P) Patient at procedure or test/unavailable Will continue efforts per PT POC as schedule permits.   Kara Pacer [name] 11/02/2020, 9:52 AM

## 2020-11-02 NOTE — Progress Notes (Signed)
OT Cancellation Note  Patient Details Name: Clifford [name] MRN: [id number] DOB: Apr 21, 1950   Cancelled Treatment:    Reason Eval/Treat Not Completed: Patient at procedure or test/ unavailable (Off the floor. Will return as schedule allows.)  [place], OTR/L Acute Rehab Pager: [phone] Office: [phone] 11/02/2020, 9:49 AM

## 2020-11-02 NOTE — Procedures (Signed)
Interventional Radiology Procedure Note  Procedure: G tube placement  Indication: Stroke. Dysphagia  Findings: Please refer to procedural dictation for full description.  Complications: None  EBL: < 10 mL  Miachel Roux, MD [phone]

## 2020-11-03 DIAGNOSIS — R531 Weakness: Secondary | ICD-10-CM | POA: Diagnosis not present

## 2020-11-03 LAB — GLUCOSE, CAPILLARY
Glucose-Capillary: 108 mg/dL — ABNORMAL HIGH (ref 70–99)
Glucose-Capillary: 112 mg/dL — ABNORMAL HIGH (ref 70–99)
Glucose-Capillary: 122 mg/dL — ABNORMAL HIGH (ref 70–99)
Glucose-Capillary: 111 mg/dL — ABNORMAL HIGH (ref 70–99)
Glucose-Capillary: 141 mg/dL — ABNORMAL HIGH (ref 70–99)
Glucose-Capillary: 110 mg/dL — ABNORMAL HIGH (ref 70–99)

## 2020-11-03 LAB — CBC
HCT: 41.3 % (ref 39.0–52.0)
Hemoglobin: 12.9 g/dL — ABNORMAL LOW (ref 13.0–17.0)
MCH: 26.1 pg (ref 26.0–34.0)
MCHC: 31.2 g/dL (ref 30.0–36.0)
MCV: 83.6 fL (ref 80.0–100.0)
Platelets: 134 10*3/uL — ABNORMAL LOW (ref 150–400)
RBC: 4.94 MIL/uL (ref 4.22–5.81)
RDW: 15.6 % — ABNORMAL HIGH (ref 11.5–15.5)
WBC: 9.2 10*3/uL (ref 4.0–10.5)
nRBC: 0 % (ref 0.0–0.2)

## 2020-11-03 LAB — BASIC METABOLIC PANEL
Anion gap: 7 (ref 5–15)
BUN: 62 mg/dL — ABNORMAL HIGH (ref 8–23)
CO2: 25 mmol/L (ref 22–32)
Calcium: 9.8 mg/dL (ref 8.9–10.3)
Chloride: 117 mmol/L — ABNORMAL HIGH (ref 98–111)
Creatinine, Ser: 1.56 mg/dL — ABNORMAL HIGH (ref 0.61–1.24)
GFR, Estimated: 47 mL/min — ABNORMAL LOW (ref >60)
Glucose, Bld: 115 mg/dL — ABNORMAL HIGH (ref 70–99)
Potassium: 4.4 mmol/L (ref 3.5–5.1)
Sodium: 149 mmol/L — ABNORMAL HIGH (ref 135–145)

## 2020-11-03 MED ORDER — JEVITY 1.5 CAL/FIBER PO LIQD
1000.0000 mL | ORAL | Status: DC
  Administered 2020-11-03 – 2020-11-30 (×20): 1000 mL
  Filled 2020-11-03 (×42): qty 1000

## 2020-11-03 NOTE — Plan of Care (Signed)
  Problem: Nutrition: Goal: Risk of aspiration will decrease Outcome: Progressing Goal: Dietary intake will improve Outcome: Progressing   

## 2020-11-03 NOTE — Progress Notes (Signed)
Physical Therapy Treatment Patient Details Name: Clifford [name] MRN: [id number] DOB: 1950-12-24 Today's Date: 11/03/2020    History of Present Illness 70 yo male presented to ED on 8/11 for R sided headache and worsening L UE weakness. Patient recently admitted 8/3-8/5 for R CVA with L visual deficits and L sided weakness. MRI with mild enlargement of R MCA/border zone infarct since 10/07/20 MRI and increased cytotoxic edema and petechial hemorrhage without midline shift. Palliative Care consulted and in detailed family discussions, with prognosis provided by Neurology (more likely able to regain ability to talk and participate in self cares, unlikely to regain ability to walk, uncertain if able to regain ability to swallow, very unlikely to improve to full independence), it was decided to place PEG and pursue SNF rehab, PEG was placed 8/30. 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 Pt non-verbal and remained with eyes closed throughout, mostly +2 maxA to totalA for rolling and transfer to/from EOB but once seated ~5 mins, pt progressed to minA/min guard at times for seated balance with RUE supporting. Pt able to stand with +2 modA for ~15 seconds but unable to progress steps/gait this date. Pt nodding yes appropriately in response to some questions. Pt continues to benefit from PT services to progress toward functional mobility goals.   Follow Up Recommendations  SNF;Supervision/Assistance - 24 hour     Equipment Recommendations  Other (comment) (TBA; currently could benefit from specialized wheelchair eval, mechanical lift, and hospital bed)    Recommendations for Other Services       Precautions / Restrictions Precautions Precautions: Fall Precaution Comments: L visual field deficit, R gaze preference, L inattention, L  hemparesis; G-tube and abdominal binder, R hand mitt Restrictions Weight Bearing Restrictions: No    Mobility  Bed Mobility Overal bed mobility: Needs Assistance Bed Mobility: Rolling;Sidelying to Sit;Sit to Sidelying Rolling: Max assist;Total assist;+2 for physical assistance Sidelying to sit: +2 for physical assistance;Total assist     Sit to sidelying: +2 for physical assistance;Total assist General bed mobility comments: pt not grimacing with mobility but remains lethargic throughout, when rolled to L side while donning abdominal binder, pt able to maintain R hand on bed rail but unable to assist to pull trunk over; when knees placed in bent position, pt able to maintain bent legs; once upright, delayed/decreased righting reactions    Transfers Overall transfer level: Needs assistance   Transfers: Sit to/from Stand Sit to Stand: +2 physical assistance;Mod assist;From elevated surface         General transfer comment: Pt stood ~15 seconds with +49moA to rise and B feet blocked but needs +2 maxA to maintain due to slow/decreased righting reactions  Ambulation/Gait             General Gait Details: pt too lethargic to attempt, manual A to slide LLE closer to RLE, unable to follow instructions to step with RLE today; eyes closed throughout standing   Stairs             Wheelchair Mobility    Modified Rankin (Stroke Patients Only) Modified Rankin (Stroke Patients Only) Pre-Morbid Rankin Score: Moderately severe disability Modified Rankin: Severe disability     Balance Overall balance assessment: Needs assistance   Sitting balance-Leahy Scale: Poor (Zero with brief moments of Poor balance) Sitting balance -  Comments: At EOB pt initially max A for balance with consistent posterior lean but after ~5 minutes seated, pt able to use RUE to assist with seated balance, progressed for brief periods to minA/min guard but mostly mod/maxA for seated balance especially  when performing dynamic tasks (RLE exercises/leaning activity) Postural control: Posterior lean;Left lateral lean Standing balance support: Bilateral upper extremity supported Standing balance-Leahy Scale: Zero Standing balance comment: maxA +2 to maintain upright, no buckling but pt fatigues after ~15 seconds and returns to sitting                            Cognition Arousal/Alertness: Lethargic Behavior During Therapy: Restless (grasping with R hand at objects and at times tapping with R thumb/fingers on bed/therapist) Overall Cognitive Status: Difficult to assess Area of Impairment: Following commands;Problem solving;Attention;Awareness;Safety/judgement                   Current Attention Level: Focused   Following Commands: Follows one step commands inconsistently;Follows one step commands with increased time Safety/Judgement: Decreased awareness of deficits;Decreased awareness of safety Awareness: Intellectual Problem Solving: Slow processing;Requires verbal cues;Requires tactile cues;Decreased initiation;Difficulty sequencing General Comments: Pt with delayed response to all commands, following ~25% of 1-step cues with max multimodal cues, does better with tactile/tapping to stimulate target muscle. Pt eyes remained closed throughout but did respond with nods "yes" to 4 or 5 questions. Pt tending to keep head down to L side today (previously had R gaze preference).      Exercises Other Exercises Other Exercises: seated RLE AAROM: hip flexion, LAQ x10 reps ea Other Exercises: seated RUE ROM: PROM shoulder IR/ER (limited ER), shoulder flexion (AAROM), shoulder abduction (mostly PROM) x5 reps ea Other Exercises: supine RLE AAROM: heel slides x 5 reps Other Exercises: RUE gross grasp x 5 reps Other Exercises: STS x 1 rep for BLE strengthening    General Comments General comments (skin integrity, edema, etc.): VSS on RA, pt unable to state any needs or any reaction  to change in posture (not able to indicate dizziness or fatigue), and eyes remain closed throughout      Pertinent Vitals/Pain Pain Assessment: Faces Faces Pain Scale: No hurt Pain Intervention(s): Limited activity within patient's tolerance;Monitored during session;Repositioned    Home Living                      Prior Function            PT Goals (current goals can now be found in the care plan section) Acute Rehab PT Goals Patient Stated Goal: none stated PT Goal Formulation: With patient Time For Goal Achievement: 11/10/20 (edited per recent goal update (on 8/24), therapist forgot to update date here) Potential to Achieve Goals: Poor Progress towards PT goals: Progressing toward goals (slow progress; lethargy limiting)    Frequency    Min 3X/week      PT Plan Current plan remains appropriate    Co-evaluation PT/OT/SLP Co-Evaluation/Treatment: Yes Reason for Co-Treatment: Complexity of the patient's impairments (multi-system involvement);For patient/therapist safety;To address functional/ADL transfers PT goals addressed during session: Mobility/safety with mobility;Balance;Strengthening/ROM        AM-PAC PT "6 Clicks" Mobility   Outcome Measure  Help needed turning from your back to your side while in a flat bed without using bedrails?: Total Help needed moving from lying on your back to sitting on the side of a flat bed without using bedrails?: Total Help needed  moving to and from a bed to a chair (including a wheelchair)?: Total Help needed standing up from a chair using your arms (e.g., wheelchair or bedside chair)?: A Lot Help needed to walk in hospital room?: Total Help needed climbing 3-5 steps with a railing? : Total 6 Click Score: 7    End of Session Equipment Utilized During Treatment: Gait belt;Other (comment) (abdominal binder donned supine prior to EOB mobility, remained on for safety once back in bed (pt restless and tending to grasp items  in bed with R hand)) Activity Tolerance: Patient limited by lethargy Patient left: in bed;with call [place]/phone within reach;with bed alarm set;Other (comment) (R mitt on, abdominal binder donned for safety, G-tube reattached, heels floated) Nurse Communication: Mobility status PT Visit Diagnosis: Unsteadiness on feet (R26.81);Muscle weakness (generalized) (M62.81);Other symptoms and signs involving the nervous system (R29.898);Other abnormalities of gait and mobility (R26.89)     Time: [id number] PT Time Calculation (min) (ACUTE ONLY): 32 min  Charges:  $Therapeutic Exercise: 8-22 mins                     Vaden Becherer P., PTA Acute Rehabilitation Services Pager: [phone] Office: [place] 11/03/2020, 1:33 PM

## 2020-11-03 NOTE — Progress Notes (Signed)
Occupational Therapy Treatment Patient Details Name: Clifford [name] MRN: [id number] DOB: 03-12-1950 Today's Date: 11/03/2020    History of present illness 70 yo male presented to ED on 8/11 for R sided headache and worsening L UE weakness. Patient recently admitted 8/3-8/5 for R CVA with L visual deficits and L sided weakness. MRI with mild enlargement of R MCA/border zone infarct since 10/07/20 MRI and increased cytotoxic edema and petechial hemorrhage without midline shift. Palliative Care consulted and in detailed family discussions, with prognosis provided by Neurology (more likely able to regain ability to talk and participate in self cares, unlikely to regain ability to walk, uncertain if able to regain ability to swallow, very unlikely to improve to full independence), it was decided to place PEG and pursue SNF rehab, PEG was placed 8/30. 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  Patient with increased lethargy, and difficulty maintaining his level of alertness.  Patient was able to grasp a wash cloth, but unable to bring the cloth to his face.  Patient seen in conjunction with PT due to complexity of patient's deficits, see below.  Patient able to sit edge of bed and attempt sit to stand, but needed up to +2 Max A.  OT to continue trials in the acute setting to maximize his functional status.  SNF continues to be recommended.    Follow Up Recommendations  SNF    Equipment Recommendations  None recommended by OT    Recommendations for Other Services      Precautions / Restrictions Precautions Precautions: Fall Precaution Comments: L visual field deficit, R gaze preference, L inattention, L hemparesis; G-tube and abdominal binder, R hand mitt Restrictions Weight Bearing Restrictions: No       Mobility Bed Mobility Overal bed mobility: Needs Assistance Bed Mobility: Rolling;Sidelying to Sit;Sit to  Sidelying Rolling: Max assist;Total assist;+2 for physical assistance Sidelying to sit: +2 for physical assistance;Total assist     Sit to sidelying: +2 for physical assistance;Total assist General bed mobility comments: pt not grimacing with mobility but remains lethargic throughout, when rolled to L side while donning abdominal binder, pt able to maintain R hand on bed rail but unable to assist to pull trunk over; when knees placed in bent position, pt able to maintain bent legs; once upright, delayed/decreased righting reactions    Transfers Overall transfer level: Needs assistance   Transfers: Sit to/from Stand Sit to Stand: +2 physical assistance;Mod assist;From elevated surface         General transfer comment: Pt stood ~15 seconds with +67moA to rise and B feet blocked but needs +2 maxA to maintain due to slow/decreased righting reactions    Balance Overall balance assessment: Needs assistance Sitting-balance support: Single extremity supported;Feet supported Sitting balance-Leahy Scale: Poor Sitting balance - Comments: At EOB pt initially max A for balance with consistent posterior lean but after ~5 minutes seated, pt able to use RUE to assist with seated balance, progressed for brief periods to minA/min guard but mostly mod/maxA for seated balance especially when performing dynamic tasks (RLE exercises/leaning activity) Postural control: Posterior lean;Left lateral lean Standing balance support: Bilateral upper extremity supported Standing balance-Leahy Scale: Zero Standing balance comment: maxA +2 to maintain upright, no buckling but pt fatigues after ~15 seconds and returns to sitting  ADL either performed or assessed with clinical judgement   ADL Overall ADL's : Needs assistance/impaired     Grooming: Total assistance;Bed level           Upper Body Dressing : Total assistance;Bed level   Lower Body Dressing: Total  assistance;Bed level                       Vision   Alignment/Gaze Preference: Head turned;Gaze right   Perception     Praxis      Cognition Arousal/Alertness: Lethargic Behavior During Therapy: Flat affect Overall Cognitive Status: Difficult to assess Area of Impairment: Following commands;Problem solving;Attention;Awareness;Safety/judgement                   Current Attention Level: Focused   Following Commands: Follows one step commands inconsistently;Follows one step commands with increased time Safety/Judgement: Decreased awareness of deficits;Decreased awareness of safety Awareness: Intellectual Problem Solving: Slow processing;Requires verbal cues;Requires tactile cues;Decreased initiation;Difficulty sequencing General Comments: did nod his head yes appropriately a few times...        Exercises    Shoulder Instructions       General Comments     Pertinent Vitals/ Pain       Pain Assessment: Faces Faces Pain Scale: No hurt Pain Intervention(s): Monitored during session                                                          Frequency  Min 2X/week        Progress Toward Goals  OT Goals(current goals can now be found in the care plan section)  Progress towards OT goals: Not progressing toward goals - comment  Acute Rehab OT Goals Patient Stated Goal: none stated OT Goal Formulation: Patient unable to participate in goal setting Time For Goal Achievement: 11/10/20 Potential to Achieve Goals: [place] Discharge plan remains appropriate    Co-evaluation    PT/OT/SLP Co-Evaluation/Treatment: Yes Reason for Co-Treatment: Complexity of the patient's impairments (multi-system involvement);For patient/therapist safety;To address functional/ADL transfers PT goals addressed during session: Mobility/safety with mobility;Balance;Strengthening/ROM OT goals addressed during session: ADL's and self-care       AM-PAC OT "6 Clicks" Daily Activity     Outcome Measure   Help from another person eating meals?: Total Help from another person taking care of personal grooming?: Total Help from another person toileting, which includes using toliet, bedpan, or urinal?: Total Help from another person bathing (including washing, rinsing, drying)?: Total Help from another person to put on and taking off regular upper body clothing?: Total Help from another person to put on and taking off regular lower body clothing?: Total 6 Click Score: 6    End of Session Equipment Utilized During Treatment: Gait belt  OT Visit Diagnosis: Unsteadiness on feet (R26.81);Muscle weakness (generalized) (M62.81);Other symptoms and signs involving cognitive function   Activity Tolerance Patient limited by lethargy   Patient Left in bed;with call [place]/phone within reach;with bed alarm set   Nurse Communication          Time: [phone] OT Time Calculation (min): 29 min  Charges: OT General Charges $OT Visit: 1 Visit OT Treatments $Self Care/Home Management : 8-22 mins  11/03/2020  RP, OTR/L  Acute Rehabilitation Services  Office:  [phone]    Janice Coffin  Clifford [name] 11/03/2020, 1:42 PM

## 2020-11-03 NOTE — Progress Notes (Signed)
  Speech Language Pathology Treatment: Cognitive-Linquistic;Dysphagia  Patient Details Name: Clifford [name] MRN: [id number] DOB: 26-Mar-1950 Today's Date: 11/03/2020 Time: [id number] SLP Time Calculation (min) (ACUTE ONLY): 24 min  Assessment / Plan / Recommendation Clinical Impression  NPO order for IR procedure still in place prior to tx this date. RN clarified that pt is ok to resume PO trials with SLP and MD will be discontinuing order.   Pt alert with mildly improved mentation as indicated by following of simple 1 step commands for completion of oral care and increasing vocal intensity for speech intelligibility, which continues to be reduced.  He is oriented to self and disoriented to place (city, country, [employer] research officer, government) this date. Attention to L visual field and overall sustained attention to PO intake continues to be poor. Oral manipulation of ice chips was minimal to absent, requiring oral suction, with delayed cough noted. Thin liquids via teaspoon and cup anteriorly spilled and were without overt s/sx of aspiration, however cannot r/o silent aspiration given clinical context. As trials progressed, pt became increasingly lethargic and was less attentive to POs, requiring maximal multimodal cues to accept and swallow bite of puree. Recommend pt remain NPO with SLP to f/u for PO readiness and cognitive/speech treatment.    HPI HPI: 70yo male admitted 10/14/20 with right side headache and LUE weakness. 10/19/20 pt was noted to have increased LUE weakness. 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 CTHead = evolving RMCA infarct with cytotoxic edema, mass effect      SLP Plan  Continue with current plan of care       Recommendations  Diet recommendations: NPO Medication Administration: Via alternative means                Oral Care Recommendations: Oral care QID Follow up Recommendations: Skilled Nursing facility;24 hour  supervision/assistance SLP Visit Diagnosis: Dysphagia, unspecified (R13.10) Plan: Continue with current plan of care       [place], [place], [place] Office Number: [phone]  LORAS SLYTER 11/03/2020, 9:28 AM

## 2020-11-03 NOTE — Progress Notes (Signed)
Referring Physician(s):  Dr Waldron Labs  Supervising Physician: Ruthann Cancer  Patient Status:  [facility] - In-pt  Chief Complaint:  Dysphagia Percutaneous gastric tube placed in IR 11/02/20  Subjective:   Resting comfortably Tries to communicate some Afebrile  Allergies: Apixaban, Lisinopril, and Simvastatin  Medications: Prior to Admission medications   Medication Sig Start Date End Date Taking? Authorizing Provider  atorvastatin (LIPITOR) 80 MG tablet Take 40 mg by mouth 1 day or 1 dose. 12/04/19  Yes [provider]  dabigatran (PRADAXA) 150 MG CAPS capsule Take 1 capsule (150 mg total) by mouth 2 (two) times daily. 10/08/20  Yes Elgergawy, Silver Huguenin, MD  finasteride (PROSCAR) 5 MG tablet Take 5 mg by mouth daily at 6 (six) AM. 08/05/20  Yes [provider]  hydrochlorothiazide (HYDRODIURIL) 25 MG tablet Take 25 mg by mouth daily.   Yes [provider]  losartan (COZAAR) 50 MG tablet Take 100 mg by mouth daily.   Yes [provider]  potassium chloride SA (KLOR-CON) 20 MEQ tablet Take 10 mEq by mouth daily. 12/04/19  Yes [provider]  psyllium (METAMUCIL) 58.6 % packet Take 1 packet by mouth daily. 04/08/20  Yes [provider]     Vital Signs: BP (!) 155/90 (BP Location: Right Arm)   Pulse 93   Temp 98.5 F (36.9 C) (Oral)   Resp 17   Ht '5\' 5"'$  (1.651 m)   Wt 192 lb 7.4 oz (87.3 kg)   SpO2 100%   BMI 32.03 kg/m   Physical Exam Vitals reviewed.  Skin:    General: Skin is warm.     Comments: Site is clean and dry G tube intact NT no bleeding No hematoma    Imaging: CT HEAD WO CONTRAST (5MM)  Result Date: 11/01/2020 CLINICAL DATA:  Follow-up stroke. EXAM: CT HEAD WITHOUT CONTRAST TECHNIQUE: Contiguous axial images were obtained from the base of the skull through the vertex without intravenous contrast. COMPARISON:  October 28, 2020.  October 19, 2020. FINDINGS: Brain: Large low density area is seen involving the  posterior portion of the right middle cerebral arterial territory consistent with evolving subacute infarction. There is approximately 10 mm of right to left midline shift which is slightly enlarged compared to prior exam. Ventricular size is within normal limits. No definite evidence of acute hemorrhage is noted. Vascular: No hyperdense vessel or unexpected calcification. Skull: Normal. Negative for fracture or focal lesion. Sinuses/Orbits: No acute finding. Other: None. IMPRESSION: Continued evolving subacute infarction seen involving the posterior portion of the right middle cerebral arterial territory. There is approximately 10 mm of right to left midline shift which may be slightly enlarged compared to prior exam. No ventricular dilatation is noted. Electronically Signed   By: Marijo Conception M.D.   On: 11/01/2020 12:29   IR GASTROSTOMY TUBE MOD SED  Result Date: 11/02/2020 INDICATION: CVA Dysphagia EXAM: Fluoroscopy guided G-tube placement MEDICATIONS: Ancef 2 g IV; Antibiotics were administered within 1 hour of the procedure. Glucagon 1 mg IV ANESTHESIA/SEDATION: Versed 1 mg IV; Fentanyl 50 mcg IV Moderate Sedation Time:  19 minutes The patient was continuously monitored during the procedure by the interventional radiology nurse under my direct supervision. CONTRAST:  15 mL of Omnipaque 240-administered into the gastric lumen. FLUOROSCOPY TIME:  Fluoroscopy Time: 10 minutes 0 seconds (83 mGy). COMPLICATIONS: None immediate. PROCEDURE: Informed written consent was obtained from the patient's family after a thorough discussion of the procedural risks, benefits and alternatives. All questions were  addressed. Maximal Sterile Barrier Technique was utilized including caps, mask, sterile gowns, sterile gloves, sterile drape, hand hygiene and skin antiseptic. A timeout was performed prior to the initiation of the procedure. Oral contrast was administered through NG tube over night prior to the procedure in order  to opacify the colon. An orogastric tube was placed with fluoroscopic guidance. The anterior abdomen was prepped and draped in sterile fashion. Ultrasound evaluation of the left upper quadrant was performed to confirm the position of the liver. The skin and subcutaneous tissues were anesthetized with 1% lidocaine. Single gastropexy was placed. 17 gauge needle was directed into the distended stomach with fluoroscopic guidance. A wire was advanced into the stomach. 9-French vascular sheath was placed and the orogastric tube was snared using a Gooseneck snare device. The orogastric tube and snare were pulled out of the patient's mouth. The snare device was connected to a 20-French gastrostomy tube. The snare device and gastrostomy tube were pulled through the patient's mouth and out the anterior abdominal wall. The gastrostomy tube was cut to an appropriate length. The gastropexy was cut. Contrast injection through gastrostomy tube confirmed placement within the stomach. Fluoroscopic images were obtained for documentation. The gastrostomy tube was flushed with normal saline. IMPRESSION: 72 French pull-through gastrostomy tube placed using fluoroscopic guidance. Electronically Signed   By: Miachel Roux M.D.   On: 11/02/2020 13:13   DG Abd Portable 1V  Result Date: 11/02/2020 CLINICAL DATA:  Evaluate contrast in bowel before G-tube EXAM: PORTABLE ABDOMEN - 1 VIEW COMPARISON:  None. FINDINGS: There is an enteric tube with tip terminating in the gastric antrum. Contrast material is seen in the large bowel. No residual contrast material in the small bowel was identified. Normal bowel-gas pattern. No abnormal calcifications. The osseous structures are unremarkable. IMPRESSION: Enteric tube with tip in the gastric antrum. Normal bowel gas pattern with contrast material limited to the large bowel. Electronically Signed   By: Albin Felling M.D.   On: 11/02/2020 08:29    Labs:  CBC: Recent Labs    10/31/20 0047  11/01/20 0123 11/02/20 0157 11/03/20 0356  WBC 7.6 7.0 11.7* 9.2  HGB 12.1* 12.4* 13.1 12.9*  HCT 40.2 40.2 41.8 41.3  PLT 144* 149* 154 134*    COAGS: Recent Labs    10/08/20 0641 10/08/20 1451 10/14/20 0920 11/02/20 0157  INR 2.1* 2.0* 1.4* 1.2  APTT  --   --  82*  --     BMP: Recent Labs    10/31/20 0047 11/01/20 0123 11/02/20 0157 11/03/20 0356  NA 144 144 146* 149*  K 4.7 4.2 4.5 4.4  CL 109 113* 114* 117*  CO2 '25 23 24 25  '$ GLUCOSE 137* 134* 116* 115*  BUN 55* 54* 56* 62*  CALCIUM 9.8 9.3 9.8 9.8  CREATININE 1.33* 1.22 1.32* 1.56*  GFRNONAA 57* >60 58* 47*    LIVER FUNCTION TESTS: Recent Labs    10/06/20 1434 10/14/20 0920 10/15/20 0500  BILITOT 0.8 1.0 0.8  AST '26 22 19  '$ ALT '15 13 12  '$ ALKPHOS 81 77 65  PROT 7.8 7.1 6.2*  ALBUMIN 4.3 3.6 3.0*    Assessment and Plan:  Percutaneous gastric tube in lace May use now  Electronically Signed: Lavonia Drafts, PA-C 11/03/2020, 9:30 AM   I spent a total of 15 Minutes at the the patient's bedside AND on the patient's hospital floor or unit, greater than 50% of which was counseling/coordinating care for Gastric tube

## 2020-11-03 NOTE — Progress Notes (Signed)
Nutrition Follow-up  DOCUMENTATION CODES:  Not applicable  INTERVENTION:  Continue TF via PEG: -Transition to Jevity 1.5 @ 55ml/hr (1320ml/d)  -45ml Prosource TF BID -d/c Nutrisource fiber (Jevity contains fiber) -free water per MD  Provides 2060 kcals, 106 grams protein, 1003ml free water   NUTRITION DIAGNOSIS:  Inadequate oral intake related to dysphagia as evidenced by NPO status. -- ongoing  GOAL:  Patient will meet greater than or equal to 90% of their needs -- met with TF  MONITOR:  TF tolerance, Weight trends, Labs, I & O's  REASON FOR ASSESSMENT:  Consult Enteral/tube feeding initiation and management  ASSESSMENT:  Pt with a PMH significant for CHF, HTN, HLD, PE, recent R MCA/PCA watershed ischemic stroke admitted with evolving/increasing size of stroke, petechial hemorrhages, cytotoxic edema, concern for new seizures.  8/19 Cortrak placed (gastric tip) 8/30 PEG placed   Pt remains NPO and is tolerating TF via PEG per RN. Current TF regimen: Osmolite 1.5 @ 55ml/hr, 45ml Prosource TF BID, nutrisource fiber daily.   No UOP documented x24 hours I/O: +5.9L since admit  Medications: protonix, SSI IVF: NS @ 50ml/hr Labs: Na 149 (H), BUN 62 (H), Cr 1.56 (H) CBGs: 109-108-112  Diet Order:   Diet Order     None      EDUCATION NEEDS:  No education needs have been identified at this time  Skin:  Skin Assessment: Reviewed RN Assessment  Last BM:  8/30 type 6  Height:  Ht Readings from Last 1 Encounters:  10/14/20 5' 5" (1.651 m)   Weight:  Wt Readings from Last 1 Encounters:  11/01/20 87.3 kg   Ideal Body Weight:  61.82 kg  BMI:  Body mass index is 32.03 kg/m.  Estimated Nutritional Needs:  Kcal:  2000-2200 Protein:  100-120 grams Fluid:  >2L     , MS, RD, LDN (she/her/hers) RD pager number and weekend/on-call pager number located in Amion.  

## 2020-11-03 NOTE — NC FL2 (Signed)
[place] LEVEL OF CARE SCREENING TOOL     IDENTIFICATION  Patient Name: Clifford [name] Birthdate: [birth year] Sex: male Admission Date (Current Location): 10/14/2020  [facility] and [place] Number:      Facility and Address:  [facility] [place] [address], [place], [place] [address]      Provider Number: [id number]  Attending Physician Name and Address:  Caren Griffins, MD  Relative Name and Phone Number:       Current Level of Care: Hospital Recommended Level of Care: [place] Prior Approval Number:    Date Approved/Denied:   PASRR Number: Manual review  Discharge Plan: SNF    Current Diagnoses: Patient Active Problem List   Diagnosis Date Noted   Advanced care planning/counseling discussion    Goals of care, counseling/discussion    Palliative care by specialist    Left-sided weakness 10/14/2020   Prediabetes 10/14/2020   History of pulmonary embolism 10/14/2020   Chronic anticoagulation 10/14/2020   Nicotine dependence, cigarettes, uncomplicated 0000000   Petechial hemorrhage 10/14/2020   Acute stroke due to ischemia ([place]) 10/14/2020   Stroke ([place]) 10/06/2020   Essential hypertension 10/06/2020   Mixed hyperlipidemia 10/06/2020    Orientation RESPIRATION BLADDER Height & Weight     Self, Time, Situation, Place  Normal Incontinent Weight: 192 lb 7.4 oz (87.3 kg) Height:  '5\' 5"'$  (165.1 cm)  BEHAVIORAL SYMPTOMS/MOOD NEUROLOGICAL BOWEL NUTRITION STATUS    Convulsions/Seizures Incontinent Feeding tube (Osmolite 1.5)  AMBULATORY STATUS COMMUNICATION OF NEEDS Skin   Extensive Assist Non-Verbally Normal                       Personal Care Assistance Level of Assistance  Bathing, Feeding, Dressing Bathing Assistance: Maximum assistance Feeding assistance: Maximum assistance Dressing Assistance: Maximum assistance     Functional Limitations Info  Speech     Speech Info: Impaired (dysarthria)     SPECIAL CARE FACTORS FREQUENCY  PT (By licensed PT), OT (By licensed OT), Speech therapy     PT Frequency: 5x/wk OT Frequency: 5x/wk     Speech Therapy Frequency: 5x/wk      Contractures Contractures Info: Not present    Additional Factors Info  Code Status, Allergies, Insulin Sliding Scale Code Status Info: DNR Allergies Info: Apixaban, Lisinopril, Simvastatin   Insulin Sliding Scale Info: see DC summary       Current Medications (11/03/2020):  This is the current hospital active medication list Current Facility-Administered Medications  Medication Dose Route Frequency Provider Last Rate Last Admin   0.9 %  sodium chloride infusion   Intravenous Continuous Elgergawy, Silver Huguenin, MD 50 mL/hr at 11/03/20 0527 Infusion Verify at 11/03/20 0527   acetaminophen (TYLENOL) tablet 650 mg  650 mg Oral Q4H PRN Vernelle Emerald, MD   650 mg at 10/20/20 1352   Or   acetaminophen (TYLENOL) 160 MG/5ML solution 650 mg  650 mg Per Tube Q4H PRN Vernelle Emerald, MD       Or   acetaminophen (TYLENOL) suppository 650 mg  650 mg Rectal Q4H PRN Shalhoub, Sherryll Burger, MD       amLODipine (NORVASC) tablet 10 mg  10 mg Per Tube Daily Darlina Sicilian, RPH   10 mg at 11/01/20 1131   apixaban (ELIQUIS) tablet 5 mg  5 mg Per Tube BID Elgergawy, Silver Huguenin, MD   5 mg at 11/02/20 2141   atorvastatin (LIPITOR) tablet 40 mg  40 mg  Per Tube Daily Darlina Sicilian, RPH   40 mg at 11/01/20 1131   erythromycin ophthalmic ointment   Right Eye TID Elgergawy, Silver Huguenin, MD   1 application at [id number]   feeding supplement (OSMOLITE 1.5 CAL) liquid 1,000 mL  1,000 mL Per Tube Continuous Shelly Coss, MD 55 mL/hr at 11/01/20 0500 Infusion Verify at 11/01/20 0500   feeding supplement (PROSource TF) liquid 45 mL  45 mL Per Tube BID Shelly Coss, MD   45 mL at 11/02/20 2142   fiber (NUTRISOURCE FIBER) 1 packet  1 packet Per Tube Daily Henri Medal, [place]   1 packet at 11/01/20 1131   hydrALAZINE  (APRESOLINE) tablet 25 mg  25 mg Per Tube QID Elgergawy, Silver Huguenin, MD   25 mg at 11/02/20 2142   insulin aspart (novoLOG) injection 0-9 Units  0-9 Units Subcutaneous Q4H Dana Allan I, MD   1 Units at 11/02/20 2047   labetalol (NORMODYNE) injection 10 mg  10 mg Intravenous Q2H PRN Vernelle Emerald, MD   10 mg at 10/24/20 2320   levETIRAcetam (KEPPRA) 100 MG/ML solution 500 mg  500 mg Per Tube BID Edwin Dada, MD   500 mg at 11/02/20 2143   MEDLINE mouth rinse  15 mL Mouth Rinse BID Dana Allan I, MD   15 mL at 11/02/20 2143   ondansetron (ZOFRAN) injection 4 mg  4 mg Intravenous Q6H PRN Shalhoub, Sherryll Burger, MD       pantoprazole sodium (PROTONIX) 40 mg oral suspension 40 mg  40 mg Per Tube Daily Elgergawy, Silver Huguenin, MD   40 mg at 11/01/20 1132   polyethylene glycol (MIRALAX / GLYCOLAX) packet 17 g  17 g Per Tube Daily PRN Darlina Sicilian, RPH       topiramate (TOPAMAX) tablet 50 mg  50 mg Per Tube BID Elgergawy, Silver Huguenin, MD   50 mg at 11/02/20 2143     Discharge Medications: Please see discharge summary for a list of discharge medications.  Relevant Imaging Results:  Relevant Lab Results:   Additional Information SS#: [id number]  Geralynn Ochs, LCSW

## 2020-11-03 NOTE — Progress Notes (Signed)
PROGRESS NOTE  Clifford [name] [id number] DOB: [birth year] DOA: 10/14/2020 PCP: Pcp, No   LOS: 20 days   Brief Narrative / Interim history: Clifford [name] is a 70 y.o. 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   Had an initial stroke 8/3, was admitted 2 days, then discharged with [facility].  After about 4 days at home, he developed worsening of his stroke symptoms, returned to the hospital. In the ER, found to have enlargement of his previous stroke with micro-hemorrhage.  Initially had relatively mild deficits still, NIHSS 4, so his Pradaxa was held and he was admitted for SNF placement for short term rehab.  Here, however, he developed progressively worsening left hemiparesis, then somnolence (see NIHSS chart in progress note 8/24).  Neurology were reconsulted.  Serial head CTs on 8/16 showed expected evolution/progression of his stroke, further edema, but no herniation or substantial midline shift.  Neurology followed the patient and recommended further supportive care and SNF placement.  An NG tube was placed as the patient lost the ability to swallow.  Palliative Care were consulted and in detailed family discussions, with prognosis provided by Neurology (more likely able to regain ability to talk and participate in self cares, unlikely to regain ability to walk, uncertain if able to regain ability to swallow, very unlikely to improve to full independence), it was decided to place PEG and pursue SNF rehab, PEG was placed 8/30.   Subjective / 24h Interval events: Tries to communicate, no significant complaints  Assessment & Plan: Principal Problem Increased petechial hemorrhage/evolution of previous right MCA/PCA territory infarct with mild edema with now increasing midline shift on repeat MRI 10/28/2020 -MRI mild enlargement with cytotoxic edema and  petechial hemorrhage with increased midline shift compared to 10/07/20. -Neurology suspect his current poor functional status is mostly due to swelling, with a component of this from free water and a component of this from bleeding.  They feel this is within the range of expected, and that it is likely to improve, with prognosis outlined above.   -He remains with significant dysphagia, placed by IR 8/30. -Continue to hold free water -Patient still complaining of headache, he will be started on Topamax as discussed with neurology Dr. Leonie Man -Repeat CT head 8/30 showing progressive CVA, midline shift is 10 mm(as opposed to 14 mm on MRI 8/25) -In light of this potential for recovery, Neurology recommend: -Continue atorvastatin -Hold free water to minimize cerebral edema, discussed with Dr. Leonie Man  Active Problems Hypertension -Continue amlodipine, remains elevated, will add hydralazine.   Suspected focal seizures Presented with left hand twitching which was nonsuppressible and clinically suspicious for stroke, and was started on Keppra.   -Keppra reduced 8/24 to eliminate sedating side effects from [place] from limiting his rehab   CKD IIIa Baseline Cr 1.2-1.4 - BMP daily for now   Dysphagia -Status post PEG tube 8/30    History of recurrent VTE -continue Eliquis   Lung nodule Incidental finding of 25m LUL nodule. Patient former smoker. -Recommend follow up CT in 6-12 months depending on neurological progression   Possible sclerotic vertebra on imaging Incidental finding.  PSA 5, unlikely this sclerosis is from metastatic prostate cancer with that level.   Chronic diastolic CHF  Appears euvolemic.  Not on diuretic at baseline   Hyperkalemia Resolved with Lokelma   Right eye pain -With some scleral injection,  discussed with ophthalmology dr Posey Pronto , likely due to abrasion from stroke and it remains open,  Scheduled Meds:  amLODipine  10 mg Per Tube Daily   apixaban  5 mg Per Tube  BID   atorvastatin  40 mg Per Tube Daily   erythromycin   Right Eye TID   feeding supplement (PROSource TF)  45 mL Per Tube BID   fiber  1 packet Per Tube Daily   hydrALAZINE  25 mg Per Tube QID   insulin aspart  0-9 Units Subcutaneous Q4H   levETIRAcetam  500 mg Per Tube BID   mouth rinse  15 mL Mouth Rinse BID   pantoprazole sodium  40 mg Per Tube Daily   topiramate  50 mg Per Tube BID   Continuous Infusions:  sodium chloride 50 mL/hr at 11/03/20 0527   feeding supplement (OSMOLITE 1.5 CAL) 1,000 mL (11/03/20 1023)   PRN Meds:.acetaminophen **OR** acetaminophen (TYLENOL) oral liquid 160 mg/5 mL **OR** acetaminophen, labetalol, ondansetron (ZOFRAN) IV, polyethylene glycol    DVT prophylaxis:  apixaban (ELIQUIS) tablet 5 mg     Code Status: DNR  Family Communication: no family at bedside   Status is: Inpatient  Remains inpatient appropriate because:IV treatments appropriate due to intensity of illness or inability to take PO  Dispo: The patient is from: Home              Anticipated d/c is to: SNF              Patient currently is not medically stable to d/c.   Difficult to place patient No   Level of care: Telemetry Medical  Consultants:  Neurology   Procedures:  none  Microbiology  none  Antimicrobials: none    Objective: Vitals:   11/02/20 1940 11/02/20 2311 11/03/20 0408 11/03/20 0740  BP: (!) 145/82 136/81 (!) 134/93 (!) 155/90  Pulse: 86 69 85 93  Resp: '20 17 18 17  '$ Temp: 98.5 F (36.9 C) 97.7 F (36.5 C) 98.6 F (37 C) 98.5 F (36.9 C)  TempSrc: Oral Oral Oral Oral  SpO2: 100% 100% 100% 100%  Weight:      Height:        Intake/Output Summary (Last 24 hours) at 11/03/2020 1036 Last data filed at 11/03/2020 1020 Gross per 24 hour  Intake 1015.09 ml  Output 450 ml  Net 565.09 ml   Filed Weights   10/28/20 0454 10/29/20 0403 11/01/20 0405  Weight: 87.4 kg 87.5 kg 87.3 kg    Examination:  Constitutional: NAD Eyes: no scleral  icterus ENMT: Mucous membranes are moist.  Neck: normal, supple Respiratory: clear to auscultation bilaterally, no wheezing, no crackles. Normal respiratory effort. No accessory muscle use.  Cardiovascular: Regular rate and rhythm, no murmurs / rubs / gallops. No LE edema. Good peripheral pulses Abdomen: non distended, no tenderness. Bowel sounds positive.  Musculoskeletal: no clubbing / cyanosis.  Skin: no rashes Neurologic: left sided weakness   Data Reviewed: I have independently reviewed following labs and imaging studies   CBC: Recent Labs  Lab 10/30/20 0035 10/31/20 0047 11/01/20 0123 11/02/20 0157 11/03/20 0356  WBC 6.4 7.6 7.0 11.7* 9.2  HGB 11.8* 12.1* 12.4* 13.1 12.9*  HCT 37.6* 40.2 40.2 41.8 41.3  MCV 83.4 84.6 83.8 82.4 83.6  PLT 139* 144* 149* 154 [id number]*   Basic Metabolic Panel: Recent Labs  Lab 10/30/20 0035 10/31/20 0047 11/01/20 0123 11/02/20 0157 11/03/20 0356  NA 142 144 144 146* 149*  K 4.2 4.7  4.2 4.5 4.4  CL 110 109 113* 114* 117*  CO2 '26 25 23 24 25  '$ GLUCOSE 130* 137* 134* 116* 115*  BUN 46* 55* 54* 56* 62*  CREATININE 1.26* 1.33* 1.22 1.32* 1.56*  CALCIUM 9.2 9.8 9.3 9.8 9.8   Liver Function Tests: No results for input(s): AST, ALT, ALKPHOS, BILITOT, PROT, ALBUMIN in the last 168 hours. Coagulation Profile: Recent Labs  Lab 11/02/20 0157  INR 1.2   HbA1C: No results for input(s): HGBA1C in the last 72 hours. CBG: Recent Labs  Lab 11/02/20 1645 11/02/20 2003 11/02/20 2310 11/03/20 0409 11/03/20 0741  GLUCAP 110* 122* 109* 108* 112*    Recent Results (from the past 240 hour(s))  Surgical pcr screen     Status: None   Collection Time: 11/02/20  1:30 AM   Specimen: Nasal Mucosa; Nasal Swab  Result Value Ref Range Status   MRSA, PCR NEGATIVE NEGATIVE Final   Staphylococcus aureus NEGATIVE NEGATIVE Final    Comment: (NOTE) The Xpert SA Assay (FDA approved for NASAL specimens in patients 52 years of age and older), is one  component of a comprehensive surveillance program. It is not intended to diagnose infection nor to guide or monitor treatment. Performed at [facility], [place] [address] [place], [place] [address]      Radiology Studies: IR GASTROSTOMY TUBE MOD SED  Result Date: 11/02/2020 INDICATION: CVA Dysphagia EXAM: Fluoroscopy guided G-tube placement MEDICATIONS: Ancef 2 g IV; Antibiotics were administered within 1 hour of the procedure. Glucagon 1 mg IV ANESTHESIA/SEDATION: Versed 1 mg IV; Fentanyl 50 mcg IV Moderate Sedation Time:  19 minutes The patient was continuously monitored during the procedure by the interventional radiology nurse under my direct supervision. CONTRAST:  15 mL of Omnipaque 240-administered into the gastric lumen. FLUOROSCOPY TIME:  Fluoroscopy Time: 10 minutes 0 seconds (83 mGy). COMPLICATIONS: None immediate. PROCEDURE: Informed written consent was obtained from the patient's family after a thorough discussion of the procedural risks, benefits and alternatives. All questions were addressed. Maximal Sterile Barrier Technique was utilized including caps, mask, sterile gowns, sterile gloves, sterile drape, hand hygiene and skin antiseptic. A timeout was performed prior to the initiation of the procedure. Oral contrast was administered through NG tube over night prior to the procedure in order to opacify the colon. An orogastric tube was placed with fluoroscopic guidance. The anterior abdomen was prepped and draped in sterile fashion. Ultrasound evaluation of the left upper quadrant was performed to confirm the position of the liver. The skin and subcutaneous tissues were anesthetized with 1% lidocaine. Single gastropexy was placed. 17 gauge needle was directed into the distended stomach with fluoroscopic guidance. A wire was advanced into the stomach. 9-French vascular sheath was placed and the orogastric tube was snared using a Gooseneck snare device. The orogastric tube and snare were  pulled out of the patient's mouth. The snare device was connected to a 20-French gastrostomy tube. The snare device and gastrostomy tube were pulled through the patient's mouth and out the anterior abdominal wall. The gastrostomy tube was cut to an appropriate length. The gastropexy was cut. Contrast injection through gastrostomy tube confirmed placement within the stomach. Fluoroscopic images were obtained for documentation. The gastrostomy tube was flushed with normal saline. IMPRESSION: 31 French pull-through gastrostomy tube placed using fluoroscopic guidance. Electronically Signed   By: Miachel Roux M.D.   On: 11/02/2020 13:13     Marzetta Board, MD, PhD [facility]  Between 7 am - 7 pm I  am available, please contact me via Amion (for emergencies) or Securechat (non urgent messages)  Between 7 pm - 7 am I am not available, please contact night coverage MD/APP via Amion

## 2020-11-04 DIAGNOSIS — R531 Weakness: Secondary | ICD-10-CM | POA: Diagnosis not present

## 2020-11-04 LAB — GLUCOSE, CAPILLARY
Glucose-Capillary: 124 mg/dL — ABNORMAL HIGH (ref 70–99)
Glucose-Capillary: 124 mg/dL — ABNORMAL HIGH (ref 70–99)
Glucose-Capillary: 125 mg/dL — ABNORMAL HIGH (ref 70–99)
Glucose-Capillary: 133 mg/dL — ABNORMAL HIGH (ref 70–99)
Glucose-Capillary: 139 mg/dL — ABNORMAL HIGH (ref 70–99)
Glucose-Capillary: 143 mg/dL — ABNORMAL HIGH (ref 70–99)

## 2020-11-04 LAB — BASIC METABOLIC PANEL
Anion gap: 7 (ref 5–15)
BUN: 55 mg/dL — ABNORMAL HIGH (ref 8–23)
CO2: 21 mmol/L — ABNORMAL LOW (ref 22–32)
Calcium: 9.3 mg/dL (ref 8.9–10.3)
Chloride: 123 mmol/L — ABNORMAL HIGH (ref 98–111)
Creatinine, Ser: 1.46 mg/dL — ABNORMAL HIGH (ref 0.61–1.24)
GFR, Estimated: 51 mL/min — ABNORMAL LOW (ref >60)
Glucose, Bld: 133 mg/dL — ABNORMAL HIGH (ref 70–99)
Potassium: 4.3 mmol/L (ref 3.5–5.1)
Sodium: 151 mmol/L — ABNORMAL HIGH (ref 135–145)

## 2020-11-04 MED ORDER — FREE WATER
200.0000 mL | Freq: Four times a day (QID) | Status: DC
  Administered 2020-11-04: 200 mL

## 2020-11-04 NOTE — Progress Notes (Signed)
PT Cancellation Note  Patient Details Name: [name] MRN: [id number] DOB: [birth year]   Cancelled Treatment:    Reason Eval/Treat Not Completed: Other (comment).  RN recommended holding as pt is lethargic with decreased arousal.  PT will check back tomorrow.   Thanks,  Verdene Lennert, PT, DPT  Acute Rehabilitation Ortho Tech Supervisor [phone] pager #[phone] office      Wells Guiles B Shahad Mazurek 11/04/2020, 2:32 PM

## 2020-11-04 NOTE — Progress Notes (Signed)
PROGRESS NOTE  Clifford [name] [id number] DOB: [birth year] DOA: 10/14/2020 PCP: Pcp, No   LOS: 21 days   Brief Narrative / Interim history: Clifford [name] is a 70 y.o. 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   Had an initial stroke 8/3, was admitted 2 days, then discharged with [facility].  After about 4 days at home, he developed worsening of his stroke symptoms, returned to the hospital. In the ER, found to have enlargement of his previous stroke with micro-hemorrhage.  Initially had relatively mild deficits still, NIHSS 4, so his Pradaxa was held and he was admitted for SNF placement for short term rehab.  Here, however, he developed progressively worsening left hemiparesis, then somnolence (see NIHSS chart in progress note 8/24).  Neurology were reconsulted.  Serial head CTs on 8/16 showed expected evolution/progression of his stroke, further edema, but no herniation or substantial midline shift.  Neurology followed the patient and recommended further supportive care and SNF placement.  An NG tube was placed as the patient lost the ability to swallow.  Palliative Care were consulted and in detailed family discussions, with prognosis provided by Neurology (more likely able to regain ability to talk and participate in self cares, unlikely to regain ability to walk, uncertain if able to regain ability to swallow, very unlikely to improve to full independence), it was decided to place PEG and pursue SNF rehab, PEG was placed 8/30.   Subjective / 24h Interval events: He seems a bit more lethargic today, he is hearing and tries to open his eyes but does not appear to be able to do so.  Can only move his right side  Assessment & Plan: Principal Problem Increased petechial hemorrhage/evolution of previous right MCA/PCA territory infarct with mild edema with  now increasing midline shift on repeat MRI 10/28/2020 -MRI mild enlargement with cytotoxic edema and petechial hemorrhage with increased midline shift compared to 10/07/20. -Neurology suspect his current poor functional status is mostly due to swelling, with a component of this from free water and a component of this from bleeding.  They feel this is within the range of expected, and that it is likely to improve, with prognosis outlined above.   -He remains with significant dysphagia, placed by IR 8/30. -Patient still complaining of headache, he will be started on Topamax as discussed with neurology Dr. Leonie [name] -Repeat CT head 8/30 showing progressive CVA, midline shift is 10 mm(as opposed to 14 mm on MRI 8/25) -Continue atorvastatin  Active Problems Hypertension -Continue amlodipine and hydralazine   Suspected focal seizures Presented with left hand twitching which was nonsuppressible and clinically suspicious for stroke, and was started on Keppra.   -Keppra reduced 8/24 to eliminate sedating side effects from [place] from limiting his rehab -will go ahead and reduce Keppra even more starting tomorrow  Hypernatremia-due to decreased free water, monitor  CKD IIIa -Baseline Cr 1.2-1.4 - BMP daily for now   Dysphagia -Status post PEG tube 8/30    History of recurrent VTE -continue Eliquis   Lung nodule Incidental finding of 38m LUL nodule. Patient former smoker. -Recommend follow up CT in 6-12 months depending on neurological progression   Possible sclerotic vertebra on imaging Incidental finding.  PSA 5, unlikely this sclerosis is from metastatic prostate cancer with that level.   Chronic diastolic CHF  Appears euvolemic.  Not on diuretic at baseline  Hyperkalemia Resolved with Lokelma   Right eye pain -With some scleral injection, discussed with ophthalmology dr Posey Pronto , likely due to abrasion from stroke and it remains open,  Scheduled Meds:  amLODipine  10 mg Per Tube Daily    apixaban  5 mg Per Tube BID   atorvastatin  40 mg Per Tube Daily   erythromycin   Right Eye TID   feeding supplement (PROSource TF)  45 mL Per Tube BID   hydrALAZINE  25 mg Per Tube QID   insulin aspart  0-9 Units Subcutaneous Q4H   levETIRAcetam  500 mg Per Tube BID   mouth rinse  15 mL Mouth Rinse BID   pantoprazole sodium  40 mg Per Tube Daily   topiramate  50 mg Per Tube BID   Continuous Infusions:  feeding supplement (JEVITY 1.5 CAL/FIBER) 1,000 mL (11/03/20 1519)   PRN Meds:.acetaminophen **OR** acetaminophen (TYLENOL) oral liquid 160 mg/5 mL **OR** acetaminophen, labetalol, ondansetron (ZOFRAN) IV, polyethylene glycol  Diet Orders (From admission, onward)     Start     Ordered   11/04/20 1120  Diet NPO time specified  Diet effective now        11/04/20 1119            DVT prophylaxis:  apixaban (ELIQUIS) tablet 5 mg     Code Status: DNR  Family Communication: Brother at bedside  Status is: Inpatient  Remains inpatient appropriate because:IV treatments appropriate due to intensity of illness or inability to take PO  Dispo: The patient is from: Home              Anticipated d/c is to: SNF              Patient currently is not medically stable to d/c.   Difficult to place patient No   Level of care: Telemetry Medical  Consultants:  Neurology   Procedures:  none  Microbiology  none  Antimicrobials: none    Objective: Vitals:   11/04/20 0325 11/04/20 0731 11/04/20 0800 11/04/20 1133  BP: (!) 161/77 (!) 157/79 (!) 160/129 (!) 157/77  Pulse: 78 74 75 75  Resp: '17 16 16 16  '$ Temp: 99.1 F (37.3 C)  98.4 F (36.9 C) 97.9 F (36.6 C)  TempSrc: Oral Oral Axillary Axillary  SpO2: 100% 100% 100% 100%  Weight:      Height:        Intake/Output Summary (Last 24 hours) at 11/04/2020 1336 Last data filed at 11/03/2020 1833 Gross per 24 hour  Intake --  Output 650 ml  Net -650 ml    Filed Weights   10/28/20 0454 10/29/20 0403 11/01/20 0405   Weight: 87.4 kg 87.5 kg 87.3 kg    Examination:  Constitutional: Lethargic, no apparent distress Eyes: No scleral icterus ENMT: mmm Neck: normal, supple Respiratory: Clear bilaterally, no wheezing, no crackles, normal respiratory effort Cardiovascular: Regular rate and rhythm, no murmurs, no edema, good pulses Abdomen: Soft, NT, ND, bowel sounds positive Musculoskeletal: no clubbing / cyanosis.  Skin: No rashes seen Neurologic: left sided weakness, moves right side independently, no new focal deficits  Data Reviewed: I have independently reviewed following labs and imaging studies   CBC: Recent Labs  Lab 10/30/20 0035 10/31/20 0047 11/01/20 0123 11/02/20 0157 11/03/20 0356  WBC 6.4 7.6 7.0 11.7* 9.2  HGB 11.8* 12.1* 12.4* 13.1 12.9*  HCT 37.6* 40.2 40.2 41.8 41.3  MCV 83.4 84.6 83.8 82.4 83.6  PLT 139* 144* 149* 154 134*  Basic Metabolic Panel: Recent Labs  Lab 10/31/20 0047 11/01/20 0123 11/02/20 0157 11/03/20 0356 11/04/20 0850  NA 144 144 146* 149* 151*  K 4.7 4.2 4.5 4.4 4.3  CL 109 113* 114* 117* 123*  CO2 '25 23 24 25 '$ 21*  GLUCOSE 137* 134* 116* 115* 133*  BUN 55* 54* 56* 62* 55*  CREATININE 1.33* 1.22 1.32* 1.56* 1.46*  CALCIUM 9.8 9.3 9.8 9.8 9.3    Liver Function Tests: No results for input(s): AST, ALT, ALKPHOS, BILITOT, PROT, ALBUMIN in the last 168 hours. Coagulation Profile: Recent Labs  Lab 11/02/20 0157  INR 1.2    HbA1C: No results for input(s): HGBA1C in the last 72 hours. CBG: Recent Labs  Lab 11/03/20 1939 11/03/20 2328 11/04/20 0324 11/04/20 0730 11/04/20 1208  GLUCAP 141* 110* 124* 125* 133*     Recent Results (from the past 240 hour(s))  Surgical pcr screen     Status: None   Collection Time: 11/02/20  1:30 AM   Specimen: Nasal Mucosa; Nasal Swab  Result Value Ref Range Status   MRSA, PCR NEGATIVE NEGATIVE Final   Staphylococcus aureus NEGATIVE NEGATIVE Final    Comment: (NOTE) The Xpert SA Assay (FDA approved  for NASAL specimens in patients 26 years of age and older), is one component of a comprehensive surveillance program. It is not intended to diagnose infection nor to guide or monitor treatment. Performed at [facility], [place] [address], [place], [place] [address]       Radiology Studies: No results found.   Marzetta Board, MD, PhD [facility]  Between 7 am - 7 pm I am available, please contact me via Amion (for emergencies) or Securechat (non urgent messages)  Between 7 pm - 7 am I am not available, please contact night coverage MD/APP via Amion

## 2020-11-04 NOTE — Progress Notes (Signed)
  Speech Language Pathology Treatment: Dysphagia  Patient Details Name: Clifford [name] MRN: [id number] DOB: March 04, 1951 Today's Date: 11/04/2020 Time: [id number] SLP Time Calculation (min) (ACUTE ONLY): 18 min  Assessment / Plan / Recommendation Clinical Impression  Followed up for PO trials. Pt very lethargic this date, did arouse some during extensive oral care from SLP however durations less than one minute. Pts brother at bedside. Pt with lingual coating improving some post oral care by SLP. Trialed single ice chip via tsp. Pt with reduced bolus manipulation, some oral holding, and left anterior spillage. Pt with palpable swallow in 1/2 trials however delayed coughing noted concerning for reduced airway protection. NPO remains most appropriate with tube feeds and meds via PEG. Pts aspiration risk remains increasingly high and PNA risk increased with  immobility, reduced oral hygiene, and known dysphagia. SLP to continue to follow. Prognosis remains very guarded.    HPI HPI: 70yo male admitted 10/14/20 with right side headache and LUE weakness. 10/19/20 pt was noted to have increased LUE weakness. 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 CTHead = evolving RMCA infarct with cytotoxic edema, mass effect      SLP Plan  Continue with current plan of care       Recommendations  Diet recommendations: NPO Medication Administration: Via alternative means                Oral Care Recommendations: Oral care QID Follow up Recommendations: Skilled Nursing facility;24 hour supervision/assistance SLP Visit Diagnosis: Dysphagia, oropharyngeal phase (R13.12) Plan: Continue with current plan of care       Clifford [name], [place]   11/04/2020, 11:23 AM

## 2020-11-04 NOTE — TOC Initial Note (Signed)
Transition of Care [facility]) - Initial/Assessment Note    Patient Details  Name: Clifford [name] MRN: [id number] Date of Birth: 1951/01/28  Transition of Care [facility]) CM/SW Contact:    Geralynn Ochs, LCSW Phone Number: 11/04/2020, 3:04 PM  Clinical Narrative:      CSW met with patient's brother at bedside to discuss discharge planning. Per patient's brother, the [place] is telling him that he also has Medicare coverage, but he cannot get the information. Brother provided CSW with name and contact information for [place] with the [place] in [place] who can provide Medicare information. CSW explained SNF placement pending patient's insurance coverage, that Medicare would be preferred if he has it, but that placement can be pursued through the [place] as well. Brother would prefer [place], if possible, as that is less than five minutes from his house. CSW attempted to contact [place] with [facility], who indicated that the patient does have Medicare Part A only, but she is unable to give CSW the information on the patient's Medicare. Per Claiborne Billings, the family should be able to get it with the patient's social security number. Per brother, they have been trying to get it with the social security number but Medicare will not provide it, either. Unable to obtain patient's Medicare information at this time. CSW to follow.             Expected Discharge Plan: Skilled Nursing Facility Barriers to Discharge: [place] (PASRR), Continued Medical Work up, Ship broker, Inadequate or no insurance   Patient Goals and CMS Choice Patient states their goals for this hospitalization and ongoing recovery are:: patient unable to participate in goal setting CMS Medicare.gov Compare Post Acute Care list provided to:: Patient Represenative (must comment) Choice offered to / list presented to : Sibling  Expected Discharge Plan and Services Expected Discharge Plan: [place] Acute Care Choice: [place] arrangements for the past 2 months: Single Family Home                                      Prior Living Arrangements/Services Living arrangements for the past 2 months: Single Family Home Lives with:: Self Patient language and need for interpreter reviewed:: No Do you feel safe going back to the place where you live?: Yes      Need for Family Participation in Patient Care: Yes (Comment) Care giver support system in place?: No (comment)   Criminal Activity/Legal Involvement Pertinent to Current Situation/Hospitalization: No - Comment as needed  Activities of Daily Living Home Assistive Devices/Equipment: Cane (specify quad or straight) ADL Screening (condition at time of admission) Patient's cognitive ability adequate to safely complete daily activities?: Yes Is the patient deaf or have difficulty hearing?: No Does the patient have difficulty seeing, even when wearing glasses/contacts?: Yes Does the patient have difficulty concentrating, remembering, or making decisions?: No Patient able to express need for assistance with ADLs?: Yes Does the patient have difficulty dressing or bathing?: Yes Independently performs ADLs?: No Communication: Independent Dressing (OT): Needs assistance Is this a change from baseline?: Change from baseline, expected to last <3days Grooming: Independent Feeding: Independent Bathing: Independent Toileting: Independent In/Out Bed: Needs assistance Is this a change from baseline?: Change from baseline, expected to last <3 days Walks in Home: Independent Does the patient have difficulty walking or climbing stairs?: Yes Weakness of Legs: None Weakness of  Arms/Hands: Left  Permission Sought/Granted Permission sought to share information with : Facility Sport and exercise psychologist, Family Supports Permission granted to share information with : Yes, Verbal Permission Granted  Share Information with NAME: Marland Kitchen  Permission  granted to share info w AGENCY: SNF, [facility]  Permission granted to share info w Relationship: Brother     Emotional Assessment Appearance:: Appears stated age Attitude/Demeanor/Rapport: Unable to Assess Affect (typically observed): Unable to Assess Orientation: : Oriented to Self, Oriented to Place, Oriented to  Time, Oriented to Situation Alcohol / Substance Use: Not Applicable Psych Involvement: No (comment)  Admission diagnosis:  Weakness [R53.1] Left-sided weakness [R53.1] Acute stroke due to ischemia [facility]) [I63.9] Patient Active Problem List   Diagnosis Date Noted   Advanced care planning/counseling discussion    Goals of care, counseling/discussion    Palliative care by specialist    Left-sided weakness 10/14/2020   Prediabetes 10/14/2020   History of pulmonary embolism 10/14/2020   Chronic anticoagulation 10/14/2020   Nicotine dependence, cigarettes, uncomplicated 01/03/2810   Petechial hemorrhage 10/14/2020   Acute stroke due to ischemia ([place]) 10/14/2020   Stroke ([place]) 10/06/2020   Essential hypertension 10/06/2020   Mixed hyperlipidemia 10/06/2020   PCP:  Pcp, No Pharmacy:   [facility] [address] [place] [address] Phone: [phone] Fax: [phone]     Social Determinants of Health (SDOH) Interventions    Readmission Risk Interventions No flowsheet data found.

## 2020-11-04 NOTE — TOC Progression Note (Signed)
Transition of Care [facility]) - Progression Note    Patient Details  Name: KAYDN SHIM MRN: [id number] Date of Birth: [birth year]  Transition of Care [facility]) CM/SW [place], [place] Phone Number: 11/04/2020, 3:08 PM  Clinical Narrative:   CSW following for SNF placement. Patient had participatory session with PT and OT today, so CSW completed referral packet and sent to [facility] for SNF placement request. CSW also sent a request to financial counseling to check on patient's Medicare, awaiting response. CSW to follow.    Expected Discharge Plan: Skilled Nursing Facility Barriers to Discharge: [place] (PASRR), Continued Medical Work up, Ship broker, Inadequate or no Biomedical scientist Expected Discharge Plan: [place] Choice: [place] arrangements for the past 2 months: Single Family Home                                       Social Determinants of Health (SDOH) Interventions    Readmission Risk Interventions No flowsheet data found.

## 2020-11-04 NOTE — TOC Progression Note (Signed)
Transition of Care [facility]) - Progression Note    Patient Details  Name: CORGAN MORMILE MRN: [id number] Date of Birth: November 25, 1950  Transition of Care [facility]) CM/SW [place], [place] Phone Number: 11/04/2020, 3:10 PM  Clinical Narrative:   CSW received patient's Medicare number via financial counseling earlier today ([id number]). Patient only has Medicare A. CSW met with patient's brother at bedside to provide information to him and discuss update. CSW to fax out referral to local facilities now that we have Medicare information, to see if anyone can bill Medicare primary with VA secondary. Brother going to [place] to discuss placement, as well. CSW received a call from the [place] that they had reviewed the patient's information and he is not eligible for coverage since he has Medicare. CSW contacted SNF referrals to ask about coverage with using VA as secondary, and was informed by SNF admissions liaisons at [facility] and [facility] that the [place] does not work as a Consulting civil engineer, so the patient would have to private pay whatever the Medicare A doesn't cover. Patient will not be able to do that. CSW then received a call from [place], Education officer, museum at the [place], that she met with the patient's brother and the brother discussed how he only has Part A. CSW reiterated that and that the part A will not fully cover SNF. Since patient doesn't have full SNF coverage with his Medicare, the VA should provide a contract. [place] to reach back out to the [place] team to discuss. CSW to follow.    Expected Discharge Plan: Skilled Nursing Facility Barriers to Discharge: [place] (PASRR), Continued Medical Work up, Ship broker, Inadequate or no Biomedical scientist Expected Discharge Plan: [place] Choice: [place] arrangements for the past 2 months: Single Family Home                                        Social Determinants of Health (SDOH) Interventions    Readmission Risk Interventions No flowsheet data found.

## 2020-11-05 ENCOUNTER — Inpatient Hospital Stay (HOSPITAL_COMMUNITY): Payer: No Typology Code available for payment source

## 2020-11-05 DIAGNOSIS — R531 Weakness: Secondary | ICD-10-CM | POA: Diagnosis not present

## 2020-11-05 LAB — CBC
HCT: 43.1 % (ref 39.0–52.0)
Hemoglobin: 13.1 g/dL (ref 13.0–17.0)
MCH: 25.9 pg — ABNORMAL LOW (ref 26.0–34.0)
MCHC: 30.4 g/dL (ref 30.0–36.0)
MCV: 85.2 fL (ref 80.0–100.0)
Platelets: 134 10*3/uL — ABNORMAL LOW (ref 150–400)
RBC: 5.06 MIL/uL (ref 4.22–5.81)
RDW: 15.8 % — ABNORMAL HIGH (ref 11.5–15.5)
WBC: 7.5 10*3/uL (ref 4.0–10.5)
nRBC: 0 % (ref 0.0–0.2)

## 2020-11-05 LAB — BASIC METABOLIC PANEL
Anion gap: 8 (ref 5–15)
BUN: 54 mg/dL — ABNORMAL HIGH (ref 8–23)
CO2: 19 mmol/L — ABNORMAL LOW (ref 22–32)
Calcium: 9.3 mg/dL (ref 8.9–10.3)
Chloride: 123 mmol/L — ABNORMAL HIGH (ref 98–111)
Creatinine, Ser: 1.46 mg/dL — ABNORMAL HIGH (ref 0.61–1.24)
GFR, Estimated: 51 mL/min — ABNORMAL LOW (ref >60)
Glucose, Bld: 147 mg/dL — ABNORMAL HIGH (ref 70–99)
Potassium: 3.9 mmol/L (ref 3.5–5.1)
Sodium: 150 mmol/L — ABNORMAL HIGH (ref 135–145)

## 2020-11-05 LAB — GLUCOSE, CAPILLARY
Glucose-Capillary: 140 mg/dL — ABNORMAL HIGH (ref 70–99)
Glucose-Capillary: 151 mg/dL — ABNORMAL HIGH (ref 70–99)
Glucose-Capillary: 134 mg/dL — ABNORMAL HIGH (ref 70–99)
Glucose-Capillary: 133 mg/dL — ABNORMAL HIGH (ref 70–99)
Glucose-Capillary: 136 mg/dL — ABNORMAL HIGH (ref 70–99)
Glucose-Capillary: 133 mg/dL — ABNORMAL HIGH (ref 70–99)

## 2020-11-05 MED ORDER — LEVETIRACETAM 100 MG/ML PO SOLN
250.0000 mg | Freq: Two times a day (BID) | ORAL | Status: DC
  Administered 2020-11-05 – 2020-12-14 (×78): 250 mg
  Filled 2020-11-05 (×79): qty 5

## 2020-11-05 NOTE — Plan of Care (Signed)

## 2020-11-05 NOTE — Progress Notes (Signed)
PROGRESS NOTE  Clifford [name] [id number] DOB: 05-Apr-1950 DOA: 10/14/2020 PCP: Pcp, No   LOS: 22 days   Brief Narrative / Interim history: Clifford [name] is a 70 y.o. 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   Had an initial stroke 8/3, was admitted 2 days, then discharged with [facility].  After about 4 days at home, he developed worsening of his stroke symptoms, returned to the hospital. In the ER, found to have enlargement of his previous stroke with micro-hemorrhage.  Initially had relatively mild deficits still, NIHSS 4, so his Pradaxa was held and he was admitted for SNF placement for short term rehab.  Here, however, he developed progressively worsening left hemiparesis, then somnolence (see NIHSS chart in progress note 8/24).  Neurology were reconsulted.  Serial head CTs on 8/16 showed expected evolution/progression of his stroke, further edema, but no herniation or substantial midline shift.  Neurology followed the patient and recommended further supportive care and SNF placement.  An NG tube was placed as the patient lost the ability to swallow.  Palliative Care were consulted and in detailed family discussions, with prognosis provided by Neurology (more likely able to regain ability to talk and participate in self cares, unlikely to regain ability to walk, uncertain if able to regain ability to swallow, very unlikely to improve to full independence), it was decided to place PEG and pursue SNF rehab, PEG was placed 8/30.  Subjective / 24h Interval events: Overnight nurse reports 1 minute of sinus tachycardia into the 140s but patient asymptomatic  Keeps his eyes closed for me this morning but tries to open them on command, is able to follow commands, tries to speak but is unable to do so  Assessment & Plan: Principal Problem Increased petechial  hemorrhage/evolution of previous right MCA/PCA territory infarct with mild edema with now increasing midline shift on repeat MRI 10/28/2020 -MRI mild enlargement with cytotoxic edema and petechial hemorrhage with increased midline shift compared to 10/07/20. -Neurology suspect his current poor functional status is mostly due to swelling, with a component of this from free water and a component of this from bleeding.  They feel this is within the range of expected, and that it is likely to improve, with prognosis outlined above.   -He remains with significant dysphagia, placed by IR 8/30. -Patient still complaining of headache, he will be started on Topamax as discussed with neurology Dr. Leonie Man -Repeat CT head 8/30 showing progressive CVA, midline shift is 10 mm(as opposed to 14 mm on MRI 8/25) -Continue atorvastatin -Due to sinus tach unclear cause, I did a repeat CT scan of the head 9/2 which showed stable CVA, and midline shift is now 6 mm  Active Problems Hypertension -Continue amlodipine and hydralazine   Suspected focal seizures Presented with left hand twitching which was nonsuppressible and clinically suspicious for stroke, and was started on Keppra.   -Keppra reduced 8/24 to eliminate sedating side effects from [place] from limiting his rehab -We will reduce Keppra today to promote wakefulness, may need Provigil but will discuss with neurology  Hypernatremia-due to decreased free water, monitor, overall stable  CKD IIIa -Baseline Cr 1.2-1.4 - BMP daily for now   Dysphagia -Status post PEG tube 8/30    History of recurrent VTE -continue Eliquis   Lung nodule Incidental finding of 43m LUL nodule. Patient former smoker. -Recommend follow up  CT in 6-12 months depending on neurological progression   Possible sclerotic vertebra on imaging Incidental finding.  PSA 5, unlikely this sclerosis is from metastatic prostate cancer with that level.   Chronic diastolic CHF  Appears  euvolemic.  Not on diuretic at baseline   Hyperkalemia Resolved with Lokelma   Right eye pain -With some scleral injection, discussed with ophthalmology dr Posey Pronto , likely due to abrasion from stroke and it remains open,  Scheduled Meds:  amLODipine  10 mg Per Tube Daily   apixaban  5 mg Per Tube BID   atorvastatin  40 mg Per Tube Daily   erythromycin   Right Eye TID   feeding supplement (PROSource TF)  45 mL Per Tube BID   hydrALAZINE  25 mg Per Tube QID   insulin aspart  0-9 Units Subcutaneous Q4H   levETIRAcetam  500 mg Per Tube BID   mouth rinse  15 mL Mouth Rinse BID   pantoprazole sodium  40 mg Per Tube Daily   topiramate  50 mg Per Tube BID   Continuous Infusions:  feeding supplement (JEVITY 1.5 CAL/FIBER) 1,000 mL (11/05/20 1044)   PRN Meds:.acetaminophen **OR** acetaminophen (TYLENOL) oral liquid 160 mg/5 mL **OR** acetaminophen, labetalol, ondansetron (ZOFRAN) IV, polyethylene glycol  Diet Orders (From admission, onward)     Start     Ordered   11/04/20 1120  Diet NPO time specified  Diet effective now        11/04/20 1119            DVT prophylaxis:  apixaban (ELIQUIS) tablet 5 mg     Code Status: DNR  Family Communication: Brother at bedside  Status is: Inpatient  Remains inpatient appropriate because:IV treatments appropriate due to intensity of illness or inability to take PO  Dispo: The patient is from: Home              Anticipated d/c is to: SNF              Patient currently is not medically stable to d/c.   Difficult to place patient No   Level of care: Telemetry Medical  Consultants:  Neurology   Procedures:  none  Microbiology  none  Antimicrobials: none    Objective: Vitals:   11/04/20 1955 11/04/20 2351 11/05/20 0401 11/05/20 0819  BP: (!) 157/84 (!) 143/87 (!) 153/90 (!) 168/83  Pulse: 85 94 98 100  Resp: '18 17 18 18  '$ Temp: 97.6 F (36.4 C) 98.6 F (37 C) 97.6 F (36.4 C) 97.6 F (36.4 C)  TempSrc: Oral Oral Oral  Oral  SpO2: 100% 100% 100% 100%  Weight:      Height:        Intake/Output Summary (Last 24 hours) at 11/05/2020 1200 Last data filed at 11/05/2020 [id number] Gross per 24 hour  Intake 0 ml  Output 1450 ml  Net -1450 ml    Filed Weights   10/28/20 0454 10/29/20 0403 11/01/20 0405  Weight: 87.4 kg 87.5 kg 87.3 kg    Examination:  Constitutional: No distress, follows commands but keeps his eyes closed Eyes: Anicteric ENMT: mmm Neck: normal, supple Respiratory: Clear to auscultation without wheezing or crackles Cardiovascular: Regular rate and rhythm, no murmurs, no edema, Abdomen: Soft, NT, ND, bowel sounds positive Musculoskeletal: no clubbing / cyanosis.  Skin: No rashes seen Neurologic: left sided weakness, good strength on right, follows commands  Data Reviewed: I have independently reviewed following labs and imaging studies   CBC: Recent Labs  Lab 10/31/20 0047 11/01/20 0123 11/02/20 0157 11/03/20 0356 11/05/20 0241  WBC 7.6 7.0 11.7* 9.2 7.5  HGB 12.1* 12.4* 13.1 12.9* 13.1  HCT 40.2 40.2 41.8 41.3 43.1  MCV 84.6 83.8 82.4 83.6 85.2  PLT 144* 149* 154 134* 134*    Basic Metabolic Panel: Recent Labs  Lab 11/01/20 0123 11/02/20 0157 11/03/20 0356 11/04/20 0850 11/05/20 0241  NA 144 146* 149* 151* 150*  K 4.2 4.5 4.4 4.3 3.9  CL 113* 114* 117* 123* 123*  CO2 '23 24 25 '$ 21* 19*  GLUCOSE 134* 116* 115* 133* 147*  BUN 54* 56* 62* 55* 54*  CREATININE 1.22 1.32* 1.56* 1.46* 1.46*  CALCIUM 9.3 9.8 9.8 9.3 9.3    Liver Function Tests: No results for input(s): AST, ALT, ALKPHOS, BILITOT, PROT, ALBUMIN in the last 168 hours. Coagulation Profile: Recent Labs  Lab 11/02/20 0157  INR 1.2    HbA1C: No results for input(s): HGBA1C in the last 72 hours. CBG: Recent Labs  Lab 11/04/20 2032 11/04/20 2347 11/05/20 0430 11/05/20 0631 11/05/20 0820  GLUCAP 124* 143* 140* 151* 134*     Recent Results (from the past 240 hour(s))  Surgical pcr screen      Status: None   Collection Time: 11/02/20  1:30 AM   Specimen: Nasal Mucosa; Nasal Swab  Result Value Ref Range Status   MRSA, PCR NEGATIVE NEGATIVE Final   Staphylococcus aureus NEGATIVE NEGATIVE Final    Comment: (NOTE) The Xpert SA Assay (FDA approved for NASAL specimens in patients 80 years of age and older), is one component of a comprehensive surveillance program. It is not intended to diagnose infection nor to guide or monitor treatment. Performed at [facility], [place] [address], [place], [place] [address]       Radiology Studies: CT HEAD WO CONTRAST (5MM)  Result Date: 11/05/2020 CLINICAL DATA:  Change in mental status with unknown cause EXAM: CT HEAD WITHOUT CONTRAST TECHNIQUE: Contiguous axial images were obtained from the base of the skull through the vertex without intravenous contrast. COMPARISON:  11/01/2020 FINDINGS: Brain: Large posterior division right MCA territory infarction with petechial hemorrhage. Milder involvement in the anterior right frontal lobe. Cytotoxic edema causes midline shift 6 mm, improved. No entrapment. Vascular: Atheromatous calcification.  No acute vessel hyperdensity. Skull: Negative Sinuses/Orbits: Negative IMPRESSION: Large subacute infarct in the posterior division right MCA territory with petechial hemorrhage and midline shift. No progression when compared to prior. Electronically Signed   By: Monte Fantasia M.D.   On: 11/05/2020 11:25     Marzetta Board, MD, PhD [facility]  Between 7 am - 7 pm I am available, please contact me via Amion (for emergencies) or Securechat (non urgent messages)  Between 7 pm - 7 am I am not available, please contact night coverage MD/APP via Amion

## 2020-11-05 NOTE — Progress Notes (Signed)
Physical Therapy Treatment Patient Details Name: Clifford [name] MRN: [id number] DOB: [birth year] Today's Date: 11/05/2020    History of Present Illness 70 yo male presented to ED on 8/11 for R sided headache and worsening L UE weakness. Patient recently admitted 8/3-8/5 for R CVA with L visual deficits and L sided weakness. MRI with mild enlargement of R MCA/border zone infarct since 10/07/20 MRI and increased cytotoxic edema and petechial hemorrhage without midline shift. Palliative Care consulted and in detailed family discussions, with prognosis provided by Neurology (more likely able to regain ability to talk and participate in self cares, unlikely to regain ability to walk, uncertain if able to regain ability to swallow, very unlikely to improve to full independence), it was decided to place PEG and pursue SNF rehab, PEG was placed 8/30. PMH: HTN, HLD, hx of PE on coumadin, CVA 10/2020    PT Comments    Pt received in supine, able to open eyes to command and responding with nods/head shakes appropriately to questions and participatory. Pt needing max to totalA to roll for cleanup (incontinent during session, increased assist to roll to R side) but with decreased alertness after rolling and limited supine exercises and falling asleep ~3 minutes after sitting upright, so needed totalA +2 to return safely to supine. Deferred standing/chair transfers today due to decreased alertness. Pt continues to benefit from PT services to progress toward functional mobility goals.    Follow Up Recommendations  SNF;Supervision/Assistance - 24 hour     Equipment Recommendations  Other (comment) (TBA; currently could benefit from specialized wheelchair eval, mechanical lift, and hospital bed)    Recommendations for Other Services       Precautions / Restrictions Precautions Precautions: Fall Precaution Comments: L visual field deficit, R gaze preference, L inattention, L hemparesis; G-tube and abdominal binder,  pt family member present so deferred R hand mitt Restrictions Weight Bearing Restrictions: No    Mobility  Bed Mobility Overal bed mobility: Needs Assistance Bed Mobility: Rolling;Sidelying to Sit;Sit to Sidelying Rolling: Max assist;Total assist;+2 for physical assistance Sidelying to sit: +2 for physical assistance;Max assist;HOB elevated;Total assist     Sit to sidelying: +2 for physical assistance;Total assist General bed mobility comments: pt not grimacing with mobility but remains lethargic throughout, when rolled to L side while donning abdominal binder, pt able to maintain R hand on bed rail but unable to assist to pull trunk over; when knees placed in bent position, pt able to maintain bent legs; once upright, initially using RUE to assist with balance but quickly began falling asleep and then needs max to totalA to remain upright    Transfers                 General transfer comment: UTA; pt falling asleep seated EOB  Ambulation/Gait                 Stairs             Wheelchair Mobility    Modified Rankin (Stroke Patients Only)       Balance Overall balance assessment: Needs assistance Sitting-balance support: Single extremity supported;Feet supported Sitting balance-Leahy Scale:  (Initially poor progressed to zero due to lethargy) Sitting balance - Comments: able to use RUE to assist with balance with mod/maxA initially but after ~3 mins pt falling asleep/starting to snore so had to be assisted back to supine with totalA +2 Postural control: Posterior lean;Left lateral lean     Standing balance comment: pt unable today;  too lethargic                            Cognition Arousal/Alertness: Lethargic Behavior During Therapy: Flat affect Overall Cognitive Status: Difficult to assess Area of Impairment: Attention;Safety/judgement;Awareness;Problem solving                   Current Attention Level: Focused   Following  Commands: Follows one step commands inconsistently;Follows one step commands with increased time Safety/Judgement: Decreased awareness of deficits;Decreased awareness of safety   Problem Solving: Slow processing;Requires verbal cues;Requires tactile cues;Decreased initiation;Difficulty sequencing General Comments: did nod his head yes/shake head no appropriately at times, opened eyes briefly to command and assisting to roll to L and some righting reactions initially with sitting but while seated became more fatigued and starting to fall asleep so unable to safely assess standing      Exercises Other Exercises Other Exercises: seated RLE AAROM: hip flexion, LAQ x10 reps ea Other Exercises: sidelying RUE ROM: AA to push/pull on bed rail while in sidelying x5 reps Other Exercises: supine RLE AAROM: heel slides x 5 reps Other Exercises: RUE gross grasp x 5 reps    General Comments General comments (skin integrity, edema, etc.): pt brother Marland Kitchen in room for first few mins then left and re-entered at end of session; he states the beginning of session was most he had been alert all day. VSS on RA. Pt had been tapping with RUE on bed while awake/seated but sleeping at end of session      Pertinent Vitals/Pain Pain Assessment: Faces Faces Pain Scale: No hurt     PT Goals (current goals can now be found in the care plan section) Acute Rehab PT Goals Patient Stated Goal: none stated PT Goal Formulation: With patient Time For Goal Achievement: 11/10/20 Potential to Achieve Goals: Poor Progress towards PT goals: Not progressing toward goals - comment (too lethargic to progress mobility)    Frequency    Min 3X/week      PT Plan Current plan remains appropriate       AM-PAC PT "6 Clicks" Mobility   Outcome Measure  Help needed turning from your back to your side while in a flat bed without using bedrails?: Total Help needed moving from lying on your back to sitting on the side of a  flat bed without using bedrails?: Total Help needed moving to and from a bed to a chair (including a wheelchair)?: Total Help needed standing up from a chair using your arms (e.g., wheelchair or bedside chair)?: Total Help needed to walk in hospital room?: Total Help needed climbing 3-5 steps with a railing? : Total 6 Click Score: 6    End of Session Equipment Utilized During Treatment: Other (comment) (abdominal binder donned throughout) Activity Tolerance: Patient limited by lethargy Patient left: in bed;with call [employer]/phone within reach;with bed alarm set;Other (comment);with SCD's reapplied;with family/visitor present (pt brother present in room so left mitt off R hand, told to notify RN when leaving so staff can ensure pt not pulling on lines/leads; pt asleep/not restless at that time) Nurse Communication: Mobility status;Other (comment) (one of his SCDs (R) appears not to be functioning) PT Visit Diagnosis: Unsteadiness on feet (R26.81);Muscle weakness (generalized) (M62.81);Other symptoms and signs involving the nervous system (R29.898);Other abnormalities of gait and mobility (R26.89)     Time: [id number] PT Time Calculation (min) (ACUTE ONLY): 32 min  Charges:  $Therapeutic Exercise: 8-22 mins $Therapeutic Activity: 8-22  mins                     Houston Siren., PTA Acute Rehabilitation Services Pager: [phone] Office: [place] 11/05/2020, 5:56 PM

## 2020-11-06 DIAGNOSIS — R531 Weakness: Secondary | ICD-10-CM | POA: Diagnosis not present

## 2020-11-06 LAB — BASIC METABOLIC PANEL
Anion gap: 6 (ref 5–15)
BUN: 61 mg/dL — ABNORMAL HIGH (ref 8–23)
CO2: 22 mmol/L (ref 22–32)
Calcium: 9.3 mg/dL (ref 8.9–10.3)
Chloride: 124 mmol/L — ABNORMAL HIGH (ref 98–111)
Creatinine, Ser: 1.68 mg/dL — ABNORMAL HIGH (ref 0.61–1.24)
GFR, Estimated: 43 mL/min — ABNORMAL LOW (ref >60)
Glucose, Bld: 135 mg/dL — ABNORMAL HIGH (ref 70–99)
Potassium: 4 mmol/L (ref 3.5–5.1)
Sodium: 152 mmol/L — ABNORMAL HIGH (ref 135–145)

## 2020-11-06 LAB — CBC
HCT: 42.8 % (ref 39.0–52.0)
Hemoglobin: 13.2 g/dL (ref 13.0–17.0)
MCH: 26.3 pg (ref 26.0–34.0)
MCHC: 30.8 g/dL (ref 30.0–36.0)
MCV: 85.4 fL (ref 80.0–100.0)
Platelets: 141 10*3/uL — ABNORMAL LOW (ref 150–400)
RBC: 5.01 MIL/uL (ref 4.22–5.81)
RDW: 15.7 % — ABNORMAL HIGH (ref 11.5–15.5)
WBC: 7.6 10*3/uL (ref 4.0–10.5)
nRBC: 0 % (ref 0.0–0.2)

## 2020-11-06 LAB — GLUCOSE, CAPILLARY
Glucose-Capillary: 101 mg/dL — ABNORMAL HIGH (ref 70–99)
Glucose-Capillary: 117 mg/dL — ABNORMAL HIGH (ref 70–99)
Glucose-Capillary: 136 mg/dL — ABNORMAL HIGH (ref 70–99)
Glucose-Capillary: 141 mg/dL — ABNORMAL HIGH (ref 70–99)
Glucose-Capillary: 145 mg/dL — ABNORMAL HIGH (ref 70–99)
Glucose-Capillary: 149 mg/dL — ABNORMAL HIGH (ref 70–99)

## 2020-11-06 MED ORDER — MODAFINIL 100 MG PO TABS
100.0000 mg | ORAL_TABLET | Freq: Every day | ORAL | Status: DC
  Filled 2020-11-06: qty 1

## 2020-11-06 MED ORDER — MODAFINIL 100 MG PO TABS
100.0000 mg | ORAL_TABLET | Freq: Every day | ORAL | Status: DC
  Administered 2020-11-06 – 2020-12-14 (×39): 100 mg
  Filled 2020-11-06 (×38): qty 1

## 2020-11-06 NOTE — Progress Notes (Signed)
Physical Therapy Treatment Patient Details Name: Clifford [name] MRN: [id number] DOB: [birth year] Today's Date: 11/06/2020    History of Present Illness 70 yo male presented to ED on 8/11 for R sided headache and worsening L UE weakness. Patient recently admitted 8/3-8/5 for R CVA with L visual deficits and L sided weakness. MRI with mild enlargement of R MCA/border zone infarct since 10/07/20 MRI and increased cytotoxic edema and petechial hemorrhage without midline shift. Palliative Care consulted and in detailed family discussions, with prognosis provided by Neurology (more likely able to regain ability to talk and participate in self cares, unlikely to regain ability to walk, uncertain if able to regain ability to swallow, very unlikely to improve to full independence), it was decided to place PEG and pursue SNF rehab, PEG was placed 8/30. 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    PT Comments    Pt received in supine, drowsy initially but with improving alertness during session and able to maintain eyes open for ~20 minutes, and nods head yes/no appropriately in response to questions today. Per RN, pt had modafinil at start of session which should help alertness. Emphasis on rolling/bed mobility, seated balance, RUE/LE exercises for strengthening and importance of repositioning for pressure relief. Pt remains aphasic but more attentive and following 50-60% of 1-step commands this date. Pt continues to benefit from PT services to progress toward functional mobility goals.   Follow Up Recommendations  SNF;Supervision/Assistance - 24 hour     Equipment Recommendations  Other (comment) (TBA; currently could benefit from specialized wheelchair eval, mechanical lift, and hospital bed)    Recommendations for Other Services       Precautions / Restrictions Precautions Precautions: Fall Precaution Comments: L visual field deficit, R gaze preference, L inattention, L hemparesis; G-tube and  abdominal binder Required Braces or Orthoses:  (abdominal binder) Restrictions Weight Bearing Restrictions: No    Mobility  Bed Mobility Overal bed mobility: Needs Assistance Bed Mobility: Rolling;Sidelying to Sit;Sit to Sidelying Rolling: Max assist;Total assist Sidelying to sit: Max assist;HOB elevated     Sit to sidelying: +2 for physical assistance;Max assist General bed mobility comments: pt able to assist with supine>sit transfer with max cues for RUE support but demos posterior lean throughout seated trial ~5 mins    Transfers                 General transfer comment: poor seated balance today unsafe to assess  Ambulation/Gait                 Stairs             Wheelchair Mobility    Modified Rankin (Stroke Patients Only) Modified Rankin (Stroke Patients Only) Pre-Morbid Rankin Score: Moderately severe disability Modified Rankin: Severe disability     Balance Overall balance assessment: Needs assistance Sitting-balance support: Single extremity supported;Feet supported Sitting balance-Leahy Scale: Zero Sitting balance - Comments: able to use RUE to assist with balance with mod/maxA but progressed to totalA due to pt fatigue vs distraction; +2 assist needed to guide pt back into bed Postural control: Posterior lean     Standing balance comment: pt unable today, poor seated balance                            Cognition Arousal/Alertness:  (initially lethargic but increasingly alert during session) Behavior During Therapy: Restless (varying lethargy/alertness, increased RUE restlessness) Overall Cognitive Status: Difficult to assess  Area of Impairment: Attention;Safety/judgement;Awareness;Problem solving                   Current Attention Level: Focused Memory: Decreased recall of precautions;Decreased short-term memory Following Commands: Follows one step commands inconsistently;Follows one step commands with increased  time Safety/Judgement: Decreased awareness of deficits;Decreased awareness of safety Awareness: Intellectual Problem Solving: Slow processing;Requires verbal cues;Requires tactile cues;Decreased initiation;Difficulty sequencing General Comments: pt able to open eyes and assist with wiping face with a washcloth, pt smiles to command (continues to demo L facial droop); pt nods head yes/no appropriately to questions today; pt with increasingly restless RUE movements, asked unit secretary to order him an activity mat for his bed.      Exercises Other Exercises Other Exercises: supine RLE A/AAROM: ankle pumps, heel slides, hip abduction, SAQ, SLR, stabilized bridging x5-10 reps ea Other Exercises: RUE gross grasp x 10 reps    General Comments General comments (skin integrity, edema, etc.): BP 125/80 supine; HR WNL and SpO2 WNL; pt more restless with RUE this date      Pertinent Vitals/Pain Pain Assessment: No/denies pain Faces Pain Scale: No hurt Pain Intervention(s): Monitored during session;Repositioned;Other (comment) (shakes head "no" when asked if he has a headache)    Home Living                      Prior Function            PT Goals (current goals can now be found in the care plan section) Acute Rehab PT Goals Patient Stated Goal: pt nodding his head yes to wanting to sit EOB PT Goal Formulation: With patient Time For Goal Achievement: 11/10/20 Potential to Achieve Goals: Poor Progress towards PT goals: Progressing toward goals (slow progress but more interactive today)    Frequency    Min 3X/week      PT Plan Current plan remains appropriate    Co-evaluation              AM-PAC PT "6 Clicks" Mobility   Outcome Measure  Help needed turning from your back to your side while in a flat bed without using bedrails?: A Lot Help needed moving from lying on your back to sitting on the side of a flat bed without using bedrails?: A Lot Help needed moving to  and from a bed to a chair (including a wheelchair)?: Total Help needed standing up from a chair using your arms (e.g., wheelchair or bedside chair)?: Total Help needed to walk in hospital room?: Total Help needed climbing 3-5 steps with a railing? : Total 6 Click Score: 8    End of Session Equipment Utilized During Treatment: Other (comment) (abdominal binder donned throughout) Activity Tolerance: Patient tolerated treatment well Patient left: in bed;with call [place]/phone within reach;with bed alarm set;with SCD's reapplied;Other (comment) (RN notified pt pulling on urinary catheter may need to consider donning R hand mitt due to restlessness) Nurse Communication: Mobility status;Other (comment) (one of his SCDs (R) appears not to be functioning) PT Visit Diagnosis: Unsteadiness on feet (R26.81);Muscle weakness (generalized) (M62.81);Other symptoms and signs involving the nervous system (R29.898);Other abnormalities of gait and mobility (R26.89)     Time: [id number] PT Time Calculation (min) (ACUTE ONLY): 34 min  Charges:  $Therapeutic Exercise: 8-22 mins $Therapeutic Activity: 8-22 mins                     Creasie Lacosse P., PTA Acute Rehabilitation Services Pager: [phone] Office: [phone]  [place] 11/06/2020, 5:43 PM

## 2020-11-06 NOTE — Plan of Care (Signed)
  Problem: Education: Goal: Knowledge of disease or condition will improve Outcome: Progressing Goal: Knowledge of secondary prevention will improve Outcome: Progressing Goal: Knowledge of patient specific risk factors addressed and post discharge goals established will improve Outcome: Progressing Goal: Individualized Educational Video(s) Outcome: Progressing   Problem: Nutrition: Goal: Risk of aspiration will decrease Outcome: Progressing Goal: Dietary intake will improve Outcome: Progressing   Problem: Intracerebral Hemorrhage Tissue Perfusion: Goal: Complications of Intracerebral Hemorrhage will be minimized Outcome: Progressing   Problem: Ischemic Stroke/TIA Tissue Perfusion: Goal: Complications of ischemic stroke/TIA will be minimized Outcome: Progressing

## 2020-11-06 NOTE — Progress Notes (Signed)
PROGRESS NOTE  Clifford [name] [id number] DOB: February 17, 1951 DOA: 10/14/2020 PCP: Pcp, No   LOS: 23 days   Brief Narrative / Interim history: Clifford [name] is a 70 y.o. 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   Had an initial stroke 8/3, was admitted 2 days, then discharged with [name].  After about 4 days at home, he developed worsening of his stroke symptoms, returned to the hospital. In the ER, found to have enlargement of his previous stroke with micro-hemorrhage.  Initially had relatively mild deficits still, NIHSS 4, so his Pradaxa was held and he was admitted for SNF placement for short term rehab.  Here, however, he developed progressively worsening left hemiparesis, then somnolence (see NIHSS chart in progress note 8/24).  Neurology were reconsulted.  Serial head CTs on 8/16 showed expected evolution/progression of his stroke, further edema, but no herniation or substantial midline shift.  Neurology followed the patient and recommended further supportive care and SNF placement.  An NG tube was placed as the patient lost the ability to swallow.  Palliative Care were consulted and in detailed family discussions, with prognosis provided by Neurology (more likely able to regain ability to talk and participate in self cares, unlikely to regain ability to walk, uncertain if able to regain ability to swallow, very unlikely to improve to full independence), it was decided to place PEG and pursue SNF rehab, PEG was placed 8/30.  Subjective / 24h Interval events: He is opening his eyes for me this morning but cannot talk.  Understands was going on and he is following commands  Assessment & Plan: Principal Problem Increased petechial hemorrhage/evolution of previous right MCA/PCA territory infarct with mild edema with now increasing midline shift on repeat  MRI 10/28/2020 -MRI mild enlargement with cytotoxic edema and petechial hemorrhage with increased midline shift compared to 10/07/20. -Neurology suspect his current poor functional status is mostly due to swelling, with a component of this from free water and a component of this from bleeding.  They feel this is within the range of expected, and that it is likely to improve, with prognosis outlined above.   -He remains with significant dysphagia, placed by IR 8/30. -Patient still complaining of headache, he will be started on Topamax as discussed with neurology Dr. Leonie Man -Repeat CT head 8/30 showing progressive CVA, midline shift is 10 mm(as opposed to 14 mm on MRI 8/25) -Continue atorvastatin -Due to sinus tach unclear cause, I did a repeat CT scan of the head 9/2 which showed stable CVA, and midline shift is now 6 mm -Neurologic status remains poor but overall stable   Active Problems Hypertension -Continue amlodipine and hydralazine   Suspected focal seizures Presented with left hand twitching which was nonsuppressible and clinically suspicious for stroke, and was started on Keppra.   -Keppra reduced 8/24 to eliminate sedating side effects from [name] from limiting his rehab -We will reduce Keppra today to promote wakefulness, may need Provigil but will discuss with neurology  Hypernatremia-due to decreased free water, monitor, overall stable  CKD IIIa -Baseline Cr 1.2-1.4 - BMP daily for now   Dysphagia -Status post PEG tube 8/30    History of recurrent VTE -continue Eliquis   Lung nodule Incidental finding of 18m LUL nodule. Patient former smoker. -Recommend follow up CT in 6-12 months depending on neurological progression   Possible sclerotic vertebra on  imaging Incidental finding.  PSA 5, unlikely this sclerosis is from metastatic prostate cancer with that level.   Chronic diastolic CHF  Appears euvolemic.  Not on diuretic at baseline   Hyperkalemia Resolved with  Lokelma   Right eye pain -With some scleral injection, discussed with ophthalmology dr Posey Pronto , likely due to abrasion from stroke and it remains open,  Scheduled Meds:  amLODipine  10 mg Per Tube Daily   apixaban  5 mg Per Tube BID   atorvastatin  40 mg Per Tube Daily   erythromycin   Right Eye TID   feeding supplement (PROSource TF)  45 mL Per Tube BID   hydrALAZINE  25 mg Per Tube QID   insulin aspart  0-9 Units Subcutaneous Q4H   levETIRAcetam  250 mg Per Tube BID   mouth rinse  15 mL Mouth Rinse BID   pantoprazole sodium  40 mg Per Tube Daily   topiramate  50 mg Per Tube BID   Continuous Infusions:  feeding supplement (JEVITY 1.5 CAL/FIBER) 1,000 mL (11/06/20 0338)   PRN Meds:.acetaminophen **OR** acetaminophen (TYLENOL) oral liquid 160 mg/5 mL **OR** acetaminophen, labetalol, ondansetron (ZOFRAN) IV, polyethylene glycol  Diet Orders (From admission, onward)     Start     Ordered   11/04/20 1120  Diet NPO time specified  Diet effective now        11/04/20 1119            DVT prophylaxis:  apixaban (ELIQUIS) tablet 5 mg     Code Status: DNR  Family Communication: Brother at bedside  Status is: Inpatient  Remains inpatient appropriate because:IV treatments appropriate due to intensity of illness or inability to take PO  Dispo: The patient is from: Home              Anticipated d/c is to: SNF              Patient currently is not medically stable to d/c.   Difficult to place patient No   Level of care: Telemetry Medical  Consultants:  Neurology   Procedures:  none  Microbiology  none  Antimicrobials: none    Objective: Vitals:   11/05/20 2339 11/06/20 0332 11/06/20 0824 11/06/20 1124  BP: 132/83 (!) 141/95 121/76 115/82  Pulse: 96 87 73 78  Resp: '18 18 18 18  '$ Temp: 98 F (36.7 C) 98.1 F (36.7 C) 98.6 F (37 C) 98.3 F (36.8 C)  TempSrc: Oral Oral    SpO2: 100% 100% 100% 100%  Weight:  81.9 kg    Height:        Intake/Output Summary  (Last 24 hours) at 11/06/2020 1421 Last data filed at 11/06/2020 [id number] Gross per 24 hour  Intake 3339 ml  Output 600 ml  Net 2739 ml    Filed Weights   10/29/20 0403 11/01/20 0405 11/06/20 0332  Weight: 87.5 kg 87.3 kg 81.9 kg    Examination:  Constitutional: No apparent distress Eyes: Anicteric ENMT: mmm Neck: normal, supple Respiratory: Clear bilaterally, no wheezing, no crackles Cardiovascular: Regular rate and rhythm, no edema Abdomen: Soft, nontender, nondistended, bowel sounds positive Musculoskeletal: no clubbing / cyanosis.  Skin: No rashes seen Neurologic: left sided weakness, remains stronger than right, follows commands  Data Reviewed: I have independently reviewed following labs and imaging studies   CBC: Recent Labs  Lab 11/01/20 0123 11/02/20 0157 11/03/20 0356 11/05/20 0241 11/06/20 0314  WBC 7.0 11.7* 9.2 7.5 7.6  HGB 12.4* 13.1 12.9* 13.1  13.2  HCT 40.2 41.8 41.3 43.1 42.8  MCV 83.8 82.4 83.6 85.2 85.4  PLT 149* 154 134* 134* 141*    Basic Metabolic Panel: Recent Labs  Lab 11/02/20 0157 11/03/20 0356 11/04/20 0850 11/05/20 0241 11/06/20 0314  NA 146* 149* 151* 150* 152*  K 4.5 4.4 4.3 3.9 4.0  CL 114* 117* 123* 123* 124*  CO2 24 25 21* 19* 22  GLUCOSE 116* 115* 133* 147* 135*  BUN 56* 62* 55* 54* 61*  CREATININE 1.32* 1.56* 1.46* 1.46* 1.68*  CALCIUM 9.8 9.8 9.3 9.3 9.3    Liver Function Tests: No results for input(s): AST, ALT, ALKPHOS, BILITOT, PROT, ALBUMIN in the last 168 hours. Coagulation Profile: Recent Labs  Lab 11/02/20 0157  INR 1.2    HbA1C: No results for input(s): HGBA1C in the last 72 hours. CBG: Recent Labs  Lab 11/05/20 2013 11/06/20 0004 11/06/20 0349 11/06/20 0824 11/06/20 1124  GLUCAP 133* 141* 149* 117* 136*     Recent Results (from the past 240 hour(s))  Surgical pcr screen     Status: None   Collection Time: 11/02/20  1:30 AM   Specimen: Nasal Mucosa; Nasal Swab  Result Value Ref Range Status    MRSA, PCR NEGATIVE NEGATIVE Final   Staphylococcus aureus NEGATIVE NEGATIVE Final    Comment: (NOTE) The Xpert SA Assay (FDA approved for NASAL specimens in patients 93 years of age and older), is one component of a comprehensive surveillance program. It is not intended to diagnose infection nor to guide or monitor treatment. Performed at [facility], [place] [address] [place], [place] [address]       Radiology Studies: No results found.   Marzetta Board, MD, PhD [facility]  Between 7 am - 7 pm I am available, please contact me via Amion (for emergencies) or Securechat (non urgent messages)  Between 7 pm - 7 am I am not available, please contact night coverage MD/APP via Amion

## 2020-11-07 DIAGNOSIS — R531 Weakness: Secondary | ICD-10-CM | POA: Diagnosis not present

## 2020-11-07 LAB — CBC
HCT: 44.7 % (ref 39.0–52.0)
Hemoglobin: 13.2 g/dL (ref 13.0–17.0)
MCH: 25.4 pg — ABNORMAL LOW (ref 26.0–34.0)
MCHC: 29.5 g/dL — ABNORMAL LOW (ref 30.0–36.0)
MCV: 86.1 fL (ref 80.0–100.0)
Platelets: 121 10*3/uL — ABNORMAL LOW (ref 150–400)
RBC: 5.19 MIL/uL (ref 4.22–5.81)
RDW: 15.8 % — ABNORMAL HIGH (ref 11.5–15.5)
WBC: 8.4 10*3/uL (ref 4.0–10.5)
nRBC: 0 % (ref 0.0–0.2)

## 2020-11-07 LAB — BASIC METABOLIC PANEL
Anion gap: 6 (ref 5–15)
BUN: 62 mg/dL — ABNORMAL HIGH (ref 8–23)
CO2: 23 mmol/L (ref 22–32)
Calcium: 9.3 mg/dL (ref 8.9–10.3)
Chloride: 123 mmol/L — ABNORMAL HIGH (ref 98–111)
Creatinine, Ser: 1.61 mg/dL — ABNORMAL HIGH (ref 0.61–1.24)
GFR, Estimated: 45 mL/min — ABNORMAL LOW (ref >60)
Glucose, Bld: 134 mg/dL — ABNORMAL HIGH (ref 70–99)
Potassium: 3.7 mmol/L (ref 3.5–5.1)
Sodium: 152 mmol/L — ABNORMAL HIGH (ref 135–145)

## 2020-11-07 LAB — GLUCOSE, CAPILLARY
Glucose-Capillary: 107 mg/dL — ABNORMAL HIGH (ref 70–99)
Glucose-Capillary: 112 mg/dL — ABNORMAL HIGH (ref 70–99)
Glucose-Capillary: 123 mg/dL — ABNORMAL HIGH (ref 70–99)
Glucose-Capillary: 126 mg/dL — ABNORMAL HIGH (ref 70–99)
Glucose-Capillary: 126 mg/dL — ABNORMAL HIGH (ref 70–99)
Glucose-Capillary: 134 mg/dL — ABNORMAL HIGH (ref 70–99)

## 2020-11-07 NOTE — Progress Notes (Signed)
PROGRESS NOTE  Clifford [name] [id number] DOB: Nov 20, 1950 DOA: 10/14/2020 Clifford [name]: Clifford [name], Clifford [name]   LOS: 24 days   Brief Narrative / Interim history: Clifford [name] is a 70 y.o. 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   Had an initial stroke 8/3, was admitted 2 days, then discharged with [facility].  After about 4 days at home, he developed worsening of his stroke symptoms, returned to the hospital. In the ER, found to have enlargement of his previous stroke with micro-hemorrhage.  Initially had relatively mild deficits still, NIHSS 4, so his Pradaxa was held and he was admitted for SNF placement for short term rehab.  Here, however, he developed progressively worsening left hemiparesis, then somnolence (see NIHSS chart in progress note 8/24).  Neurology were reconsulted.  Serial head CTs on 8/16 showed expected evolution/progression of his stroke, further edema, but Clifford [name] herniation or substantial midline shift.  Neurology followed the patient and recommended further supportive care and SNF placement.  An NG tube was placed as the patient lost the ability to swallow.  Palliative Care were consulted and in detailed family discussions, with prognosis provided by Neurology (more likely able to regain ability to talk and participate in self cares, unlikely to regain ability to walk, uncertain if able to regain ability to swallow, very unlikely to improve to full independence), it was decided to place PEG and pursue SNF rehab, PEG was placed 8/30.  Subjective / 24h Interval events: Apparently was more alert last night after Provigil.  He is opening his eyes this morning, follows commands, does not speak  Assessment & Plan: Principal Problem Increased petechial hemorrhage/evolution of previous right MCA/PCA territory infarct with mild edema with now increasing midline shift  on repeat MRI 10/28/2020 -MRI mild enlargement with cytotoxic edema and petechial hemorrhage with increased midline shift compared to 10/07/20. -Neurology suspect his current poor functional status is mostly due to swelling, with a component of this from free water and a component of this from bleeding.  They feel this is within the range of expected, and that it is likely to improve, with prognosis outlined above.   -He remains with significant dysphagia, placed by IR 8/30. -Patient still complaining of headache, he will be started on Topamax as discussed with neurology Dr. Leonie Man -Repeat CT head 8/30 showing progressive CVA, midline shift is 10 mm(as opposed to 14 mm on MRI 8/25) -Continue atorvastatin -Due to sinus tach unclear cause, I did a repeat CT scan of the head 9/2 which showed stable CVA, and midline shift is now 6 mm -Neurologic status remains poor but overall stable   Active Problems Hypertension -Continue amlodipine and hydralazine   Suspected focal seizures Presented with left hand twitching which was nonsuppressible and clinically suspicious for stroke, and was started on Keppra.   -Keppra reduced 8/24 to eliminate sedating side effects from [place] from limiting his rehab -Keppra now reduced, started Provigil 9/3  Hypernatremia-due to decreased free water, monitor, overall stable  CKD IIIa -Baseline Cr 1.2-1.4 - BMP daily for now   Dysphagia -Status post PEG tube 8/30    History of recurrent VTE -continue Eliquis   Lung nodule Incidental finding of 33m LUL nodule. Patient former smoker. -Recommend follow up CT in 6-12 months depending on neurological progression   Possible sclerotic vertebra on imaging Incidental finding.  PSA 5, unlikely this sclerosis is from  metastatic prostate cancer with that level.   Chronic diastolic CHF  Appears euvolemic.  Not on diuretic at baseline   Hyperkalemia Resolved with Lokelma   Right eye pain -With some scleral injection,  discussed with ophthalmology dr Posey Pronto , likely due to abrasion from stroke and it remains open,  Scheduled Meds:  amLODipine  10 mg Per Tube Daily   apixaban  5 mg Per Tube BID   atorvastatin  40 mg Per Tube Daily   erythromycin   Right Eye TID   feeding supplement (PROSource TF)  45 mL Per Tube BID   hydrALAZINE  25 mg Per Tube QID   insulin aspart  0-9 Units Subcutaneous Q4H   levETIRAcetam  250 mg Per Tube BID   mouth rinse  15 mL Mouth Rinse BID   modafinil  100 mg Per Tube Daily   pantoprazole sodium  40 mg Per Tube Daily   topiramate  50 mg Per Tube BID   Continuous Infusions:  feeding supplement (JEVITY 1.5 CAL/FIBER) 1,000 mL (11/07/20 0056)   PRN Meds:.acetaminophen **OR** acetaminophen (TYLENOL) oral liquid 160 mg/5 mL **OR** acetaminophen, labetalol, ondansetron (ZOFRAN) IV, polyethylene glycol  Diet Orders (From admission, onward)     Start     Ordered   11/04/20 1120  Diet NPO time specified  Diet effective now        11/04/20 1119            DVT prophylaxis:  apixaban (ELIQUIS) tablet 5 mg     Code Status: DNR  Family Communication: Brother at bedside  Status is: Inpatient  Remains inpatient appropriate because:IV treatments appropriate due to intensity of illness or inability to take PO  Dispo: The patient is from: Home              Anticipated d/c is to: SNF              Patient currently is not medically stable to d/c.   Difficult to place patient Clifford [name]   Level of care: Telemetry Medical  Consultants:  Neurology   Procedures:  none  Microbiology  none  Antimicrobials: none    Objective: Vitals:   11/07/20 0354 11/07/20 0500 11/07/20 0744 11/07/20 1140  BP: 134/87  (!) 151/91 139/88  Pulse: 74  78 (!) 101  Resp: 15  18   Temp: 98.9 F (37.2 C)  97.8 F (36.6 C)   TempSrc: Oral  Oral   SpO2: 100%  100% 100%  Weight:  82.5 kg    Height:        Intake/Output Summary (Last 24 hours) at 11/07/2020 1212 Last data filed at 11/07/2020  0500 Gross per 24 hour  Intake 100 ml  Output 600 ml  Net -500 ml    Filed Weights   11/01/20 0405 11/06/20 0332 11/07/20 0500  Weight: 87.3 kg 81.9 kg 82.5 kg    Examination:  Constitutional: NAD Eyes: Anicteric ENMT: Moist mucous membranes Neck: normal, supple Respiratory: Clear bilaterally, Clifford [name] wheezing, Clifford [name] crackles Cardiovascular: Regular rate and rhythm, Clifford [name] edema Abdomen: Soft, NT, ND, bowel sounds positive Musculoskeletal: Clifford [name] clubbing / cyanosis.  Skin: Clifford [name] rashes seen Neurologic: Left-sided weakness, good strength on right, Clifford [name] new focal deficits  Data Reviewed: I have independently reviewed following labs and imaging studies   CBC: Recent Labs  Lab 11/02/20 0157 11/03/20 0356 11/05/20 0241 11/06/20 0314 11/07/20 0122  WBC 11.7* 9.2 7.5 7.6 8.4  HGB 13.1 12.9* 13.1 13.2 13.2  HCT 41.8 41.3 43.1  42.8 44.7  MCV 82.4 83.6 85.2 85.4 86.1  PLT 154 134* 134* 141* 121*    Basic Metabolic Panel: Recent Labs  Lab 11/03/20 0356 11/04/20 0850 11/05/20 0241 11/06/20 0314 11/07/20 0122  NA 149* 151* 150* 152* 152*  K 4.4 4.3 3.9 4.0 3.7  CL 117* 123* 123* 124* 123*  CO2 25 21* 19* 22 23  GLUCOSE 115* 133* 147* 135* 134*  BUN 62* 55* 54* 61* 62*  CREATININE 1.56* 1.46* 1.46* 1.68* 1.61*  CALCIUM 9.8 9.3 9.3 9.3 9.3    Liver Function Tests: Clifford [name] results for input(s): AST, ALT, ALKPHOS, BILITOT, PROT, ALBUMIN in the last 168 hours. Coagulation Profile: Recent Labs  Lab 11/02/20 0157  INR 1.2    HbA1C: Clifford [name] results for input(s): HGBA1C in the last 72 hours. CBG: Recent Labs  Lab 11/06/20 2026 11/07/20 0031 11/07/20 0456 11/07/20 0744 11/07/20 1140  GLUCAP 145* 123* 134* 107* 126*     Recent Results (from the past 240 hour(s))  Surgical pcr screen     Status: None   Collection Time: 11/02/20  1:30 AM   Specimen: Nasal Mucosa; Nasal Swab  Result Value Ref Range Status   MRSA, PCR NEGATIVE NEGATIVE Final   Staphylococcus aureus NEGATIVE NEGATIVE  Final    Comment: (NOTE) The Xpert SA Assay (FDA approved for NASAL specimens in patients 46 years of age and older), is one component of a comprehensive surveillance program. It is not intended to diagnose infection nor to guide or monitor treatment. Performed at [facility], [place] [address], [place], [place] [address]       Radiology Studies: Clifford [name] results found.   Marzetta Board, MD, PhD [facility]  Between 7 am - 7 pm I am available, please contact me via Amion (for emergencies) or Securechat (non urgent messages)  Between 7 pm - 7 am I am not available, please contact night coverage MD/APP via Amion

## 2020-11-07 NOTE — Progress Notes (Signed)
Physical Therapy Treatment Patient Details Name: Clifford [name] MRN: [id number] DOB: 1950-07-17 Today's Date: 11/07/2020    History of Present Illness 70 yo male presented to ED on 8/11 for R sided headache and worsening L UE weakness. Patient recently admitted 8/3-8/5 for R CVA with L visual deficits and L sided weakness. MRI with mild enlargement of R MCA/border zone infarct since 10/07/20 MRI and increased cytotoxic edema and petechial hemorrhage without midline shift. Palliative Care consulted and in detailed family discussions, with prognosis provided by Neurology (more likely able to regain ability to talk and participate in self cares, unlikely to regain ability to walk, uncertain if able to regain ability to swallow, very unlikely to improve to full independence), it was decided to place PEG and pursue SNF rehab, PEG was placed 8/30. 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    PT Comments    Continuing work on functional mobility and activity tolerance;  Session focused on first time use of Maximove Lift to explore it as an option for safe transfers OOB to chair; Pt tolerated the lift transfer well; Noted he was able to say a few words this session, and seemed to enjoy having Clifford Birthday sung to him   Follow Up Recommendations  SNF;Supervision/Assistance - 24 hour     Equipment Recommendations  Other (comment) (TBA; currently could benefit from specialized wheelchair eval, mechanical lift, and hospital bed)    Recommendations for Other Services       Precautions / Restrictions Precautions Precautions: Fall Precaution Comments: L visual field deficit, R gaze preference, L inattention, L hemparesis; G-tube and abdominal binder Restrictions Other Position/Activity Restrictions: now with cortrak    Mobility  Bed Mobility Overal bed mobility: Needs Assistance Bed Mobility: Rolling Rolling: Max assist;Total assist         General bed mobility comments: Rolled with assist  to address hygeine needs and to place Maximove pad under him    Transfers Overall transfer level: Needs assistance               General transfer comment: Opted to use Maximove today to help Clifford [name] OOB to chair; this proved to be a safe adn tolerable modality to help pt OOB to the recliner; Once up, pt nodded "yes" that the lift transfer OOB was tolerable  Ambulation/Gait                 Stairs             Wheelchair Mobility    Modified Rankin (Stroke Patients Only) Modified Rankin (Stroke Patients Only) Pre-Morbid Rankin Score: Moderately severe disability Modified Rankin: Severe disability     Balance                                            Cognition Arousal/Alertness: Awake/alert (Eyes open to verbal/tactile interaction) Behavior During Therapy: WFL for tasks assessed/performed Overall Cognitive Status: Difficult to assess Area of Impairment: Attention;Safety/judgement;Awareness;Problem solving                   Current Attention Level: Sustained   Following Commands: Follows one step commands inconsistently;Follows one step commands with increased time Safety/Judgement: Decreased awareness of deficits;Decreased awareness of safety Awareness: Intellectual Problem Solving: Slow processing;Requires verbal cues;Requires tactile cues;Decreased initiation;Difficulty sequencing General Comments: pt able to open eyes and assist with wiping face with  a washcloth, pt smiles to command (continues to demo L facial droop); pt nods head yes/no appropriately to questions today; moved his R hand to his heart after staff sang Clifford [name] to him      Exercises      General Comments General comments (skin integrity, edema, etc.): Incontinent of stool upon arrival; No noted distress      Pertinent Vitals/Pain Pain Assessment: No/denies pain Faces Pain Scale: No hurt Pain Intervention(s): Monitored during session    Home  Living                      Prior Function            PT Goals (current goals can now be found in the care plan section) Acute Rehab PT Goals Patient Stated Goal: pt nodding his head yes to wanting to sit EOB Time For Goal Achievement: 11/10/20 Potential to Achieve Goals: Poor Progress towards PT goals: Progressing toward goals (slowly)    Frequency    Min 3X/week      PT Plan Current plan remains appropriate    Co-evaluation              AM-PAC PT "6 Clicks" Mobility   Outcome Measure  Help needed turning from your back to your side while in a flat bed without using bedrails?: A Lot Help needed moving from lying on your back to sitting on the side of a flat bed without using bedrails?: A Lot Help needed moving to and from a bed to a chair (including a wheelchair)?: A Lot Help needed standing up from a chair using your arms (e.g., wheelchair or bedside chair)?: A Lot Help needed to walk in hospital room?: A Lot Help needed climbing 3-5 steps with a railing? : A Lot 6 Click Score: 12    End of Session Equipment Utilized During Treatment: Other (comment) (Abdominal binder) Activity Tolerance: Patient tolerated treatment well Patient left: in chair;with call Kock/phone within reach;with chair alarm set Nurse Communication: Mobility status PT Visit Diagnosis: Unsteadiness on feet (R26.81);Muscle weakness (generalized) (M62.81);Other symptoms and signs involving the nervous system (R29.898);Other abnormalities of gait and mobility (R26.89)     Time: [id number] PT Time Calculation (min) (ACUTE ONLY): 31 min  Charges:                        Roney Marion, PT  Acute Rehabilitation Services Pager [phone] Office [phone]    [place] 11/07/2020, 1:03 PM

## 2020-11-08 DIAGNOSIS — R531 Weakness: Secondary | ICD-10-CM | POA: Diagnosis not present

## 2020-11-08 LAB — GLUCOSE, CAPILLARY
Glucose-Capillary: 100 mg/dL — ABNORMAL HIGH (ref 70–99)
Glucose-Capillary: 111 mg/dL — ABNORMAL HIGH (ref 70–99)
Glucose-Capillary: 124 mg/dL — ABNORMAL HIGH (ref 70–99)
Glucose-Capillary: 134 mg/dL — ABNORMAL HIGH (ref 70–99)
Glucose-Capillary: 136 mg/dL — ABNORMAL HIGH (ref 70–99)
Glucose-Capillary: 141 mg/dL — ABNORMAL HIGH (ref 70–99)

## 2020-11-08 NOTE — Plan of Care (Signed)
  Problem: Nutrition: Goal: Adequate nutrition will be maintained Outcome: Progressing   Problem: Coping: Goal: Level of anxiety will decrease Outcome: Progressing   Problem: Safety: Goal: Ability to remain free from injury will improve Outcome: Progressing   Problem: Skin Integrity: Goal: Risk for impaired skin integrity will decrease Outcome: Progressing   

## 2020-11-08 NOTE — Progress Notes (Signed)
PROGRESS NOTE  Clifford [name] [id number] DOB: 06-18-1950 DOA: 10/14/2020 PCP: Pcp, No   LOS: 25 days   Brief Narrative / Interim history: Clifford [name] is a 70 y.o. 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   Had an initial stroke 8/3, was admitted 2 days, then discharged with [facility].  After about 4 days at home, he developed worsening of his stroke symptoms, returned to the hospital. In the ER, found to have enlargement of his previous stroke with micro-hemorrhage.  Initially had relatively mild deficits still, NIHSS 4, so his Pradaxa was held and he was admitted for SNF placement for short term rehab.  Here, however, he developed progressively worsening left hemiparesis, then somnolence (see NIHSS chart in progress note 8/24).  Neurology were reconsulted.  Serial head CTs on 8/16 showed expected evolution/progression of his stroke, further edema, but no herniation or substantial midline shift.  Neurology followed the patient and recommended further supportive care and SNF placement.  An NG tube was placed as the patient lost the ability to swallow.  Palliative Care were consulted and in detailed family discussions, with prognosis provided by Neurology (more likely able to regain ability to talk and participate in self cares, unlikely to regain ability to walk, uncertain if able to regain ability to swallow, very unlikely to improve to full independence), it was decided to place PEG and pursue SNF rehab, PEG was placed 8/30.  Subjective / 24h Interval events: Sleeping when I entered the room but wakes up, follows commands, shakes his head no when asked whether anything is bothering him/he has any pain.  Apparently he said a few words yesterday when work with PT but does not answer my questions this morning  Assessment & Plan: Principal  Problem Increased petechial hemorrhage/evolution of previous right MCA/PCA territory infarct with mild edema with now increasing midline shift on repeat MRI 10/28/2020 -MRI mild enlargement with cytotoxic edema and petechial hemorrhage with increased midline shift compared to 10/07/20. -Neurology suspect his current poor functional status is mostly due to swelling, with a component of this from free water and a component of this from bleeding.  They feel this is within the range of expected, and that it is likely to improve, with prognosis outlined above.   -He remains with significant dysphagia, placed by IR 8/30. -Patient still complaining of headache, he will be started on Topamax as discussed with neurology Dr. Leonie Man -Repeat CT head 8/30 showing progressive CVA, midline shift is 10 mm(as opposed to 14 mm on MRI 8/25) -Continue atorvastatin -Due to sinus tach unclear cause, I did a repeat CT scan of the head 9/2 which showed stable CVA, and midline shift is now 6 mm -Poor neurological status but stable  Active Problems Hypertension -Continue amlodipine and hydralazine   Suspected focal seizures Presented with left hand twitching which was nonsuppressible and clinically suspicious for stroke, and was started on Keppra.   -Keppra reduced 8/24 to eliminate sedating side effects from [place] from limiting his rehab -Keppra now reduced even more, started Provigil 9/3.  Could potentially DC Keppra later this week  Hypernatremia-due to decreased free water, monitor, overall stable.  Avoid large quantities of free water to prevent brain cytotoxic edema  CKD IIIa -Baseline Cr 1.2-1.4 - BMP daily for now   Dysphagia -Status post PEG tube 8/30    History of recurrent VTE -  continue Eliquis   Lung nodule Incidental finding of 103m LUL nodule. Patient former smoker. -Recommend follow up CT in 6-12 months depending on neurological progression   Possible sclerotic vertebra on imaging Incidental  finding.  PSA 5, unlikely this sclerosis is from metastatic prostate cancer with that level.   Chronic diastolic CHF  Appears euvolemic.  Not on diuretic at baseline   Hyperkalemia Resolved with Lokelma   Right eye pain -With some scleral injection, discussed with ophthalmology dr PPosey Pronto, likely due to abrasion from stroke and it remains open,  Scheduled Meds:  amLODipine  10 mg Per Tube Daily   apixaban  5 mg Per Tube BID   atorvastatin  40 mg Per Tube Daily   erythromycin   Right Eye TID   feeding supplement (PROSource TF)  45 mL Per Tube BID   hydrALAZINE  25 mg Per Tube QID   insulin aspart  0-9 Units Subcutaneous Q4H   levETIRAcetam  250 mg Per Tube BID   mouth rinse  15 mL Mouth Rinse BID   modafinil  100 mg Per Tube Daily   pantoprazole sodium  40 mg Per Tube Daily   topiramate  50 mg Per Tube BID   Continuous Infusions:  feeding supplement (JEVITY 1.5 CAL/FIBER) 1,000 mL (11/07/20 1654)   PRN Meds:.acetaminophen **OR** acetaminophen (TYLENOL) oral liquid 160 mg/5 mL **OR** acetaminophen, labetalol, ondansetron (ZOFRAN) IV, polyethylene glycol  Diet Orders (From admission, onward)     Start     Ordered   11/04/20 1120  Diet NPO time specified  Diet effective now        11/04/20 1119            DVT prophylaxis:  apixaban (ELIQUIS) tablet 5 mg     Code Status: DNR  Family Communication: Brother at bedside  Status is: Inpatient  Remains inpatient appropriate because:IV treatments appropriate due to intensity of illness or inability to take PO  Dispo: The patient is from: Home              Anticipated d/c is to: SNF              Patient currently is not medically stable to d/c.   Difficult to place patient No   Level of care: Telemetry Medical  Consultants:  Neurology   Procedures:  none  Microbiology  none  Antimicrobials: none    Objective: Vitals:   11/08/20 0357 11/08/20 0600 11/08/20 0716 11/08/20 1130  BP: (!) 134/112  (!) 156/95  (!) 148/79  Pulse: 91  86 (!) 101  Resp: '19  18 20  '$ Temp: (!) 97.5 F (36.4 C)   97.7 F (36.5 C)  TempSrc: Oral   Oral  SpO2: 100%  100% 100%  Weight:  83.6 kg    Height:        Intake/Output Summary (Last 24 hours) at 11/08/2020 1135 Last data filed at 11/08/2020 0700 Gross per 24 hour  Intake --  Output 1775 ml  Net -1775 ml    Filed Weights   11/06/20 0332 11/07/20 0500 11/08/20 0600  Weight: 81.9 kg 82.5 kg 83.6 kg    Examination:  Constitutional: He is in no distress ENMT: Moist extremities Neck: normal, supple Respiratory: Lungs are clear to auscultation bilaterally, no wheezing, no crackles Cardiovascular: Regular rate and rhythm, no murmurs, no peripheral edema Abdomen: Soft, nontender, nondistended, positive bowel sounds Musculoskeletal: no clubbing / cyanosis.  Skin: No rashes seen Neurologic: Left-sided weakness, good strength on  right, no new focal deficits.  He is following commands.  Data Reviewed: I have independently reviewed following labs and imaging studies   CBC: Recent Labs  Lab 11/02/20 0157 11/03/20 0356 11/05/20 0241 11/06/20 0314 11/07/20 0122  WBC 11.7* 9.2 7.5 7.6 8.4  HGB 13.1 12.9* 13.1 13.2 13.2  HCT 41.8 41.3 43.1 42.8 44.7  MCV 82.4 83.6 85.2 85.4 86.1  PLT 154 134* 134* 141* 121*    Basic Metabolic Panel: Recent Labs  Lab 11/03/20 0356 11/04/20 0850 11/05/20 0241 11/06/20 0314 11/07/20 0122  NA 149* 151* 150* 152* 152*  K 4.4 4.3 3.9 4.0 3.7  CL 117* 123* 123* 124* 123*  CO2 25 21* 19* 22 23  GLUCOSE 115* 133* 147* 135* 134*  BUN 62* 55* 54* 61* 62*  CREATININE 1.56* 1.46* 1.46* 1.68* 1.61*  CALCIUM 9.8 9.3 9.3 9.3 9.3    Liver Function Tests: No results for input(s): AST, ALT, ALKPHOS, BILITOT, PROT, ALBUMIN in the last 168 hours. Coagulation Profile: Recent Labs  Lab 11/02/20 0157  INR 1.2    HbA1C: No results for input(s): HGBA1C in the last 72 hours. CBG: Recent Labs  Lab 11/07/20 1609  11/07/20 2002 11/08/20 0026 11/08/20 0401 11/08/20 0812  GLUCAP 126* 112* 111* 136* 134*     Recent Results (from the past 240 hour(s))  Surgical pcr screen     Status: None   Collection Time: 11/02/20  1:30 AM   Specimen: Nasal Mucosa; Nasal Swab  Result Value Ref Range Status   MRSA, PCR NEGATIVE NEGATIVE Final   Staphylococcus aureus NEGATIVE NEGATIVE Final    Comment: (NOTE) The Xpert SA Assay (FDA approved for NASAL specimens in patients 53 years of age and older), is one component of a comprehensive surveillance program. It is not intended to diagnose infection nor to guide or monitor treatment. Performed at [facility], [place] [address], [place], [place] [address]       Radiology Studies: No results found.   Marzetta Board, MD, PhD [facility]  Between 7 am - 7 pm I am available, please contact me via Amion (for emergencies) or Securechat (non urgent messages)  Between 7 pm - 7 am I am not available, please contact night coverage MD/APP via Amion

## 2020-11-08 NOTE — Progress Notes (Signed)
  Speech Language Pathology Treatment: Dysphagia  Patient Details Name: JB KODA MRN: [id number] DOB: [birth year] Today's Date: 11/08/2020 Time: [id number] SLP Time Calculation (min) (ACUTE ONLY): 10 min  Assessment / Plan / Recommendation Clinical Impression  Pt very lethargic upon SLP arrival, but did awaken during process of positioning upright in bed and with damp swab to oral cavity. Administered ice chip x1 via teaspoon with no oral manipulation observed and pt alertness rapidly decreasing till he ultimately fell asleep. Ice chip removed via oral suction and trials terminated. Poor mentation continues to impact safety for any oral intake. SLP will continue f/u.   HPI HPI: 70yo male admitted 10/14/20 with right side headache and LUE weakness. 10/19/20 pt was noted to have increased LUE weakness. 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 CTHead = evolving RMCA infarct with cytotoxic edema, mass effect      SLP Plan  Continue with current plan of care       Recommendations  Diet recommendations: NPO Medication Administration: Via alternative means                Oral Care Recommendations: Oral care QID;Staff/trained caregiver to provide oral care Follow up Recommendations: Skilled Nursing facility;24 hour supervision/assistance SLP Visit Diagnosis: Dysphagia, oropharyngeal phase (R13.12) Plan: Continue with current plan of care       [place], [place], [place] Office Number: [place] 11/08/2020, 3:07 PM

## 2020-11-09 DIAGNOSIS — R531 Weakness: Secondary | ICD-10-CM | POA: Diagnosis not present

## 2020-11-09 LAB — CBC
HCT: 44.2 % (ref 39.0–52.0)
Hemoglobin: 13.4 g/dL (ref 13.0–17.0)
MCH: 26 pg (ref 26.0–34.0)
MCHC: 30.3 g/dL (ref 30.0–36.0)
MCV: 85.8 fL (ref 80.0–100.0)
Platelets: 128 10*3/uL — ABNORMAL LOW (ref 150–400)
RBC: 5.15 MIL/uL (ref 4.22–5.81)
RDW: 15.9 % — ABNORMAL HIGH (ref 11.5–15.5)
WBC: 8.9 10*3/uL (ref 4.0–10.5)
nRBC: 0 % (ref 0.0–0.2)

## 2020-11-09 LAB — GLUCOSE, CAPILLARY
Glucose-Capillary: 109 mg/dL — ABNORMAL HIGH (ref 70–99)
Glucose-Capillary: 132 mg/dL — ABNORMAL HIGH (ref 70–99)
Glucose-Capillary: 137 mg/dL — ABNORMAL HIGH (ref 70–99)
Glucose-Capillary: 137 mg/dL — ABNORMAL HIGH (ref 70–99)
Glucose-Capillary: 151 mg/dL — ABNORMAL HIGH (ref 70–99)
Glucose-Capillary: 162 mg/dL — ABNORMAL HIGH (ref 70–99)

## 2020-11-09 LAB — BASIC METABOLIC PANEL
Anion gap: 7 (ref 5–15)
BUN: 65 mg/dL — ABNORMAL HIGH (ref 8–23)
CO2: 22 mmol/L (ref 22–32)
Calcium: 9.4 mg/dL (ref 8.9–10.3)
Chloride: 124 mmol/L — ABNORMAL HIGH (ref 98–111)
Creatinine, Ser: 1.82 mg/dL — ABNORMAL HIGH (ref 0.61–1.24)
GFR, Estimated: 39 mL/min — ABNORMAL LOW (ref >60)
Glucose, Bld: 110 mg/dL — ABNORMAL HIGH (ref 70–99)
Potassium: 3.9 mmol/L (ref 3.5–5.1)
Sodium: 153 mmol/L — ABNORMAL HIGH (ref 135–145)

## 2020-11-09 LAB — HEPATIC FUNCTION PANEL
ALT: 83 U/L — ABNORMAL HIGH (ref 0–44)
AST: 56 U/L — ABNORMAL HIGH (ref 15–41)
Albumin: 3 g/dL — ABNORMAL LOW (ref 3.5–5.0)
Alkaline Phosphatase: 74 U/L (ref 38–126)
Bilirubin, Direct: 0.1 mg/dL (ref 0.0–0.2)
Indirect Bilirubin: 0.6 mg/dL (ref 0.3–0.9)
Total Bilirubin: 0.7 mg/dL (ref 0.3–1.2)
Total Protein: 7.1 g/dL (ref 6.5–8.1)

## 2020-11-09 MED ORDER — SODIUM CHLORIDE 0.9 % IV SOLN: INTRAVENOUS | Status: AC

## 2020-11-09 MED ORDER — NYSTATIN 100000 UNIT/ML MT SUSP
5.0000 mL | Freq: Four times a day (QID) | OROMUCOSAL | Status: DC
  Administered 2020-11-09 – 2020-11-10 (×4): 500000 [IU]
  Filled 2020-11-09 (×5): qty 5

## 2020-11-09 NOTE — Progress Notes (Signed)
Physical Therapy Treatment Patient Details Name: Clifford [name] MRN: [id number] DOB: 1950/10/30 Today's Date: 11/09/2020    History of Present Illness 70 yo male presented to ED on 8/11 for R sided headache and worsening L UE weakness. Patient recently admitted 8/3-8/5 for R CVA with L visual deficits and L sided weakness. MRI with mild enlargement of R MCA/border zone infarct since 10/07/20 MRI and increased cytotoxic edema and petechial hemorrhage without midline shift. Palliative Care consulted and in detailed family discussions, with prognosis provided by Neurology (more likely able to regain ability to talk and participate in self cares, unlikely to regain ability to walk, uncertain if able to regain ability to swallow, very unlikely to improve to full independence), it was decided to place PEG and pursue SNF rehab, PEG was placed 8/30. 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    PT Comments    Pt received in supine, drowsy but able to be awoken with increased time and with fair participation and tolerance for bed mobility and seated balance training. Pt with increased posterior lean/fatigue this date (per family he was on phone/alert earlier in day and mentally engaged with that task so more fatigued now) so emphasis on seated balance/self-assist with bed features and trunk posture at EOB. Pt with increased L lean and cervical tilt down and to L with fatigue, pt gestures that he is interested in return to supine, tolerated seated posture ~8 minutes today. Plan to maxi-move lift pt to chair next session (lift pad obtained and placed in room) to practice transfers from recliner<>Stedy if pt alert.  Follow Up Recommendations  SNF;Supervision/Assistance - 24 hour     Equipment Recommendations  Other (comment) (TBA; currently could benefit from specialized wheelchair eval, mechanical lift, and hospital bed)    Recommendations for Other Services       Precautions / Restrictions  Precautions Precautions: Fall Precaution Comments: L visual field deficit, R gaze preference, L inattention, L hemparesis; G-tube and abdominal binder Restrictions Weight Bearing Restrictions: No    Mobility  Bed Mobility Overal bed mobility: Needs Assistance Bed Mobility: Rolling Rolling: Max assist;Total assist Sidelying to sit: Max assist;HOB elevated;+2 for physical assistance;+2 for safety/equipment     Sit to sidelying: +2 for physical assistance;Max assist General bed mobility comments: Rolled with assist to address hygeine needs and to place a new bed pad under him as his was wet. NT notified pt will need a bath after session (his external catheter was not working properly prior to PTA arrival to room)    Transfers     General transfer comment: No Maxi-move lift pad in room or on unit so unit secretary ordered one, pad placed in room after session to facilitate pt OOB with nursing assist later in day if appropriate; pt unable to stand today due to fatigue/lethargy after sitting EOB ~8 mins and requesting to return to supine; per pt's brother Clifford [name]", he had spoken on phone with multiple family members today         Modified Rankin (Stroke Patients Only) Modified Rankin (Stroke Patients Only) Pre-Morbid Rankin Score: Moderately severe disability Modified Rankin: Severe disability     Balance Overall balance assessment: Needs assistance Sitting-balance support: Single extremity supported;Feet supported Sitting balance-Leahy Scale: Zero Sitting balance - Comments: able to use RUE to assist with balance with mod/maxA but progressed to totalA due to pt fatigue vs distraction; +2 assist needed to guide pt back into bed Postural control: Posterior lean  Standing balance comment: pt unable today, poor seated balance and too fatigued to attempt after sitting         Cognition Arousal/Alertness: Awake/alert (Eyes open to verbal/tactile interaction) Behavior  During Therapy: [facility] for tasks assessed/performed Overall Cognitive Status: Difficult to assess Area of Impairment: Attention;Safety/judgement;Awareness;Problem solving        Current Attention Level: Sustained   Following Commands: Follows one step commands inconsistently;Follows one step commands with increased time Safety/Judgement: Decreased awareness of deficits;Decreased awareness of safety Awareness: Intellectual Problem Solving: Slow processing;Requires verbal cues;Requires tactile cues;Decreased initiation;Difficulty sequencing General Comments: pt able to open eyes after staff assisted him to wipe them with a washcloth, but more drowsy this date.      Exercises Other Exercises Other Exercises: seated RLE AAROM: hip flexion, LAQ x10 reps ea Other Exercises: Seated RUE reaching x 5 reps in varying planes, pt unable to reach >70 deg in shoulder flexion Other Exercises: pt pulling/pushing on L rail when sidelying in bed for RUE strengthening Other Exercises: RUE gross grasp x 10 reps    General Comments General comments (skin integrity, edema, etc.): pt incontinent, NT notified he needs a bath and entering room to assist him with this at end of session      Pertinent Vitals/Pain Pain Assessment: Faces Faces Pain Scale: No hurt Pain Intervention(s): Monitored during session;Repositioned     PT Goals (current goals can now be found in the care plan section) Acute Rehab PT Goals Patient Stated Goal: pt nodding his head yes to wanting to sit EOB PT Goal Formulation: With patient Time For Goal Achievement: 11/10/20 Potential to Achieve Goals: Poor Progress towards PT goals: Progressing toward goals    Frequency    Min 3X/week      PT Plan Current plan remains appropriate    AM-PAC PT "6 Clicks" Mobility   Outcome Measure  Help needed turning from your back to your side while in a flat bed without using bedrails?: A Lot Help needed moving from lying on your back  to sitting on the side of a flat bed without using bedrails?: Total Help needed moving to and from a bed to a chair (including a wheelchair)?: Total Help needed standing up from a chair using your arms (e.g., wheelchair or bedside chair)?: Total Help needed to walk in hospital room?: Total Help needed climbing 3-5 steps with a railing? : Total 6 Click Score: 7    End of Session Equipment Utilized During Treatment: Other (comment) (Abdominal binder) Activity Tolerance: Patient tolerated treatment well;Patient limited by fatigue Patient left: in bed;with call [place]/phone within reach;with bed alarm set Nurse Communication: Mobility status PT Visit Diagnosis: Unsteadiness on feet (R26.81);Muscle weakness (generalized) (M62.81);Other symptoms and signs involving the nervous system (R29.898);Other abnormalities of gait and mobility (R26.89)     Time: [id number] PT Time Calculation (min) (ACUTE ONLY): 23 min  Charges:  $Therapeutic Exercise: 8-22 mins $Therapeutic Activity: 8-22 mins                     Derrin Currey P., PTA Acute Rehabilitation Services Pager: [phone] Office: [place] 11/09/2020, 4:30 PM

## 2020-11-09 NOTE — Progress Notes (Signed)
PROGRESS NOTE  Clifford [name] [id number] DOB: May 11, 1950 DOA: 10/14/2020 PCP: Pcp, No   LOS: 26 days   Brief Narrative / Interim history: Clifford [name] is a 70 y.o. 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   Had an initial stroke 8/3, was admitted 2 days, then discharged with [facility].  After about 4 days at home, he developed worsening of his stroke symptoms, returned to the hospital. In the ER, found to have enlargement of his previous stroke with micro-hemorrhage.  Initially had relatively mild deficits still, NIHSS 4, so his Pradaxa was held and he was admitted for SNF placement for short term rehab.  Here, however, he developed progressively worsening left hemiparesis, then somnolence (see NIHSS chart in progress note 8/24).  Neurology were reconsulted.  Serial head CTs on 8/16 showed expected evolution/progression of his stroke, further edema, but no herniation or substantial midline shift.  Neurology followed the patient and recommended further supportive care and SNF placement.  An NG tube was placed as the patient lost the ability to swallow.  Palliative Care were consulted and in detailed family discussions, with prognosis provided by Neurology (more likely able to regain ability to talk and participate in self cares, unlikely to regain ability to walk, uncertain if able to regain ability to swallow, very unlikely to improve to full independence), it was decided to place PEG and pursue SNF rehab, PEG was placed 8/30.  Subjective / 24h Interval events: Wakes up when prompted, keeps his eyes closed, following commands  Assessment & Plan: Principal Problem Increased petechial hemorrhage/evolution of previous right MCA/PCA territory infarct with mild edema with now increasing midline shift on repeat MRI 10/28/2020 -MRI mild enlargement with  cytotoxic edema and petechial hemorrhage with increased midline shift compared to 10/07/20. -Neurology suspect his current poor functional status is mostly due to swelling, with a component of this from free water and a component of this from bleeding.  They feel this is within the range of expected, and that it is likely to improve, with prognosis outlined above.   -He remains with significant dysphagia, placed by IR 8/30. -Patient still complaining of headache, he will be started on Topamax as discussed with neurology Dr. Leonie Man -Repeat CT head 8/30 showing progressive CVA, midline shift is 10 mm(as opposed to 14 mm on MRI 8/25) -Continue atorvastatin -Due to sinus tach unclear cause, I did a repeat CT scan of the head 9/2 which showed stable CVA, and midline shift is now 6 mm -Poor neurological status but stable -Awaiting SNF  Active Problems Hypertension -Continue amlodipine and hydralazine   Suspected focal seizures Presented with left hand twitching which was nonsuppressible and clinically suspicious for stroke, and was started on Keppra.   -Keppra reduced 8/24 to eliminate sedating side effects from [place] from limiting his rehab -Keppra now reduced even more, started Provigil 9/3.  Could potentially DC Keppra later this week  Hypernatremia-due to decreased free water, monitor, overall stable.  Sodium 153 this morning, creatinine is up a bit, start normal saline.  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  Patient former smoker. -Recommend follow up CT in 6-12 months depending on neurological progression   Possible sclerotic vertebra on imaging Incidental finding.  PSA 5, unlikely this sclerosis is from  metastatic prostate cancer with that level.   Chronic diastolic CHF  Appears euvolemic.  Not on diuretic at baseline   Hyperkalemia Resolved with Lokelma   Right eye pain -With some scleral injection, discussed with ophthalmology dr Posey Pronto , likely due to abrasion from stroke and it remains open,  Scheduled Meds:  amLODipine  10 mg Per Tube Daily   apixaban  5 mg Per Tube BID   atorvastatin  40 mg Per Tube Daily   erythromycin   Right Eye TID   feeding supplement (PROSource TF)  45 mL Per Tube BID   hydrALAZINE  25 mg Per Tube QID   insulin aspart  0-9 Units Subcutaneous Q4H   levETIRAcetam  250 mg Per Tube BID   mouth rinse  15 mL Mouth Rinse BID   modafinil  100 mg Per Tube Daily   pantoprazole sodium  40 mg Per Tube Daily   topiramate  50 mg Per Tube BID   Continuous Infusions:  sodium chloride 75 mL/hr at 11/09/20 0804   feeding supplement (JEVITY 1.5 CAL/FIBER) 1,000 mL (11/07/20 1654)   PRN Meds:.acetaminophen **OR** acetaminophen (TYLENOL) oral liquid 160 mg/5 mL **OR** acetaminophen, labetalol, ondansetron (ZOFRAN) IV, polyethylene glycol  Diet Orders (From admission, onward)     Start     Ordered   11/04/20 1120  Diet NPO time specified  Diet effective now        11/04/20 1119            DVT prophylaxis:  apixaban (ELIQUIS) tablet 5 mg     Code Status: DNR  Family Communication: Brother at bedside  Status is: Inpatient  Remains inpatient appropriate because:IV treatments appropriate due to intensity of illness or inability to take PO  Dispo: The patient is from: Home              Anticipated d/c is to: SNF              Patient currently is not medically stable to d/c.   Difficult to place patient No   Level of care: Telemetry Medical  Consultants:  Neurology   Procedures:  none  Microbiology  none  Antimicrobials: none    Objective: Vitals:   11/08/20 2017 11/09/20 0004 11/09/20 0413 11/09/20 0840  BP: (!) 157/87 (!) 152/88 (!)  152/85 (!) 149/92  Pulse: 85 95 96 (!) 107  Resp: '18 19 18 14  '$ Temp: 98.5 F (36.9 C) 98.5 F (36.9 C) 98.7 F (37.1 C) 98.6 F (37 C)  TempSrc: Oral  Oral Oral  SpO2: 100% 99% 99% 99%  Weight:      Height:        Intake/Output Summary (Last 24 hours) at 11/09/2020 1050 Last data filed at 11/08/2020 1848 Gross per 24 hour  Intake --  Output 630 ml  Net -630 ml    Filed Weights   11/06/20 0332 11/07/20 0500 11/08/20 0600  Weight: 81.9 kg 82.5 kg 83.6 kg    Examination:  Constitutional: nad, in bed ENMT: dry mm Neck: normal, supple Respiratory: CTA biL, no wheezing, no crackles Cardiovascular: rrr, no mrg, no edema Abdomen: soft, nt, nd, bs+ Musculoskeletal: no clubbing / cyanosis.  Skin: no rashes Neurologic: Left-sided weakness, good strength on right, no new focal deficits, following commands  Data Reviewed: I  have independently reviewed following labs and imaging studies   CBC: Recent Labs  Lab 11/03/20 0356 11/05/20 0241 11/06/20 0314 11/07/20 0122 11/09/20 0052  WBC 9.2 7.5 7.6 8.4 8.9  HGB 12.9* 13.1 13.2 13.2 13.4  HCT 41.3 43.1 42.8 44.7 44.2  MCV 83.6 85.2 85.4 86.1 85.8  PLT 134* 134* 141* 121* 128*    Basic Metabolic Panel: Recent Labs  Lab 11/04/20 0850 11/05/20 0241 11/06/20 0314 11/07/20 0122 11/09/20 0052  NA 151* 150* 152* 152* 153*  K 4.3 3.9 4.0 3.7 3.9  CL 123* 123* 124* 123* 124*  CO2 21* 19* '22 23 22  '$ GLUCOSE 133* 147* 135* 134* 110*  BUN 55* 54* 61* 62* 65*  CREATININE 1.46* 1.46* 1.68* 1.61* 1.82*  CALCIUM 9.3 9.3 9.3 9.3 9.4    Liver Function Tests: Recent Labs  Lab 11/09/20 0052  AST 56*  ALT 83*  ALKPHOS 74  BILITOT 0.7  PROT 7.1  ALBUMIN 3.0*   Coagulation Profile: No results for input(s): INR, PROTIME in the last 168 hours.  HbA1C: No results for input(s): HGBA1C in the last 72 hours. CBG: Recent Labs  Lab 11/08/20 1619 11/08/20 2017 11/09/20 0008 11/09/20 0411 11/09/20 0840  GLUCAP 100* 141* 132*  137* 151*     Recent Results (from the past 240 hour(s))  Surgical pcr screen     Status: None   Collection Time: 11/02/20  1:30 AM   Specimen: Nasal Mucosa; Nasal Swab  Result Value Ref Range Status   MRSA, PCR NEGATIVE NEGATIVE Final   Staphylococcus aureus NEGATIVE NEGATIVE Final    Comment: (NOTE) The Xpert SA Assay (FDA approved for NASAL specimens in patients 77 years of age and older), is one component of a comprehensive surveillance program. It is not intended to diagnose infection nor to guide or monitor treatment. Performed at [facility], [place] [address], [place], [place] [address]       Radiology Studies: No results found.   Marzetta Board, MD, PhD [facility]  Between 7 am - 7 pm I am available, please contact me via Amion (for emergencies) or Securechat (non urgent messages)  Between 7 pm - 7 am I am not available, please contact night coverage MD/APP via Amion

## 2020-11-09 NOTE — Plan of Care (Signed)
  Problem: Education: Goal: Knowledge of General Education information will improve Description: Including pain rating scale, medication(s)/side effects and non-pharmacologic comfort measures Outcome: Progressing   Problem: Clinical Measurements: Goal: Ability to maintain clinical measurements within normal limits will improve Outcome: Progressing Goal: Will remain free from infection Outcome: Progressing Goal: Diagnostic test results will improve Outcome: Progressing Goal: Respiratory complications will improve Outcome: Progressing Goal: Cardiovascular complication will be avoided Outcome: Progressing   Problem: Intracerebral Hemorrhage Tissue Perfusion: Goal: Complications of Intracerebral Hemorrhage will be minimized Outcome: Progressing   Problem: Ischemic Stroke/TIA Tissue Perfusion: Goal: Complications of ischemic stroke/TIA will be minimized Outcome: Progressing   

## 2020-11-09 NOTE — Progress Notes (Signed)
Name: Clifford [name] DOB: 06/29/1950  Please be advised that the above-named patient will require a short-term nursing home stay -- anticipated 30 days or less for rehabilitation and strengthening. The plan is for return home.

## 2020-11-09 NOTE — TOC Progression Note (Signed)
Transition of Care [facility]) - Progression Note    Patient Details  Name: Clifford [name] MRN: [id number] Date of Birth: 10-03-1950  Transition of Care [facility]) CM/SW [place], [place] Phone Number: 11/09/2020, 2:20 PM  Clinical Narrative:   CSW reached out to [facility] to review referral and discussed insurance coverage. Patient will have no copay coverage after 20 days, [facility] asking for Medicaid application to be started prior to admission. No beds available at this time, will update CSW with when they might be able to take him. CSW sent financial counseling an email to request assistance with a Medicaid application. CSW to follow.    Expected Discharge Plan: Skilled Nursing Facility Barriers to Discharge: [place] (PASRR), Continued Medical Work up, Ship broker, Inadequate or no Biomedical scientist Expected Discharge Plan: [place] Choice: [place] arrangements for the past 2 months: Single Family Home                                       Social Determinants of Health (SDOH) Interventions    Readmission Risk Interventions No flowsheet data found.

## 2020-11-10 LAB — BASIC METABOLIC PANEL
Anion gap: 12 (ref 5–15)
BUN: 60 mg/dL — ABNORMAL HIGH (ref 8–23)
CO2: 19 mmol/L — ABNORMAL LOW (ref 22–32)
Calcium: 9.2 mg/dL (ref 8.9–10.3)
Chloride: 123 mmol/L — ABNORMAL HIGH (ref 98–111)
Creatinine, Ser: 1.77 mg/dL — ABNORMAL HIGH (ref 0.61–1.24)
GFR, Estimated: 40 mL/min — ABNORMAL LOW (ref >60)
Glucose, Bld: 169 mg/dL — ABNORMAL HIGH (ref 70–99)
Potassium: 3.7 mmol/L (ref 3.5–5.1)
Sodium: 154 mmol/L — ABNORMAL HIGH (ref 135–145)

## 2020-11-10 LAB — CBC
HCT: 44.4 % (ref 39.0–52.0)
Hemoglobin: 13.3 g/dL (ref 13.0–17.0)
MCH: 25.8 pg — ABNORMAL LOW (ref 26.0–34.0)
MCHC: 30 g/dL (ref 30.0–36.0)
MCV: 86 fL (ref 80.0–100.0)
Platelets: 127 10*3/uL — ABNORMAL LOW (ref 150–400)
RBC: 5.16 MIL/uL (ref 4.22–5.81)
RDW: 16.1 % — ABNORMAL HIGH (ref 11.5–15.5)
WBC: 6.4 10*3/uL (ref 4.0–10.5)
nRBC: 0 % (ref 0.0–0.2)

## 2020-11-10 LAB — GLUCOSE, CAPILLARY
Glucose-Capillary: 113 mg/dL — ABNORMAL HIGH (ref 70–99)
Glucose-Capillary: 118 mg/dL — ABNORMAL HIGH (ref 70–99)
Glucose-Capillary: 132 mg/dL — ABNORMAL HIGH (ref 70–99)
Glucose-Capillary: 147 mg/dL — ABNORMAL HIGH (ref 70–99)
Glucose-Capillary: 154 mg/dL — ABNORMAL HIGH (ref 70–99)
Glucose-Capillary: 157 mg/dL — ABNORMAL HIGH (ref 70–99)

## 2020-11-10 MED ORDER — NYSTATIN 100000 UNIT/ML MT SUSP
5.0000 mL | Freq: Four times a day (QID) | OROMUCOSAL | Status: DC
  Administered 2020-11-10 – 2020-12-03 (×93): 500000 [IU] via OROMUCOSAL
  Filled 2020-11-10 (×80): qty 5

## 2020-11-10 MED ORDER — FREE WATER
200.0000 mL | Status: DC
  Administered 2020-11-10 – 2020-11-12 (×9): 200 mL

## 2020-11-10 MED ORDER — LIP MEDEX EX OINT
TOPICAL_OINTMENT | CUTANEOUS | Status: DC | PRN
  Filled 2020-11-10: qty 7

## 2020-11-10 MED ORDER — FREE WATER
200.0000 mL | Freq: Four times a day (QID) | Status: DC
  Administered 2020-11-10: 200 mL

## 2020-11-10 NOTE — Progress Notes (Signed)
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  Had an initial stroke on 8/3, was admitted for 2 days then discharged with home health.  After about 4 days at home, he developed worsening of his stroke symptoms, return to the hospital.  In the ER, found to have enlargement of his previous stroke with microhemorrhage.  Initially had relatively mild deficits still, NIHSS 4, so his Pradaxa was held and he was admitted for SNF placement for short-term rehab.  However, he developed progressively worsening left hemiparesis, then somnolence.  Neurology were reconsulted.  Serial CT head on 8/16 showed expected evolution/progression of his stroke, further edema but no herniation or  substantial midline shift. MRI ; 8/25: increasing midline shift.  Neurology following the patient and recommended further supportive care and SNF placement.  NG tube was placed as patient lost ability to swallow.  Palliative care were consulted and in detail family discussion, with prognosis provided by neurology (more likely able to regain ability to talk and participate in self-care, unlikely to regain ability to walk, uncertain if able to regain ability to swallow, very unlikely to improve to full independence), it was decided to place PEG and pursue a SNF for rehab. Awaiting skilled nursing facility   Assessment & Plan:   Principal Problem:   Left-sided weakness Active Problems:   Stroke [facility])   Essential hypertension   Mixed hyperlipidemia   Prediabetes   History of pulmonary embolism   Chronic anticoagulation   Nicotine dependence, cigarettes, uncomplicated   Petechial hemorrhage   Acute stroke due to  ischemia [facility])   Advanced care planning/counseling discussion   Goals of care, counseling/discussion   Palliative care by specialist   1-Increase petechial hemorrhages/evolution of previous right MCA/PCA territory infarct with mild edema with now increasing midline shift on repeat MRI 10/28/2020: -MRI mild enlargement with cytotoxic edema and petechial hemorrhage with increased midline shift compared to 10/07/2020. -Neurology suspect his current poor functional status is mostly due to the swelling, with a component of this from free water and a component of this from bleeding. -He remains with significant dysphagia, PEG tube placed by IR 8/30 -Patient was a started on max for headache. -Repeat CT head 8/30 showing progressive CVA, midline shift is 10 mm as opposed to 14 mm on MRI 8/25. -Due to sinus tachycardia of unclear cause, CT head was repeated on 9/2 which showed a stable CVA and midline shift is now 6 mm. -Poor Neurological status but is stable.  Awaiting skilled nursing facility  2-HTN;  -Continue with amlodipine and hydralazine  3-Suspected  focal seizure: Presented with left hand twitching which was nonsuppressible and clinically suspicious for stroke and was a started on Keppra. Keppra Reduce 8/24 to eliminate  sedating side effect from Keppra.  Provigil  90/3.    Hypernatremia: Due to decreased free water, monitor overall stable. Discussed with Dr Erlinda Hong, ok to start correcting sodium level with free water.   Acute kidney injury on CKD stage III A: Creatinine 1.2-1.4  History of recurrent VTE: Continue with Eliquis  Dysphagia: Continue PEG tube  Lung nodule: Incidental finding of 8 mm left upper lobe nodule.  Need to follow-up CT 6 to 12 months depending on neurological progression  Possible sclerotic vertebra on imaging: Incidental finding.  PSA was 5.  Follow-up as an outpatient  Chronic diastolic heart failure: Euvolemic Hypokalemia; resolved with Lokelma Right eye  pain: With some scleral injection, case was discussed with ophthalmology Dr. Posey Pronto, will think likely related to abrasion from a stroke and it remains open.     Nutrition Problem: Inadequate oral intake Etiology: dysphagia    Signs/Symptoms: NPO status    Interventions: Tube feeding, Prostat  Estimated body mass index is 30.67 kg/m as calculated from the following:   Height as of this encounter: '5\' 5"'$  (1.651 m).   Weight as of this encounter: 83.6 kg.   DVT prophylaxis: Eliquis Code Status: DNR Family Communication: Care discussed with brother who was at bedside.  Disposition Plan:  Status is: Inpatient  Remains inpatient appropriate because:IV treatments appropriate due to intensity of illness or inability to take PO  Dispo: The patient is from: Home              Anticipated d/c is to: SNF              Patient currently is medically stable to d/c.   Difficult to place patient No        Consultants:  Neurology Palliative  Procedures:  Peg tube placement. 8/30.  Antimicrobials:  None  Subjective: Alert, moves right side.  It seems that he recognized his brother when he arrive to room.   Objective: Vitals:   11/09/20 2020 11/09/20 2339 11/10/20 0326 11/10/20 0832  BP: 128/85 125/85 127/82 129/82  Pulse: (!) 127 (!) 113 91 98  Resp: '19 18 18 14  '$ Temp: (!) 97.5 F (36.4 C) 97.7 F (36.5 C) 97.9 F (36.6 C) 98 F (36.7 C)  TempSrc: Oral Oral Oral Oral  SpO2: 100% 100% 100% 100%  Weight:      Height:        Intake/Output Summary (Last 24 hours) at 11/10/2020 1328 Last data filed at 11/10/2020 0538 Gross per 24 hour  Intake 850 ml  Output 925 ml  Net -75 ml   Filed Weights   11/06/20 0332 11/07/20 0500 11/08/20 0600  Weight: 81.9 kg 82.5 kg 83.6 kg    Examination:  General exam: Appears calm and comfortable  Respiratory system: Clear to auscultation. Respiratory effort normal. Cardiovascular system: S1 & S2 heard, RRR. Gastrointestinal  system: Abdomen is nondistended, soft and nontender. No organomegaly or masses felt. Normal bowel sounds heard. Central nervous system: Alert aphasic, left side hemiparesis.  Extremities: no edema   Data Reviewed: I have personally reviewed following labs and imaging studies  CBC: Recent Labs  Lab 11/05/20 0241 11/06/20 0314 11/07/20 0122 11/09/20 0052 11/10/20 0247  WBC 7.5 7.6 8.4 8.9 6.4  HGB 13.1 13.2 13.2 13.4 13.3  HCT 43.1 42.8 44.7 44.2 44.4  MCV 85.2 85.4 86.1 85.8 86.0  PLT 134* 141* 121* 128* [id number]*   Basic Metabolic Panel: Recent Labs  Lab 11/05/20 0241 11/06/20 0314 11/07/20 0122 11/09/20 0052 11/10/20 0247  NA 150* 152* 152* 153* 154*  K 3.9 4.0 3.7 3.9 3.7  CL 123* 124* 123* 124* 123*  CO2 19* '22 23 22 '$ 19*  GLUCOSE 147* 135* 134* 110* 169*  BUN 54* 61* 62* 65* 60*  CREATININE 1.46* 1.68* 1.61* 1.82* 1.77*  CALCIUM 9.3 9.3 9.3 9.4 9.2   GFR: Estimated Creatinine Clearance: 37.5 mL/min (A) (by C-G formula based on SCr of 1.77 mg/dL (H)). Liver Function Tests: Recent Labs  Lab 11/09/20 [phone]  AST 56*  ALT 83*  ALKPHOS 74  BILITOT 0.7  PROT 7.1  ALBUMIN 3.0*   No results for input(s): LIPASE, AMYLASE in the last 168 hours. No results for input(s): AMMONIA in the last 168 hours. Coagulation Profile: No results for input(s): INR, PROTIME in the last 168 hours. Cardiac Enzymes: No results for input(s): CKTOTAL, CKMB, CKMBINDEX, TROPONINI in the last 168 hours. BNP (last 3 results) No results for input(s): PROBNP in the last 8760 hours. HbA1C: No results for input(s): HGBA1C in the last 72 hours. CBG: Recent Labs  Lab 11/09/20 2043 11/10/20 0029 11/10/20 0404 11/10/20 0827 11/10/20 1154  GLUCAP 162* 113* 157* 132* 147*   Lipid Profile: No results for input(s): CHOL, HDL, LDLCALC, TRIG, CHOLHDL, LDLDIRECT in the last 72 hours. Thyroid Function Tests: No results for input(s): TSH, T4TOTAL, FREET4, T3FREE, THYROIDAB in the last 72  hours. Anemia Panel: No results for input(s): VITAMINB12, FOLATE, FERRITIN, TIBC, IRON, RETICCTPCT in the last 72 hours. Sepsis Labs: No results for input(s): PROCALCITON, LATICACIDVEN in the last 168 hours.  Recent Results (from the past 240 hour(s))  Surgical pcr screen     Status: None   Collection Time: 11/02/20  1:30 AM   Specimen: Nasal Mucosa; Nasal Swab  Result Value Ref Range Status   MRSA, PCR NEGATIVE NEGATIVE Final   Staphylococcus aureus NEGATIVE NEGATIVE Final    Comment: (NOTE) The Xpert SA Assay (FDA approved for NASAL specimens in patients 54 years of age and older), is one component of a comprehensive surveillance program. It is not intended to diagnose infection nor to guide or monitor treatment. Performed at [facility], [address], [place], [place] [address]          Radiology Studies: No results found.      Scheduled Meds:  amLODipine  10 mg Per Tube Daily   apixaban  5 mg Per Tube BID   atorvastatin  40 mg Per Tube Daily   erythromycin   Right Eye TID   feeding supplement (PROSource TF)  45 mL Per Tube BID   hydrALAZINE  25 mg Per Tube QID   insulin aspart  0-9 Units Subcutaneous Q4H   levETIRAcetam  250 mg Per Tube BID   mouth rinse  15 mL Mouth Rinse BID   modafinil  100 mg Per Tube Daily   nystatin  5 mL Per Tube QID   pantoprazole sodium  40 mg Per Tube Daily   topiramate  50 mg Per Tube BID   Continuous Infusions:  feeding supplement (JEVITY 1.5 CAL/FIBER) 1,000 mL (11/10/20 1053)     LOS: 27 days    Time spent: 35 minutes.     Elmarie Shiley, MD [facility]   If 7PM-7AM, please contact night-coverage [email or web address]  11/10/2020, 1:28 PM

## 2020-11-10 NOTE — Progress Notes (Signed)
Nutrition Follow-up  DOCUMENTATION CODES:  Not applicable  INTERVENTION:  Continue TF via PEG: -Jevity 1.5 @ 68m/hr (13259md)  -4594mrosource TF BID -200m47mee water Q4H   Provides 2060 kcals, 106 grams protein, 1003ml28me water (2203ml 37ml free water with flushes)  NUTRITION DIAGNOSIS:  Inadequate oral intake related to dysphagia as evidenced by NPO status. -- ongoing  GOAL:  Patient will meet greater than or equal to 90% of their needs -- met with TF  MONITOR:  TF tolerance, Weight trends, Labs, I & O's  REASON FOR ASSESSMENT:  Consult Enteral/tube feeding initiation and management  ASSESSMENT:  Pt with a PMH significant for CHF, HTN, HLD, PE, recent R MCA/PCA watershed ischemic stroke admitted with evolving/increasing size of stroke, petechial hemorrhages, cytotoxic edema, concern for new seizures.  8/19 Cortrak placed (gastric tip) 8/30 PEG placed   Per MD, pt with poor neurological status but is stable. Awaiting SNF placement. Pt remains NPO and is tolerating TF via PEG per RN. Current TF regimen: Jevity 1.5 @ 55ml/h64m5ml Pr50mrce TF BID, 200ml fre68mter Q6H  UOP: 925ml x24 46ms I/O: +3.1L since admit  Medications: protonix, SSI IVF: NS @ 50ml/hr La2mNa 154 (H), BUN 60 (H), Cr 1.77 (H) CBGs: [id number]:   Diet Order             Diet NPO time specified  Diet effective now                  EDUCATION NEEDS:  No education needs have been identified at this time  Skin:  Skin Assessment: Reviewed RN Assessment  Last BM:  9/5  Height:  Ht Readings from Last 1 Encounters:  10/14/20 _0  (1.651 m)   Weight:  Wt Readings from Last 1 Encounters:  11/08/20 83.6 kg   Ideal Body Weight:  61.82 kg  BMI:  Body mass index is 30.67 kg/m.  Estimated Nutritional Needs:  Kcal:  2000-2200 Protein:  100-120 grams Fluid:  >2L    Mellonie Guess AverLarkin InaDN (she/her/hers) RD pager number and weekend/on-call pager number located  in [email or web address]

## 2020-11-11 ENCOUNTER — Inpatient Hospital Stay (HOSPITAL_COMMUNITY): Payer: No Typology Code available for payment source

## 2020-11-11 DIAGNOSIS — R531 Weakness: Secondary | ICD-10-CM | POA: Diagnosis not present

## 2020-11-11 LAB — GLUCOSE, CAPILLARY
Glucose-Capillary: 129 mg/dL — ABNORMAL HIGH (ref 70–99)
Glucose-Capillary: 132 mg/dL — ABNORMAL HIGH (ref 70–99)
Glucose-Capillary: 135 mg/dL — ABNORMAL HIGH (ref 70–99)
Glucose-Capillary: 136 mg/dL — ABNORMAL HIGH (ref 70–99)
Glucose-Capillary: 138 mg/dL — ABNORMAL HIGH (ref 70–99)
Glucose-Capillary: 155 mg/dL — ABNORMAL HIGH (ref 70–99)
Glucose-Capillary: 96 mg/dL (ref 70–99)

## 2020-11-11 LAB — BASIC METABOLIC PANEL
Anion gap: 5 (ref 5–15)
BUN: 63 mg/dL — ABNORMAL HIGH (ref 8–23)
CO2: 20 mmol/L — ABNORMAL LOW (ref 22–32)
Calcium: 9.3 mg/dL (ref 8.9–10.3)
Chloride: 129 mmol/L — ABNORMAL HIGH (ref 98–111)
Creatinine, Ser: 1.66 mg/dL — ABNORMAL HIGH (ref 0.61–1.24)
GFR, Estimated: 44 mL/min — ABNORMAL LOW (ref >60)
Glucose, Bld: 145 mg/dL — ABNORMAL HIGH (ref 70–99)
Potassium: 4.4 mmol/L (ref 3.5–5.1)
Sodium: 154 mmol/L — ABNORMAL HIGH (ref 135–145)

## 2020-11-11 MED ORDER — SODIUM CHLORIDE 0.45 % IV SOLN: INTRAVENOUS | Status: AC

## 2020-11-11 MED ORDER — GUAIFENESIN 100 MG/5ML PO SOLN
5.0000 mL | ORAL | Status: DC | PRN
  Administered 2020-11-14 – 2020-12-14 (×2): 100 mg
  Filled 2020-11-11 (×2): qty 5

## 2020-11-11 MED ORDER — SODIUM CHLORIDE 0.45 % IV SOLN: INTRAVENOUS | Status: DC

## 2020-11-11 NOTE — Progress Notes (Signed)
Occupational Therapy Treatment Patient Details Name: Clifford [name] MRN: [id number] DOB: 03-17-1950 Today's Date: 11/11/2020    History of present illness 70 yo male presented to ED on 8/11 for R sided headache and worsening L UE weakness. Patient recently admitted 8/3-8/5 for R CVA with L visual deficits and L sided weakness. MRI with mild enlargement of R MCA/border zone infarct since 10/07/20 MRI and increased cytotoxic edema and petechial hemorrhage without midline shift. Palliative Care consulted and in detailed family discussions, with prognosis provided by Neurology (more likely able to regain ability to talk and participate in self cares, unlikely to regain ability to walk, uncertain if able to regain ability to swallow, very unlikely to improve to full independence), it was decided to place PEG and pursue SNF rehab, PEG was placed 8/30. 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   OT comments  Pt making progress with OT goals this session, as he was able to tolerate a longer session with more tasks. He requiring mod-max verbal and tactile cuing for participation/initiation, however was able to follow over 50% of commands. Pt tolerated sitting in recliner for 20 mins, working on reaching and completing grooming tasks, as well as practicing 1 sit<>stand with max A +2. Pt then required total A +2 via maximove back to bed, and max A +2 for rolling in bed to complete LB bathing. SNF is still appropriate for pt and OT will continue to follow acutely.    Follow Up Recommendations  SNF    Equipment Recommendations  None recommended by OT    Recommendations for Other Services      Precautions / Restrictions Precautions Precautions: Fall Precaution Comments: L visual field deficit, R gaze preference, L inattention, L hemparesis; G-tube and abdominal binder Restrictions Weight Bearing Restrictions: No       Mobility Bed Mobility Overal bed mobility: Needs Assistance Bed Mobility:  Rolling Rolling: Max assist;+2 for physical assistance         General bed mobility comments: Rolling to place maximove pad, then again after transfers to complete LB bathing and pericare.    Transfers Overall transfer level: Needs assistance Equipment used: Ambulation equipment used;2 person hand held assist Transfers: Sit to/from Stand Sit to Stand: Total assist;Max assist;+2 physical assistance;+2 safety/equipment         General transfer comment: Pt completed initial transfers to recliner via maximove, once in recliner, pt was able to stand 1x with max A +2 via face to face assist    Balance Overall balance assessment: Needs assistance Sitting-balance support: Bilateral upper extremity supported;Feet supported Sitting balance-Leahy Scale: Poor Sitting balance - Comments: Pt requires mod-max assist for all unsupported sitting in recliner. Postural control: Posterior lean;Left lateral lean Standing balance support: Bilateral upper extremity supported Standing balance-Leahy Scale: Zero Standing balance comment: Pt stood with max A +2, unable to engage LLE, even with tactile cuing and assisted weight bearing.                           ADL either performed or assessed with clinical judgement   ADL Overall ADL's : Needs assistance/impaired     Grooming: Wash/dry hands;Oral care;Moderate assistance;Sitting Grooming Details (indicate cue type and reason): Used a swab and a wash cloth sitting in recliner to complete oral care and handwashing. required assist to initiate bringing hand to face and verbal cueing to attend to tasks through completetion.     Lower Body Bathing: Total  assistance;+2 for physical assistance;+2 for safety/equipment;Bed level Lower Body Bathing Details (indicate cue type and reason): completed in bed with rolling to either side. Upper Body Dressing : Maximal assistance;Bed level Upper Body Dressing Details (indicate cue type and reason): Max A  to assist LUE and situate hospital gown.     Toilet Transfer: Total assistance;+2 for physical assistance;+2 for safety/equipment Toilet Transfer Details (indicate cue type and reason): Used maximove for transfer to recliner. Toileting- Clothing Manipulation and Hygiene: Total assistance;+2 for physical assistance;+2 for safety/equipment;Bed level Toileting - Clothing Manipulation Details (indicate cue type and reason): completed in bed once transferred back via maximove       General ADL Comments: Pt with increased activity tolerance this session to complete multiple ADL's. requiring max-total A +2 for all ADL's this session.     Vision       Perception     Praxis      Cognition Arousal/Alertness: Awake/alert;Lethargic Behavior During Therapy: WFL for tasks assessed/performed Overall Cognitive Status: Difficult to assess                                 General Comments: Pt opening eyes and minimally communicating verbally. Pt following 50% of commands, requiring maximal verbal and tactile cueing to keep his eyes open and participate.        Exercises     Shoulder Instructions       General Comments VSS on RA    Pertinent Vitals/ Pain       Pain Assessment: Faces Faces Pain Scale: Hurts little more Pain Location: generalized, peri-area with clean up due to raw area's Pain Descriptors / Indicators: Aching;Discomfort;Grimacing Pain Intervention(s): Monitored during session;Repositioned  Home Living                                          Prior Functioning/Environment              Frequency  Min 2X/week        Progress Toward Goals  OT Goals(current goals can now be found in the care plan section)  Progress towards OT goals: Progressing toward goals  Acute Rehab OT Goals Patient Stated Goal: None stated OT Goal Formulation: Patient unable to participate in goal setting Time For Goal Achievement: 11/25/20 Potential to  Achieve Goals: Fair ADL Goals Pt Will Perform Grooming: with mod assist;sitting;bed level Pt Will Perform Lower Body Bathing: with mod assist;bed level Pt Will Perform Lower Body Dressing: with mod assist;sitting/lateral leans Pt Will Transfer to Toilet: with mod assist Pt Will Perform Toileting - Clothing Manipulation and hygiene: with mod assist;bed level Additional ADL Goal #1: Pt will locate three grooming items in left visual field with Min cues Additional ADL Goal #2: Pt will demonstrate emergent awareness during ADLs with Min cues  Plan Discharge plan remains appropriate    Co-evaluation                 AM-PAC OT "6 Clicks" Daily Activity     Outcome Measure   Help from another person eating meals?: Total Help from another person taking care of personal grooming?: A Lot Help from another person toileting, which includes using toliet, bedpan, or urinal?: Total Help from another person bathing (including washing, rinsing, drying)?: Total Help from another person to put on and taking off regular  upper body clothing?: A Lot Help from another person to put on and taking off regular lower body clothing?: Total 6 Click Score: 8    End of Session Equipment Utilized During Treatment: Gait belt;Other (comment) (Maximove)  OT Visit Diagnosis: Unsteadiness on feet (R26.81);Muscle weakness (generalized) (M62.81);Other symptoms and signs involving cognitive function   Activity Tolerance Patient limited by lethargy   Patient Left in bed;with call Dowe/phone within reach;with bed alarm set   Nurse Communication Mobility status        Time: [id number] OT Time Calculation (min): 63 min  Charges: OT General Charges $OT Visit: 1 Visit OT Treatments $Self Care/Home Management : 23-37 mins  Iveth Heidemann H., OTR/L Acute Rehabilitation  Jerryl Holzhauer Elane Marianna Cid 11/11/2020, 4:59 PM

## 2020-11-11 NOTE — Progress Notes (Signed)
Physical Therapy Treatment Patient Details Name: Clifford [name] MRN: [id number] DOB: 06-06-1950 Today's Date: 11/11/2020    History of Present Illness 70 yo male presented to ED on 8/11 for R sided headache and worsening L UE weakness. Patient recently admitted 8/3-8/5 for R CVA with L visual deficits and L sided weakness. MRI with mild enlargement of R MCA/border zone infarct since 10/07/20 MRI and increased cytotoxic edema and petechial hemorrhage without midline shift. Palliative Care consulted and in detailed family discussions, with prognosis provided by Neurology (more likely able to regain ability to talk and participate in self cares, unlikely to regain ability to walk, uncertain if able to regain ability to swallow, very unlikely to improve to full independence), it was decided to place PEG and pursue SNF rehab, PEG was placed 8/30. 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    PT Comments    Pt received in supine, initially lethargic but once lifted to chair with totalA via mechanical lift, pt more alert and able to participate in seated/standing balance and strengthening tasks. Pt performed seated exercises, leaning /weight shifting in chair for RUE strengthening and standing trial with +2 maxA. Pt participated in chair activities ~20 minutes with eyes mostly open, an improvement in focus/alertness from previous session. Pt incontinent of bowels/bladder with transfers and would benefit from briefs next session prior to OOB, encourage RN staff to utilize Maxi-move for OOB daily if appropriate. Pt continues to benefit from PT services to progress toward functional mobility goals. May consider maxi-move to wheelchair next session if pt alert.  Follow Up Recommendations  SNF;Supervision/Assistance - 24 hour     Equipment Recommendations  Other (comment) (TBA; currently could benefit from specialized wheelchair eval, mechanical lift, and hospital bed)    Recommendations for Other Services        Precautions / Restrictions Precautions Precautions: Fall Precaution Comments: L visual field deficit, R gaze preference, L inattention, L hemparesis; G-tube and abdominal binder Restrictions Weight Bearing Restrictions: No    Mobility  Bed Mobility Overal bed mobility: Needs Assistance Bed Mobility: Rolling Rolling: Max assist;+2 for physical assistance;Total assist         General bed mobility comments: Rolling to place maximove pad, then again after transfers to complete LB bathing and pericare. TotalA to roll to R side, maxA +2 to roll to L but able to maintain LUE on rail to remain sidelying with occasional cues    Transfers Overall transfer level: Needs assistance Equipment used: Ambulation equipment used;2 person hand held assist Transfers: Sit to/from Stand Sit to Stand: Total assist;Max assist;+2 physical assistance;+2 safety/equipment         General transfer comment: Pt completed initial transfers to recliner via maximove, once in recliner, pt was able to stand 1x with max A +2 via face to face assist and L foot/knee blocked; He stood ~60-90 seconds. Bowel incontinence upon standing.      Modified Rankin (Stroke Patients Only) Modified Rankin (Stroke Patients Only) Pre-Morbid Rankin Score: Moderately severe disability Modified Rankin: Severe disability     Balance Overall balance assessment: Needs assistance Sitting-balance support: Bilateral upper extremity supported;Feet supported Sitting balance-Leahy Scale: Poor Sitting balance - Comments: Pt requires mod-max assist for all unsupported sitting in recliner but able to perform trunk anterior lean with multimodal cues and assist for weight shifting and placement/removal of gait belt. Postural control: Posterior lean;Left lateral lean Standing balance support: Bilateral upper extremity supported Standing balance-Leahy Scale: Zero Standing balance comment: Pt stood  with max A +2, unable to engage LLE,  even with tactile cuing and assisted weight bearing.                            Cognition Arousal/Alertness: Awake/alert;Lethargic Behavior During Therapy: WFL for tasks assessed/performed Overall Cognitive Status: Difficult to assess                         Following Commands: Follows one step commands inconsistently;Follows one step commands with increased time Safety/Judgement: Decreased awareness of deficits;Decreased awareness of safety Awareness: Intellectual Problem Solving: Slow processing;Difficulty sequencing;Requires verbal cues;Requires tactile cues General Comments: Pt opening eyes and minimally communicating verbally. Pt following 50% of commands, requiring maximal verbal and tactile cueing to keep his eyes open and participate, but able to tolerate more activity this session once lifted to chair/more alert while seated      Exercises Other Exercises Other Exercises: seated RLE AAROM: hip flexion, LAQ x10 reps ea Other Exercises: anterior/posterior trunk lean in chair x5 reps Other Exercises: STS x 1 rep for BLE strengthening Other Exercises: RUE gross grasp x 10 reps    General Comments General comments (skin integrity, edema, etc.): VSS on RA, pt unable to indicate whether he was dizzy with transitions due to cognition/aphasia but no acute s/sx distress. Pt incontinent of bowels/bladder and expressed discomfort during peri-care.      Pertinent Vitals/Pain Pain Assessment: Faces Faces Pain Scale: Hurts little more Pain Location: generalized, peri-area with clean up due to raw areas Pain Descriptors / Indicators: Aching;Discomfort;Grimacing Pain Intervention(s): Limited activity within patient's tolerance;Monitored during session;Repositioned;Other (comment) Scientist, forensic present and notified pt may need something to protect skin around perineal area, needs new primofit placed to avoid maceration)     PT Goals (current goals can now be found in the  care plan section) Acute Rehab PT Goals Patient Stated Goal: None stated Progress towards PT goals: Progressing toward goals    Frequency    Min 3X/week      PT Plan Current plan remains appropriate    Co-evaluation PT/OT/SLP Co-Evaluation/Treatment: Yes Reason for Co-Treatment: Complexity of the patient's impairments (multi-system involvement);Necessary to address cognition/behavior during functional activity;For patient/therapist safety;To address functional/ADL transfers PT goals addressed during session: Mobility/safety with mobility;Balance;Strengthening/ROM        AM-PAC PT "6 Clicks" Mobility   Outcome Measure  Help needed turning from your back to your side while in a flat bed without using bedrails?: A Lot Help needed moving from lying on your back to sitting on the side of a flat bed without using bedrails?: Total Help needed moving to and from a bed to a chair (including a wheelchair)?: Total Help needed standing up from a chair using your arms (e.g., wheelchair or bedside chair)?: Total Help needed to walk in hospital room?: Total Help needed climbing 3-5 steps with a railing? : Total 6 Click Score: 7    End of Session Equipment Utilized During Treatment: Gait belt Activity Tolerance: Patient tolerated treatment well;Patient limited by lethargy Patient left: in bed;with call [place]/phone within reach;with bed alarm set;with family/visitor present Nurse Communication: Mobility status;Other (comment) (pt needs new primo fit placed) PT Visit Diagnosis: Unsteadiness on feet (R26.81);Muscle weakness (generalized) (M62.81);Other symptoms and signs involving the nervous system (R29.898);Other abnormalities of gait and mobility (R26.89)     Time: [id number] PT Time Calculation (min) (ACUTE ONLY): 62 min  Charges:  $Therapeutic Exercise: 8-22 mins $Therapeutic Activity:  8-22 mins                     Amoria Mclees P., PTA Acute Rehabilitation Services Pager:  [phone] Office: [place] 11/11/2020, 6:12 PM

## 2020-11-11 NOTE — Progress Notes (Addendum)
  Speech Language Pathology Treatment: Dysphagia;Cognitive-Linquistic  Patient Details Name: Clifford [name] MRN: [id number] DOB: 06-28-1950 Today's Date: 11/11/2020 Time: [id number] SLP Time Calculation (min) (ACUTE ONLY): 27 min  Assessment / Plan / Recommendation Clinical Impression  Pt seen for dysphagia/dysarthria tx with improvement overall with mentation.  Pt oriented x4 (with min A for date), speaking in simple conversation  and interacting appropriately via responding and answering personal information questions with 90% accuracy, but reduced intelligibility noted as pt was 50-75% intelligible throughout session without use of compensatory strategies.   Left neglect continues to persist with visual/verbal cues (mod) needed to attend to left side during tx). Pt given compensatory strategies for dysarthria with mod verbal cues required for utilizing these in smple conversation with MD/nurse during session including increased vocal intensity, overarticulation and slow rate.      Pt consumed ice chips/1/3 tsp amounts of thin with delayed cough intiiated with thin via tsp and oral holding/eventual anterior loss of left (min).  Ice chips with increased oral manipulation/awareness of bolus improvement overall. Continued ST recommended for PO trials/dysarthria and continued cognitive assessment/tx.   HPI HPI: 70yo male admitted 10/14/20 with right side headache and LUE weakness. 10/19/20 pt was noted to have increased LUE weakness. 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 CTHead = evolving RMCA infarct with cytotoxic edema, mass effect      SLP Plan  Continue with current plan of care       Recommendations  Diet recommendations: NPO Medication Administration: Via alternative means                Oral Care Recommendations: Oral care QID Follow up Recommendations: Skilled Nursing facility SLP Visit Diagnosis: Dysphagia,  oropharyngeal phase (R13.12);Dysarthria and anarthria (R47.1) Plan: Continue with current plan of care                       Elvina Sidle, M.S., CCC-SLP 11/11/2020, 11:54 AM

## 2020-11-11 NOTE — Progress Notes (Signed)
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  Had an initial stroke on 8/3, was admitted for 2 days then discharged with home health.  After about 4 days at home, he developed worsening of his stroke symptoms, return to the hospital.  In the ER, found to have enlargement of his previous stroke with microhemorrhage.  Initially had relatively mild deficits still, NIHSS 4, so his Pradaxa was held and he was admitted for SNF placement for short-term rehab.  However, he developed progressively worsening left hemiparesis, then somnolence.  Neurology were reconsulted.  Serial CT head on 8/16 showed expected evolution/progression of his stroke, further edema but no herniation or  substantial midline shift. MRI ; 8/25: increasing midline shift.  Neurology following the patient and recommended further supportive care and SNF placement.  NG tube was placed as patient lost ability to swallow.  Palliative care were consulted and in detail family discussion, with prognosis provided by neurology (more likely able to regain ability to talk and participate in self-care, unlikely to regain ability to walk, uncertain if able to regain ability to swallow, very unlikely to improve to full independence), it was decided to place PEG and pursue a SNF for rehab.  Awaiting skilled nursing facility   Assessment & Plan:   Principal Problem:   Left-sided weakness Active Problems:   Stroke [facility])   Essential hypertension   Mixed hyperlipidemia   Prediabetes   History of pulmonary embolism   Chronic anticoagulation   Nicotine dependence, cigarettes, uncomplicated   Petechial hemorrhage   Acute stroke due to  ischemia [facility])   Advanced care planning/counseling discussion   Goals of care, counseling/discussion   Palliative care by specialist   1-Increase petechial hemorrhages/evolution of previous right MCA/PCA territory infarct with mild edema with now increasing midline shift on repeat MRI 10/28/2020: -MRI mild enlargement with cytotoxic edema and petechial hemorrhage with increased midline shift compared to 10/07/2020. -Neurology suspect his current poor functional status is mostly due to the swelling, with a component of this from free water and a component of this from bleeding. -He remains with significant dysphagia, PEG tube placed by IR 8/30 -Patient was a started on max for headache. -Repeat CT head 8/30 showing progressive CVA, midline shift is 10 mm as opposed to 14 mm on MRI 8/25. -Due to sinus tachycardia of unclear cause, CT head was repeated on 9/2 which showed a stable CVA and midline shift is now 6 mm. Awaiting skilled nursing facility He is more alert today, he was able to vocalized words and voice was stronger.   2-HTN;  -Continue with amlodipine and hydralazine  3-Suspected  focal seizure: Presented with left hand twitching which was nonsuppressible and clinically suspicious for stroke and was a started on Keppra. Keppra Reduce 8/24 to eliminate  sedating side effect from Keppra.  Provigil  90/3.   Cough; Will get Chest x ray.   Hypernatremia: Due to decreased free water, monitor overall stable. Discussed with Dr Erlinda Hong, ok to start correcting sodium level with free water.  Will add half NS for 8 hours.  Repeat Bmet tomorrow.   Acute kidney injury on CKD stage III A: Creatinine 1.2-1.4 Cr down to 1.6. monitor.   History of  recurrent VTE: Continue with Eliquis  Dysphagia: Continue PEG tube  Lung nodule: Incidental finding of 8 mm left upper lobe nodule.  Need to follow-up CT 6 to 12 months depending on neurological progression  Possible sclerotic vertebra on  imaging: Incidental finding.  PSA was 5.  Follow-up as an outpatient  Chronic diastolic heart failure: Euvolemic Hyperkalemia; resolved with Lokelma Right eye pain: With some scleral injection, case was discussed with ophthalmology Dr. Posey Pronto, will think likely related to abrasion from a stroke and it remains open.     Nutrition Problem: Inadequate oral intake Etiology: dysphagia    Signs/Symptoms: NPO status    Interventions: Tube feeding, Prostat  Estimated body mass index is 30.67 kg/m as calculated from the following:   Height as of this encounter: '5\' 5"'$  (1.651 m).   Weight as of this encounter: 83.6 kg.   DVT prophylaxis: Eliquis Code Status: DNR Family Communication: Care discussed with brother who was at bedside 8/07. Disposition Plan:  Status is: Inpatient  Remains inpatient appropriate because:IV treatments appropriate due to intensity of illness or inability to take PO  Dispo: The patient is from: Home              Anticipated d/c is to: SNF              Patient currently is medically stable to d/c.   Difficult to place patient No        Consultants:  Neurology Palliative  Procedures:  Peg tube placement. 8/30.  Antimicrobials:  None  Subjective: He is alert today, moves right side.  He has productive cough.  He vocalizes word today and was able to tell me his name, age, and that he had stroke   Objective: Vitals:   11/10/20 1956 11/11/20 0031 11/11/20 0732 11/11/20 1107  BP: 118/88 131/84 132/78 115/68  Pulse: 94 89 89 79  Resp: '16 14 14   '$ Temp: 97.9 F (36.6 C) 98.4 F (36.9 C) 98.5 F (36.9 C) 98.5 F (36.9 C)  TempSrc: Oral Oral Oral Oral  SpO2: 100% 100% 100% 100%  Weight:      Height:        Intake/Output Summary (Last 24 hours) at 11/11/2020 1314 Last data filed at 11/11/2020 0730 Gross per 24 hour  Intake 1670 ml  Output 2100 ml  Net -430 ml    Filed Weights   11/06/20 0332 11/07/20 0500 11/08/20 0600  Weight: 81.9  kg 82.5 kg 83.6 kg    Examination:  General exam: NAD Respiratory system: BL ronchus Cardiovascular system: S, 1, S 2 RRR Gastrointestinal system: BS present, soft, nt Central nervous system: Alert Extremities:No edema   Data Reviewed: I have personally reviewed following labs and imaging studies  CBC: Recent Labs  Lab 11/05/20 0241 11/06/20 0314 11/07/20 0122 11/09/20 0052 11/10/20 0247  WBC 7.5 7.6 8.4 8.9 6.4  HGB 13.1 13.2 13.2 13.4 13.3  HCT 43.1 42.8 44.7 44.2 44.4  MCV 85.2 85.4 86.1 85.8 86.0  PLT 134* 141* 121* 128* 127*    Basic Metabolic Panel: Recent Labs  Lab 11/06/20 0314 11/07/20 0122 11/09/20 0052 11/10/20 0247 11/11/20 0113  NA 152* 152* 153* 154* 154*  K 4.0 3.7 3.9 3.7 4.4  CL 124* 123* 124* 123* 129*  CO2 '22 23 22 '$ 19* 20*  GLUCOSE 135* 134* 110* 169* 145*  BUN 61* 62* 65* 60* 63*  CREATININE 1.68* 1.61* 1.82* 1.77* 1.66*  CALCIUM 9.3 9.3 9.4 9.2 9.3    GFR:  Estimated Creatinine Clearance: 40 mL/min (A) (by C-G formula based on SCr of 1.66 mg/dL (H)). Liver Function Tests: Recent Labs  Lab 11/09/20 0052  AST 56*  ALT 83*  ALKPHOS 74  BILITOT 0.7  PROT 7.1  ALBUMIN 3.0*    No results for input(s): LIPASE, AMYLASE in the last 168 hours. No results for input(s): AMMONIA in the last 168 hours. Coagulation Profile: No results for input(s): INR, PROTIME in the last 168 hours. Cardiac Enzymes: No results for input(s): CKTOTAL, CKMB, CKMBINDEX, TROPONINI in the last 168 hours. BNP (last 3 results) No results for input(s): PROBNP in the last 8760 hours. HbA1C: No results for input(s): HGBA1C in the last 72 hours. CBG: Recent Labs  Lab 11/10/20 1958 11/11/20 0033 11/11/20 0458 11/11/20 0732 11/11/20 1142  GLUCAP 118* 132* 135* 138* 136*    Lipid Profile: No results for input(s): CHOL, HDL, LDLCALC, TRIG, CHOLHDL, LDLDIRECT in the last 72 hours. Thyroid Function Tests: No results for input(s): TSH, T4TOTAL, FREET4, T3FREE,  THYROIDAB in the last 72 hours. Anemia Panel: No results for input(s): VITAMINB12, FOLATE, FERRITIN, TIBC, IRON, RETICCTPCT in the last 72 hours. Sepsis Labs: No results for input(s): PROCALCITON, LATICACIDVEN in the last 168 hours.  Recent Results (from the past 240 hour(s))  Surgical pcr screen     Status: None   Collection Time: 11/02/20  1:30 AM   Specimen: Nasal Mucosa; Nasal Swab  Result Value Ref Range Status   MRSA, PCR NEGATIVE NEGATIVE Final   Staphylococcus aureus NEGATIVE NEGATIVE Final    Comment: (NOTE) The Xpert SA Assay (FDA approved for NASAL specimens in patients 22 years of age and older), is one component of a comprehensive surveillance program. It is not intended to diagnose infection nor to guide or monitor treatment. Performed at [facility], [place] [address] [place], [place] [address]           Radiology Studies: No results found.      Scheduled Meds:  amLODipine  10 mg Per Tube Daily   apixaban  5 mg Per Tube BID   atorvastatin  40 mg Per Tube Daily   erythromycin   Right Eye TID   feeding supplement (PROSource TF)  45 mL Per Tube BID   free water  200 mL Per Tube Q4H   hydrALAZINE  25 mg Per Tube QID   insulin aspart  0-9 Units Subcutaneous Q4H   levETIRAcetam  250 mg Per Tube BID   mouth rinse  15 mL Mouth Rinse BID   modafinil  100 mg Per Tube Daily   nystatin  5 mL Mouth/Throat QID   pantoprazole sodium  40 mg Per Tube Daily   topiramate  50 mg Per Tube BID   Continuous Infusions:  sodium chloride     feeding supplement (JEVITY 1.5 CAL/FIBER) 1,000 mL (11/10/20 1053)     LOS: 28 days    Time spent: 35 minutes.     Elmarie Shiley, MD [facility]   If 7PM-7AM, please contact night-coverage [email or web address]  11/11/2020, 1:14 PM

## 2020-11-11 NOTE — Plan of Care (Signed)
  Problem: Clinical Measurements: Goal: Ability to maintain clinical measurements within normal limits will improve Outcome: Progressing   Problem: Clinical Measurements: Goal: Diagnostic test results will improve Outcome: Progressing   Problem: Clinical Measurements: Goal: Respiratory complications will improve Outcome: Progressing   Problem: Clinical Measurements: Goal: Cardiovascular complication will be avoided Outcome: Progressing   Problem: Nutrition: Goal: Adequate nutrition will be maintained Outcome: Progressing   Problem: Elimination: Goal: Will not experience complications related to bowel motility Outcome: Progressing   Problem: Pain Managment: Goal: General experience of comfort will improve Outcome: Progressing   Problem: Safety: Goal: Ability to remain free from injury will improve Outcome: Progressing   Problem: Skin Integrity: Goal: Risk for impaired skin integrity will decrease Outcome: Progressing   Problem: Intracerebral Hemorrhage Tissue Perfusion: Goal: Complications of Intracerebral Hemorrhage will be minimized Outcome: Progressing   Problem: Spontaneous Subarachnoid Hemorrhage Tissue Perfusion: Goal: Complications of Spontaneous Subarachnoid Hemorrhage will be minimized Outcome: Progressing

## 2020-11-12 DIAGNOSIS — R531 Weakness: Secondary | ICD-10-CM | POA: Diagnosis not present

## 2020-11-12 LAB — HEPATIC FUNCTION PANEL
ALT: 134 U/L — ABNORMAL HIGH (ref 0–44)
AST: 97 U/L — ABNORMAL HIGH (ref 15–41)
Albumin: 2.5 g/dL — ABNORMAL LOW (ref 3.5–5.0)
Alkaline Phosphatase: 69 U/L (ref 38–126)
Bilirubin, Direct: 0.2 mg/dL (ref 0.0–0.2)
Indirect Bilirubin: 0.4 mg/dL (ref 0.3–0.9)
Total Bilirubin: 0.6 mg/dL (ref 0.3–1.2)
Total Protein: 6.3 g/dL — ABNORMAL LOW (ref 6.5–8.1)

## 2020-11-12 LAB — GLUCOSE, CAPILLARY
Glucose-Capillary: 124 mg/dL — ABNORMAL HIGH (ref 70–99)
Glucose-Capillary: 142 mg/dL — ABNORMAL HIGH (ref 70–99)
Glucose-Capillary: 112 mg/dL — ABNORMAL HIGH (ref 70–99)
Glucose-Capillary: 146 mg/dL — ABNORMAL HIGH (ref 70–99)
Glucose-Capillary: 124 mg/dL — ABNORMAL HIGH (ref 70–99)
Glucose-Capillary: 113 mg/dL — ABNORMAL HIGH (ref 70–99)

## 2020-11-12 LAB — CBC
HCT: 40.4 % (ref 39.0–52.0)
Hemoglobin: 12.4 g/dL — ABNORMAL LOW (ref 13.0–17.0)
MCH: 26.2 pg (ref 26.0–34.0)
MCHC: 30.7 g/dL (ref 30.0–36.0)
MCV: 85.4 fL (ref 80.0–100.0)
Platelets: 92 10*3/uL — ABNORMAL LOW (ref 150–400)
RBC: 4.73 MIL/uL (ref 4.22–5.81)
RDW: 15.7 % — ABNORMAL HIGH (ref 11.5–15.5)
WBC: 7.6 10*3/uL (ref 4.0–10.5)
nRBC: 0 % (ref 0.0–0.2)

## 2020-11-12 LAB — BASIC METABOLIC PANEL
Anion gap: 6 (ref 5–15)
BUN: 54 mg/dL — ABNORMAL HIGH (ref 8–23)
CO2: 21 mmol/L — ABNORMAL LOW (ref 22–32)
Calcium: 8.7 mg/dL — ABNORMAL LOW (ref 8.9–10.3)
Chloride: 124 mmol/L — ABNORMAL HIGH (ref 98–111)
Creatinine, Ser: 1.7 mg/dL — ABNORMAL HIGH (ref 0.61–1.24)
GFR, Estimated: 42 mL/min — ABNORMAL LOW (ref >60)
Glucose, Bld: 130 mg/dL — ABNORMAL HIGH (ref 70–99)
Potassium: 3.9 mmol/L (ref 3.5–5.1)
Sodium: 151 mmol/L — ABNORMAL HIGH (ref 135–145)

## 2020-11-12 MED ORDER — METOPROLOL TARTRATE 12.5 MG HALF TABLET
12.5000 mg | ORAL_TABLET | Freq: Two times a day (BID) | ORAL | Status: DC
  Administered 2020-11-12 – 2020-11-30 (×37): 12.5 mg
  Filled 2020-11-12 (×37): qty 1

## 2020-11-12 MED ORDER — FREE WATER
250.0000 mL | Status: DC
  Administered 2020-11-12 – 2020-11-15 (×20): 250 mL

## 2020-11-12 MED ORDER — SODIUM CHLORIDE 0.45 % IV SOLN: INTRAVENOUS | Status: DC

## 2020-11-12 NOTE — Progress Notes (Signed)
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  Had an initial stroke on 8/3, was admitted for 2 days then discharged with home health.  After about 4 days at home, he developed worsening of his stroke symptoms, return to the hospital.  In the ER, found to have enlargement of his previous stroke with microhemorrhage.  Initially had relatively mild deficits still, NIHSS 4, so his Pradaxa was held and he was admitted for SNF placement for short-term rehab.  However, he developed progressively worsening left hemiparesis, then somnolence.  Neurology were reconsulted.  Serial CT head on 8/16 showed expected evolution/progression of his stroke, further edema but no herniation or  substantial midline shift. MRI ; 8/25: increasing midline shift.  Neurology following the patient and recommended further supportive care and SNF placement.  NG tube was placed as patient lost ability to swallow.  Palliative care were consulted and in detail family discussion, with prognosis provided by neurology (more likely able to regain ability to talk and participate in self-care, unlikely to regain ability to walk, uncertain if able to regain ability to swallow, very unlikely to improve to full independence), it was decided to place PEG and pursue a SNF for rehab.  Awaiting skilled nursing facility   Assessment & Plan:   Principal Problem:   Left-sided weakness Active Problems:   Stroke [facility])   Essential hypertension   Mixed hyperlipidemia   Prediabetes   History of pulmonary embolism   Chronic anticoagulation   Nicotine dependence, cigarettes, uncomplicated   Petechial hemorrhage   Acute stroke due to  ischemia [facility])   Advanced care planning/counseling discussion   Goals of care, counseling/discussion   Palliative care by specialist   1-Increase petechial hemorrhages/evolution of previous right MCA/PCA territory infarct with mild edema with now increasing midline shift on repeat MRI 10/28/2020: -MRI mild enlargement with cytotoxic edema and petechial hemorrhage with increased midline shift compared to 10/07/2020. -Neurology suspect his current poor functional status is mostly due to the swelling, with a component of this from free water and a component of this from bleeding. -He remains with significant dysphagia, PEG tube placed by IR 8/30 -Patient was a started on max for headache. -Repeat CT head 8/30 showing progressive CVA, midline shift is 10 mm as opposed to 14 mm on MRI 8/25. -Due to sinus tachycardia of unclear cause, CT head was repeated on 9/2 which showed a stable CVA and midline shift is now 6 mm. Awaiting skilled nursing facility He is more alert today, he was able to vocalized words and voice was stronger.   2-HTN, Tachycardia:   -Continue with amlodipine and hydralazine -start Metoprolol.   3-Suspected  focal seizure: Presented with left hand twitching which was nonsuppressible and clinically suspicious for stroke and was a started on Keppra. Keppra Reduce 8/24 to eliminate  sedating side effect from Keppra.  Provigil  90/3.   Cough; Chest x ray no active disease.  Will order flutter valve  Hypernatremia: Due to decreased free water, monitor overall stable. Discussed with Dr Erlinda Hong, ok to start correcting sodium level with free water.  Continue with half NS .   Acute kidney injury on CKD stage III A: Creatinine 1.2-1.4 Monitor cr.  History of  recurrent VTE: Continue with Eliquis.  Dysphagia: Continue PEG tube  Lung nodule: Incidental finding of 8 mm left upper lobe nodule.  Need to follow-up CT 6 to 12 months depending on neurological progression  Possible  sclerotic vertebra on imaging: Incidental finding.  PSA was 5.  Follow-up as an outpatient  Chronic diastolic heart failure: Euvolemic Hyperkalemia; Resolved with Lokelma Right eye pain: With some scleral injection, case was discussed with ophthalmology Dr. Posey Pronto, will think likely related to abrasion from a stroke and it remains open.     Nutrition Problem: Inadequate oral intake Etiology: dysphagia    Signs/Symptoms: NPO status    Interventions: Tube feeding, Prostat  Estimated body mass index is 31.29 kg/m as calculated from the following:   Height as of this encounter: '5\' 5"'$  (1.651 m).   Weight as of this encounter: 85.3 kg.   DVT prophylaxis: Eliquis Code Status: DNR Family Communication: Care discussed with brother who was at bedside 9/09. Disposition Plan:  Status is: Inpatient  Remains inpatient appropriate because:IV treatments appropriate due to intensity of illness or inability to take PO  Dispo: The patient is from: Home              Anticipated d/c is to: SNF              Patient currently is medically stable to d/c.   Difficult to place patient No        Consultants:  Neurology Palliative  Procedures:  Peg tube placement. 8/30.  Antimicrobials:  None  Subjective: He is alert, answer a few questions.  He said my hands are cold. He said his brother name.     Objective: Vitals:   11/12/20 0343 11/12/20 0348 11/12/20 0725 11/12/20 1142  BP: (!) 150/75  (!) 162/89 (!) 149/81  Pulse: 94  76 (!) 110  Resp: '20  14 14  '$ Temp: (!) 97.1 F (36.2 C)  98.6 F (37 C) 99.4 F (37.4 C)  TempSrc: Axillary  Oral Oral  SpO2: 100%  100% 100%  Weight:  85.3 kg    Height:        Intake/Output Summary (Last 24 hours) at 11/12/2020 1416 Last data filed at 11/12/2020 0347 Gross per 24 hour  Intake 375 ml  Output 550 ml  Net -175 ml    Filed Weights   11/07/20 0500 11/08/20 0600 11/12/20 0348  Weight: 82.5 kg 83.6 kg 85.3 kg     Examination:  General exam: NAD Respiratory system: BL [place] Cardiovascular system: S 1, S 2 RRR Gastrointestinal system: BS present, soft, nt  Central nervous system: alert,left side hemiparesis.  Extremities: No edema   Data Reviewed: I have personally reviewed following labs and imaging studies  CBC: Recent Labs  Lab 11/06/20 0314 11/07/20 0122 11/09/20 0052 11/10/20 0247 11/12/20 0304  WBC 7.6 8.4 8.9 6.4 7.6  HGB 13.2 13.2 13.4 13.3 12.4*  HCT 42.8 44.7 44.2 44.4 40.4  MCV 85.4 86.1 85.8 86.0 85.4  PLT 141* 121* 128* 127* 92*    Basic Metabolic Panel: Recent Labs  Lab 11/07/20 0122 11/09/20 0052 11/10/20 0247 11/11/20 0113 11/12/20 0304  NA 152* 153* 154* 154* 151*  K 3.7 3.9 3.7 4.4 3.9  CL 123* 124* 123* 129* 124*  CO2 23 22 19* 20* 21*  GLUCOSE 134* 110* 169* 145* 130*  BUN 62* 65* 60* 63* 54*  CREATININE 1.61* 1.82* 1.77* 1.66* 1.70*  CALCIUM 9.3 9.4 9.2 9.3 8.7*    GFR: Estimated Creatinine  Clearance: 39.4 mL/min (A) (by C-G formula based on SCr of 1.7 mg/dL (H)). Liver Function Tests: Recent Labs  Lab 11/09/20 0052 11/12/20 0304  AST 56* 97*  ALT 83* 134*  ALKPHOS 74 69  BILITOT 0.7 0.6  PROT 7.1 6.3*  ALBUMIN 3.0* 2.5*    No results for input(s): LIPASE, AMYLASE in the last 168 hours. No results for input(s): AMMONIA in the last 168 hours. Coagulation Profile: No results for input(s): INR, PROTIME in the last 168 hours. Cardiac Enzymes: No results for input(s): CKTOTAL, CKMB, CKMBINDEX, TROPONINI in the last 168 hours. BNP (last 3 results) No results for input(s): PROBNP in the last 8760 hours. HbA1C: No results for input(s): HGBA1C in the last 72 hours. CBG: Recent Labs  Lab 11/11/20 2127 11/11/20 2353 11/12/20 0345 11/12/20 0733 11/12/20 1146  GLUCAP 155* 129* 124* 142* 112*    Lipid Profile: No results for input(s): CHOL, HDL, LDLCALC, TRIG, CHOLHDL, LDLDIRECT in the last 72 hours. Thyroid Function Tests: No  results for input(s): TSH, T4TOTAL, FREET4, T3FREE, THYROIDAB in the last 72 hours. Anemia Panel: No results for input(s): VITAMINB12, FOLATE, FERRITIN, TIBC, IRON, RETICCTPCT in the last 72 hours. Sepsis Labs: No results for input(s): PROCALCITON, LATICACIDVEN in the last 168 hours.  No results found for this or any previous visit (from the past 240 hour(s)).        Radiology Studies: DG CHEST PORT 1 VIEW  Result Date: 11/11/2020 CLINICAL DATA:  Cough. EXAM: PORTABLE CHEST 1 VIEW COMPARISON:  None. FINDINGS: The heart size and mediastinal contours are within normal limits. Both lungs are clear. The visualized skeletal structures are unremarkable. IMPRESSION: No active disease. Electronically Signed   By: Marijo Conception M.D.   On: 11/11/2020 15:44        Scheduled Meds:  amLODipine  10 mg Per Tube Daily   apixaban  5 mg Per Tube BID   atorvastatin  40 mg Per Tube Daily   erythromycin   Right Eye TID   feeding supplement (PROSource TF)  45 mL Per Tube BID   free water  250 mL Per Tube Q4H   hydrALAZINE  25 mg Per Tube QID   insulin aspart  0-9 Units Subcutaneous Q4H   levETIRAcetam  250 mg Per Tube BID   mouth rinse  15 mL Mouth Rinse BID   metoprolol tartrate  12.5 mg Oral BID   modafinil  100 mg Per Tube Daily   nystatin  5 mL Mouth/Throat QID   pantoprazole sodium  40 mg Per Tube Daily   topiramate  50 mg Per Tube BID   Continuous Infusions:  sodium chloride 75 mL/hr at 11/12/20 0826   feeding supplement (JEVITY 1.5 CAL/FIBER) 1,000 mL (11/12/20 0346)     LOS: 29 days    Time spent: 35 minutes.     Elmarie Shiley, MD [facility]   If 7PM-7AM, please contact night-coverage [email or web address]  11/12/2020, 2:16 PM

## 2020-11-12 NOTE — Progress Notes (Signed)
Physical Therapy Treatment Patient Details Name: Clifford [name] MRN: [id number] DOB: Jun 25, 1950 Today's Date: 11/12/2020    History of Present Illness 70 yo male presented to ED on 8/11 for R sided headache and worsening L UE weakness. Patient recently admitted 8/3-8/5 for R CVA with L visual deficits and L sided weakness. MRI with mild enlargement of R MCA/border zone infarct since 10/07/20 MRI and increased cytotoxic edema and petechial hemorrhage without midline shift. Palliative Care consulted and in detailed family discussions, with prognosis provided by Neurology (more likely able to regain ability to talk and participate in self cares, unlikely to regain ability to walk, uncertain if able to regain ability to swallow, very unlikely to improve to full independence), it was decided to place PEG and pursue SNF rehab, PEG was placed 8/30. 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    PT Comments    Pt received in supine, lethargic and difficult to rouse, pt not opening eyes this date but at times nodding/shaking his head in response to simple yes/no questions and grasping therapist hand/bed rail with tactile cues. Emphasis on repositioning for pressure relief, RUE AAROM, and hygiene assist to prevent moisture-related skin breakdown. Pt with some skin breakdown noted in perineal area, NT/RN aware. Pt continues to benefit from PT services to progress toward functional mobility goals.   Follow Up Recommendations  SNF;Supervision/Assistance - 24 hour     Equipment Recommendations  Other (comment) (TBA; currently could benefit from specialized wheelchair eval, mechanical lift, and hospital bed)    Recommendations for Other Services       Precautions / Restrictions Precautions Precautions: Fall Precaution Comments: L visual field deficit, R gaze preference, L inattention, L hemparesis; G-tube and abdominal binder; sacral/penile skin breakdown from long term condom cath  use/moisture Restrictions Weight Bearing Restrictions: No    Mobility  Bed Mobility Overal bed mobility: Needs Assistance Bed Mobility: Rolling Rolling: +2 for physical assistance;Total assist         General bed mobility comments: Rolling for pressure relief/repositioning and hygiene assist, pt able to grasp L bed rail today with max cues using RUE but unable to initiate/assist with rolling to L side; totalA to roll to R side due to L hemiplegia    Transfers                    Ambulation/Gait                 Stairs             Wheelchair Mobility    Modified Rankin (Stroke Patients Only) Modified Rankin (Stroke Patients Only) Pre-Morbid Rankin Score: Moderately severe disability Modified Rankin: Severe disability     Balance                                            Cognition Arousal/Alertness: Lethargic Behavior During Therapy: WFL for tasks assessed/performed Overall Cognitive Status: Difficult to assess                         Following Commands: Follows one step commands inconsistently;Follows one step commands with increased time Safety/Judgement: Decreased awareness of deficits;Decreased awareness of safety Awareness: Intellectual Problem Solving: Slow processing;Difficulty sequencing;Requires verbal cues;Requires tactile cues;Decreased initiation General Comments: Pt more lethargic today, maintains closed eyes even with clapping near head and  loud voice speaking to him; some nods yes/no appropriately but frequently falling asleep; able to grasp with RUE on L bedrail with cues      Exercises Other Exercises Other Exercises: RUE AA/PROM: shoulder flexion, elbow flex/ext, PROM wrist flex/ext x10 reps ea Other Exercises: gross grasp x10 reps RUE AROM with cues    General Comments General comments (skin integrity, edema, etc.): VSS on RA, no acute s/sx distress      Pertinent Vitals/Pain Pain Assessment:  Faces Faces Pain Scale: Hurts little more Pain Location: generalized, peri-area with clean up due to raw areas Pain Descriptors / Indicators: Grimacing;Guarding Pain Intervention(s): Limited activity within patient's tolerance;Monitored during session;Repositioned    Home Living                      Prior Function            PT Goals (current goals can now be found in the care plan section) Acute Rehab PT Goals Patient Stated Goal: None stated PT Goal Formulation: With patient Time For Goal Achievement: 11/26/20 Potential to Achieve Goals: Poor Progress towards PT goals: Progressing toward goals    Frequency    Min 3X/week      PT Plan Current plan remains appropriate    Co-evaluation              AM-PAC PT "6 Clicks" Mobility   Outcome Measure  Help needed turning from your back to your side while in a flat bed without using bedrails?: Total Help needed moving from lying on your back to sitting on the side of a flat bed without using bedrails?: Total Help needed moving to and from a bed to a chair (including a wheelchair)?: Total Help needed standing up from a chair using your arms (e.g., wheelchair or bedside chair)?: Total Help needed to walk in hospital room?: Total Help needed climbing 3-5 steps with a railing? : Total 6 Click Score: 6    End of Session   Activity Tolerance: Patient limited by lethargy Patient left: in bed;with call [place]/phone within reach;with bed alarm set Nurse Communication: Mobility status;Other (comment);Need for lift equipment (pt with sacral/penile skin breakdown) PT Visit Diagnosis: Unsteadiness on feet (R26.81);Muscle weakness (generalized) (M62.81);Other symptoms and signs involving the nervous system (R29.898);Other abnormalities of gait and mobility (R26.89)     Time: 1710-1726 PT Time Calculation (min) (ACUTE ONLY): 16 min  Charges:  $Therapeutic Activity: 8-22 mins                     Miliana Gangwer P., PTA Acute  Rehabilitation Services Pager: [phone] Office: [place] 11/12/2020, 5:48 PM

## 2020-11-13 ENCOUNTER — Inpatient Hospital Stay (HOSPITAL_COMMUNITY): Payer: No Typology Code available for payment source

## 2020-11-13 DIAGNOSIS — R531 Weakness: Secondary | ICD-10-CM | POA: Diagnosis not present

## 2020-11-13 LAB — MRSA NEXT GEN BY PCR, NASAL: MRSA by PCR Next Gen: NOT DETECTED

## 2020-11-13 LAB — URINALYSIS, ROUTINE W REFLEX MICROSCOPIC
Bilirubin Urine: NEGATIVE
Glucose, UA: NEGATIVE mg/dL
Hgb urine dipstick: NEGATIVE
Ketones, ur: NEGATIVE mg/dL
Leukocytes,Ua: NEGATIVE
Nitrite: NEGATIVE
Protein, ur: NEGATIVE mg/dL
Specific Gravity, Urine: 1.015 (ref 1.005–1.030)
pH: 6.5 (ref 5.0–8.0)

## 2020-11-13 LAB — BASIC METABOLIC PANEL
Anion gap: 7 (ref 5–15)
BUN: 52 mg/dL — ABNORMAL HIGH (ref 8–23)
CO2: 19 mmol/L — ABNORMAL LOW (ref 22–32)
Calcium: 8 mg/dL — ABNORMAL LOW (ref 8.9–10.3)
Chloride: 122 mmol/L — ABNORMAL HIGH (ref 98–111)
Creatinine, Ser: 1.7 mg/dL — ABNORMAL HIGH (ref 0.61–1.24)
GFR, Estimated: 42 mL/min — ABNORMAL LOW (ref >60)
Glucose, Bld: 142 mg/dL — ABNORMAL HIGH (ref 70–99)
Potassium: 3.7 mmol/L (ref 3.5–5.1)
Sodium: 148 mmol/L — ABNORMAL HIGH (ref 135–145)

## 2020-11-13 LAB — CBC
HCT: 35.8 % — ABNORMAL LOW (ref 39.0–52.0)
Hemoglobin: 10.7 g/dL — ABNORMAL LOW (ref 13.0–17.0)
MCH: 25.8 pg — ABNORMAL LOW (ref 26.0–34.0)
MCHC: 29.9 g/dL — ABNORMAL LOW (ref 30.0–36.0)
MCV: 86.5 fL (ref 80.0–100.0)
Platelets: 91 10*3/uL — ABNORMAL LOW (ref 150–400)
RBC: 4.14 MIL/uL — ABNORMAL LOW (ref 4.22–5.81)
RDW: 15.9 % — ABNORMAL HIGH (ref 11.5–15.5)
WBC: 5.5 10*3/uL (ref 4.0–10.5)
nRBC: 0 % (ref 0.0–0.2)

## 2020-11-13 LAB — RESP PANEL BY RT-PCR (FLU A&B, COVID) ARPGX2
Influenza A by PCR: NEGATIVE
Influenza B by PCR: NEGATIVE
SARS Coronavirus 2 by RT PCR: NEGATIVE

## 2020-11-13 LAB — GLUCOSE, CAPILLARY
Glucose-Capillary: 130 mg/dL — ABNORMAL HIGH (ref 70–99)
Glucose-Capillary: 141 mg/dL — ABNORMAL HIGH (ref 70–99)
Glucose-Capillary: 90 mg/dL (ref 70–99)
Glucose-Capillary: 100 mg/dL — ABNORMAL HIGH (ref 70–99)
Glucose-Capillary: 118 mg/dL — ABNORMAL HIGH (ref 70–99)
Glucose-Capillary: 109 mg/dL — ABNORMAL HIGH (ref 70–99)

## 2020-11-13 MED ORDER — SODIUM CHLORIDE 0.9 % IV SOLN
2.0000 g | Freq: Two times a day (BID) | INTRAVENOUS | Status: AC
  Administered 2020-11-13 – 2020-11-17 (×10): 2 g via INTRAVENOUS
  Filled 2020-11-13 (×12): qty 2

## 2020-11-13 MED ORDER — VANCOMYCIN HCL 750 MG/150ML IV SOLN
750.0000 mg | INTRAVENOUS | Status: DC
  Administered 2020-11-14: 750 mg via INTRAVENOUS
  Filled 2020-11-13: qty 150

## 2020-11-13 MED ORDER — VANCOMYCIN HCL 1250 MG/250ML IV SOLN
1250.0000 mg | Freq: Once | INTRAVENOUS | Status: AC
  Administered 2020-11-13: 1250 mg via INTRAVENOUS
  Filled 2020-11-13: qty 250

## 2020-11-13 NOTE — Progress Notes (Signed)
Pharmacy Antibiotic Note  Clifford [name] is a 70 y.o. male admitted on 10/14/2020 with sepsis.  Pharmacy has been consulted for Vanco, Cefepime dosing.  CC/HPI: Had an initial stroke on 8/3, was admitted for 2 days then discharged with home health. Admitted now with enlargement of his previous stroke with hemorrhagic conversion  PMH:  10/2020 R MCA/PCA ischemic stroke , CHF, HTN, HLD, hx recurrent PE on lifelong AC, hx tobacco use, kidney neoplasm   ID: sepsis. WBC WNL. Temp 102.9. CrCl 39  Vanco 9/10>> Cefepime 9/10>>  Plan: F/u cultures Cefepime 2g IV q12 hrs Vanco '1250mg'$  IV x 1  Vancomycin 750 mg IV Q 24 hrs. Goal AUC 400-550. Expected AUC: 477 SCr used: 1.7   Height: '5\' 5"'$  (165.1 cm) Weight: 85.3 kg (188 lb 0.8 oz) (bed weight) IBW/kg (Calculated) : 61.5  Temp (24hrs), Avg:99.7 F (37.6 C), Min:98.4 F (36.9 C), Max:102.9 F (39.4 C)  Recent Labs  Lab 11/07/20 0122 11/09/20 0052 11/10/20 0247 11/11/20 0113 11/12/20 0304  WBC 8.4 8.9 6.4  --  7.6  CREATININE 1.61* 1.82* 1.77* 1.66* 1.70*    Estimated Creatinine Clearance: 39.4 mL/min (A) (by C-G formula based on SCr of 1.7 mg/dL (H)).    Allergies  Allergen Reactions   Apixaban     Other reaction(s): Liver enzymes abnormal   Lisinopril     Other reaction(s): Cough (finding)   Simvastatin     Other reaction(s): Pain in lower limb (finding)    Brown Dunlap S. Alford Highland, PharmD, BCPS Clinical Staff Pharmacist [email or web address]  Eilene Ghazi Stillinger 11/13/2020 9:00 AM

## 2020-11-13 NOTE — Progress Notes (Signed)
   11/13/20 0807  Assess: MEWS Score  Temp (!) 102.9 F (39.4 C)  BP (!) 156/81  Pulse Rate 79  Resp 14  SpO2 99 %  O2 Device Room Air  Assess: MEWS Score  MEWS Temp 2  MEWS Systolic 0  MEWS Pulse 0  MEWS RR 0  MEWS LOC 0  MEWS Score 2  MEWS Score Color Yellow  Assess: if the MEWS score is Yellow or Red  Were vital signs taken at a resting state? Yes  Focused Assessment Change from prior assessment (see assessment flowsheet)  Early Detection of Sepsis Score *See Row Information* Medium  MEWS guidelines implemented *See Row Information* Yes  Treat  MEWS Interventions Administered scheduled meds/treatments;Escalated (See documentation below)  Pain Scale 0-10  Pain Score 0  Take Vital Signs  Increase Vital Sign Frequency  Yellow: Q 2hr X 2 then Q 4hr X 2, if remains yellow, continue Q 4hrs  Escalate  MEWS: Escalate Yellow: discuss with charge nurse/RN and consider discussing with provider and RRT  Notify: Charge Nurse/RN  Name of Charge Nurse/RN Notified Aaron Edelman  Date Charge Nurse/RN Notified 11/13/20  Time Charge Nurse/RN Notified [phone]  Notify: Provider  Provider Name/Title [facility]  Date Provider Notified 11/13/20  Time Provider Notified [phone]  Notification Type  (secure chat)  Notification Reason Other (Comment) (fever)  Provider response See new orders  Date of Provider Response 11/13/20  Time of Provider Response [phone]  Document  Patient Outcome Not stable and remains on department  Progress note created (see row info) Yes    Patient with fever, Tylenol given per order. MD informed via secure chat. See orders received.

## 2020-11-13 NOTE — Progress Notes (Signed)
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  Had an initial stroke on 8/3, was admitted for 2 days then discharged with home health.  After about 4 days at home, he developed worsening of his stroke symptoms, return to the hospital.  In the ER, found to have enlargement of his previous stroke with microhemorrhage.  Initially had relatively mild deficits still, NIHSS 4, so his Pradaxa was held and he was admitted for SNF placement for short-term rehab.  However, he developed progressively worsening left hemiparesis, then somnolence.  Neurology were reconsulted.  Serial CT head on 8/16 showed expected evolution/progression of his stroke, further edema but no herniation or  substantial midline shift. MRI ; 8/25: increasing midline shift.  Neurology following the patient and recommended further supportive care and SNF placement.  NG tube was placed as patient lost ability to swallow.  Palliative care were consulted and in detail family discussion, with prognosis provided by neurology (more likely able to regain ability to talk and participate in self-care, unlikely to regain ability to walk, uncertain if able to regain ability to swallow, very unlikely to improve to full independence), it was decided to place PEG and pursue a SNF for rehab.  Awaiting skilled nursing facility   Assessment & Plan:   Principal Problem:   Left-sided weakness Active Problems:   Stroke [facility])   Essential hypertension   Mixed hyperlipidemia   Prediabetes   History of pulmonary embolism   Chronic anticoagulation   Nicotine dependence, cigarettes, uncomplicated   Petechial hemorrhage   Acute stroke due to  ischemia [facility])   Advanced care planning/counseling discussion   Goals of care, counseling/discussion   Palliative care by specialist   1-Increase petechial hemorrhages/evolution of previous right MCA/PCA territory infarct with mild edema with now increasing midline shift on repeat MRI 10/28/2020: -MRI mild enlargement with cytotoxic edema and petechial hemorrhage with increased midline shift compared to 10/07/2020. -Neurology suspect his current poor functional status is mostly due to the swelling, with a component of this from free water and a component of this from bleeding. -He remains with significant dysphagia, PEG tube placed by IR 8/30 -Patient was a started on max for headache. -Repeat CT head 8/30 showing progressive CVA, midline shift is 10 mm as opposed to 14 mm on MRI 8/25. -Due to sinus tachycardia of unclear cause, CT head was repeated on 9/2 which showed a stable CVA and midline shift is now 6 mm. Awaiting skilled nursing facility He was more alert 9/09,  he was able to vocalized words and voice was stronger.  He spike fever, sleepy today.   2-Fever:  -Pan culture.  -Start IV antibiotics.  -Chest x ray no acute diseases, low lung volume..    HTN, Tachycardia:   -Continue with amlodipine and hydralazine -Started Metoprolol.   3-Suspected  focal seizure: Presented with left hand twitching which was nonsuppressible and clinically suspicious for stroke and was a started on Keppra. Keppra Reduce 8/24 to eliminate  sedating side effect from Keppra.  Provigil  90/3.   Cough; Chest x ray no active disease.  lutter valve  Hypernatremia: Due to decreased free water, monitor overall stable. Discussed with Dr Erlinda Hong, ok to start correcting sodium level  with free water.  Continue with half NS .  Sodium improving. Down to 148  Acute kidney injury on CKD stage III A: Creatinine 1.2-1.4 Monitor cr. 1.7  History of recurrent VTE: Continue with Eliquis.  Dysphagia: Continue PEG  tube  Lung nodule: Incidental finding of 8 mm left upper lobe nodule.  Need to follow-up CT 6 to 12 months depending on neurological progression  Possible sclerotic vertebra on imaging: Incidental finding.  PSA was 5.  Follow-up as an outpatient  Chronic diastolic heart failure: Euvolemic Hyperkalemia; Resolved with Lokelma Right eye pain: With some scleral injection, case was discussed with ophthalmology Dr. Posey Pronto, will think likely related to abrasion from a stroke and it remains open.     Nutrition Problem: Inadequate oral intake Etiology: dysphagia    Signs/Symptoms: NPO status    Interventions: Tube feeding, Prostat  Estimated body mass index is 31.29 kg/m as calculated from the following:   Height as of this encounter: '5\' 5"'$  (1.651 m).   Weight as of this encounter: 85.3 kg.   DVT prophylaxis: Eliquis Code Status: DNR Family Communication: Care discussed with brother who was at bedside 9/09. Disposition Plan:  Status is: Inpatient  Remains inpatient appropriate because:IV treatments appropriate due to intensity of illness or inability to take PO  Dispo: The patient is from: Home              Anticipated d/c is to: SNF              Patient currently is medically stable to d/c.   Difficult to place patient No        Consultants:  Neurology Palliative  Procedures:  Peg tube placement. 8/30.  Antimicrobials:  None  Subjective: He spike fever this am.  He is sleepy     Objective: Vitals:   11/13/20 0419 11/13/20 0807 11/13/20 1019 11/13/20 1127  BP: 131/74 (!) 156/81 113/66 118/65  Pulse: 73 79 67 64  Resp: '16 14  14  '$ Temp:  (!) 102.9 F (39.4 C) 98.8 F (37.1 C) 98.9 F (37.2 C)  TempSrc:   Oral Oral  SpO2: 100% 99% 100% 100%  Weight:      Height:        Intake/Output Summary (Last 24 hours) at 11/13/2020 1407 Last data filed at 11/13/2020 0600 Gross per 24 hour  Intake 3182.48 ml  Output 2100 ml  Net 1082.48 ml    Filed  Weights   11/07/20 0500 11/08/20 0600 11/12/20 0348  Weight: 82.5 kg 83.6 kg 85.3 kg    Examination:  General exam: NAD Respiratory system: BL [place] Cardiovascular system: S 1, S 2 RRR Gastrointestinal system: BS present, soft, nt Central nervous system: sleepy  Extremities: No edema  Data Reviewed: I have personally reviewed following labs and imaging studies  CBC: Recent Labs  Lab 11/07/20 0122 11/09/20 0052 11/10/20 0247 11/12/20 0304 11/13/20 0902  WBC 8.4 8.9 6.4 7.6 5.5  HGB 13.2 13.4 13.3 12.4* 10.7*  HCT 44.7 44.2 44.4 40.4 35.8*  MCV 86.1 85.8 86.0 85.4 86.5  PLT 121* 128* 127* 92* 91*    Basic Metabolic Panel: Recent Labs  Lab 11/09/20 0052 11/10/20 0247 11/11/20 0113 11/12/20 0304 11/13/20 0902  NA 153* 154* 154* 151* 148*  K 3.9 3.7 4.4 3.9 3.7  CL 124* 123* 129* 124* 122*  CO2 22 19* 20* 21* 19*  GLUCOSE 110* 169* 145* 130* 142*  BUN 65* 60* 63* 54* 52*  CREATININE 1.82* 1.77* 1.66* 1.70*  1.70*  CALCIUM 9.4 9.2 9.3 8.7* 8.0*    GFR: Estimated Creatinine Clearance: 39.4 mL/min (A) (by C-G formula based on SCr of 1.7 mg/dL (H)). Liver Function Tests: Recent Labs  Lab 11/09/20 0052 11/12/20 0304  AST 56* 97*  ALT 83* 134*  ALKPHOS 74 69  BILITOT 0.7 0.6  PROT 7.1 6.3*  ALBUMIN 3.0* 2.5*    No results for input(s): LIPASE, AMYLASE in the last 168 hours. No results for input(s): AMMONIA in the last 168 hours. Coagulation Profile: No results for input(s): INR, PROTIME in the last 168 hours. Cardiac Enzymes: No results for input(s): CKTOTAL, CKMB, CKMBINDEX, TROPONINI in the last 168 hours. BNP (last 3 results) No results for input(s): PROBNP in the last 8760 hours. HbA1C: No results for input(s): HGBA1C in the last 72 hours. CBG: Recent Labs  Lab 11/12/20 2024 11/12/20 2325 11/13/20 0409 11/13/20 0804 11/13/20 1127  GLUCAP 124* 113* 130* 141* 90    Lipid Profile: No results for input(s): CHOL, HDL, LDLCALC, TRIG, CHOLHDL,  LDLDIRECT in the last 72 hours. Thyroid Function Tests: No results for input(s): TSH, T4TOTAL, FREET4, T3FREE, THYROIDAB in the last 72 hours. Anemia Panel: No results for input(s): VITAMINB12, FOLATE, FERRITIN, TIBC, IRON, RETICCTPCT in the last 72 hours. Sepsis Labs: No results for input(s): PROCALCITON, LATICACIDVEN in the last 168 hours.  Recent Results (from the past 240 hour(s))  MRSA Next Gen by PCR, Nasal     Status: None   Collection Time: 11/13/20  8:25 AM   Specimen: Nasal Mucosa; Nasal Swab  Result Value Ref Range Status   MRSA by PCR Next Gen NOT DETECTED NOT DETECTED Final    Comment: (NOTE) The GeneXpert MRSA Assay (FDA approved for NASAL specimens only), is one component of a comprehensive MRSA colonization surveillance program. It is not intended to diagnose MRSA infection nor to guide or monitor treatment for MRSA infections. Test performance is not FDA approved in patients less than 80 years old. Performed at [facility], [place] [address] [place], [place] [address]           Radiology Studies: DG CHEST PORT 1 VIEW  Result Date: 11/13/2020 CLINICAL DATA:  70 year old male with fever and left-sided weakness EXAM: PORTABLE CHEST 1 VIEW COMPARISON:  11/11/2020 FINDINGS: Cardiomediastinal silhouette unchanged in size and contour. No evidence of central vascular congestion. No interlobular septal thickening. Low lung volumes persist with likely basilar atelectasis. No pneumothorax or pleural effusion. Coarsened interstitial markings, with no confluent airspace disease. No acute displaced fracture. Degenerative changes of the spine. IMPRESSION: Persistent low lung volumes, with otherwise no evidence of acute cardiopulmonary disease Electronically Signed   By: Corrie Mckusick D.O.   On: 11/13/2020 11:29        Scheduled Meds:  amLODipine  10 mg Per Tube Daily   apixaban  5 mg Per Tube BID   atorvastatin  40 mg Per Tube Daily   erythromycin   Right Eye TID    feeding supplement (PROSource TF)  45 mL Per Tube BID   free water  250 mL Per Tube Q4H   hydrALAZINE  25 mg Per Tube QID   insulin aspart  0-9 Units Subcutaneous Q4H   levETIRAcetam  250 mg Per Tube BID   mouth rinse  15 mL Mouth Rinse BID   metoprolol tartrate  12.5 mg Per Tube BID   modafinil  100 mg Per Tube Daily   nystatin  5 mL Mouth/Throat QID   pantoprazole sodium  40 mg Per Tube Daily   topiramate  50 mg Per Tube BID   Continuous Infusions:  sodium chloride 75 mL/hr at 11/13/20 1309   ceFEPime (MAXIPIME) IV     feeding supplement (JEVITY 1.5 CAL/FIBER) 1,000 mL (11/12/20 2112)   vancomycin 1,250 mg (11/13/20 1311)   [START ON 11/14/2020] vancomycin       LOS: 30 days    Time spent: 35 minutes.     Elmarie Shiley, MD [facility]   If 7PM-7AM, please contact night-coverage [email or web address]  11/13/2020, 2:07 PM

## 2020-11-14 DIAGNOSIS — R531 Weakness: Secondary | ICD-10-CM | POA: Diagnosis not present

## 2020-11-14 LAB — GLUCOSE, CAPILLARY
Glucose-Capillary: 100 mg/dL — ABNORMAL HIGH (ref 70–99)
Glucose-Capillary: 102 mg/dL — ABNORMAL HIGH (ref 70–99)
Glucose-Capillary: 103 mg/dL — ABNORMAL HIGH (ref 70–99)
Glucose-Capillary: 104 mg/dL — ABNORMAL HIGH (ref 70–99)
Glucose-Capillary: 108 mg/dL — ABNORMAL HIGH (ref 70–99)
Glucose-Capillary: 113 mg/dL — ABNORMAL HIGH (ref 70–99)
Glucose-Capillary: 116 mg/dL — ABNORMAL HIGH (ref 70–99)

## 2020-11-14 LAB — BASIC METABOLIC PANEL
Anion gap: 6 (ref 5–15)
BUN: 47 mg/dL — ABNORMAL HIGH (ref 8–23)
CO2: 18 mmol/L — ABNORMAL LOW (ref 22–32)
Calcium: 8.2 mg/dL — ABNORMAL LOW (ref 8.9–10.3)
Chloride: 123 mmol/L — ABNORMAL HIGH (ref 98–111)
Creatinine, Ser: 1.46 mg/dL — ABNORMAL HIGH (ref 0.61–1.24)
GFR, Estimated: 51 mL/min — ABNORMAL LOW (ref >60)
Glucose, Bld: 124 mg/dL — ABNORMAL HIGH (ref 70–99)
Potassium: 3.9 mmol/L (ref 3.5–5.1)
Sodium: 147 mmol/L — ABNORMAL HIGH (ref 135–145)

## 2020-11-14 LAB — URINE CULTURE: Culture: <10000 — AB

## 2020-11-14 NOTE — Plan of Care (Signed)
  Problem: Education: Goal: Knowledge of General Education information will improve Description: Including pain rating scale, medication(s)/side effects and non-pharmacologic comfort measures Outcome: Not Progressing   

## 2020-11-14 NOTE — Progress Notes (Signed)
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  Had an initial stroke on 8/3, was admitted for 2 days then discharged with home health.  After about 4 days at home, he developed worsening of his stroke symptoms, return to the hospital.  In the ER, found to have enlargement of his previous stroke with microhemorrhage.  Initially had relatively mild deficits still, NIHSS 4, so his Pradaxa was held and he was admitted for SNF placement for short-term rehab.  However, he developed progressively worsening left hemiparesis, then somnolence.  Neurology were reconsulted.  Serial CT head on 8/16 showed expected evolution/progression of his stroke, further edema but no herniation or  substantial midline shift. MRI ; 8/25: increasing midline shift.  Neurology following the patient and recommended further supportive care and SNF placement.  NG tube was placed as patient lost ability to swallow.  Palliative care were consulted and in detail family discussion, with prognosis provided by neurology (more likely able to regain ability to talk and participate in self-care, unlikely to regain ability to walk, uncertain if able to regain ability to swallow, very unlikely to improve to full independence), it was decided to place PEG and pursue a SNF for rehab.  Awaiting skilled nursing facility   Assessment & Plan:   Principal Problem:   Left-sided weakness Active Problems:   Stroke [facility])   Essential hypertension   Mixed hyperlipidemia   Prediabetes   History of pulmonary embolism   Chronic anticoagulation   Nicotine dependence, cigarettes, uncomplicated   Petechial hemorrhage   Acute stroke due to  ischemia [facility])   Advanced care planning/counseling discussion   Goals of care, counseling/discussion   Palliative care by specialist   1-Increase petechial hemorrhages/evolution of previous right MCA/PCA territory infarct with mild edema with now increasing midline shift on repeat MRI 10/28/2020: -MRI mild enlargement with cytotoxic edema and petechial hemorrhage with increased midline shift compared to 10/07/2020. -Neurology suspect his current poor functional status is mostly due to the swelling, with a component of this from free water and a component of this from bleeding. -He remains with significant dysphagia, PEG tube placed by IR 8/30 -Patient was a started on max for headache. -Repeat CT head 8/30 showing progressive CVA, midline shift is 10 mm as opposed to 14 mm on MRI 8/25. -Due to sinus tachycardia of unclear cause, CT head was repeated on 9/2 which showed a stable CVA and midline shift is now 6 mm. Awaiting skilled nursing facility He was more alert 9/09,  he was able to vocalized words and voice was stronger.  Alert today. Speech soft.   2-Fever at 102 on 9/10:  -Pan culture.  -Started  IV antibiotics 9/10 vanc and cefepime.  -Chest x ray no acute diseases, low lung volume..  -UA; Negative, Urine culture: 10,000 colonies.  -Blood culture: No growth To date.  Covid test negative 9/10. MRSA PCR negative, will discontinue vancomycin.   HTN, Tachycardia:   -Continue with amlodipine and hydralazine -Started Metoprolol.   3-Suspected  focal seizure: Presented with left hand twitching which was nonsuppressible and clinically suspicious for stroke and was a started on Keppra. Keppra Reduce 8/24 to eliminate  sedating side effect from Keppra.  Provigil  90/3.  Cough; Chest x ray no active disease.  Flutter valve  Hypernatremia: Due to decreased free water, monitor overall stable. Discussed with Dr Erlinda Hong, ok to start correcting sodium level with free water.  Continue with  half NS .  Sodium improving. Down to 147  Acute kidney injury on CKD stage III A: Creatinine 1.2-1.4 Monitor cr. 1.7---1.4 Improving.   History of recurrent VTE: Continue with Eliquis.  Dysphagia: Continue PEG tube  Lung nodule: Incidental finding of 8 mm left upper lobe nodule.  Need to follow-up CT 6 to 12 months depending on neurological progression  Possible sclerotic vertebra on imaging: Incidental finding.  PSA was 5.  Follow-up as an outpatient  Chronic diastolic heart failure: Euvolemic Hyperkalemia; Resolved with Lokelma Right eye pain: With some scleral injection, case was discussed with ophthalmology Dr. Posey Pronto, will think likely related to abrasion from a stroke and it remains open.     Nutrition Problem: Inadequate oral intake Etiology: dysphagia    Signs/Symptoms: NPO status    Interventions: Tube feeding, Prostat  Estimated body mass index is 31.99 kg/m as calculated from the following:   Height as of this encounter: '5\' 5"'$  (1.651 m).   Weight as of this encounter: 87.2 kg.   DVT prophylaxis: Eliquis Code Status: DNR Family Communication: Care discussed with brother who was at bedside 9/10. Disposition Plan:  Status is: Inpatient  Remains inpatient appropriate because:IV treatments appropriate due to intensity of illness or inability to take PO  Dispo: The patient is from: Home              Anticipated d/c is to: SNF              Patient currently is medically stable to d/c.   Difficult to place patient No        Consultants:  Neurology Palliative  Procedures:  Peg tube placement. 8/30.  Antimicrobials:  None  Subjective: He is more alert than yesterday. He is answer few questions, speech is soft, slurred.   Objective: Vitals:   11/14/20 0500 11/14/20 0757 11/14/20 1125 11/14/20 1500  BP:  (!) 151/95 136/67 129/67  Pulse:  74 64 67  Resp:  '14 14 14  '$ Temp:  98.1 F (36.7 C) 98.6 F (37 C) 98.6 F (37 C)  TempSrc:  Oral  Oral Oral  SpO2:  100% 100% 100%  Weight: 87.2 kg     Height:        Intake/Output Summary (Last 24 hours) at 11/14/2020 1829 Last data filed at 11/14/2020 1751 Gross per 24 hour  Intake 2489.55 ml  Output 1600 ml  Net 889.55 ml    Filed Weights   11/08/20 0600 11/12/20 0348 11/14/20 0500  Weight: 83.6 kg 85.3 kg 87.2 kg    Examination:  General exam: NAD Respiratory system: BL [place] Cardiovascular system: S 1, S 2 RRR Gastrointestinal system: BS present, soft, nt Central nervous system: alert, left side hemiplegia Extremities: no edema  Data Reviewed: I have personally reviewed following labs and imaging studies  CBC: Recent Labs  Lab 11/09/20 0052 11/10/20 0247 11/12/20 0304 11/13/20 0902  WBC 8.9 6.4 7.6 5.5  HGB 13.4 13.3 12.4* 10.7*  HCT 44.2 44.4 40.4 35.8*  MCV 85.8 86.0 85.4 86.5  PLT 128* 127* 92* 91*    Basic Metabolic Panel: Recent Labs  Lab 11/10/20 0247 11/11/20 0113 11/12/20 0304 11/13/20 0902 11/14/20 0222  NA 154* 154* 151* 148* 147*  K 3.7 4.4 3.9 3.7 3.9  CL 123*  129* 124* 122* 123*  CO2 19* 20* 21* 19* 18*  GLUCOSE 169* 145* 130* 142* 124*  BUN 60* 63* 54* 52* 47*  CREATININE 1.77* 1.66* 1.70* 1.70* 1.46*  CALCIUM 9.2 9.3 8.7* 8.0* 8.2*    GFR: Estimated Creatinine Clearance: 46.4 mL/min (A) (by C-G formula based on SCr of 1.46 mg/dL (H)). Liver Function Tests: Recent Labs  Lab 11/09/20 0052 11/12/20 0304  AST 56* 97*  ALT 83* 134*  ALKPHOS 74 69  BILITOT 0.7 0.6  PROT 7.1 6.3*  ALBUMIN 3.0* 2.5*    No results for input(s): LIPASE, AMYLASE in the last 168 hours. No results for input(s): AMMONIA in the last 168 hours. Coagulation Profile: No results for input(s): INR, PROTIME in the last 168 hours. Cardiac Enzymes: No results for input(s): CKTOTAL, CKMB, CKMBINDEX, TROPONINI in the last 168 hours. BNP (last 3 results) No results for input(s): PROBNP in the last 8760 hours. HbA1C: No results for input(s): HGBA1C in  the last 72 hours. CBG: Recent Labs  Lab 11/14/20 0019 11/14/20 0344 11/14/20 0759 11/14/20 1127 11/14/20 1553  GLUCAP 103* 116* 108* 113* 102*    Lipid Profile: No results for input(s): CHOL, HDL, LDLCALC, TRIG, CHOLHDL, LDLDIRECT in the last 72 hours. Thyroid Function Tests: No results for input(s): TSH, T4TOTAL, FREET4, T3FREE, THYROIDAB in the last 72 hours. Anemia Panel: No results for input(s): VITAMINB12, FOLATE, FERRITIN, TIBC, IRON, RETICCTPCT in the last 72 hours. Sepsis Labs: No results for input(s): PROCALCITON, LATICACIDVEN in the last 168 hours.  Recent Results (from the past 240 hour(s))  Urine Culture     Status: Abnormal   Collection Time: 11/13/20  8:24 AM   Specimen: Urine, Clean Catch  Result Value Ref Range Status   Specimen Description URINE, CLEAN CATCH  Final   Special Requests NONE  Final   Culture (A)  Final    <10,000 COLONIES/mL INSIGNIFICANT GROWTH Performed at [facility], [address], [place], [place] [address]    Report Status 11/14/2020 FINAL  Final  MRSA Next Gen by PCR, Nasal     Status: None   Collection Time: 11/13/20  8:25 AM   Specimen: Nasal Mucosa; Nasal Swab  Result Value Ref Range Status   MRSA by PCR Next Gen NOT DETECTED NOT DETECTED Final    Comment: (NOTE) The GeneXpert MRSA Assay (FDA approved for NASAL specimens only), is one component of a comprehensive MRSA colonization surveillance program. It is not intended to diagnose MRSA infection nor to guide or monitor treatment for MRSA infections. Test performance is not FDA approved in patients less than 10 years old. Performed at [facility], [place] [address], [place], [place] [address]   Culture, blood (routine x 2)     Status: None (Preliminary result)   Collection Time: 11/13/20  9:02 AM   Specimen: BLOOD  Result Value Ref Range Status   Specimen Description BLOOD RIGHT ANTECUBITAL  Final   Special Requests   Final    BOTTLES DRAWN AEROBIC ONLY Blood  Culture adequate volume   Culture   Final    NO GROWTH 1 DAY Performed at [facility], [place] [address] [place], [place] [address]    Report Status PENDING  Incomplete  Culture, blood (routine x 2)     Status: None (Preliminary result)   Collection Time: 11/13/20  9:08 AM   Specimen: BLOOD  Result Value Ref Range Status   Specimen Description BLOOD LEFT ANTECUBITAL  Final  Special Requests   Final    BOTTLES DRAWN AEROBIC AND ANAEROBIC Blood Culture adequate volume   Culture   Final    NO GROWTH 1 DAY Performed at [facility], [place] [address], [place], [place] [address]    Report Status PENDING  Incomplete  Resp Panel by RT-PCR (Flu A&B, Covid) Nasopharyngeal Swab     Status: None   Collection Time: 11/13/20  5:07 PM   Specimen: Nasopharyngeal Swab; Nasopharyngeal(NP) swabs in vial transport medium  Result Value Ref Range Status   SARS Coronavirus 2 by RT PCR NEGATIVE NEGATIVE Final    Comment: (NOTE) SARS-CoV-2 target nucleic acids are NOT DETECTED.  The SARS-CoV-2 RNA is generally detectable in upper respiratory specimens during the acute phase of infection. The lowest concentration of SARS-CoV-2 viral copies this assay can detect is 138 copies/mL. A negative result does not preclude SARS-Cov-2 infection and should not be used as the sole basis for treatment or other patient management decisions. A negative result may occur with  improper specimen collection/handling, submission of specimen other than nasopharyngeal swab, presence of viral mutation(s) within the areas targeted by this assay, and inadequate number of viral copies(<138 copies/mL). A negative result must be combined with clinical observations, patient history, and epidemiological information. The expected result is Negative.  Fact Sheet for Patients:  EntrepreneurPulse.com.au  Fact Sheet for Healthcare Providers:  IncredibleEmployment.be  This test is no t  yet approved or cleared by the [place] FDA and  has been authorized for detection and/or diagnosis of SARS-CoV-2 by FDA under an Emergency Use Authorization (EUA). This EUA will remain  in effect (meaning this test can be used) for the duration of the COVID-19 declaration under Section 564(b)(1) of the Act, 21 U.S.C.section 360bbb-3(b)(1), unless the authorization is terminated  or revoked sooner.       Influenza A by PCR NEGATIVE NEGATIVE Final   Influenza B by PCR NEGATIVE NEGATIVE Final    Comment: (NOTE) The Xpert Xpress SARS-CoV-2/FLU/RSV plus assay is intended as an aid in the diagnosis of influenza from Nasopharyngeal swab specimens and should not be used as a sole basis for treatment. Nasal washings and aspirates are unacceptable for Xpert Xpress SARS-CoV-2/FLU/RSV testing.  Fact Sheet for Patients: EntrepreneurPulse.com.au  Fact Sheet for Healthcare Providers: IncredibleEmployment.be  This test is not yet approved or cleared by the [place] FDA and has been authorized for detection and/or diagnosis of SARS-CoV-2 by FDA under an Emergency Use Authorization (EUA). This EUA will remain in effect (meaning this test can be used) for the duration of the COVID-19 declaration under Section 564(b)(1) of the Act, 21 U.S.C. section 360bbb-3(b)(1), unless the authorization is terminated or revoked.  Performed at [facility], [place] [address], [place],  [address]           Radiology Studies: DG CHEST PORT 1 VIEW  Result Date: 11/13/2020 CLINICAL DATA:  70 year old male with fever and left-sided weakness EXAM: PORTABLE CHEST 1 VIEW COMPARISON:  11/11/2020 FINDINGS: Cardiomediastinal silhouette unchanged in size and contour. No evidence of central vascular congestion. No interlobular septal thickening. Low lung volumes persist with likely basilar atelectasis. No pneumothorax or pleural effusion. Coarsened interstitial  markings, with no confluent airspace disease. No acute displaced fracture. Degenerative changes of the spine. IMPRESSION: Persistent low lung volumes, with otherwise no evidence of acute cardiopulmonary disease Electronically Signed   By: Corrie Mckusick D.O.   On: 11/13/2020 11:29        Scheduled Meds:  amLODipine  10 mg Per Tube Daily   apixaban  5 mg Per Tube BID   atorvastatin  40 mg Per Tube Daily   erythromycin   Right Eye TID   feeding supplement (PROSource TF)  45 mL Per Tube BID   free water  250 mL Per Tube Q4H   hydrALAZINE  25 mg Per Tube QID   insulin aspart  0-9 Units Subcutaneous Q4H   levETIRAcetam  250 mg Per Tube BID   mouth rinse  15 mL Mouth Rinse BID   metoprolol tartrate  12.5 mg Per Tube BID   modafinil  100 mg Per Tube Daily   nystatin  5 mL Mouth/Throat QID   pantoprazole sodium  40 mg Per Tube Daily   topiramate  50 mg Per Tube BID   Continuous Infusions:  sodium chloride 75 mL/hr at 11/13/20 1309   ceFEPime (MAXIPIME) IV 2 g (11/14/20 0909)   feeding supplement (JEVITY 1.5 CAL/FIBER) 1,000 mL (11/12/20 2112)   vancomycin 750 mg (11/14/20 0952)     LOS: 31 days    Time spent: 35 minutes.     Elmarie Shiley, MD [facility]   If 7PM-7AM, please contact night-coverage [email or web address]  11/14/2020, 6:29 PM

## 2020-11-15 DIAGNOSIS — R531 Weakness: Secondary | ICD-10-CM | POA: Diagnosis not present

## 2020-11-15 LAB — BASIC METABOLIC PANEL
Anion gap: 5 (ref 5–15)
BUN: 38 mg/dL — ABNORMAL HIGH (ref 8–23)
CO2: 20 mmol/L — ABNORMAL LOW (ref 22–32)
Calcium: 8.3 mg/dL — ABNORMAL LOW (ref 8.9–10.3)
Chloride: 118 mmol/L — ABNORMAL HIGH (ref 98–111)
Creatinine, Ser: 1.46 mg/dL — ABNORMAL HIGH (ref 0.61–1.24)
GFR, Estimated: 51 mL/min — ABNORMAL LOW (ref >60)
Glucose, Bld: 103 mg/dL — ABNORMAL HIGH (ref 70–99)
Potassium: 4.6 mmol/L (ref 3.5–5.1)
Sodium: 143 mmol/L (ref 135–145)

## 2020-11-15 LAB — CBC
HCT: 35.6 % — ABNORMAL LOW (ref 39.0–52.0)
Hemoglobin: 11.2 g/dL — ABNORMAL LOW (ref 13.0–17.0)
MCH: 26.4 pg (ref 26.0–34.0)
MCHC: 31.5 g/dL (ref 30.0–36.0)
MCV: 83.8 fL (ref 80.0–100.0)
Platelets: 93 10*3/uL — ABNORMAL LOW (ref 150–400)
RBC: 4.25 MIL/uL (ref 4.22–5.81)
RDW: 15 % (ref 11.5–15.5)
WBC: 7.4 10*3/uL (ref 4.0–10.5)
nRBC: 0 % (ref 0.0–0.2)

## 2020-11-15 LAB — GLUCOSE, CAPILLARY
Glucose-Capillary: 100 mg/dL — ABNORMAL HIGH (ref 70–99)
Glucose-Capillary: 108 mg/dL — ABNORMAL HIGH (ref 70–99)
Glucose-Capillary: 110 mg/dL — ABNORMAL HIGH (ref 70–99)
Glucose-Capillary: 114 mg/dL — ABNORMAL HIGH (ref 70–99)
Glucose-Capillary: 119 mg/dL — ABNORMAL HIGH (ref 70–99)
Glucose-Capillary: 99 mg/dL (ref 70–99)

## 2020-11-15 MED ORDER — SACCHAROMYCES BOULARDII 250 MG PO CAPS
250.0000 mg | ORAL_CAPSULE | Freq: Two times a day (BID) | ORAL | Status: DC
  Administered 2020-11-15 – 2020-12-14 (×59): 250 mg
  Filled 2020-11-15 (×58): qty 1

## 2020-11-15 MED ORDER — FREE WATER
200.0000 mL | Status: DC
  Administered 2020-11-15 – 2020-11-29 (×82): 200 mL

## 2020-11-15 NOTE — Consult Note (Signed)
[place] Nurse wound consult note Consultation was completed by review of records, images and assistance from the bedside nurse/clinical staff.  Reason for Consult: penile lesions Wound type: unclear etiology  Pressure Injury POA: NA Measurement: see nursing flow sheet  Wound bed: see nursing flow sheet Drainage (amount, consistency, odor) no drainage reported Periwound: intact  Dressing procedure/placement/frequency: single layer of xeroform to affected areas, cover with dry dressing, change daily.   Re consult if needed, will not follow at this time. Thanks  Shikita Vaillancourt [employer], RN,CWOCN, CNS, [place] [phone])

## 2020-11-15 NOTE — Progress Notes (Addendum)
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  Had an initial stroke on 8/3, was admitted for 2 days then discharged with home health.  After about 4 days at home, he developed worsening of his stroke symptoms, return to the hospital.  In the ER, found to have enlargement of his previous stroke with microhemorrhage.  Initially had relatively mild deficits still, NIHSS 4, so his Pradaxa was held and he was admitted for SNF placement for short-term rehab.  However, he developed progressively worsening left hemiparesis, then somnolence.  Neurology were reconsulted.  Serial CT head on 8/16 showed expected evolution/progression of his stroke, further edema but no herniation or  substantial midline shift. MRI ; 8/25: increasing midline shift.  Neurology following the patient and recommended further supportive care and SNF placement.  NG tube was placed as patient lost ability to swallow.  Palliative care were consulted and in detail family discussion, with prognosis provided by neurology (more likely able to regain ability to talk and participate in self-care, unlikely to regain ability to walk, uncertain if able to regain ability to swallow, very unlikely to improve to full independence), it was decided to place PEG and pursue a SNF for rehab.  Awaiting skilled nursing facility.   Assessment & Plan:   Principal Problem:   Left-sided weakness Active Problems:   Stroke [facility])   Essential hypertension   Mixed hyperlipidemia   Prediabetes   History of pulmonary embolism   Chronic anticoagulation   Nicotine dependence, cigarettes, uncomplicated   Petechial hemorrhage   Acute stroke due to  ischemia [facility])   Advanced care planning/counseling discussion   Goals of care, counseling/discussion   Palliative care by specialist   1-Increase petechial hemorrhages/evolution of previous right MCA/PCA territory infarct with mild edema with now increasing midline shift on repeat MRI 10/28/2020: -MRI mild enlargement with cytotoxic edema and petechial hemorrhage with increased midline shift compared to 10/07/2020. -Neurology suspect his current poor functional status is mostly due to the swelling, with a component of this from free water and a component of this from bleeding. -He remains with significant dysphagia, PEG tube placed by IR 8/30 -Patient was a started Topomax headache. -Repeat CT head 8/30 showing progressive CVA, midline shift is 10 mm as opposed to 14 mm on MRI 8/25. -Due to sinus tachycardia of unclear cause, CT head was repeated on 9/2 which showed a stable CVA and midline shift is now 6 mm. Awaiting skilled nursing facility He was more alert 9/09,  he was able to vocalized words and voice was stronger.  Sleepy this morning.   2-Fever at 102 on 9/10:  -Pan culture.  -Started  IV antibiotics 9/10 vanc and cefepime. Vancomycin DC 9/11. -Chest x ray no acute diseases, low lung volume..  -UA; Negative, Urine culture: 10,000 colonies.  -Blood culture: No growth To date.  Covid test negative 9/10. MRSA PCR negative.  Plan to treat for 5 days of cefepime.   HTN, Tachycardia:   -Continue with amlodipine and hydralazine -Started Metoprolol.   3-Suspected  focal seizure: Presented with left hand twitching which was nonsuppressible and clinically suspicious for stroke and was a started on Keppra. Keppra Reduce 8/24 to eliminate  sedating side effect from  Keppra.  Provigil  90/3.   Cough; Chest x ray no active disease.  Flutter valve  Hypernatremia: Due to decreased free water, monitor overall stable. Discussed with Dr Erlinda Hong, ok to start correcting sodium level with free  water.  Sodium improving. Down to 143 Stop IV fluids.   Acute kidney injury on CKD stage III A: Creatinine 1.2-1.4 Monitor cr. 1.7---1.4 Improving.   History of recurrent VTE: Continue with Eliquis.  Dysphagia: Continue PEG tube  Lung nodule: Incidental finding of 8 mm left upper lobe nodule.  Need to follow-up CT 6 to 12 months depending on neurological progression  Possible sclerotic vertebra on imaging: Incidental finding.  PSA was 5.  Follow-up as an outpatient  Chronic diastolic heart failure: Euvolemic Hyperkalemia; Resolved with Lokelma Right eye pain: With some scleral injection, case was discussed with ophthalmology Dr. Posey Pronto, will think likely related to abrasion from a stroke and it remains open. Thrombocytopenia; stable. Monitor.  GI, Loose Stool;  Nutritionist consulted, to see if formula can be change.  Start florastore.  Stool is not watery to required rectal tube.   Nutrition Problem: Inadequate oral intake Etiology: dysphagia    Signs/Symptoms: NPO status    Interventions: Tube feeding, Prostat  Estimated body mass index is 31.62 kg/m as calculated from the following:   Height as of this encounter: '5\' 5"'$  (1.651 m).   Weight as of this encounter: 86.2 kg.   DVT prophylaxis: Eliquis Code Status: DNR Family Communication: Care discussed with brother who was at bedside 9/10. Disposition Plan:  Status is: Inpatient  Remains inpatient appropriate because:IV treatments appropriate due to intensity of illness or inability to take PO  Dispo: The patient is from: Home              Anticipated d/c is to: SNF              Patient currently is medically stable to d/c.   Difficult to place patient No        Consultants:  Neurology Palliative  Procedures:  Peg tube placement. 8/30.  Antimicrobials:  None  Subjective: He is sleepy, would say good morning soft speech.   Objective: Vitals:   11/15/20 0207 11/15/20 0512 11/15/20 0816  11/15/20 1209  BP: (!) 141/70  130/80 (!) 121/59  Pulse: 61  73 66  Resp:   16   Temp: 98.7 F (37.1 C)  98.4 F (36.9 C) 98.8 F (37.1 C)  TempSrc: Axillary  Axillary Axillary  SpO2: 100%  100% 100%  Weight:  86.2 kg    Height:        Intake/Output Summary (Last 24 hours) at 11/15/2020 1412 Last data filed at 11/15/2020 0500 Gross per 24 hour  Intake 2733.1 ml  Output 2650 ml  Net 83.1 ml    Filed Weights   11/12/20 0348 11/14/20 0500 11/15/20 0512  Weight: 85.3 kg 87.2 kg 86.2 kg    Examination:  General exam: NAD Respiratory system: BL [place].  Cardiovascular system: S 1, S 2 RRR Gastrointestinal system: BS present, soft, nt Central nervous system: Sleepy, left side hemiplegia.  Extremities: No edema.   Data Reviewed: I have personally reviewed following labs and imaging studies  CBC: Recent Labs  Lab 11/09/20 0052 11/10/20 0247 11/12/20 0304 11/13/20 0902 11/15/20 0353  WBC 8.9 6.4 7.6 5.5 7.4  HGB 13.4 13.3 12.4* 10.7* 11.2*  HCT 44.2 44.4 40.4 35.8* 35.6*  MCV 85.8 86.0 85.4 86.5 83.8  PLT 128* 127* 92* 91* 93*  Basic Metabolic Panel: Recent Labs  Lab 11/11/20 0113 11/12/20 0304 11/13/20 0902 11/14/20 0222 11/15/20 0353  NA 154* 151* 148* 147* 143  K 4.4 3.9 3.7 3.9 4.6  CL 129* 124* 122* 123* 118*  CO2 20* 21* 19* 18* 20*  GLUCOSE 145* 130* 142* 124* 103*  BUN 63* 54* 52* 47* 38*  CREATININE 1.66* 1.70* 1.70* 1.46* 1.46*  CALCIUM 9.3 8.7* 8.0* 8.2* 8.3*    GFR: Estimated Creatinine Clearance: 46.2 mL/min (A) (by C-G formula based on SCr of 1.46 mg/dL (H)). Liver Function Tests: Recent Labs  Lab 11/09/20 0052 11/12/20 0304  AST 56* 97*  ALT 83* 134*  ALKPHOS 74 69  BILITOT 0.7 0.6  PROT 7.1 6.3*  ALBUMIN 3.0* 2.5*    No results for input(s): LIPASE, AMYLASE in the last 168 hours. No results for input(s): AMMONIA in the last 168 hours. Coagulation Profile: No results for input(s): INR, PROTIME in the last 168  hours. Cardiac Enzymes: No results for input(s): CKTOTAL, CKMB, CKMBINDEX, TROPONINI in the last 168 hours. BNP (last 3 results) No results for input(s): PROBNP in the last 8760 hours. HbA1C: No results for input(s): HGBA1C in the last 72 hours. CBG: Recent Labs  Lab 11/14/20 1931 11/14/20 2344 11/15/20 0429 11/15/20 0816 11/15/20 1208  GLUCAP 104* 100* 100* 99 119*    Lipid Profile: No results for input(s): CHOL, HDL, LDLCALC, TRIG, CHOLHDL, LDLDIRECT in the last 72 hours. Thyroid Function Tests: No results for input(s): TSH, T4TOTAL, FREET4, T3FREE, THYROIDAB in the last 72 hours. Anemia Panel: No results for input(s): VITAMINB12, FOLATE, FERRITIN, TIBC, IRON, RETICCTPCT in the last 72 hours. Sepsis Labs: No results for input(s): PROCALCITON, LATICACIDVEN in the last 168 hours.  Recent Results (from the past 240 hour(s))  Urine Culture     Status: Abnormal   Collection Time: 11/13/20  8:24 AM   Specimen: Urine, Clean Catch  Result Value Ref Range Status   Specimen Description URINE, CLEAN CATCH  Final   Special Requests NONE  Final   Culture (A)  Final    <10,000 COLONIES/mL INSIGNIFICANT GROWTH Performed at [facility], [address] [place], [place] [address]    Report Status 11/14/2020 FINAL  Final  MRSA Next Gen by PCR, Nasal     Status: None   Collection Time: 11/13/20  8:25 AM   Specimen: Nasal Mucosa; Nasal Swab  Result Value Ref Range Status   MRSA by PCR Next Gen NOT DETECTED NOT DETECTED Final    Comment: (NOTE) The GeneXpert MRSA Assay (FDA approved for NASAL specimens only), is one component of a comprehensive MRSA colonization surveillance program. It is not intended to diagnose MRSA infection nor to guide or monitor treatment for MRSA infections. Test performance is not FDA approved in patients less than 29 years old. Performed at [facility], [place] [address] [place], [place] [address]   Culture, blood (routine x 2)     Status: None  (Preliminary result)   Collection Time: 11/13/20  9:02 AM   Specimen: BLOOD  Result Value Ref Range Status   Specimen Description BLOOD RIGHT ANTECUBITAL  Final   Special Requests   Final    BOTTLES DRAWN AEROBIC ONLY Blood Culture adequate volume   Culture   Final    NO GROWTH 1 DAY Performed at [facility], [place] [address] [place], [place] [address]    Report Status PENDING  Incomplete  Culture, blood (routine x 2)  Status: None (Preliminary result)   Collection Time: 11/13/20  9:08 AM   Specimen: BLOOD  Result Value Ref Range Status   Specimen Description BLOOD LEFT ANTECUBITAL  Final   Special Requests   Final    BOTTLES DRAWN AEROBIC AND ANAEROBIC Blood Culture adequate volume   Culture   Final    NO GROWTH 1 DAY Performed at [facility], [address], [place], [place] [address]    Report Status PENDING  Incomplete  Resp Panel by RT-PCR (Flu A&B, Covid) Nasopharyngeal Swab     Status: None   Collection Time: 11/13/20  5:07 PM   Specimen: Nasopharyngeal Swab; Nasopharyngeal(NP) swabs in vial transport medium  Result Value Ref Range Status   SARS Coronavirus 2 by RT PCR NEGATIVE NEGATIVE Final    Comment: (NOTE) SARS-CoV-2 target nucleic acids are NOT DETECTED.  The SARS-CoV-2 RNA is generally detectable in upper respiratory specimens during the acute phase of infection. The lowest concentration of SARS-CoV-2 viral copies this assay can detect is 138 copies/mL. A negative result does not preclude SARS-Cov-2 infection and should not be used as the sole basis for treatment or other patient management decisions. A negative result may occur with  improper specimen collection/handling, submission of specimen other than nasopharyngeal swab, presence of viral mutation(s) within the areas targeted by this assay, and inadequate number of viral copies(<138 copies/mL). A negative result must be combined with clinical observations, patient history, and  epidemiological information. The expected result is Negative.  Fact Sheet for Patients:  EntrepreneurPulse.com.au  Fact Sheet for Healthcare Providers:  IncredibleEmployment.be  This test is no t yet approved or cleared by the [place] FDA and  has been authorized for detection and/or diagnosis of SARS-CoV-2 by FDA under an Emergency Use Authorization (EUA). This EUA will remain  in effect (meaning this test can be used) for the duration of the COVID-19 declaration under Section 564(b)(1) of the Act, 21 U.S.C.section 360bbb-3(b)(1), unless the authorization is terminated  or revoked sooner.       Influenza A by PCR NEGATIVE NEGATIVE Final   Influenza B by PCR NEGATIVE NEGATIVE Final    Comment: (NOTE) The Xpert Xpress SARS-CoV-2/FLU/RSV plus assay is intended as an aid in the diagnosis of influenza from Nasopharyngeal swab specimens and should not be used as a sole basis for treatment. Nasal washings and aspirates are unacceptable for Xpert Xpress SARS-CoV-2/FLU/RSV testing.  Fact Sheet for Patients: EntrepreneurPulse.com.au  Fact Sheet for Healthcare Providers: IncredibleEmployment.be  This test is not yet approved or cleared by the [place] FDA and has been authorized for detection and/or diagnosis of SARS-CoV-2 by FDA under an Emergency Use Authorization (EUA). This EUA will remain in effect (meaning this test can be used) for the duration of the COVID-19 declaration under Section 564(b)(1) of the Act, 21 U.S.C. section 360bbb-3(b)(1), unless the authorization is terminated or revoked.  Performed at [facility], [place] [address] [place], [place] [address]           Radiology Studies: No results found.      Scheduled Meds:  amLODipine  10 mg Per Tube Daily   apixaban  5 mg Per Tube BID   atorvastatin  40 mg Per Tube Daily   erythromycin   Right Eye TID   feeding  supplement (PROSource TF)  45 mL Per Tube BID   free water  200 mL Per Tube Q4H   hydrALAZINE  25 mg Per Tube QID   insulin  aspart  0-9 Units Subcutaneous Q4H   levETIRAcetam  250 mg Per Tube BID   mouth rinse  15 mL Mouth Rinse BID   metoprolol tartrate  12.5 mg Per Tube BID   modafinil  100 mg Per Tube Daily   nystatin  5 mL Mouth/Throat QID   pantoprazole sodium  40 mg Per Tube Daily   topiramate  50 mg Per Tube BID   Continuous Infusions:  ceFEPime (MAXIPIME) IV 2 g (11/15/20 0850)   feeding supplement (JEVITY 1.5 CAL/FIBER) 1,000 mL (11/14/20 2241)     LOS: 32 days    Time spent: 35 minutes.     Elmarie Shiley, MD [facility]   If 7PM-7AM, please contact night-coverage [email or web address]  11/15/2020, 2:12 PM

## 2020-11-15 NOTE — TOC Progression Note (Signed)
LATE NOTE SUBMISSION   Transition of Care [facility]) - Progression Note    Patient Details  Name: Clifford [name] MRN: [id number] Date of Birth: 21-Mar-1950  Transition of Care [facility]) CM/SW [place], [place] Phone Number: 11/15/2020, 10:40 AM  Clinical Narrative:   CSW completed incompetency letter for Medicaid application, and to assist brother with completing conservatorship over the patient. CSW provided patient with a copy and scanned the letter to financial counseling to complete Medicaid application. CSW to follow.    Expected Discharge Plan: Skilled Nursing Facility Barriers to Discharge: [place] (PASRR), Continued Medical Work up, Ship broker, Inadequate or no Biomedical scientist Expected Discharge Plan: [place] Choice: [place] arrangements for the past 2 months: Single Family Home                                       Social Determinants of Health (SDOH) Interventions    Readmission Risk Interventions No flowsheet data found.

## 2020-11-15 NOTE — Progress Notes (Signed)
Occupational Therapy Treatment Patient Details Name: Clifford [name] MRN: [id number] DOB: 23-Jan-1951 Today's Date: 11/15/2020   History of present illness 70 yo male presented to ED on 8/11 for R sided headache and worsening L UE weakness. Patient recently admitted 8/3-8/5 for R CVA with L visual deficits and L sided weakness. MRI with mild enlargement of R MCA/border zone infarct since 10/07/20 MRI and increased cytotoxic edema and petechial hemorrhage without midline shift. Palliative Care consulted and in detailed family discussions, with prognosis provided by Neurology (more likely able to regain ability to talk and participate in self cares, unlikely to regain ability to walk, uncertain if able to regain ability to swallow, very unlikely to improve to full independence), it was decided to place PEG and pursue SNF rehab, PEG was placed 8/30. 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   OT comments  Pt received in bed, agreeable to therapy session this date. Session was limited with progression of mobility secondary to pt incontinent of bowels x2 during session requiring totalA+2 for rolling in bed and totalA for posterior/pericare. Noted penile lesions and notified RN. Pt verbalizing more this session and following one step commands about 25% of the time with increased time, pt more alert this session but appeared to be more distracted in minimally distracting environment. Pt will continue to benefit from skilled OT services to maximize safety and independence with ADL/IADL and functional mobility. Will continue to follow acutely and progress as tolerated.    Recommendations for follow up therapy are one component of a multi-disciplinary discharge planning process, led by the attending physician.  Recommendations may be updated based on patient status, additional functional criteria and insurance authorization.    Follow Up Recommendations  SNF    Equipment Recommendations  None recommended by OT     Recommendations for Other Services PT consult    Precautions / Restrictions Precautions Precautions: Fall Precaution Comments: L visual field deficit, R gaze preference, L inattention, L hemparesis; G-tube and abdominal binder; sacral/penile skin breakdown from long term condom cath use/moisture Required Braces or Orthoses:  (abdominal binder) Restrictions Weight Bearing Restrictions: No       Mobility Bed Mobility Overal bed mobility: Needs Assistance Bed Mobility: Rolling Rolling: +2 for physical assistance;Total assist         General bed mobility comments: totalA+2 for rolling R<>L, assist to guide RUE to bed rail and cues to locate bedrail on left side    Transfers                 General transfer comment: limited mobility this session secondary to need for posterior care X2 during session    Balance                                           ADL either performed or assessed with clinical judgement   ADL Overall ADL's : Needs assistance/impaired     Grooming: Moderate assistance;Wash/dry hands Grooming Details (indicate cue type and reason): modA to wash/dry hands         Upper Body Dressing : Maximal assistance   Lower Body Dressing: Total assistance;Bed level   Toilet Transfer: Total assistance;+2 for physical assistance;+2 for safety/equipment Toilet Transfer Details (indicate cue type and reason): rolling R<>L in bed for posterior care, pt had BM Toileting- Clothing Manipulation and Hygiene: Total assistance;+2 for physical assistance;+2 for  safety/equipment;Bed level Toileting - Clothing Manipulation Details (indicate cue type and reason): totalA for posterior/pericare     Functional mobility during ADLs: Cueing for safety;Cueing for sequencing;Maximal assistance;+2 for physical assistance General ADL Comments: Pt limited this session secondary to 2x incontinence of bowels.     Vision   Vision Assessment?:  Yes Alignment/Gaze Preference: Head turned;Gaze right Visual Fields: Left homonymous hemianopsia   Perception     Praxis      Cognition Arousal/Alertness: Lethargic Behavior During Therapy: WFL for tasks assessed/performed Overall Cognitive Status: Difficult to assess Area of Impairment: Attention;Safety/judgement;Awareness;Problem solving;Following commands                   Current Attention Level: Sustained Memory: Decreased recall of precautions;Decreased short-term memory Following Commands: Follows one step commands inconsistently;Follows one step commands with increased time Safety/Judgement: Decreased awareness of deficits;Decreased awareness of safety Awareness: Intellectual Problem Solving: Slow processing;Difficulty sequencing;Requires verbal cues;Requires tactile cues;Decreased initiation General Comments: Pt appeared more alert this date, keeping eyes open 75% of session, pt with increased verbalization this date with one word answers. Pt following one step commands about 25% of the time, appeared to be distractable;pt able to grasp bed rail with RUE with maxA to locate rail. Pt required about 13mn to state his last name.        Exercises Exercises: Other exercises Other Exercises Other Exercises: RLE hip abduction/adduction;knee flexion/extension AAROM x5 Other Exercises: RUE shoulder flexion AAROM Other Exercises: LUE elbow flexion/extension PROM x10   Shoulder Instructions       General Comments vss on RA    Pertinent Vitals/ Pain       Pain Assessment: Faces Faces Pain Scale: Hurts little more Pain Location: generalized, peri-area with clean up due to raw areas Pain Descriptors / Indicators: Grimacing;Guarding Pain Intervention(s): Limited activity within patient's tolerance;Monitored during session  Home Living                                          Prior Functioning/Environment              Frequency  Min 2X/week         Progress Toward Goals  OT Goals(current goals can now be found in the care plan section)  Progress towards OT goals: Progressing toward goals  Acute Rehab OT Goals Patient Stated Goal: None stated OT Goal Formulation: Patient unable to participate in goal setting Time For Goal Achievement: 11/25/20 Potential to Achieve Goals: Fair ADL Goals Pt Will Perform Grooming: with mod assist;sitting;bed level Pt Will Perform Lower Body Bathing: with mod assist;bed level Pt Will Perform Lower Body Dressing: with mod assist;sitting/lateral leans Pt Will Transfer to Toilet: with mod assist Pt Will Perform Toileting - Clothing Manipulation and hygiene: with mod assist;bed level Additional ADL Goal #1: Pt will locate three grooming items in left visual field with Min cues Additional ADL Goal #2: Pt will demonstrate emergent awareness during ADLs with Min cues  Plan Discharge plan remains appropriate    Co-evaluation    PT/OT/SLP Co-Evaluation/Treatment: Yes Reason for Co-Treatment: Complexity of the patient's impairments (multi-system involvement);For patient/therapist safety;To address functional/ADL transfers   OT goals addressed during session: ADL's and self-care      AM-PAC OT "6 Clicks" Daily Activity     Outcome Measure   Help from another person eating meals?: Total Help from another person taking care of personal grooming?:  A Lot Help from another person toileting, which includes using toliet, bedpan, or urinal?: Total Help from another person bathing (including washing, rinsing, drying)?: Total Help from another person to put on and taking off regular upper body clothing?: A Lot Help from another person to put on and taking off regular lower body clothing?: Total 6 Click Score: 8    End of Session    OT Visit Diagnosis: Unsteadiness on feet (R26.81);Muscle weakness (generalized) (M62.81);Other symptoms and signs involving cognitive function   Activity Tolerance  Patient tolerated treatment well   Patient Left in bed;with call [place]/phone within reach;with bed alarm set   Nurse Communication Mobility status        Time: 1020-1057 OT Time Calculation (min): 37 min  Charges: OT General Charges $OT Visit: 1 Visit OT Treatments $Self Care/Home Management : 8-22 mins  Helene Kelp OTR/L Acute Rehabilitation Services Office: [place] 11/15/2020, 12:39 PM

## 2020-11-15 NOTE — Progress Notes (Signed)
Physical Therapy Treatment Patient Details Name: Clifford [name] MRN: [id number] DOB: [birth year] Today's Date: 11/15/2020   History of Present Illness 69 yo male presented to ED on 8/11 for R sided headache and worsening L UE weakness. Patient recently admitted 8/3-8/5 for R CVA with L visual deficits and L sided weakness. MRI with mild enlargement of R MCA/border zone infarct since 10/07/20 MRI and increased cytotoxic edema and petechial hemorrhage without midline shift. Palliative Care consulted and in detailed family discussions, with prognosis provided by Neurology (more likely able to regain ability to talk and participate in self cares, unlikely to regain ability to walk, uncertain if able to regain ability to swallow, very unlikely to improve to full independence), it was decided to place PEG and pursue SNF rehab, PEG was placed 8/30. 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    PT Comments    Pt received in supine, drowsy but awoken with loud voice and increased time to awaken, pt limited due to frequent loose stools this date and need for cleanup. Primary session focus on bed mobility, UE/LE AA/PROM and repositioning for pressure relief. Pt with notable increased skin breakdown at tip and base (posterior) of penis, RN and MD notified (seems to be at site where male purewick is placed). Pt needing totalA +2 today for bed mobility and peri-care/cleanup assist. UTA seated activity due to pt lethargy and incontinence. Pt continues to benefit from PT services to progress toward functional mobility goals. Plan to assess seated/standing balance next session.   Recommendations for follow up therapy are one component of a multi-disciplinary discharge planning process, led by the attending physician.  Recommendations may be updated based on patient status, additional functional criteria and insurance authorization.  Follow Up Recommendations  SNF;Supervision/Assistance - 24 hour     Equipment  Recommendations  Other (comment) (TBA; currently could benefit from specialized wheelchair eval, mechanical lift, and hospital bed)    Recommendations for Other Services       Precautions / Restrictions Precautions Precautions: Fall Precaution Comments: L visual field deficit, R gaze preference, L inattention, L hemparesis; G-tube and abdominal binder; penile skin breakdown from long term condom cath use/moisture Required Braces or Orthoses:  (abdominal binder) Restrictions Weight Bearing Restrictions: No     Mobility  Bed Mobility Overal bed mobility: Needs Assistance Bed Mobility: Rolling Rolling: +2 for physical assistance;Total assist         General bed mobility comments: totalA+2 for rolling R<>L, assist to guide RUE to bed rail and cues to locate bedrail on left side, able to grasp rail once in L sidelying but unable to pull himself toward rail today. Rolling to each side x3 reps ea during hygiene assist (pt incontinent with bed mobility); totalA +2 for posterior supine scooting toward [facility] although he was able to place RUE on railing pt not assisting to pull up with bed in trendelenburg    Transfers                 General transfer comment: limited mobility this session secondary to need for posterior care with bowel incontinence X2 during session  Ambulation/Gait                 Stairs             Wheelchair Mobility    Modified Rankin (Stroke Patients Only) Modified Rankin (Stroke Patients Only) Pre-Morbid Rankin Score: Moderately severe disability Modified Rankin: Severe disability     Balance  Cognition Arousal/Alertness: Lethargic Behavior During Therapy: WFL for tasks assessed/performed Overall Cognitive Status: Difficult to assess Area of Impairment: Attention;Safety/judgement;Awareness;Problem solving;Following commands                   Current Attention  Level: Sustained Memory: Decreased recall of precautions;Decreased short-term memory Following Commands: Follows one step commands inconsistently;Follows one step commands with increased time Safety/Judgement: Decreased awareness of deficits;Decreased awareness of safety Awareness: Intellectual Problem Solving: Slow processing;Difficulty sequencing;Requires verbal cues;Requires tactile cues;Decreased initiation General Comments: Pt appeared more alert this date, keeping eyes open 75% of session, pt with increased verbalization this date with one word answers. Pt following one step commands about 25% of the time, appeared to be distractable;pt able to grasp bed rail with RUE with maxA to locate rail. Pt required about 3 mins to state his last name and unable to state current month or birthday.      Exercises Other Exercises Other Exercises: RLE hip abduction/adduction;knee flexion/extension AAROM x5 Other Exercises: RUE shoulder flexion AAROM x3 reps Other Exercises: R hand gross grasp/finger extension x10 reps    General Comments General comments (skin integrity, edema, etc.): VSS on RA      Pertinent Vitals/Pain Pain Assessment: Faces Faces Pain Scale: Hurts little more Pain Location: generalized, peri-area with clean up due to raw areas and skin breakdown on penis (tip and posterior surface near base) Pain Descriptors / Indicators: Grimacing;Guarding Pain Intervention(s): Monitored during session;Repositioned;Other (comment) (RN notified pt needs wound care assessment of these areas)    Home Living                      Prior Function            PT Goals (current goals can now be found in the care plan section) Acute Rehab PT Goals Patient Stated Goal: None stated PT Goal Formulation: With patient Time For Goal Achievement: 11/26/20 Progress towards PT goals: Not progressing toward goals - comment    Frequency    Min 3X/week      PT Plan Current plan remains  appropriate    Co-evaluation PT/OT/SLP Co-Evaluation/Treatment: Yes Reason for Co-Treatment: Complexity of the patient's impairments (multi-system involvement);Necessary to address cognition/behavior during functional activity;For patient/therapist safety;To address functional/ADL transfers PT goals addressed during session: Mobility/safety with mobility;Balance;Strengthening/ROM OT goals addressed during session: ADL's and self-care      AM-PAC PT "6 Clicks" Mobility   Outcome Measure  Help needed turning from your back to your side while in a flat bed without using bedrails?: Total Help needed moving from lying on your back to sitting on the side of a flat bed without using bedrails?: Total Help needed moving to and from a bed to a chair (including a wheelchair)?: Total Help needed standing up from a chair using your arms (e.g., wheelchair or bedside chair)?: Total Help needed to walk in hospital room?: Total Help needed climbing 3-5 steps with a railing? : Total 6 Click Score: 6    End of Session Equipment Utilized During Treatment: Other (comment) (bed transfer pads) Activity Tolerance: Patient limited by lethargy;Other (comment) (frequent loose stools limiting mobility status today) Patient left: in bed;with call Bert/phone within reach;with bed alarm set Nurse Communication: Mobility status;Other (comment);Need for lift equipment (loose stools, skin breakdown looks worse on penis (where male purewick meets up with posterior surface and at tip, MD also notified)) PT Visit Diagnosis: Unsteadiness on feet (R26.81);Muscle weakness (generalized) (M62.81);Other symptoms and signs involving the nervous system (R29.898);Other  abnormalities of gait and mobility (R26.89)     Time: [id number] PT Time Calculation (min) (ACUTE ONLY): 34 min  Charges:  $Therapeutic Activity: 8-22 mins                     Hooria Gasparini P., PTA Acute Rehabilitation Services Pager: [phone] Office: [place] 11/15/2020, 2:09 PM

## 2020-11-16 ENCOUNTER — Inpatient Hospital Stay (HOSPITAL_COMMUNITY): Payer: No Typology Code available for payment source

## 2020-11-16 DIAGNOSIS — R531 Weakness: Secondary | ICD-10-CM | POA: Diagnosis not present

## 2020-11-16 LAB — GLUCOSE, CAPILLARY
Glucose-Capillary: 97 mg/dL (ref 70–99)
Glucose-Capillary: 114 mg/dL — ABNORMAL HIGH (ref 70–99)
Glucose-Capillary: 117 mg/dL — ABNORMAL HIGH (ref 70–99)
Glucose-Capillary: 119 mg/dL — ABNORMAL HIGH (ref 70–99)
Glucose-Capillary: 113 mg/dL — ABNORMAL HIGH (ref 70–99)

## 2020-11-16 LAB — BASIC METABOLIC PANEL
Anion gap: 8 (ref 5–15)
BUN: 32 mg/dL — ABNORMAL HIGH (ref 8–23)
CO2: 19 mmol/L — ABNORMAL LOW (ref 22–32)
Calcium: 8.4 mg/dL — ABNORMAL LOW (ref 8.9–10.3)
Chloride: 115 mmol/L — ABNORMAL HIGH (ref 98–111)
Creatinine, Ser: 1.28 mg/dL — ABNORMAL HIGH (ref 0.61–1.24)
GFR, Estimated: 59 mL/min — ABNORMAL LOW (ref >60)
Glucose, Bld: 136 mg/dL — ABNORMAL HIGH (ref 70–99)
Potassium: 3.5 mmol/L (ref 3.5–5.1)
Sodium: 142 mmol/L (ref 135–145)

## 2020-11-16 LAB — CBC
HCT: 34.8 % — ABNORMAL LOW (ref 39.0–52.0)
Hemoglobin: 11.1 g/dL — ABNORMAL LOW (ref 13.0–17.0)
MCH: 26.1 pg (ref 26.0–34.0)
MCHC: 31.9 g/dL (ref 30.0–36.0)
MCV: 81.9 fL (ref 80.0–100.0)
Platelets: 91 10*3/uL — ABNORMAL LOW (ref 150–400)
RBC: 4.25 MIL/uL (ref 4.22–5.81)
RDW: 14.8 % (ref 11.5–15.5)
WBC: 6.4 10*3/uL (ref 4.0–10.5)
nRBC: 0 % (ref 0.0–0.2)

## 2020-11-16 NOTE — Progress Notes (Signed)
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  Had an initial stroke on 8/3, was admitted for 2 days then discharged with home health.  After about 4 days at home, he developed worsening of his stroke symptoms, return to the hospital.  In the ER, found to have enlargement of his previous stroke with microhemorrhage.  Initially had relatively mild deficits still, NIHSS 4, so his Pradaxa was held and he was admitted for SNF placement for short-term rehab.  However, he developed progressively worsening left hemiparesis, then somnolence.  Neurology were reconsulted.  Serial CT head on 8/16 showed expected evolution/progression of his stroke, further edema but no herniation or  substantial midline shift. MRI ; 8/25: increasing midline shift.  Neurology following the patient and recommended further supportive care and SNF placement.  NG tube was placed as patient lost ability to swallow.  Palliative care were consulted and in detail family discussion, with prognosis provided by neurology (more likely able to regain ability to talk and participate in self-care, unlikely to regain ability to walk, uncertain if able to regain ability to swallow, very unlikely to improve to full independence), it was decided to place PEG and pursue a SNF for rehab.  Awaiting skilled nursing facility.   Assessment & Plan:   Principal Problem:   Left-sided weakness Active Problems:   Stroke [facility])   Essential hypertension   Mixed hyperlipidemia   Prediabetes   History of pulmonary embolism   Chronic anticoagulation   Nicotine dependence, cigarettes, uncomplicated   Petechial hemorrhage   Acute stroke due to  ischemia [facility])   Advanced care planning/counseling discussion   Goals of care, counseling/discussion   Palliative care by specialist   1-Increase petechial hemorrhages/evolution of previous right MCA/PCA territory infarct with mild edema with now increasing midline shift on repeat MRI 10/28/2020: -MRI mild enlargement with cytotoxic edema and petechial hemorrhage with increased midline shift compared to 10/07/2020. -Neurology suspect his current poor functional status is mostly due to the swelling, with a component of this from free water and a component of this from bleeding. -He remains with significant dysphagia, PEG tube placed by IR 8/30 -Patient was a started Topomax headache. -Repeat CT head 8/30 showing progressive CVA, midline shift is 10 mm as opposed to 14 mm on MRI 8/25. -Due to sinus tachycardia of unclear cause, CT head was repeated on 9/2 which showed a stable CVA and midline shift is now 6 mm. Awaiting skilled nursing facility He was more alert 9/09,  he was able to vocalized words and voice was stronger.  He was more sleepy today, not responding to questions. Repeated CT head 9/13; stable.   2-Fever at 102 on 9/10:  -Pan culture.  -Started  IV antibiotics 9/10 vanc and cefepime. Vancomycin DC 9/11. -Chest x ray no acute diseases, low lung volume..  -UA; Negative, Urine culture: 10,000 colonies.  -Blood culture: No growth To date.  -Covid test negative 9/10. -MRSA PCR negative.  Plan to treat for 5 days of cefepime. Day 4/5  HTN, Tachycardia:   -Continue with amlodipine and hydralazine -Started Metoprolol.   3-Suspected  focal seizure: Presented with left hand twitching which was nonsuppressible and clinically suspicious for stroke and was a started  on Keppra. Keppra Reduce 8/24 to eliminate  sedating side effect from Keppra.  Provigil  90/3.   Cough; Chest x ray no active disease.  Flutter valve  Hypernatremia: Due to decreased free water, monitor overall  stable. Discussed with Dr Erlinda Hong, ok to start correcting sodium level with free water.  Sodium improving. Down to 143.  Stop IV fluids.   Acute kidney injury on CKD stage III A: Creatinine 1.2-1.4 Monitor cr. 1.7---1.4 Improving.   History of recurrent VTE: Continue with Eliquis.  Dysphagia: Continue PEG tube  Lung nodule: Incidental finding of 8 mm left upper lobe nodule.  Need to follow-up CT 6 to 12 months depending on neurological progression  Possible sclerotic vertebra on imaging: Incidental finding.  PSA was 5.  Follow-up as an outpatient  Chronic diastolic heart failure: Euvolemic Hyperkalemia; Resolved with Lokelma Right eye pain: With some scleral injection, case was discussed with ophthalmology Dr. Posey Pronto, will think likely related to abrasion from a stroke and it remains open. Thrombocytopenia; stable. Monitor.  GI, Loose Stool;  Nutritionist consulted, to see if formula can be change.  Started  florastore.  Stool is not watery to required rectal tube.   Nutrition Problem: Inadequate oral intake Etiology: dysphagia    Signs/Symptoms: NPO status    Interventions: Tube feeding, Prostat  Estimated body mass index is 31.33 kg/m as calculated from the following:   Height as of this encounter: '5\' 5"'$  (1.651 m).   Weight as of this encounter: 85.4 kg.   DVT prophylaxis: Eliquis Code Status: DNR Family Communication: Care discussed with brother who was at bedside 9/13 Disposition Plan:  Status is: Inpatient  Remains inpatient appropriate because:IV treatments appropriate due to intensity of illness or inability to take PO  Dispo: The patient is from: Home              Anticipated d/c is to: SNF              Patient currently is medically stable to d/c.   Difficult to place patient No        Consultants:  Neurology Palliative  Procedures:  Peg tube placement. 8/30.  Antimicrobials:  None  Subjective: Sleepy this morning, not answering questions.   Per brother he wake up more and try to say few words later.   Objective: Vitals:   11/15/20 2323 11/16/20 0331 11/16/20 0500 11/16/20 0739  BP: 110/63 133/68  (!) 149/77  Pulse: 61 63  75  Resp: '18 18  19  '$ Temp: 99 F (37.2 C) 99.2 F (37.3 C)  98.5 F (36.9 C)  TempSrc: Oral Oral  Oral  SpO2: 100% 100%  99%  Weight:   85.4 kg   Height:        Intake/Output Summary (Last 24 hours) at 11/16/2020 1256 Last data filed at 11/16/2020 0457 Gross per 24 hour  Intake --  Output 800 ml  Net -800 ml    Filed Weights   11/14/20 0500 11/15/20 0512 11/16/20 0500  Weight: 87.2 kg 86.2 kg 85.4 kg    Examination:  General exam:NAD Respiratory system: BL [place] Cardiovascular system:  S 1, S 2 RRR Gastrointestinal system: BS present, soft, nt Central nervous system: sleepy ;left side weakness Extremities: No edema  Data Reviewed: I have personally reviewed following labs and imaging studies  CBC: Recent Labs  Lab 11/10/20 0247 11/12/20 0304 11/13/20 0902 11/15/20 0353 11/16/20 1017  WBC 6.4 7.6 5.5 7.4 6.4  HGB 13.3 12.4* 10.7* 11.2* 11.1*  HCT 44.4 40.4 35.8* 35.6* 34.8*  MCV 86.0 85.4 86.5 83.8 81.9  PLT 127* 92* 91* 93* 91*    Basic Metabolic Panel: Recent Labs  Lab 11/12/20 0304 11/13/20 0902 11/14/20 0222 11/15/20 0353 11/16/20 1017  NA 151* 148* 147* 143 142  K 3.9 3.7 3.9 4.6 3.5  CL 124* 122* 123* 118* 115*  CO2 21* 19* 18* 20* 19*  GLUCOSE 130* 142* 124* 103* 136*  BUN 54* 52* 47* 38* 32*  CREATININE 1.70* 1.70* 1.46* 1.46* 1.28*  CALCIUM 8.7* 8.0* 8.2* 8.3* 8.4*    GFR: Estimated Creatinine Clearance: 52.5 mL/min (A) (by C-G formula based on SCr of 1.28 mg/dL (H)). Liver Function Tests: Recent Labs  Lab 11/12/20 0304  AST 97*  ALT 134*  ALKPHOS 69  BILITOT 0.6  PROT 6.3*  ALBUMIN 2.5*    No results for input(s): LIPASE, AMYLASE in the last 168 hours. No results for input(s): AMMONIA in the last 168 hours. Coagulation Profile: No  results for input(s): INR, PROTIME in the last 168 hours. Cardiac Enzymes: No results for input(s): CKTOTAL, CKMB, CKMBINDEX, TROPONINI in the last 168 hours. BNP (last 3 results) No results for input(s): PROBNP in the last 8760 hours. HbA1C: No results for input(s): HGBA1C in the last 72 hours. CBG: Recent Labs  Lab 11/15/20 1717 11/15/20 2101 11/15/20 2334 11/16/20 0409 11/16/20 0742  GLUCAP 108* 110* 114* 97 114*    Lipid Profile: No results for input(s): CHOL, HDL, LDLCALC, TRIG, CHOLHDL, LDLDIRECT in the last 72 hours. Thyroid Function Tests: No results for input(s): TSH, T4TOTAL, FREET4, T3FREE, THYROIDAB in the last 72 hours. Anemia Panel: No results for input(s): VITAMINB12, FOLATE, FERRITIN, TIBC, IRON, RETICCTPCT in the last 72 hours. Sepsis Labs: No results for input(s): PROCALCITON, LATICACIDVEN in the last 168 hours.  Recent Results (from the past 240 hour(s))  Urine Culture     Status: Abnormal   Collection Time: 11/13/20  8:24 AM   Specimen: Urine, Clean Catch  Result Value Ref Range Status   Specimen Description URINE, CLEAN CATCH  Final   Special Requests NONE  Final   Culture (A)  Final    <10,000 COLONIES/mL INSIGNIFICANT GROWTH Performed at [facility], [address], [place], [place] [address]    Report Status 11/14/2020 FINAL  Final  MRSA Next Gen by PCR, Nasal     Status: None   Collection Time: 11/13/20  8:25 AM   Specimen: Nasal Mucosa; Nasal Swab  Result Value Ref Range Status   MRSA by PCR Next Gen NOT DETECTED NOT DETECTED Final    Comment: (NOTE) The GeneXpert MRSA Assay (FDA approved for NASAL specimens only), is one component of a comprehensive MRSA colonization surveillance program. It is not intended to diagnose MRSA infection nor to guide or monitor treatment for MRSA infections. Test performance is not FDA approved in patients less than 88 years old. Performed at [facility], [place] [address], [place],  [place] [address]   Culture, blood (routine x 2)     Status: None (Preliminary result)   Collection Time: 11/13/20  9:02 AM   Specimen: BLOOD  Result Value Ref Range Status   Specimen Description BLOOD RIGHT ANTECUBITAL  Final   Special Requests   Final    BOTTLES DRAWN AEROBIC ONLY Blood Culture adequate volume   Culture   Final    NO GROWTH 3 DAYS Performed at [facility], [place] [address], [place], [place] [address]  Report Status PENDING  Incomplete  Culture, blood (routine x 2)     Status: None (Preliminary result)   Collection Time: 11/13/20  9:08 AM   Specimen: BLOOD  Result Value Ref Range Status   Specimen Description BLOOD LEFT ANTECUBITAL  Final   Special Requests   Final    BOTTLES DRAWN AEROBIC AND ANAEROBIC Blood Culture adequate volume   Culture   Final    NO GROWTH 3 DAYS Performed at [facility], [address] [place], [place] [address]    Report Status PENDING  Incomplete  Resp Panel by RT-PCR (Flu A&B, Covid) Nasopharyngeal Swab     Status: None   Collection Time: 11/13/20  5:07 PM   Specimen: Nasopharyngeal Swab; Nasopharyngeal(NP) swabs in vial transport medium  Result Value Ref Range Status   SARS Coronavirus 2 by RT PCR NEGATIVE NEGATIVE Final    Comment: (NOTE) SARS-CoV-2 target nucleic acids are NOT DETECTED.  The SARS-CoV-2 RNA is generally detectable in upper respiratory specimens during the acute phase of infection. The lowest concentration of SARS-CoV-2 viral copies this assay can detect is 138 copies/mL. A negative result does not preclude SARS-Cov-2 infection and should not be used as the sole basis for treatment or other patient management decisions. A negative result may occur with  improper specimen collection/handling, submission of specimen other than nasopharyngeal swab, presence of viral mutation(s) within the areas targeted by this assay, and inadequate number of viral copies(<138 copies/mL). A negative result must be combined  with clinical observations, patient history, and epidemiological information. The expected result is Negative.  Fact Sheet for Patients:  EntrepreneurPulse.com.au  Fact Sheet for Healthcare Providers:  IncredibleEmployment.be  This test is no t yet approved or cleared by the [place] FDA and  has been authorized for detection and/or diagnosis of SARS-CoV-2 by FDA under an Emergency Use Authorization (EUA). This EUA will remain  in effect (meaning this test can be used) for the duration of the COVID-19 declaration under Section 564(b)(1) of the Act, 21 U.S.C.section 360bbb-3(b)(1), unless the authorization is terminated  or revoked sooner.       Influenza A by PCR NEGATIVE NEGATIVE Final   Influenza B by PCR NEGATIVE NEGATIVE Final    Comment: (NOTE) The Xpert Xpress SARS-CoV-2/FLU/RSV plus assay is intended as an aid in the diagnosis of influenza from Nasopharyngeal swab specimens and should not be used as a sole basis for treatment. Nasal washings and aspirates are unacceptable for Xpert Xpress SARS-CoV-2/FLU/RSV testing.  Fact Sheet for Patients: EntrepreneurPulse.com.au  Fact Sheet for Healthcare Providers: IncredibleEmployment.be  This test is not yet approved or cleared by the [place] FDA and has been authorized for detection and/or diagnosis of SARS-CoV-2 by FDA under an Emergency Use Authorization (EUA). This EUA will remain in effect (meaning this test can be used) for the duration of the COVID-19 declaration under Section 564(b)(1) of the Act, 21 U.S.C. section 360bbb-3(b)(1), unless the authorization is terminated or revoked.  Performed at [facility], [place] [address], [place], [place] [address]           Radiology Studies: CT HEAD WO CONTRAST (5MM)  Result Date: 11/16/2020 CLINICAL DATA:  Mental status change, unknown cause EXAM: CT HEAD WITHOUT CONTRAST TECHNIQUE:  Contiguous axial images were obtained from the base of the skull through the vertex without intravenous contrast. COMPARISON:  11/05/2020. FINDINGS: Mild to moderately motion limited study. Brain: Similar evolving right posterior MCA territory infarct with associated edema and loss  of gray-white differentiation. Similar scattered curvilinear hyperdensity within the infarct, compatible with petechial hemorrhage. Similar leftward midline shift with approximately 6 mm at the foramen of Monro. Similar effacement of the right lateral ventricle. No progressive ventriculomegaly. No evidence of new/interval acute large vascular territorial infarct. No evidence of progressive hemorrhage. No visible extra-axial fluid collection. Basal cisterns are patent. Vascular: Calcific intracranial atherosclerosis. Skull: No acute fracture. Sinuses/Orbits: Mild paranasal sinus mucosal thickening. No acute orbital findings. Other: No mastoid effusions. IMPRESSION: Similar large evolving right posterior MCA territory infarct with similar 6 mm leftward midline shift and petechial hemorrhage. Electronically Signed   By: Margaretha Sheffield M.D.   On: 11/16/2020 12:43        Scheduled Meds:  amLODipine  10 mg Per Tube Daily   apixaban  5 mg Per Tube BID   atorvastatin  40 mg Per Tube Daily   erythromycin   Right Eye TID   feeding supplement (PROSource TF)  45 mL Per Tube BID   free water  200 mL Per Tube Q4H   hydrALAZINE  25 mg Per Tube QID   insulin aspart  0-9 Units Subcutaneous Q4H   levETIRAcetam  250 mg Per Tube BID   mouth rinse  15 mL Mouth Rinse BID   metoprolol tartrate  12.5 mg Per Tube BID   modafinil  100 mg Per Tube Daily   nystatin  5 mL Mouth/Throat QID   pantoprazole sodium  40 mg Per Tube Daily   saccharomyces boulardii  250 mg Per Tube BID   topiramate  50 mg Per Tube BID   Continuous Infusions:  ceFEPime (MAXIPIME) IV 2 g (11/16/20 1145)   feeding supplement (JEVITY 1.5 CAL/FIBER) 1,000 mL (11/15/20  1723)     LOS: 33 days    Time spent: 35 minutes.     Elmarie Shiley, MD [facility]   If 7PM-7AM, please contact night-coverage [email or web address]  11/16/2020, 12:56 PM

## 2020-11-16 NOTE — Progress Notes (Signed)
  Speech Language Pathology Treatment: Dysphagia;Cognitive-Linquistic  Patient Details Name: Clifford [name] MRN: [id number] DOB: 1950-05-24 Today's Date: 11/16/2020 Time: [id number] SLP Time Calculation (min) (ACUTE ONLY): 30 min  Assessment / Plan / Recommendation Clinical Impression  Pt seen at bedside for skilled ST intervention targeting goals for swallow function and cognitive linguistic skills. Pt was awake and alert during this session. Pt is currently NPO (due to dysphagia) with PEG tube feeds. No family present during this session.  Oral care was completed with suction, following which pt was given individual ice chips x5. Pt required ongoing cues to not talk while ice was in his mouth. Minimal oral manipulation was noted each time. Several times, pt swallowed the ice whole, and was instructed not to do that. No cough elicited during trials of ice chips.  Pt was educated on strategies to increase intelligibility, and stated "I did not know that", however, he was unable to carry over use of strategies even at the single word level. He did exhibit increased vocal intensity at one point, which effectively improved intelligibility. Pt was encouraged to use deep breathing to increased vocal loudness. Pt continues to exhibit right gaze preference, and was unable to turn his head to the left even with assistance. He was able to verbalize how to contact nursing ("by pushing that red button").   Pt continues to be markedly impaired. Continued ST intervention is recommended acutely, and after DC to next level of care.   HPI HPI: 70yo male admitted 10/14/20 with right side headache and LUE weakness. 10/19/20 pt was noted to have increased LUE weakness. 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 CTHead = evolving RMCA infarct with cytotoxic edema, mass effect      SLP Plan  Goals updated;Continue with current plan of care        Recommendations  Diet recommendations: NPO Medication Administration: Via alternative means                Oral Care Recommendations: Oral care QID Follow up Recommendations: Skilled Nursing facility SLP Visit Diagnosis: Dysphagia, oropharyngeal phase (R13.12);Dysarthria and anarthria (R47.1);Cognitive communication deficit (R41.841) Plan: Goals updated;Continue with current plan of care       Central. Quentin Ore, [facility], [place] Speech Language Pathologist Office: [phone]  Shonna Chock 11/16/2020, 2:26 PM

## 2020-11-16 NOTE — Progress Notes (Signed)
Nutrition Follow-up  DOCUMENTATION CODES:  Not applicable  INTERVENTION:  Continue TF via PEG: -Jevity 1.5 @ 22m/hr (13251md)  -4580mrosource TF BID -200m49mee water Q4H   Provides 2060 kcals, 106 grams protein, 1003ml44me water (2203ml 86ml free water with flushes)  NUTRITION DIAGNOSIS:  Inadequate oral intake related to dysphagia as evidenced by NPO status. -- ongoing  GOAL:  Patient will meet greater than or equal to 90% of their needs -- met with TF  MONITOR:  TF tolerance, Weight trends, Labs, I & O's  REASON FOR ASSESSMENT:  Consult Enteral/tube feeding initiation and management  ASSESSMENT:  Pt with a PMH significant for CHF, HTN, HLD, PE, recent R MCA/PCA watershed ischemic stroke admitted with evolving/increasing size of stroke, petechial hemorrhages, cytotoxic edema, concern for new seizures.  8/19 Cortrak placed (gastric tip) 8/30 PEG placed   Pt continues to tolerate TF via PEG. MD consulted RD due to pt developing diarrhea. Pt currently on fiber-containing formula, so transitioning to a different formula is unlikely to impact pt's bowel movements. More likely culprit of pt's recent diarrhea are antibiotics as pt recently initiated on cefepime. Discussed with MD.   Current TF regimen: Jevity 1.5 @ 55ml/h71m5ml Pr59mrce TF BID, 200ml fre83mter Q4H  UOP: 1400ml x24 41ms I/O: +5.5L since admit  Medications: protonix, SSI, florastor IVF: NS @ 50ml/hr La25mBUN 38 (H), Cr 1.46 (H) CBGs: 97-119 x24 hours  Diet Order:   Diet Order             Diet NPO time specified  Diet effective now                  EDUCATION NEEDS:  No education needs have been identified at this time  Skin:  Skin Assessment: Skin Integrity Issues: Skin Integrity Issues:: Other (Comment) Other: non-pressure wound base of penis  Last BM:  9/12  Height:  Ht Readings from Last 1 Encounters:  10/14/20 _0  (1.651 m)   Weight:  Wt Readings from Last 1 Encounters:   11/16/20 85.4 kg   Ideal Body Weight:  61.82 kg  BMI:  Body mass index is 31.33 kg/m.  Estimated Nutritional Needs:  Kcal:  2000-2200 Protein:  100-120 grams Fluid:  >2L    Clifford [name] AverLarkin InaDN (she/her/hers) RD pager number and weekend/on-call pager number located in [email or web address]

## 2020-11-17 DIAGNOSIS — R531 Weakness: Secondary | ICD-10-CM | POA: Diagnosis not present

## 2020-11-17 LAB — GLUCOSE, CAPILLARY
Glucose-Capillary: 92 mg/dL (ref 70–99)
Glucose-Capillary: 95 mg/dL (ref 70–99)
Glucose-Capillary: 119 mg/dL — ABNORMAL HIGH (ref 70–99)
Glucose-Capillary: 123 mg/dL — ABNORMAL HIGH (ref 70–99)
Glucose-Capillary: 106 mg/dL — ABNORMAL HIGH (ref 70–99)

## 2020-11-17 NOTE — TOC Progression Note (Signed)
Transition of Care [facility]) - Progression Note    Patient Details  Name: Clifford [name] MRN: [id number] Date of Birth: 1950/04/12  Transition of Care [facility]) CM/SW [place], [place] Phone Number: 11/17/2020, 3:44 PM  Clinical Narrative:   CSW checked with financial counseling on Medicaid application. Financial counselor submitted application on [id number], but called today to check on status and apparently it was never received. Financial counselor faxed application again and will follow back in to ask about pending Medicaid number to provide to [place]. CSW to follow.    Expected Discharge Plan: Skilled Nursing Facility Barriers to Discharge: [place] (PASRR), Continued Medical Work up, Ship broker, Inadequate or no Biomedical scientist Expected Discharge Plan: [place] Choice: [place] arrangements for the past 2 months: Single Family Home                                       Social Determinants of Health (SDOH) Interventions    Readmission Risk Interventions No flowsheet data found.

## 2020-11-17 NOTE — Progress Notes (Addendum)
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  He had an initial stroke on 10/06/20, was admitted for 2 days then discharged with home health.  After about 4 days at home, he developed worsening of his stroke symptoms, returned to the hospital.  In the ER, He is found to have enlargement of his previous stroke with microhemorrhage.  Initially had relatively mild deficits , NIHSS 4, so his Pradaxa was held and he was admitted for SNF placement for short-term rehab.  However, he developed progressively worsening left hemiparesis, then somnolence.  Neurology were reconsulted.  Serial CT head on 8/16 showed expected evolution/progression of his stroke, further edema but no herniation or  substantial midline shift. MRI ; 8/25: increasing midline shift.  Neurology following the patient and recommended further supportive care and SNF placement.  NG tube was placed as patient lost ability to swallow.  Palliative care were consulted and in detail family discussion, with prognosis provided by neurology (more likely able to regain ability to talk and participate in self-care, unlikely to regain ability to walk, uncertain if able to regain ability to swallow, very unlikely to improve to full independence), it was decided to place PEG and pursue a SNF for rehab.   Awaiting skilled nursing facility.  Assessment & Plan:   Principal Problem:   Left-sided weakness Active Problems:   Stroke [facility])   Essential hypertension   Mixed hyperlipidemia   Prediabetes   History of pulmonary embolism   Chronic anticoagulation   Nicotine dependence, cigarettes, uncomplicated   Petechial hemorrhage    Acute stroke due to ischemia [facility])   Advanced care planning/counseling discussion   Goals of care, counseling/discussion   Palliative care by specialist  Progression of previous right MCA/PCA territory infarct with mild edema now with midline shift on repeat MRI 10/28/20. MRI showed mild enlargement with cytotoxic edema and petechial hemorrhage with increased midline shift compared to 10/07/2020. Neurology suspect his current poor functional status is mostly due to the swelling, with a component of this from free water and a component of this from bleeding. He remains with significant dysphagia, PEG tube placed by IR 8/30 Patient was started on Topamax for headache. Repeat CT head 8/30 showing progressive CVA, midline shift is 10 mm as opposed to 14 mm on MRI 8/25. Due to sinus tachycardia of unclear cause, CT head was repeated on 9/2 which showed a stable CVA and midline shift is now 6 mm. Awaiting skilled nursing facility. Patient has waxing and waning in his mental status.  He is alert , he was able to follow commands. Repeated CT head 9/13; stable.    Fever : Patient is spiked fever of 102.4 on 9/10. He was pancultured, started  IV antibiotics 9/10 vanc and cefepime. Vancomycin DC 9/11. Chest x ray no acute diseases, low lung volume..  UA; Negative, Urine culture: 10,000 colonies.  Blood culture: No growth To date.  Covid test negative 9/10. MRSA PCR negative.  Plan to treat for 5 days of cefepime. Day 5/5.   Hypertension Continue with amlodipine and hydralazine Started Metoprolol for tachycardia.   Suspected  focal seizure: Presented with left hand twitching which was nonsuppressible and clinically suspicious for stroke  and was started on Keppra. Keppra dose reduced on  8/24 to eliminate  sedating side effect from Keppra.  Provigil  90/3.    Cough; Chest x-ray unremarkable. Continue Flutter valve.   Hypernatremia: > Resolved. Due to decreased free water, monitor overall  stable. Discussed with Dr Erlinda Hong, ok to start correcting sodium level with free water.  Improved.  IV fluid discontinued.  Acute kidney injury on CKD stage III A: Baseline serum creatinine 1.2-1.4 Improved,  back to baseline.   History of VTE :  continue Eliquis.   Dysphagia :  Continue PEG tube.   Lung nodule: Incidental finding of 8 mm left upper lobe nodule.   Need to follow-up CT 6 to 12 months depending on neurological progression   Possible sclerotic vertebra on imaging: Incidental finding.  PSA was 5.  Follow-up as an outpatient.   Chronic diastolic heart failure: Appears euvolemic.  Hyperkalemia; Resolved with Lokelma.  Right eye pain: case was discussed with ophthalmology Dr. Posey Pronto, states could be related to abrasion from a stroke. Continue ointment.  Thrombocytopenia; stable. Monitor.   GI, Loose Stool;  Nutritionist consulted, to see if formula can be changed.  Started  florastore.  Stool is not watery to required rectal tube.    Nutrition Problem: Inadequate oral intake Etiology: dysphagia    DVT prophylaxis: Eliquis Code Status: DNR Family Communication: No family at bed side. Disposition Plan:  Status is: Inpatient  Remains inpatient appropriate because:Inpatient level of care appropriate due to severity of illness  Dispo: The patient is from: Home              Anticipated d/c is to: SNF              Patient currently is not medically stable to d/c.   Difficult to place patient No  Consultants:  Palliative  Neurology  Procedures: PEG tube placement Antimicrobials: None  Subjective: Patient was seen and examined at bedside.  Overnight events noted.   Patient reports feeling improved.  Alert and oriented x 2.  Objective: Vitals:   11/17/20 0000 11/17/20 0400 11/17/20 0714 11/17/20 1119  BP: (!) 150/74  (!) 150/74 (!) 152/86  Pulse: 72  86 85  Resp: '20  20 20  '$ Temp: 98.7 F (37.1 C)  98.6 F (37 C) 98.8 F (37.1 C)  TempSrc: Oral  Oral  Oral  SpO2: 100% 100% 100% 100%  Weight:      Height:        Intake/Output Summary (Last 24 hours) at 11/17/2020 1502 Last data filed at 11/17/2020 0600 Gross per 24 hour  Intake --  Output 1250 ml  Net -1250 ml   Filed Weights   11/14/20 0500 11/15/20 0512 11/16/20 0500  Weight: 87.2 kg 86.2 kg 85.4 kg    Examination:  General exam: Appears comfortable, not in any acute distress. Respiratory system: Clear to auscultation bilaterally, no wheezing. Cardiovascular system: S1-S2 heard, regular rate and rhythm, no murmur. Gastrointestinal system: Abdomen is soft, nontender, nondistended, BS+. Central nervous system: Alert and oriented x 2. No focal neurological deficits. Extremities: No edema, no cyanosis, no clubbing. Skin: No rashes, lesions or ulcers Psychiatry: Judgement and insight appear normal. Mood & affect appropriate.     Data Reviewed: I have personally reviewed following labs and imaging studies  CBC: Recent Labs  Lab 11/12/20 0304 11/13/20 0902 11/15/20 0353 11/16/20 1017  WBC 7.6 5.5 7.4 6.4  HGB 12.4* 10.7* 11.2* 11.1*  HCT 40.4 35.8* 35.6* 34.8*  MCV  85.4 86.5 83.8 81.9  PLT 92* 91* 93* 91*   Basic Metabolic Panel: Recent Labs  Lab 11/12/20 0304 11/13/20 0902 11/14/20 0222 11/15/20 0353 11/16/20 1017  NA 151* 148* 147* 143 142  K 3.9 3.7 3.9 4.6 3.5  CL 124* 122* 123* 118* 115*  CO2 21* 19* 18* 20* 19*  GLUCOSE 130* 142* 124* 103* 136*  BUN 54* 52* 47* 38* 32*  CREATININE 1.70* 1.70* 1.46* 1.46* 1.28*  CALCIUM 8.7* 8.0* 8.2* 8.3* 8.4*   GFR: Estimated Creatinine Clearance: 52.5 mL/min (A) (by C-G formula based on SCr of 1.28 mg/dL (H)). Liver Function Tests: Recent Labs  Lab 11/12/20 0304  AST 97*  ALT 134*  ALKPHOS 69  BILITOT 0.6  PROT 6.3*  ALBUMIN 2.5*   No results for input(s): LIPASE, AMYLASE in the last 168 hours. No results for input(s): AMMONIA in the last 168 hours. Coagulation Profile: No results for input(s): INR,  PROTIME in the last 168 hours. Cardiac Enzymes: No results for input(s): CKTOTAL, CKMB, CKMBINDEX, TROPONINI in the last 168 hours. BNP (last 3 results) No results for input(s): PROBNP in the last 8760 hours. HbA1C: No results for input(s): HGBA1C in the last 72 hours. CBG: Recent Labs  Lab 11/16/20 1756 11/16/20 2001 11/17/20 0223 11/17/20 0259 11/17/20 1203  GLUCAP 119* 113* 92 95 119*   Lipid Profile: No results for input(s): CHOL, HDL, LDLCALC, TRIG, CHOLHDL, LDLDIRECT in the last 72 hours. Thyroid Function Tests: No results for input(s): TSH, T4TOTAL, FREET4, T3FREE, THYROIDAB in the last 72 hours. Anemia Panel: No results for input(s): VITAMINB12, FOLATE, FERRITIN, TIBC, IRON, RETICCTPCT in the last 72 hours. Sepsis Labs: No results for input(s): PROCALCITON, LATICACIDVEN in the last 168 hours.  Recent Results (from the past 240 hour(s))  Urine Culture     Status: Abnormal   Collection Time: 11/13/20  8:24 AM   Specimen: Urine, Clean Catch  Result Value Ref Range Status   Specimen Description URINE, CLEAN CATCH  Final   Special Requests NONE  Final   Culture (A)  Final    <10,000 COLONIES/mL INSIGNIFICANT GROWTH Performed at [facility], [address], [place], [place] [address]    Report Status 11/14/2020 FINAL  Final  MRSA Next Gen by PCR, Nasal     Status: None   Collection Time: 11/13/20  8:25 AM   Specimen: Nasal Mucosa; Nasal Swab  Result Value Ref Range Status   MRSA by PCR Next Gen NOT DETECTED NOT DETECTED Final    Comment: (NOTE) The GeneXpert MRSA Assay (FDA approved for NASAL specimens only), is one component of a comprehensive MRSA colonization surveillance program. It is not intended to diagnose MRSA infection nor to guide or monitor treatment for MRSA infections. Test performance is not FDA approved in patients less than 18 years old. Performed at [facility], [place] [address], [place], [place] [address]   Culture, blood (routine x  2)     Status: None (Preliminary result)   Collection Time: 11/13/20  9:02 AM   Specimen: BLOOD  Result Value Ref Range Status   Specimen Description BLOOD RIGHT ANTECUBITAL  Final   Special Requests   Final    BOTTLES DRAWN AEROBIC ONLY Blood Culture adequate volume   Culture   Final    NO GROWTH 4 DAYS Performed at [facility], [place] [address], [place], [place] [address]    Report Status PENDING  Incomplete  Culture, blood (routine x 2)  Status: None (Preliminary result)   Collection Time: 11/13/20  9:08 AM   Specimen: BLOOD  Result Value Ref Range Status   Specimen Description BLOOD LEFT ANTECUBITAL  Final   Special Requests   Final    BOTTLES DRAWN AEROBIC AND ANAEROBIC Blood Culture adequate volume   Culture   Final    NO GROWTH 4 DAYS Performed at [facility], [address], [place], [place] [address]    Report Status PENDING  Incomplete  Resp Panel by RT-PCR (Flu A&B, Covid) Nasopharyngeal Swab     Status: None   Collection Time: 11/13/20  5:07 PM   Specimen: Nasopharyngeal Swab; Nasopharyngeal(NP) swabs in vial transport medium  Result Value Ref Range Status   SARS Coronavirus 2 by RT PCR NEGATIVE NEGATIVE Final    Comment: (NOTE) SARS-CoV-2 target nucleic acids are NOT DETECTED.  The SARS-CoV-2 RNA is generally detectable in upper respiratory specimens during the acute phase of infection. The lowest concentration of SARS-CoV-2 viral copies this assay can detect is 138 copies/mL. A negative result does not preclude SARS-Cov-2 infection and should not be used as the sole basis for treatment or other patient management decisions. A negative result may occur with  improper specimen collection/handling, submission of specimen other than nasopharyngeal swab, presence of viral mutation(s) within the areas targeted by this assay, and inadequate number of viral copies(<138 copies/mL). A negative result must be combined with clinical observations, patient  history, and epidemiological information. The expected result is Negative.  Fact Sheet for Patients:  EntrepreneurPulse.com.au  Fact Sheet for Healthcare Providers:  IncredibleEmployment.be  This test is no t yet approved or cleared by the [place] FDA and  has been authorized for detection and/or diagnosis of SARS-CoV-2 by FDA under an Emergency Use Authorization (EUA). This EUA will remain  in effect (meaning this test can be used) for the duration of the COVID-19 declaration under Section 564(b)(1) of the Act, 21 U.S.C.section 360bbb-3(b)(1), unless the authorization is terminated  or revoked sooner.       Influenza A by PCR NEGATIVE NEGATIVE Final   Influenza B by PCR NEGATIVE NEGATIVE Final    Comment: (NOTE) The Xpert Xpress SARS-CoV-2/FLU/RSV plus assay is intended as an aid in the diagnosis of influenza from Nasopharyngeal swab specimens and should not be used as a sole basis for treatment. Nasal washings and aspirates are unacceptable for Xpert Xpress SARS-CoV-2/FLU/RSV testing.  Fact Sheet for Patients: EntrepreneurPulse.com.au  Fact Sheet for Healthcare Providers: IncredibleEmployment.be  This test is not yet approved or cleared by the [place] FDA and has been authorized for detection and/or diagnosis of SARS-CoV-2 by FDA under an Emergency Use Authorization (EUA). This EUA will remain in effect (meaning this test can be used) for the duration of the COVID-19 declaration under Section 564(b)(1) of the Act, 21 U.S.C. section 360bbb-3(b)(1), unless the authorization is terminated or revoked.  Performed at [facility], [place] [address], [place], [place] [address]     Radiology Studies: CT HEAD WO CONTRAST (5MM)  Result Date: 11/16/2020 CLINICAL DATA:  Mental status change, unknown cause EXAM: CT HEAD WITHOUT CONTRAST TECHNIQUE: Contiguous axial images were obtained from the  base of the skull through the vertex without intravenous contrast. COMPARISON:  11/05/2020. FINDINGS: Mild to moderately motion limited study. Brain: Similar evolving right posterior MCA territory infarct with associated edema and loss of gray-white differentiation. Similar scattered curvilinear hyperdensity within the infarct, compatible with petechial hemorrhage. Similar leftward midline shift with approximately 6  mm at the foramen of Monro. Similar effacement of the right lateral ventricle. No progressive ventriculomegaly. No evidence of new/interval acute large vascular territorial infarct. No evidence of progressive hemorrhage. No visible extra-axial fluid collection. Basal cisterns are patent. Vascular: Calcific intracranial atherosclerosis. Skull: No acute fracture. Sinuses/Orbits: Mild paranasal sinus mucosal thickening. No acute orbital findings. Other: No mastoid effusions. IMPRESSION: Similar large evolving right posterior MCA territory infarct with similar 6 mm leftward midline shift and petechial hemorrhage. Electronically Signed   By: Margaretha Sheffield M.D.   On: 11/16/2020 12:43    Scheduled Meds:  amLODipine  10 mg Per Tube Daily   apixaban  5 mg Per Tube BID   atorvastatin  40 mg Per Tube Daily   erythromycin   Right Eye TID   feeding supplement (PROSource TF)  45 mL Per Tube BID   free water  200 mL Per Tube Q4H   hydrALAZINE  25 mg Per Tube QID   insulin aspart  0-9 Units Subcutaneous Q4H   levETIRAcetam  250 mg Per Tube BID   mouth rinse  15 mL Mouth Rinse BID   metoprolol tartrate  12.5 mg Per Tube BID   modafinil  100 mg Per Tube Daily   nystatin  5 mL Mouth/Throat QID   pantoprazole sodium  40 mg Per Tube Daily   saccharomyces boulardii  250 mg Per Tube BID   topiramate  50 mg Per Tube BID   Continuous Infusions:  ceFEPime (MAXIPIME) IV 2 g (11/17/20 1243)   feeding supplement (JEVITY 1.5 CAL/FIBER) 1,000 mL (11/15/20 1723)     LOS: 34 days    Time spent: 35  mins    Lavanya Roa, MD [facility]   If 7PM-7AM, please contact night-coverage

## 2020-11-18 DIAGNOSIS — R531 Weakness: Secondary | ICD-10-CM | POA: Diagnosis not present

## 2020-11-18 LAB — CBC
HCT: 35.5 % — ABNORMAL LOW (ref 39.0–52.0)
Hemoglobin: 11.1 g/dL — ABNORMAL LOW (ref 13.0–17.0)
MCH: 25.9 pg — ABNORMAL LOW (ref 26.0–34.0)
MCHC: 31.3 g/dL (ref 30.0–36.0)
MCV: 82.8 fL (ref 80.0–100.0)
Platelets: 103 10*3/uL — ABNORMAL LOW (ref 150–400)
RBC: 4.29 MIL/uL (ref 4.22–5.81)
RDW: 15.1 % (ref 11.5–15.5)
WBC: 6.7 10*3/uL (ref 4.0–10.5)
nRBC: 0 % (ref 0.0–0.2)

## 2020-11-18 LAB — CULTURE, BLOOD (ROUTINE X 2)
Culture: NO GROWTH
Culture: NO GROWTH
Special Requests: ADEQUATE
Special Requests: ADEQUATE

## 2020-11-18 LAB — BASIC METABOLIC PANEL
Anion gap: 9 (ref 5–15)
BUN: 36 mg/dL — ABNORMAL HIGH (ref 8–23)
CO2: 20 mmol/L — ABNORMAL LOW (ref 22–32)
Calcium: 8.6 mg/dL — ABNORMAL LOW (ref 8.9–10.3)
Chloride: 113 mmol/L — ABNORMAL HIGH (ref 98–111)
Creatinine, Ser: 1.39 mg/dL — ABNORMAL HIGH (ref 0.61–1.24)
GFR, Estimated: 54 mL/min — ABNORMAL LOW (ref >60)
Glucose, Bld: 121 mg/dL — ABNORMAL HIGH (ref 70–99)
Potassium: 4.1 mmol/L (ref 3.5–5.1)
Sodium: 142 mmol/L (ref 135–145)

## 2020-11-18 LAB — GLUCOSE, CAPILLARY
Glucose-Capillary: 106 mg/dL — ABNORMAL HIGH (ref 70–99)
Glucose-Capillary: 109 mg/dL — ABNORMAL HIGH (ref 70–99)
Glucose-Capillary: 102 mg/dL — ABNORMAL HIGH (ref 70–99)
Glucose-Capillary: 116 mg/dL — ABNORMAL HIGH (ref 70–99)
Glucose-Capillary: 137 mg/dL — ABNORMAL HIGH (ref 70–99)
Glucose-Capillary: 95 mg/dL (ref 70–99)
Glucose-Capillary: 106 mg/dL — ABNORMAL HIGH (ref 70–99)

## 2020-11-18 LAB — MAGNESIUM: Magnesium: 2 mg/dL (ref 1.7–2.4)

## 2020-11-18 LAB — PHOSPHORUS: Phosphorus: 3.5 mg/dL (ref 2.5–4.6)

## 2020-11-18 MED ORDER — PANTOPRAZOLE 2 MG/ML SUSPENSION: 40.0000 mg | Freq: Every day | ORAL | Status: DC

## 2020-11-18 MED ORDER — PANTOPRAZOLE 2 MG/ML SUSPENSION
40.0000 mg | Freq: Every day | ORAL | Status: DC
  Administered 2020-11-19 – 2020-12-06 (×18): 40 mg
  Filled 2020-11-18 (×15): qty 20

## 2020-11-18 MED ORDER — INFLUENZA VAC A&B SA ADJ QUAD 0.5 ML IM PRSY
0.5000 mL | PREFILLED_SYRINGE | INTRAMUSCULAR | Status: AC
  Administered 2020-11-19: 0.5 mL via INTRAMUSCULAR
  Filled 2020-11-18: qty 0.5

## 2020-11-18 MED ORDER — PANTOPRAZOLE SODIUM 40 MG PO PACK: 40.0000 mg | PACK | Freq: Every day | ORAL | Status: DC

## 2020-11-18 NOTE — Progress Notes (Signed)
Occupational Therapy Treatment Patient Details Name: Clifford [name] MRN: [id number] DOB: 1950-08-12 Today's Date: 11/18/2020   History of present illness 70 yo male presented to ED on 8/11 for R sided headache and worsening L UE weakness. Patient recently admitted 8/3-8/5 for R CVA with L visual deficits and L sided weakness. MRI with mild enlargement of R MCA/border zone infarct since 10/07/20 MRI and increased cytotoxic edema and petechial hemorrhage without midline shift. Palliative Care consulted and in detailed family discussions, with prognosis provided by Neurology (more likely able to regain ability to talk and participate in self cares, unlikely to regain ability to walk, uncertain if able to regain ability to swallow, very unlikely to improve to full independence), it was decided to place PEG and pursue SNF rehab, PEG was placed 8/30. 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   OT comments  Pt received in bed, agreeable to OT/PT session. Pt required heavy maxA to progress to EOB. He required minA-maxA for stability sitting EOB while engaging in grooming and applying lotion to BLE. Pt required maxA to cross midline to wash left side of face, wash LUE and apply lotion to LUE/LLE. He required totalA to use LUE to apply lotion to RLE. He required maxA-totalA for sit<>stand and stand pivot transfer to recliner. Pt will continue to benefit from skilled OT services to maximize safety and independence with ADL/IADL and functional mobility. Will continue to follow acutely and progress as tolerated.     Recommendations for follow up therapy are one component of a multi-disciplinary discharge planning process, led by the attending physician.  Recommendations may be updated based on patient status, additional functional criteria and insurance authorization.    Follow Up Recommendations  SNF    Equipment Recommendations  None recommended by OT    Recommendations for Other Services PT consult     Precautions / Restrictions Precautions Precautions: Fall Precaution Comments: L visual field deficit, R gaze preference, L inattention, L hemparesis; G-tube and abdominal binder; penile skin breakdown from long term condom cath use/moisture Required Braces or Orthoses:  (abdominal binder) Restrictions Weight Bearing Restrictions: No       Mobility Bed Mobility Overal bed mobility: Needs Assistance Bed Mobility: Rolling Rolling: Max assist;+2 for physical assistance;+2 for safety/equipment Sidelying to sit: Max assist;HOB elevated;+2 for physical assistance;+2 for safety/equipment       General bed mobility comments: heavy maxA for rolling R<>L in bed to change pad, assisting pt with locating bed rails;Pt required heavy maxA+2 for sidelying to sit,    Transfers Overall transfer level: Needs assistance Equipment used: Ambulation equipment used;2 person hand held assist Transfers: Sit to/from [employer] Transfers Sit to Stand: Total assist;Max assist;+2 physical assistance;+2 safety/equipment Stand pivot transfers: +2 physical assistance;Total assist       General transfer comment: pt transferred from EOB to recliner with face to face transfer    Balance Overall balance assessment: Needs assistance Sitting-balance support: Bilateral upper extremity supported;Feet supported Sitting balance-Leahy Scale: Poor Sitting balance - Comments: pt requires minA-maxA for stability sitting EOB;maxA-totalA for postural corrections with trunk flexion/trunk extension and R<>L lateral changes in position to challenge righting reaction Postural control: Posterior lean;Left lateral lean Standing balance support: Bilateral upper extremity supported Standing balance-Leahy Scale: Zero Standing balance comment: pt stood with face to face assistance x2 with maximum assistance  ADL either performed or assessed with clinical judgement   ADL Overall ADL's :  Needs assistance/impaired     Grooming: Moderate assistance;Wash/dry Nurse, mental health Details (indicate cue type and reason): modA to wash left side of face, hand over hand assistance to guide RUE Upper Body Bathing: Moderate assistance;Sitting Upper Body Bathing Details (indicate cue type and reason): assistance to initiate washing LUE with cold washcloth, pt continued independently for about 30seconds Lower Body Bathing: Maximal assistance;Sitting/lateral leans Lower Body Bathing Details (indicate cue type and reason): maxA to rub lotion on BLE, totalA to apply lotion to RLE with LUE;     Lower Body Dressing: Total assistance;Bed level Lower Body Dressing Details (indicate cue type and reason): totalA to don socks Toilet Transfer: Total assistance;+2 for physical assistance;+2 for safety/equipment Toilet Transfer Details (indicate cue type and reason): totalA for stand-pivot transfer from EOB to recliner;pt incontinent of bowels, required assistance for posterior care Toileting- Clothing Manipulation and Hygiene: Total assistance;+2 for physical assistance;+2 for safety/equipment;Bed level Toileting - Clothing Manipulation Details (indicate cue type and reason): totalA for posterior/pericare     Functional mobility during ADLs: Cueing for safety;Cueing for sequencing;Maximal assistance;+2 for physical assistance;+2 for safety/equipment General ADL Comments: assisted pt with use of BUE to cross midline during grooming and application of lotion. Pt perseverating with washing mouth and applying lotion to therapists hand. Left side neglect     Vision   Vision Assessment?: Yes Alignment/Gaze Preference: Head turned;Gaze right Visual Fields: Left homonymous hemianopsia Additional Comments: able to progress to head and gaze in midline with maximum assistance and cues, with progression of head turn to the left, pt with immediate and strong gaze to right, unable to bring eyes to midline.    Perception     Praxis      Cognition Arousal/Alertness: Awake/alert Behavior During Therapy: WFL for tasks assessed/performed Overall Cognitive Status: Difficult to assess Area of Impairment: Attention;Safety/judgement;Awareness;Problem solving;Following commands                   Current Attention Level: Sustained Memory: Decreased recall of precautions;Decreased short-term memory Following Commands: Follows one step commands inconsistently;Follows one step commands with increased time Safety/Judgement: Decreased awareness of deficits;Decreased awareness of safety Awareness: Intellectual Problem Solving: Slow processing;Difficulty sequencing;Requires verbal cues;Requires tactile cues;Decreased initiation General Comments: Pt awake throughout session, pt following one step commands about 25% of the time with multimodal cues;Pt with Lsided neglect, attempting to put lotion on therapist's hand instead of his own. Pt verbalizing more this session, typically 1-2 word answers but pt did ask "who are you " to therapist. Pt with increased level of arousal this session tolerating sitting EOB about 38mn. Pt with poor righting reaction sitting EOB.        Exercises     Shoulder Instructions       General Comments VSS on RA    Pertinent Vitals/ Pain       Pain Assessment: Faces Faces Pain Scale: Hurts little more Pain Location: generalized, peri-area with clean up due to raw areas and skin breakdown on penis (tip and posterior surface near base) Pain Descriptors / Indicators: Grimacing;Guarding Pain Intervention(s): Monitored during session;Limited activity within patient's tolerance  Home Living                                          Prior Functioning/Environment  Frequency  Min 2X/week        Progress Toward Goals  OT Goals(current goals can now be found in the care plan section)  Progress towards OT goals: Progressing toward  goals  Acute Rehab OT Goals Patient Stated Goal: None stated OT Goal Formulation: Patient unable to participate in goal setting Time For Goal Achievement: 11/25/20 Potential to Achieve Goals: Fair ADL Goals Pt Will Perform Grooming: with mod assist;sitting;bed level Pt Will Perform Lower Body Bathing: with mod assist;bed level Pt Will Perform Lower Body Dressing: with mod assist;sitting/lateral leans Pt Will Transfer to Toilet: with mod assist Pt Will Perform Toileting - Clothing Manipulation and hygiene: with mod assist;bed level Additional ADL Goal #1: Pt will locate three grooming items in left visual field with Min cues Additional ADL Goal #2: Pt will demonstrate emergent awareness during ADLs with Min cues  Plan Discharge plan remains appropriate    Co-evaluation    PT/OT/SLP Co-Evaluation/Treatment: Yes Reason for Co-Treatment: Complexity of the patient's impairments (multi-system involvement);For patient/therapist safety;To address functional/ADL transfers   OT goals addressed during session: ADL's and self-care      AM-PAC OT "6 Clicks" Daily Activity     Outcome Measure   Help from another person eating meals?: Total Help from another person taking care of personal grooming?: A Lot Help from another person toileting, which includes using toliet, bedpan, or urinal?: Total Help from another person bathing (including washing, rinsing, drying)?: Total Help from another person to put on and taking off regular upper body clothing?: A Lot Help from another person to put on and taking off regular lower body clothing?: Total 6 Click Score: 8    End of Session    OT Visit Diagnosis: Unsteadiness on feet (R26.81);Muscle weakness (generalized) (M62.81);Other symptoms and signs involving cognitive function   Activity Tolerance Patient tolerated treatment well   Patient Left with call Naf[place]/phone within reach;in chair;with chair alarm set;with restraints reapplied   Nurse  Communication Mobility status        Time: [id number] OT Time Calculation (min): 40 min  Charges: OT General Charges $OT Visit: 1 Visit OT Treatments $Self Care/Home Management : 8-22 mins  Helene Kelp OTR/L Acute Rehabilitation Services Office: [place] 11/18/2020, 1:38 PM

## 2020-11-18 NOTE — Progress Notes (Signed)
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  He had an initial stroke on 10/06/20, was admitted for 2 days then discharged with home health.  After about 4 days at home, he developed worsening of his stroke symptoms, returned to the hospital.  In the ER, He is found to have enlargement of his previous stroke with microhemorrhage.  Initially had relatively mild deficits , NIHSS 4, so his Pradaxa was held and he was admitted for SNF placement for short-term rehab.  However, he developed progressively worsening left hemiparesis, then somnolence.  Neurology were reconsulted.  Serial CT head on 8/16 showed expected evolution/progression of his stroke, further edema but no herniation or  substantial midline shift. MRI ; 8/25: increasing midline shift.  Neurology following the patient and recommended further supportive care and SNF placement.  NG tube was placed as patient lost ability to swallow.  Palliative care were consulted and in detail family discussion, with prognosis provided by neurology (more likely able to regain ability to talk and participate in self-care, unlikely to regain ability to walk, uncertain if able to regain ability to swallow, very unlikely to improve to full independence), it was decided to place PEG and pursue a SNF for rehab.   Awaiting skilled nursing facility.  Assessment & Plan:   Principal Problem:   Left-sided weakness Active Problems:   Stroke [facility])   Essential hypertension   Mixed hyperlipidemia   Prediabetes   History of pulmonary embolism   Chronic anticoagulation   Nicotine dependence, cigarettes, uncomplicated   Petechial hemorrhage    Acute stroke due to ischemia [facility])   Advanced care planning/counseling discussion   Goals of care, counseling/discussion   Palliative care by specialist  Progression of previous right MCA/PCA territory infarct with mild edema now with midline shift on repeat MRI 10/28/20. MRI showed mild enlargement with cytotoxic edema and petechial hemorrhage with increased midline shift compared to 10/07/2020. Neurology suspect his current poor functional status is mostly due to the swelling, with a component of this from free water and a component of this from bleeding. He remains with significant dysphagia, PEG tube placed by IR 8/30 Patient was started on Topamax for headache. Repeat CT head 8/30 showing progressive CVA, midline shift is 10 mm as opposed to 14 mm on MRI 8/25. Due to sinus tachycardia of unclear cause, CT head was repeated on 9/2 which showed a stable CVA and midline shift is now 6 mm. Awaiting skilled nursing facility. Patient has waxing and waning in his mental status.  He is alert , he was able to follow commands. Repeated CT head 9/13; stable.    Fever : > Resolved. Patient spiked fever of 102.4 on 9/10. He was pancultured, started  IV antibiotics on 9/10 vanc and cefepime. Vancomycin DC 9/11. Chest x ray  No acute diseases, low lung volume..  UA; Negative, Urine culture: 10,000 colonies.  Blood culture: No growth To date.  Covid test negative 9/10. MRSA PCR negative.  Plan to treat for 5 days of cefepime. Day 5/5.   Hypertension Continue amlodipine and hydralazine. Continue Metoprolol for tachycardia.   Suspected  focal seizure: Presented with left hand twitching which was nonsuppressible and clinically suspicious  for stroke and was started on Keppra. Keppra dose reduced on  8/24 to eliminate sedating side effect from Keppra.  Provigil  9/3.    Cough; Chest x-ray unremarkable. Continue Flutter valve.   Hypernatremia: > Resolved. Due to decreased free water, monitor  overall stable. Discussed with Dr Erlinda Hong, ok to start correcting sodium level with free water.  Improved. IV fluids discontinued.  Acute kidney injury on CKD stage III A: Baseline serum creatinine 1.2-1.4 Improved,  back to baseline.   History of VTE :  continue Eliquis.   Dysphagia :  Continue PEG tube.   Lung nodule: Incidental finding of 8 mm left upper lobe nodule.   Need to follow-up CT 6 to 12 months depending on neurological progression   Possible sclerotic vertebra on imaging: Incidental finding.  PSA was 5.  Follow-up as an outpatient.   Chronic diastolic heart failure: Appears euvolemic.  Hyperkalemia; Resolved with Lokelma.  Right eye pain: case was discussed with ophthalmology Dr. Posey Pronto, states could be related to abrasion from a stroke. Continue ointment.  Thrombocytopenia; stable. Monitor.   GI, Loose Stool;  > Resolved. Nutritionist consulted, to see if formula can be changed.  Started  florastore.  Stool is not watery to required rectal tube.    Nutrition Problem: Inadequate oral intake Etiology: dysphagia    DVT prophylaxis: Eliquis Code Status: DNR Family Communication: No family at bed side. Disposition Plan:  Status is: Inpatient  Remains inpatient appropriate because:Inpatient level of care appropriate due to severity of illness  Dispo: The patient is from: Home              Anticipated d/c is to: SNF              Patient currently is not medically stable to d/c.   Difficult to place patient No  Consultants:  Palliative  Neurology  Procedures: PEG tube placement Antimicrobials: None  Subjective: Patient was seen and examined at bedside.  Overnight events noted.   Patient was sitting comfortably on the chair,  alert and oriented x 2. He is able to lift right arm.  Objective: Vitals:   11/18/20 0345 11/18/20 0549 11/18/20 0737 11/18/20 1152  BP: (!) 155/80  (!) 149/77 (!) 147/78  Pulse: 69  83 74  Resp: '20  17 18  '$ Temp: 99 F (37.2 C)   98.3 F (36.8 C) 98 F (36.7 C)  TempSrc: Axillary  Oral Oral  SpO2: 100%  100% 100%  Weight:  86 kg    Height:        Intake/Output Summary (Last 24 hours) at 11/18/2020 1349 Last data filed at 11/18/2020 0600 Gross per 24 hour  Intake 2170 ml  Output 925 ml  Net 1245 ml   Filed Weights   11/15/20 0512 11/16/20 0500 11/18/20 0549  Weight: 86.2 kg 85.4 kg 86 kg    Examination:  General exam: Appears comfortable, not in any acute distress. Respiratory system: Clear to auscultation bilaterally, no wheezing. Cardiovascular system: S1-S2 heard, regular rate and rhythm, no murmur. Gastrointestinal system: Abdomen is soft, nontender, nondistended, BS+. Central nervous system: Alert and oriented x 2.  Left-sided weakness noted. Extremities: No edema, no cyanosis, no clubbing. Skin: No rashes, lesions or ulcers Psychiatry: Judgement and insight appear normal. Mood & affect appropriate.     Data Reviewed: I have personally reviewed following labs and imaging studies  CBC: Recent Labs  Lab 11/12/20 0304 11/13/20 0902 11/15/20 0353 11/16/20 1017 11/18/20 0339  WBC 7.6 5.5  7.4 6.4 6.7  HGB 12.4* 10.7* 11.2* 11.1* 11.1*  HCT 40.4 35.8* 35.6* 34.8* 35.5*  MCV 85.4 86.5 83.8 81.9 82.8  PLT 92* 91* 93* 91* [id number]*   Basic Metabolic Panel: Recent Labs  Lab 11/13/20 0902 11/14/20 0222 11/15/20 0353 11/16/20 1017 11/18/20 0339  NA 148* 147* 143 142 142  K 3.7 3.9 4.6 3.5 4.1  CL 122* 123* 118* 115* 113*  CO2 19* 18* 20* 19* 20*  GLUCOSE 142* 124* 103* 136* 121*  BUN 52* 47* 38* 32* 36*  CREATININE 1.70* 1.46* 1.46* 1.28* 1.39*  CALCIUM 8.0* 8.2* 8.3* 8.4* 8.6*  MG  --   --   --   --  2.0  PHOS  --   --   --   --  3.5   GFR: Estimated Creatinine Clearance: 48.4 mL/min (A) (by C-G formula based on SCr of 1.39 mg/dL (H)). Liver Function Tests: Recent Labs  Lab 11/12/20 0304  AST 97*  ALT 134*  ALKPHOS 69  BILITOT 0.6  PROT 6.3*  ALBUMIN 2.5*   No results for  input(s): LIPASE, AMYLASE in the last 168 hours. No results for input(s): AMMONIA in the last 168 hours. Coagulation Profile: No results for input(s): INR, PROTIME in the last 168 hours. Cardiac Enzymes: No results for input(s): CKTOTAL, CKMB, CKMBINDEX, TROPONINI in the last 168 hours. BNP (last 3 results) No results for input(s): PROBNP in the last 8760 hours. HbA1C: No results for input(s): HGBA1C in the last 72 hours. CBG: Recent Labs  Lab 11/17/20 1931 11/18/20 0103 11/18/20 0343 11/18/20 0740 11/18/20 1141  GLUCAP 106* 106* 109* 102* 116*   Lipid Profile: No results for input(s): CHOL, HDL, LDLCALC, TRIG, CHOLHDL, LDLDIRECT in the last 72 hours. Thyroid Function Tests: No results for input(s): TSH, T4TOTAL, FREET4, T3FREE, THYROIDAB in the last 72 hours. Anemia Panel: No results for input(s): VITAMINB12, FOLATE, FERRITIN, TIBC, IRON, RETICCTPCT in the last 72 hours. Sepsis Labs: No results for input(s): PROCALCITON, LATICACIDVEN in the last 168 hours.  Recent Results (from the past 240 hour(s))  Urine Culture     Status: Abnormal   Collection Time: 11/13/20  8:24 AM   Specimen: Urine, Clean Catch  Result Value Ref Range Status   Specimen Description URINE, CLEAN CATCH  Final   Special Requests NONE  Final   Culture (A)  Final    <10,000 COLONIES/mL INSIGNIFICANT GROWTH Performed at [facility], [address] [place], [place] [address]    Report Status 11/14/2020 FINAL  Final  MRSA Next Gen by PCR, Nasal     Status: None   Collection Time: 11/13/20  8:25 AM   Specimen: Nasal Mucosa; Nasal Swab  Result Value Ref Range Status   MRSA by PCR Next Gen NOT DETECTED NOT DETECTED Final    Comment: (NOTE) The GeneXpert MRSA Assay (FDA approved for NASAL specimens only), is one component of a comprehensive MRSA colonization surveillance program. It is not intended to diagnose MRSA infection nor to guide or monitor treatment for MRSA infections. Test performance  is not FDA approved in patients less than 70 years old. Performed at [facility], [place] [address], [place], [place] [address]   Culture, blood (routine x 2)     Status: None   Collection Time: 11/13/20  9:02 AM   Specimen: BLOOD  Result Value Ref Range Status   Specimen Description BLOOD RIGHT ANTECUBITAL  Final   Special Requests   Final  BOTTLES DRAWN AEROBIC ONLY Blood Culture adequate volume   Culture   Final    NO GROWTH 5 DAYS Performed at [facility], [place] [address] [place], [place] [address]    Report Status 11/18/2020 FINAL  Final  Culture, blood (routine x 2)     Status: None   Collection Time: 11/13/20  9:08 AM   Specimen: BLOOD  Result Value Ref Range Status   Specimen Description BLOOD LEFT ANTECUBITAL  Final   Special Requests   Final    BOTTLES DRAWN AEROBIC AND ANAEROBIC Blood Culture adequate volume   Culture   Final    NO GROWTH 5 DAYS Performed at [facility], [place] [address] [place], [place] [address]    Report Status 11/18/2020 FINAL  Final  Resp Panel by RT-PCR (Flu A&B, Covid) Nasopharyngeal Swab     Status: None   Collection Time: 11/13/20  5:07 PM   Specimen: Nasopharyngeal Swab; Nasopharyngeal(NP) swabs in vial transport medium  Result Value Ref Range Status   SARS Coronavirus 2 by RT PCR NEGATIVE NEGATIVE Final    Comment: (NOTE) SARS-CoV-2 target nucleic acids are NOT DETECTED.  The SARS-CoV-2 RNA is generally detectable in upper respiratory specimens during the acute phase of infection. The lowest concentration of SARS-CoV-2 viral copies this assay can detect is 138 copies/mL. A negative result does not preclude SARS-Cov-2 infection and should not be used as the sole basis for treatment or other patient management decisions. A negative result may occur with  improper specimen collection/handling, submission of specimen other than nasopharyngeal swab, presence of viral mutation(s) within the areas targeted by this assay,  and inadequate number of viral copies(<138 copies/mL). A negative result must be combined with clinical observations, patient history, and epidemiological information. The expected result is Negative.  Fact Sheet for Patients:  EntrepreneurPulse.com.au  Fact Sheet for Healthcare Providers:  IncredibleEmployment.be  This test is no t yet approved or cleared by the [place] FDA and  has been authorized for detection and/or diagnosis of SARS-CoV-2 by FDA under an Emergency Use Authorization (EUA). This EUA will remain  in effect (meaning this test can be used) for the duration of the COVID-19 declaration under Section 564(b)(1) of the Act, 21 U.S.C.section 360bbb-3(b)(1), unless the authorization is terminated  or revoked sooner.       Influenza A by PCR NEGATIVE NEGATIVE Final   Influenza B by PCR NEGATIVE NEGATIVE Final    Comment: (NOTE) The Xpert Xpress SARS-CoV-2/FLU/RSV plus assay is intended as an aid in the diagnosis of influenza from Nasopharyngeal swab specimens and should not be used as a sole basis for treatment. Nasal washings and aspirates are unacceptable for Xpert Xpress SARS-CoV-2/FLU/RSV testing.  Fact Sheet for Patients: EntrepreneurPulse.com.au  Fact Sheet for Healthcare Providers: IncredibleEmployment.be  This test is not yet approved or cleared by the [place] FDA and has been authorized for detection and/or diagnosis of SARS-CoV-2 by FDA under an Emergency Use Authorization (EUA). This EUA will remain in effect (meaning this test can be used) for the duration of the COVID-19 declaration under Section 564(b)(1) of the Act, 21 U.S.C. section 360bbb-3(b)(1), unless the authorization is terminated or revoked.  Performed at [facility], [place] [address] [place], [place] [address]     Radiology Studies: No results found.  Scheduled Meds:  amLODipine  10 mg Per Tube  Daily   apixaban  5 mg Per Tube BID   atorvastatin  40 mg Per Tube Daily  erythromycin   Right Eye TID   feeding supplement (PROSource TF)  45 mL Per Tube BID   free water  200 mL Per Tube Q4H   hydrALAZINE  25 mg Per Tube QID   [START ON 11/19/2020] influenza vaccine adjuvanted  0.5 mL Intramuscular Tomorrow-1000   insulin aspart  0-9 Units Subcutaneous Q4H   levETIRAcetam  250 mg Per Tube BID   mouth rinse  15 mL Mouth Rinse BID   metoprolol tartrate  12.5 mg Per Tube BID   modafinil  100 mg Per Tube Daily   nystatin  5 mL Mouth/Throat QID   pantoprazole sodium  40 mg Per Tube Daily   saccharomyces boulardii  250 mg Per Tube BID   topiramate  50 mg Per Tube BID   Continuous Infusions:  feeding supplement (JEVITY 1.5 CAL/FIBER) 1,000 mL (11/15/20 1723)     LOS: 35 days    Time spent: 25 mins    Alvie Speltz, MD [facility]   If 7PM-7AM, please contact night-coverage

## 2020-11-18 NOTE — Plan of Care (Signed)

## 2020-11-18 NOTE — Progress Notes (Signed)
Physical Therapy Treatment Patient Details Name: Clifford [name] MRN: [id number] DOB: 1951-02-28 Today's Date: 11/18/2020   History of Present Illness 70 yo male presented to ED on 8/11 for R sided headache and worsening L UE weakness. Patient recently admitted 8/3-8/5 for R CVA with L visual deficits and L sided weakness. MRI with mild enlargement of R MCA/border zone infarct since 10/07/20 MRI and increased cytotoxic edema and petechial hemorrhage without midline shift. Palliative Care consulted and in detailed family discussions, with prognosis provided by Neurology (more likely able to regain ability to talk and participate in self cares, unlikely to regain ability to walk, uncertain if able to regain ability to swallow, very unlikely to improve to full independence), it was decided to place PEG and pursue SNF rehab, PEG was placed 8/30. 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    PT Comments    Pt was more alert, based on past notes, pt has not made much progress on function.  Pt has severe gaze preference, little to no awareness of R side, perseverates on task, has little to no movement on the L side, significant L UE resistance to movement.  Emphasis today on transitions, approx 10 min sitting EOB, working on truncal activation /balance, sit to stand for pericare and transfer with face to face assist to the chair.    Recommendations for follow up therapy are one component of a multi-disciplinary discharge planning process, led by the attending physician.  Recommendations may be updated based on patient status, additional functional criteria and insurance authorization.  Follow Up Recommendations  SNF;Supervision/Assistance - 24 hour     Equipment Recommendations  Other (comment) (TBD)    Recommendations for Other Services Speech consult     Precautions / Restrictions Precautions Precautions: Fall Precaution Comments: L visual field deficit, R gaze preference, L inattention, L  hemparesis; G-tube and abdominal binder; penile skin breakdown from long term condom cath use/moisture Required Braces or Orthoses:  (abdominal binder) Restrictions Weight Bearing Restrictions: No     Mobility  Bed Mobility Overal bed mobility: Needs Assistance Bed Mobility: Rolling Rolling: Max assist;+2 for physical assistance;+2 for safety/equipment Sidelying to sit: Max assist;HOB elevated;+2 for physical assistance;+2 for safety/equipment       General bed mobility comments: heavy maxA for rolling R<>L in bed to change pad, assisting pt with locating bed rails;Pt required heavy maxA+2 for sidelying to sit,    Transfers Overall transfer level: Needs assistance Equipment used: Ambulation equipment used;2 person hand held assist Transfers: Sit to/from [employer] Transfers Sit to Stand: Total assist;Max assist;+2 physical assistance;+2 safety/equipment Stand pivot transfers: +2 physical assistance;Total assist       General transfer comment: pt transferred from EOB to recliner with face to face transfer  Ambulation/Gait                 Stairs             Wheelchair Mobility    Modified Rankin (Stroke Patients Only)       Balance Overall balance assessment: Needs assistance Sitting-balance support: Bilateral upper extremity supported;Feet supported Sitting balance-Leahy Scale: Poor Sitting balance - Comments: pt requires minA-maxA for stability sitting EOB;maxA-totalA for postural corrections with trunk flexion/trunk extension and R<>L lateral changes in position to challenge righting reaction Postural control: Posterior lean;Left lateral lean Standing balance support: Bilateral upper extremity supported Standing balance-Leahy Scale: Zero Standing balance comment: pt stood with face to face assistance x2 with maximum assistance  Cognition Arousal/Alertness: Awake/alert Behavior During Therapy: WFL for tasks  assessed/performed Overall Cognitive Status: Difficult to assess Area of Impairment: Attention;Safety/judgement;Awareness;Problem solving;Following commands                   Current Attention Level: Sustained Memory: Decreased recall of precautions;Decreased short-term memory Following Commands: Follows one step commands inconsistently;Follows one step commands with increased time Safety/Judgement: Decreased awareness of deficits;Decreased awareness of safety Awareness: Intellectual Problem Solving: Slow processing;Difficulty sequencing;Requires verbal cues;Requires tactile cues;Decreased initiation General Comments: Pt awake throughout session, pt following one step commands about 25% of the time with multimodal cues;Pt with Lsided neglect, attempting to put lotion on therapist's hand instead of his own. Pt verbalizing more this session, typically 1-2 word answers but pt did ask "who are you " to therapist. Pt with increased level of arousal this session tolerating sitting EOB about 69mn. Pt with poor righting reaction sitting EOB.      Exercises Other Exercises Other Exercises: hip/knee flexion/ext ROM with graded assist/resistance, L side AA/P Other Exercises: L UE PROM/stretch of flexors    General Comments General comments (skin integrity, edema, etc.): vss on RA      Pertinent Vitals/Pain Pain Assessment: Faces Faces Pain Scale: Hurts little more Pain Location: generalized, peri-area with clean up due to raw areas and skin breakdown on penis (tip and posterior surface near base) Pain Descriptors / Indicators: Grimacing;Guarding Pain Intervention(s): Monitored during session    Home Living                      Prior Function            PT Goals (current goals can now be found in the care plan section) Acute Rehab PT Goals Patient Stated Goal: None stated PT Goal Formulation: With patient Time For Goal Achievement: 11/26/20 Potential to Achieve Goals:  Poor (to fair) Progress towards PT goals: Not progressing toward goals - comment (improvements in arousal, not functional)    Frequency    Min 2X/week      PT Plan Current plan remains appropriate;Frequency needs to be updated    Co-evaluation PT/OT/SLP Co-Evaluation/Treatment: Yes Reason for Co-Treatment: Complexity of the patient's impairments (multi-system involvement) PT goals addressed during session: Mobility/safety with mobility OT goals addressed during session: ADL's and self-care      AM-PAC PT "6 Clicks" Mobility   Outcome Measure  Help needed turning from your back to your side while in a flat bed without using bedrails?: Total Help needed moving from lying on your back to sitting on the side of a flat bed without using bedrails?: Total Help needed moving to and from a bed to a chair (including a wheelchair)?: Total Help needed standing up from a chair using your arms (e.g., wheelchair or bedside chair)?: Total Help needed to walk in hospital room?: Total Help needed climbing 3-5 steps with a railing? : Total 6 Click Score: 6    End of Session   Activity Tolerance: Patient tolerated treatment well Patient left: in chair;with call Geisen/phone within reach;with chair alarm set (on lift pad) Nurse Communication: Mobility status PT Visit Diagnosis: Muscle weakness (generalized) (M62.81);Other symptoms and signs involving the nervous system (R29.898);Other abnormalities of gait and mobility (R26.89)     Time: [id number] Time Calculation (min) (ACUTE ONLY): 39 min  Charges:  $Therapeutic Activity: 23-37 mins                     11/18/2020  KYvone Neu  M., PT Acute Rehabilitation Services [phone]  (pager) [phone]  (office)   Clifford [name] 11/18/2020, 2:19 PM

## 2020-11-19 DIAGNOSIS — R531 Weakness: Secondary | ICD-10-CM | POA: Diagnosis not present

## 2020-11-19 DIAGNOSIS — I619 Nontraumatic intracerebral hemorrhage, unspecified: Secondary | ICD-10-CM | POA: Diagnosis not present

## 2020-11-19 LAB — BASIC METABOLIC PANEL
Anion gap: 7 (ref 5–15)
BUN: 39 mg/dL — ABNORMAL HIGH (ref 8–23)
CO2: 21 mmol/L — ABNORMAL LOW (ref 22–32)
Calcium: 8.8 mg/dL — ABNORMAL LOW (ref 8.9–10.3)
Chloride: 113 mmol/L — ABNORMAL HIGH (ref 98–111)
Creatinine, Ser: 1.51 mg/dL — ABNORMAL HIGH (ref 0.61–1.24)
GFR, Estimated: 49 mL/min — ABNORMAL LOW (ref >60)
Glucose, Bld: 109 mg/dL — ABNORMAL HIGH (ref 70–99)
Potassium: 4 mmol/L (ref 3.5–5.1)
Sodium: 141 mmol/L (ref 135–145)

## 2020-11-19 LAB — CBC
HCT: 33.4 % — ABNORMAL LOW (ref 39.0–52.0)
Hemoglobin: 10.4 g/dL — ABNORMAL LOW (ref 13.0–17.0)
MCH: 25.8 pg — ABNORMAL LOW (ref 26.0–34.0)
MCHC: 31.1 g/dL (ref 30.0–36.0)
MCV: 82.9 fL (ref 80.0–100.0)
Platelets: 111 10*3/uL — ABNORMAL LOW (ref 150–400)
RBC: 4.03 MIL/uL — ABNORMAL LOW (ref 4.22–5.81)
RDW: 15.3 % (ref 11.5–15.5)
WBC: 7.6 10*3/uL (ref 4.0–10.5)
nRBC: 0 % (ref 0.0–0.2)

## 2020-11-19 LAB — GLUCOSE, CAPILLARY
Glucose-Capillary: 106 mg/dL — ABNORMAL HIGH (ref 70–99)
Glucose-Capillary: 109 mg/dL — ABNORMAL HIGH (ref 70–99)
Glucose-Capillary: 112 mg/dL — ABNORMAL HIGH (ref 70–99)
Glucose-Capillary: 115 mg/dL — ABNORMAL HIGH (ref 70–99)
Glucose-Capillary: 123 mg/dL — ABNORMAL HIGH (ref 70–99)
Glucose-Capillary: 93 mg/dL (ref 70–99)

## 2020-11-19 NOTE — Progress Notes (Signed)
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  He had an initial stroke on 10/06/20, was admitted for 2 days then discharged with home health.  After about 4 days at home, he developed worsening of his stroke symptoms, returned to the hospital.  In the ER, He is found to have enlargement of his previous stroke with microhemorrhage.  Initially had relatively mild deficits , NIHSS 4, so his Pradaxa was held and he was admitted for SNF placement for short-term rehab.  However, he developed progressively worsening left hemiparesis, then somnolence.  Neurology were reconsulted.  Serial CT head on 8/16 showed expected evolution/progression of his stroke, further edema but no herniation or  substantial midline shift. MRI ; 8/25: increasing midline shift.  Neurology following the patient and recommended further supportive care and SNF placement.  NG tube was placed as patient lost ability to swallow.  Palliative care were consulted and in detail family discussion, with prognosis provided by neurology (more likely able to regain ability to talk and participate in self-care, unlikely to regain ability to walk, uncertain if able to regain ability to swallow, very unlikely to improve to full independence), it was decided to place PEG and pursue a SNF for rehab.   Awaiting skilled nursing facility.  Assessment & Plan:   Principal Problem:   Left-sided weakness Active Problems:   Stroke [facility])   Essential hypertension   Mixed hyperlipidemia   Prediabetes   History of pulmonary embolism   Chronic anticoagulation   Nicotine dependence, cigarettes, uncomplicated   Petechial hemorrhage    Acute stroke due to ischemia [facility])   Advanced care planning/counseling discussion   Goals of care, counseling/discussion   Palliative care by specialist  Progression of previous right MCA/PCA territory infarct with mild edema now with midline shift on repeat MRI 10/28/20. MRI showed mild enlargement with cytotoxic edema and petechial hemorrhage with increased midline shift compared to 10/07/2020. Neurology suspect his current poor functional status is mostly due to the swelling, with a component of this from free water and a component of this from bleeding. He remains with significant dysphagia, PEG tube placed by IR 8/30 Patient was started on Topamax for headache. Repeat CT head 8/30 showing progressive CVA, midline shift is 10 mm as opposed to 14 mm on MRI 8/25. Due to sinus tachycardia of unclear cause, CT head was repeated on 9/2 which showed a stable CVA and midline shift is now 6 mm. Awaiting skilled nursing facility. Patient has waxing and waning in his mental status.  He is alert , he was able to follow commands. Repeated CT head 9/13; stable.    Fever : > Resolved. Patient spiked fever of 102.4 on 9/10. He was pancultured, started  IV antibiotics on 9/10 vanc and cefepime. Vancomycin DC 9/11. Chest x ray  No acute diseases, low lung volume..  UA; Negative, Urine culture: 10,000 colonies.  Blood culture: No growth To date.  Covid test negative 9/10. MRSA PCR negative.  Completed 5 days course of cefepime.   Hypertension: Continue amlodipine and hydralazine. Continue Metoprolol for tachycardia.   Suspected  focal seizure: Presented with left hand twitching which was nonsuppressible and clinically suspicious for stroke and was  started on Keppra. Keppra dose reduced on  8/24 to eliminate sedating side effect from Keppra.  Provigil  9/3.    Cough : > Resolved. Chest x-ray unremarkable. Continue Flutter valve.   Hypernatremia: > Resolved. Due to decreased free water, monitor  overall stable. Discussed with Dr Erlinda Hong, ok to start correcting sodium level with free water.  Improved. IV fluids discontinued.  Acute kidney injury on CKD stage III A: Baseline serum creatinine 1.2-1.4 Improved,  back to baseline.   History of VTE : Continue Eliquis.   Dysphagia :  Continue PEG tube.  NPO    Lung nodule: Incidental finding of 8 mm left upper lobe nodule.   Need to follow-up CT 6 to 12 months depending on neurological progression   Possible sclerotic vertebra on imaging: Incidental finding.  PSA was 5.  Follow-up as an outpatient.   Chronic diastolic heart failure: Appears euvolemic.  Hyperkalemia; Resolved with Lokelma.  Right eye pain: Case was discussed with ophthalmology Dr. Posey Pronto, states could be related to abrasion from a stroke. Continue ointment.  Thrombocytopenia; stable. Monitor.   GI, Loose Stool;  > Resolved. Nutritionist consulted, to see if formula can be changed.  Started  florastore.  Stool is not watery to required rectal tube.    Nutrition Problem: Inadequate oral intake Etiology: dysphagia    DVT prophylaxis: Eliquis Code Status: DNR Family Communication: No family at bed side. Disposition Plan:  Status is: Inpatient  Remains inpatient appropriate because:Inpatient level of care appropriate due to severity of illness  Dispo: The patient is from: Home              Anticipated d/c is to: SNF              Patient currently is not medically stable to d/c.   Difficult to place patient No  Consultants:  Palliative  Neurology  Procedures: PEG tube placement Antimicrobials: None  Subjective: Patient was seen and examined at bedside.  Overnight events noted.   Patient was lying comfortably on the bed.  He is alert and oriented x2 .  He is able to lift his right arm.  Objective: Vitals:   11/18/20 2011 11/18/20 2348 11/19/20 0351 11/19/20 0500  BP: 139/72 (!) 144/86 134/76   Pulse: 78 84 61   Resp: '18 18 19   '$ Temp: 98.8 F  (37.1 C) 99 F (37.2 C) 98.5 F (36.9 C)   TempSrc: Oral Oral Oral   SpO2: 100% 100% 100%   Weight:    83.8 kg  Height:        Intake/Output Summary (Last 24 hours) at 11/19/2020 1454 Last data filed at 11/18/2020 1702 Gross per 24 hour  Intake --  Output 350 ml  Net -350 ml   Filed Weights   11/16/20 0500 11/18/20 0549 11/19/20 0500  Weight: 85.4 kg 86 kg 83.8 kg    Examination:  General exam: Appears comfortable, deconditioned, not in any acute distress. Respiratory system: Clear to auscultation bilaterally, no wheezing. Cardiovascular system: S1-S2 heard, regular rate and rhythm, no murmur. Gastrointestinal system: Abdomen is soft, nontender, nondistended, BS+. Central nervous system: Alert and oriented x 2.  Left-sided weakness. Extremities: No edema, no cyanosis, no clubbing. Skin: No rashes, lesions or ulcers Psychiatry: Mood and affect appropriate.    Data Reviewed: I have personally reviewed following labs and imaging studies  CBC: Recent Labs  Lab 11/13/20 0902 11/15/20 0353 11/16/20 1017 11/18/20 0339 11/19/20 0342  WBC 5.5 7.4 6.4 6.7 7.6  HGB 10.7* 11.2* 11.1* 11.1* 10.4*  HCT 35.8* 35.6* 34.8* 35.5* 33.4*  MCV 86.5 83.8 81.9 82.8 82.9  PLT 91* 93* 91* 103* [id number]*   Basic Metabolic Panel: Recent Labs  Lab 11/14/20 0222 11/15/20 0353 11/16/20 1017 11/18/20 0339 11/19/20 0342  NA 147* 143 142 142 141  K 3.9 4.6 3.5 4.1 4.0  CL 123* 118* 115* 113* 113*  CO2 18* 20* 19* 20* 21*  GLUCOSE 124* 103* 136* 121* 109*  BUN 47* 38* 32* 36* 39*  CREATININE 1.46* 1.46* 1.28* 1.39* 1.51*  CALCIUM 8.2* 8.3* 8.4* 8.6* 8.8*  MG  --   --   --  2.0  --   PHOS  --   --   --  3.5  --    GFR: Estimated Creatinine Clearance: 44 mL/min (A) (by C-G formula based on SCr of 1.51 mg/dL (H)). Liver Function Tests: No results for input(s): AST, ALT, ALKPHOS, BILITOT, PROT, ALBUMIN in the last 168 hours.  No results for input(s): LIPASE, AMYLASE in the last 168  hours. No results for input(s): AMMONIA in the last 168 hours. Coagulation Profile: No results for input(s): INR, PROTIME in the last 168 hours. Cardiac Enzymes: No results for input(s): CKTOTAL, CKMB, CKMBINDEX, TROPONINI in the last 168 hours. BNP (last 3 results) No results for input(s): PROBNP in the last 8760 hours. HbA1C: No results for input(s): HGBA1C in the last 72 hours. CBG: Recent Labs  Lab 11/18/20 2028 11/18/20 2324 11/19/20 0415 11/19/20 0825 11/19/20 1228  GLUCAP 95 106* 106* 123* 109*   Lipid Profile: No results for input(s): CHOL, HDL, LDLCALC, TRIG, CHOLHDL, LDLDIRECT in the last 72 hours. Thyroid Function Tests: No results for input(s): TSH, T4TOTAL, FREET4, T3FREE, THYROIDAB in the last 72 hours. Anemia Panel: No results for input(s): VITAMINB12, FOLATE, FERRITIN, TIBC, IRON, RETICCTPCT in the last 72 hours. Sepsis Labs: No results for input(s): PROCALCITON, LATICACIDVEN in the last 168 hours.  Recent Results (from the past 240 hour(s))  Urine Culture     Status: Abnormal   Collection Time: 11/13/20  8:24 AM   Specimen: Urine, Clean Catch  Result Value Ref Range Status   Specimen Description URINE, CLEAN CATCH  Final   Special Requests NONE  Final   Culture (A)  Final    <10,000 COLONIES/mL INSIGNIFICANT GROWTH Performed at [facility], [address], [place], [place] [address]    Report Status 11/14/2020 FINAL  Final  MRSA Next Gen by PCR, Nasal     Status: None   Collection Time: 11/13/20  8:25 AM   Specimen: Nasal Mucosa; Nasal Swab  Result Value Ref Range Status   MRSA by PCR Next Gen NOT DETECTED NOT DETECTED Final    Comment: (NOTE) The GeneXpert MRSA Assay (FDA approved for NASAL specimens only), is one component of a comprehensive MRSA colonization surveillance program. It is not intended to diagnose MRSA infection nor to guide or monitor treatment for MRSA infections. Test performance is not FDA approved in patients less than 34  years old. Performed at [facility], [place] [address], [place],  [address]   Culture, blood (routine x 2)     Status: None   Collection Time: 11/13/20  9:02 AM   Specimen: BLOOD  Result Value Ref Range Status   Specimen Description BLOOD RIGHT ANTECUBITAL  Final   Special Requests   Final    BOTTLES DRAWN AEROBIC ONLY Blood Culture adequate volume   Culture   Final  NO GROWTH 5 DAYS Performed at [facility], [place] [address], [place], [place] [address]    Report Status 11/18/2020 FINAL  Final  Culture, blood (routine x 2)     Status: None   Collection Time: 11/13/20  9:08 AM   Specimen: BLOOD  Result Value Ref Range Status   Specimen Description BLOOD LEFT ANTECUBITAL  Final   Special Requests   Final    BOTTLES DRAWN AEROBIC AND ANAEROBIC Blood Culture adequate volume   Culture   Final    NO GROWTH 5 DAYS Performed at [facility], [place] [address] [place], [place] [address]    Report Status 11/18/2020 FINAL  Final  Resp Panel by RT-PCR (Flu A&B, Covid) Nasopharyngeal Swab     Status: None   Collection Time: 11/13/20  5:07 PM   Specimen: Nasopharyngeal Swab; Nasopharyngeal(NP) swabs in vial transport medium  Result Value Ref Range Status   SARS Coronavirus 2 by RT PCR NEGATIVE NEGATIVE Final    Comment: (NOTE) SARS-CoV-2 target nucleic acids are NOT DETECTED.  The SARS-CoV-2 RNA is generally detectable in upper respiratory specimens during the acute phase of infection. The lowest concentration of SARS-CoV-2 viral copies this assay can detect is 138 copies/mL. A negative result does not preclude SARS-Cov-2 infection and should not be used as the sole basis for treatment or other patient management decisions. A negative result may occur with  improper specimen collection/handling, submission of specimen other than nasopharyngeal swab, presence of viral mutation(s) within the areas targeted by this assay, and inadequate number of viral copies(<138  copies/mL). A negative result must be combined with clinical observations, patient history, and epidemiological information. The expected result is Negative.  Fact Sheet for Patients:  EntrepreneurPulse.com.au  Fact Sheet for Healthcare Providers:  IncredibleEmployment.be  This test is no t yet approved or cleared by the [place] FDA and  has been authorized for detection and/or diagnosis of SARS-CoV-2 by FDA under an Emergency Use Authorization (EUA). This EUA will remain  in effect (meaning this test can be used) for the duration of the COVID-19 declaration under Section 564(b)(1) of the Act, 21 U.S.C.section 360bbb-3(b)(1), unless the authorization is terminated  or revoked sooner.       Influenza A by PCR NEGATIVE NEGATIVE Final   Influenza B by PCR NEGATIVE NEGATIVE Final    Comment: (NOTE) The Xpert Xpress SARS-CoV-2/FLU/RSV plus assay is intended as an aid in the diagnosis of influenza from Nasopharyngeal swab specimens and should not be used as a sole basis for treatment. Nasal washings and aspirates are unacceptable for Xpert Xpress SARS-CoV-2/FLU/RSV testing.  Fact Sheet for Patients: EntrepreneurPulse.com.au  Fact Sheet for Healthcare Providers: IncredibleEmployment.be  This test is not yet approved or cleared by the [place] FDA and has been authorized for detection and/or diagnosis of SARS-CoV-2 by FDA under an Emergency Use Authorization (EUA). This EUA will remain in effect (meaning this test can be used) for the duration of the COVID-19 declaration under Section 564(b)(1) of the Act, 21 U.S.C. section 360bbb-3(b)(1), unless the authorization is terminated or revoked.  Performed at [facility], [place] [address] [place], [place] [address]     Radiology Studies: No results found.  Scheduled Meds:  amLODipine  10 mg Per Tube Daily   apixaban  5 mg Per Tube BID    atorvastatin  40 mg Per Tube Daily   erythromycin   Right Eye TID   feeding supplement (PROSource TF)  45 mL  Per Tube BID   free water  200 mL Per Tube Q4H   hydrALAZINE  25 mg Per Tube QID   influenza vaccine adjuvanted  0.5 mL Intramuscular Tomorrow-1000   insulin aspart  0-9 Units Subcutaneous Q4H   levETIRAcetam  250 mg Per Tube BID   mouth rinse  15 mL Mouth Rinse BID   metoprolol tartrate  12.5 mg Per Tube BID   modafinil  100 mg Per Tube Daily   nystatin  5 mL Mouth/Throat QID   pantoprazole sodium  40 mg Per Tube Daily   saccharomyces boulardii  250 mg Per Tube BID   topiramate  50 mg Per Tube BID   Continuous Infusions:  feeding supplement (JEVITY 1.5 CAL/FIBER) 1,000 mL (11/19/20 0531)     LOS: 36 days    Time spent: 25 mins    Nea Gittens, MD [facility]   If 7PM-7AM, please contact night-coverage

## 2020-11-19 NOTE — Plan of Care (Signed)

## 2020-11-19 NOTE — Progress Notes (Signed)
Speech Language Pathology Treatment: Dysphagia;Cognitive-Linquistic  Patient Details Name: Clifford [name] MRN: [id number] DOB: 14-Jul-1950 Today's Date: 11/19/2020 Time: [id number] SLP Time Calculation (min) (ACUTE ONLY): 32 min  Assessment / Plan / Recommendation Clinical Impression  Pt alert and engaging in simple conversation with SLP upon arrival for skilled dysphagia and cognitive-linguistic treatment. Speech intelligibility is improved and even clearer with mod-max cues for over articulation and increase of vocal intensity. He continues to require heavy multimodal cues to navigate to L visual field, but was able to slightly cross over midline to accept ice chips and sips of thin via teaspoon. Ice chips were orally manipulated, though slow and prolonged and sips of thin were actively sucked from teaspoon. Anterior spillage of liquid POs observed from L side and swallow initiation was noted via palpation. Occasional delay in swallow response appeared dependent upon pt's sustained attention at a given moment. Immediate cough x1 and occasional throat clear noted across trials. Pt verbally requested bites of puree x3 with no overt s/sx of aspiration noted, but L pocketing in buccal cavity observed upon inspection.   Recommend maintain NPO status with ice chips OK after oral care and when pt is alert. Given improvements in mentation and overall endurance, repeat instrumental assessment indicated to determine current swallow physiology. Will complete as schedule allows, but suspect early next week. RN and family notified.    HPI HPI: 70yo male admitted 10/14/20 with right side headache and LUE weakness. 10/19/20 pt was noted to have increased LUE weakness. 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 CTHead = evolving RMCA infarct with cytotoxic edema, mass effect      SLP Plan  Continue with current plan of care      Recommendations  for follow up therapy are one component of a multi-disciplinary discharge planning process, led by the attending physician.  Recommendations may be updated based on patient status, additional functional criteria and insurance authorization.    Recommendations  Diet recommendations: NPO;Other(comment) (May haev ice chips after oral care) Liquids provided via: Teaspoon Medication Administration: Via alternative means Supervision: Full supervision/cueing for compensatory strategies Compensations: Minimize environmental distractions;Slow rate Postural Changes and/or Swallow Maneuvers: Seated upright 90 degrees                Oral Care Recommendations: Oral care prior to ice chip/H20;Oral care BID;Staff/trained caregiver to provide oral care Follow up Recommendations: Skilled Nursing facility SLP Visit Diagnosis: Dysphagia, oropharyngeal phase (R13.12);Dysarthria and anarthria (R47.1);Cognitive communication deficit (R41.841) Plan: Continue with current plan of care       [place], [place], [place] Office Number: [phone]   KILLION HOMRICH  11/19/2020, 12:40 PM

## 2020-11-20 DIAGNOSIS — R531 Weakness: Secondary | ICD-10-CM | POA: Diagnosis not present

## 2020-11-20 LAB — GLUCOSE, CAPILLARY
Glucose-Capillary: 107 mg/dL — ABNORMAL HIGH (ref 70–99)
Glucose-Capillary: 107 mg/dL — ABNORMAL HIGH (ref 70–99)
Glucose-Capillary: 122 mg/dL — ABNORMAL HIGH (ref 70–99)
Glucose-Capillary: 126 mg/dL — ABNORMAL HIGH (ref 70–99)
Glucose-Capillary: 129 mg/dL — ABNORMAL HIGH (ref 70–99)
Glucose-Capillary: 152 mg/dL — ABNORMAL HIGH (ref 70–99)

## 2020-11-20 NOTE — Progress Notes (Signed)
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  He had an initial stroke on 10/06/20, was admitted for 2 days then discharged with home health.  After about 4 days at home, he developed worsening of his stroke symptoms, returned to the hospital.  In the ER, He is found to have enlargement of his previous stroke with microhemorrhage.  Initially had relatively mild deficits , NIHSS 4, so his Pradaxa was held and he was admitted for SNF placement for short-term rehab.  However, he developed progressively worsening left hemiparesis, then somnolence.  Neurology were reconsulted.  Serial CT head on 8/16 showed expected evolution/progression of his stroke, further edema but no herniation or  substantial midline shift. MRI ; 8/25: increasing midline shift.  Neurology following the patient and recommended further supportive care and SNF placement.  NG tube was placed as patient lost ability to swallow.  Palliative care were consulted and in detail family discussion, with prognosis provided by neurology (more likely able to regain ability to talk and participate in self-care, unlikely to regain ability to walk, uncertain if able to regain ability to swallow, very unlikely to improve to full independence), it was decided to place PEG and pursue a SNF for rehab.   Awaiting skilled nursing facility.  Assessment & Plan:   Principal Problem:   Left-sided weakness Active Problems:   Stroke [facility])   Essential hypertension   Mixed hyperlipidemia   Prediabetes   History of pulmonary embolism   Chronic anticoagulation   Nicotine dependence, cigarettes, uncomplicated   Petechial hemorrhage    Acute stroke due to ischemia [facility])   Advanced care planning/counseling discussion   Goals of care, counseling/discussion   Palliative care by specialist  Progression of previous right MCA/PCA territory infarct with mild edema now with midline shift on repeat MRI 10/28/20. MRI showed mild enlargement with cytotoxic edema and petechial hemorrhage with increased midline shift compared to 10/07/2020. Neurology suspect his current poor functional status is mostly due to the swelling, with a component of this from free water and a component of this from bleeding. He remains with significant dysphagia, PEG tube placed by IR 8/30 Patient was started on Topamax for headache. Repeat CT head 8/30 showing progressive CVA, midline shift is 10 mm as opposed to 14 mm on MRI 8/25. Due to sinus tachycardia of unclear cause, CT head was repeated on 9/2 which showed a stable CVA and midline shift is now 6 mm. Awaiting skilled nursing facility. Patient has waxing and waning in his mental status.  He is alert , he was able to follow commands. Repeated CT head 9/13; stable.    Fever : > Resolved. Patient spiked fever of 102.4 on 9/10. He was pancultured, started  IV antibiotics on 9/10 vanc and cefepime. Vancomycin DC 9/11. Chest x ray  No acute diseases, low lung volume..  UA; Negative, Urine culture: 10,000 colonies.  Blood culture: No growth To date.  Covid test negative 9/10. MRSA PCR negative.  Completed 5 days course of cefepime.   Hypertension: Continue amlodipine and hydralazine. Continue Metoprolol for tachycardia.   Suspected  focal seizure: Presented with left hand twitching which was nonsuppressible and clinically suspicious for stroke and was  started on Keppra. Keppra dose reduced on  8/24 to eliminate sedating side effect from Keppra.  Provigil  9/3.    Cough : > Resolved. Chest x-ray unremarkable. Continue Flutter valve.   Hypernatremia: > Resolved. Improved. IV fluids  discontinued.  Acute kidney injury on CKD stage III A: Baseline serum creatinine 1.2-1.4 Improved,  back to baseline.   History of VTE : Continue Eliquis.   Dysphagia :  Continue PEG tube.  NPO    Lung nodule: Incidental finding of 8 mm left upper lobe nodule.   Need to follow-up CT 6 to 12 months depending on neurological progression.   Possible sclerotic vertebra on imaging: Incidental finding.  PSA was 5.  Follow-up as an outpatient.   Chronic diastolic heart failure: Appears euvolemic.  Hyperkalemia; Resolved with Lokelma.  Right eye pain: Case was discussed with ophthalmology Dr. Posey Pronto, states could be related to abrasion from a stroke. Continue ointment.  Thrombocytopenia; stable. Monitor.   GI, Loose Stool;  > Resolved. Started  florastore.  Stool is not watery to required rectal tube.    Nutrition Problem: Inadequate oral intake Etiology: dysphagia    DVT prophylaxis: Eliquis Code Status: DNR Family Communication: No family at bed side. Disposition Plan:  Status is: Inpatient  Remains inpatient appropriate because:Inpatient level of care appropriate due to severity of illness  Dispo: The patient is from: Home              Anticipated d/c is to: SNF              Patient currently is not medically stable to d/c.   Difficult to place patient No  Consultants:  Palliative  Neurology  Procedures: PEG tube placement.  Antimicrobials: None  Subjective: Patient was seen and examined at bedside.  Overnight events noted.   Patient was lying comfortably on the bed.  He is alert and oriented x 2.  He is able to lift his right arm.  Objective: Vitals:   11/20/20 0421 11/20/20 0700 11/20/20 0720 11/20/20 1145  BP: (!) 129/97  (!) 148/82 138/78  Pulse: 67  66 63  Resp: '15  18 20  '$ Temp: (!) 97.4 F (36.3 C)  97.8 F (36.6 C) 98.5 F (36.9 C)  TempSrc: Oral  Oral Oral  SpO2: 100%  100% 100%  Weight:  83.6 kg    Height:        Intake/Output Summary  (Last 24 hours) at 11/20/2020 1432 Last data filed at 11/20/2020 1025 Gross per 24 hour  Intake 3115 ml  Output 1100 ml  Net 2015 ml   Filed Weights   11/18/20 0549 11/19/20 0500 11/20/20 0700  Weight: 86 kg 83.8 kg 83.6 kg    Examination:  General exam: Appears comfortable, deconditioned, not in any acute distress. Respiratory system: Clear to auscultation bilaterally, no wheezing. Cardiovascular system: S1-S2 heard, regular rate and rhythm, no murmur. Gastrointestinal system: Abdomen is soft, nontender, nondistended, BS+. Central nervous system: Alert and oriented x 2, left-sided weakness.   Saliva noted dripping from left edge of mouth. Extremities: No edema, no cyanosis, no clubbing. Skin: No rashes, lesions or ulcers Psychiatry: Mood and affect appropriate.    Data Reviewed: I have personally reviewed following labs and imaging studies  CBC: Recent Labs  Lab 11/15/20 0353 11/16/20 1017 11/18/20 0339 11/19/20 0342  WBC 7.4 6.4 6.7 7.6  HGB 11.2* 11.1* 11.1* 10.4*  HCT 35.6* 34.8* 35.5* 33.4*  MCV 83.8 81.9 82.8 82.9  PLT 93* 91* 103*  [id number]*   Basic Metabolic Panel: Recent Labs  Lab 11/14/20 0222 11/15/20 0353 11/16/20 1017 11/18/20 0339 11/19/20 0342  NA 147* 143 142 142 141  K 3.9 4.6 3.5 4.1 4.0  CL 123* 118* 115* 113* 113*  CO2 18* 20* 19* 20* 21*  GLUCOSE 124* 103* 136* 121* 109*  BUN 47* 38* 32* 36* 39*  CREATININE 1.46* 1.46* 1.28* 1.39* 1.51*  CALCIUM 8.2* 8.3* 8.4* 8.6* 8.8*  MG  --   --   --  2.0  --   PHOS  --   --   --  3.5  --    GFR: Estimated Creatinine Clearance: 44 mL/min (A) (by C-G formula based on SCr of 1.51 mg/dL (H)). Liver Function Tests: No results for input(s): AST, ALT, ALKPHOS, BILITOT, PROT, ALBUMIN in the last 168 hours.  No results for input(s): LIPASE, AMYLASE in the last 168 hours. No results for input(s): AMMONIA in the last 168 hours. Coagulation Profile: No results for input(s): INR, PROTIME in the last 168  hours. Cardiac Enzymes: No results for input(s): CKTOTAL, CKMB, CKMBINDEX, TROPONINI in the last 168 hours. BNP (last 3 results) No results for input(s): PROBNP in the last 8760 hours. HbA1C: No results for input(s): HGBA1C in the last 72 hours. CBG: Recent Labs  Lab 11/19/20 2003 11/19/20 2354 11/20/20 0421 11/20/20 0757 11/20/20 1220  GLUCAP 115* 112* 126* 107* 122*   Lipid Profile: No results for input(s): CHOL, HDL, LDLCALC, TRIG, CHOLHDL, LDLDIRECT in the last 72 hours. Thyroid Function Tests: No results for input(s): TSH, T4TOTAL, FREET4, T3FREE, THYROIDAB in the last 72 hours. Anemia Panel: No results for input(s): VITAMINB12, FOLATE, FERRITIN, TIBC, IRON, RETICCTPCT in the last 72 hours. Sepsis Labs: No results for input(s): PROCALCITON, LATICACIDVEN in the last 168 hours.  Recent Results (from the past 240 hour(s))  Urine Culture     Status: Abnormal   Collection Time: 11/13/20  8:24 AM   Specimen: Urine, Clean Catch  Result Value Ref Range Status   Specimen Description URINE, CLEAN CATCH  Final   Special Requests NONE  Final   Culture (A)  Final    <10,000 COLONIES/mL INSIGNIFICANT GROWTH Performed at [facility], [address], [place], [place] [address]    Report Status 11/14/2020 FINAL  Final  MRSA Next Gen by PCR, Nasal     Status: None   Collection Time: 11/13/20  8:25 AM   Specimen: Nasal Mucosa; Nasal Swab  Result Value Ref Range Status   MRSA by PCR Next Gen NOT DETECTED NOT DETECTED Final    Comment: (NOTE) The GeneXpert MRSA Assay (FDA approved for NASAL specimens only), is one component of a comprehensive MRSA colonization surveillance program. It is not intended to diagnose MRSA infection nor to guide or monitor treatment for MRSA infections. Test performance is not FDA approved in patients less than 39 years old. Performed at [facility], [place] [address] [place], [place] [address]   Culture, blood (routine x 2)     Status: None    Collection Time: 11/13/20  9:02 AM   Specimen: BLOOD  Result Value Ref Range Status   Specimen Description BLOOD RIGHT ANTECUBITAL  Final   Special Requests   Final    BOTTLES DRAWN AEROBIC ONLY Blood Culture adequate volume   Culture   Final    NO GROWTH 5 DAYS Performed at [facility], [place] [address] [place], [place] [address]    Report Status 11/18/2020  FINAL  Final  Culture, blood (routine x 2)     Status: None   Collection Time: 11/13/20  9:08 AM   Specimen: BLOOD  Result Value Ref Range Status   Specimen Description BLOOD LEFT ANTECUBITAL  Final   Special Requests   Final    BOTTLES DRAWN AEROBIC AND ANAEROBIC Blood Culture adequate volume   Culture   Final    NO GROWTH 5 DAYS Performed at [facility], [address], [place], [place] [address]    Report Status 11/18/2020 FINAL  Final  Resp Panel by RT-PCR (Flu A&B, Covid) Nasopharyngeal Swab     Status: None   Collection Time: 11/13/20  5:07 PM   Specimen: Nasopharyngeal Swab; Nasopharyngeal(NP) swabs in vial transport medium  Result Value Ref Range Status   SARS Coronavirus 2 by RT PCR NEGATIVE NEGATIVE Final    Comment: (NOTE) SARS-CoV-2 target nucleic acids are NOT DETECTED.  The SARS-CoV-2 RNA is generally detectable in upper respiratory specimens during the acute phase of infection. The lowest concentration of SARS-CoV-2 viral copies this assay can detect is 138 copies/mL. A negative result does not preclude SARS-Cov-2 infection and should not be used as the sole basis for treatment or other patient management decisions. A negative result may occur with  improper specimen collection/handling, submission of specimen other than nasopharyngeal swab, presence of viral mutation(s) within the areas targeted by this assay, and inadequate number of viral copies(<138 copies/mL). A negative result must be combined with clinical observations, patient history, and epidemiological information. The expected  result is Negative.  Fact Sheet for Patients:  EntrepreneurPulse.com.au  Fact Sheet for Healthcare Providers:  IncredibleEmployment.be  This test is no t yet approved or cleared by the [place] FDA and  has been authorized for detection and/or diagnosis of SARS-CoV-2 by FDA under an Emergency Use Authorization (EUA). This EUA will remain  in effect (meaning this test can be used) for the duration of the COVID-19 declaration under Section 564(b)(1) of the Act, 21 U.S.C.section 360bbb-3(b)(1), unless the authorization is terminated  or revoked sooner.       Influenza A by PCR NEGATIVE NEGATIVE Final   Influenza B by PCR NEGATIVE NEGATIVE Final    Comment: (NOTE) The Xpert Xpress SARS-CoV-2/FLU/RSV plus assay is intended as an aid in the diagnosis of influenza from Nasopharyngeal swab specimens and should not be used as a sole basis for treatment. Nasal washings and aspirates are unacceptable for Xpert Xpress SARS-CoV-2/FLU/RSV testing.  Fact Sheet for Patients: EntrepreneurPulse.com.au  Fact Sheet for Healthcare Providers: IncredibleEmployment.be  This test is not yet approved or cleared by the [place] FDA and has been authorized for detection and/or diagnosis of SARS-CoV-2 by FDA under an Emergency Use Authorization (EUA). This EUA will remain in effect (meaning this test can be used) for the duration of the COVID-19 declaration under Section 564(b)(1) of the Act, 21 U.S.C. section 360bbb-3(b)(1), unless the authorization is terminated or revoked.  Performed at [facility], [place] [address] [place], [place] [address]     Radiology Studies: No results found.  Scheduled Meds:  amLODipine  10 mg Per Tube Daily   apixaban  5 mg Per Tube BID   atorvastatin  40 mg Per Tube Daily   erythromycin   Right Eye TID   feeding supplement (PROSource TF)  45 mL Per Tube BID   free water  200 mL  Per Tube Q4H   hydrALAZINE  25 mg Per Tube QID  insulin aspart  0-9 Units Subcutaneous Q4H   levETIRAcetam  250 mg Per Tube BID   mouth rinse  15 mL Mouth Rinse BID   metoprolol tartrate  12.5 mg Per Tube BID   modafinil  100 mg Per Tube Daily   nystatin  5 mL Mouth/Throat QID   pantoprazole sodium  40 mg Per Tube Daily   saccharomyces boulardii  250 mg Per Tube BID   topiramate  50 mg Per Tube BID   Continuous Infusions:  feeding supplement (JEVITY 1.5 CAL/FIBER) 1,000 mL (11/19/20 2338)     LOS: 37 days    Time spent: 25 mins    Bastien Strawser, MD [facility]   If 7PM-7AM, please contact night-coverage

## 2020-11-21 ENCOUNTER — Inpatient Hospital Stay (HOSPITAL_COMMUNITY): Payer: No Typology Code available for payment source

## 2020-11-21 DIAGNOSIS — R531 Weakness: Secondary | ICD-10-CM | POA: Diagnosis not present

## 2020-11-21 LAB — GLUCOSE, CAPILLARY
Glucose-Capillary: 101 mg/dL — ABNORMAL HIGH (ref 70–99)
Glucose-Capillary: 106 mg/dL — ABNORMAL HIGH (ref 70–99)
Glucose-Capillary: 108 mg/dL — ABNORMAL HIGH (ref 70–99)
Glucose-Capillary: 111 mg/dL — ABNORMAL HIGH (ref 70–99)
Glucose-Capillary: 116 mg/dL — ABNORMAL HIGH (ref 70–99)
Glucose-Capillary: 127 mg/dL — ABNORMAL HIGH (ref 70–99)

## 2020-11-21 NOTE — Progress Notes (Signed)
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  He had an initial stroke on 10/06/20, was admitted for 2 days then discharged with home health.  After about 4 days at home, he developed worsening of his stroke symptoms, returned to the hospital.  In the ER, He is found to have enlargement of his previous stroke with microhemorrhage.  Initially had relatively mild deficits , NIHSS 4, so his Pradaxa was held and he was admitted for SNF placement for short-term rehab.  However, he developed progressively worsening left hemiparesis, then somnolence.  Neurology were reconsulted.  Serial CT head on 8/16 showed expected evolution/progression of his stroke, further edema but no herniation or  substantial midline shift. MRI ; 8/25: increasing midline shift.  Neurology following the patient and recommended further supportive care and SNF placement.  NG tube was placed as patient lost ability to swallow.  Palliative care were consulted and in detail family discussion, with prognosis provided by neurology (more likely able to regain ability to talk and participate in self-care, unlikely to regain ability to walk, uncertain if able to regain ability to swallow, very unlikely to improve to full independence), it was decided to place PEG and pursue a SNF for rehab.   Awaiting skilled nursing facility.  Assessment & Plan:   Principal Problem:   Left-sided weakness Active Problems:   Stroke [facility])   Essential hypertension   Mixed hyperlipidemia   Prediabetes   History of pulmonary embolism   Chronic anticoagulation   Nicotine dependence, cigarettes, uncomplicated   Petechial hemorrhage    Acute stroke due to ischemia [facility])   Advanced care planning/counseling discussion   Goals of care, counseling/discussion   Palliative care by specialist  Progression of previous right MCA/PCA territory infarct with mild edema now with midline shift on repeat MRI 10/28/20. MRI showed mild enlargement with cytotoxic edema and petechial hemorrhage with increased midline shift compared to 10/07/2020. Neurology suspect his current poor functional status is mostly due to the swelling, with a component of this from free water and a component of this from bleeding. He remains with significant dysphagia, PEG tube placed by IR 8/30 Patient was started on Topamax for headache. Repeat CT head 8/30 showing progressive CVA, midline shift is 10 mm as opposed to 14 mm on MRI 8/25. Due to sinus tachycardia of unclear cause, CT head was repeated on 9/2 which showed a stable CVA and midline shift is now 6 mm. Awaiting skilled nursing facility. Patient has waxing and waning in his mental status.  He is alert , he was able to follow commands. Repeated CT head 9/13; stable.    Fever : > Resolved. Patient spiked fever of 102.4 on 9/10. He was pancultured, started  IV antibiotics on 9/10 vanc and cefepime. Vancomycin DC 9/11. Chest x ray  No acute diseases, low lung volume..  UA; Negative, Urine culture: 10,000 colonies.  Blood culture: No growth To date.  Covid test negative 9/10. MRSA PCR negative.  Completed 5 days course of cefepime.   Hypertension: Continue amlodipine and hydralazine. Continue Metoprolol for tachycardia.   Suspected  focal seizure: Presented with left hand twitching which was nonsuppressible and clinically suspicious for stroke and was  started on Keppra. Keppra dose reduced on  8/24 to eliminate sedating side effect from Keppra.  Provigil  9/3.    Cough :  Chest x-ray unremarkable. Continue Flutter valve. Patient reports having intermittent cough.  Repeat chest x-ray.    Hypernatremia: > Resolved. Improved. IV fluids discontinued.  Acute kidney injury on CKD stage III A: Baseline serum creatinine 1.2-1.4 Improved,  back to baseline.   History of VTE : Continue Eliquis.   Dysphagia :  Continue PEG tube.  NPO    Lung nodule: Incidental finding of 8 mm left upper lobe nodule.   Need to follow-up CT 6 to 12 months depending on neurological progression.   Possible sclerotic vertebra on imaging: Incidental finding.  PSA was 5.  Follow-up as an outpatient.   Chronic diastolic heart failure: Appears euvolemic.  Hyperkalemia; Resolved with Lokelma.  Right eye pain: Case was discussed with ophthalmology Dr. Posey Pronto, states could be related to abrasion from a stroke. Continue ointment.  Thrombocytopenia; stable. Monitor.   GI, Loose Stool;  > Resolved. Started  florastore.  Stool is not watery to required rectal tube.    Nutrition Problem: Inadequate oral intake Etiology: dysphagia    DVT prophylaxis: Eliquis Code Status: DNR Family Communication: Brother was at bedside. Disposition Plan:  Status is: Inpatient  Remains inpatient appropriate because:Inpatient level of care appropriate due to severity of illness  Dispo: The patient is from: Home              Anticipated d/c is to: SNF              Patient currently is not medically stable to d/c.   Difficult to place patient No  Consultants:  Palliative  Neurology  Procedures: PEG tube placement.  Antimicrobials: None  Subjective: Patient was seen and examined at bedside.  Overnight events noted.   Patient was lying comfortably on the bed.  He is alert and oriented x 2.   He is able to lift his right arm,  he complains of having cough.  Objective: Vitals:   11/21/20 0500 11/21/20 0528 11/21/20 0756 11/21/20 1200  BP:  131/75 (!) 155/78 (!) 145/71  Pulse:  80 82 81  Resp:  '18 16 18  '$ Temp:  97.9 F (36.6 C) 98.2 F (36.8 C) 98.9 F (37.2 C)  TempSrc:  Oral Oral Oral  SpO2:   100% 100% 99%  Weight: 83.6 kg     Height:        Intake/Output Summary (Last 24 hours) at 11/21/2020 1447 Last data filed at 11/21/2020 0757 Gross per 24 hour  Intake --  Output 350 ml  Net -350 ml   Filed Weights   11/19/20 0500 11/20/20 0700 11/21/20 0500  Weight: 83.8 kg 83.6 kg 83.6 kg    Examination:  General exam: Appears comfortable, deconditioned, not in any acute distress. Respiratory system: Clear to auscultation bilaterally, no wheezing. Cardiovascular system: S1-S2 heard, regular rate and rhythm, no murmur.   Alert gastrointestinal system: Abdomen is soft, nontender, nondistended, BS+. Central nervous system: Alert and Oriented x 2, left-sided weakness.   Saliva noted dripping from left edge of mouth. Extremities: No edema, no cyanosis, no clubbing. Skin: No rashes, lesions or ulcers Psychiatry: Mood and affect appropriate.    Data Reviewed: I have personally reviewed following labs and imaging studies  CBC: Recent Labs  Lab 11/15/20 0353 11/16/20 1017 11/18/20 0339 11/19/20 0342  WBC 7.4 6.4 6.7 7.6  HGB 11.2* 11.1* 11.1* 10.4*  HCT  35.6* 34.8* 35.5* 33.4*  MCV 83.8 81.9 82.8 82.9  PLT 93* 91* 103* [id number]*   Basic Metabolic Panel: Recent Labs  Lab 11/15/20 0353 11/16/20 1017 11/18/20 0339 11/19/20 0342  NA 143 142 142 141  K 4.6 3.5 4.1 4.0  CL 118* 115* 113* 113*  CO2 20* 19* 20* 21*  GLUCOSE 103* 136* 121* 109*  BUN 38* 32* 36* 39*  CREATININE 1.46* 1.28* 1.39* 1.51*  CALCIUM 8.3* 8.4* 8.6* 8.8*  MG  --   --  2.0  --   PHOS  --   --  3.5  --    GFR: Estimated Creatinine Clearance: 44 mL/min (A) (by C-G formula based on SCr of 1.51 mg/dL (H)). Liver Function Tests: No results for input(s): AST, ALT, ALKPHOS, BILITOT, PROT, ALBUMIN in the last 168 hours.  No results for input(s): LIPASE, AMYLASE in the last 168 hours. No results for input(s): AMMONIA in the last 168 hours. Coagulation Profile: No results for input(s): INR, PROTIME in  the last 168 hours. Cardiac Enzymes: No results for input(s): CKTOTAL, CKMB, CKMBINDEX, TROPONINI in the last 168 hours. BNP (last 3 results) No results for input(s): PROBNP in the last 8760 hours. HbA1C: No results for input(s): HGBA1C in the last 72 hours. CBG: Recent Labs  Lab 11/20/20 1953 11/20/20 2333 11/21/20 0426 11/21/20 0813 11/21/20 1248  GLUCAP 107* 152* 127* 108* 106*   Lipid Profile: No results for input(s): CHOL, HDL, LDLCALC, TRIG, CHOLHDL, LDLDIRECT in the last 72 hours. Thyroid Function Tests: No results for input(s): TSH, T4TOTAL, FREET4, T3FREE, THYROIDAB in the last 72 hours. Anemia Panel: No results for input(s): VITAMINB12, FOLATE, FERRITIN, TIBC, IRON, RETICCTPCT in the last 72 hours. Sepsis Labs: No results for input(s): PROCALCITON, LATICACIDVEN in the last 168 hours.  Recent Results (from the past 240 hour(s))  Urine Culture     Status: Abnormal   Collection Time: 11/13/20  8:24 AM   Specimen: Urine, Clean Catch  Result Value Ref Range Status   Specimen Description URINE, CLEAN CATCH  Final   Special Requests NONE  Final   Culture (A)  Final    <10,000 COLONIES/mL INSIGNIFICANT GROWTH Performed at [facility], [address], [place], [place] [address]    Report Status 11/14/2020 FINAL  Final  MRSA Next Gen by PCR, Nasal     Status: None   Collection Time: 11/13/20  8:25 AM   Specimen: Nasal Mucosa; Nasal Swab  Result Value Ref Range Status   MRSA by PCR Next Gen NOT DETECTED NOT DETECTED Final    Comment: (NOTE) The GeneXpert MRSA Assay (FDA approved for NASAL specimens only), is one component of a comprehensive MRSA colonization surveillance program. It is not intended to diagnose MRSA infection nor to guide or monitor treatment for MRSA infections. Test performance is not FDA approved in patients less than 34 years old. Performed at [facility], [place] [address] [place], [place] [address]   Culture, blood (routine x 2)      Status: None   Collection Time: 11/13/20  9:02 AM   Specimen: BLOOD  Result Value Ref Range Status   Specimen Description BLOOD RIGHT ANTECUBITAL  Final   Special Requests   Final    BOTTLES DRAWN AEROBIC ONLY Blood Culture adequate volume   Culture   Final    NO GROWTH 5 DAYS Performed at [facility], [place] [address], [place], [place] [address]    Report Status 11/18/2020 FINAL  Final  Culture, blood (routine x 2)     Status: None   Collection Time: 11/13/20  9:08 AM   Specimen: BLOOD  Result Value Ref Range Status   Specimen Description BLOOD LEFT ANTECUBITAL  Final   Special Requests   Final    BOTTLES DRAWN AEROBIC AND ANAEROBIC Blood Culture adequate volume   Culture   Final    NO GROWTH 5 DAYS Performed at [facility], [address], [place], [place] [address]    Report Status 11/18/2020 FINAL  Final  Resp Panel by RT-PCR (Flu A&B, Covid) Nasopharyngeal Swab     Status: None   Collection Time: 11/13/20  5:07 PM   Specimen: Nasopharyngeal Swab; Nasopharyngeal(NP) swabs in vial transport medium  Result Value Ref Range Status   SARS Coronavirus 2 by RT PCR NEGATIVE NEGATIVE Final    Comment: (NOTE) SARS-CoV-2 target nucleic acids are NOT DETECTED.  The SARS-CoV-2 RNA is generally detectable in upper respiratory specimens during the acute phase of infection. The lowest concentration of SARS-CoV-2 viral copies this assay can detect is 138 copies/mL. A negative result does not preclude SARS-Cov-2 infection and should not be used as the sole basis for treatment or other patient management decisions. A negative result may occur with  improper specimen collection/handling, submission of specimen other than nasopharyngeal swab, presence of viral mutation(s) within the areas targeted by this assay, and inadequate number of viral copies(<138 copies/mL). A negative result must be combined with clinical observations, patient history, and epidemiological information.  The expected result is Negative.  Fact Sheet for Patients:  EntrepreneurPulse.com.au  Fact Sheet for Healthcare Providers:  IncredibleEmployment.be  This test is no t yet approved or cleared by the [place] FDA and  has been authorized for detection and/or diagnosis of SARS-CoV-2 by FDA under an Emergency Use Authorization (EUA). This EUA will remain  in effect (meaning this test can be used) for the duration of the COVID-19 declaration under Section 564(b)(1) of the Act, 21 U.S.C.section 360bbb-3(b)(1), unless the authorization is terminated  or revoked sooner.       Influenza A by PCR NEGATIVE NEGATIVE Final   Influenza B by PCR NEGATIVE NEGATIVE Final    Comment: (NOTE) The Xpert Xpress SARS-CoV-2/FLU/RSV plus assay is intended as an aid in the diagnosis of influenza from Nasopharyngeal swab specimens and should not be used as a sole basis for treatment. Nasal washings and aspirates are unacceptable for Xpert Xpress SARS-CoV-2/FLU/RSV testing.  Fact Sheet for Patients: EntrepreneurPulse.com.au  Fact Sheet for Healthcare Providers: IncredibleEmployment.be  This test is not yet approved or cleared by the [place] FDA and has been authorized for detection and/or diagnosis of SARS-CoV-2 by FDA under an Emergency Use Authorization (EUA). This EUA will remain in effect (meaning this test can be used) for the duration of the COVID-19 declaration under Section 564(b)(1) of the Act, 21 U.S.C. section 360bbb-3(b)(1), unless the authorization is terminated or revoked.  Performed at [facility], [place] [address], [place], [place] [address]     Radiology Studies: No results found.  Scheduled Meds:  amLODipine  10 mg Per Tube Daily   apixaban  5 mg Per Tube BID   atorvastatin  40 mg Per Tube Daily   erythromycin   Right Eye TID   feeding supplement (PROSource TF)  45 mL Per Tube BID   free  water  200 mL Per Tube Q4H   hydrALAZINE  25 mg Per Tube QID   insulin  aspart  0-9 Units Subcutaneous Q4H   levETIRAcetam  250 mg Per Tube BID   mouth rinse  15 mL Mouth Rinse BID   metoprolol tartrate  12.5 mg Per Tube BID   modafinil  100 mg Per Tube Daily   nystatin  5 mL Mouth/Throat QID   pantoprazole sodium  40 mg Per Tube Daily   saccharomyces boulardii  250 mg Per Tube BID   topiramate  50 mg Per Tube BID   Continuous Infusions:  feeding supplement (JEVITY 1.5 CAL/FIBER) 1,000 mL (11/20/20 2124)     LOS: 38 days    Time spent: 25 mins    Ferrell Flam, MD [facility]   If 7PM-7AM, please contact night-coverage

## 2020-11-22 ENCOUNTER — Inpatient Hospital Stay (HOSPITAL_COMMUNITY): Payer: No Typology Code available for payment source

## 2020-11-22 DIAGNOSIS — I10 Essential (primary) hypertension: Secondary | ICD-10-CM | POA: Diagnosis not present

## 2020-11-22 DIAGNOSIS — I639 Cerebral infarction, unspecified: Secondary | ICD-10-CM | POA: Diagnosis not present

## 2020-11-22 DIAGNOSIS — E782 Mixed hyperlipidemia: Secondary | ICD-10-CM

## 2020-11-22 DIAGNOSIS — R1310 Dysphagia, unspecified: Secondary | ICD-10-CM | POA: Diagnosis not present

## 2020-11-22 LAB — GLUCOSE, CAPILLARY
Glucose-Capillary: 129 mg/dL — ABNORMAL HIGH (ref 70–99)
Glucose-Capillary: 118 mg/dL — ABNORMAL HIGH (ref 70–99)
Glucose-Capillary: 111 mg/dL — ABNORMAL HIGH (ref 70–99)
Glucose-Capillary: 127 mg/dL — ABNORMAL HIGH (ref 70–99)
Glucose-Capillary: 142 mg/dL — ABNORMAL HIGH (ref 70–99)

## 2020-11-22 MED ORDER — ACETAMINOPHEN 325 MG PO TABS
650.0000 mg | ORAL_TABLET | ORAL | Status: DC | PRN
  Administered 2020-11-29: 650 mg
  Filled 2020-11-22: qty 2

## 2020-11-22 MED ORDER — ACETAMINOPHEN 160 MG/5ML PO SOLN
650.0000 mg | ORAL | Status: DC | PRN
  Administered 2020-11-27: 650 mg
  Filled 2020-11-22: qty 20.3

## 2020-11-22 MED ORDER — ACETAMINOPHEN 650 MG RE SUPP: 650.0000 mg | RECTAL | Status: DC | PRN

## 2020-11-22 NOTE — Plan of Care (Signed)

## 2020-11-22 NOTE — Progress Notes (Signed)
Physical Therapy Treatment Patient Details Name: Clifford [name] MRN: [id number] DOB: 05/13/1950 Today's Date: 11/22/2020   History of Present Illness 70 yo male presented to ED on 8/11 for R sided headache and worsening L UE weakness. Patient recently admitted 8/3-8/5 for R CVA with L visual deficits and L sided weakness. MRI with mild enlargement of R MCA/border zone infarct since 10/07/20 MRI and increased cytotoxic edema and petechial hemorrhage without midline shift. Palliative Care consulted and in detailed family discussions, with prognosis provided by Neurology (more likely able to regain ability to talk and participate in self cares, unlikely to regain ability to walk, uncertain if able to regain ability to swallow, very unlikely to improve to full independence), it was decided to place PEG and pursue SNF rehab, PEG was placed 8/30. 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    PT Comments    Patient received in bed, he is more alert, answers occasional questions and follows direction 25% of time. Continues to require +2 max to total assist for functional mobility. Left neglect. He will continue to benefit from skilled PT while here to maximize independence.      Recommendations for follow up therapy are one component of a multi-disciplinary discharge planning process, led by the attending physician.  Recommendations may be updated based on patient status, additional functional criteria and insurance authorization.  Follow Up Recommendations  SNF;Supervision/Assistance - 24 hour     Equipment Recommendations  Other (comment) (TBD)    Recommendations for Other Services       Precautions / Restrictions Precautions Precautions: Fall Precaution Comments: L visual field deficit, R gaze preference, L inattention, L hemparesis; G-tube and abdominal binder; penile skin breakdown from long term condom cath use/moisture Restrictions Weight Bearing Restrictions: No     Mobility  Bed  Mobility Overal bed mobility: Needs Assistance Bed Mobility: Rolling Rolling: Max assist;+2 for physical assistance         General bed mobility comments: No tech assist available this session, therefore did not attempt to sit patient up. Assisted with rolling in bed. Requires max to total assist    Transfers                 General transfer comment: not attempted  Ambulation/Gait                 Stairs             Wheelchair Mobility    Modified Rankin (Stroke Patients Only) Modified Rankin (Stroke Patients Only) Pre-Morbid Rankin Score: Moderately severe disability Modified Rankin: Severe disability     Balance Overall balance assessment: Needs assistance                                          Cognition Arousal/Alertness: Awake/alert Behavior During Therapy: WFL for tasks assessed/performed Overall Cognitive Status: No family/caregiver present to determine baseline cognitive functioning Area of Impairment: Attention;Safety/judgement;Awareness;Problem solving;Following commands;Orientation                 Orientation Level: Disoriented to;Time;Situation;Place Current Attention Level: Sustained   Following Commands: Follows one step commands inconsistently Safety/Judgement: Decreased awareness of deficits;Decreased awareness of safety Awareness: Intellectual Problem Solving: Slow processing;Decreased initiation;Difficulty sequencing;Requires verbal cues;Requires tactile cues General Comments: Patient able to follow 25% of cues and will respond appropriately about 25% of the time. Other times he repeats what  I say, Left neglect.      Exercises Other Exercises Other Exercises: L LE ROM/stretching exercises Other Exercises: L UE PROM/stretch of flexors, increased tone in L UE Other Exercises: R hand gross grasp/finger extension x10 reps    General Comments        Pertinent Vitals/Pain Pain Assessment: Faces Faces  Pain Scale: Hurts little more Pain Location: with L LE movement Pain Descriptors / Indicators: Grimacing;Moaning Pain Intervention(s): Monitored during session;Limited activity within patient's tolerance;Repositioned    Home Living                      Prior Function            PT Goals (current goals can now be found in the care plan section) Acute Rehab PT Goals Time For Goal Achievement: 11/26/20 Potential to Achieve Goals: Poor Progress towards PT goals: Not progressing toward goals - comment (continues to require +2 max to total assist for mobility)    Frequency    Min 2X/week      PT Plan Current plan remains appropriate    Co-evaluation              AM-PAC PT "6 Clicks" Mobility   Outcome Measure  Help needed turning from your back to your side while in a flat bed without using bedrails?: Total Help needed moving from lying on your back to sitting on the side of a flat bed without using bedrails?: Total Help needed moving to and from a bed to a chair (including a wheelchair)?: Total Help needed standing up from a chair using your arms (e.g., wheelchair or bedside chair)?: Total Help needed to walk in hospital room?: Total Help needed climbing 3-5 steps with a railing? : Total 6 Click Score: 6    End of Session   Activity Tolerance: Patient tolerated treatment well Patient left: in bed;with bed alarm set Nurse Communication: Mobility status PT Visit Diagnosis: Muscle weakness (generalized) (M62.81);Other abnormalities of gait and mobility (R26.89);Hemiplegia and hemiparesis Hemiplegia - Right/Left: Left     Time: 2947-6546 PT Time Calculation (min) (ACUTE ONLY): 17 min  Charges:  $Therapeutic Exercise: 8-22 mins                     Lalanya Rufener, PT, GCS 11/22/20,10:49 AM

## 2020-11-22 NOTE — Progress Notes (Signed)
PROGRESS NOTE        PATIENT DETAILS Name: Clifford [name] Age: 70 y.o. Sex: male Date of Birth: [birth year] Admit Date: 10/14/2020 Admitting Physician Vernelle Emerald, MD PCP:Pcp, No  Brief Narrative: Patient is a 70 y.o. 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  He was managed with supportive care-hospital course was complicated by worsening somnolence-further imaging studies showed cytotoxic edema with some mass-effect on the right lateral ventricle.  Neurology continued to follow closely-he was managed with continued supportive care.  He continued to exhibit significant left-sided hemiplegia/dysphagia-ultimately required G-tube placement.  Plans are for SNF on discharge.  Subjective: Lying comfortably in bed-denies any chest pain or shortness of breath.  Able to answer simple questions with yes/no.  Objective: Vitals: Blood pressure 135/66, pulse 75, temperature 98.4 F (36.9 C), temperature source Oral, resp. rate 16, height '5\' 5"'$  (1.651 m), weight 82 kg, SpO2 100 %.   Exam: Gen Exam:Alert awake-not in any distress HEENT:atraumatic, normocephalic Chest: B/L clear to auscultation anteriorly CVS:S1S2 regular Abdomen:soft non tender, non distended Extremities:no edema Neurology: Left-sided hemiplegia-moving right side but this also appears to be somewhat weak. Skin: no rash  Pertinent Labs/Radiology/Echo/EEG: 9/10 >>blood culture: No growth. 9/10 >>urine Culture: Insignificant growth  Stroke work-up: 8/3>> CT angio head: No intracranial large vessel occlusion 8/3>> CT angio neck: Minimal atherosclerotic change in the bulb internal carotid artery bulbs.  No  stenosis. 8/4>>5.9 8/4>> LDL: 75 8/4>> Echo: EF [id number], grade 1 diastolic dysfunction. 8/11>> CT head: Expected evolution of Rt MCA/PCA territory infarct w petechial hemorrhage/mild edema. 8/11>> MRI brain: Right MCA infarct-increased cytotoxic edema/petechial hemorrhage without midline shift. 8/16>> CT head: Evolving right posterior MCA territory infarct with increased cytotoxic edema.  Moderate mass-effect on the right lateral ventricle. 8/25>> MRI brain: Progressive swelling in the right hemisphere infarction-minimal petechial blood products.  Small acute infarctions of the inferior basal ganglia on the right.  8/12-10/2011>>LTM EEG: No seizures. 8/12:>>Spot EEG: No seizures  Assessment/Plan: Right MCA/PCA territory infarct with petechial hemorrhage/evolution-with mild edema/slight midline shift on repeat CT head on 8/16: Continues to have left-sided hemiparesis-PEG tube in place.  Work-up as above-on Pradaxa prior to this hospitalization-which was held due to petechial hemorrhage/severe dysphagia-he is now back on Eliquis.  Awaiting SNF bed.  Possible focal seizure: Continue Keppra/Topamax.  Headache: Stable on Topamax.  Probably related to CVA/cytotoxic edema.  Fever on 9/10: Resolved-cultures negative.  Completed 5-day course of cefepime.  HTN: BP stable-continue amlodipine/hydralazine/metoprolol.  HLD: Continue Lipitor  AKI on CKD stage IIIa: AKI likely hemodynamically mediated-resolved-creatinine back to baseline  HFpEF: Euvolemic on exam.  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  Nutrition Status: Nutrition Problem: Inadequate oral intake Etiology: dysphagia Signs/Symptoms: NPO status Interventions: Tube feeding, Prostat   Obesity: Estimated body mass index is 30.08 kg/m as calculated from the following:   Height as of this encounter: '5\' 5"'$  (1.651 m).   Weight as of this encounter: 82 kg.    Procedures : 8/30>>  G-tube placement by IR Consults: DVT Prophylaxis: Eliquis Code Status: DNR Family Communication: None at bedside  Time spent:  25 minutes-Greater than 50% of this time was spent in counseling, explanation of diagnosis, planning of further management, and coordination of care.  Diet: Diet Order             Diet NPO time specified  Diet effective now                   Disposition Plan: Status is: Inpatient  Remains inpatient appropriate because:Inpatient level of care appropriate due to severity of illness  Dispo: The patient is from: Home              Anticipated d/c is to: SNF              Patient currently is medically stable to d/c.   Difficult to place patient No    Barriers to [facility] Bed  Antimicrobial agents: Anti-infectives (From admission, onward)    Start     Dose/Rate Route Frequency Ordered Stop   11/14/20 1000  vancomycin (VANCOREADY) IVPB 750 mg/150 mL  Status:  Discontinued        750 mg 150 mL/hr over 60 Minutes Intravenous Every 24 hours 11/13/20 0859 11/14/20 1836   11/13/20 1000  ceFEPIme (MAXIPIME) 2 g in sodium chloride 0.9 % 100 mL IVPB        2 g 200 mL/hr over 30 Minutes Intravenous Every 12 hours 11/13/20 0859 11/17/20 2203   11/13/20 0945  vancomycin (VANCOREADY) IVPB 1250 mg/250 mL        1,250 mg 166.7 mL/hr over 90 Minutes Intravenous  Once 11/13/20 0859 11/13/20 1441   11/02/20 0000  ceFAZolin (ANCEF) IVPB 2g/100 mL premix        2 g 200 mL/hr over 30 Minutes Intravenous To Radiology 11/01/20 1054 11/02/20 1040        MEDICATIONS: Scheduled Meds:  amLODipine  10 mg Per Tube Daily   apixaban  5 mg Per Tube BID   atorvastatin  40 mg Per Tube Daily   erythromycin   Right Eye TID   feeding supplement (PROSource TF)  45 mL Per Tube BID   free water  200 mL Per Tube Q4H   hydrALAZINE  25 mg Per Tube QID   insulin aspart  0-9 Units Subcutaneous Q4H   levETIRAcetam  250 mg Per Tube BID   mouth rinse  15 mL Mouth  Rinse BID   metoprolol tartrate  12.5 mg Per Tube BID   modafinil  100 mg Per Tube Daily   nystatin  5 mL Mouth/Throat QID   pantoprazole sodium  40 mg Per Tube Daily   saccharomyces boulardii  250 mg Per Tube BID   topiramate  50 mg Per Tube BID   Continuous Infusions:  feeding supplement (JEVITY 1.5 CAL/FIBER) 1,000 mL (11/21/20 1918)   PRN Meds:.acetaminophen **OR** acetaminophen (TYLENOL) oral liquid 160 mg/5 mL **OR** acetaminophen, guaiFENesin, labetalol, lip balm, ondansetron (ZOFRAN) IV, polyethylene glycol   I have personally reviewed following labs and imaging studies  LABORATORY DATA: CBC: Recent Labs  Lab 11/16/20 1017 11/18/20 0339 11/19/20 0342  WBC 6.4 6.7 7.6  HGB 11.1* 11.1* 10.4*  HCT 34.8* 35.5* 33.4*  MCV 81.9 82.8 82.9  PLT 91* 103* 111*    Basic Metabolic Panel: Recent Labs  Lab 11/16/20 1017 11/18/20 0339 11/19/20 0342  NA 142 142 141  K 3.5 4.1 4.0  CL 115* 113* 113*  CO2 19* 20* 21*  GLUCOSE 136* 121* 109*  BUN 32* 36* 39*  CREATININE 1.28* 1.39*  1.51*  CALCIUM 8.4* 8.6* 8.8*  MG  --  2.0  --   PHOS  --  3.5  --     GFR: Estimated Creatinine Clearance: 43.6 mL/min (A) (by C-G formula based on SCr of 1.51 mg/dL (H)).  Liver Function Tests: No results for input(s): AST, ALT, ALKPHOS, BILITOT, PROT, ALBUMIN in the last 168 hours. No results for input(s): LIPASE, AMYLASE in the last 168 hours. No results for input(s): AMMONIA in the last 168 hours.  Coagulation Profile: No results for input(s): INR, PROTIME in the last 168 hours.  Cardiac Enzymes: No results for input(s): CKTOTAL, CKMB, CKMBINDEX, TROPONINI in the last 168 hours.  BNP (last 3 results) No results for input(s): PROBNP in the last 8760 hours.  Lipid Profile: No results for input(s): CHOL, HDL, LDLCALC, TRIG, CHOLHDL, LDLDIRECT in the last 72 hours.  Thyroid Function Tests: No results for input(s): TSH, T4TOTAL, FREET4, T3FREE, THYROIDAB in the last 72  hours.  Anemia Panel: No results for input(s): VITAMINB12, FOLATE, FERRITIN, TIBC, IRON, RETICCTPCT in the last 72 hours.  Urine analysis:    Component Value Date/Time   COLORURINE YELLOW 11/13/2020 0824   APPEARANCEUR CLEAR 11/13/2020 0824   LABSPEC 1.015 11/13/2020 0824   PHURINE 6.5 11/13/2020 0824   GLUCOSEU NEGATIVE 11/13/2020 0824   HGBUR NEGATIVE 11/13/2020 0824   BILIRUBINUR NEGATIVE 11/13/2020 0824   KETONESUR NEGATIVE 11/13/2020 0824   PROTEINUR NEGATIVE 11/13/2020 0824   NITRITE NEGATIVE 11/13/2020 0824   LEUKOCYTESUR NEGATIVE 11/13/2020 0824    Sepsis Labs: Lactic Acid, Venous No results found for: LATICACIDVEN  MICROBIOLOGY: Recent Results (from the past 240 hour(s))  Urine Culture     Status: Abnormal   Collection Time: 11/13/20  8:24 AM   Specimen: Urine, Clean Catch  Result Value Ref Range Status   Specimen Description URINE, CLEAN CATCH  Final   Special Requests NONE  Final   Culture (A)  Final    <10,000 COLONIES/mL INSIGNIFICANT GROWTH Performed at [facility], [address], [place], [place] [address]    Report Status 11/14/2020 FINAL  Final  MRSA Next Gen by PCR, Nasal     Status: None   Collection Time: 11/13/20  8:25 AM   Specimen: Nasal Mucosa; Nasal Swab  Result Value Ref Range Status   MRSA by PCR Next Gen NOT DETECTED NOT DETECTED Final    Comment: (NOTE) The GeneXpert MRSA Assay (FDA approved for NASAL specimens only), is one component of a comprehensive MRSA colonization surveillance program. It is not intended to diagnose MRSA infection nor to guide or monitor treatment for MRSA infections. Test performance is not FDA approved in patients less than 93 years old. Performed at [facility], [place] [address], [place], [place] [address]   Culture, blood (routine x 2)     Status: None   Collection Time: 11/13/20  9:02 AM   Specimen: BLOOD  Result Value Ref Range Status   Specimen Description BLOOD RIGHT ANTECUBITAL  Final    Special Requests   Final    BOTTLES DRAWN AEROBIC ONLY Blood Culture adequate volume   Culture   Final    NO GROWTH 5 DAYS Performed at [facility], [place] [address], [place], [place] [address]    Report Status 11/18/2020 FINAL  Final  Culture, blood (routine x 2)     Status: None   Collection Time: 11/13/20  9:08 AM   Specimen: BLOOD  Result Value Ref Range Status  Specimen Description BLOOD LEFT ANTECUBITAL  Final   Special Requests   Final    BOTTLES DRAWN AEROBIC AND ANAEROBIC Blood Culture adequate volume   Culture   Final    NO GROWTH 5 DAYS Performed at [facility], [address], [place], [place] [address]    Report Status 11/18/2020 FINAL  Final  Resp Panel by RT-PCR (Flu A&B, Covid) Nasopharyngeal Swab     Status: None   Collection Time: 11/13/20  5:07 PM   Specimen: Nasopharyngeal Swab; Nasopharyngeal(NP) swabs in vial transport medium  Result Value Ref Range Status   SARS Coronavirus 2 by RT PCR NEGATIVE NEGATIVE Final    Comment: (NOTE) SARS-CoV-2 target nucleic acids are NOT DETECTED.  The SARS-CoV-2 RNA is generally detectable in upper respiratory specimens during the acute phase of infection. The lowest concentration of SARS-CoV-2 viral copies this assay can detect is 138 copies/mL. A negative result does not preclude SARS-Cov-2 infection and should not be used as the sole basis for treatment or other patient management decisions. A negative result may occur with  improper specimen collection/handling, submission of specimen other than nasopharyngeal swab, presence of viral mutation(s) within the areas targeted by this assay, and inadequate number of viral copies(<138 copies/mL). A negative result must be combined with clinical observations, patient history, and epidemiological information. The expected result is Negative.  Fact Sheet for Patients:  EntrepreneurPulse.com.au  Fact Sheet for Healthcare Providers:   IncredibleEmployment.be  This test is no t yet approved or cleared by the [place] FDA and  has been authorized for detection and/or diagnosis of SARS-CoV-2 by FDA under an Emergency Use Authorization (EUA). This EUA will remain  in effect (meaning this test can be used) for the duration of the COVID-19 declaration under Section 564(b)(1) of the Act, 21 U.S.C.section 360bbb-3(b)(1), unless the authorization is terminated  or revoked sooner.       Influenza A by PCR NEGATIVE NEGATIVE Final   Influenza B by PCR NEGATIVE NEGATIVE Final    Comment: (NOTE) The Xpert Xpress SARS-CoV-2/FLU/RSV plus assay is intended as an aid in the diagnosis of influenza from Nasopharyngeal swab specimens and should not be used as a sole basis for treatment. Nasal washings and aspirates are unacceptable for Xpert Xpress SARS-CoV-2/FLU/RSV testing.  Fact Sheet for Patients: EntrepreneurPulse.com.au  Fact Sheet for Healthcare Providers: IncredibleEmployment.be  This test is not yet approved or cleared by the [place] FDA and has been authorized for detection and/or diagnosis of SARS-CoV-2 by FDA under an Emergency Use Authorization (EUA). This EUA will remain in effect (meaning this test can be used) for the duration of the COVID-19 declaration under Section 564(b)(1) of the Act, 21 U.S.C. section 360bbb-3(b)(1), unless the authorization is terminated or revoked.  Performed at [facility], [place] [address] [place], [place] [address]     RADIOLOGY STUDIES/RESULTS: DG CHEST PORT 1 VIEW  Result Date: 11/21/2020 CLINICAL DATA:  Cough. EXAM: PORTABLE CHEST 1 VIEW COMPARISON:  November 13, 2020 FINDINGS: The heart size and mediastinal contours are within normal limits. Both lungs are clear. The visualized skeletal structures are unremarkable. IMPRESSION: No active disease. Electronically Signed   By: Dorise Bullion III M.D.   On:  11/21/2020 15:58     LOS: 39 days   Oren Binet, MD  [facility]    To contact the attending provider between 7A-7P or the covering provider during after hours 7P-7A, please log into the web site [email or web address] and access using universal  [place] password for that web site. If you do not have the password, please call the hospital operator.  11/22/2020, 9:11 AM

## 2020-11-22 NOTE — Progress Notes (Signed)
Modified Barium Swallow Progress Note  Patient Details  Name: Clifford [name] MRN: [id number] Date of Birth: March 04, 1951  Today's Date: 11/22/2020  Modified Barium Swallow completed.  Full report located under Chart Review in the Imaging Section.  Brief recommendations include the following:  Clinical Impression Pt continues to present with significant cognitive deficits, which adversely impact the functionality of PO intake. Pt accepted trials of nectar thick liquid, thin liquid, and puree via teaspoon and straw (liquids).   Pt presents with moderate oral dysphagia, characterized by left anterior leakage, extended oral holding, poor bolus formation and posterior propulsion. Trace residue is noted throughout the oral cavity after the swallow. Pt required multiple cues to propel bolus posteriorly to be swallowed.   Pharyngeal swallow is mildly impaired, and is characterized by premature spillage of all consistencies over the tongue base to the vallecular and then pyriform sinuses prior to initiation of the swallow reflex. No penetration or aspiration was seen on any texture, and no post-swallow residue was noted on any consistency.   While oropharyngeal swallow is safe for PO intake, his cognitive impairment makes safe, effective and functional PO intake unlikely at this point.  Recommend continuing PEG tube feeds for primary nutrition, hydration, and medications. Pt may have individual small boluses of ice chips, water, and puree during therapy and with close 1:1 supervision for pleasure. SLP will continue to follow acutely to assess tolerance of PO trials, and determine readiness to advance.    Swallow Evaluation Recommendations  SLP Diet Recommendations: Alternative means - long-term      Small individual boluses of ice chips, thin liquid, puree given with 1:1 close supervision   Medication Administration: Via alternative means   Compensations: Minimize environmental distractions;Slow  rate;Small sips/bites;Monitor for anterior loss   Postural Changes: Seated upright at 90 degrees   Oral Care Recommendations: Oral care before and after PO   Other Recommendations: Have oral suction available   Clifford [name] B. Quentin Ore, [facility], [place] Speech Language Pathologist Office: [phone]  Clifford [name] 11/22/2020,9:50 AM

## 2020-11-23 DIAGNOSIS — R531 Weakness: Secondary | ICD-10-CM | POA: Diagnosis not present

## 2020-11-23 DIAGNOSIS — I639 Cerebral infarction, unspecified: Secondary | ICD-10-CM | POA: Diagnosis not present

## 2020-11-23 LAB — GLUCOSE, CAPILLARY
Glucose-Capillary: 101 mg/dL — ABNORMAL HIGH (ref 70–99)
Glucose-Capillary: 117 mg/dL — ABNORMAL HIGH (ref 70–99)
Glucose-Capillary: 117 mg/dL — ABNORMAL HIGH (ref 70–99)
Glucose-Capillary: 124 mg/dL — ABNORMAL HIGH (ref 70–99)
Glucose-Capillary: 133 mg/dL — ABNORMAL HIGH (ref 70–99)
Glucose-Capillary: 137 mg/dL — ABNORMAL HIGH (ref 70–99)
Glucose-Capillary: 141 mg/dL — ABNORMAL HIGH (ref 70–99)

## 2020-11-23 NOTE — TOC Progression Note (Signed)
Transition of Care [facility]) - Progression Note    Patient Details  Name: Clifford [name] MRN: [id number] Date of Birth: Jun 21, 1950  Transition of Care [facility]) CM/SW [place], [place] Phone Number: 11/23/2020, 11:04 AM  Clinical Narrative:   CSW followed up with financial counselor today to check on status of pending Medicaid number. Pending Medicaid number has still not been received at this time, but she will keep checking. CSW also spoke with brother about barrier to discharge. Brother was appreciative of update. CSW to continue to follow.    Expected Discharge Plan: Skilled Nursing Facility Barriers to Discharge: [place] (PASRR), Continued Medical Work up, Ship broker, Inadequate or no Biomedical scientist Expected Discharge Plan: [place] Choice: [place] arrangements for the past 2 months: Single Family Home                                       Social Determinants of Health (SDOH) Interventions    Readmission Risk Interventions No flowsheet data found.

## 2020-11-23 NOTE — Progress Notes (Addendum)
Occupational Therapy Treatment Patient Details Name: Clifford [name] MRN: [id number] DOB: [birth year] Today's Date: 11/23/2020   History of present illness 70 yo male presented to ED on 8/11 for R sided headache and worsening L UE weakness. Patient recently admitted 8/3-8/5 for R CVA with L visual deficits and L sided weakness. MRI with mild enlargement of R MCA/border zone infarct since 10/07/20 MRI and increased cytotoxic edema and petechial hemorrhage without midline shift. Palliative Care consulted and in detailed family discussions, with prognosis provided by Neurology (more likely able to regain ability to talk and participate in self cares, unlikely to regain ability to walk, uncertain if able to regain ability to swallow, very unlikely to improve to full independence), it was decided to place PEG and pursue SNF rehab, PEG was placed 8/30. 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   OT comments  Clifford [name] continues to be limited by the limitations and deficits listed below with limited functional progress made towards acute OT goals. Session completed at bed level for safety with only +1 assist. Clifford [name] in rolling to re-position in bed with use of RUE/LE given max vcs and physical assist. He tolerated PROM of LUE however verbalized pain with extension of his L elbow; increased flexor tone noted. Clifford [name] inconsistently followed simple commands throughout the session with significantly increased time. Pt was unable to locate or attend to any stimulus in his L visual field despite max multimodal cues for compensatory techniques. He required significant assist to use RUE as a functional assist during grooming tasks at bed level. Pt continued to benefit from OT acutely. D/c plan remains appropriate.    Recommendations for follow up therapy are one component of a multi-disciplinary discharge planning process, led by the attending physician.  Recommendations may be updated based on patient status,  additional functional criteria and insurance authorization.    Follow Up Recommendations  SNF    Equipment Recommendations  None recommended by OT       Precautions / Restrictions Precautions Precautions: Fall Precaution Comments: L visual field deficit, R gaze preference, L inattention, L hemparesis; G-tube and abdominal binder; penile skin breakdown from long term condom cath use/moisture Restrictions Weight Bearing Restrictions: No       Mobility Bed Mobility Overal bed mobility: Needs Assistance Bed Mobility: Rolling Rolling: Max assist         General bed mobility comments: Limited to rolling this session and re-positioning due to no +2 assist. Pt was a very heavy max A to roll towards the L&R sides, he was able to assist with placement of RUE    Transfers Overall transfer level: Needs assistance               General transfer comment: no attempted    Balance Overall balance assessment: Needs assistance                                         ADL either performed or assessed with clinical judgement   ADL Overall ADL's : Needs assistance/impaired     Grooming: Moderate assistance;Wash/dry hands;Wash/dry face Grooming Details (indicate cue type and reason): moderate verbal cues to cross midline to wash L side of face with washcloth. Mod A to involveL hand in grooming task. Pt did not attend to L hand despite maximal cueing  Functional mobility during ADLs:  (limited to bed mobility this session) General ADL Comments: [name] pt with bed level grooming, re-positioning and ROM. Pt required significant assist to involve LUE; pt perseverated on face washing task and AAROM of R hand however attempted to grab therapists figured due to visual neglect of L hand     Vision   Vision Assessment?: Yes Alignment/Gaze Preference: Head turned;Gaze right Visual Fields: Left homonymous hemianopsia   Perception      Praxis      Cognition Arousal/Alertness: Awake/alert Behavior During Therapy: Flat affect Overall Cognitive Status: No family/caregiver present to determine baseline cognitive functioning Area of Impairment: Orientation;Attention;Following commands;Safety/judgement                 Orientation Level: Disoriented to;Place;Time;Situation Current Attention Level: Sustained   Following Commands: Follows one step commands inconsistently     Problem Solving: Slow processing;Decreased initiation;Requires verbal cues;Requires tactile cues;Difficulty sequencing General Comments: pt follows one step commands about 25% of the time, requried visual and verbal cues; does not attend to anything on his L side. he required significantly incrased time for processing and initiation        Exercises Other Exercises Other Exercises: LUE PROM of hand, wrist, elbow and shoulder 10x supine Other Exercises: Neck PROM 5x supine   Shoulder Instructions       General Comments VSS throughout session, pt with dry skin, penial skin break down, drooling out of L side of mouth    Pertinent Vitals/ Pain       Pain Assessment: Faces Faces Pain Scale: Hurts a little bit Pain Location: LUE with PROM Pain Descriptors / Indicators: Grimacing Pain Intervention(s): Limited activity within patient's tolerance;Monitored during session;Repositioned   Frequency  Min 2X/week        Progress Toward Goals  OT Goals(current goals can now be found in the care plan section)  Progress towards OT goals: Progressing toward goals  Acute Rehab OT Goals Patient Stated Goal: None stated OT Goal Formulation: Patient unable to participate in goal setting Time For Goal Achievement: 12/09/20 Potential to Achieve Goals: Fair ADL Goals Pt Will Perform Grooming: with mod assist;sitting;bed level Pt Will Perform Lower Body Dressing: with mod assist;sitting/lateral leans Pt Will Transfer to Toilet: with mod  assist;squat pivot transfer Pt Will Perform Toileting - Clothing Manipulation and hygiene: with mod assist;bed level Additional ADL Goal #1: Pt will locate three grooming items in the left visual field wtih mod cues  Plan Discharge plan remains appropriate    Co-evaluation                 AM-PAC OT "6 Clicks" Daily Activity     Outcome Measure   Help from another person eating meals?: Total Help from another person taking care of personal grooming?: A Lot Help from another person toileting, which includes using toliet, bedpan, or urinal?: Total Help from another person bathing (including washing, rinsing, drying)?: Total Help from another person to put on and taking off regular upper body clothing?: A Lot Help from another person to put on and taking off regular lower body clothing?: Total 6 Click Score: 8    End of Session    OT Visit Diagnosis: Unsteadiness on feet (R26.81);Muscle weakness (generalized) (M62.81);Other symptoms and signs involving cognitive function   Activity Tolerance Patient tolerated treatment well   Patient Left in bed;with call Ramnath/phone within reach;with bed alarm set   Nurse Communication Mobility status        Time: 3220-2542 OT  Time Calculation (min): 18 min  Charges: OT General Charges $OT Visit: 1 Visit OT Treatments $Therapeutic Activity: 8-22 mins    Clifford [name] 11/23/2020, 4:12 PM

## 2020-11-23 NOTE — Progress Notes (Signed)
PROGRESS NOTE        PATIENT DETAILS Name: Clifford [name] Age: 70 y.o. Sex: male Date of Birth: 07/02/1950 Admit Date: 10/14/2020 Admitting Physician Vernelle Emerald, MD PCP:Pcp, No  Brief Narrative: Patient is a 70 y.o. 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  He was managed with supportive care-hospital course was complicated by worsening somnolence-further imaging studies showed cytotoxic edema with some mass-effect on the right lateral ventricle.  Neurology continued to follow closely-he was managed with continued supportive care.  He continued to exhibit significant left-sided hemiplegia/dysphagia-ultimately required G-tube placement.  Plans are for SNF on discharge.  Subjective: Able to answer simple questions.  Continues to have significant left-sided deficits.  Objective: Vitals: Blood pressure 131/71, pulse 76, temperature 98.5 F (36.9 C), temperature source Oral, resp. rate (!) 21, height 5\' 5"  (1.651 m), weight 82.1 kg, SpO2 100 %.   Exam: Gen Exam: Not in any distress. HEENT:atraumatic, normocephalic Chest: B/L clear to auscultation anteriorly CVS:S1S2 regular Abdomen:soft non tender, non distended Extremities:no edema Neurology: Left-sided hemiplegia Skin: no rash   Pertinent Labs/Radiology/Echo/EEG: 9/10 >>blood culture: No growth. 9/10 >>urine Culture: Insignificant growth  Stroke work-up: 8/3>> CT angio head: No intracranial large vessel occlusion 8/3>> CT angio neck: Minimal atherosclerotic change in the bulb internal carotid artery bulbs.  No stenosis. 8/4>>5.9 8/4>> LDL: 75 8/4>> Echo: EF 94-17%, grade 1 diastolic dysfunction. 8/11>> CT  head: Expected evolution of Rt MCA/PCA territory infarct w petechial hemorrhage/mild edema. 8/11>> MRI brain: Right MCA infarct-increased cytotoxic edema/petechial hemorrhage without midline shift. 8/16>> CT head: Evolving right posterior MCA territory infarct with increased cytotoxic edema.  Moderate mass-effect on the right lateral ventricle. 8/25>> MRI brain: Progressive swelling in the right hemisphere infarction-minimal petechial blood products.  Small acute infarctions of the inferior basal ganglia on the right.  8/12-10/2011>>LTM EEG: No seizures. 8/12:>>Spot EEG: No seizures  Assessment/Plan: Right MCA/PCA territory infarct with petechial hemorrhage/evolution-with mild edema/slight midline shift on repeat CT head on 8/16: Continues to have left-sided hemiparesis-PEG tube in place.  Work-up as above-on Pradaxa prior to this hospitalization-which was held due to petechial hemorrhage/severe dysphagia-he is now back on Eliquis.  Awaiting SNF bed.  Possible focal seizure: Continue Keppra/Topamax.  Headache: Stable on Topamax.  Probably related to CVA/cytotoxic edema.  Fever on 9/10: Resolved-cultures negative.  Completed 5-day course of cefepime.  HTN: BP stable-continue amlodipine/hydralazine/metoprolol.  HLD: Continue Lipitor  AKI on CKD stage IIIa: AKI likely hemodynamically mediated-resolved-creatinine back to baseline  HFpEF: Euvolemic on exam.  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  Nutrition Status: Nutrition Problem: Inadequate oral intake Etiology: dysphagia Signs/Symptoms: NPO status Interventions: Tube feeding, Prostat   Obesity: Estimated body mass index is 30.12 kg/m as calculated from the following:   Height as of this encounter: 5\' 5"  (1.651 m).   Weight as of this encounter: 82.1 kg.    Procedures : 8/30>> G-tube placement by IR Consults: DVT Prophylaxis: Eliquis Code Status: DNR Family Communication:  None at bedside  Time spent: 15 minutes-Greater than 50% of this time was spent in counseling, explanation of diagnosis, planning  of further management, and coordination of care.  Diet: Diet Order             Diet NPO time specified  Diet effective now                   Disposition Plan: Status is: Inpatient  Remains inpatient appropriate because:Inpatient level of care appropriate due to severity of illness  Dispo: The patient is from: Home              Anticipated d/c is to: SNF              Patient currently is medically stable to d/c.   Difficult to place patient No    Barriers to [facility] Bed  Antimicrobial agents: Anti-infectives (From admission, onward)    Start     Dose/Rate Route Frequency Ordered Stop   11/14/20 1000  vancomycin (VANCOREADY) IVPB 750 mg/150 mL  Status:  Discontinued        750 mg 150 mL/hr over 60 Minutes Intravenous Every 24 hours 11/13/20 0859 11/14/20 1836   11/13/20 1000  ceFEPIme (MAXIPIME) 2 g in sodium chloride 0.9 % 100 mL IVPB        2 g 200 mL/hr over 30 Minutes Intravenous Every 12 hours 11/13/20 0859 11/17/20 2203   11/13/20 0945  vancomycin (VANCOREADY) IVPB 1250 mg/250 mL        1,250 mg 166.7 mL/hr over 90 Minutes Intravenous  Once 11/13/20 0859 11/13/20 1441   11/02/20 0000  ceFAZolin (ANCEF) IVPB 2g/100 mL premix        2 g 200 mL/hr over 30 Minutes Intravenous To Radiology 11/01/20 1054 11/02/20 1040        MEDICATIONS: Scheduled Meds:  amLODipine  10 mg Per Tube Daily   apixaban  5 mg Per Tube BID   atorvastatin  40 mg Per Tube Daily   erythromycin   Right Eye TID   feeding supplement (PROSource TF)  45 mL Per Tube BID   free water  200 mL Per Tube Q4H   hydrALAZINE  25 mg Per Tube QID   insulin aspart  0-9 Units Subcutaneous Q4H   levETIRAcetam  250 mg Per Tube BID   mouth rinse  15 mL Mouth Rinse BID   metoprolol tartrate  12.5 mg Per Tube BID   modafinil  100 mg Per Tube Daily   nystatin  5  mL Mouth/Throat QID   pantoprazole sodium  40 mg Per Tube Daily   saccharomyces boulardii  250 mg Per Tube BID   topiramate  50 mg Per Tube BID   Continuous Infusions:  feeding supplement (JEVITY 1.5 CAL/FIBER) 1,000 mL (11/21/20 1918)   PRN Meds:.acetaminophen **OR** acetaminophen (TYLENOL) oral liquid 160 mg/5 mL **OR** acetaminophen, guaiFENesin, labetalol, lip balm, ondansetron (ZOFRAN) IV, polyethylene glycol   I have personally reviewed following labs and imaging studies  LABORATORY DATA: CBC: Recent Labs  Lab 11/18/20 0339 11/19/20 0342  WBC 6.7 7.6  HGB 11.1* 10.4*  HCT 35.5* 33.4*  MCV 82.8 82.9  PLT 103* 111*     Basic Metabolic Panel: Recent Labs  Lab 11/18/20 0339 11/19/20 0342  NA 142 141  K 4.1 4.0  CL 113* 113*  CO2 20* 21*  GLUCOSE 121* 109*  BUN 36* 39*  CREATININE 1.39* 1.51*  CALCIUM 8.6* 8.8*  MG 2.0  --   PHOS 3.5  --      GFR: Estimated Creatinine Clearance: 43.6 mL/min (A) (by C-G  formula based on SCr of 1.51 mg/dL (H)).  Liver Function Tests: No results for input(s): AST, ALT, ALKPHOS, BILITOT, PROT, ALBUMIN in the last 168 hours. No results for input(s): LIPASE, AMYLASE in the last 168 hours. No results for input(s): AMMONIA in the last 168 hours.  Coagulation Profile: No results for input(s): INR, PROTIME in the last 168 hours.  Cardiac Enzymes: No results for input(s): CKTOTAL, CKMB, CKMBINDEX, TROPONINI in the last 168 hours.  BNP (last 3 results) No results for input(s): PROBNP in the last 8760 hours.  Lipid Profile: No results for input(s): CHOL, HDL, LDLCALC, TRIG, CHOLHDL, LDLDIRECT in the last 72 hours.  Thyroid Function Tests: No results for input(s): TSH, T4TOTAL, FREET4, T3FREE, THYROIDAB in the last 72 hours.  Anemia Panel: No results for input(s): VITAMINB12, FOLATE, FERRITIN, TIBC, IRON, RETICCTPCT in the last 72 hours.  Urine analysis:    Component Value Date/Time   COLORURINE YELLOW 11/13/2020 0824    APPEARANCEUR CLEAR 11/13/2020 0824   LABSPEC 1.015 11/13/2020 0824   PHURINE 6.5 11/13/2020 0824   GLUCOSEU NEGATIVE 11/13/2020 0824   HGBUR NEGATIVE 11/13/2020 0824   BILIRUBINUR NEGATIVE 11/13/2020 0824   KETONESUR NEGATIVE 11/13/2020 0824   PROTEINUR NEGATIVE 11/13/2020 0824   NITRITE NEGATIVE 11/13/2020 0824   LEUKOCYTESUR NEGATIVE 11/13/2020 0824    Sepsis Labs: Lactic Acid, Venous No results found for: LATICACIDVEN  MICROBIOLOGY: Recent Results (from the past 240 hour(s))  Resp Panel by RT-PCR (Flu A&B, Covid) Nasopharyngeal Swab     Status: None   Collection Time: 11/13/20  5:07 PM   Specimen: Nasopharyngeal Swab; Nasopharyngeal(NP) swabs in vial transport medium  Result Value Ref Range Status   SARS Coronavirus 2 by RT PCR NEGATIVE NEGATIVE Final    Comment: (NOTE) SARS-CoV-2 target nucleic acids are NOT DETECTED.  The SARS-CoV-2 RNA is generally detectable in upper respiratory specimens during the acute phase of infection. The lowest concentration of SARS-CoV-2 viral copies this assay can detect is 138 copies/mL. A negative result does not preclude SARS-Cov-2 infection and should not be used as the sole basis for treatment or other patient management decisions. A negative result may occur with  improper specimen collection/handling, submission of specimen other than nasopharyngeal swab, presence of viral mutation(s) within the areas targeted by this assay, and inadequate number of viral copies(<138 copies/mL). A negative result must be combined with clinical observations, patient history, and epidemiological information. The expected result is Negative.  Fact Sheet for Patients:  EntrepreneurPulse.com.au  Fact Sheet for Healthcare Providers:  IncredibleEmployment.be  This test is no t yet approved or cleared by the [place] FDA and  has been authorized for detection and/or diagnosis of SARS-CoV-2 by FDA under an Emergency  Use Authorization (EUA). This EUA will remain  in effect (meaning this test can be used) for the duration of the COVID-19 declaration under Section 564(b)(1) of the Act, 21 U.S.C.section 360bbb-3(b)(1), unless the authorization is terminated  or revoked sooner.       Influenza A by PCR NEGATIVE NEGATIVE Final   Influenza B by PCR NEGATIVE NEGATIVE Final    Comment: (NOTE) The Xpert Xpress SARS-CoV-2/FLU/RSV plus assay is intended as an aid in the diagnosis of influenza from Nasopharyngeal swab specimens and should not be used as a sole basis for treatment. Nasal washings and aspirates are unacceptable for Xpert Xpress SARS-CoV-2/FLU/RSV testing.  Fact Sheet for Patients: EntrepreneurPulse.com.au  Fact Sheet for Healthcare Providers: IncredibleEmployment.be  This test is not yet approved or cleared by the [place]  States FDA and has been authorized for detection and/or diagnosis of SARS-CoV-2 by FDA under an Emergency Use Authorization (EUA). This EUA will remain in effect (meaning this test can be used) for the duration of the COVID-19 declaration under Section 564(b)(1) of the Act, 21 U.S.C. section 360bbb-3(b)(1), unless the authorization is terminated or revoked.  Performed at [facility], [place] [address], [place], [place] [address]     RADIOLOGY STUDIES/RESULTS: DG CHEST PORT 1 VIEW  Result Date: 11/21/2020 CLINICAL DATA:  Cough. EXAM: PORTABLE CHEST 1 VIEW COMPARISON:  November 13, 2020 FINDINGS: The heart size and mediastinal contours are within normal limits. Both lungs are clear. The visualized skeletal structures are unremarkable. IMPRESSION: No active disease. Electronically Signed   By: Dorise Bullion III M.D.   On: 11/21/2020 15:58   DG Swallowing Func-Speech Pathology  Result Date: 11/22/2020 Table formatting from the original result was not included. Objective Swallowing Evaluation: Type of Study: MBS-Modified Barium  Swallow Study  Patient Details Name: Clifford [name] MRN: [id number] Date of Birth: Aug 25, 1950 Today's Date: 11/22/2020 Time: SLP Start Time (ACUTE ONLY): 0910 -SLP Stop Time (ACUTE ONLY): 0930 SLP Time Calculation (min) (ACUTE ONLY): 20 min Past Medical History: Past Medical History: Diagnosis Date  BPH (benign prostatic hyperplasia)   Chronic anticoagulation 10/14/2020  Essential hypertension 10/06/2020  History of pulmonary embolism 10/14/2020  HLD (hyperlipidemia)   HTN (hypertension)   Lung nodule   49mm in 2015  Malignant neoplasm of left kidney ([place])   Mixed hyperlipidemia 10/06/2020  Nicotine dependence, cigarettes, uncomplicated 1/54/0086  Peripheral vascular disease ([place])   Polyp of colon   Prediabetes 10/14/2020  Pulmonary embolus ([place])   Stroke ([place]) 10/06/2020 Past Surgical History: Past Surgical History: Procedure Laterality Date  COLONOSCOPY  03/2017  IR GASTROSTOMY TUBE MOD SED  11/02/2020  PARTIAL NEPHRECTOMY Left 02/2018  PROSTATE BIOPSY  2015 HPI: 70yo male admitted 10/14/20 with right side headache and LUE weakness. 10/19/20 pt was noted to have increased LUE weakness. 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 CTHead = evolving RMCA infarct with cytotoxic edema, mass effect  Subjective: Pt seen in radiology for MBS. Left anterior leakage of oral secretions observed prior to PO presentations. Assessment / Plan / Recommendation CHL IP CLINICAL IMPRESSIONS 11/22/2020 Clinical Impression Pt continues to present with significant cognitive deficits, which adversely impact the functionality of PO intake. Pt accepted trials of nectar thick liquid, thin liquid, and puree via teaspoon and straw (liquids). Pt presents with moderate oral dysphagia, characterized by left anterior leakage, extended oral holding, poor bolus formation and posterior propulsion. Trace residue is noted throughout the oral cavity after the swallow. Pt required multiple cues to  propel bolus posteriorly to be swallowed. Pharyngeal swallow is mildly impaired, and is characterized by premature spillage of all consistencies over the tongue base to the vallecular and then pyriform sinuses prior to initiation of the swallow reflex. No penetration or aspiration was seen on any texture, and no post-swallow residue was noted on any consistency. While oropharyngeal swallow is safe for PO intake, his cognitive impairment makes safe, effective and functional PO intake unlikely at this point. Recommend continuing PEG tube feeds for primary nutrition, hydration, and medications. Pt may have individual small boluses of ice chips, water, and puree during therapy and with close 1:1 supervision for pleasure. SLP will continue to follow acutely to assess tolerance of PO trials, and determine readiness to advance.  SLP Visit Diagnosis Dysphagia, oropharyngeal phase (R13.12)     Impact on safety and function Risk for inadequate nutrition/hydration;Moderate aspiration risk;Mild aspiration risk   CHL IP TREATMENT RECOMMENDATION 11/22/2020 Treatment Recommendations Therapy as outlined in treatment plan below   Prognosis 11/22/2020 Prognosis for Safe Diet Advancement Fair Barriers to Reach Goals Cognitive deficits;Severity of deficits CHL IP DIET RECOMMENDATION 11/22/2020 SLP Diet Recommendations Alternative means - long-term  Other PO intake: Individual boluses of ice chips, thin liquid, and puree, given with 1:1 close supervision  Medication Administration Via alternative means Compensations Minimize environmental distractions;Slow rate;Small sips/bites;Monitor for anterior loss Postural Changes Seated upright at 90 degrees   CHL IP OTHER RECOMMENDATIONS 11/22/2020   Oral Care Recommendations Oral care before and after PO  Other Recommendations Have oral suction available   CHL IP FOLLOW UP RECOMMENDATIONS 11/22/2020 Follow up Recommendations Skilled Nursing facility;24 hour supervision/assistance   CHL IP FREQUENCY  AND DURATION 11/22/2020 Speech Therapy Frequency (ACUTE ONLY) min 2x/week Treatment Duration 2 weeks      CHL IP ORAL PHASE 11/22/2020 Oral Phase Impaired  Oral - Nectar Teaspoon, Nectar cup, Thin Straw, Puree Left anterior bolus loss Weak lingual manipulation Lingual pumping Reduced posterior propulsion Holding of bolus Pocketing in anterior sulcus Lingual/palatal residue Delayed oral transit Premature spillage Decreased bolus cohesion   CHL IP PHARYNGEAL PHASE 11/22/2020 Pharyngeal Phase Impaired  Pharyngeal- Nectar Teaspoon Delayed swallow initiation-pyriform sinuses   CHL IP CERVICAL ESOPHAGEAL PHASE 11/22/2020 Cervical Esophageal Phase WFL Celia B. Quentin Ore, [facility], [place] Speech Language Pathologist Office: [phone] Shonna Chock 11/22/2020, 9:45 AM                LOS: 40 days   Oren Binet, MD  [facility]    To contact the attending provider between 7A-7P or the covering provider during after hours 7P-7A, please log into the web site [email or web address] and access using universal [email or web address] password for that web site. If you do not have the password, please call the hospital operator.  11/23/2020, 2:35 PM

## 2020-11-23 NOTE — Progress Notes (Signed)
Speech Language Pathology Treatment: Dysphagia  Patient Details Name: Clifford [name] MRN: [id number] DOB: 11/28/1950 Today's Date: 11/23/2020 Time: 1410-1435 SLP Time Calculation (min) (ACUTE ONLY): 25 min  Assessment / Plan / Recommendation Clinical Impression  Pt seen for follow up after MBS completed yesterday. Pt was awake and alert, asking who the lady behind him is (no one was there). Oral care was completed with suction, which pt tolerated well. He then accepted trials of thin liquid and ice cream without overt s/s aspiration. Left pocketing and anterior leakage noted on both textures. RN present, and pt was given small pill in ice cream. Pt swallowed the ice cream, but the pill remained on the lingual surface. Pill eventually cleared the oral cavity after additional boluses of thin liquid and ice cream. Recommend meds via PEG, or crushed in puree if oral meds are needed.  Pt is safe for trials of thin liquid and puree textures, however, the severity of his left neglect and delayed oral prep and propulsion are prohibitive for adequate and efficient PO intake. Recommend continuing PEG tube feeds for primary nutrition and hydration. SLP will continue to follow to provide PO trials of thin liquid and puree in therapy.   HPI HPI: 70yo male admitted 10/14/20 with right side headache and LUE weakness. 10/19/20 pt was noted to have increased LUE weakness. 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  Recommendations may be updated based on patient status, additional functional criteria and insurance authorization.    Recommendations  Diet recommendations: NPO (ice chips,  sips thin, puree with 1:1 supervision) Liquids provided via: Straw Medication Administration: Via alternative means (or crushed in puree) Supervision: Full supervision/cueing for compensatory strategies Compensations: Minimize environmental distractions;Slow rate;Small sips/bites;Monitor for anterior loss Postural Changes and/or Swallow Maneuvers: Seated upright 90 degrees;Upright 30-60 min after meal                Oral Care Recommendations: Oral care BID;Staff/trained caregiver to provide oral care Follow up Recommendations: Skilled Nursing facility;24 hour supervision/assistance SLP Visit Diagnosis: Dysphagia, oropharyngeal phase (R13.12) Plan: Continue with current plan of care       [place] B. Quentin Ore, [facility], [place] Speech Language Pathologist Office: [phone]  Shonna Chock  11/23/2020, 2:42 PM

## 2020-11-23 NOTE — Progress Notes (Signed)
Nutrition Follow-up  DOCUMENTATION CODES:  Not applicable  INTERVENTION:  Continue TF via PEG: -Jevity 1.5 @ 9m/hr (13276md)  -4527mrosource TF BID -200m25mee water Q4H   Provides 2060 kcals, 106 grams protein, 1003ml36me water (2203ml 68ml free water with flushes)  NUTRITION DIAGNOSIS:  Inadequate oral intake related to dysphagia as evidenced by NPO status. -- ongoing  GOAL:  Patient will meet greater than or equal to 90% of their needs -- met with TF  MONITOR:  TF tolerance, Weight trends, Labs, I & O's  REASON FOR ASSESSMENT:  Consult Enteral/tube feeding initiation and management  ASSESSMENT:  Pt with a PMH significant for CHF, HTN, HLD, PE, recent R MCA/PCA watershed ischemic stroke admitted with evolving/increasing size of stroke, petechial hemorrhages, cytotoxic edema, concern for new seizures.  8/30 PEG placed. Pt continues to tolerate TF via PEG: Jevity 1.5 @ 55ml/h45m5ml Pr36mrce TF BID, 200ml fre49mter Q4H.  Patient able to answer simple questions during RD visit. He says he is doing okay.   S/P MBS with SLP 9/19. Recommendations to continue NPO status due to cognitive impairment. He is allowed small amounts of ice chips, water, and puree with close supervision for pleasure.   Medications reviewed and include Novolog, Keppra, Protonix, Florastor. Labs reviewed. CBGs: [phone]  Admission weight 95.7 kg Current weight 82.1 kg  Diet Order:   Diet Order             Diet NPO time specified  Diet effective now                  EDUCATION NEEDS:  No education needs have been identified at this time  Skin:  Skin Assessment: Skin Integrity Issues: Skin Integrity Issues:: Other (Comment) Other: non-pressure wound base of penis  Last BM:  9/19  Height:  Ht Readings from Last 1 Encounters:  10/14/20 '5\' 5"'  (1.651 m)   Weight:  Wt Readings from Last 1 Encounters:  11/23/20 82.1 kg   Ideal Body Weight:  61.82 kg  BMI:  Body mass  index is 30.12 kg/m.  Estimated Nutritional Needs:  Kcal:  2000-2200 Protein:  100-120 grams Fluid:  >2L   Clifford [name], CNSC Please refer to Amion for contact information.

## 2020-11-24 DIAGNOSIS — I639 Cerebral infarction, unspecified: Secondary | ICD-10-CM | POA: Diagnosis not present

## 2020-11-24 DIAGNOSIS — R531 Weakness: Secondary | ICD-10-CM | POA: Diagnosis not present

## 2020-11-24 LAB — COMPREHENSIVE METABOLIC PANEL
ALT: 45 U/L — ABNORMAL HIGH (ref 0–44)
AST: 35 U/L (ref 15–41)
Albumin: 2.6 g/dL — ABNORMAL LOW (ref 3.5–5.0)
Alkaline Phosphatase: 77 U/L (ref 38–126)
Anion gap: 9 (ref 5–15)
BUN: 32 mg/dL — ABNORMAL HIGH (ref 8–23)
CO2: 21 mmol/L — ABNORMAL LOW (ref 22–32)
Calcium: 8.7 mg/dL — ABNORMAL LOW (ref 8.9–10.3)
Chloride: 106 mmol/L (ref 98–111)
Creatinine, Ser: 1.4 mg/dL — ABNORMAL HIGH (ref 0.61–1.24)
GFR, Estimated: 53 mL/min — ABNORMAL LOW (ref >60)
Glucose, Bld: 138 mg/dL — ABNORMAL HIGH (ref 70–99)
Potassium: 3.8 mmol/L (ref 3.5–5.1)
Sodium: 136 mmol/L (ref 135–145)
Total Bilirubin: 0.4 mg/dL (ref 0.3–1.2)
Total Protein: 6.4 g/dL — ABNORMAL LOW (ref 6.5–8.1)

## 2020-11-24 LAB — CBC
HCT: 34.5 % — ABNORMAL LOW (ref 39.0–52.0)
Hemoglobin: 11.1 g/dL — ABNORMAL LOW (ref 13.0–17.0)
MCH: 26.1 pg (ref 26.0–34.0)
MCHC: 32.2 g/dL (ref 30.0–36.0)
MCV: 81.2 fL (ref 80.0–100.0)
Platelets: 138 10*3/uL — ABNORMAL LOW (ref 150–400)
RBC: 4.25 MIL/uL (ref 4.22–5.81)
RDW: 15.4 % (ref 11.5–15.5)
WBC: 6.7 10*3/uL (ref 4.0–10.5)
nRBC: 0 % (ref 0.0–0.2)

## 2020-11-24 LAB — GLUCOSE, CAPILLARY
Glucose-Capillary: 120 mg/dL — ABNORMAL HIGH (ref 70–99)
Glucose-Capillary: 123 mg/dL — ABNORMAL HIGH (ref 70–99)
Glucose-Capillary: 129 mg/dL — ABNORMAL HIGH (ref 70–99)
Glucose-Capillary: 139 mg/dL — ABNORMAL HIGH (ref 70–99)
Glucose-Capillary: 149 mg/dL — ABNORMAL HIGH (ref 70–99)
Glucose-Capillary: 90 mg/dL (ref 70–99)

## 2020-11-24 NOTE — Plan of Care (Signed)
  Problem: Clinical Measurements: Goal: Ability to maintain clinical measurements within normal limits will improve Outcome: Progressing Goal: Will remain free from infection Outcome: Progressing Goal: Diagnostic test results will improve Outcome: Progressing Goal: Respiratory complications will improve Outcome: Progressing Goal: Cardiovascular complication will be avoided Outcome: Progressing   Problem: Nutrition: Goal: Adequate nutrition will be maintained Outcome: Progressing   Problem: Coping: Goal: Level of anxiety will decrease Outcome: Progressing   Problem: Elimination: Goal: Will not experience complications related to bowel motility Outcome: Progressing Goal: Will not experience complications related to urinary retention Outcome: Progressing   Problem: Pain Managment: Goal: General experience of comfort will improve Outcome: Progressing   Problem: Safety: Goal: Ability to remain free from injury will improve Outcome: Progressing   Problem: Coping: Goal: Will identify appropriate support needs Outcome: Progressing   Problem: Self-Care: Goal: Ability to communicate needs accurately will improve Outcome: Progressing   Problem: Nutrition: Goal: Risk of aspiration will decrease Outcome: Progressing   Problem: Intracerebral Hemorrhage Tissue Perfusion: Goal: Complications of Intracerebral Hemorrhage will be minimized Outcome: Progressing   Problem: Ischemic Stroke/TIA Tissue Perfusion: Goal: Complications of ischemic stroke/TIA will be minimized Outcome: Progressing   Problem: Spontaneous Subarachnoid Hemorrhage Tissue Perfusion: Goal: Complications of Spontaneous Subarachnoid Hemorrhage will be minimized Outcome: Progressing

## 2020-11-24 NOTE — Progress Notes (Signed)
Clinical impression: Session focused on determining splinting needs for pt due to noted increased tone throughout the LUE and for skin integrity of the L foot. Pt tolerated PROM of LUE this session and had increased PROM of LUE with scapular manipulation. Splints to be ordered and utilized with approval from Dr. Darnell Level via secure chat: L soft elbow extension splint, L resting hand splint, L prevalon boot. Pt continues to benefit from OT acutely. D/c recommendation remains appropriate.     11/24/20 1600  OT Visit Information  Last OT Received On 11/24/20  Assistance Needed +2  History of Present Illness 70 yo male presented to ED on 8/11 for R sided headache and worsening L UE weakness. Patient recently admitted 8/3-8/5 for R CVA with L visual deficits and L sided weakness. MRI with mild enlargement of R MCA/border zone infarct since 10/07/20 MRI and increased cytotoxic edema and petechial hemorrhage without midline shift. Palliative Care consulted and in detailed family discussions, with prognosis provided by Neurology (more likely able to regain ability to talk and participate in self cares, unlikely to regain ability to walk, uncertain if able to regain ability to swallow, very unlikely to improve to full independence), it was decided to place PEG and pursue SNF rehab, PEG was placed 8/30. 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  Precautions  Precautions Fall  Precaution Comments L visual field deficit, R gaze preference, L inattention, L hemparesis; G-tube and abdominal binder; penile skin breakdown from long term condom cath use/moisture  Required Braces or Orthoses  (Session focused on determining splinting needs. Ordered: L soft elbow extension splint, L resting hand splint, L prevalon boot.)  Pain Assessment  Pain Assessment Faces  Faces Pain Scale 2  Pain Location LUE with PROM  Pain Descriptors / Indicators Grimacing  Pain Intervention(s) Limited activity within patient's  tolerance;Monitored during session  Cognition  Arousal/Alertness Awake/alert  Behavior During Therapy Flat affect  Overall Cognitive Status No family/caregiver present to determine baseline cognitive functioning  Upper Extremity Assessment  RUE Deficits / Details overall WFL  LUE Deficits / Details noted increased tone in LUE;pt with left sided neglect. Limited PROM of all joints of LUE; pt expressed pain with passive extension of elbow  Vision- Assessment  Additional Comments Attempted different dues and visual techqniues to have pt scan towards midline, pt continued to close his eyes during tasks  Other Exercises  Other Exercises LUE PROM with scapular manipulation  Other Exercises L foot PROM  OT Assessment/Plan  OT Plan Discharge plan remains appropriate  OT Visit Diagnosis Unsteadiness on feet (R26.81);Muscle weakness (generalized) (M62.81);Other symptoms and signs involving cognitive function  OT Frequency (ACUTE ONLY) Min 2X/week  Follow Up Recommendations SNF  OT Equipment None recommended by OT  AM-PAC OT "6 Clicks" Daily Activity Outcome Measure (Version 2)  Help from another person eating meals? 1  Help from another person taking care of personal grooming? 2  Help from another person toileting, which includes using toliet, bedpan, or urinal? 1  Help from another person bathing (including washing, rinsing, drying)? 1  Help from another person to put on and taking off regular upper body clothing? 2  Help from another person to put on and taking off regular lower body clothing? 1  6 Click Score 8  Progressive Mobility  What is the highest level of mobility based on the progressive mobility assessment? Level 1 (Bedfast) - Unable to balance while sitting on edge of bed  Mobility Sit up in  bed/chair position for meals  OT Goal Progression  Progress towards OT goals Not progressing toward goals - comment (continues to be limited by L neglect, L tone, communication deficits)   Acute Rehab OT Goals  Patient Stated Goal None stated  OT Goal Formulation Patient unable to participate in goal setting  Time For Goal Achievement 12/09/20  Potential to Achieve Goals Fair  ADL Goals  Pt Will Perform Lower Body Bathing with mod assist;bed level  Pt Will Perform Lower Body Dressing with mod assist;sitting/lateral leans  Pt Will Transfer to Toilet with mod assist;squat pivot transfer  Pt Will Perform Toileting - Clothing Manipulation and hygiene with mod assist;bed level  Additional ADL Goal #1 Pt will locate three grooming items in the left visual field wtih mod cues  Pt Will Perform Grooming with mod assist;sitting;bed level  Additional ADL Goal #2 Pt will demonstrate emergent awareness during ADLs with Min cues  OT Time Calculation  OT Start Time (ACUTE ONLY) 1438  OT Stop Time (ACUTE ONLY) 1455  OT Time Calculation (min) 17 min  OT General Charges  $OT Visit 1 Visit  OT Treatments  $Therapeutic Exercise 8-22 mins

## 2020-11-24 NOTE — Progress Notes (Signed)
Orthopedic Tech Progress Note Patient Details:  Clifford [name] 1950/05/17 [id number]  Called in order to [employer] for a RESTING HAND SPLINT. THERAPY makes the elbow splint Also let SECRETARY [employer]ON [employer]OT[employer] are in material   Patient ID: Clifford [name], male   DOB: Jul 03, 1950, 70 y.o.   MRN: [id number]  Janit Pagan 11/24/2020, 5:38 PM

## 2020-11-24 NOTE — Progress Notes (Addendum)
PROGRESS NOTE        PATIENT DETAILS Name: Clifford [name] Age: 70 y.o. Sex: male Date of Birth: [birth year] Admit Date: 10/14/2020 Admitting Physician Vernelle Emerald, MD PCP:Pcp, No  Brief Narrative: Patient is a 70 y.o. 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  He was managed with supportive care-hospital course was complicated by worsening somnolence-further imaging studies showed cytotoxic edema with some mass-effect on the right lateral ventricle.  Neurology continued to follow closely-he was managed with continued supportive care.  He continued to exhibit significant left-sided hemiplegia/dysphagia-ultimately required G-tube placement.  Plans are for SNF on discharge.  Subjective: No major issues overnight-lying comfortably in bed.  Continues to have significant left-sided deficits.  Answers simple Questions with yes/no.  Objective: Vitals: Blood pressure 125/71, pulse 75, temperature (!) 97.4 F (36.3 C), temperature source Axillary, resp. rate 16, height 5\' 5"  (1.651 m), weight 82.1 kg, SpO2 100 %.   Exam: Gen Exam: Not in any distress. HEENT:atraumatic, normocephalic Chest: B/L clear to auscultation anteriorly CVS:S1S2 regular Abdomen:soft non tender, non distended Extremities:no edema Neurology: Left-sided hemiplegia Skin: no rash   Pertinent Labs/Radiology/Echo/EEG: 9/10 >>blood culture: No growth. 9/10 >>urine Culture: Insignificant growth  Stroke work-up: 8/3>> CT angio head: No intracranial large vessel occlusion 8/3>> CT angio neck: Minimal atherosclerotic change in the bulb internal carotid artery bulbs.  No stenosis. 8/4>> A1c: 5.9 8/4>> LDL:  75 8/4>> Echo: EF 38-45%, grade 1 diastolic dysfunction. 8/11>> CT head: Expected evolution of Rt MCA/PCA territory infarct w petechial hemorrhage/mild edema. 8/11>> MRI brain: Right MCA infarct-increased cytotoxic edema/petechial hemorrhage without midline shift. 8/16>> CT head: Evolving right posterior MCA territory infarct with increased cytotoxic edema.  Moderate mass-effect on the right lateral ventricle. 8/25>> MRI brain: Progressive swelling in the right hemisphere infarction-minimal petechial blood products.  Small acute infarctions of the inferior basal ganglia on the right.  8/12-10/2011>>LTM EEG: No seizures. 8/12:>>Spot EEG: No seizures  Assessment/Plan: Right MCA/PCA territory infarct with petechial hemorrhage/evolution-with mild edema/slight midline shift on repeat CT head on 8/16: Unchanged-significant left-sided hemiparesis-PEG tube in place.  Work-up as above-on Pradaxa prior to this hospitalization-which was held due to petechial hemorrhage/severe dysphagia-he is now back on Eliquis.  Awaiting SNF bed-remains medically stable for discharge.  Social work following.  Possible focal seizure: Continue Keppra/Topamax.  Headache: Stable on Topamax.  Probably related to CVA/cytotoxic edema.  Fever on 9/10: Resolved-cultures negative.  Completed 5-day course of cefepime.  HTN: BP stable-continue amlodipine/hydralazine/metoprolol.  HLD: Continue Lipitor  AKI on CKD stage IIIa: AKI likely hemodynamically mediated-resolved-creatinine back to baseline  HFpEF: Euvolemic on exam.  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  Nutrition Status: Nutrition Problem: Inadequate oral intake Etiology: dysphagia Signs/Symptoms: NPO status Interventions: Tube feeding, Prostat   Obesity: Estimated body mass index is 30.12 kg/m as calculated from the following:   Height as of this encounter: 5\' 5"  (1.651 m).   Weight as of this encounter:  82.1 kg.    Procedures : 8/30>> G-tube placement by IR Consults: DVT Prophylaxis: Eliquis Code Status: DNR Family Communication: None at bedside  Time  spent: 15 minutes-Greater than 50% of this time was spent in counseling, explanation of diagnosis, planning of further management, and coordination of care.  Diet: Diet Order             Diet NPO time specified  Diet effective now                   Disposition Plan: Status is: Inpatient  Remains inpatient appropriate because:Inpatient level of care appropriate due to severity of illness  Dispo: The patient is from: Home              Anticipated d/c is to: SNF              Patient currently is medically stable to d/c.   Difficult to place patient No    Barriers to [facility] Bed  Antimicrobial agents: Anti-infectives (From admission, onward)    Start     Dose/Rate Route Frequency Ordered Stop   11/14/20 1000  vancomycin (VANCOREADY) IVPB 750 mg/150 mL  Status:  Discontinued        750 mg 150 mL/hr over 60 Minutes Intravenous Every 24 hours 11/13/20 0859 11/14/20 1836   11/13/20 1000  ceFEPIme (MAXIPIME) 2 g in sodium chloride 0.9 % 100 mL IVPB        2 g 200 mL/hr over 30 Minutes Intravenous Every 12 hours 11/13/20 0859 11/17/20 2203   11/13/20 0945  vancomycin (VANCOREADY) IVPB 1250 mg/250 mL        1,250 mg 166.7 mL/hr over 90 Minutes Intravenous  Once 11/13/20 0859 11/13/20 1441   11/02/20 0000  ceFAZolin (ANCEF) IVPB 2g/100 mL premix        2 g 200 mL/hr over 30 Minutes Intravenous To Radiology 11/01/20 1054 11/02/20 1040        MEDICATIONS: Scheduled Meds:  amLODipine  10 mg Per Tube Daily   apixaban  5 mg Per Tube BID   atorvastatin  40 mg Per Tube Daily   erythromycin   Right Eye TID   feeding supplement (PROSource TF)  45 mL Per Tube BID   free water  200 mL Per Tube Q4H   hydrALAZINE  25 mg Per Tube QID   insulin aspart  0-9 Units Subcutaneous Q4H   levETIRAcetam  250 mg Per Tube  BID   mouth rinse  15 mL Mouth Rinse BID   metoprolol tartrate  12.5 mg Per Tube BID   modafinil  100 mg Per Tube Daily   nystatin  5 mL Mouth/Throat QID   pantoprazole sodium  40 mg Per Tube Daily   saccharomyces boulardii  250 mg Per Tube BID   topiramate  50 mg Per Tube BID   Continuous Infusions:  feeding supplement (JEVITY 1.5 CAL/FIBER) 1,000 mL (11/24/20 1102)   PRN Meds:.acetaminophen **OR** acetaminophen (TYLENOL) oral liquid 160 mg/5 mL **OR** acetaminophen, guaiFENesin, labetalol, lip balm, ondansetron (ZOFRAN) IV, polyethylene glycol   I have personally reviewed following labs and imaging studies  LABORATORY DATA: CBC: Recent Labs  Lab 11/18/20 0339 11/19/20 0342 11/24/20 0357  WBC 6.7 7.6 6.7  HGB 11.1* 10.4* 11.1*  HCT 35.5* 33.4* 34.5*  MCV 82.8 82.9 81.2  PLT 103* 111* 138*     Basic Metabolic Panel: Recent Labs  Lab 11/18/20 0339 11/19/20 0342 11/24/20 0357  NA 142 141 136  K 4.1 4.0 3.8  CL 113* 113* 106  CO2 20* 21* 21*  GLUCOSE 121* 109* 138*  BUN 36* 39* 32*  CREATININE  1.39* 1.51* 1.40*  CALCIUM 8.6* 8.8* 8.7*  MG 2.0  --   --   PHOS 3.5  --   --      GFR: Estimated Creatinine Clearance: 47 mL/min (A) (by C-G formula based on SCr of 1.4 mg/dL (H)).  Liver Function Tests: Recent Labs  Lab 11/24/20 0357  AST 35  ALT 45*  ALKPHOS 77  BILITOT 0.4  PROT 6.4*  ALBUMIN 2.6*   No results for input(s): LIPASE, AMYLASE in the last 168 hours. No results for input(s): AMMONIA in the last 168 hours.  Coagulation Profile: No results for input(s): INR, PROTIME in the last 168 hours.  Cardiac Enzymes: No results for input(s): CKTOTAL, CKMB, CKMBINDEX, TROPONINI in the last 168 hours.  BNP (last 3 results) No results for input(s): PROBNP in the last 8760 hours.  Lipid Profile: No results for input(s): CHOL, HDL, LDLCALC, TRIG, CHOLHDL, LDLDIRECT in the last 72 hours.  Thyroid Function Tests: No results for input(s): TSH, T4TOTAL,  FREET4, T3FREE, THYROIDAB in the last 72 hours.  Anemia Panel: No results for input(s): VITAMINB12, FOLATE, FERRITIN, TIBC, IRON, RETICCTPCT in the last 72 hours.  Urine analysis:    Component Value Date/Time   COLORURINE YELLOW 11/13/2020 0824   APPEARANCEUR CLEAR 11/13/2020 0824   LABSPEC 1.015 11/13/2020 0824   PHURINE 6.5 11/13/2020 0824   GLUCOSEU NEGATIVE 11/13/2020 0824   HGBUR NEGATIVE 11/13/2020 0824   BILIRUBINUR NEGATIVE 11/13/2020 0824   KETONESUR NEGATIVE 11/13/2020 0824   PROTEINUR NEGATIVE 11/13/2020 0824   NITRITE NEGATIVE 11/13/2020 0824   LEUKOCYTESUR NEGATIVE 11/13/2020 0824    Sepsis Labs: Lactic Acid, Venous No results found for: LATICACIDVEN  MICROBIOLOGY: No results found for this or any previous visit (from the past 240 hour(s)).   RADIOLOGY STUDIES/RESULTS: No results found.   LOS: 41 days   Oren Binet, MD  [facility]    To contact the attending provider between 7A-7P or the covering provider during after hours 7P-7A, please log into the web site [email or web address] and access using universal [place] password for that web site. If you do not have the password, please call the hospital operator.  11/24/2020, 11:58 AM

## 2020-11-24 NOTE — TOC Progression Note (Signed)
Transition of Care [facility]) - Progression Note    Patient Details  Name: CAROL LOFTIN MRN: [id number] Date of Birth: May 27, 1950  Transition of Care [facility]) CM/SW [place], [place] Phone Number: 11/24/2020, 1:17 PM  Clinical Narrative:   CSW met with patient's brother per brother's request to discuss barrier to discharge. Brother was asking about issues with Medicaid since the patient has an [place] address, and CSW directed the brother to contact the financial counselor about that question. Brother was also asking about insurance covering the patient getting a medical flight back to [place], and CSW explained that wouldn't be covered. Patient could return to [place] if he improves to be able to ride in a car or get a regular flight back home, but that insurance would not cover any of that. Brother indicated understanding. CSW also discussed with brother that another CSW will be taking over the patient's case, and the brother indicated understanding. CSW to follow.    Expected Discharge Plan: Skilled Nursing Facility Barriers to Discharge: [place] (PASRR), Continued Medical Work up, Ship broker, Inadequate or no Biomedical scientist Expected Discharge Plan: [place] Choice: [place] arrangements for the past 2 months: Single Family Home                                       Social Determinants of Health (SDOH) Interventions    Readmission Risk Interventions No flowsheet data found.

## 2020-11-25 DIAGNOSIS — R531 Weakness: Secondary | ICD-10-CM | POA: Diagnosis not present

## 2020-11-25 LAB — GLUCOSE, CAPILLARY
Glucose-Capillary: 128 mg/dL — ABNORMAL HIGH (ref 70–99)
Glucose-Capillary: 123 mg/dL — ABNORMAL HIGH (ref 70–99)
Glucose-Capillary: 122 mg/dL — ABNORMAL HIGH (ref 70–99)
Glucose-Capillary: 143 mg/dL — ABNORMAL HIGH (ref 70–99)
Glucose-Capillary: 115 mg/dL — ABNORMAL HIGH (ref 70–99)

## 2020-11-25 MED ORDER — DICLOFENAC SODIUM 1 % EX GEL
2.0000 g | Freq: Four times a day (QID) | CUTANEOUS | Status: DC | PRN
  Filled 2020-11-25: qty 100

## 2020-11-25 NOTE — Progress Notes (Signed)
[facility] PROGRESS NOTE  Clifford NOVELO KWI:[id number] DOB: June 29, 1950 DOA: 10/14/2020 PCP: Pcp, No  Status:  Remains inpatient appropriate because:Unsafe d/c plan  Dispo: The patient is from: Home              Anticipated d/c is to: SNF              Patient currently is medically stable to d/c.   Difficult to place patient Yes              Barriers to discharge: Needs Medicaid number which is  pending-is from [place] if they were made aware of current admission- does not have Medicare   Level of care: Telemetry Medical   Code Status:  Family Communication:  DVT prophylaxis:  COVID vaccination status:  HPI: 70 y.o. 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  He was managed with supportive care-hospital course was complicated by worsening somnolence-further imaging studies showed cytotoxic edema with some mass-effect on the right lateral ventricle.  Neurology continued to follow closely-he was managed with continued supportive care.  He continued to exhibit significant left-sided hemiplegia/dysphagia-ultimately required G-tube placement.  Plans are for SNF on discharge.   Subjective: Awakened.  Makes eye contact and is able to confirm that he had previously been in the [employer].  Objective: Vitals:   11/25/20 0425 11/25/20 0742  BP: 108/65 125/70  Pulse: 68 74  Resp: 16 14  Temp: 98.6 F (37 C) 98.6 F (37 C)  SpO2: 98% 100%    Intake/Output Summary (Last 24 hours) at 11/25/2020 0759 Last data filed at 11/25/2020 0500 Gross per 24 hour  Intake 220 ml  Output 450 ml  Net -230 ml   Filed Weights   11/22/20 0600 11/23/20 0450 11/25/20 0500  Weight: 82 kg 82.1 kg 86.8 kg     Exam:  Constitutional: NAD, calm, comfortable Respiratory: clear to auscultation bilaterally, no wheezing, no crackles. Normal respiratory effort. No accessory muscle use.  Cardiovascular: Regular rate and rhythm, no murmurs / rubs / gallops. No extremity edema. 2+ pedal pulses. No carotid bruits.  Abdomen: no tenderness, no masses palpated. No hepatosplenomegaly. Bowel sounds positive.  Musculoskeletal: no clubbing / cyanosis. No joint deformity upper and lower extremities. Good ROM, no contractures. Normal muscle tone.  Skin: no rashes, lesions, ulcers. No induration Neurologic: CN 2-12 intact except for documented left visual field deficit with right gaze preference.  With documented dysphagia requiring PEG tube.  When tested patient reports that he is feeling sensation on the left but when I asked him to lift the arm that I was pinching he lifted the right arm.  Patient clearly has left-sided neglect with similar activity noted with left leg.  Also noted with left foot drop Psychiatric: Awake and oriented at least to name and place.   Stroke work-up: 8/3>> CT angio head: No intracranial large vessel occlusion 8/3>> CT angio neck: Minimal atherosclerotic change in the bulb internal carotid artery bulbs.  No stenosis. 8/4>> A1c: 5.9 8/4>> LDL: 75 8/4>> Echo: EF 24-26%, grade 1 diastolic dysfunction. 8/11>> CT head: Expected evolution of Rt MCA/PCA territory infarct w petechial hemorrhage/mild edema. 8/11>> MRI brain: Right MCA infarct-increased cytotoxic edema/petechial hemorrhage without midline shift. 8/16>> CT head: Evolving right posterior MCA territory infarct with increased  cytotoxic edema.  Moderate mass-effect on the right lateral ventricle. 8/25>> MRI brain: Progressive swelling in the right hemisphere infarction-minimal petechial blood products.  Small acute infarctions of the inferior basal ganglia on the right.  8/12-10/2011>>LTM EEG: No seizures. 8/12:>>Spot EEG: No  seizures    Assessment/Plan: Acute problems: Right MCA/PCA territory infarct with petechial hemorrhage/evolution-with mild edema/slight midline shift on repeat CT head on 8/16:  Stable with dense left hemiplegia and left-sided neglect-unable to ambulate at this time Work-up as above-on -Pradaxa prescribed prior to this hospitalization but was held due to petechial hemorrhage/severe dysphagia-he is now back on Eliquis.   Awaiting SNF bed which I suspect will end up becoming long-term Patient is a veteran and has a Medicaid application. Noted with left foot drop and bedside staff state a splint has been ordered   Possible focal seizure:  Continue Keppra/Topamax.   Headache:  Stable on Topamax.  Probably related to CVA/recent cytotoxic edema.   Fever on 9/10:  Resolved-cultures negative.   Completed 5-day course of cefepime.   HTN:  continue amlodipine/hydralazine/metoprolol.   HLD:  Continue Lipitor   AKI on CKD stage IIIa:  AKI likely hemodynamically mediated-resolved-creatinine back to baseline of 0.6-2.3   Rate 1 diastolic dysfunction HFpEF:  Euvolemic on exam. 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   Nutrition Status: Nutrition Problem: Inadequate oral intake Etiology: dysphagia Signs/Symptoms: NPO status Interventions: Tube feeding, Prostat   Obesity: Estimated body mass index is 30.12 kg/m as calculated from the following:   Height as of this encounter: 5\' 5"  (1.651 m).   Weight as of this encounter: 82.1 kg.     Data Reviewed: Basic Metabolic Panel: Recent Labs  Lab 11/19/20 0342 11/24/20 0357  NA 141 136  K 4.0 3.8  CL 113* 106  CO2 21* 21*  GLUCOSE 109* 138*  BUN 39* 32*  CREATININE 1.51* 1.40*  CALCIUM 8.8* 8.7*   Liver Function Tests: Recent Labs  Lab 11/24/20 0357   AST 35  ALT 45*  ALKPHOS 77  BILITOT 0.4  PROT 6.4*  ALBUMIN 2.6*   CBC: Recent Labs  Lab 11/19/20 0342 11/24/20 0357  WBC 7.6 6.7  HGB 10.4* 11.1*  HCT 33.4* 34.5*  MCV 82.9 81.2  PLT 111* 138*    CBG: Recent Labs  Lab 11/24/20 1148 11/24/20 1639 11/24/20 2023 11/24/20 2356 11/25/20 0443  GLUCAP 123* 120* 90 139* 128*      Scheduled Meds:  amLODipine  10 mg Per Tube Daily   apixaban  5 mg Per Tube BID   atorvastatin  40 mg Per Tube Daily   erythromycin   Right Eye TID   feeding supplement (PROSource TF)  45 mL Per Tube BID   free water  200 mL Per Tube Q4H   hydrALAZINE  25 mg Per Tube QID   insulin aspart  0-9 Units Subcutaneous Q4H   levETIRAcetam  250 mg Per Tube BID   mouth rinse  15 mL Mouth Rinse BID   metoprolol tartrate  12.5 mg Per Tube BID   modafinil  100 mg Per Tube Daily   nystatin  5 mL Mouth/Throat QID   pantoprazole sodium  40 mg Per Tube Daily   saccharomyces boulardii  250 mg Per Tube BID   topiramate  50 mg Per Tube BID   Continuous  Infusions:  feeding supplement (JEVITY 1.5 CAL/FIBER) 1,000 mL (11/24/20 1102)    Principal Problem:   Left-sided weakness Active Problems:   Stroke [facility])   Essential hypertension   Mixed hyperlipidemia   Prediabetes   History of pulmonary embolism   Chronic anticoagulation   Nicotine dependence, cigarettes, uncomplicated   Petechial hemorrhage   Acute stroke due to ischemia [facility])   Advanced care planning/counseling discussion   Goals of care, counseling/discussion   Palliative care by specialist   Consultants: Neurology Interventional radiology  Procedures: Cortrack tube EEG PEG tube  Antibiotics: Cefepime 9/10 through 9/14 Vancomycin dose on 9/10   Time spent: 35 minutes    Erin Hearing ANP  [facility] 7 am - 330 pm/M-F for direct patient care and secure chat Please refer to Amion for contact info 42  days

## 2020-11-25 NOTE — Progress Notes (Signed)
Physical Therapy Treatment Patient Details Name: Clifford [name] MRN: [id number] DOB: March 03, 1951 Today's Date: 11/25/2020   History of Present Illness 70 yo male presented to ED on 8/11 for R sided headache and worsening L UE weakness. Patient recently admitted 8/3-8/5 for R CVA with L visual deficits and L sided weakness. MRI with mild enlargement of R MCA/border zone infarct since 10/07/20 MRI and increased cytotoxic edema and petechial hemorrhage without midline shift. Palliative Care consulted and in detailed family discussions, with prognosis provided by Neurology (more likely able to regain ability to talk and participate in self cares, unlikely to regain ability to walk, uncertain if able to regain ability to swallow, very unlikely to improve to full independence), it was decided to place PEG and pursue SNF rehab, PEG was placed 8/30. 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    PT Comments    Pt received in supine, alert and oriented to self and to location with increased time, and with good participation and tolerance for bed mobility and seated balance training. Pt continues to demonstrate severe R gaze preference, able to reach midline gaze intermittently with max visual/verbal cues. Pt with some delirium and seeing "a person in black" standing behind therapist on multiple occasions during session, denies diplopia but difficulty identifying how many fingers therapist holding up when PTA hand raised in pt's R visual field. Pt with poor seated balance and noted to be incontinent of stool after sitting up EOB, needing maxA for rolling and totalA for peri-care. Pt with increased LLE spasticity/rigidity this date. Pt continues to benefit from PT services to progress toward functional mobility goals.   Recommendations for follow up therapy are one component of a multi-disciplinary discharge planning process, led by the attending physician.  Recommendations may be updated based on patient status,  additional functional criteria and insurance authorization.  Follow Up Recommendations  SNF;Supervision/Assistance - 24 hour     Equipment Recommendations  Other (comment) (TBD)    Recommendations for Other Services       Precautions / Restrictions Precautions Precautions: Fall Precaution Comments: L visual field deficit, R gaze preference, L inattention, L hemparesis; G-tube and abdominal binder; penile skin breakdown from long term condom cath use/moisture Required Braces or Orthoses: Other Brace (Prevalon boots placed during session, OT placed L soft elbow splint and L resting hand splint donned previously (already on)) Restrictions Weight Bearing Restrictions: No     Mobility  Bed Mobility Overal bed mobility: Needs Assistance Bed Mobility: Rolling;Sidelying to Sit;Sit to Supine Rolling: Max assist;+2 for physical assistance;+2 for safety/equipment;Mod assist Sidelying to sit: Max assist;+2 for physical assistance;+2 for safety/equipment   Sit to supine: Max assist;+2 for physical assistance;+2 for safety/equipment   General bed mobility comments: Pt required max A +2 to roll to the R side due to L sided weakness and mod A +2 for L side. Max to elevate trunk to sitting EOB and max A + 2 to bring BLE back into bed and reposition. +2 modA for posterior supine scooting with HOB flat.    Transfers       General transfer comment: Differed due to weakness and poor balance sitting EOB      Modified Rankin (Stroke Patients Only) Modified Rankin (Stroke Patients Only) Pre-Morbid Rankin Score: Moderately severe disability Modified Rankin: Severe disability     Balance Overall balance assessment: Needs assistance Sitting-balance support: Single extremity supported;Feet supported Sitting balance-Leahy Scale: Poor Sitting balance - Comments: Pt requiring max A support  to maintain upright sitting EOB, even with cueing to lean forward, pt unable to maintain upright  positioning; pt needs assist for L/R leans Postural control: Posterior lean                                  Cognition Arousal/Alertness: Awake/alert Behavior During Therapy: Flat affect Overall Cognitive Status: No family/caregiver present to determine baseline cognitive functioning Area of Impairment: Orientation;Memory;Following commands;Safety/judgement;Awareness;Problem solving                 Orientation Level: Disoriented to;Time;Situation Current Attention Level: Sustained Memory: Decreased short-term memory;Decreased recall of precautions Following Commands: Follows one step commands inconsistently;Follows one step commands with increased time;Follows multi-step commands inconsistently Safety/Judgement: Decreased awareness of safety;Decreased awareness of deficits Awareness: Intellectual Problem Solving: Slow processing;Decreased initiation;Requires verbal cues;Requires tactile cues;Difficulty sequencing General Comments: Pt more aware and participatory this session. He was able to hold simple conversations and worked on attending to simple commands. More aware of deficits with his L side this session but pt continues to be unable to gaze past midline and difficulty achieving midline gaze. Pt unaware of bowel incontinence.      Exercises Other Exercises Other Exercises: L foot PROM in supine x10 reps Other Exercises: R hand gross grasp/finger extension x10 reps Other Exercises: RUE reaching anterior/laterally x5 reps ea direction    General Comments General comments (skin integrity, edema, etc.): VSS, L elbow splint donned with OT assist, bilateral prevalon boots donned, and L resting hand splint in place. Brother [name] entering room toward end of session. RN notified of pt spasticity/complaint of L hip pain      Pertinent Vitals/Pain Pain Assessment: Faces Faces Pain Scale: Hurts little more Pain Location: LLE (hip/buttocks with PROM and rolling to R  side) Pain Descriptors / Indicators: Grimacing;Discomfort;Moaning Pain Intervention(s): Limited activity within patient's tolerance;Monitored during session;Repositioned (RN notified)    Home Living                      Prior Function            PT Goals (current goals can now be found in the care plan section) Acute Rehab PT Goals Patient Stated Goal: None stated PT Goal Formulation: With patient Time For Goal Achievement: 11/26/20 Progress towards PT goals: Progressing toward goals (slow progress, but improving alertness)    Frequency    Min 2X/week      PT Plan Current plan remains appropriate    Co-evaluation PT/OT/SLP Co-Evaluation/Treatment: Yes Reason for Co-Treatment: Complexity of the patient's impairments (multi-system involvement);Necessary to address cognition/behavior during functional activity;For patient/therapist safety;To address functional/ADL transfers PT goals addressed during session: Mobility/safety with mobility;Balance;Strengthening/ROM        AM-PAC PT "6 Clicks" Mobility   Outcome Measure  Help needed turning from your back to your side while in a flat bed without using bedrails?: A Lot (a lot to total) Help needed moving from lying on your back to sitting on the side of a flat bed without using bedrails?: Total Help needed moving to and from a bed to a chair (including a wheelchair)?: Total Help needed standing up from a chair using your arms (e.g., wheelchair or bedside chair)?: Total Help needed to walk in hospital room?: Total Help needed climbing 3-5 steps with a railing? : Total 6 Click Score: 7    End of Session   Activity Tolerance: Patient tolerated treatment well Patient  left: in bed;with bed alarm set;with family/visitor present;with call Dildine/phone within reach Nurse Communication: Mobility status;Other (comment) (L hip c/o pain, incontinence, may need new catheter place/readjusted) PT Visit Diagnosis: Muscle weakness  (generalized) (M62.81);Other abnormalities of gait and mobility (R26.89);Hemiplegia and hemiparesis Hemiplegia - Right/Left: Left     Time: 5732-2025 PT Time Calculation (min) (ACUTE ONLY): 35 min  Charges:  $Therapeutic Activity: 8-22 mins                     Tajanae Guilbault P., PTA Acute Rehabilitation Services Pager: [phone] Office: [place] 11/25/2020, 4:14 PM

## 2020-11-25 NOTE — Plan of Care (Signed)
  Problem: Education: Goal: Knowledge of General Education information will improve Description: Including pain rating scale, medication(s)/side effects and non-pharmacologic comfort measures Outcome: Progressing   Problem: Health Behavior/Discharge Planning: Goal: Ability to manage health-related needs will improve Outcome: Progressing   Problem: Clinical Measurements: Goal: Ability to maintain clinical measurements within normal limits will improve Outcome: Progressing Goal: Will remain free from infection Outcome: Progressing Goal: Diagnostic test results will improve Outcome: Progressing Goal: Cardiovascular complication will be avoided Outcome: Progressing   Problem: Nutrition: Goal: Adequate nutrition will be maintained Outcome: Progressing   Problem: Coping: Goal: Level of anxiety will decrease Outcome: Progressing   Problem: Elimination: Goal: Will not experience complications related to bowel motility Outcome: Progressing Goal: Will not experience complications related to urinary retention Outcome: Progressing   Problem: Pain Managment: Goal: General experience of comfort will improve Outcome: Progressing   Problem: Safety: Goal: Ability to remain free from injury will improve Outcome: Progressing   Problem: Education: Goal: Knowledge of disease or condition will improve Outcome: Progressing Goal: Knowledge of secondary prevention will improve Outcome: Progressing Goal: Knowledge of patient specific risk factors addressed and post discharge goals established will improve Outcome: Progressing Goal: Individualized Educational Video(s) Outcome: Progressing   Problem: Coping: Goal: Will verbalize positive feelings about self Outcome: Progressing Goal: Will identify appropriate support needs Outcome: Progressing   Problem: Health Behavior/Discharge Planning: Goal: Ability to manage health-related needs will improve Outcome: Progressing   Problem:  Self-Care: Goal: Verbalization of feelings and concerns over difficulty with self-care will improve Outcome: Progressing Goal: Ability to communicate needs accurately will improve Outcome: Progressing   Problem: Intracerebral Hemorrhage Tissue Perfusion: Goal: Complications of Intracerebral Hemorrhage will be minimized Outcome: Progressing   Problem: Ischemic Stroke/TIA Tissue Perfusion: Goal: Complications of ischemic stroke/TIA will be minimized Outcome: Progressing   Problem: Spontaneous Subarachnoid Hemorrhage Tissue Perfusion: Goal: Complications of Spontaneous Subarachnoid Hemorrhage will be minimized Outcome: Progressing

## 2020-11-25 NOTE — Progress Notes (Signed)
SLP Cancellation Note  Patient Details Name: Clifford [name] MRN: [id number] DOB: Aug 21, 1950   Cancelled treatment:       Reason Eval/Treat Not Completed: Patient at procedure or test/unavailable   Elvina Sidle, M.S., CCC-SLP 11/25/2020, 12:46 PM

## 2020-11-25 NOTE — Progress Notes (Signed)
Occupational Therapy Treatment Patient Details Name: Clifford [name] MRN: [id number] DOB: June 18, 1950 Today's Date: 11/25/2020   History of present illness 70 yo male presented to ED on 8/11 for R sided headache and worsening L UE weakness. Patient recently admitted 8/3-8/5 for R CVA with L visual deficits and L sided weakness. MRI with mild enlargement of R MCA/border zone infarct since 10/07/20 MRI and increased cytotoxic edema and petechial hemorrhage without midline shift. Palliative Care consulted and in detailed family discussions, with prognosis provided by Neurology (more likely able to regain ability to talk and participate in self cares, unlikely to regain ability to walk, uncertain if able to regain ability to swallow, very unlikely to improve to full independence), it was decided to place PEG and pursue SNF rehab, PEG was placed 8/30. 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    OT comments  Pt making incremental progress with OT goals this session. Pt was more alert and interactive this session. Requiring mod-max A +2 for rolling and max A +2 for all other bed mobility. Once sitting EOB pt requiring max A to maintain upright midline posture. Pt tolerated OT donning soft elbow splint and checking his resting hand splint. SNF remains appropriate for pt discharge and OT will continue to follow acutely.    Recommendations for follow up therapy are one component of a multi-disciplinary discharge planning process, led by the attending physician.  Recommendations may be updated based on patient status, additional functional criteria and insurance authorization.    Follow Up Recommendations  SNF    Equipment Recommendations  None recommended by OT    Recommendations for Other Services      Precautions / Restrictions Precautions Precautions: Fall (Simultaneous filing. User may not have seen previous data.) Precaution Comments: L visual field deficit, R gaze preference, L inattention, L  hemparesis; G-tube and abdominal binder; penile skin breakdown from long term condom cath use/moisture (Simultaneous filing. User may not have seen previous data.) Required Braces or Orthoses:  (Session focused on determining splinting needs. Ordered: L soft elbow extension splint, L resting hand splint, L prevalon boot.  Simultaneous filing. User may not have seen previous data.) Splint/Cast: Resting hand splint checked and soft elbow splint applied Restrictions Weight Bearing Restrictions: No       Mobility Bed Mobility Overal bed mobility: Needs Assistance Bed Mobility: Rolling;Sidelying to Sit;Sit to Supine Rolling: Max assist;Total assist;+2 for physical assistance;+2 for safety/equipment Sidelying to sit: Max assist;+2 for physical assistance;+2 for safety/equipment   Sit to supine: Max assist;+2 for physical assistance;+2 for safety/equipment   General bed mobility comments: Pt required max A +2 to roll to the R side due to L sided weakness and mod A +2 for L side. Max to elevate trunk to sitting EOB and max A + 2 to bring BLE back into bed and reposition    Transfers                 General transfer comment: Differed due to weakness and poor balance sitting EOB    Balance Overall balance assessment: Needs assistance Sitting-balance support: Single extremity supported;Feet supported Sitting balance-Leahy Scale: Poor Sitting balance - Comments: Pt requiring max A support to maintain upright sitting EOB, even with cueing to lean forward, pt unable to maintain upright positioning Postural control: Posterior lean;Left lateral lean  ADL either performed or assessed with clinical judgement   ADL Overall ADL's : Needs assistance/impaired Eating/Feeding: NPO Eating/Feeding Details (indicate cue type and reason): Gtube in place Grooming: Moderate assistance;Wash/dry hands;Wash/dry face Grooming Details (indicate cue type and  reason): Mod A to involveL hand in grooming task. Pt did not attend to L hand despite maximal cueing     Lower Body Bathing: Total assistance;+2 for physical assistance;+2 for safety/equipment Lower Body Bathing Details (indicate cue type and reason): Bed level for BM clean up                       General ADL Comments: Limited to bed level tasks this session due to weakness and balance deficits sitting EOB     Vision   Additional Comments: Pt maintaining R gaze preference, requiring increased time and cuing to shift his gaze closer to midline.   Perception     Praxis      Cognition Arousal/Alertness: Awake/alert (Simultaneous filing. User may not have seen previous data.) Behavior During Therapy: Flat affect (Simultaneous filing. User may not have seen previous data.) Overall Cognitive Status: No family/caregiver present to determine baseline cognitive functioning (Simultaneous filing. User may not have seen previous data.) Area of Impairment: Orientation;Memory;Following commands;Safety/judgement;Awareness;Problem solving                 Orientation Level: Disoriented to;Place;Time;Situation Current Attention Level: Sustained Memory: Decreased short-term memory Following Commands: Follows one step commands inconsistently;Follows one step commands with increased time Safety/Judgement: Decreased awareness of safety;Decreased awareness of deficits Awareness: Intellectual Problem Solving: Slow processing;Decreased initiation;Requires verbal cues;Requires tactile cues;Difficulty sequencing General Comments: Pt more aware and participatory this session. He was able to hold simple conversations and worked on attending to simple commands. More aware of deficits with his L side this session.        Exercises Exercises: Other exercises Other Exercises Other Exercises: LUE PROM with scapular manipulation (Simultaneous filing. User may not have seen previous data.) Other  Exercises: L foot PROM (Simultaneous filing. User may not have seen previous data.) Other Exercises: R hand gross grasp/finger extension x10 reps   Shoulder Instructions       General Comments VSS, L elbow splint donned, bilateral prevalon boots donned, and L resting hand splint checked.    Pertinent Vitals/ Pain       Pain Assessment: Faces Faces Pain Scale: Hurts a little bit (Simultaneous filing. User may not have seen previous data.) Pain Location: LUE with PROM (Simultaneous filing. User may not have seen previous data.) Pain Descriptors / Indicators: Grimacing (Simultaneous filing. User may not have seen previous data.) Pain Intervention(s): Limited activity within patient's tolerance;Monitored during session;Repositioned  Home Living                                          Prior Functioning/Environment              Frequency  Min 2X/week        Progress Toward Goals  OT Goals(current goals can now be found in the care plan section)  Progress towards OT goals: Progressing toward goals  Acute Rehab OT Goals Patient Stated Goal: None stated (Simultaneous filing. User may not have seen previous data.) OT Goal Formulation: Patient unable to participate in goal setting Time For Goal Achievement: 12/09/20 Potential to Achieve Goals: Fair ADL Goals Pt Will Perform Grooming:  with mod assist;sitting;bed level Pt Will Perform Lower Body Bathing: with mod assist;bed level Pt Will Perform Lower Body Dressing: with mod assist;sitting/lateral leans Pt Will Transfer to Toilet: with mod assist;squat pivot transfer Pt Will Perform Toileting - Clothing Manipulation and hygiene: with mod assist;bed level Additional ADL Goal #1: Pt will locate three grooming items in the left visual field wtih mod cues Additional ADL Goal #2: Pt will demonstrate emergent awareness during ADLs with Min cues  Plan Discharge plan remains appropriate    Co-evaluation       Reason for Co-Treatment: Complexity of the patient's impairments (multi-system involvement);Necessary to address cognition/behavior during functional activity;For patient/therapist safety;To address functional/ADL transfers PT goals addressed during session: Mobility/safety with mobility;Balance;Strengthening/ROM        AM-PAC OT "6 Clicks" Daily Activity     Outcome Measure   Help from another person eating meals?: Total Help from another person taking care of personal grooming?: A Lot Help from another person toileting, which includes using toliet, bedpan, or urinal?: Total Help from another person bathing (including washing, rinsing, drying)?: Total Help from another person to put on and taking off regular upper body clothing?: A Lot Help from another person to put on and taking off regular lower body clothing?: Total 6 Click Score: 8    End of Session Equipment Utilized During Treatment: Other (comment) (resting hand splint and soft elbow splint)  OT Visit Diagnosis: Unsteadiness on feet (R26.81);Muscle weakness (generalized) (M62.81);Other symptoms and signs involving cognitive function   Activity Tolerance Patient tolerated treatment well   Patient Left in bed;with call [place]/phone within reach;with bed alarm set   Nurse Communication Mobility status        Time: 3403-5248 OT Time Calculation (min): 35 min  Charges: OT General Charges $OT Visit: 1 Visit OT Treatments $Therapeutic Activity: 8-22 mins  Zaliah Wissner H., OTR/L Acute Rehabilitation  Marwah Disbro Elane Yolanda Bonine 11/25/2020, 3:58 PM

## 2020-11-26 DIAGNOSIS — R531 Weakness: Secondary | ICD-10-CM | POA: Diagnosis not present

## 2020-11-26 LAB — GLUCOSE, CAPILLARY
Glucose-Capillary: 104 mg/dL — ABNORMAL HIGH (ref 70–99)
Glucose-Capillary: 111 mg/dL — ABNORMAL HIGH (ref 70–99)
Glucose-Capillary: 115 mg/dL — ABNORMAL HIGH (ref 70–99)
Glucose-Capillary: 117 mg/dL — ABNORMAL HIGH (ref 70–99)
Glucose-Capillary: 120 mg/dL — ABNORMAL HIGH (ref 70–99)
Glucose-Capillary: 128 mg/dL — ABNORMAL HIGH (ref 70–99)

## 2020-11-26 MED ORDER — METAXALONE 800 MG PO TABS
400.0000 mg | ORAL_TABLET | Freq: Three times a day (TID) | ORAL | Status: DC
  Administered 2020-11-26 – 2020-12-06 (×32): 400 mg
  Filled 2020-11-26 (×2): qty 0.5
  Filled 2020-11-26 (×3): qty 1
  Filled 2020-11-26 (×4): qty 0.5
  Filled 2020-11-26: qty 1
  Filled 2020-11-26 (×22): qty 0.5
  Filled 2020-11-26: qty 1
  Filled 2020-11-26 (×7): qty 0.5

## 2020-11-26 NOTE — Progress Notes (Signed)
Palliative Medicine RN Note: Discussed pt in team rounds.   Goals are clear, and pt is on the "difficult to place" list. There is nothing for PMT to offer at this time.  Our team will sign off. If Clifford [name]'s family requests PMT meeting, please re-consult [place] and call our office at [phone].  Marjie Skiff Marguetta Windish, RN, BSN, [facility] Palliative Medicine Team 11/26/2020 10:21 AM Office [phone]

## 2020-11-26 NOTE — Progress Notes (Signed)
Speech Language Pathology Treatment: Dysphagia  Patient Details Name: Clifford [name] MRN: [id number] DOB: April 11, 1950 Today's Date: 11/26/2020 Time: 8372-9021 SLP Time Calculation (min) (ACUTE ONLY): 30 min  Assessment / Plan / Recommendation Clinical Impression  Pt alert and participatory; brother at bedside. MBS from 9/21 reviewed with brother. Soft mitt removed and pt was offered water and pudding. He fed himself entire container of pudding with only min verbal/tactile cues needed to slow down.  He consumed three cups of water over entire session with occasional cues needed to attend to spillage from left side of mouth.  There were no concerns for aspiration, and improved oral manipulation based on chart review.  Recommend initiating a PO diet - dysphagia 1/thin liquids.  Provide full supervision but allow pt to feed himself with RUE.   Will reach out to dietitian to adjust TF when appropriate. D/W RN, brother and daughter, who Facetimed during session.    HPI HPI: 70yo male admitted 10/14/20 with right side headache and LUE weakness. 10/19/20 pt was noted to have increased LUE weakness. 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  Recommendations may be updated based on patient status, additional functional criteria and insurance authorization.    Recommendations  Diet recommendations: Dysphagia 1 (puree);Thin liquid Liquids provided via: Straw Medication Administration: Via alternative means Compensations: Minimize environmental distractions                Oral Care Recommendations: Oral care BID Follow up Recommendations: Skilled Nursing facility SLP  Visit Diagnosis: Dysphagia, oropharyngeal phase (R13.12) Plan: Continue with current plan of care       Clifford [name] L. Tivis Ringer, [place] CCC/SLP Acute Rehabilitation Services Office number [phone] Pager [phone]                  Assunta Curtis  11/26/2020, 3:16 PM

## 2020-11-26 NOTE — Progress Notes (Addendum)
1:45pm: CSW spoke with Diane at [facility] who states the patient is not service connected so the patient and / or his family would be responsible for transportation costs. Diane states per the patient's chart he has Medicare A #[id number]. Diane states the patient is due for a financial test and that his family can call [phone] to complete the evaluation.   CSW will contact patient's family to inform them of the above information.  12pm: CSW spoke with Vida Roller, RN CM regarding a expressed interested in a possible transfer to the [place] in [place], [place] by the patient's son.  CSW attempted to reach staff at [phone] without success - a voicemail was left requesting a return call.  11:30am: CSW spoke with Saprese of financial counseling who states she confirmed that the Medicaid application was received by DSS but has not been assigned to a worker so no pending Medicaid number is available at this time. Saprese to inform CSW of updates as they become available.  Madilyn Fireman, MSW, LCSW Transitions of Care  Clinical Social Worker II [phone]

## 2020-11-26 NOTE — Plan of Care (Signed)
  Problem: Clinical Measurements: Goal: Will remain free from infection Outcome: Progressing Goal: Respiratory complications will improve Outcome: Progressing Goal: Cardiovascular complication will be avoided Outcome: Progressing   Problem: Activity: Goal: Risk for activity intolerance will decrease Outcome: Progressing   

## 2020-11-26 NOTE — Progress Notes (Signed)
[facility] PROGRESS NOTE  Clifford [name] BUL:[id number] DOB: Feb 11, 1951 DOA: 10/14/2020 PCP: Pcp, No  Status:  Remains inpatient appropriate because:Unsafe d/c plan  Dispo: The patient is from: Home              Anticipated d/c is to: SNF              Patient currently is medically stable to d/c.   Difficult to place patient Yes              Barriers to discharge: Needs Medicaid number which is  pending-is from [place] if they were made aware of current admission- does not have Medicare   Level of care: Telemetry Medical   Code Status: DNR Family Communication:  DVT prophylaxis: Eliquis COVID vaccination status: Unknown  HPI: 70 y.o. 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  He was managed with supportive care-hospital course was complicated by worsening somnolence-further imaging studies showed cytotoxic edema with some mass-effect on the right lateral ventricle.  Neurology continued to follow closely-he was managed with continued supportive care.  He continued to exhibit significant left-sided hemiplegia/dysphagia-ultimately required G-tube placement.  Plans are for SNF on discharge.   Subjective: Awakened.  Will engage with conversation when questions are asked.  Occasionally perseverates.  Does appear to have some dysarthria and word finding difficulties.  During orientation questioning patient perseverated on I think for, I think for, I think for..  I therefore changed my approach to answer either yes or no.  Able to do this more successfully  Objective: Vitals:   11/26/20 0358 11/26/20 0759  BP: 121/69 120/77  Pulse: 78 78  Resp: 18 14  Temp: 98.6 F (37 C)   SpO2: 100% 100%     Intake/Output Summary (Last 24 hours) at 11/26/2020 0804 Last data filed at 11/26/2020 0400 Gross per 24 hour  Intake --  Output 1200 ml  Net -1200 ml   Filed Weights   11/23/20 0450 11/25/20 0500 11/26/20 0430  Weight: 82.1 kg 86.8 kg 81.6 kg    Exam:  Constitutional: NAD, calm, comfortable Respiratory: Anterior lung sounds clear to auscultation, no increased work of breathing, room air Cardiovascular: Pulse regular, normotensive, sounds, no edema Abdomen: no tenderness, distended, bowel sounds positive. LBM 9/22 Neurologic: CN 2-12 intact except for documented left visual field deficit with right gaze preference.  With documented dysphagia requiring PEG tube.  When tested patient reports that he is feeling sensation on the left but when I asked him to lift the arm that I was pinching he lifted the right arm.  Patient clearly has left-sided neglect with similar activity noted with left leg.  Also noted with left foot drop Psychiatric: Awake and oriented to name, place and reason for hospitalization.  Was not oriented to year   Stroke work-up: 8/3>> CT angio head: No intracranial large vessel occlusion 8/3>> CT angio neck: Minimal atherosclerotic change in the bulb internal carotid artery bulbs.  No stenosis. 8/4>> A1c: 5.9 8/4>> LDL: 75 8/4>> Echo: EF 32-12%, grade 1 diastolic dysfunction. 8/11>> CT head: Expected evolution of Rt MCA/PCA territory infarct w petechial hemorrhage/mild edema. 8/11>> MRI brain: Right MCA infarct-increased cytotoxic edema/petechial hemorrhage without midline shift. 8/16>> CT head: Evolving right posterior MCA territory infarct with increased cytotoxic edema.  Moderate mass-effect on the right lateral ventricle. 8/25>> MRI brain: Progressive swelling in the right hemisphere infarction-minimal petechial blood products.  Small acute infarctions of the inferior basal ganglia on the right.  8/12-10/2011>>LTM EEG: No seizures. 8/12:>>Spot EEG: No seizures     Assessment/Plan: Acute problems: Right MCA/PCA territory infarct with petechial hemorrhage/evolution-with mild edema/slight midline shift on repeat CT head on 8/16:  Stable with dense left hemiplegia and left-sided neglect-unable to ambulate at this time Work-up as above-on -Pradaxa prescribed prior to this hospitalization but was held due to petechial hemorrhage/severe dysphagia- now on Eliquis.   Noted with left foot drop and bedside staff state a splint has been ordered PT notes patient with left side spasticity more prominent and LLE so we will begin Skelaxin Currently patient requiring max assist, he has poor seated balance and he was unaware of incontinence of stool during most recent session   Possible focal seizure:  Continue Keppra/Topamax.   Headache:  Stable on Topamax.  Probably related to CVA/recent cytotoxic edema.   Fever on 9/10:  Resolved-cultures negative.   Completed 5-day course of cefepime.   HTN:  continue amlodipine/hydralazine/metoprolol.   HLD:  Continue Lipitor   AKI on CKD stage IIIa:  AKI likely hemodynamically mediated-resolved-creatinine back to baseline of 7.3-5.3   Rate 1 diastolic dysfunction HFpEF:  Euvolemic on exam. 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   Nutrition Status: Nutrition Problem: Inadequate oral intake Etiology: dysphagia Signs/Symptoms: NPO status Interventions: Tube feeding, Prostat   Obesity: Estimated body mass index is 30.12 kg/m as calculated from the following:   Height as of this encounter: 5\' 5"  (1.651 m).   Weight as of this encounter: 82.1 kg.     Data Reviewed: Basic Metabolic Panel: Recent Labs  Lab 11/24/20 0357  NA 136  K 3.8  CL 106  CO2 21*  GLUCOSE 138*  BUN 32*  CREATININE 1.40*  CALCIUM 8.7*   Liver Function  Tests: Recent Labs  Lab 11/24/20 0357  AST 35  ALT 45*  ALKPHOS 77  BILITOT 0.4  PROT 6.4*  ALBUMIN 2.6*   CBC: Recent Labs  Lab 11/24/20 0357  WBC 6.7  HGB 11.1*  HCT 34.5*  MCV 81.2  PLT 138*    CBG: Recent Labs  Lab 11/25/20 1643 11/25/20 2103 11/26/20 0048 11/26/20 0358 11/26/20 0758  GLUCAP 143* 115* 115* 111* 117*      Scheduled Meds:  amLODipine  10 mg Per Tube Daily   apixaban  5 mg Per Tube BID   atorvastatin  40 mg Per Tube Daily   feeding supplement (PROSource TF)  45 mL Per Tube BID   free water  200 mL Per Tube Q4H   hydrALAZINE  25 mg Per Tube QID   insulin aspart  0-9 Units Subcutaneous Q4H   levETIRAcetam  250 mg Per Tube BID   mouth rinse  15 mL Mouth Rinse BID   metaxalone  400 mg Per Tube TID   metoprolol tartrate  12.5 mg Per Tube BID   modafinil  100 mg Per Tube Daily   nystatin  5 mL Mouth/Throat QID   pantoprazole sodium  40 mg Per Tube Daily   saccharomyces boulardii  250 mg Per Tube BID   topiramate  50 mg Per Tube BID   Continuous Infusions:  feeding  supplement (JEVITY 1.5 CAL/FIBER) 1,000 mL (11/25/20 1027)    Principal Problem:   Left-sided weakness Active Problems:   Stroke [facility])   Essential hypertension   Mixed hyperlipidemia   Prediabetes   History of pulmonary embolism   Chronic anticoagulation   Nicotine dependence, cigarettes, uncomplicated   Petechial hemorrhage   Acute stroke due to ischemia [facility])   Advanced care planning/counseling discussion   Goals of care, counseling/discussion   Palliative care by specialist   Consultants: Neurology Interventional radiology  Procedures: Cortrack tube EEG PEG tube  Antibiotics: Cefepime 9/10 through 9/14 Vancomycin dose on 9/10   Time spent: 35 minutes    Erin Hearing ANP  [facility] 7 am - 330 pm/M-F for direct patient care and secure chat Please refer to Amion for contact info 43  days

## 2020-11-27 DIAGNOSIS — R531 Weakness: Secondary | ICD-10-CM | POA: Diagnosis not present

## 2020-11-27 DIAGNOSIS — I639 Cerebral infarction, unspecified: Secondary | ICD-10-CM | POA: Diagnosis not present

## 2020-11-27 LAB — GLUCOSE, CAPILLARY
Glucose-Capillary: 108 mg/dL — ABNORMAL HIGH (ref 70–99)
Glucose-Capillary: 117 mg/dL — ABNORMAL HIGH (ref 70–99)
Glucose-Capillary: 129 mg/dL — ABNORMAL HIGH (ref 70–99)
Glucose-Capillary: 129 mg/dL — ABNORMAL HIGH (ref 70–99)
Glucose-Capillary: 131 mg/dL — ABNORMAL HIGH (ref 70–99)
Glucose-Capillary: 135 mg/dL — ABNORMAL HIGH (ref 70–99)
Glucose-Capillary: 139 mg/dL — ABNORMAL HIGH (ref 70–99)

## 2020-11-27 NOTE — Progress Notes (Signed)
PROGRESS NOTE        PATIENT DETAILS Name: Clifford [name] Age: 70 y.o. Sex: male Date of Birth: 1951-02-27 Admit Date: 10/14/2020 Admitting Physician Vernelle Emerald, MD PCP:Pcp, No  Brief Narrative: Patient is a 70 y.o. 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  He was managed with supportive care-hospital course was complicated by worsening somnolence-further imaging studies showed cytotoxic edema with some mass-effect on the right lateral ventricle.  Neurology continued to follow closely-he was managed with continued supportive care.  He continued to exhibit significant left-sided hemiplegia/dysphagia-ultimately required G-tube placement.  Plans are for SNF on discharge.  Subjective: Lying comfortably in bed-able to answer simple questions appropriately.  Evaluated by SLP on 9/23 and started on a dysphagia 1 diet.  Objective: Vitals: Blood pressure 125/65, pulse 78, temperature 98.1 F (36.7 C), temperature source Oral, resp. rate 20, height 5\' 5"  (1.651 m), weight 81.6 kg, SpO2 100 %.   Exam: Gen Exam: Not in any distress. HEENT:atraumatic, normocephalic Chest: B/L clear to auscultation anteriorly CVS:S1S2 regular Abdomen:soft non tender, non distended Extremities:no edema Neurology: Left-sided hemiplegia Skin: no rash   Pertinent Labs/Radiology/Echo/EEG: 9/10 >>blood culture: No growth. 9/10 >>urine Culture: Insignificant growth  Stroke work-up: 8/3>> CT angio head: No intracranial large vessel occlusion 8/3>> CT angio neck: Minimal atherosclerotic change in the bulb internal carotid artery bulbs.  No stenosis. 8/4>> A1c: 5.9 8/4>> LDL: 75 8/4>> Echo: EF  88-32%, grade 1 diastolic dysfunction. 8/11>> CT head: Expected evolution of Rt MCA/PCA territory infarct w petechial hemorrhage/mild edema. 8/11>> MRI brain: Right MCA infarct-increased cytotoxic edema/petechial hemorrhage without midline shift. 8/16>> CT head: Evolving right posterior MCA territory infarct with increased cytotoxic edema.  Moderate mass-effect on the right lateral ventricle. 8/25>> MRI brain: Progressive swelling in the right hemisphere infarction-minimal petechial blood products.  Small acute infarctions of the inferior basal ganglia on the right.  8/12-10/2011>>LTM EEG: No seizures. 8/12:>>Spot EEG: No seizures  Assessment/Plan: Right MCA/PCA territory infarct with petechial hemorrhage/evolution-with mild edema/slight midline shift on repeat CT head on 8/16: Unchanged-significant left-sided hemiparesis-PEG tube in place.  Some mild improvement in dysphagia-dysphagia 1 diet started by SLP on 9/23.   Work-up as above-on Pradaxa prior to this hospitalization-which was held due to petechial hemorrhage/severe dysphagia-he is now back on Eliquis.  Awaiting SNF bed-remains medically stable for discharge.  Social work following.  Possible focal seizure: Continue Keppra/Topamax.  Headache: Stable on Topamax.  Probably related to CVA/cytotoxic edema.  Fever on 9/10: Resolved-cultures negative.  Completed 5-day course of cefepime.  HTN: BP stable-continue amlodipine/hydralazine/metoprolol.  HLD: Continue Lipitor  AKI on CKD stage IIIa: AKI likely hemodynamically mediated-resolved-creatinine back to baseline  HFpEF: Euvolemic on exam.  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  Nutrition Status: Nutrition Problem: Inadequate oral intake Etiology: dysphagia Signs/Symptoms: NPO status Interventions: Tube feeding, Prostat   Obesity: Estimated body mass index is 29.94 kg/m as calculated from the following:   Height as of this  encounter: 5\' 5"  (1.651 m).   Weight as of this encounter: 81.6 kg.    Procedures : 8/30>> G-tube placement by  IR Consults: DVT Prophylaxis: Eliquis Code Status: DNR Family Communication: None at bedside  Time spent: 15 minutes-Greater than 50% of this time was spent in counseling, explanation of diagnosis, planning of further management, and coordination of care.  Diet: Diet Order             DIET - DYS 1 Room service appropriate? Yes with Assist; Fluid consistency: Thin  Diet effective now                   Disposition Plan: Status is: Inpatient  Remains inpatient appropriate because:Inpatient level of care appropriate due to severity of illness  Dispo: The patient is from: Home              Anticipated d/c is to: SNF              Patient currently is medically stable to d/c.   Difficult to place patient No    Barriers to [facility] Bed  Antimicrobial agents: Anti-infectives (From admission, onward)    Start     Dose/Rate Route Frequency Ordered Stop   11/14/20 1000  vancomycin (VANCOREADY) IVPB 750 mg/150 mL  Status:  Discontinued        750 mg 150 mL/hr over 60 Minutes Intravenous Every 24 hours 11/13/20 0859 11/14/20 1836   11/13/20 1000  ceFEPIme (MAXIPIME) 2 g in sodium chloride 0.9 % 100 mL IVPB        2 g 200 mL/hr over 30 Minutes Intravenous Every 12 hours 11/13/20 0859 11/17/20 2203   11/13/20 0945  vancomycin (VANCOREADY) IVPB 1250 mg/250 mL        1,250 mg 166.7 mL/hr over 90 Minutes Intravenous  Once 11/13/20 0859 11/13/20 1441   11/02/20 0000  ceFAZolin (ANCEF) IVPB 2g/100 mL premix        2 g 200 mL/hr over 30 Minutes Intravenous To Radiology 11/01/20 1054 11/02/20 1040        MEDICATIONS: Scheduled Meds:  amLODipine  10 mg Per Tube Daily   apixaban  5 mg Per Tube BID   atorvastatin  40 mg Per Tube Daily   feeding supplement (PROSource TF)  45 mL Per Tube BID   free water  200 mL Per Tube Q4H   hydrALAZINE  25 mg Per Tube  QID   insulin aspart  0-9 Units Subcutaneous Q4H   levETIRAcetam  250 mg Per Tube BID   mouth rinse  15 mL Mouth Rinse BID   metaxalone  400 mg Per Tube TID   metoprolol tartrate  12.5 mg Per Tube BID   modafinil  100 mg Per Tube Daily   nystatin  5 mL Mouth/Throat QID   pantoprazole sodium  40 mg Per Tube Daily   saccharomyces boulardii  250 mg Per Tube BID   topiramate  50 mg Per Tube BID   Continuous Infusions:  feeding supplement (JEVITY 1.5 CAL/FIBER) 1,000 mL (11/27/20 0750)   PRN Meds:.acetaminophen **OR** acetaminophen (TYLENOL) oral liquid 160 mg/5 mL **OR** acetaminophen, diclofenac Sodium, guaiFENesin, labetalol, lip balm, ondansetron (ZOFRAN) IV, polyethylene glycol   I have personally reviewed following labs and imaging studies  LABORATORY DATA: CBC: Recent Labs  Lab 11/24/20 0357  WBC 6.7  HGB 11.1*  HCT 34.5*  MCV 81.2  PLT 138*     Basic Metabolic Panel: Recent Labs  Lab 11/24/20 0357  NA 136  K 3.8  CL 106  CO2 21*  GLUCOSE 138*  BUN 32*  CREATININE 1.40*  CALCIUM  8.7*     GFR: Estimated Creatinine Clearance: 46.9 mL/min (A) (by C-G formula based on SCr of 1.4 mg/dL (H)).  Liver Function Tests: Recent Labs  Lab 11/24/20 0357  AST 35  ALT 45*  ALKPHOS 77  BILITOT 0.4  PROT 6.4*  ALBUMIN 2.6*    No results for input(s): LIPASE, AMYLASE in the last 168 hours. No results for input(s): AMMONIA in the last 168 hours.  Coagulation Profile: No results for input(s): INR, PROTIME in the last 168 hours.  Cardiac Enzymes: No results for input(s): CKTOTAL, CKMB, CKMBINDEX, TROPONINI in the last 168 hours.  BNP (last 3 results) No results for input(s): PROBNP in the last 8760 hours.  Lipid Profile: No results for input(s): CHOL, HDL, LDLCALC, TRIG, CHOLHDL, LDLDIRECT in the last 72 hours.  Thyroid Function Tests: No results for input(s): TSH, T4TOTAL, FREET4, T3FREE, THYROIDAB in the last 72 hours.  Anemia Panel: No results for  input(s): VITAMINB12, FOLATE, FERRITIN, TIBC, IRON, RETICCTPCT in the last 72 hours.  Urine analysis:    Component Value Date/Time   COLORURINE YELLOW 11/13/2020 0824   APPEARANCEUR CLEAR 11/13/2020 0824   LABSPEC 1.015 11/13/2020 0824   PHURINE 6.5 11/13/2020 0824   GLUCOSEU NEGATIVE 11/13/2020 0824   HGBUR NEGATIVE 11/13/2020 0824   BILIRUBINUR NEGATIVE 11/13/2020 0824   KETONESUR NEGATIVE 11/13/2020 0824   PROTEINUR NEGATIVE 11/13/2020 0824   NITRITE NEGATIVE 11/13/2020 0824   LEUKOCYTESUR NEGATIVE 11/13/2020 0824    Sepsis Labs: Lactic Acid, Venous No results found for: LATICACIDVEN  MICROBIOLOGY: No results found for this or any previous visit (from the past 240 hour(s)).   RADIOLOGY STUDIES/RESULTS: No results found.   LOS: 60 days   Oren Binet, MD  [facility]    To contact the attending provider between 7A-7P or the covering provider during after hours 7P-7A, please log into the web site [email or web address] and access using universal [place] password for that web site. If you do not have the password, please call the hospital operator.  11/27/2020, 3:06 PM

## 2020-11-27 NOTE — Progress Notes (Signed)
   11/27/20 1727  Vitals  BP (!) 123/55  MAP (mmHg) 72  BP Location Right Arm  BP Method Automatic  Patient Position (if appropriate) Lying  ECG Heart Rate (!) 110  Resp 20  MEWS COLOR  MEWS Score Color Green  Oxygen Therapy  SpO2 99 %  O2 Device Room Air  MEWS Score  MEWS Temp 0  MEWS Systolic 0  MEWS Pulse 1  MEWS RR 0  MEWS LOC 0  MEWS Score 1   Patient is scheduled for 25 mg hydralazine, secure chat sent to Dr. Sloan Leiter to advise on dose. Awaiting response.

## 2020-11-27 NOTE — Progress Notes (Addendum)
   11/27/20 1758  Vitals  BP 133/67  MAP (mmHg) 88  BP Location Left Arm  BP Method Automatic  Patient Position (if appropriate) Lying  Pulse Rate Source Monitor  ECG Heart Rate 99  Resp 15  MEWS COLOR  MEWS Score Color Green  Oxygen Therapy  SpO2 100 %  MEWS Score  MEWS Temp 0  MEWS Systolic 0  MEWS Pulse 0  MEWS RR 0  MEWS LOC 0  MEWS Score 0   Recheck BP and HR, administered 25 mg hydralazine as ordered.

## 2020-11-28 DIAGNOSIS — R531 Weakness: Secondary | ICD-10-CM | POA: Diagnosis not present

## 2020-11-28 LAB — GLUCOSE, CAPILLARY
Glucose-Capillary: 109 mg/dL — ABNORMAL HIGH (ref 70–99)
Glucose-Capillary: 127 mg/dL — ABNORMAL HIGH (ref 70–99)
Glucose-Capillary: 127 mg/dL — ABNORMAL HIGH (ref 70–99)
Glucose-Capillary: 128 mg/dL — ABNORMAL HIGH (ref 70–99)
Glucose-Capillary: 140 mg/dL — ABNORMAL HIGH (ref 70–99)
Glucose-Capillary: 145 mg/dL — ABNORMAL HIGH (ref 70–99)

## 2020-11-28 NOTE — Progress Notes (Signed)
PROGRESS NOTE        PATIENT DETAILS Name: Clifford [name] Age: 70 y.o. Sex: male Date of Birth: Sep 08, 1950 Admit Date: 10/14/2020 Admitting Physician Vernelle Emerald, MD PCP:Pcp, No  Brief Narrative: Patient is a 70 y.o. 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  He was managed with supportive care-hospital course was complicated by worsening somnolence-further imaging studies showed cytotoxic edema with some mass-effect on the right lateral ventricle.  Neurology continued to follow closely-he was managed with continued supportive care.  He continued to exhibit significant left-sided hemiplegia/dysphagia-ultimately required G-tube placement.  Plans are for SNF on discharge.  Subjective:  Pain in bed in no distress would answer questions in yes or no, denies any headache chest or abdominal pain.  No fevers.  Objective: Vitals: Blood pressure 130/66, pulse 78, temperature 99 F (37.2 C), temperature source Oral, resp. rate 19, height 5\' 5"  (1.651 m), weight 84.6 kg, SpO2 100 %.   Exam:  Awake Alert, answering in 1-2 words, moving right side, severe left-sided hemiparesis with contracture in the left arm, overall appears weak and deconditioned [place].AT,PERRAL Supple Neck,No JVD, No cervical lymphadenopathy appriciated.  Symmetrical Chest wall movement, Good air movement bilaterally, CTAB RRR,No Gallops, Rubs or new Murmurs, No Parasternal Heave +ve B.Sounds, Abd Soft, PEG tube in place. Mitten in right hand, splint in left arm, both legs in surgical boots   Pertinent Labs/Radiology/Echo/EEG: 9/10 >>blood culture: No growth. 9/10 >>urine Culture: Insignificant  growth  Stroke work-up: 8/3>> CT angio head: No intracranial large vessel occlusion 8/3>> CT angio neck: Minimal atherosclerotic change in the bulb internal carotid artery bulbs.  No stenosis. 8/4>> A1c: 5.9 8/4>> LDL: 75 8/4>> Echo: EF 62-22%, grade 1 diastolic dysfunction. 8/11>> CT head: Expected evolution of Rt MCA/PCA territory infarct w petechial hemorrhage/mild edema. 8/11>> MRI brain: Right MCA infarct-increased cytotoxic edema/petechial hemorrhage without midline shift. 8/16>> CT head: Evolving right posterior MCA territory infarct with increased cytotoxic edema.  Moderate mass-effect on the right lateral ventricle. 8/25>> MRI brain: Progressive swelling in the right hemisphere infarction-minimal petechial blood products.  Small acute infarctions of the inferior basal ganglia on the right.  8/12-10/2011>>LTM EEG: No seizures. 8/12:>>Spot EEG: No seizures  Assessment/Plan:  Right MCA/PCA territory infarct with petechial hemorrhage/evolution-with mild edema/slight midline shift on repeat CT head on 8/16: Unchanged-significant left-sided hemiparesis-PEG tube in place.  Some mild improvement in dysphagia-dysphagia 1 diet started by SLP on 9/23, remains high risk for aspiration will check chest x-ray morning of 11/29/2020.  Monitor closely.   Work-up as above-on Pradaxa prior to this hospitalization-which was held due to petechial hemorrhage/severe dysphagia-he is now back on Eliquis.  Awaiting SNF bed-remains medically stable for discharge.  Social work following.  Possible focal seizure: Continue Keppra/Topamax.  Headache: Stable on Topamax.  Probably related to CVA/cytotoxic edema.  Fever on 9/10: Resolved-cultures negative.  Completed 5-day course of cefepime.  HTN: BP stable-continue amlodipine/hydralazine/metoprolol.  HLD: Continue Lipitor  AKI on CKD stage IIIa: AKI likely hemodynamically mediated-resolved-creatinine back to baseline  HFpEF: Euvolemic on exam.  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  Nutrition Status: Nutrition Problem: Inadequate oral intake Etiology: dysphagia Signs/Symptoms: NPO status Interventions: Tube feeding, Prostat   Obesity: Estimated body mass index is 31.04 kg/m as calculated from the following:   Height as of this encounter: 5\' 5"  (1.651 m).   Weight as of this encounter: 84.6 kg.    Procedures : 8/30>> G-tube placement by IR Consults: DVT Prophylaxis: Eliquis Code Status: DNR Family Communication: None at bedside  Time spent: 15 minutes-Greater than 50% of this time was spent in counseling, explanation of diagnosis, planning of further management, and coordination of care.  Diet: Diet Order             DIET - DYS 1 Room service appropriate? Yes with Assist; Fluid consistency: Thin  Diet effective now                   Disposition Plan: Status is: Inpatient  Remains inpatient appropriate because:Inpatient level of care appropriate due to severity of illness  Dispo: The patient is from: Home              Anticipated d/c is to: SNF              Patient currently is medically stable to d/c.   Difficult to place patient No    Barriers to Discharge:Awaiting SNF Bed  Antimicrobial agents: Anti-infectives (From admission, onward)    Start     Dose/Rate Route Frequency Ordered Stop   11/14/20 1000  vancomycin (VANCOREADY) IVPB 750 mg/150 mL  Status:  Discontinued        750 mg 150 mL/hr over 60 Minutes Intravenous Every 24 hours 11/13/20 0859 11/14/20 1836   11/13/20 1000  ceFEPIme (MAXIPIME) 2 g in sodium chloride 0.9 % 100 mL IVPB        2 g 200 mL/hr over 30 Minutes Intravenous Every 12 hours 11/13/20 0859 11/17/20 2203   11/13/20 0945  vancomycin (VANCOREADY) IVPB 1250 mg/250 mL        1,250 mg 166.7 mL/hr over 90 Minutes Intravenous  Once 11/13/20 0859 11/13/20 1441   11/02/20 0000  ceFAZolin (ANCEF) IVPB 2g/100 mL premix        2 g 200  mL/hr over 30 Minutes Intravenous To Radiology 11/01/20 1054 11/02/20 1040        MEDICATIONS: Scheduled Meds:  amLODipine  10 mg Per Tube Daily   apixaban  5 mg Per Tube BID   atorvastatin  40 mg Per Tube Daily   feeding supplement (PROSource TF)  45 mL Per Tube BID   free water  200 mL Per Tube Q4H   hydrALAZINE  25 mg Per Tube QID   insulin aspart  0-9 Units Subcutaneous Q4H   levETIRAcetam  250 mg Per Tube BID   mouth rinse  15 mL Mouth Rinse BID   metaxalone  400 mg Per Tube TID   metoprolol tartrate  12.5 mg Per Tube BID   modafinil  100 mg Per Tube Daily   nystatin  5 mL Mouth/Throat QID   pantoprazole sodium  40 mg Per Tube Daily   saccharomyces boulardii  250 mg Per Tube BID   topiramate  50 mg Per Tube BID   Continuous Infusions:  feeding supplement (JEVITY 1.5 CAL/FIBER) 1,000 mL (11/28/20 0503)   PRN Meds:.acetaminophen **OR** acetaminophen (TYLENOL) oral liquid 160 mg/5 mL **OR** acetaminophen, diclofenac Sodium, guaiFENesin, labetalol, lip balm, ondansetron (ZOFRAN) IV, polyethylene glycol   I have personally reviewed following labs and imaging studies  LABORATORY DATA:  Recent Labs  Lab 11/24/20 0357  WBC 6.7  HGB 11.1*  HCT 34.5*  PLT 138*  MCV 81.2  MCH 26.1  MCHC 32.2  RDW 15.4    Recent Labs  Lab 11/24/20 0357  NA 136  K 3.8  CL 106  CO2 21*  GLUCOSE 138*  BUN 32*  CREATININE 1.40*  CALCIUM 8.7*  AST 35  ALT 45*  ALKPHOS 77  BILITOT 0.4  ALBUMIN 2.6*    RADIOLOGY STUDIES/RESULTS: No results found.   LOS: 45 days   Signature  Lala Lund M.D on 11/28/2020 at 11:02 AM   -  To page go to [email or web address]

## 2020-11-29 ENCOUNTER — Inpatient Hospital Stay (HOSPITAL_COMMUNITY): Payer: No Typology Code available for payment source

## 2020-11-29 DIAGNOSIS — R531 Weakness: Secondary | ICD-10-CM | POA: Diagnosis not present

## 2020-11-29 LAB — CBC
HCT: 32.7 % — ABNORMAL LOW (ref 39.0–52.0)
Hemoglobin: 10.5 g/dL — ABNORMAL LOW (ref 13.0–17.0)
MCH: 25.9 pg — ABNORMAL LOW (ref 26.0–34.0)
MCHC: 32.1 g/dL (ref 30.0–36.0)
MCV: 80.7 fL (ref 80.0–100.0)
Platelets: 159 10*3/uL (ref 150–400)
RBC: 4.05 MIL/uL — ABNORMAL LOW (ref 4.22–5.81)
RDW: 15.4 % (ref 11.5–15.5)
WBC: 8 10*3/uL (ref 4.0–10.5)
nRBC: 0 % (ref 0.0–0.2)

## 2020-11-29 LAB — COMPREHENSIVE METABOLIC PANEL
ALT: 36 U/L (ref 0–44)
AST: 32 U/L (ref 15–41)
Albumin: 2.6 g/dL — ABNORMAL LOW (ref 3.5–5.0)
Alkaline Phosphatase: 74 U/L (ref 38–126)
Anion gap: 8 (ref 5–15)
BUN: 26 mg/dL — ABNORMAL HIGH (ref 8–23)
CO2: 21 mmol/L — ABNORMAL LOW (ref 22–32)
Calcium: 8.5 mg/dL — ABNORMAL LOW (ref 8.9–10.3)
Chloride: 105 mmol/L (ref 98–111)
Creatinine, Ser: 1.33 mg/dL — ABNORMAL HIGH (ref 0.61–1.24)
GFR, Estimated: 57 mL/min — ABNORMAL LOW (ref >60)
Glucose, Bld: 117 mg/dL — ABNORMAL HIGH (ref 70–99)
Potassium: 4 mmol/L (ref 3.5–5.1)
Sodium: 134 mmol/L — ABNORMAL LOW (ref 135–145)
Total Bilirubin: 0.5 mg/dL (ref 0.3–1.2)
Total Protein: 6.2 g/dL — ABNORMAL LOW (ref 6.5–8.1)

## 2020-11-29 LAB — GLUCOSE, CAPILLARY
Glucose-Capillary: 114 mg/dL — ABNORMAL HIGH (ref 70–99)
Glucose-Capillary: 116 mg/dL — ABNORMAL HIGH (ref 70–99)
Glucose-Capillary: 123 mg/dL — ABNORMAL HIGH (ref 70–99)
Glucose-Capillary: 125 mg/dL — ABNORMAL HIGH (ref 70–99)
Glucose-Capillary: 128 mg/dL — ABNORMAL HIGH (ref 70–99)
Glucose-Capillary: 135 mg/dL — ABNORMAL HIGH (ref 70–99)

## 2020-11-29 LAB — MAGNESIUM: Magnesium: 1.8 mg/dL (ref 1.7–2.4)

## 2020-11-29 MED ORDER — FREE WATER
200.0000 mL | Status: DC
  Administered 2020-11-29 – 2020-12-14 (×90): 200 mL

## 2020-11-29 MED ORDER — FREE WATER: 300.0000 mL | Status: DC

## 2020-11-29 NOTE — Progress Notes (Signed)
Physical Therapy Treatment Patient Details Name: Clifford [name] MRN: [id number] DOB: 12-01-1950 Today's Date: 11/29/2020   History of Present Illness 70 yo male presented to ED on 8/11 for R sided headache and worsening L UE weakness. Patient recently admitted 8/3-8/5 for R CVA with L visual deficits and L sided weakness. MRI with mild enlargement of R MCA/border zone infarct since 10/07/20 MRI and increased cytotoxic edema and petechial hemorrhage without midline shift. Palliative Care consulted and in detailed family discussions, with prognosis provided by Neurology (more likely able to regain ability to talk and participate in self cares, unlikely to regain ability to walk, uncertain if able to regain ability to swallow, very unlikely to improve to full independence), it was decided to place PEG and pursue SNF rehab, PEG was placed 8/30. 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    PT Comments    Patient received in bed, pleasant and cooperative. More alert and awake today and able to participate in simple conversations/follow simple commands, but continues to have poor insight into deficits and struggles with sequencing. Continues to have left visual deficits, unable to correct identify # of fingers PT was holding up. Also continues to have strong posterior lean and really pushes himself hard to the left when I allowed his right hand to rest on the bed. Has a very hard time following cues and sequencing during novel, functional activities. Left in bed positioned to comfort with all needs met, bed alarm active. Will continue efforts.     Recommendations for follow up therapy are one component of a multi-disciplinary discharge planning process, led by the attending physician.  Recommendations may be updated based on patient status, additional functional criteria and insurance authorization.  Follow Up Recommendations  SNF;Supervision/Assistance - 24 hour     Equipment Recommendations  Hospital  bed;3in1 (PT);Wheelchair cushion (measurements PT);Wheelchair (measurements PT);Other (comment) (hoyer lift and pads)    Recommendations for Other Services       Precautions / Restrictions Precautions Precautions: Fall Precaution Comments: L visual field deficit, R gaze preference, L inattention, L hemparesis; G-tube and abdominal binder; penile skin breakdown from long term condom cath use/moisture Required Braces or Orthoses: Other Brace Other Brace: prevalon boots, L elbow splint and resting hand splint Restrictions Weight Bearing Restrictions: No     Mobility  Bed Mobility Overal bed mobility: Needs Assistance Bed Mobility: Supine to Sit;Sit to Supine     Supine to sit: Max assist;+2 for physical assistance;HOB elevated Sit to supine: Max assist;+2 for physical assistance   General bed mobility comments: heavy MaxAx2 to get from supine to EOB even with HOB elevated; totalA to scoot forward to edge of bed and get feet on floor. Tends to push himself posteriorly and to the left.    Transfers                 General transfer comment: Deferred due to weakness and poor balance sitting EOB  Ambulation/Gait             General Gait Details: deferred   Stairs             Wheelchair Mobility    Modified Rankin (Stroke Patients Only) Modified Rankin (Stroke Patients Only) Pre-Morbid Rankin Score: Moderately severe disability Modified Rankin: Severe disability     Balance Overall balance assessment: Needs assistance Sitting-balance support: Single extremity supported;Feet supported Sitting balance-Leahy Scale: Poor Sitting balance - Comments: MaxA for balance given hard posterior lean and tendency to  push himself to the left when R hand placed on bed Postural control: Posterior lean;Left lateral lean                                  Cognition Arousal/Alertness: Awake/alert Behavior During Therapy: Flat affect Overall Cognitive Status:  No family/caregiver present to determine baseline cognitive functioning Area of Impairment: Orientation;Memory;Following commands;Safety/judgement;Awareness;Problem solving                 Orientation Level: Person;Place;Time;Disoriented to Current Attention Level: Sustained Memory: Decreased short-term memory;Decreased recall of precautions Following Commands: Follows one step commands inconsistently;Follows one step commands with increased time;Follows multi-step commands inconsistently Safety/Judgement: Decreased awareness of safety;Decreased awareness of deficits Awareness: Intellectual Problem Solving: Slow processing;Decreased initiation;Requires verbal cues;Requires tactile cues;Difficulty sequencing General Comments: awake and alert today, A&Ox3 but only very vaguely able to tell me part of why he is in the hospital. Can attend to simple conversations and commands, but very limited insight into deficits today, unaware of bowel incontinence. Will intermittently ask questions that make me question his cognition- stated "have we met before?" in the middle of a transfer when PT/tech was already introduced      Exercises      General Comments        Pertinent Vitals/Pain Pain Assessment: Faces Faces Pain Scale: No hurt Pain Intervention(s): Limited activity within patient's tolerance;Monitored during session    Home Living                      Prior Function            PT Goals (current goals can now be found in the care plan section) Acute Rehab PT Goals Patient Stated Goal: None stated PT Goal Formulation: With patient Time For Goal Achievement: 12/06/20 Potential to Achieve Goals: Poor Progress towards PT goals: Progressing toward goals (very slowly)    Frequency    Min 2X/week      PT Plan Current plan remains appropriate    Co-evaluation              AM-PAC PT "6 Clicks" Mobility   Outcome Measure  Help needed turning from your back  to your side while in a flat bed without using bedrails?: A Lot Help needed moving from lying on your back to sitting on the side of a flat bed without using bedrails?: Total Help needed moving to and from a bed to a chair (including a wheelchair)?: Total Help needed standing up from a chair using your arms (e.g., wheelchair or bedside chair)?: Total Help needed to walk in hospital room?: Total Help needed climbing 3-5 steps with a railing? : Total 6 Click Score: 7    End of Session   Activity Tolerance: Patient tolerated treatment well Patient left: in bed;with call Sharron/phone within reach;with bed alarm set Nurse Communication: Mobility status PT Visit Diagnosis: Muscle weakness (generalized) (M62.81);Other abnormalities of gait and mobility (R26.89);Hemiplegia and hemiparesis Hemiplegia - Right/Left: Left     Time: 9163-8466 PT Time Calculation (min) (ACUTE ONLY): 19 min  Charges:  $Therapeutic Activity: 8-22 mins                    Windell Norfolk, DPT, PN2   Supplemental Physical Therapist [place]    Pager [phone] Acute Rehab Office [phone]

## 2020-11-29 NOTE — Progress Notes (Signed)
Speech Language Pathology Treatment: Dysphagia;Cognitive-Linquistic  Patient Details Name: Clifford [name] MRN: [id number] DOB: 01/30/1951 Today's Date: 11/29/2020 Time: 4970-2637 SLP Time Calculation (min) (ACUTE ONLY): 20 min  Assessment / Plan / Recommendation Clinical Impression  Pt seen for skilled ST intervention targeting goals for swallow function and safety, as well as cognitive linguistic deficits. Pt was awake and alert upon arrival of SLP. Nurse tech assisted with repositioning pt to be upright. Oral care was completed with suction. Pt then accepted trials of magic cup and straw sips of water. Left anterior leakage observed following sips of water. No overt s/s aspiration on any PO trial. Pt required mod-max multimodal cues to look left of midline. He was able to make eye contact x2. Pt was not oriented to year or month, but was able to recall this information after a delay. Will continue to follow pt acutely. Recommend ongoing ST services at next venue of care.    HPI HPI: 70yo male admitted 10/14/20 with right side headache and LUE weakness. 10/19/20 pt was noted to have increased LUE weakness. 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  Recommendations may be updated based on patient status, additional functional criteria and insurance authorization.    Recommendations  Diet recommendations: Dysphagia 1 (puree);Thin liquid Liquids provided via: Straw Medication Administration: Via alternative means Supervision: Full supervision/cueing for compensatory strategies Compensations: Minimize environmental distractions Postural Changes and/or Swallow Maneuvers:  Seated upright 90 degrees;Upright 30-60 min after meal                Oral Care Recommendations: Oral care BID Follow up Recommendations: Skilled Nursing facility SLP Visit Diagnosis: Dysphagia, oropharyngeal phase (R13.12);Cognitive communication deficit (R41.841) Plan: Continue with current plan of care       Clifford [name], [facility], [place] Speech Language Pathologist Office: [phone]  Shonna Chock  11/29/2020, 10:09 AM

## 2020-11-29 NOTE — Progress Notes (Signed)
[facility] PROGRESS NOTE  Clifford [name] XNA:[id number] DOB: [birth year] DOA: 10/14/2020 PCP: Pcp, No  Status:  Remains inpatient appropriate because:Unsafe d/c plan  Dispo: The patient is from: Home              Anticipated d/c is to: SNF              Patient currently is medically stable to d/c.   Difficult to place patient Yes              Barriers to discharge: Needs Medicaid number which is  pending-is from [place] if they were made aware of current admission- does not have Medicare   Level of care: Telemetry Medical   Code Status: DNR Family Communication:  DVT prophylaxis: Eliquis COVID vaccination status: Unknown  HPI: 70 y.o. 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  He was managed with supportive care-hospital course was complicated by worsening somnolence-further imaging studies showed cytotoxic edema with some mass-effect on the right lateral ventricle.  Neurology continued to follow closely-he was managed with continued supportive care.  He continued to exhibit significant left-sided hemiplegia/dysphagia-ultimately required G-tube placement.  Plans are for SNF on discharge.   Subjective: Awake.  No specific complaints verbalized and asked.  Objective: Vitals:   11/29/20 0500 11/29/20 0716  BP: 129/80   Pulse:  77  Resp:    Temp: 98 F (36.7 C) 98.1 F (36.7 C)  SpO2:      Intake/Output Summary (Last 24 hours) at 11/29/2020 0800 Last data filed at 11/29/2020 0057 Gross per 24 hour  Intake 230 ml  Output --  Net 230 ml   Filed Weights   11/26/20 0430 11/28/20 0453 11/29/20 0500  Weight: 81.6 kg 84.6 kg 87.9 kg    Exam:  Constitutional: NAD, calm,  comfortable Respiratory: Lung sounds clear to auscultation.  Stable on room air.  Normal pulse oximetry reading Cardiovascular: Heart sounds S1-S2, normotensive, regular pulse Abdomen: Soft, nontender and non-distended, bowel sounds positive. LBM 9/25 Neurologic: CN 2-12 intact except for documented left visual field deficit with right gaze preference.  Continued left side neglect with associated hemiplegia.  RUE in splint.  Right foot with foot drop. Psychiatric: Awake and oriented to name, place and reason for hospitalization.  Was not oriented to year   Stroke work-up: 8/3>> CT angio head: No intracranial large vessel occlusion 8/3>> CT angio neck: Minimal atherosclerotic change in the bulb internal carotid artery bulbs.  No stenosis. 8/4>> A1c: 5.9 8/4>> LDL: 75 8/4>> Echo: EF 54-27%, grade 1 diastolic dysfunction. 8/11>> CT head: Expected evolution of Rt MCA/PCA territory infarct w petechial hemorrhage/mild edema. 8/11>> MRI brain: Right MCA infarct-increased cytotoxic edema/petechial hemorrhage without midline shift. 8/16>> CT head: Evolving right posterior MCA territory infarct with increased cytotoxic edema.  Moderate mass-effect on the right lateral ventricle. 8/25>> MRI brain: Progressive swelling in the right hemisphere infarction-minimal petechial blood products.  Small acute infarctions of the inferior basal ganglia on the right.  8/12-10/2011>>LTM EEG: No seizures. 8/12:>>Spot EEG: No seizures    Assessment/Plan: Acute problems: Right MCA/PCA territory infarct with petechial hemorrhage/evolution-with mild edema/slight midline shift on repeat CT head on 8/16:  Stable with dense left hemiplegia and left-sided neglect-unable to ambulate at this time Work-up as above-on -Pradaxa prescribed prior  to this hospitalization but was held due to petechial hemorrhage/severe dysphagia- now on Eliquis.   Noted with left foot drop continue splint Continue Skelaxin spasticity Currently  patient requiring max assist, he has poor seated balance and he was unaware of incontinence of stool during most recent session SLP initiated dysphagia 1 diet on 9/23.  Due to high risk for aspiration attending ordered chest x-ray today which was unremarkable without any acute changes   Possible focal seizure:  Continue Keppra/Topamax.   Headache:  Stable on Topamax.  Probably related to CVA/recent cytotoxic edema.   Fever on 9/10:  Resolved-cultures negative.   Completed 5-day course of cefepime.   HTN:  continue amlodipine/hydralazine/metoprolol.   HLD:  Continue Lipitor   AKI on CKD stage IIIa:  AKI likely hemodynamically mediated-resolved-creatinine back to baseline of 6.6-5.9   Rate 1 diastolic dysfunction HFpEF:  Euvolemic on exam. 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   Nutrition Status: Nutrition Problem: Inadequate oral intake Etiology: dysphagia Signs/Symptoms: NPO status Interventions: Tube feeding, Prostat   Obesity: Estimated body mass index is 30.12 kg/m as calculated from the following:   Height as of this encounter: 5\' 5"  (1.651 m).   Weight as of this encounter: 82.1 kg.     Data Reviewed: Basic Metabolic Panel: Recent Labs  Lab 11/24/20 0357 11/29/20 0009  NA 136 134*  K 3.8 4.0  CL 106 105  CO2 21* 21*  GLUCOSE 138* 117*  BUN 32* 26*  CREATININE 1.40* 1.33*  CALCIUM 8.7* 8.5*  MG  --  1.8   Liver Function Tests: Recent Labs  Lab 11/24/20 0357 11/29/20 0009  AST 35 32  ALT 45* 36  ALKPHOS 77 74  BILITOT 0.4 0.5  PROT 6.4* 6.2*  ALBUMIN 2.6* 2.6*   CBC: Recent Labs  Lab 11/24/20 0357 11/29/20 0009  WBC 6.7 8.0  HGB 11.1* 10.5*  HCT 34.5* 32.7*  MCV 81.2 80.7  PLT 138* 159    CBG: Recent Labs  Lab 11/28/20 1612 11/28/20 1948 11/28/20 2330  11/29/20 0355 11/29/20 0752  GLUCAP 145* 109* 127* 125* 123*      Scheduled Meds:  amLODipine  10 mg Per Tube Daily   apixaban  5 mg Per Tube BID   atorvastatin  40 mg Per Tube Daily   feeding supplement (PROSource TF)  45 mL Per Tube BID   free water  200 mL Per Tube Q4H   hydrALAZINE  25 mg Per Tube QID   insulin aspart  0-9 Units Subcutaneous Q4H   levETIRAcetam  250 mg Per Tube BID   mouth rinse  15 mL Mouth Rinse BID   metaxalone  400 mg Per Tube TID   metoprolol tartrate  12.5 mg Per Tube BID   modafinil  100 mg Per Tube Daily   nystatin  5 mL Mouth/Throat QID   pantoprazole sodium  40 mg Per Tube Daily   saccharomyces boulardii  250 mg Per Tube BID   topiramate  50 mg Per Tube BID   Continuous Infusions:  feeding supplement (JEVITY 1.5 CAL/FIBER) 1,000 mL (11/29/20 0500)    Principal Problem:   Left-sided weakness Active Problems:   Stroke [facility])   Essential hypertension   Mixed hyperlipidemia   Prediabetes   History of pulmonary embolism   Chronic anticoagulation   Nicotine  dependence, cigarettes, uncomplicated   Petechial hemorrhage   Acute stroke due to ischemia [facility])   Advanced care planning/counseling discussion   Goals of care, counseling/discussion   Palliative care by specialist   Consultants: Neurology Interventional radiology  Procedures: Cortrack tube EEG PEG tube  Antibiotics: Cefepime 9/10 through 9/14 Vancomycin dose on 9/10   Time spent: 35 minutes    Erin Hearing ANP  [facility] 7 am - 330 pm/M-F for direct patient care and secure chat Please refer to Amion for contact info 46  days

## 2020-11-29 NOTE — Progress Notes (Addendum)
2:30pm: CSW had NP sign incompetency letter and returned it to [employer] of financial counseling.  9:25am CSW attempted to reach patient's brother Marland Kitchen without success - a voicemail was left requesting a return call.  Madilyn Fireman, MSW, LCSW Transitions of Care  Clinical Social Worker II [phone]

## 2020-11-30 DIAGNOSIS — R531 Weakness: Secondary | ICD-10-CM | POA: Diagnosis not present

## 2020-11-30 LAB — COMPREHENSIVE METABOLIC PANEL
ALT: 33 U/L (ref 0–44)
AST: 33 U/L (ref 15–41)
Albumin: 2.7 g/dL — ABNORMAL LOW (ref 3.5–5.0)
Alkaline Phosphatase: 83 U/L (ref 38–126)
Anion gap: 8 (ref 5–15)
BUN: 29 mg/dL — ABNORMAL HIGH (ref 8–23)
CO2: 21 mmol/L — ABNORMAL LOW (ref 22–32)
Calcium: 8.8 mg/dL — ABNORMAL LOW (ref 8.9–10.3)
Chloride: 105 mmol/L (ref 98–111)
Creatinine, Ser: 1.29 mg/dL — ABNORMAL HIGH (ref 0.61–1.24)
GFR, Estimated: 59 mL/min — ABNORMAL LOW (ref >60)
Glucose, Bld: 133 mg/dL — ABNORMAL HIGH (ref 70–99)
Potassium: 3.8 mmol/L (ref 3.5–5.1)
Sodium: 134 mmol/L — ABNORMAL LOW (ref 135–145)
Total Bilirubin: 0.6 mg/dL (ref 0.3–1.2)
Total Protein: 6.4 g/dL — ABNORMAL LOW (ref 6.5–8.1)

## 2020-11-30 LAB — GLUCOSE, CAPILLARY
Glucose-Capillary: 147 mg/dL — ABNORMAL HIGH (ref 70–99)
Glucose-Capillary: 123 mg/dL — ABNORMAL HIGH (ref 70–99)
Glucose-Capillary: 119 mg/dL — ABNORMAL HIGH (ref 70–99)
Glucose-Capillary: 134 mg/dL — ABNORMAL HIGH (ref 70–99)
Glucose-Capillary: 135 mg/dL — ABNORMAL HIGH (ref 70–99)

## 2020-11-30 MED ORDER — HYDRALAZINE HCL 25 MG PO TABS
25.0000 mg | ORAL_TABLET | Freq: Four times a day (QID) | ORAL | Status: DC
  Administered 2020-11-30 – 2020-12-01 (×2): 25 mg
  Filled 2020-11-30 (×2): qty 1

## 2020-11-30 MED ORDER — APIXABAN 5 MG PO TABS
5.0000 mg | ORAL_TABLET | Freq: Two times a day (BID) | ORAL | Status: DC
  Administered 2020-11-30 – 2020-12-14 (×28): 5 mg
  Filled 2020-11-30 (×28): qty 1

## 2020-11-30 MED ORDER — CARVEDILOL 6.25 MG PO TABS: 6.2500 mg | ORAL_TABLET | Freq: Two times a day (BID) | ORAL | Status: DC

## 2020-11-30 MED ORDER — ENSURE ENLIVE PO LIQD
237.0000 mL | Freq: Two times a day (BID) | ORAL | Status: DC
  Administered 2020-12-01 – 2020-12-14 (×21): 237 mL via ORAL

## 2020-11-30 MED ORDER — JEVITY 1.5 CAL/FIBER PO LIQD
660.0000 mL | ORAL | Status: DC
  Administered 2020-11-30 – 2020-12-13 (×14): 660 mL
  Filled 2020-11-30 (×2): qty 1000
  Filled 2020-11-30: qty 711
  Filled 2020-11-30 (×13): qty 1000

## 2020-11-30 NOTE — Progress Notes (Addendum)
1:20pm: CSW spoke with Diane at [facility] who is requesting clinicals be sent for MD review to determine if patient is eligible for admission to that facility as a rehab patient. CSW faxed clinicals to [phone], attention: Hassell Done.  12:15pm: CSW spoke with patient's brother Marland Kitchen to inform him of information obtained from the [facility] representative. CSW provided [facility] with contact information to complete the financial review to determine if the patient is [facility] for [facility].  Madilyn Fireman, MSW, LCSW Transitions of Care  Clinical Social Worker II [phone]

## 2020-11-30 NOTE — Progress Notes (Addendum)
[facility] PROGRESS NOTE  Clifford [name] NOM:[id number] DOB: 12-Sep-1950 DOA: 10/14/2020 PCP: Pcp, No  Status:  Remains inpatient appropriate because:Unsafe d/c plan  Dispo: The patient is from: Home              Anticipated d/c is to: SNF              Patient currently is medically stable to d/c.   Difficult to place patient Yes              Barriers to discharge: Needs Medicaid number which is  pending-[facility]-unclear if they were made aware of current admission- does have Medicare Part A   Level of care: Med-Surg   Code Status: DNR Family Communication:  DVT prophylaxis: Eliquis COVID vaccination status: Unknown  HPI: 70 y.o. 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  He was managed with supportive care-hospital course was complicated by worsening somnolence-further imaging studies showed cytotoxic edema with some mass-effect on the right lateral ventricle.  Neurology continued to follow closely-he was managed with continued supportive care.  He continued to exhibit significant left-sided hemiplegia/dysphagia-ultimately required G-tube placement.  Plans are for SNF on discharge.  Pradaxa prescribed prior to this hospitalization but was held due to petechial hemorrhage/severe dysphagia.  Subsequently been transitioned to eliquis.  Lung nodule 8 mm left upper lobe which is stable and can be followed up by PCP after discharge   Subjective: Awakened.  No complaints or physical issues when asked.  Patient smiled when I told him he did well with therapy yesterday.  Objective: Vitals:   11/30/20 0009 11/30/20 0350  BP: 126/72 (!) 142/86  Pulse:  91  Resp: 20 18  Temp: 98.7 F (37.1 C)  98.5 F (36.9 C)  SpO2:  100%    Intake/Output Summary (Last 24 hours) at 11/30/2020 0803 Last data filed at 11/29/2020 1912 Gross per 24 hour  Intake 250 ml  Output 300 ml  Net -50 ml   Filed Weights   11/28/20 0453 11/29/20 0500 11/30/20 0402  Weight: 84.6 kg 87.9 kg 85.8 kg    Exam:  Constitutional: NAD, calm, comfortable Respiratory: Lungs remain clear on anterior auscultation, room air. Cardiovascular: Heart sounds S1-S2, normotensive, regular pulse Abdomen: Soft, nontender and non-distended, bowel sounds normoactive, LBM 9/25-PEG tube site unremarkable Neurologic: CN 2-12 intact except for documented left visual field deficit with right gaze preference as well as left facial droop.  Continued left side neglect with associated hemiplegia.  RUE in splint.  Right foot with foot drop. Psychiatric: Awake and oriented to name and place but not to year.   Stroke work-up: 8/3>> CT angio head: No intracranial large vessel occlusion 8/3>> CT angio neck: Minimal atherosclerotic change in the bulb internal carotid artery bulbs.  No stenosis. 8/4>> A1c: 5.9 8/4>> LDL: 75 8/4>> Echo: EF 96-28%, grade 1 diastolic dysfunction. 8/11>> CT head: Expected evolution of Rt MCA/PCA territory infarct w petechial hemorrhage/mild edema. 8/11>> MRI brain: Right MCA infarct-increased cytotoxic edema/petechial hemorrhage without midline shift. 8/16>> CT head: Evolving right posterior MCA territory infarct with increased cytotoxic edema.  Moderate mass-effect on the right lateral ventricle. 8/25>> MRI brain: Progressive swelling in the right hemisphere infarction-minimal petechial blood products.  Small acute infarctions of the inferior basal ganglia on  the right.  8/12-10/2011>>LTM EEG: No seizures. 8/12:>>Spot EEG: No seizures    Assessment/Plan: Acute problems: Right MCA/PCA territory infarct with petechial hemorrhage/evolution-with mild edema/slight midline shift on repeat CT head on 8/16:   Stable with dense left hemiplegia and left-sided neglect-unable to ambulate at this time Noted with left foot drop continue splint Continue Skelaxin for spasticity   Possible focal seizure:  Continue Keppra/Topamax.   Headache:  Stable on Topamax.  Probably related to CVA/recent cytotoxic edema.   Fever on 9/10:  Resolved-cultures negative.   Completed 5-day course of cefepime.   HTN:  continue amlodipine/hydralazine/metoprolol.   HLD:  Continue Lipitor   AKI on CKD stage IIIa:  AKI likely hemodynamically mediated-resolved-creatinine back to baseline of 6.0-7.3   Rate 1 diastolic dysfunction HFpEF:  Euvolemic on exam. 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   Dysphagia Nutrition Problem: Inadequate oral intake Etiology: dysphagia Signs/Symptoms: SLP recommending D1 diet Interventions: Tube feeding, Prostat   Obesity: Estimated body mass index is 30.12 kg/m as calculated from the following:   Height as of this encounter: 5\' 5"  (1.651 m).   Weight as of this encounter: 82.1 kg.     Data Reviewed: Basic Metabolic Panel: Recent Labs  Lab 11/24/20 0357 11/29/20 0009  NA 136 134*  K 3.8 4.0  CL 106 105  CO2 21* 21*  GLUCOSE 138* 117*  BUN 32* 26*  CREATININE 1.40* 1.33*  CALCIUM 8.7* 8.5*  MG  --  1.8   Liver Function Tests: Recent Labs  Lab 11/24/20 0357 11/29/20 0009  AST 35 32  ALT 45* 36  ALKPHOS 77 74  BILITOT 0.4 0.5  PROT 6.4* 6.2*  ALBUMIN 2.6* 2.6*   CBC: Recent Labs  Lab 11/24/20 0357 11/29/20 0009  WBC 6.7 8.0  HGB 11.1* 10.5*  HCT 34.5* 32.7*  MCV 81.2 80.7  PLT 138* 159    CBG: Recent Labs  Lab 11/29/20 1124 11/29/20 1710 11/29/20 1939 11/29/20 2346 11/30/20 0349  GLUCAP 128* 116* 135* 114* 147*      Scheduled Meds:  amLODipine  10 mg Per  Tube Daily   apixaban  5 mg Per Tube BID   atorvastatin  40 mg Per Tube Daily   feeding supplement (PROSource TF)  45 mL Per Tube BID   free water  200 mL Per Tube Q4H   hydrALAZINE  25 mg Per Tube QID   insulin aspart  0-9 Units Subcutaneous Q4H   levETIRAcetam  250 mg Per Tube BID   mouth rinse  15 mL Mouth Rinse BID   metaxalone  400 mg Per Tube TID   metoprolol tartrate  12.5 mg Per Tube BID   modafinil  100 mg Per Tube Daily   nystatin  5 mL Mouth/Throat QID   pantoprazole sodium  40 mg Per Tube Daily   saccharomyces boulardii  250 mg Per Tube BID   topiramate  50 mg Per Tube BID   Continuous Infusions:  feeding supplement (JEVITY 1.5 CAL/FIBER) 1,000 mL (11/30/20 0059)    Principal Problem:   Left-sided weakness Active Problems:   Stroke (HCC)   Essential hypertension   Mixed hyperlipidemia   Prediabetes   History of pulmonary embolism   Chronic anticoagulation   Nicotine dependence, cigarettes, uncomplicated   Petechial hemorrhage   Acute stroke due to ischemia (  [facility])   Advanced care planning/counseling discussion   Goals of care, counseling/discussion   Palliative care by specialist   Consultants: Neurology Interventional radiology  Procedures: Cortrack tube EEG PEG tube  Antibiotics: Cefepime 9/10 through 9/14 Vancomycin dose on 9/10   Time spent: 35 minutes    Erin Hearing ANP  [facility] 7 am - 330 pm/M-F for direct patient care and secure chat Please refer to Amion for contact info 47  days

## 2020-11-30 NOTE — Plan of Care (Signed)
  Problem: Clinical Measurements: Goal: Respiratory complications will improve Outcome: Progressing   Problem: Nutrition: Goal: Adequate nutrition will be maintained Outcome: Progressing   Problem: Safety: Goal: Ability to remain free from injury will improve Outcome: Progressing   

## 2020-11-30 NOTE — Progress Notes (Signed)
Nutrition Follow-up  DOCUMENTATION CODES:  Not applicable  INTERVENTION:  Transition to bolus TF via PEG: -Jevity 1.5 @ 54ml/hr (661ml/d) provided over 12 hours from 1800-0600 (6:00pm-6:00am) -98ml Prosource TF BID -Free water per MD, currently 287ml free water Q4H   Provides 1070 kcals, 64 grams protein, 501 ml free water (1701 ml total free water with flushes) Meets ~54% minimum estimated calorie needs and 64% minimum estimated protein needs  -Ensure Enlive po BID, each supplement provides 350 kcal and 20 grams of protein  NUTRITION DIAGNOSIS:  Inadequate oral intake related to dysphagia as evidenced by NPO status. -- ongoing  GOAL:  Patient will meet greater than or equal to 90% of their needs -- addressing with TF and supplements  MONITOR:  TF tolerance, Weight trends, Labs, I & O's  REASON FOR ASSESSMENT:  Consult Enteral/tube feeding initiation and management  ASSESSMENT:  Pt with a PMH significant for CHF, HTN, HLD, PE, recent R MCA/PCA watershed ischemic stroke admitted with evolving/increasing size of stroke, petechial hemorrhages, cytotoxic edema, concern for new seizures.  8/30 PEG placed  Pt continues to tolerate TF via PEG per RN. Currently receiving Jevity 1.5 @ 38ml/hr, 48ml Prosource TF BID, 212ml free water Q4H. Pt was advanced to Dysphagia 1 diet with thin liquids on 9/23, meal completion since then documented as 0-50% x 7 meals (21% average meal intake). Last 2 meal completions both 50% so suspect intake will continue to improve. Will adjust TF to be nocturnal to promote PO intake and will order oral nutrition supplements.   Medications reviewed and include Novolog, Keppra, Protonix, Florastor. Labs reviewed: Na 134 (L), BUN 26 (H), Cr 1.33 (H) CBGs: 147-123-119-134  Admission weight 95.7 kg Current weight 85.8 kg  Diet Order:   Diet Order             DIET - DYS 1 Room service appropriate? Yes with Assist; Fluid consistency: Thin  Diet effective now                   EDUCATION NEEDS:  No education needs have been identified at this time  Skin:  Skin Assessment: Skin Integrity Issues: Skin Integrity Issues:: Other (Comment) Other: non-pressure wound base of penis  Last BM:  9/25  Height:  Ht Readings from Last 1 Encounters:  10/14/20 5\' 5"  (1.651 m)   Weight:  Wt Readings from Last 1 Encounters:  11/30/20 85.8 kg   Ideal Body Weight:  61.82 kg  BMI:  Body mass index is 31.48 kg/m.  Estimated Nutritional Needs:  Kcal:  2000-2200 Protein:  100-120 grams Fluid:  >2L   Larkin Ina, MS, RD, LDN (she/her/hers) RD pager number and weekend/on-call pager number located in [place].

## 2020-12-01 ENCOUNTER — Inpatient Hospital Stay (HOSPITAL_COMMUNITY): Payer: No Typology Code available for payment source

## 2020-12-01 DIAGNOSIS — R531 Weakness: Secondary | ICD-10-CM | POA: Diagnosis not present

## 2020-12-01 LAB — CBC
HCT: 35.5 % — ABNORMAL LOW (ref 39.0–52.0)
Hemoglobin: 11.1 g/dL — ABNORMAL LOW (ref 13.0–17.0)
MCH: 25.8 pg — ABNORMAL LOW (ref 26.0–34.0)
MCHC: 31.3 g/dL (ref 30.0–36.0)
MCV: 82.4 fL (ref 80.0–100.0)
Platelets: 190 10*3/uL (ref 150–400)
RBC: 4.31 MIL/uL (ref 4.22–5.81)
RDW: 15.8 % — ABNORMAL HIGH (ref 11.5–15.5)
WBC: 9.6 10*3/uL (ref 4.0–10.5)
nRBC: 0 % (ref 0.0–0.2)

## 2020-12-01 LAB — GLUCOSE, CAPILLARY
Glucose-Capillary: 106 mg/dL — ABNORMAL HIGH (ref 70–99)
Glucose-Capillary: 119 mg/dL — ABNORMAL HIGH (ref 70–99)
Glucose-Capillary: 127 mg/dL — ABNORMAL HIGH (ref 70–99)
Glucose-Capillary: 130 mg/dL — ABNORMAL HIGH (ref 70–99)
Glucose-Capillary: 131 mg/dL — ABNORMAL HIGH (ref 70–99)
Glucose-Capillary: 131 mg/dL — ABNORMAL HIGH (ref 70–99)
Glucose-Capillary: 167 mg/dL — ABNORMAL HIGH (ref 70–99)

## 2020-12-01 LAB — C-REACTIVE PROTEIN: CRP: 4.9 mg/dL — ABNORMAL HIGH (ref <1.0)

## 2020-12-01 LAB — BRAIN NATRIURETIC PEPTIDE: B Natriuretic Peptide: 86.7 pg/mL (ref 0.0–100.0)

## 2020-12-01 LAB — MAGNESIUM: Magnesium: 1.9 mg/dL (ref 1.7–2.4)

## 2020-12-01 LAB — PROCALCITONIN: Procalcitonin: 0.12 ng/mL

## 2020-12-01 MED ORDER — HYDRALAZINE HCL 20 MG/ML IJ SOLN: 10.0000 mg | Freq: Four times a day (QID) | INTRAMUSCULAR | Status: DC | PRN

## 2020-12-01 MED ORDER — CARVEDILOL 12.5 MG PO TABS
12.5000 mg | ORAL_TABLET | Freq: Two times a day (BID) | ORAL | Status: DC
  Administered 2020-12-01 – 2020-12-14 (×25): 12.5 mg
  Filled 2020-12-01 (×28): qty 1

## 2020-12-01 MED ORDER — LACTATED RINGERS IV BOLUS
250.0000 mL | Freq: Once | INTRAVENOUS | Status: AC
  Administered 2020-12-01: 250 mL via INTRAVENOUS

## 2020-12-01 NOTE — Progress Notes (Signed)
Speech Language Pathology Treatment: Dysphagia;Cognitive-Linquistic  Patient Details Name: Clifford [name] MRN: [id number] DOB: 1950/05/06 Today's Date: 12/01/2020 Time: 9794-8016 SLP Time Calculation (min) (ACUTE ONLY): 20 min  Assessment / Plan / Recommendation Clinical Impression  Pt alert and repositioned upright in bed, eager to consume POs. He continues to require min-mod verbal cueing to slow rate of intake due to impulsivity. No overt s/sx of aspiration observed with thin liquids via straw sips and x1 overt cough exhibited post bite of puree across multiple trials. Min residuals of puree mixed with secretions noted in L buccal cavity. With verbal cues for lingual sweep/repeat swallow, orally cavity cleared completely. He continues to require cueing to attend to L labial spillage of liquids as well. Simulated dysphagia 2 solid attempted, however this increased residuals in L buccal cavity, which pt had difficulty clearing. During structured speech task, pt utilized over articulation and increase of vocal intensity to produce single words with 90% intelligibility given consistent, mod verbal cueing. Without cueing, pt exhibits low vocal intensity and 50% intelligibility at the word-phrase level. SLP to continue f/u for dysphagia, motor speech and cognitive functions.    HPI HPI: 70yo male admitted 10/14/20 with right side headache and LUE weakness. 10/19/20 pt was noted to have increased LUE weakness. 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  Recommendations may be updated based on patient status, additional functional criteria and insurance authorization.     Recommendations  Diet recommendations: Dysphagia 1 (puree);Thin liquid Liquids provided via: Straw Medication Administration: Via alternative means Supervision: Full supervision/cueing for compensatory strategies;Staff to assist with self feeding Compensations: Minimize environmental distractions;Slow rate;Small sips/bites;Monitor for anterior loss;Lingual sweep for clearance of pocketing Postural Changes and/or Swallow Maneuvers: Seated upright 90 degrees;Upright 30-60 min after meal                Oral Care Recommendations: Oral care BID Follow up Recommendations: Skilled Nursing facility SLP Visit Diagnosis: Dysphagia, oropharyngeal phase (R13.12);Dysarthria and anarthria (R47.1) Plan: Continue with current plan of care       [place], [place], [place] Office Number: [phone]  Clifford [name]  12/01/2020, 12:58 PM

## 2020-12-01 NOTE — Progress Notes (Signed)
Occupational Therapy Treatment Patient Details Name: Clifford [name] MRN: [id number] DOB: 03-30-1950 Today's Date: 12/01/2020   History of present illness 70 yo male presented to ED on 8/11 for R sided headache and worsening L UE weakness. Patient recently admitted 8/3-8/5 for R CVA with L visual deficits and L sided weakness. MRI with mild enlargement of R MCA/border zone infarct since 10/07/20 MRI and increased cytotoxic edema and petechial hemorrhage without midline shift. Palliative Care consulted and in detailed family discussions, with prognosis provided by Neurology (more likely able to regain ability to talk and participate in self cares, unlikely to regain ability to walk, uncertain if able to regain ability to swallow, very unlikely to improve to full independence), it was decided to place PEG and pursue SNF rehab, PEG was placed 8/30. 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   OT comments  [place] is making incremental progress. Upon arrival pt noted with ill placement of splints and increased edema of L hand. Session focused at bed level for exercise, L sided awareness, bathing, re-positioning and splint placement. Pt tolerated all exercises will, with intermittent complaints of pain at end range PROM. He required maximal cues for awareness and sequencing to use RUE to bathe his LUE, and would track the wash cloth with his eyes/head to midline only. Splints re-applied and LUE was elevated. RN made aware of some skin integrity problems at the L hand, and good positioning of LUE. He continues to benefit from OT acutely. D/c recommend ation remains appropriate.    Recommendations for follow up therapy are one component of a multi-disciplinary discharge planning process, led by the attending physician.  Recommendations may be updated based on patient status, additional functional criteria and insurance authorization.    Follow Up Recommendations  SNF    Equipment Recommendations  None  recommended by OT       Precautions / Restrictions Precautions Precautions: Fall Precaution Comments: L visual field deficit, R gaze preference, L inattention, L hemparesis; G-tube and abdominal binder; penile skin breakdown from long term condom cath use/moisture Required Braces or Orthoses: Other Brace Splint/Cast: soft elbow splint, prefab resting hand splint Other Brace: prevalon boots, L elbow splint and resting hand splint Restrictions Weight Bearing Restrictions: No       Mobility Bed Mobility Overal bed mobility: Needs Assistance   Rolling: Max assist         General bed mobility comments: Rolled to reposition                ADL either performed or assessed with clinical judgement   ADL Overall ADL's : Needs assistance/impaired         Upper Body Bathing: Moderate assistance;Bed level Upper Body Bathing Details (indicate cue type and reason): Requried multimodial cues to initiate bathing LUE with R hand, as well as cues to move cloth throughout the arm - otherwise pt will repetively wash one area.       General ADL Comments: Session spent at bed level, focused on attention to L side, LUE bathing, exercises, and proper positoning of splint     Vision   Vision Assessment?: Yes Alignment/Gaze Preference: Head turned;Gaze right Visual Fields: Left homonymous hemianopsia          Cognition Arousal/Alertness: Awake/alert Behavior During Therapy: Flat affect Overall Cognitive Status: No family/caregiver present to determine baseline cognitive functioning             General Comments: Pt more altert this session, following commands  about 50% of the time. continues to be oriented to self only. pt mentioning "worms" in his ear, and his "head seperating from his body" and referring to his LUE as his "dead one"              General Comments pts L hand had incrased edema and poor splint positions and some moisture related skin integrity issues noted  between his fingers. RN made aware of all    Pertinent Vitals/ Pain       Pain Assessment: Faces Faces Pain Scale: Hurts a little bit Pain Location: LUE with PROM Pain Descriptors / Indicators: Grimacing;Discomfort;Moaning Pain Intervention(s): Limited activity within patient's tolerance;Monitored during session;Repositioned   Frequency  Min 2X/week        Progress Toward Goals  OT Goals(current goals can now be found in the care plan section)  Progress towards OT goals: Progressing toward goals  Acute Rehab OT Goals Patient Stated Goal: None stated OT Goal Formulation: Patient unable to participate in goal setting Time For Goal Achievement: 12/09/20 Potential to Achieve Goals: Fair ADL Goals Pt Will Perform Grooming: with mod assist;sitting;bed level Pt Will Perform Lower Body Bathing: with mod assist;bed level Pt Will Perform Lower Body Dressing: with mod assist;sitting/lateral leans Pt Will Transfer to Toilet: with mod assist;squat pivot transfer Pt Will Perform Toileting - Clothing Manipulation and hygiene: with mod assist;bed level Additional ADL Goal #1: Pt will locate three grooming items in the left visual field wtih mod cues Additional ADL Goal #2: Pt will demonstrate emergent awareness during ADLs with Min cues  Plan Discharge plan remains appropriate       AM-PAC OT "6 Clicks" Daily Activity     Outcome Measure   Help from another person eating meals?: Total Help from another person taking care of personal grooming?: A Lot Help from another person toileting, which includes using toliet, bedpan, or urinal?: Total Help from another person bathing (including washing, rinsing, drying)?: Total Help from another person to put on and taking off regular upper body clothing?: A Lot Help from another person to put on and taking off regular lower body clothing?: Total 6 Click Score: 8    End of Session Equipment Utilized During Treatment: Other (comment)  (splints)  OT Visit Diagnosis: Unsteadiness on feet (R26.81);Muscle weakness (generalized) (M62.81);Other symptoms and signs involving cognitive function   Activity Tolerance Patient tolerated treatment well   Patient Left in bed;with call [place]/phone within reach;with bed alarm set   Nurse Communication Mobility status (skin integrity)        Time: 8466-5993 OT Time Calculation (min): 25 min  Charges: OT General Charges $OT Visit: 1 Visit OT Treatments $Self Care/Home Management : 8-22 mins $Therapeutic Exercise: 23-37 mins    Evon Dejarnett A Daleigh Pollinger 12/01/2020, 4:23 PM

## 2020-12-01 NOTE — Progress Notes (Signed)
CSW received call from [place] at [facility] who states the facility will not be able to accept the patient at this time.  Madilyn Fireman, MSW, LCSW Transitions of Care  Clinical Social Worker II [phone]

## 2020-12-01 NOTE — Plan of Care (Signed)
  Problem: Education: Goal: Knowledge of General Education information will improve Description: Including pain rating scale, medication(s)/side effects and non-pharmacologic comfort measures Outcome: Progressing   Problem: Clinical Measurements: Goal: Will remain free from infection Outcome: Progressing Goal: Respiratory complications will improve Outcome: Progressing Goal: Cardiovascular complication will be avoided Outcome: Progressing   Problem: Activity: Goal: Risk for activity intolerance will decrease Outcome: Progressing   

## 2020-12-01 NOTE — Progress Notes (Signed)
[facility] PROGRESS NOTE  Clifford [name] PXT:[id number] DOB: 12/15/1950 DOA: 10/14/2020 PCP: Pcp, No  Status: Remains inpatient appropriate because:Unsafe d/c plan  Dispo: The patient is from: Home              Anticipated d/c is to: SNF              Patient currently is medically stable to d/c.   Difficult to place patient Yes              Barriers to discharge: Needs Medicaid number which is  pending-[facility] from [facility]-unclear if they were made aware of current admission- does have Medicare Part A   Level of care: Med-Surg   Code Status: DNR Family Communication:  DVT prophylaxis: Eliquis COVID vaccination status: Unknown  HPI: 70 y.o. 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  He was managed with supportive care-hospital course was complicated by worsening somnolence-further imaging studies showed cytotoxic edema with some mass-effect on the right lateral ventricle.  Neurology continued to follow closely-he was managed with continued supportive care.  He continued to exhibit significant left-sided hemiplegia/dysphagia-ultimately required G-tube placement.  Plans are for SNF on discharge.  Pradaxa prescribed prior to this hospitalization but was held due to petechial hemorrhage/severe dysphagia.  Subsequently been transitioned to eliquis.  Lung nodule 8 mm left upper lobe which is stable and can be followed up by PCP after discharge.  On 9/27 received information from his [place] [facility] that he has had acute hepatitis from Eliquis in the past.  Discussed with pharmacist and attending MD and since current LFTs are within normal limits plan is to continue Eliquis and follow LFTs weekly.    Subjective: Awake.  Frowning because lab is in the room to obtain some blood.  Objective: Vitals:   11/30/20 2304 12/01/20 0305  BP: (!) 148/78   Pulse: 93   Resp: 20 20  Temp: 98.5 F (36.9 C) 98.3 F (36.8 C)  SpO2: 99%     Intake/Output Summary (Last 24 hours) at 12/01/2020 0747 Last data filed at 12/01/2020 0600 Gross per 24 hour  Intake 780 ml  Output 1950 ml  Net -1170 ml   Filed Weights   11/29/20 0500 11/30/20 0402 12/01/20 0417  Weight: 87.9 kg 85.8 kg 85.6 kg    Exam:  Constitutional: NAD, calm, comfortable Respiratory: Lung sounds are clear to auscultation anteriorly, no increased work of breathing, stable on room air Cardiovascular: S1-S2, trend and increasing blood pressure today regular pulse Abdomen: Soft, nontender and non-distended, bowel sounds normoactive, LBM 9/27-PEG tube site unremarkable Neurologic: CN 2-12 intact except for documented left visual field deficit with right gaze preference as well as left facial droop.  Continued left side neglect with associated hemiplegia.  RUE in splint.  Right foot with foot drop. Psychiatric: Awake and oriented x 3   Stroke work-up: 8/3>> CT angio head: No intracranial large vessel occlusion 8/3>> CT angio neck: Minimal atherosclerotic change in the bulb internal carotid artery bulbs.  No stenosis. 8/4>> A1c: 5.9 8/4>> LDL: 75 8/4>> Echo: EF 62-70%, grade 1 diastolic dysfunction. 8/11>> CT head: Expected evolution of Rt MCA/PCA territory infarct w petechial hemorrhage/mild edema. 8/11>> MRI brain: Right MCA infarct-increased cytotoxic edema/petechial hemorrhage without midline shift. 8/16>> CT head: Evolving  right posterior MCA territory infarct with increased cytotoxic edema.  Moderate mass-effect on the right lateral ventricle. 8/25>> MRI brain: Progressive swelling in the right hemisphere infarction-minimal petechial blood products.  Small acute infarctions of the inferior basal ganglia on the right.   8/12-10/2011>>LTM EEG: No seizures. 8/12:>>Spot EEG: No seizures    Assessment/Plan: Acute problems: Right MCA/PCA territory infarct with petechial hemorrhage/evolution-with mild edema/slight midline shift on repeat CT head on 8/16:  Stable with dense left hemiplegia and left-sided neglect-unable to ambulate at this time Noted with left foot drop continue splint Continue Skelaxin for spasticity   Possible focal seizure:  Continue Keppra/Topamax.   Headache:  Stable on Topamax.  Probably related to CVA/recent cytotoxic edema.   Fever on 9/10:  Resolved-cultures negative.   Completed 5-day course of cefepime.   HTN:  continue amlodipine/hydralazine prn Carvedilol added on 9/28   HLD:  Continue Lipitor   AKI on CKD stage IIIa:  AKI likely hemodynamically mediated-resolved-creatinine back to baseline of 3.9-7.6   Rate 1 diastolic dysfunction HFpEF:  Euvolemic on exam. 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   Dysphagia Nutrition Problem: Inadequate oral intake Etiology: dysphagia Signs/Symptoms: SLP recommending D1 diet Interventions: Tube feeding, Prostat   Obesity: Estimated body mass index is 30.12 kg/m as calculated from the following:   Height as of this encounter: 5\' 5"  (1.651 m).   Weight as of this encounter: 82.1 kg.     Data Reviewed: Basic Metabolic Panel: Recent Labs  Lab 11/29/20 0009 11/30/20 1621  NA 134* 134*  K 4.0 3.8  CL 105 105  CO2 21* 21*  GLUCOSE 117* 133*  BUN 26* 29*  CREATININE 1.33* 1.29*  CALCIUM 8.5* 8.8*  MG 1.8  --    Liver Function  Tests: Recent Labs  Lab 11/29/20 0009 11/30/20 1621  AST 32 33  ALT 36 33  ALKPHOS 74 83  BILITOT 0.5 0.6  PROT 6.2* 6.4*  ALBUMIN 2.6* 2.7*   CBC: Recent Labs  Lab 11/29/20 0009  WBC 8.0  HGB 10.5*  HCT 32.7*  MCV 80.7  PLT 159    CBG: Recent Labs  Lab 11/30/20 0349 11/30/20 0905 11/30/20 1300 11/30/20 1615 11/30/20 2011  GLUCAP 147* 123* 119* 134* 135*      Scheduled Meds:  amLODipine  10 mg Per Tube Daily   apixaban  5 mg Per Tube BID   atorvastatin  40 mg Per Tube Daily   carvedilol  12.5 mg Per Tube BID WC   feeding supplement  237 mL Oral BID BM   feeding supplement (JEVITY 1.5 CAL/FIBER)  660 mL Per Tube Q24H   feeding supplement (PROSource TF)  45 mL Per Tube BID   free water  200 mL Per Tube Q4H   insulin aspart  0-9 Units Subcutaneous Q4H   levETIRAcetam  250 mg Per Tube BID   mouth rinse  15 mL Mouth Rinse BID   metaxalone  400 mg Per Tube TID   modafinil  100 mg Per Tube Daily   nystatin  5 mL Mouth/Throat QID   pantoprazole sodium  40 mg Per Tube Daily  saccharomyces boulardii  250 mg Per Tube BID   topiramate  50 mg Per Tube BID   Continuous Infusions:    Principal Problem:   Left-sided weakness Active Problems:   Stroke ([place])   Essential hypertension   Mixed hyperlipidemia   Prediabetes   History of pulmonary embolism   Chronic anticoagulation   Nicotine dependence, cigarettes, uncomplicated   Petechial hemorrhage   Acute stroke due to ischemia [facility])   Advanced care planning/counseling discussion   Goals of care, counseling/discussion   Palliative care by specialist   Consultants: Neurology Interventional radiology  Procedures: Cortrack tube EEG PEG tube  Antibiotics: Cefepime 9/10 through 9/14 Vancomycin dose on 9/10   Time spent: 25 minutes    Erin Hearing ANP  [facility] 7 am - 330 pm/M-F for direct patient care and secure chat Please refer to Amion for contact info 48  days

## 2020-12-02 ENCOUNTER — Inpatient Hospital Stay (HOSPITAL_COMMUNITY): Payer: No Typology Code available for payment source

## 2020-12-02 LAB — GLUCOSE, CAPILLARY
Glucose-Capillary: 136 mg/dL — ABNORMAL HIGH (ref 70–99)
Glucose-Capillary: 119 mg/dL — ABNORMAL HIGH (ref 70–99)
Glucose-Capillary: 137 mg/dL — ABNORMAL HIGH (ref 70–99)
Glucose-Capillary: 133 mg/dL — ABNORMAL HIGH (ref 70–99)

## 2020-12-02 NOTE — Progress Notes (Addendum)
1:45pm: CSW spoke with [place] at [facility] who states the [place] is willing to review the patient's clinical information.   CSW faxed clinicals to [phone].  10:15am: CSW attempted to reach Psychologist, forensic at the [facility] without success - a voicemail was left requesting a return call.  Madilyn Fireman, MSW, LCSW Transitions of Care  Clinical Social Worker II [phone]

## 2020-12-02 NOTE — Plan of Care (Signed)
  Problem: Activity: Goal: Risk for activity intolerance will decrease Outcome: Progressing   Problem: Nutrition: Goal: Adequate nutrition will be maintained Outcome: Progressing   Problem: Coping: Goal: Level of anxiety will decrease Outcome: Progressing   Problem: Safety: Goal: Ability to remain free from injury will improve Outcome: Progressing   

## 2020-12-02 NOTE — Progress Notes (Signed)
Physical Therapy Treatment Patient Details Name: Clifford [name] MRN: [id number] DOB: 1950/05/19 Today's Date: 12/02/2020   History of Present Illness 70 yo male presented to ED on 8/11 for R sided headache and worsening L UE weakness. Patient recently admitted 8/3-8/5 for R CVA with L visual deficits and L sided weakness. MRI with mild enlargement of R MCA/border zone infarct since 10/07/20 MRI and increased cytotoxic edema and petechial hemorrhage without midline shift. Palliative Care consulted and in detailed family discussions, with prognosis provided by Neurology (more likely able to regain ability to talk and participate in self cares, unlikely to regain ability to walk, uncertain if able to regain ability to swallow, very unlikely to improve to full independence), it was decided to place PEG and pursue SNF rehab, PEG was placed 8/30. 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    PT Comments    Pt received in supine, sleeping but able to be awoken to voice and agreeable to therapy session with participation as able. Emphasis on repositioning for pressure relief, increased attention to L side and midline, self-assist with LUE repositioning, importance of frequent skin checks (brother present and instructed on alerting staff if cleanup suspected/needed due to risk of moisture damage to skin), and positioning for stroke patients. Pt able to perform RUE/RLE A/AAROM supine and sidelying exercises but hypertonia in LLE and no AROM observed LUE (hypotonia)/LLE. Pt continues to benefit from PT services to progress toward functional mobility goals.   Recommendations for follow up therapy are one component of a multi-disciplinary discharge planning process, led by the attending physician.  Recommendations may be updated based on patient status, additional functional criteria and insurance authorization.  Follow Up Recommendations  SNF;Supervision/Assistance - 24 hour     Equipment Recommendations   Hospital bed;3in1 (PT);Wheelchair cushion (measurements PT);Wheelchair (measurements PT);Other (comment) (mechanical lift and pads)    Recommendations for Other Services       Precautions / Restrictions Precautions Precautions: Fall Precaution Comments: L visual field deficit, R gaze preference, L inattention, L hemparesis; G-tube and abdominal binder; peri skin breakdown Required Braces or Orthoses: Other Brace Splint/Cast: soft elbow splint, prefab resting hand splint Other Brace: prevalon boots, L elbow splint and resting hand splint Restrictions Weight Bearing Restrictions: No     Mobility  Bed Mobility Overal bed mobility: Needs Assistance Bed Mobility: Rolling Rolling: Max assist;+2 for physical assistance;Total assist         General bed mobility comments: Rolled to reposition, pt able to assist slightly with RUE to roll L but totalA to roll to R due to LUE/LLE weakness and cognitive deficit    Transfers                    Ambulation/Gait                 Stairs             Wheelchair Mobility    Modified Rankin (Stroke Patients Only) Modified Rankin (Stroke Patients Only) Pre-Morbid Rankin Score: Moderately severe disability Modified Rankin: Severe disability     Balance Overall balance assessment: Needs assistance     Sitting balance - Comments: UTA this date, pt limited due to bowel/bladder incontinence and too lethargic after rolling for cleanup to attend to instructions for seated transfer  Cognition Arousal/Alertness: Awake/alert Behavior During Therapy: Flat affect Overall Cognitive Status: Impaired/Different from baseline Area of Impairment: Attention;Memory;Following commands;Safety/judgement;Awareness;Problem solving                 Orientation Level: Disoriented to;Time Current Attention Level: Sustained;Focused Memory: Decreased recall of precautions;Decreased  short-term memory Following Commands: Follows one step commands inconsistently;Follows one step commands with increased time;Follows multi-step commands inconsistently Safety/Judgement: Decreased awareness of safety;Decreased awareness of deficits Awareness: Intellectual Problem Solving: Slow processing;Decreased initiation;Requires verbal cues;Requires tactile cues;Difficulty sequencing General Comments: Pt drowsy, following commands about 25% of the time on R side but remains unable to gaze to midline (briefly once toward midline) or to L side despite max cues. Pt unable to tell when he is incontinent      Exercises Other Exercises Other Exercises: LUE PROM with scapular manipulation Other Exercises: L foot PROM in supine x10 reps Other Exercises: RUE pushing/pulling on bed rail while in sidelying x10 reps Other Exercises: RUE shoulder flexion and elbow flex/ext AAROM x 5 reps Other Exercises: R knee flex/ext while in sidelying and RLE heel slides AAROM x10 reps    General Comments General comments (skin integrity, edema, etc.): incontinent, bed linens changed with nsg staff also assisting while pt sidelying to L for cleanup      Pertinent Vitals/Pain Pain Assessment: Faces Faces Pain Scale: Hurts little more Pain Location: LUE with PROM and repositioning in bed; LLE pain also with hip flexion Pain Descriptors / Indicators: Grimacing;Discomfort;Moaning Pain Intervention(s): Limited activity within patient's tolerance;Monitored during session;Repositioned    Home Living                      Prior Function            PT Goals (current goals can now be found in the care plan section) Acute Rehab PT Goals Patient Stated Goal: None stated PT Goal Formulation: With patient Time For Goal Achievement: 12/06/20 Progress towards PT goals: Progressing toward goals (slow progress)    Frequency    Min 2X/week      PT Plan Current plan remains appropriate     Co-evaluation              AM-PAC PT "6 Clicks" Mobility   Outcome Measure  Help needed turning from your back to your side while in a flat bed without using bedrails?: A Lot Help needed moving from lying on your back to sitting on the side of a flat bed without using bedrails?: Total Help needed moving to and from a bed to a chair (including a wheelchair)?: Total Help needed standing up from a chair using your arms (e.g., wheelchair or bedside chair)?: Total Help needed to walk in hospital room?: Total Help needed climbing 3-5 steps with a railing? : Total 6 Click Score: 7    End of Session Equipment Utilized During Treatment: Other (comment) (bed pads) Activity Tolerance: Patient limited by lethargy Patient left: in bed;with call [place]/phone within reach;with bed alarm set;with SCD's reapplied;Other (comment) (bed in chair position and brother troy present in room; prevalon boots on) Nurse Communication: Mobility status PT Visit Diagnosis: Muscle weakness (generalized) (M62.81);Other abnormalities of gait and mobility (R26.89);Hemiplegia and hemiparesis Hemiplegia - Right/Left: Left Hemiplegia - caused by: Unspecified (enlargement of R MCA)     Time: 7616-0737 PT Time Calculation (min) (ACUTE ONLY): 28 min  Charges:  $Therapeutic Exercise: 8-22 mins $Therapeutic Activity: 8-22 mins  Houston Siren., PTA Acute Rehabilitation Services Pager: [phone] Office: [place] 12/02/2020, 5:04 PM

## 2020-12-02 NOTE — Progress Notes (Signed)
[facility] PROGRESS NOTE  Clifford [name] EXB:[id number] DOB: June 16, 1950 DOA: 10/14/2020 PCP: Pcp, No  Status: Remains inpatient appropriate because:Unsafe d/c plan  Dispo: The patient is from: Home              Anticipated d/c is to: SNF              Patient currently is medically stable to d/c.   Difficult to place patient Yes              Barriers to discharge: Needs Medicaid number which is  pending-[facility]-unclear if they were made aware of current admission- does have Medicare Part A   Level of care: Med-Surg   Code Status: DNR Family Communication:  DVT prophylaxis: Eliquis COVID vaccination status: Unknown  HPI: 70 y.o. 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  He was managed with supportive care-hospital course was complicated by worsening somnolence-further imaging studies showed cytotoxic edema with some mass-effect on the right lateral ventricle.  Neurology continued to follow closely-he was managed with continued supportive care.  He continued to exhibit significant left-sided hemiplegia/dysphagia-ultimately required G-tube placement.  Plans are for SNF on discharge.  Pradaxa prescribed prior to this hospitalization but was held due to petechial hemorrhage/severe dysphagia.  Subsequently been transitioned to eliquis.  Lung nodule 8 mm left upper lobe which is stable and can be followed up by PCP after discharge.  On 9/27 received information from his [facility] that he has had acute hepatitis from Eliquis in the past.  Discussed with pharmacist and attending MD and since current LFTs are within normal limits plan is to continue Eliquis and follow LFTs weekly.    Subjective: Awake.  When I asked him how he was doing he made the okay sign with his right hand.  Objective: Vitals:   12/02/20 0338 12/02/20 0737  BP: 132/78 132/74  Pulse:  71  Resp: 20 17  Temp: 98.5 F (36.9 C) 98.1 F (36.7 C)  SpO2: 100% 99%    Intake/Output Summary (Last 24 hours) at 12/02/2020 0756 Last data filed at 12/02/2020 1027 Gross per 24 hour  Intake --  Output 950 ml  Net -950 ml   Filed Weights   11/30/20 0402 12/01/20 0417 12/02/20 0500  Weight: 85.8 kg 85.6 kg 85 kg    Exam:  Constitutional: NAD, calm, comfortable Respiratory: Room air, anterior lung sounds clear, no increased work of breathing Cardiovascular: S1-S2, blood pressure much better controlled today, pulses regular and skin is warm and dry Abdomen: Soft, nontender and non-distended, bowel sounds normoactive, LBM 9/27-PEG tube site unremarkable Neurologic: CN 2-12 intact except for documented left visual field deficit with right gaze preference as well as left facial droop.  Continued left side neglect with associated hemiplegia.  RUE in splint.  Right foot with foot drop. Psychiatric: Awake and oriented x 3   Stroke work-up: 8/3>> CT angio head: No intracranial large vessel occlusion 8/3>> CT angio neck: Minimal atherosclerotic change in the bulb internal carotid artery bulbs.  No stenosis. 8/4>> A1c: 5.9 8/4>> LDL: 75 8/4>> Echo: EF 25-36%, grade 1 diastolic dysfunction. 8/11>> CT head: Expected evolution of Rt MCA/PCA territory infarct w petechial hemorrhage/mild edema. 8/11>> MRI brain: Right MCA infarct-increased cytotoxic edema/petechial hemorrhage without midline  shift. 8/16>> CT head: Evolving right posterior MCA territory infarct with increased cytotoxic edema.  Moderate mass-effect on the right lateral ventricle. 8/25>> MRI brain: Progressive swelling in the right hemisphere infarction-minimal petechial blood products.  Small acute infarctions of the inferior basal ganglia on the  right.  8/12-10/2011>>LTM EEG: No seizures. 8/12:>>Spot EEG: No seizures    Assessment/Plan: Acute problems: Right MCA/PCA territory infarct with petechial hemorrhage/evolution-with mild edema/slight midline shift on repeat CT head on 8/16:  Stable with dense left hemiplegia and left-sided neglect-unable to ambulate at this time Noted with left foot drop continue splint Continue Skelaxin for spasticity Therapies document slow/incremental progress   Possible focal seizure:  Continue Keppra/Topamax.   Headache:  Stable on Topamax.  Probably related to CVA/recent cytotoxic edema.   Fever on 9/10:  Resolved-cultures negative.   Completed 5-day course of cefepime.   HTN:  continue amlodipine/hydralazine prn Carvedilol added on 9/28 with improvement in blood pressure readings as of 9/29   HLD:  Continue Lipitor   AKI on CKD stage IIIa:  AKI likely hemodynamically mediated-resolved-creatinine back to baseline of 4.7-8.2   Rate 1 diastolic dysfunction HFpEF:  Euvolemic on exam. 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   Dysphagia Nutrition Problem: Inadequate oral intake Etiology: dysphagia Signs/Symptoms: SLP recommending D1 diet Interventions: Tube feeding, Prostat   Obesity: Estimated body mass index is 30.12 kg/m as calculated from the following:   Height as of this encounter: 5\' 5"  (1.651 m).   Weight as of this encounter: 82.1 kg.     Data Reviewed: Basic Metabolic Panel: Recent Labs  Lab 11/29/20 0009 11/30/20 1621 12/01/20 0900  NA 134* 134*  --   K 4.0 3.8  --   CL 105 105  --    CO2 21* 21*  --   GLUCOSE 117* 133*  --   BUN 26* 29*  --   CREATININE 1.33* 1.29*  --   CALCIUM 8.5* 8.8*  --   MG 1.8  --  1.9   Liver Function Tests: Recent Labs  Lab 11/29/20 0009 11/30/20 1621  AST 32 33  ALT 36 33  ALKPHOS 74 83  BILITOT 0.5 0.6  PROT 6.2* 6.4*  ALBUMIN 2.6* 2.7*   CBC: Recent Labs  Lab 11/29/20 0009 12/01/20 0900  WBC 8.0 9.6  HGB 10.5* 11.1*  HCT 32.7* 35.5*  MCV 80.7 82.4  PLT 159 190    CBG: Recent Labs  Lab 12/01/20 1155 12/01/20 1538 12/01/20 2042 12/01/20 2347 12/02/20 0606  GLUCAP 106* 119* 131* 127* 136*      Scheduled Meds:  apixaban  5 mg Per Tube BID   atorvastatin  40 mg Per Tube Daily   carvedilol  12.5 mg Per Tube BID WC   feeding supplement  237 mL Oral BID BM   feeding supplement (JEVITY 1.5 CAL/FIBER)  660 mL Per Tube Q24H   feeding supplement (PROSource TF)  45 mL Per Tube BID   free water  200 mL Per Tube Q4H   insulin aspart  0-9 Units Subcutaneous Q4H   levETIRAcetam  250 mg Per Tube BID   mouth rinse  15 mL Mouth Rinse BID   metaxalone  400 mg Per Tube TID   modafinil  100 mg Per Tube Daily   nystatin  5 mL Mouth/Throat QID   pantoprazole sodium  40 mg Per Tube Daily   saccharomyces boulardii  250 mg Per Tube BID   topiramate  50 mg Per Tube BID   Continuous Infusions:    Principal Problem:   Left-sided weakness Active Problems:   Stroke ([place])   Essential hypertension   Mixed hyperlipidemia   Prediabetes   History of pulmonary embolism   Chronic anticoagulation   Nicotine dependence, cigarettes, uncomplicated   Petechial hemorrhage   Acute stroke due to ischemia [facility])   Advanced care planning/counseling discussion   Goals of care, counseling/discussion   Palliative care by specialist   Consultants: Neurology Interventional radiology  Procedures: Cortrack tube EEG PEG tube  Antibiotics: Cefepime 9/10 through 9/14 Vancomycin dose on 9/10   Time spent: 25  minutes    Erin Hearing ANP  [facility] 7 am - 330 pm/M-F for direct patient care and secure chat Please refer to Amion for contact info 49  days

## 2020-12-03 LAB — GLUCOSE, CAPILLARY
Glucose-Capillary: 104 mg/dL — ABNORMAL HIGH (ref 70–99)
Glucose-Capillary: 110 mg/dL — ABNORMAL HIGH (ref 70–99)
Glucose-Capillary: 110 mg/dL — ABNORMAL HIGH (ref 70–99)
Glucose-Capillary: 126 mg/dL — ABNORMAL HIGH (ref 70–99)
Glucose-Capillary: 131 mg/dL — ABNORMAL HIGH (ref 70–99)
Glucose-Capillary: 134 mg/dL — ABNORMAL HIGH (ref 70–99)
Glucose-Capillary: 135 mg/dL — ABNORMAL HIGH (ref 70–99)

## 2020-12-03 LAB — CBC
HCT: 30.6 % — ABNORMAL LOW (ref 39.0–52.0)
Hemoglobin: 9.5 g/dL — ABNORMAL LOW (ref 13.0–17.0)
MCH: 25.6 pg — ABNORMAL LOW (ref 26.0–34.0)
MCHC: 31 g/dL (ref 30.0–36.0)
MCV: 82.5 fL (ref 80.0–100.0)
Platelets: 151 10*3/uL (ref 150–400)
RBC: 3.71 MIL/uL — ABNORMAL LOW (ref 4.22–5.81)
RDW: 15.9 % — ABNORMAL HIGH (ref 11.5–15.5)
WBC: 7.9 10*3/uL (ref 4.0–10.5)
nRBC: 0 % (ref 0.0–0.2)

## 2020-12-03 LAB — COMPREHENSIVE METABOLIC PANEL
ALT: 35 U/L (ref 0–44)
AST: 35 U/L (ref 15–41)
Albumin: 2.3 g/dL — ABNORMAL LOW (ref 3.5–5.0)
Alkaline Phosphatase: 67 U/L (ref 38–126)
Anion gap: 10 (ref 5–15)
BUN: 40 mg/dL — ABNORMAL HIGH (ref 8–23)
CO2: 20 mmol/L — ABNORMAL LOW (ref 22–32)
Calcium: 8.4 mg/dL — ABNORMAL LOW (ref 8.9–10.3)
Chloride: 105 mmol/L (ref 98–111)
Creatinine, Ser: 1.44 mg/dL — ABNORMAL HIGH (ref 0.61–1.24)
GFR, Estimated: 52 mL/min — ABNORMAL LOW (ref >60)
Glucose, Bld: 124 mg/dL — ABNORMAL HIGH (ref 70–99)
Potassium: 3.9 mmol/L (ref 3.5–5.1)
Sodium: 135 mmol/L (ref 135–145)
Total Bilirubin: 0.6 mg/dL (ref 0.3–1.2)
Total Protein: 5.6 g/dL — ABNORMAL LOW (ref 6.5–8.1)

## 2020-12-03 LAB — URIC ACID: Uric Acid, Serum: 4.1 mg/dL (ref 3.7–8.6)

## 2020-12-03 NOTE — Plan of Care (Signed)
  Problem: Safety: Goal: Ability to remain free from injury will improve Outcome: Progressing   Problem: Self-Care: Goal: Ability to communicate needs accurately will improve Outcome: Progressing   Problem: Ischemic Stroke/TIA Tissue Perfusion: Goal: Complications of ischemic stroke/TIA will be minimized Outcome: Progressing

## 2020-12-03 NOTE — Plan of Care (Signed)
  Problem: Clinical Measurements: Goal: Ability to maintain clinical measurements within normal limits will improve Outcome: Progressing Goal: Will remain free from infection Outcome: Progressing Goal: Diagnostic test results will improve Outcome: Progressing Goal: Respiratory complications will improve Outcome: Progressing Goal: Cardiovascular complication will be avoided Outcome: Progressing   Problem: Ischemic Stroke/TIA Tissue Perfusion: Goal: Complications of ischemic stroke/TIA will be minimized Outcome: Progressing

## 2020-12-03 NOTE — Progress Notes (Signed)
CSW spoke with [place] at [facility] who states the patient has not been offered a contract and will have to use his Medicare benefits for placement.  Patient is still waiting for a pending Medicaid number before admitting to a facility.  Madilyn Fireman, MSW, LCSW Transitions of Care  Clinical Social Worker II [phone]

## 2020-12-03 NOTE — Progress Notes (Signed)
[facility] PROGRESS NOTE  IRVINE GLORIOSO LPF:[id number] DOB: 1951/01/25 DOA: 10/14/2020 PCP: Pcp, No  Status: Remains inpatient appropriate because:Unsafe d/c plan  Dispo: The patient is from: Home              Anticipated d/c is to: SNF [facility] pending receipt of Medicaid number)              Patient currently is medically stable to d/c.   Difficult to place patient Yes              Barriers to discharge: Needs Medicaid number which is  pending-[facility]-unclear if they were made aware of current admission- does have Medicare Part A   Level of care: Med-Surg   Code Status: DNR Family Communication:  DVT prophylaxis: Eliquis COVID vaccination status: Unknown  HPI: 70 y.o. 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  He was managed with supportive care-hospital course was complicated by worsening somnolence-further imaging studies showed cytotoxic edema with some mass-effect on the right lateral ventricle.  Neurology continued to follow closely-he was managed with continued supportive care.  He continued to exhibit significant left-sided hemiplegia/dysphagia-ultimately required G-tube placement.  Plans are for SNF on discharge.  Pradaxa prescribed prior to this hospitalization but was held due to petechial hemorrhage/severe dysphagia.  Subsequently been transitioned to eliquis.  Lung nodule 8 mm left upper lobe which is stable and can be followed up by PCP after discharge.  On 9/27 received information from his [place] [facility] that he has had acute hepatitis from Eliquis in the past.  Discussed with pharmacist and attending MD and since current LFTs are within normal  limits plan is to continue Eliquis and follow LFTs weekly.   Subjective: Awake.  No specific complaints when asked.  Objective: Vitals:   12/03/20 0008 12/03/20 0349  BP: 137/70 130/75  Pulse: 78   Resp: 18 19  Temp: 98.6 F (37 C) 98.7 F (37.1 C)  SpO2: 100%     Intake/Output Summary (Last 24 hours) at 12/03/2020 0811 Last data filed at 12/02/2020 1630 Gross per 24 hour  Intake 5770 ml  Output 650 ml  Net 5120 ml   Filed Weights   11/30/20 0402 12/01/20 0417 12/02/20 0500  Weight: 85.8 kg 85.6 kg 85 kg    Exam:  Constitutional: NAD, calm, comfortable Respiratory: Anterior lung sounds remain clear to auscultation with normal respiratory effort, room air. Cardiovascular: S1-S2, blood pressure much better controlled today, pulses regular and skin is warm and dry, pulse oximetry 100% Abdomen: Soft, nontender and non-distended, bowel sounds normoactive, LBM 9/29-PEG tube site unremarkable Neurologic: CN 2-12 intact except for documented left visual field deficit with right gaze preference as well as left facial droop.  Continued left side neglect with associated hemiplegia.  RUE in splint.  Right foot with foot drop. Extremities: Offloading boots on both legs.  Soft splint on LUE.  Patient noted with edema involving left hand and wrist.  Hand warm to the touch. Psychiatric: Awake and oriented x 3   Stroke work-up: 8/3>> CT angio head: No intracranial large vessel occlusion 8/3>> CT angio neck: Minimal atherosclerotic change in the bulb internal carotid artery bulbs.  No stenosis. 8/4>> A1c: 5.9 8/4>> LDL: 75 8/4>> Echo: EF  81-01%, grade 1 diastolic dysfunction. 8/11>> CT head: Expected evolution of Rt MCA/PCA territory infarct w petechial hemorrhage/mild edema. 8/11>> MRI brain: Right MCA infarct-increased cytotoxic edema/petechial hemorrhage without midline shift. 8/16>> CT head: Evolving right posterior MCA territory infarct with increased cytotoxic edema.  Moderate  mass-effect on the right lateral ventricle. 8/25>> MRI brain: Progressive swelling in the right hemisphere infarction-minimal petechial blood products.  Small acute infarctions of the inferior basal ganglia on the right.  8/12-10/2011>>LTM EEG: No seizures. 8/12:>>Spot EEG: No seizures    Assessment/Plan: Acute problems: Right MCA/PCA territory infarct with petechial hemorrhage/evolution-with mild edema/slight midline shift on repeat CT head on 8/16:  Stable with dense left hemiplegia and left-sided neglect-unable to ambulate at this time Left foot drop continue splint as well as offloading boots to prevent heel ulcers Continue Skelaxin for spasticity Therapies document slow/incremental progress  Focal left upper extremity edema Likely related to dependent issues from hemiplegia and left-sided neglect If worsens and begins to extend up the arm may need to consider work-up for possible DVT   Possible focal seizure:  Continue Keppra/Topamax.   Headache:  Stable on Topamax.  Probably related to CVA/recent cytotoxic edema.   Fever on 9/10:  Resolved-cultures negative.   Completed 5-day course of cefepime.   HTN:  continue carvedilol/amlodipine/hydralazine prn   HLD:  Continue Lipitor   AKI on CKD stage IIIa:  AKI likely hemodynamically mediated-resolved-creatinine back to baseline of 7.5-1.0   Rate 1 diastolic dysfunction HFpEF:  Euvolemic on exam. Echocardiogram in August revealed normal/hyperdynamic EF with parameters consistent with grade 1 diastolic dysfunction   History of VTE:  Continue Eliquis History of acute hepatitis on Eliquis per information from his [facility]-Discussed with pharmacy and attending physician - since current LFTs are within normal limits plan to follow LFTs weekly   Lung nodule 8 mm left upper lobe- stable for follow-up by PCP.   Dysphagia Nutrition Problem: Inadequate oral intake Etiology: dysphagia Signs/Symptoms: SLP recommending  D1 diet Interventions: Tube feeding, Prostat   Obesity: Estimated body mass index is 30.12 kg/m as calculated from the following:   Height as of this encounter: 5\' 5"  (1.651 m).   Weight as of this encounter: 82.1 kg.     Data Reviewed: Basic Metabolic Panel: Recent Labs  Lab 11/29/20 0009 11/30/20 1621 12/01/20 0900 12/03/20 0344  NA 134* 134*  --  135  K 4.0 3.8  --  3.9  CL 105 105  --  105  CO2 21* 21*  --  20*  GLUCOSE 117* 133*  --  124*  BUN 26* 29*  --  40*  CREATININE 1.33* 1.29*  --  1.44*  CALCIUM 8.5* 8.8*  --  8.4*  MG 1.8  --  1.9  --    Liver Function Tests: Recent Labs  Lab 11/29/20 0009 11/30/20 1621 12/03/20 0344  AST 32 33 35  ALT 36 33 35  ALKPHOS 74 83 67  BILITOT 0.5 0.6 0.6  PROT 6.2* 6.4* 5.6*  ALBUMIN 2.6* 2.7* 2.3*   CBC: Recent Labs  Lab 11/29/20 0009 12/01/20 0900 12/03/20 0344  WBC 8.0 9.6 7.9  HGB 10.5* 11.1* 9.5*  HCT 32.7* 35.5* 30.6*  MCV 80.7 82.4 82.5  PLT 159 190 151    CBG: Recent Labs  Lab 12/02/20 1555 12/02/20 2043 12/03/20 0004 12/03/20 0436 12/03/20 0800  GLUCAP 137* 133* 110* 134* 126*      Scheduled Meds:  apixaban  5 mg Per Tube BID   atorvastatin  40 mg Per Tube Daily   carvedilol  12.5 mg Per Tube BID WC   feeding supplement  237 mL Oral BID BM   feeding supplement (JEVITY 1.5 CAL/FIBER)  660 mL Per Tube Q24H   feeding supplement (PROSource TF)  45 mL Per Tube BID   free water  200 mL Per Tube Q4H   insulin aspart  0-9 Units Subcutaneous Q4H   levETIRAcetam  250 mg Per Tube BID   mouth rinse  15 mL Mouth Rinse BID   metaxalone  400 mg Per Tube TID   modafinil  100 mg Per Tube Daily   nystatin  5 mL Mouth/Throat QID   pantoprazole sodium  40 mg Per Tube Daily   saccharomyces boulardii  250 mg Per Tube BID   topiramate  50 mg Per Tube BID   Continuous Infusions:    Principal Problem:   Left-sided weakness Active Problems:   Stroke ([place])   Essential hypertension   Mixed  hyperlipidemia   Prediabetes   History of pulmonary embolism   Chronic anticoagulation   Nicotine dependence, cigarettes, uncomplicated   Petechial hemorrhage   Acute stroke due to ischemia [facility])   Advanced care planning/counseling discussion   Goals of care, counseling/discussion   Palliative care by specialist   Consultants: Neurology Interventional radiology  Procedures: Cortrack tube EEG PEG tube  Antibiotics: Cefepime 9/10 through 9/14 Vancomycin dose on 9/10   Time spent: 25 minutes    Erin Hearing ANP  [facility] 7 am - 330 pm/M-F for direct patient care and secure chat Please refer to Amion for contact info 50  days

## 2020-12-04 DIAGNOSIS — R531 Weakness: Secondary | ICD-10-CM | POA: Diagnosis not present

## 2020-12-04 LAB — GLUCOSE, CAPILLARY
Glucose-Capillary: 131 mg/dL — ABNORMAL HIGH (ref 70–99)
Glucose-Capillary: 108 mg/dL — ABNORMAL HIGH (ref 70–99)
Glucose-Capillary: 116 mg/dL — ABNORMAL HIGH (ref 70–99)
Glucose-Capillary: 115 mg/dL — ABNORMAL HIGH (ref 70–99)
Glucose-Capillary: 114 mg/dL — ABNORMAL HIGH (ref 70–99)

## 2020-12-04 NOTE — Progress Notes (Signed)
[facility] PROGRESS NOTE  BRIDGER PIZZI WEX:[id number] DOB: [birth year] DOA: 10/14/2020 PCP: Pcp, No  Status: Remains inpatient appropriate because:Unsafe d/c plan  Dispo: The patient is from: Home              Anticipated d/c is to: SNF [facility] pending receipt of Medicaid number)              Patient currently is medically stable to d/c.   Difficult to place patient Yes              Barriers to discharge: Needs Medicaid number which is  pending-[facility]-unclear if they were made aware of current admission- does have Medicare Part A   Level of care: Med-Surg   Code Status: DNR Family Communication:  DVT prophylaxis: Eliquis COVID vaccination status: Unknown  HPI: 70 y.o. 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  He was managed with supportive care-hospital course was complicated by worsening somnolence-further imaging studies showed cytotoxic edema with some mass-effect on the right lateral ventricle.  Neurology continued to follow closely-he was managed with continued supportive care.  He continued to exhibit significant left-sided hemiplegia/dysphagia-ultimately required G-tube placement.  Plans are for SNF on discharge.  Pradaxa prescribed prior to this hospitalization but was held due to petechial hemorrhage/severe dysphagia.  Subsequently been transitioned to eliquis.  Lung nodule 8 mm left upper lobe which is stable and can be followed up by PCP after discharge.  On 9/27 received information from his [place] [facility] that he has had acute hepatitis from Eliquis in the past.  Discussed with pharmacist and attending MD and since current LFTs are within normal  limits plan is to continue Eliquis and follow LFTs weekly.   Subjective:   Patient in bed, appears comfortable, denies any headache, no fever, no chest pain or pressure, no shortness of breath , no abdominal pain. No new focal weakness.   Objective: Vitals:   12/04/20 0424 12/04/20 0808  BP: (!) 156/70 (!) 143/66  Pulse: 84 77  Resp: (!) 24 18  Temp: 98.6 F (37 C) 98.5 F (36.9 C)  SpO2: 100% 99%    Intake/Output Summary (Last 24 hours) at 12/04/2020 0931 Last data filed at 12/04/2020 0423 Gross per 24 hour  Intake 2976 ml  Output 600 ml  Net 2376 ml   Filed Weights   12/01/20 0417 12/02/20 0500 12/04/20 0424  Weight: 85.6 kg 85 kg 85 kg    Exam:  Awake Alert, left-sided hemiplegia with left foot drop and left-sided neglect, wearing left-sided strength and right surgical wound, mitten in right hand, PEG in place [place].AT,PERRAL Supple Neck,No JVD, No cervical lymphadenopathy appriciated.  Symmetrical Chest wall movement, Good air movement bilaterally, CTAB RRR,No Gallops, Rubs or new Murmurs, No Parasternal Heave +ve B.Sounds, Abd Soft, No tenderness,  No Cyanosis, Clubbing or edema, No new Rash or bruise    Stroke work-up: 8/3>> CT angio head: No intracranial large vessel occlusion 8/3>> CT angio neck: Minimal atherosclerotic change in the bulb internal carotid artery bulbs.  No stenosis. 8/4>> A1c: 5.9 8/4>> LDL: 75 8/4>> Echo: EF 93-81%, grade 1 diastolic dysfunction. 8/11>> CT head: Expected evolution of Rt MCA/PCA territory infarct w petechial hemorrhage/mild edema. 8/11>> MRI brain: Right MCA infarct-increased cytotoxic  edema/petechial hemorrhage without midline shift. 8/16>> CT head: Evolving right posterior MCA territory infarct with increased cytotoxic edema.  Moderate mass-effect on the right lateral ventricle. 8/25>> MRI brain: Progressive swelling in the right hemisphere infarction-minimal petechial blood products.  Small acute infarctions of the inferior  basal ganglia on the right.  8/12-10/2011>>LTM EEG: No seizures. 8/12:>>Spot EEG: No seizures    Assessment/Plan:   Right MCA/PCA territory infarct with petechial hemorrhage/evolution-with mild edema/slight midline shift on repeat CT head on 8/16:  Stable with dense left hemiplegia and left-sided neglect-unable to ambulate at this time, also has left foot drop, currently in left sided splints along with right surgical boot.  Continue Skelaxin for spasticity, on Eliquis and statin for secondary prevention.  Focal left upper extremity edema - Likely related to dependent issues from hemiplegia and left-sided neglect, improved with elevation.  Already on Eliquis.    Possible focal seizure:  Continue Keppra/Topamax.   Headache:  Stable on Topamax.  Probably related to CVA/recent cytotoxic edema.   HTN:  continue carvedilol/amlodipine/hydralazine prn   HLD: Continue Lipitor   AKI on CKD stage IIIa:  AKI likely hemodynamically mediated-resolved-creatinine back to baseline of 1.4-1.5   Grade 1 diastolic dysfunction HFpEF EF 65% in 10/2020:  Euvolemic on exam.   History of VTE:  Continue Eliquis, reported allergy of Hepatitis has been clinically ruled out, stable LFTs.  Lung nodule 8 mm left upper lobe - stable for follow-up by PCP and outpt Pulm. Follow up in 2-3 weeks post DC.   Dysphagia - due to stroke, on dysphagia 1 diet along with tube feeds.  Nutritionist following.  Obesity: BMI 31- follow with PCP post DC.    Data Reviewed: Basic Metabolic Panel: Recent Labs  Lab 11/29/20 0009 11/30/20 1621 12/01/20 0900 12/03/20 0344  NA 134* 134*  --  135  K 4.0 3.8  --  3.9  CL 105 105  --  105  CO2 21* 21*  --  20*  GLUCOSE 117* 133*  --  124*  BUN 26* 29*  --  40*  CREATININE 1.33* 1.29*  --  1.44*  CALCIUM 8.5* 8.8*  --  8.4*  MG 1.8  --  1.9  --    Liver Function Tests: Recent Labs  Lab 11/29/20 0009 11/30/20 1621 12/03/20 0344  AST 32 33 35  ALT 36 33 35  ALKPHOS 74  83 67  BILITOT 0.5 0.6 0.6  PROT 6.2* 6.4* 5.6*  ALBUMIN 2.6* 2.7* 2.3*   CBC: Recent Labs  Lab 11/29/20 0009 12/01/20 0900 12/03/20 0344  WBC 8.0 9.6 7.9  HGB 10.5* 11.1* 9.5*  HCT 32.7* 35.5* 30.6*  MCV 80.7 82.4 82.5  PLT 159 190 151    CBG: Recent Labs  Lab 12/03/20 1625 12/03/20 2026 12/03/20 2344 12/04/20 0415 12/04/20 0804  GLUCAP 131* 135* 104* 131* 108*    Scheduled Meds:  apixaban  5 mg Per Tube BID   atorvastatin  40 mg Per Tube Daily   carvedilol  12.5 mg Per Tube BID WC   feeding supplement  237 mL Oral BID BM   feeding supplement (JEVITY 1.5 CAL/FIBER)  660 mL Per Tube Q24H   feeding supplement (PROSource TF)  45 mL Per Tube BID   free water  200 mL Per Tube Q4H   insulin aspart  0-9 Units Subcutaneous Q4H   levETIRAcetam  250 mg Per Tube BID   mouth rinse  15 mL Mouth Rinse BID   metaxalone  400 mg  Per Tube TID   modafinil  100 mg Per Tube Daily   nystatin  5 mL Mouth/Throat QID   pantoprazole sodium  40 mg Per Tube Daily   saccharomyces boulardii  250 mg Per Tube BID   topiramate  50 mg Per Tube BID   Continuous Infusions:  Principal Problem:   Left-sided weakness Active Problems:   Stroke [facility])   Essential hypertension   Mixed hyperlipidemia   Prediabetes   History of pulmonary embolism   Chronic anticoagulation   Nicotine dependence, cigarettes, uncomplicated   Petechial hemorrhage   Acute stroke due to ischemia [facility])   Advanced care planning/counseling discussion   Goals of care, counseling/discussion   Palliative care by specialist   Consultants: Neurology Interventional radiology  Procedures: Cortrack tube EEG PEG tube  Antibiotics: Cefepime 9/10 through 9/14 Vancomycin dose on 9/10 Time spent: 25 minutes 51  days  Signature  Lala Lund M.D on 12/04/2020 at 9:31 AM   -  To page go to [email or web address]

## 2020-12-04 NOTE — Plan of Care (Signed)
  Problem: Education: Goal: Knowledge of disease or condition will improve Outcome: Progressing Goal: Knowledge of secondary prevention will improve Outcome: Progressing Goal: Knowledge of patient specific risk factors addressed and post discharge goals established will improve Outcome: Progressing   Problem: Coping: Goal: Will verbalize positive feelings about self Outcome: Progressing   Problem: Health Behavior/Discharge Planning: Goal: Ability to manage health-related needs will improve Outcome: Progressing   Problem: Self-Care: Goal: Ability to participate in self-care as condition permits will improve Outcome: Progressing   Problem: Nutrition: Goal: Risk of aspiration will decrease Outcome: Progressing   Problem: Intracerebral Hemorrhage Tissue Perfusion: Goal: Complications of Intracerebral Hemorrhage will be minimized Outcome: Progressing   Problem: Ischemic Stroke/TIA Tissue Perfusion: Goal: Complications of ischemic stroke/TIA will be minimized Outcome: Progressing

## 2020-12-05 DIAGNOSIS — R531 Weakness: Secondary | ICD-10-CM | POA: Diagnosis not present

## 2020-12-05 LAB — GLUCOSE, CAPILLARY
Glucose-Capillary: 117 mg/dL — ABNORMAL HIGH (ref 70–99)
Glucose-Capillary: 120 mg/dL — ABNORMAL HIGH (ref 70–99)
Glucose-Capillary: 123 mg/dL — ABNORMAL HIGH (ref 70–99)
Glucose-Capillary: 123 mg/dL — ABNORMAL HIGH (ref 70–99)
Glucose-Capillary: 126 mg/dL — ABNORMAL HIGH (ref 70–99)
Glucose-Capillary: 127 mg/dL — ABNORMAL HIGH (ref 70–99)
Glucose-Capillary: 131 mg/dL — ABNORMAL HIGH (ref 70–99)

## 2020-12-05 NOTE — Plan of Care (Signed)
  Problem: Education: Goal: Knowledge of disease or condition will improve Outcome: Progressing Goal: Knowledge of secondary prevention will improve Outcome: Progressing Goal: Knowledge of patient specific risk factors addressed and post discharge goals established will improve Outcome: Progressing   Problem: Coping: Goal: Will verbalize positive feelings about self Outcome: Progressing Goal: Will identify appropriate support needs Outcome: Progressing   Problem: Health Behavior/Discharge Planning: Goal: Ability to manage health-related needs will improve Outcome: Progressing   Problem: Self-Care: Goal: Ability to participate in self-care as condition permits will improve Outcome: Progressing Goal: Verbalization of feelings and concerns over difficulty with self-care will improve Outcome: Progressing   Problem: Nutrition: Goal: Risk of aspiration will decrease Outcome: Progressing Goal: Dietary intake will improve Outcome: Progressing   Problem: Ischemic Stroke/TIA Tissue Perfusion: Goal: Complications of ischemic stroke/TIA will be minimized Outcome: Progressing

## 2020-12-05 NOTE — Progress Notes (Signed)
[facility] PROGRESS NOTE  Clifford [name] TGP:[id number] DOB: 06-May-1950 DOA: 10/14/2020 PCP: Pcp, No  Status: Remains inpatient appropriate because:Unsafe d/c plan  Dispo: The patient is from: Home              Anticipated d/c is to: SNF [facility] pending receipt of Medicaid number)              Patient currently is medically stable to d/c.   Difficult to place patient Yes              Barriers to discharge: Needs Medicaid number which is  pending-[facility]-unclear if they were made aware of current admission- does have Medicare Part A   Level of care: Med-Surg   Code Status: DNR Family Communication:  DVT prophylaxis: Eliquis COVID vaccination status: Unknown  HPI: 70 y.o. 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  He was managed with supportive care-hospital course was complicated by worsening somnolence-further imaging studies showed cytotoxic edema with some mass-effect on the right lateral ventricle.  Neurology continued to follow closely-he was managed with continued supportive care.  He continued to exhibit significant left-sided hemiplegia/dysphagia-ultimately required G-tube placement.  Plans are for SNF on discharge.  Pradaxa prescribed prior to this hospitalization but was held due to petechial hemorrhage/severe dysphagia.  Subsequently been transitioned to eliquis.  Lung nodule 8 mm left upper lobe which is stable and can be followed up by PCP after discharge.  On 9/27 received information from his [place] [facility] that he has had acute hepatitis from Eliquis in the past.  Discussed with pharmacist and attending MD and since current LFTs are within normal  limits plan is to continue Eliquis and follow LFTs weekly.   Subjective:   Patient in bed, appears comfortable, denies any headache, no fever, no chest pain or pressure, no shortness of breath , no abdominal pain. No new focal weakness.  Objective: Vitals:   12/05/20 0416 12/05/20 0806  BP: (!) 147/63 (!) 167/76  Pulse: 66 81  Resp: 20 17  Temp: 98.9 F (37.2 C) 99 F (37.2 C)  SpO2: 100% 100%    Intake/Output Summary (Last 24 hours) at 12/05/2020 0904 Last data filed at 12/05/2020 0001 Gross per 24 hour  Intake 1204 ml  Output 1175 ml  Net 29 ml   Filed Weights   12/02/20 0500 12/04/20 0424 12/05/20 0416  Weight: 85 kg 85 kg 85 kg    Exam:  Awake , left-sided hemiplegia with left foot drop and left-sided neglect, wearing left-sided strength and right surgical wound, mitten in right hand, PEG in place [place].AT,  Supple Neck,No JVD, No cervical lymphadenopathy appriciated.  Symmetrical Chest wall movement, Good air movement bilaterally, CTAB RRR,No Gallops, Rubs or new Murmurs, No Parasternal Heave +ve B.Sounds, Abd Soft, No tenderness,   No Cyanosis     Stroke work-up: 8/3>> CT angio head: No intracranial large vessel occlusion 8/3>> CT angio neck: Minimal atherosclerotic change in the bulb internal carotid artery bulbs.  No stenosis. 8/4>> A1c: 5.9 8/4>> LDL: 75 8/4>> Echo: EF 30-94%, grade 1 diastolic dysfunction. 8/11>> CT head: Expected evolution of Rt MCA/PCA territory infarct w petechial hemorrhage/mild edema. 8/11>> MRI brain: Right MCA infarct-increased cytotoxic edema/petechial hemorrhage without midline shift. 8/16>> CT  head: Evolving right posterior MCA territory infarct with increased cytotoxic edema.  Moderate mass-effect on the right lateral ventricle. 8/25>> MRI brain: Progressive swelling in the right hemisphere infarction-minimal petechial blood products.  Small acute infarctions of the inferior basal ganglia on the right.  8/12-10/2011>>LTM EEG: No  seizures. 8/12:>>Spot EEG: No seizures    Assessment/Plan:   Right MCA/PCA territory infarct with petechial hemorrhage/evolution-with mild edema/slight midline shift on repeat CT head on 8/16:  Stable with dense left hemiplegia and left-sided neglect-unable to ambulate at this time, also has left foot drop, currently in left sided splints along with right surgical boot.  Continue Skelaxin for spasticity, on Eliquis and statin for secondary prevention.  Focal left upper extremity edema - Likely related to dependent issues from hemiplegia and left-sided neglect, elevation ordered.  Already on Eliquis.    Possible focal seizure:  Continue Keppra/Topamax.   Headache:  Stable on Topamax.  Probably related to CVA/recent cytotoxic edema.   HTN:  continue carvedilol/amlodipine/hydralazine prn   HLD: Continue Lipitor   AKI on CKD stage IIIa:  AKI likely hemodynamically mediated-resolved-creatinine back to baseline of 1.4-1.5   Grade 1 diastolic dysfunction HFpEF EF 65% in 10/2020:  Euvolemic on exam.   History of VTE:  Continue Eliquis, reported allergy of Hepatitis has been clinically ruled out, stable LFTs.  Lung nodule 8 mm left upper lobe - stable for follow-up by PCP and outpt Pulm. Follow up in 2-3 weeks post DC.   Dysphagia - due to stroke, on dysphagia 1 diet along with tube feeds.  Nutritionist following.  Obesity: BMI 31- follow with PCP post DC.    Data Reviewed: Basic Metabolic Panel: Recent Labs  Lab 11/29/20 0009 11/30/20 1621 12/01/20 0900 12/03/20 0344  NA 134* 134*  --  135  K 4.0 3.8  --  3.9  CL 105 105  --  105  CO2 21* 21*  --  20*  GLUCOSE 117* 133*  --  124*  BUN 26* 29*  --  40*  CREATININE 1.33* 1.29*  --  1.44*  CALCIUM 8.5* 8.8*  --  8.4*  MG 1.8  --  1.9  --    Liver Function Tests: Recent Labs  Lab 11/29/20 0009 11/30/20 1621 12/03/20 0344  AST 32 33 35  ALT 36 33 35  ALKPHOS 74 83 67  BILITOT 0.5 0.6 0.6  PROT 6.2* 6.4* 5.6*  ALBUMIN  2.6* 2.7* 2.3*   CBC: Recent Labs  Lab 11/29/20 0009 12/01/20 0900 12/03/20 0344  WBC 8.0 9.6 7.9  HGB 10.5* 11.1* 9.5*  HCT 32.7* 35.5* 30.6*  MCV 80.7 82.4 82.5  PLT 159 190 151    CBG: Recent Labs  Lab 12/04/20 1605 12/04/20 1926 12/04/20 2359 12/05/20 0418 12/05/20 0809  GLUCAP 115* 114* 123* 120* 127*    Scheduled Meds:  apixaban  5 mg Per Tube BID   atorvastatin  40 mg Per Tube Daily   carvedilol  12.5 mg Per Tube BID WC   feeding supplement  237 mL Oral BID BM   feeding supplement (JEVITY 1.5 CAL/FIBER)  660 mL Per Tube Q24H   feeding supplement (PROSource TF)  45 mL Per Tube BID   free water  200 mL Per Tube Q4H   insulin aspart  0-9 Units Subcutaneous Q4H   levETIRAcetam  250 mg Per Tube BID   mouth rinse  15 mL Mouth Rinse BID   metaxalone  400 mg Per Tube TID   modafinil  100  mg Per Tube Daily   pantoprazole sodium  40 mg Per Tube Daily   saccharomyces boulardii  250 mg Per Tube BID   topiramate  50 mg Per Tube BID   Continuous Infusions:  Principal Problem:   Left-sided weakness Active Problems:   Stroke [facility])   Essential hypertension   Mixed hyperlipidemia   Prediabetes   History of pulmonary embolism   Chronic anticoagulation   Nicotine dependence, cigarettes, uncomplicated   Petechial hemorrhage   Acute stroke due to ischemia [facility])   Advanced care planning/counseling discussion   Goals of care, counseling/discussion   Palliative care by specialist   Consultants: Neurology Interventional radiology  Procedures: Cortrack tube EEG PEG tube  Antibiotics: Cefepime 9/10 through 9/14 Vancomycin dose on 9/10 Time spent: 25 minutes 52  days  Signature  Lala Lund M.D on 12/05/2020 at 9:04 AM   -  To page go to [email or web address]

## 2020-12-06 DIAGNOSIS — R531 Weakness: Secondary | ICD-10-CM | POA: Diagnosis not present

## 2020-12-06 LAB — COMPREHENSIVE METABOLIC PANEL
ALT: 37 U/L (ref 0–44)
AST: 37 U/L (ref 15–41)
Albumin: 2.6 g/dL — ABNORMAL LOW (ref 3.5–5.0)
Alkaline Phosphatase: 72 U/L (ref 38–126)
Anion gap: 9 (ref 5–15)
BUN: 33 mg/dL — ABNORMAL HIGH (ref 8–23)
CO2: 21 mmol/L — ABNORMAL LOW (ref 22–32)
Calcium: 8.8 mg/dL — ABNORMAL LOW (ref 8.9–10.3)
Chloride: 105 mmol/L (ref 98–111)
Creatinine, Ser: 1.29 mg/dL — ABNORMAL HIGH (ref 0.61–1.24)
GFR, Estimated: 59 mL/min — ABNORMAL LOW (ref >60)
Glucose, Bld: 116 mg/dL — ABNORMAL HIGH (ref 70–99)
Potassium: 3.9 mmol/L (ref 3.5–5.1)
Sodium: 135 mmol/L (ref 135–145)
Total Bilirubin: 0.5 mg/dL (ref 0.3–1.2)
Total Protein: 6.3 g/dL — ABNORMAL LOW (ref 6.5–8.1)

## 2020-12-06 LAB — GLUCOSE, CAPILLARY
Glucose-Capillary: 124 mg/dL — ABNORMAL HIGH (ref 70–99)
Glucose-Capillary: 114 mg/dL — ABNORMAL HIGH (ref 70–99)
Glucose-Capillary: 122 mg/dL — ABNORMAL HIGH (ref 70–99)
Glucose-Capillary: 124 mg/dL — ABNORMAL HIGH (ref 70–99)
Glucose-Capillary: 138 mg/dL — ABNORMAL HIGH (ref 70–99)

## 2020-12-06 LAB — CBC
HCT: 31.4 % — ABNORMAL LOW (ref 39.0–52.0)
Hemoglobin: 9.9 g/dL — ABNORMAL LOW (ref 13.0–17.0)
MCH: 25.8 pg — ABNORMAL LOW (ref 26.0–34.0)
MCHC: 31.5 g/dL (ref 30.0–36.0)
MCV: 82 fL (ref 80.0–100.0)
Platelets: 176 10*3/uL (ref 150–400)
RBC: 3.83 MIL/uL — ABNORMAL LOW (ref 4.22–5.81)
RDW: 15.6 % — ABNORMAL HIGH (ref 11.5–15.5)
WBC: 6.2 10*3/uL (ref 4.0–10.5)
nRBC: 0 % (ref 0.0–0.2)

## 2020-12-06 LAB — PHOSPHORUS: Phosphorus: 3.8 mg/dL (ref 2.5–4.6)

## 2020-12-06 LAB — MAGNESIUM: Magnesium: 2 mg/dL (ref 1.7–2.4)

## 2020-12-06 NOTE — Progress Notes (Signed)
Physical Therapy Treatment Patient Details Name: Clifford [name] MRN: [id number] DOB: [birth year] Today's Date: 12/06/2020   History of Present Illness 69 yo male presented to ED on 8/11 for R sided headache and worsening L UE weakness. Patient recently admitted 8/3-8/5 for R CVA with L visual deficits and L sided weakness. MRI with mild enlargement of R MCA/border zone infarct since 10/07/20 MRI and increased cytotoxic edema and petechial hemorrhage without midline shift. Palliative Care consulted and in detailed family discussions, with prognosis provided by Neurology (more likely able to regain ability to talk and participate in self cares, unlikely to regain ability to walk, uncertain if able to regain ability to swallow, very unlikely to improve to full independence), it was decided to place PEG and pursue SNF rehab, PEG was placed 8/30. 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    PT Comments    Pt more alert today compared to previous sessions however remains very impaired with incresaed tone in L UE and LE, no active movement and minimal sensation. Worked on EOB balance today and ROM To neck and trunk. Acute PT to cont to follow. Pt will need SNF upon d/c.    Recommendations for follow up therapy are one component of a multi-disciplinary discharge planning process, led by the attending physician.  Recommendations may be updated based on patient status, additional functional criteria and insurance authorization.  Follow Up Recommendations  SNF;Supervision/Assistance - 24 hour     Equipment Recommendations   (TBD at next venue)    Recommendations for Other Services       Precautions / Restrictions Precautions Precautions: Fall Precaution Comments: L visual field deficit, R gaze preference, L inattention, L hemparesis; G-tube and abdominal binder; peri skin breakdown Splint/Cast: soft elbow splint, prefab resting hand splint Other Brace: prevalon boots, L elbow splint and resting  hand splint Restrictions Weight Bearing Restrictions: No Other Position/Activity Restrictions: pt now with peg tube     Mobility  Bed Mobility Overal bed mobility: Needs Assistance Bed Mobility: Supine to Sit;Sit to Supine     Supine to sit: Max assist;+2 for physical assistance Sit to supine: Total assist;+2 for physical assistance   General bed mobility comments: pt able to move R LE towards EOB but dependent for L LE movement off EOB, pt did used R UE to pull up on PT to assist with trunk elevation but ultimately required maxAX2 to complete transfer to/from EOB    Transfers                 General transfer comment: deferred  Ambulation/Gait                 Stairs             Wheelchair Mobility    Modified Rankin (Stroke Patients Only) Modified Rankin (Stroke Patients Only) Pre-Morbid Rankin Score: Moderately severe disability Modified Rankin: Severe disability     Balance Overall balance assessment: Needs assistance Sitting-balance support: Single extremity supported;Feet supported Sitting balance-Leahy Scale: Poor Sitting balance - Comments: pt pushing with R UE trunk to the L when used to support self, maxA to maintain EOB balance Postural control: Posterior lean;Left lateral lean                                  Cognition Arousal/Alertness: Awake/alert Behavior During Therapy: Flat affect Overall Cognitive Status: Impaired/Different from baseline Area of Impairment: Attention;Memory;Following  commands;Safety/judgement;Awareness;Problem solving                   Current Attention Level: Sustained Memory: Decreased short-term memory Following Commands: Follows one step commands inconsistently;Follows one step commands with increased time Safety/Judgement: Decreased awareness of deficits Awareness: Emergent Problem Solving: Slow processing;Decreased initiation;Requires tactile cues General Comments: pt more alert,  interactive, and verbal compared to previous sessions      Exercises General Exercises - Lower Extremity Long Arc Quad: Right;10 reps;Seated (PROM to the L in sitting) Hip Flexion/Marching: AROM;Right;10 reps;Seated (PROM to the left) Other Exercises Other Exercises: worked on trunk flexion via reaching with R UE towards toes Other Exercises: attempted to have pt find balance at EOB wtih multiple tactile and verbal cues however pt unable    General Comments General comments (skin integrity, edema, etc.): VSS      Pertinent Vitals/Pain Pain Assessment: Faces Faces Pain Scale: Hurts a little bit Pain Location: grimacing with ROM to neck    Home Living                      Prior Function            PT Goals (current goals can now be found in the care plan section) Acute Rehab PT Goals PT Goal Formulation: With patient Time For Goal Achievement: 12/20/20 Potential to Achieve Goals: Poor Progress towards PT goals: Progressing toward goals    Frequency    Min 2X/week      PT Plan Current plan remains appropriate    Co-evaluation              AM-PAC PT "6 Clicks" Mobility   Outcome Measure  Help needed turning from your back to your side while in a flat bed without using bedrails?: Total Help needed moving from lying on your back to sitting on the side of a flat bed without using bedrails?: Total Help needed moving to and from a bed to a chair (including a wheelchair)?: Total Help needed standing up from a chair using your arms (e.g., wheelchair or bedside chair)?: Total Help needed to walk in hospital room?: Total Help needed climbing 3-5 steps with a railing? : Total 6 Click Score: 6    End of Session   Activity Tolerance: Patient tolerated treatment well Patient left: in bed;with call [place]/phone within reach;with bed alarm set Nurse Communication: Mobility status PT Visit Diagnosis: Muscle weakness (generalized) (M62.81);Other abnormalities of  gait and mobility (R26.89);Hemiplegia and hemiparesis     Time: 1202-1220 PT Time Calculation (min) (ACUTE ONLY): 18 min  Charges:  $Neuromuscular Re-education: 8-22 mins                     Kittie Plater, PT, DPT Acute Rehabilitation Services Pager #: [phone] Office #: [phone]    [place] Lopes 12/06/2020, 1:58 PM

## 2020-12-06 NOTE — Progress Notes (Signed)
Speech Language Pathology Treatment: Dysphagia;Cognitive-Linquistic  Patient Details Name: Clifford [name] MRN: [id number] DOB: April 28, 1950 Today's Date: 12/06/2020 Time: 1000-1023 SLP Time Calculation (min) (ACUTE ONLY): 23 min  Assessment / Plan / Recommendation Clinical Impression  Pt was seen for swallowing/speech/cognitive therapy targeting upgraded PO trials, speech intelligibility, and attention, orientation, and problem solving. SLP observed pt with thin liquid and Dys2 solids. With both consistencies, pt exhibited prolonged oral phase c/b oral holding/swishing with liquid and holding/ineffective mastication with solid, requiring 2 verbal cues for swallow of liquid and removal of solid from oral cavity. Bolus awareness significantly reduced overall. RN reports pt experiences anterior spillage/loss ~50% of boluses. Recommend continue with Dys1/thin liquid diet, administering medication via alternative means.   Overall, pt cognition c/b impaired attention and initation and requires increased time and verbal cues to provide answers to orientation questions. Speech intelligibility ~60% at the word and phrase level this date. SLP provided 3 verbal cues suggesting misunderstanding eliciting no change in intelligibility from pt. Pt required 2 verbal cues to recall state of birth and 3 verbal cues to recall state of current address. Pt able to recall name and his reason for being in [place] independently. When asked how to call the nurse into the room, pt successfully identified the call button independently. SLP completed menu scanning task with pt requesting grits for breakfast. Pt accurately scanned menu for 'breakfast' given 3 verbal cues after removing 2/3 menu pages from line of sight. Pt unable to find 'grits' despite 3 verbal cues and visual cue for section. SLP will continue to follow for swallowing, speech, and cognitive therapy.    HPI HPI: 70yo male admitted 10/14/20 with right side headache and LUE  weakness. 10/19/20 pt was noted to have increased LUE weakness. 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  Recommendations may be updated based on patient status, additional functional criteria and insurance authorization.    Recommendations  Diet recommendations: Dysphagia 1 (puree);Thin liquid Liquids provided via: Straw Medication Administration: Via alternative means Supervision: Full supervision/cueing for compensatory strategies;Staff to assist with self feeding Compensations: Minimize environmental distractions;Slow rate;Small sips/bites;Monitor for anterior loss;Lingual sweep for clearance of pocketing Postural Changes and/or Swallow Maneuvers: Seated upright 90 degrees;Upright 30-60 min after meal                Oral Care Recommendations: Oral care BID Follow up Recommendations: Skilled Nursing facility SLP Visit Diagnosis: Cognitive communication deficit (R41.841);Dysarthria and anarthria (R47.1);Dysphagia, oropharyngeal phase (R13.12) Plan: Continue with current plan of care       GO              Dewitt Rota, SLP-Student   Dewitt Rota  12/06/2020, 10:46 AM

## 2020-12-06 NOTE — Progress Notes (Signed)
[facility] PROGRESS NOTE  Clifford [name] XBW:[id number] DOB: [birth year] DOA: 10/14/2020 PCP: Pcp, No  Status: Remains inpatient appropriate because:Unsafe d/c plan  Dispo: The patient is from: Home              Anticipated d/c is to: SNF [facility] pending receipt of Medicaid number)              Patient currently is medically stable to d/c.   Difficult to place patient Yes              Barriers to discharge: Needs Medicaid number which is  pending-[facility]-unclear if they were made aware of current admission- does have Medicare Part A   Level of care: Med-Surg   Code Status: DNR Family Communication: No family at bedside DVT prophylaxis: Eliquis COVID vaccination status: Unknown  HPI: 70 y.o. 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  He was managed with supportive care-hospital course was complicated by worsening somnolence-further imaging studies showed cytotoxic edema with some mass-effect on the right lateral ventricle.  Neurology continued to follow closely-he was managed with continued supportive care.  He continued to exhibit significant left-sided hemiplegia/dysphagia-ultimately required G-tube placement.  Plans are for SNF on discharge.  Pradaxa prescribed prior to this hospitalization but was held due to petechial hemorrhage/severe dysphagia.  Subsequently been transitioned to eliquis.  Lung nodule 8 mm left upper lobe which is stable and can be followed up by PCP after discharge.  On 9/27 received information from his [place] [facility] that he has had acute hepatitis from Eliquis in the past.  Discussed with pharmacist and attending MD and since current LFTs  are within normal limits plan is to continue Eliquis and follow LFTs weekly.   Subjective: Patient awake and alert.  States he needs to void.  Staff came into the room and reported they were aware.  After staff left the room patient told me he was nauseated and felt like he needed to vomit.  Head of bed elevated and patient given emesis basin as well as a bath basin in the event he begins to vomit after I leave the room.  Objective: Vitals:   12/05/20 2312 12/06/20 0408  BP: (!) 121/57 130/75  Pulse: 61 79  Resp: 15 15  Temp: (!) 97.3 F (36.3 C) 98.2 F (36.8 C)  SpO2: 100% 99%    Intake/Output Summary (Last 24 hours) at 12/06/2020 1638 Last data filed at 12/06/2020 4536 Gross per 24 hour  Intake 1582 ml  Output 2025 ml  Net -443 ml   Filed Weights   12/04/20 0424 12/05/20 0416 12/06/20 0504  Weight: 85 kg 85 kg 85.1 kg    Exam:  Constitutional: NAD, calm, uncomfortable can [place] to reported nausea Respiratory: Lungs are clear to auscultation, initially found flat in bed and no increased work of breathing, room air, 9100% Cardiovascular: S1-S2, blood pressure much better controlled today, pulses regular and skin is warm and dry, pulse oximetry 100% Abdomen: Soft, nontender and non-distended, bowel sounds normoactive, LBM/02-PEG tube site unremarkable-oral intake remains variable Neurologic: CN 2-12 intact except for documented left visual field deficit with right gaze preference as well as left facial droop.  Continued left side neglect with associated hemiplegia.  RUE in  splint.  Right foot with foot drop. Extremities: Offloading boots on both legs.  Soft splint on LUE.  Patient noted with edema involving left hand and wrist.  Hand warm to the touch. Psychiatric: Awake and oriented x 3   Stroke work-up: 8/3>> CT angio head: No intracranial large vessel occlusion 8/3>> CT angio neck: Minimal atherosclerotic change in the bulb internal carotid artery bulbs.  No stenosis. 8/4>>  A1c: 5.9 8/4>> LDL: 75 8/4>> Echo: EF 01-77%, grade 1 diastolic dysfunction. 8/11>> CT head: Expected evolution of Rt MCA/PCA territory infarct w petechial hemorrhage/mild edema. 8/11>> MRI brain: Right MCA infarct-increased cytotoxic edema/petechial hemorrhage without midline shift. 8/16>> CT head: Evolving right posterior MCA territory infarct with increased cytotoxic edema.  Moderate mass-effect on the right lateral ventricle. 8/25>> MRI brain: Progressive swelling in the right hemisphere infarction-minimal petechial blood products.  Small acute infarctions of the inferior basal ganglia on the right.  8/12-10/2011>>LTM EEG: No seizures. 8/12:>>Spot EEG: No seizures    Assessment/Plan: Acute problems: Right MCA/PCA territory infarct with petechial hemorrhage/evolution-with mild edema/slight midline shift on repeat CT head on 8/16:  Stable with dense left hemiplegia and left-sided neglect-unable to ambulate at this time Left foot drop -continue splint /offloading boots as well as soft splint to LUE to minimize contracture Continue Skelaxin for spasticity Therapies document slow/incremental progress  Focal left upper extremity edema Likely related to dependent issues from hemiplegia and left-sided neglect As of 10/3 has significantly improved   Possible focal seizure:  Continue Keppra/Topamax.   Headache:  Stable on Topamax.  Probably related to CVA/recent cytotoxic edema.   Fever on 9/10:  Resolved-cultures negative.   Completed 5-day course of cefepime.   HTN:  continue carvedilol/amlodipine/hydralazine prn   HLD:  Continue Lipitor   AKI on CKD stage IIIa:  AKI likely hemodynamically mediated-resolved-creatinine back to baseline of 9.3-9.0   Rate 1 diastolic dysfunction HFpEF:  Euvolemic on exam. Echocardiogram in August revealed normal/hyperdynamic EF with parameters consistent with grade 1 diastolic dysfunction   History of VTE:  Continue Eliquis History of acute  hepatitis 2/2 Eliquis-current LFTs have remained stable on Eliquis   Lung nodule 8 mm left upper lobe- stable for follow-up by PCP.   Dysphagia Nutrition Problem: Inadequate oral intake Etiology: dysphagia Signs/Symptoms: SLP recommending D1 diet Interventions: Tube feeding, Prostat   Obesity: Estimated body mass index is 30.12 kg/m as calculated from the following:   Height as of this encounter: 5\' 5"  (1.651 m).   Weight as of this encounter: 82.1 kg.     Data Reviewed: Basic Metabolic Panel: Recent Labs  Lab 11/30/20 1621 12/01/20 0900 12/03/20 0344 12/06/20 0341  NA 134*  --  135 135  K 3.8  --  3.9 3.9  CL 105  --  105 105  CO2 21*  --  20* 21*  GLUCOSE 133*  --  124* 116*  BUN 29*  --  40* 33*  CREATININE 1.29*  --  1.44* 1.29*  CALCIUM 8.8*  --  8.4* 8.8*  MG  --  1.9  --  2.0  PHOS  --   --   --  3.8   Liver Function Tests: Recent Labs  Lab 11/30/20 1621 12/03/20 0344 12/06/20 0341  AST 33 35 37  ALT 33 35 37  ALKPHOS 83 67 72  BILITOT 0.6 0.6 0.5  PROT 6.4* 5.6* 6.3*  ALBUMIN 2.7* 2.3* 2.6*   CBC: Recent Labs  Lab 12/01/20 0900 12/03/20 0344 12/06/20 0341  WBC 9.6 7.9  6.2  HGB 11.1* 9.5* 9.9*  HCT 35.5* 30.6* 31.4*  MCV 82.4 82.5 82.0  PLT 190 151 176    CBG: Recent Labs  Lab 12/05/20 1223 12/05/20 1650 12/05/20 1949 12/05/20 2311 12/06/20 0409  GLUCAP 131* 123* 126* 117* 124*      Scheduled Meds:  apixaban  5 mg Per Tube BID   atorvastatin  40 mg Per Tube Daily   carvedilol  12.5 mg Per Tube BID WC   feeding supplement  237 mL Oral BID BM   feeding supplement (JEVITY 1.5 CAL/FIBER)  660 mL Per Tube Q24H   feeding supplement (PROSource TF)  45 mL Per Tube BID   free water  200 mL Per Tube Q4H   insulin aspart  0-9 Units Subcutaneous Q4H   levETIRAcetam  250 mg Per Tube BID   mouth rinse  15 mL Mouth Rinse BID   metaxalone  400 mg Per Tube TID   modafinil  100 mg Per Tube Daily   pantoprazole sodium  40 mg Per Tube Daily    saccharomyces boulardii  250 mg Per Tube BID   topiramate  50 mg Per Tube BID   Continuous Infusions:    Principal Problem:   Left-sided weakness Active Problems:   Stroke ([place])   Essential hypertension   Mixed hyperlipidemia   Prediabetes   History of pulmonary embolism   Chronic anticoagulation   Nicotine dependence, cigarettes, uncomplicated   Petechial hemorrhage   Acute stroke due to ischemia [facility])   Advanced care planning/counseling discussion   Goals of care, counseling/discussion   Palliative care by specialist   Consultants: Neurology Interventional radiology  Procedures: Cortrack tube EEG PEG tube  Antibiotics: Cefepime 9/10 through 9/14 Vancomycin dose on 9/10   Time spent: 25 minutes    Erin Hearing ANP  [facility] 7 am - 330 pm/M-F for direct patient care and secure chat Please refer to Amion for contact info 53  days

## 2020-12-06 NOTE — Plan of Care (Signed)
  Problem: Education: Goal: Knowledge of disease or condition will improve Outcome: Progressing Goal: Knowledge of secondary prevention will improve Outcome: Progressing   Problem: Coping: Goal: Will verbalize positive feelings about self Outcome: Progressing Goal: Will identify appropriate support needs Outcome: Progressing   Problem: Health Behavior/Discharge Planning: Goal: Ability to manage health-related needs will improve Outcome: Progressing   Problem: Self-Care: Goal: Ability to participate in self-care as condition permits will improve Outcome: Progressing   Problem: Nutrition: Goal: Risk of aspiration will decrease Outcome: Progressing   Problem: Ischemic Stroke/TIA Tissue Perfusion: Goal: Complications of ischemic stroke/TIA will be minimized Outcome: Progressing

## 2020-12-07 DIAGNOSIS — R531 Weakness: Secondary | ICD-10-CM | POA: Diagnosis not present

## 2020-12-07 LAB — GLUCOSE, CAPILLARY
Glucose-Capillary: 142 mg/dL — ABNORMAL HIGH (ref 70–99)
Glucose-Capillary: 121 mg/dL — ABNORMAL HIGH (ref 70–99)
Glucose-Capillary: 109 mg/dL — ABNORMAL HIGH (ref 70–99)
Glucose-Capillary: 110 mg/dL — ABNORMAL HIGH (ref 70–99)
Glucose-Capillary: 137 mg/dL — ABNORMAL HIGH (ref 70–99)

## 2020-12-07 MED ORDER — FAMOTIDINE 40 MG/5ML PO SUSR
20.0000 mg | Freq: Two times a day (BID) | ORAL | Status: DC
  Administered 2020-12-07 – 2020-12-14 (×15): 20 mg
  Filled 2020-12-07 (×16): qty 2.5

## 2020-12-07 MED ORDER — METAXALONE 800 MG PO TABS
800.0000 mg | ORAL_TABLET | Freq: Three times a day (TID) | ORAL | Status: DC
  Administered 2020-12-07 – 2020-12-14 (×22): 800 mg
  Filled 2020-12-07 (×24): qty 1

## 2020-12-07 MED ORDER — PANTOPRAZOLE 2 MG/ML SUSPENSION
40.0000 mg | Freq: Two times a day (BID) | ORAL | Status: DC
  Administered 2020-12-07 – 2020-12-14 (×15): 40 mg
  Filled 2020-12-07 (×15): qty 20

## 2020-12-07 NOTE — Progress Notes (Signed)
Nutrition Follow-up  DOCUMENTATION CODES:  Not applicable  INTERVENTION:  Continue nocturnal TF via PEG: -Jevity 1.5 @ 2ml/hr (619ml/d) provided over 12 hours from 1800-0600 (6:00pm-6:00am) -13ml Prosource TF BID -Free water per MD, currently 226ml free water Q4H   Provides 1070 kcals, 64 grams protein, 501 ml free water (1701 ml total free water with flushes) Meets ~54% minimum estimated calorie needs and 64% minimum estimated protein needs  -Ensure Enlive po BID, each supplement provides 350 kcal and 20 grams of protein  NUTRITION DIAGNOSIS:  Inadequate oral intake related to dysphagia as evidenced by NPO status. -- ongoing  GOAL:  Patient will meet greater than or equal to 90% of their needs -- addressing with TF and supplements  MONITOR:  TF tolerance, Weight trends, Labs, I & O's  REASON FOR ASSESSMENT:  Consult Enteral/tube feeding initiation and management  ASSESSMENT:  Pt with a PMH significant for CHF, HTN, HLD, PE, recent R MCA/PCA watershed ischemic stroke admitted with evolving/increasing size of stroke, petechial hemorrhages, cytotoxic edema, concern for new seizures.  8/30 PEG placed  Pt continues to tolerate TF via PEG per RN. Currently receiving Jevity 1.5 @ 75ml/hr administered overnight for 12 hours, 31ml Prosource TF BID, 250ml free water Q4H. Pt c/o persistent nausea but denies emesis. NP aware and adjusting medication in hopes of improving intake. Pt's overall intake remains inadequate to meet nutritional needs -- 0-95% meal completion x 5 recorded meals since last RD assessment (33% average meal intake). Continue current nutrition plan of care. Hopeful intake will improve with addition of medications to improve nausea.   Medications: pepcid, Ensure Enlive/Plus BID, SSI, protonix, florastor Labs reviewed: BUN 33 (H), Cr 1.29 (H) CBGs: 109-142 x 24 hours  UOP: 1L x24 hours I/O: +9.8L since admit  Per RN edema assessment, pt with mild pitting edema to  BUE.   Admission weight 95.7 kg Current weight 85.2 kg  Diet Order:   Diet Order             DIET - DYS 1 Room service appropriate? Yes with Assist; Fluid consistency: Thin  Diet effective now                  EDUCATION NEEDS:  No education needs have been identified at this time  Skin:  Skin Assessment: Skin Integrity Issues: Skin Integrity Issues:: Other (Comment) Other: non-pressure wounds to base of penis and to back  Last BM:  10/3  Height:  Ht Readings from Last 1 Encounters:  10/14/20 5\' 5"  (1.651 m)   Weight:  Wt Readings from Last 1 Encounters:  12/07/20 85.2 kg   Ideal Body Weight:  61.82 kg  BMI:  Body mass index is 31.26 kg/m.  Estimated Nutritional Needs:  Kcal:  2000-2200 Protein:  100-120 grams Fluid:  >2L   Larkin Ina, MS, RD, LDN (she/her/hers) RD pager number and weekend/on-call pager number located in [place].

## 2020-12-07 NOTE — Plan of Care (Signed)
  Problem: Activity: Goal: Risk for activity intolerance will decrease Outcome: Progressing   Problem: Nutrition: Goal: Adequate nutrition will be maintained Outcome: Progressing   Problem: Coping: Goal: Level of anxiety will decrease Outcome: Progressing   Problem: Skin Integrity: Goal: Risk for impaired skin integrity will decrease Outcome: Progressing   

## 2020-12-07 NOTE — Progress Notes (Signed)
Occupational Therapy Treatment Patient Details Name: SIDDH VANDEVENTER MRN: [id number] DOB: 1950-07-12 Today's Date: 12/07/2020   History of present illness 70 yo male presented to ED on 8/11 for R sided headache and worsening L UE weakness. Patient recently admitted 8/3-8/5 for R CVA with L visual deficits and L sided weakness. MRI with mild enlargement of R MCA/border zone infarct since 10/07/20 MRI and increased cytotoxic edema and petechial hemorrhage without midline shift. Palliative Care consulted and in detailed family discussions, with prognosis provided by Neurology (more likely able to regain ability to talk and participate in self cares, unlikely to regain ability to walk, uncertain if able to regain ability to swallow, very unlikely to improve to full independence), it was decided to place PEG and pursue SNF rehab, PEG was placed 8/30. 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   OT comments  Myrtle is incrementally progressing. Session spent at bed level for bed mobility and LUE exercises. Pt continues to be limited by increased tone and edema in his LUE, with pain and grimacing with PROM of all LUE joints. Pt did show improvement with attending to L environmental stimuli, and sustaining attention at midline. Pt applied lotion to LUE with R hadn given verbal cues only. Retrograde massage given and re-position for L hand edema management, splint re-applied. Pt continues to benefit from OT acutely. D/c plan remains appropriate.    Recommendations for follow up therapy are one component of a multi-disciplinary discharge planning process, led by the attending physician.  Recommendations may be updated based on patient status, additional functional criteria and insurance authorization.    Follow Up Recommendations  SNF    Equipment Recommendations  None recommended by OT       Precautions / Restrictions Precautions Precautions: Fall Precaution Comments: L visual field deficit, R gaze  preference, L inattention, L hemparesis; G-tube and abdominal binder; peri skin breakdown Splint/Cast: soft elbow splint, prefab resting hand splint Other Brace: prevalon boots, L elbow splint and resting hand splint Restrictions Weight Bearing Restrictions: No       Mobility Bed Mobility Overal bed mobility: Needs Assistance Bed Mobility: Supine to Sit;Sit to Supine Rolling: Max assist         General bed mobility comments: rolling to reposition in bed    Transfers      Balance Overall balance assessment: Needs assistance             ADL either performed or assessed with clinical judgement   ADL Overall ADL's : Needs assistance/impaired Eating/Feeding: NPO Eating/Feeding Details (indicate cue type and reason): Gtube in place             General ADL Comments: Session spent at bed level, focused on attention to L side, LUE bathing, exercises, and proper positoning of splint     Vision   Vision Assessment?: Yes Visual Fields: Left homonymous hemianopsia Additional Comments: Pt attended to midline and crossing midline to attend to therapist and stimuli in L environment for brief moments          Cognition Arousal/Alertness: Awake/alert Behavior During Therapy: Flat affect Overall Cognitive Status: Impaired/Different from baseline           General Comments: pt oriented to self and place, he continues to talk about the "worms" in her head              General Comments VSS    Pertinent Vitals/ Pain       Pain Assessment: Faces  Faces Pain Scale: Hurts a little bit Pain Location: grimacing with ROM to neck and LUE Pain Descriptors / Indicators: Grimacing;Discomfort;Moaning   Frequency  Min 2X/week        Progress Toward Goals  OT Goals(current goals can now be found in the care plan section)  Progress towards OT goals: Progressing toward goals  Acute Rehab OT Goals Patient Stated Goal: None stated OT Goal Formulation: Patient unable  to participate in goal setting Time For Goal Achievement: 12/09/20 Potential to Achieve Goals: Fair ADL Goals Pt Will Perform Grooming: with mod assist;sitting;bed level Pt Will Perform Lower Body Bathing: with mod assist;bed level Pt Will Perform Lower Body Dressing: with mod assist;sitting/lateral leans Pt Will Transfer to Toilet: with mod assist;squat pivot transfer Pt Will Perform Toileting - Clothing Manipulation and hygiene: with mod assist;bed level Additional ADL Goal #1: Pt will locate three grooming items in the left visual field wtih mod cues Additional ADL Goal #2: Pt will demonstrate emergent awareness during ADLs with Min cues  Plan Discharge plan remains appropriate       AM-PAC OT "6 Clicks" Daily Activity     Outcome Measure   Help from another person eating meals?: Total Help from another person taking care of personal grooming?: A Lot Help from another person toileting, which includes using toliet, bedpan, or urinal?: Total Help from another person bathing (including washing, rinsing, drying)?: Total Help from another person to put on and taking off regular upper body clothing?: A Lot Help from another person to put on and taking off regular lower body clothing?: Total 6 Click Score: 8    End of Session Equipment Utilized During Treatment: Other (comment) (splints)  OT Visit Diagnosis: Unsteadiness on feet (R26.81);Muscle weakness (generalized) (M62.81);Other symptoms and signs involving cognitive function   Activity Tolerance Patient tolerated treatment well   Patient Left in bed;with call Waiters/phone within reach;with bed alarm set   Nurse Communication Mobility status        Time: 3810-1751 OT Time Calculation (min): 17 min  Charges: OT General Charges $OT Visit: 1 Visit OT Treatments $Therapeutic Exercise: 8-22 mins  Isra Lindy A Macintyre Alexa 12/07/2020, 2:40 PM

## 2020-12-07 NOTE — Progress Notes (Signed)
[facility] PROGRESS NOTE  Clifford [name] ERD:[id number] DOB: Apr 02, 1950 DOA: 10/14/2020 PCP: Pcp, No  Status: Remains inpatient appropriate because:Unsafe d/c plan  Dispo: The patient is from: Home              Anticipated d/c is to: SNF [facility] pending receipt of Medicaid number)              Patient currently is medically stable to d/c.   Difficult to place patient Yes              Barriers to discharge: Needs Medicaid number which is  pending-[facility]-unclear if they were made aware of current admission- does have Medicare Part A   Level of care: Med-Surg   Code Status: DNR Family Communication: No family at bedside DVT prophylaxis: Eliquis COVID vaccination status: Unknown  HPI: 70 y.o. 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  He was managed with supportive care-hospital course was complicated by worsening somnolence-further imaging studies showed cytotoxic edema with some mass-effect on the right lateral ventricle.  Neurology continued to follow closely-he was managed with continued supportive care.  He continued to exhibit significant left-sided hemiplegia/dysphagia-ultimately required G-tube placement.  Plans are for SNF on discharge.  Pradaxa prescribed prior to this hospitalization but was held due to petechial hemorrhage/severe dysphagia.  Subsequently been transitioned to eliquis.  Lung nodule 8 mm left upper lobe which is stable and can be followed up by PCP after discharge.  On 9/27 received information from his [place] [facility] that he has had acute hepatitis from Eliquis in the past.  Discussed with pharmacist and attending MD and since current LFTs  are within normal limits plan is to continue Eliquis and follow LFTs weekly.   Subjective: Continues to complain of nausea.  No emesis.  This is not associated with dizziness or vertigo symptoms.  Objective: Vitals:   12/06/20 2343 12/07/20 0344  BP: 116/78 (!) 100/58  Pulse: 65 62  Resp: 18 16  Temp: 98.4 F (36.9 C) 98.4 F (36.9 C)  SpO2: 100% 100%    Intake/Output Summary (Last 24 hours) at 12/07/2020 0820 Last data filed at 12/07/2020 0300 Gross per 24 hour  Intake --  Output 1000 ml  Net -1000 ml   Filed Weights   12/05/20 0416 12/06/20 0504 12/07/20 0358  Weight: 85 kg 85.1 kg 85.2 kg    Exam: Constitutional: NAD, calm, uncomfortable due to persistent nausea Respiratory: Lungs are clear , stable on room air, no increased work of breathing, pulse oximetry 100% Cardiovascular: S1-S2, pulse regular and skin is warm and dry Abdomen: Soft, nontender and non-distended, bowel sounds normoactive, LBM 10/02-PEG tube site unremarkable-oral intake remains variable-having constant nausea without vomiting Neurologic: CN 2-12 intact except for documented left visual field deficit with right gaze preference as well as left facial droop.  Continued left side neglect with associated hemiplegia.  RUE in splint.  Right foot with foot drop. Extremities: Offloading boots on both legs.  Soft splint on LUE.  Patient noted with edema involving left hand and wrist.  Hand warm to the touch. Psychiatric: Awake and oriented x 3   Stroke work-up: 8/3>> CT angio head: No intracranial large vessel occlusion 8/3>> CT angio neck: Minimal atherosclerotic change in  the bulb internal carotid artery bulbs.  No stenosis. 8/4>> A1c: 5.9 8/4>> LDL: 75 8/4>> Echo: EF 62-26%, grade 1 diastolic dysfunction. 8/11>> CT head: Expected evolution of Rt MCA/PCA territory infarct w petechial hemorrhage/mild edema. 8/11>> MRI brain: Right MCA infarct-increased cytotoxic edema/petechial hemorrhage without midline  shift. 8/16>> CT head: Evolving right posterior MCA territory infarct with increased cytotoxic edema.  Moderate mass-effect on the right lateral ventricle. 8/25>> MRI brain: Progressive swelling in the right hemisphere infarction-minimal petechial blood products.  Small acute infarctions of the inferior basal ganglia on the right.  8/12-10/2011>>LTM EEG: No seizures. 8/12:>>Spot EEG: No seizures    Assessment/Plan: Acute problems: Right MCA/PCA territory infarct with petechial hemorrhage/evolution-with mild edema/slight midline shift on repeat CT head on 8/16:  Sequelae of dense left hemiplegia and left-sided neglect-unable to ambulate at this time Left foot drop -continue splint /offloading boots as well as soft splint to LUE to minimize contracture Continue Skelaxin for spasticity-as of 10/3 therapy noted increased muscle tone in the left side so will increase dose of Skelaxin today 10/4 Therapies document slow/incremental progress  Focal left upper extremity edema Likely related to dependent issues from hemiplegia and left-sided neglect and has significantly improved  Persistent nausea Not associated with emesis, dizziness or vertigo symptoms Increase Protonix to twice daily Begin Pepcid twice daily Upon review of MAR no apparent offending medications that would cause consistent nausea   Possible focal seizure:  Continue Keppra/Topamax.   Headache:  Stable on Topamax.  Probably related to CVA/recent cytotoxic edema.   Fever on 9/10:  Resolved-cultures negative.   Completed 5-day course of cefepime.   HTN:  continue carvedilol/amlodipine/hydralazine prn   HLD:  Continue Lipitor   AKI on CKD stage IIIa:  AKI likely hemodynamically mediated-resolved-creatinine back to baseline of 3.3-3.5   Rate 1 diastolic dysfunction HFpEF:  Euvolemic on exam. 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   Dysphagia Nutrition Problem: Inadequate oral intake noting oral intake remains marginal therefore continuing nocturnal tube feedings Etiology: dysphagia Signs/Symptoms: SLP recommending D1 diet Interventions: Tube feeding, Prostat   Obesity: Estimated body mass index is 30.12 kg/m as calculated from the following:   Height as of this encounter: 5\' 5"  (1.651 m).   Weight as of this encounter: 82.1 kg.     Data Reviewed: Basic Metabolic Panel: Recent Labs  Lab 11/30/20 1621 12/01/20 0900 12/03/20 0344 12/06/20 0341  NA 134*  --  135 135  K 3.8  --  3.9 3.9  CL 105  --  105 105  CO2 21*  --  20* 21*  GLUCOSE 133*  --  124* 116*  BUN 29*  --  40* 33*  CREATININE 1.29*  --  1.44* 1.29*  CALCIUM 8.8*  --  8.4* 8.8*  MG  --  1.9  --  2.0  PHOS  --   --   --  3.8   Liver Function Tests: Recent Labs  Lab 11/30/20 1621 12/03/20 0344 12/06/20 0341  AST 33 35 37  ALT 33 35 37  ALKPHOS 83 67 72  BILITOT 0.6 0.6 0.5  PROT 6.4* 5.6* 6.3*  ALBUMIN 2.7* 2.3* 2.6*   CBC: Recent Labs  Lab 12/01/20 0900 12/03/20 0344 12/06/20 0341  WBC 9.6 7.9 6.2  HGB 11.1* 9.5* 9.9*  HCT 35.5* 30.6* 31.4*  MCV 82.4 82.5 82.0  PLT 190 151 176    CBG: Recent Labs  Lab 12/06/20 1139 12/06/20 1528 12/06/20 1950 12/07/20 0401 12/07/20 0812  GLUCAP 122* 124* 138* 142* 121*      Scheduled Meds:  apixaban  5 mg Per Tube BID   atorvastatin  40 mg Per Tube Daily   carvedilol  12.5 mg Per Tube BID WC   feeding supplement  237 mL Oral BID BM   feeding supplement (JEVITY 1.5 CAL/FIBER)  660 mL Per Tube Q24H   feeding supplement (PROSource TF)  45 mL Per Tube BID   free water  200 mL Per Tube Q4H   insulin aspart  0-9 Units Subcutaneous Q4H   levETIRAcetam  250 mg Per Tube BID   mouth rinse  15 mL Mouth  Rinse BID   metaxalone  400 mg Per Tube TID   modafinil  100 mg Per Tube Daily   pantoprazole sodium  40 mg Per Tube Daily   saccharomyces boulardii  250 mg Per Tube BID   topiramate  50 mg Per Tube BID     Principal Problem:   Left-sided weakness Active Problems:   Stroke ([place])   Essential hypertension   Mixed hyperlipidemia   Prediabetes   History of pulmonary embolism   Chronic anticoagulation   Nicotine dependence, cigarettes, uncomplicated   Petechial hemorrhage   Acute stroke due to ischemia [facility])   Advanced care planning/counseling discussion   Goals of care, counseling/discussion   Palliative care by specialist   Consultants: Neurology Interventional radiology  Procedures: Cortrack tube EEG PEG tube  Antibiotics: Cefepime 9/10 through 9/14 Vancomycin dose on 9/10   Time spent: 25 minutes    Erin Hearing ANP  [facility] 7 am - 330 pm/M-F for direct patient care and secure chat Please refer to Amion for contact info 54  days

## 2020-12-07 NOTE — Plan of Care (Signed)

## 2020-12-08 DIAGNOSIS — R531 Weakness: Secondary | ICD-10-CM | POA: Diagnosis not present

## 2020-12-08 LAB — GLUCOSE, CAPILLARY
Glucose-Capillary: 109 mg/dL — ABNORMAL HIGH (ref 70–99)
Glucose-Capillary: 114 mg/dL — ABNORMAL HIGH (ref 70–99)
Glucose-Capillary: 114 mg/dL — ABNORMAL HIGH (ref 70–99)
Glucose-Capillary: 117 mg/dL — ABNORMAL HIGH (ref 70–99)
Glucose-Capillary: 130 mg/dL — ABNORMAL HIGH (ref 70–99)
Glucose-Capillary: 131 mg/dL — ABNORMAL HIGH (ref 70–99)
Glucose-Capillary: 135 mg/dL — ABNORMAL HIGH (ref 70–99)

## 2020-12-08 MED ORDER — CYCLOBENZAPRINE HCL 10 MG PO TABS: 5.0000 mg | ORAL_TABLET | Freq: Every day | ORAL | Status: DC

## 2020-12-08 MED ORDER — CYCLOBENZAPRINE HCL 10 MG PO TABS
5.0000 mg | ORAL_TABLET | Freq: Every day | ORAL | Status: DC
  Administered 2020-12-08 – 2020-12-13 (×6): 5 mg
  Filled 2020-12-08 (×6): qty 1

## 2020-12-08 MED ORDER — QUETIAPINE FUMARATE 25 MG PO TABS
25.0000 mg | ORAL_TABLET | Freq: Every day | ORAL | Status: DC
  Administered 2020-12-08 – 2020-12-13 (×6): 25 mg
  Filled 2020-12-08 (×6): qty 1

## 2020-12-08 NOTE — Plan of Care (Signed)
  Problem: Clinical Measurements: Goal: Will remain free from infection Outcome: Progressing Goal: Diagnostic test results will improve Outcome: Progressing Goal: Respiratory complications will improve Outcome: Progressing Goal: Cardiovascular complication will be avoided Outcome: Progressing   Problem: Activity: Goal: Risk for activity intolerance will decrease Outcome: Progressing   

## 2020-12-08 NOTE — Plan of Care (Signed)

## 2020-12-08 NOTE — Progress Notes (Signed)
[facility] PROGRESS NOTE  Clifford [name] ZOX:[id number] DOB: 10-Jul-1950 DOA: 10/14/2020 PCP: Pcp, No  Status: Remains inpatient appropriate because:Unsafe d/c plan  Dispo: The patient is from: Home              Anticipated d/c is to: SNF [facility] pending receipt of Medicaid number)              Patient currently is medically stable to d/c.   Difficult to place patient Yes              Barriers to discharge: Needs Medicaid number which is  pending-[facility]-unclear if they were made aware of current admission- does have Medicare Part A   Level of care: Med-Surg   Code Status: DNR Family Communication: No family at bedside DVT prophylaxis: Eliquis COVID vaccination status: Unknown  HPI: 70 y.o. 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  He was managed with supportive care-hospital course was complicated by worsening somnolence-further imaging studies showed cytotoxic edema with some mass-effect on the right lateral ventricle.  Neurology continued to follow closely-he was managed with continued supportive care.  He continued to exhibit significant left-sided hemiplegia/dysphagia-ultimately required G-tube placement.  Plans are for SNF on discharge.  Pradaxa prescribed prior to this hospitalization but was held due to petechial hemorrhage/severe dysphagia.  Subsequently been transitioned to eliquis.  Lung nodule 8 mm left upper lobe which is stable and can be followed up by PCP after discharge.  On 9/27 received information from his [place] [facility] that he has had acute hepatitis from Eliquis in the past.  Discussed with pharmacist and attending MD and since current LFTs  are within normal limits plan is to continue Eliquis and follow LFTs weekly.   Subjective: Patient alert and very communicative today.  Reporting visual hallucinations with most recent being worms in the room.  He also describes a sensation of separating from his body.  His hallucinations have been present since his admission.  Discussed with patient that this is likely related to his stroke physiology and we can begin medication to help treat this.  Objective: Vitals:   12/07/20 2349 12/08/20 0339  BP: 134/75 (!) 143/63  Pulse: 99 60  Resp: 18 20  Temp: 98.7 F (37.1 C) 97.9 F (36.6 C)  SpO2: 100% 100%   No intake or output data in the 24 hours ending 12/08/20 0810  Filed Weights   12/06/20 0504 12/07/20 0358 12/08/20 0500  Weight: 85.1 kg 85.2 kg 85.3 kg    Exam: Constitutional: NAD, calm, reporting visual hallucinations Respiratory: Lungs remain clear on posterior exam, room air, pulse oximetry 98 to 100% Cardiac:: S1-S2, normotensive, regular pulse, peripheral edema left side in context of hemiplegia Abdomen:  LBM 10/05-PEG tube-soft nontender nondistended-inconsistent oral intake Neurologic: CN 2-12 intact except for documented left visual field deficit with right gaze preference as well as left facial droop.  Continued left side neglect with associated hemiplegia and spasticity.  RUE in splint.  Right foot with foot drop. Extremities: Offloading boots on both legs.  Soft splint on LUE.  Patient noted with edema involving left hand and wrist.  Hand warm to the touch. Psychiatric: Alert and oriented x3.  Has significant awareness of visual hallucinations and has described  a sensation of temporarily leaving his body multiple times during the hospitalization.   Stroke work-up: 8/3>> CT angio head: No intracranial large vessel occlusion 8/3>> CT angio neck: Minimal atherosclerotic change in the bulb internal carotid artery bulbs.  No stenosis. 8/4>> A1c: 5.9 8/4>> LDL: 75 8/4>>  Echo: EF 30-09%, grade 1 diastolic dysfunction. 8/11>> CT head: Expected evolution of Rt MCA/PCA territory infarct w petechial hemorrhage/mild edema. 8/11>> MRI brain: Right MCA infarct-increased cytotoxic edema/petechial hemorrhage without midline shift. 8/16>> CT head: Evolving right posterior MCA territory infarct with increased cytotoxic edema.  Moderate mass-effect on the right lateral ventricle. 8/25>> MRI brain: Progressive swelling in the right hemisphere infarction-minimal petechial blood products.  Small acute infarctions of the inferior basal ganglia on the right.  8/12-10/2011>>LTM EEG: No seizures. 8/12:>>Spot EEG: No seizures    Assessment/Plan: Acute problems: Right MCA/PCA territory infarct with petechial hemorrhage/evolution-with mild edema/slight midline shift on repeat CT head on 8/16:  Sequelae of dense left hemiplegia and left-sided neglect-unable to ambulate at this time Left foot drop -continue splint /offloading boots as well as soft splint to LUE to minimize contracture Therapy continues to report significant left side spasticity symptoms.  Skelaxin currently at max dose.  Have added Flexeril 5 mg HS as adjunctive therapy.  Given reports of hallucinations unable to transition to baclofen. Therapies document slow/incremental progress  Visual hallucinations Secondary to stroke physiology Begin Seroquel 25 mg HS  Focal left upper extremity edema Likely related to dependent issues from hemiplegia and left-sided neglect and has significantly improved  Persistent nausea Not associated with emesis, dizziness or vertigo symptoms Increase Protonix to twice daily Begin Pepcid twice daily Upon review of MAR no apparent offending medications that would cause consistent nausea   Possible focal seizure:  Continue Keppra/Topamax.   Headache:  Stable on Topamax.  Probably related to CVA/recent cytotoxic edema.   Fever on 9/10:  Resolved-cultures negative.   Completed  5-day course of cefepime.   HTN:  continue carvedilol/amlodipine/hydralazine prn   HLD:  Continue Lipitor   AKI on CKD stage IIIa:  AKI likely hemodynamically mediated-resolved-creatinine back to baseline of 2.3-3.0   Rate 1 diastolic dysfunction HFpEF:  Euvolemic on exam. 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   Dysphagia Nutrition Problem: Inadequate oral intake noting oral intake remains marginal therefore continuing nocturnal tube feedings Etiology: dysphagia Signs/Symptoms: SLP recommending D1 diet Interventions: Tube feeding, Prostat   Obesity: Estimated body mass index is 30.12 kg/m as calculated from the following:   Height as of this encounter: 5\' 5"  (1.651 m).   Weight as of this encounter: 82.1 kg.     Data Reviewed: Basic Metabolic Panel: Recent Labs  Lab 12/01/20 0900 12/03/20 0344 12/06/20 0341  NA  --  135 135  K  --  3.9 3.9  CL  --  105 105  CO2  --  20* 21*  GLUCOSE  --  124* 116*  BUN  --  40* 33*  CREATININE  --  1.44* 1.29*  CALCIUM  --  8.4* 8.8*  MG 1.9  --  2.0  PHOS  --   --  3.8   Liver Function Tests: Recent Labs  Lab 12/03/20 0344 12/06/20 0341  AST 35 37  ALT 35 37  ALKPHOS 67 72  BILITOT 0.6 0.5  PROT 5.6* 6.3*  ALBUMIN 2.3* 2.6*   CBC: Recent Labs  Lab 12/01/20 0900 12/03/20 0344 12/06/20 0341  WBC 9.6 7.9 6.2  HGB 11.1* 9.5* 9.9*  HCT 35.5* 30.6* 31.4*  MCV 82.4 82.5 82.0  PLT 190 151 176    CBG: Recent Labs  Lab 12/07/20 1203 12/07/20 1603 12/07/20 2008 12/07/20 2359 12/08/20 0344  GLUCAP 109* 110* 137* 135* 117*      Scheduled Meds:  apixaban  5 mg Per Tube BID   atorvastatin  40 mg Per Tube Daily   carvedilol  12.5 mg Per Tube  BID WC   cyclobenzaprine  5 mg Oral QHS   famotidine  20 mg Per Tube BID   feeding supplement  237 mL Oral BID BM   feeding supplement (JEVITY 1.5 CAL/FIBER)  660 mL Per Tube Q24H   feeding supplement (PROSource TF)  45 mL Per Tube BID   free water  200 mL Per Tube Q4H   insulin aspart  0-9 Units Subcutaneous Q4H   levETIRAcetam  250 mg Per Tube BID   mouth rinse  15 mL Mouth Rinse BID   metaxalone  800 mg Per Tube TID   modafinil  100 mg Per Tube Daily   pantoprazole sodium  40 mg Per Tube BID   saccharomyces boulardii  250 mg Per Tube BID   topiramate  50 mg Per Tube BID     Principal Problem:   Left-sided weakness Active Problems:   Stroke ([place])   Essential hypertension   Mixed hyperlipidemia   Prediabetes   History of pulmonary embolism   Chronic anticoagulation   Nicotine dependence, cigarettes, uncomplicated   Petechial hemorrhage   Acute stroke due to ischemia [facility])   Advanced care planning/counseling discussion   Goals of care, counseling/discussion   Palliative care by specialist   Consultants: Neurology Interventional radiology  Procedures: Cortrack tube EEG PEG tube  Antibiotics: Cefepime 9/10 through 9/14 Vancomycin dose on 9/10   Time spent: 25 minutes    Erin Hearing ANP  [facility] 7 am - 330 pm/M-F for direct patient care and secure chat Please refer to Amion for contact info 55  days

## 2020-12-08 NOTE — Progress Notes (Signed)
CSW spoke with Juliann Pulse at [facility] who states she cannot accept the patient as he will require long term care.  Madilyn Fireman, MSW, LCSW Transitions of Care  Clinical Social Worker II [phone]

## 2020-12-09 ENCOUNTER — Inpatient Hospital Stay (HOSPITAL_COMMUNITY): Payer: No Typology Code available for payment source

## 2020-12-09 DIAGNOSIS — R531 Weakness: Secondary | ICD-10-CM | POA: Diagnosis not present

## 2020-12-09 LAB — GLUCOSE, CAPILLARY
Glucose-Capillary: 111 mg/dL — ABNORMAL HIGH (ref 70–99)
Glucose-Capillary: 113 mg/dL — ABNORMAL HIGH (ref 70–99)
Glucose-Capillary: 118 mg/dL — ABNORMAL HIGH (ref 70–99)
Glucose-Capillary: 127 mg/dL — ABNORMAL HIGH (ref 70–99)
Glucose-Capillary: 131 mg/dL — ABNORMAL HIGH (ref 70–99)
Glucose-Capillary: 136 mg/dL — ABNORMAL HIGH (ref 70–99)

## 2020-12-09 NOTE — Progress Notes (Signed)
[facility] PROGRESS NOTE  Clifford [name] JWJ:[id number] DOB: 08-08-1950 DOA: 10/14/2020 PCP: Pcp, No  Status: Remains inpatient appropriate because:Unsafe d/c plan  Dispo: The patient is from: Home              Anticipated d/c is to: SNF [facility] pending receipt of Medicaid number)              Patient currently is medically stable to d/c.   Difficult to place patient Yes              Barriers to discharge: Needs Medicaid number which is  pending-[facility]clear if they were made aware of current admission- does have Medicare Part A   Level of care: Med-Surg   Code Status: DNR Family Communication: No family at bedside DVT prophylaxis: Eliquis COVID vaccination status: Unknown  HPI: 70 y.o. 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  He was managed with supportive care-hospital course was complicated by worsening somnolence-further imaging studies showed cytotoxic edema with some mass-effect on the right lateral ventricle.  Neurology continued to follow closely-he was managed with continued supportive care.  He continued to exhibit significant left-sided hemiplegia/dysphagia-ultimately required G-tube placement.  Plans are for SNF on discharge.  Pradaxa prescribed prior to this hospitalization but was held due to petechial hemorrhage/severe dysphagia.  Subsequently been transitioned to eliquis.  Lung nodule 8 mm left upper lobe which is stable and can be followed up by PCP after discharge.  On 9/27 received information from his [facility] that he has had acute hepatitis from Eliquis in the past.  Discussed with pharmacist and attending MD and since current LFTs  are within normal limits plan is to continue Eliquis and follow LFTs weekly.   Subjective: Awake.  Continues to report no further nausea.  States he is only having visual hallucinations at night and not during the day.  Objective: Vitals:   12/09/20 0330 12/09/20 0738  BP: (!) 142/75 (!) 163/66  Pulse: 72 73  Resp: 16 18  Temp: 98.7 F (37.1 C) (!) 97.5 F (36.4 C)  SpO2: 100% 100%    Intake/Output Summary (Last 24 hours) at 12/09/2020 6213 Last data filed at 12/08/2020 1523 Gross per 24 hour  Intake --  Output 400 ml  Net -400 ml    Filed Weights   12/07/20 0358 12/08/20 0500 12/09/20 0500  Weight: 85.2 kg 85.3 kg 85 kg    Exam: Constitutional: NAD, calm, reporting visual hallucinations at bedtime Respiratory: Lungs are clear, normal respiratory effort, room air Cardiac:: S1-S2, regular pulse, no rubs murmurs or gallops, peripheral edema left side in context of hemiplegia Abdomen:  LBM 10/05-PEG tube-soft nontender nondistended-inconsistent oral intake Neurologic: CN 2-12 intact except for documented left visual field deficit with right gaze preference as well as left facial droop.  Continued left side neglect with associated hemiplegia and spasticity.  RUE in splint.  Right foot with foot drop. Extremities: Offloading boots on both legs.  Soft splint on LUE.  Patient noted with edema involving left hand and wrist.  Hand warm to the touch. Psychiatric: Alert and oriented x3, continues to report nocturnal  hallucinations    Stroke work-up: 8/3>> CT angio head: No intracranial large vessel occlusion 8/3>> CT angio neck:  Minimal atherosclerotic change in the bulb internal carotid artery bulbs.  No stenosis. 8/4>> A1c: 5.9 8/4>> LDL: 75 8/4>> Echo: EF 41-93%, grade 1 diastolic dysfunction. 8/11>> CT head: Expected evolution of Rt MCA/PCA territory infarct w petechial hemorrhage/mild edema. 8/11>> MRI brain: Right MCA infarct-increased cytotoxic edema/petechial hemorrhage  without midline shift. 8/16>> CT head: Evolving right posterior MCA territory infarct with increased cytotoxic edema.  Moderate mass-effect on the right lateral ventricle. 8/25>> MRI brain: Progressive swelling in the right hemisphere infarction-minimal petechial blood products.  Small acute infarctions of the inferior basal ganglia on the right.  8/12-10/2011>>LTM EEG: No seizures. 8/12:>>Spot EEG: No seizures    Assessment/Plan: Acute problems: Right MCA/PCA territory infarct with petechial hemorrhage/evolution-with mild edema/slight midline shift on repeat CT head on 8/16:  Sequelae of dense left hemiplegia and left-sided neglect-unable to ambulate at this time Left foot drop -continue splint /offloading boots as well as soft splint to LUE to minimize contracture Therapy continues to report significant left side spasticity symptoms.  Skelaxin currently at max dose.  Have added Flexeril 5 mg HS as adjunctive therapy.  Given reports of hallucinations unable to transition to baclofen. Therapies document slow/incremental progress  Visual hallucinations Secondary to stroke physiology Continue Seroquel 25 mg HS-consider increasing dose  if visual hallucinations continue  Focal left upper extremity edema Likely related to dependent issues from hemiplegia and left-sided neglect and has significantly improved  Persistent nausea Resolved Continue Protonix and Pepcid   Possible focal seizure:  Continue Keppra/Topamax.   Headache:  Stable on Topamax.  Probably related to CVA/recent cytotoxic edema.   Fever on 9/10:  Resolved-cultures negative.   Completed 5-day course of cefepime.   HTN:  continue carvedilol/amlodipine/hydralazine prn   HLD:  Continue Lipitor   AKI on CKD stage IIIa:  AKI likely hemodynamically mediated-resolved-creatinine back to baseline of 7.9-0.2   Rate 1 diastolic dysfunction HFpEF:  Euvolemic on exam. 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   Dysphagia Nutrition Problem: Inadequate oral intake noting oral intake remains marginal therefore continuing nocturnal tube feedings Etiology: dysphagia Signs/Symptoms: SLP recommending D1 diet Interventions: Tube feeding, Prostat   Obesity: Estimated body mass index is 30.12 kg/m as calculated from the following:   Height as of this encounter: 5\' 5"  (1.651 m).   Weight as of this encounter: 82.1 kg.     Data Reviewed: Basic Metabolic Panel: Recent Labs  Lab 12/03/20 0344 12/06/20 0341  NA 135 135  K 3.9 3.9  CL 105 105  CO2 20* 21*  GLUCOSE 124* 116*  BUN 40* 33*  CREATININE 1.44* 1.29*  CALCIUM 8.4* 8.8*  MG  --  2.0  PHOS  --  3.8   Liver Function Tests: Recent Labs  Lab 12/03/20 0344 12/06/20 0341  AST 35 37  ALT 35 37  ALKPHOS 67 72  BILITOT 0.6 0.5  PROT 5.6* 6.3*  ALBUMIN 2.3* 2.6*   CBC: Recent Labs  Lab 12/03/20 0344 12/06/20 0341  WBC 7.9 6.2  HGB 9.5* 9.9*  HCT 30.6* 31.4*  MCV 82.5 82.0  PLT 151 176    CBG: Recent Labs  Lab 12/08/20 1545 12/08/20 1946 12/08/20 2324 12/09/20 0446 12/09/20 0737  GLUCAP 131* 114* 130* 131* 136*      Scheduled  Meds:  apixaban  5 mg Per Tube BID   atorvastatin  40 mg Per Tube Daily   carvedilol  12.5 mg Per Tube BID WC   cyclobenzaprine  5 mg Per Tube QHS   famotidine  20 mg Per Tube BID   feeding supplement  237 mL Oral BID BM   feeding supplement (JEVITY 1.5 CAL/FIBER)  660 mL Per Tube Q24H   feeding supplement (PROSource TF)  45 mL Per Tube BID   free water  200 mL Per Tube Q4H   insulin aspart  0-9 Units Subcutaneous Q4H   levETIRAcetam  250 mg Per Tube BID   mouth rinse  15 mL Mouth Rinse BID   metaxalone  800 mg Per Tube TID   modafinil   100 mg Per Tube Daily   pantoprazole sodium  40 mg Per Tube BID   QUEtiapine  25 mg Per Tube QHS   saccharomyces boulardii  250 mg Per Tube BID   topiramate  50 mg Per Tube BID     Principal Problem:   Left-sided weakness Active Problems:   Stroke ([place])   Essential hypertension   Mixed hyperlipidemia   Prediabetes   History of pulmonary embolism   Chronic anticoagulation   Nicotine dependence, cigarettes, uncomplicated   Petechial hemorrhage   Acute stroke due to ischemia [facility])   Advanced care planning/counseling discussion   Goals of care, counseling/discussion   Palliative care by specialist   Consultants: Neurology Interventional radiology  Procedures: Cortrack tube EEG PEG tube  Antibiotics: Cefepime 9/10 through 9/14 Vancomycin dose on 9/10   Time spent: 25 minutes    Erin Hearing ANP  [facility] 7 am - 330 pm/M-F for direct patient care and secure chat Please refer to Amion for contact info 56  days

## 2020-12-09 NOTE — Progress Notes (Signed)
Physical Therapy Treatment Patient Details Name: Clifford [name] MRN: [id number] DOB: 09/14/1950 Today's Date: 12/09/2020   History of Present Illness 70 yo male presented to ED on 8/11 for R sided headache and worsening L UE weakness. Patient recently admitted 8/3-8/5 for R CVA with L visual deficits and L sided weakness. MRI with mild enlargement of R MCA/border zone infarct since 10/07/20 MRI and increased cytotoxic edema and petechial hemorrhage without midline shift. Palliative Care consulted and in detailed family discussions, with prognosis provided by Neurology (more likely able to regain ability to talk and participate in self cares, unlikely to regain ability to walk, uncertain if able to regain ability to swallow, very unlikely to improve to full independence), it was decided to place PEG and pursue SNF rehab, PEG was placed 8/30. 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    PT Comments    Pt received in supine, alert and oriented to self/somewhat to situation but not to location and participatory as able. Emphasis on repositioning for pressure relief/to promote neutral hip and LUE alignment, RUE/RLE exercises for strengthening and benefits of mobility. Pt able to assist somewhat with posterior supine scooting toward [facility] but having difficulty sequencing with RUE to pull up into long sit in bed. Pt would benefit from hoyer lift to chair during the day with nursing staff assist, RN notified. Pt continues to benefit from PT services to progress toward functional mobility goals.    Recommendations for follow up therapy are one component of a multi-disciplinary discharge planning process, led by the attending physician.  Recommendations may be updated based on patient status, additional functional criteria and insurance authorization.  Follow Up Recommendations  SNF;Supervision/Assistance - 24 hour     Equipment Recommendations  Hospital bed;3in1 (PT);Wheelchair cushion (measurements  PT);Wheelchair (measurements PT);Other (comment) (mechanical lift and pads)    Recommendations for Other Services       Precautions / Restrictions Precautions Precautions: Fall Precaution Comments: L visual field deficit, R gaze preference, L inattention, L hemparesis; G-tube and abdominal binder; peri skin breakdown Splint/Cast: soft elbow splint, prefab resting hand splint Other Brace: prevalon boots, L elbow splint and resting hand splint Restrictions Weight Bearing Restrictions: No     Mobility  Bed Mobility Overal bed mobility: Needs Assistance   Rolling: Max assist   Supine to sit: Total assist     General bed mobility comments: rolling to reposition in bed and +2 maxA for posterior supine scooting toward HOB with pt pulling up on overhead rail with RUE; pt attempting to long sit with R rail but continues to need totalA for trunk support and unable to reach full upright posture in bed with +1 assist.      Modified Rankin (Stroke Patients Only) Modified Rankin (Stroke Patients Only) Pre-Morbid Rankin Score: Moderately severe disability Modified Rankin: Severe disability     Balance Overall balance assessment: Needs assistance   Sitting balance-Leahy Scale: Zero Sitting balance - Comments: totalA when attempting long sit but unable to maintain unsupported sitting with +1            Cognition Arousal/Alertness: Awake/alert Behavior During Therapy: Flat affect Overall Cognitive Status: Impaired/Different from baseline                         General Comments: pt oriented to self but unable to state "hospital" or type of location this date. Pt reoriented to place, pt attending slightly better to midline this date but  difficulty gazing past midline on L      Exercises General Exercises - Lower Extremity Ankle Circles/Pumps: PROM;AROM;Both;10 reps;Supine (PROM on LLE, AROM on RLE) Short Arc Quad: PROM;Left;10 reps;Supine Long Arc Quad:  Right;Seated;20 reps;AROM (bed in chair position) Heel Slides: AAROM;Right;10 reps;Supine Hip ABduction/ADduction: AAROM;Right;10 reps;Supine Other Exercises Other Exercises: worked on core/RUE strengthening via pulling forward to long sit in bed (HOB started at 60 deg, pt only able to lean forward a few degrees and unable to pull fully upright) x10 reps Other Exercises: RUE pushing/pulling rowing with light resistance to tolerance Other Exercises: RUE AAROM shoulder flexion and AROM shoulder extension wtih slow/controlled movement Other Exercises: RUE wrist flex/ext and gross grasp x10 reps    General Comments General comments (skin integrity, edema, etc.): pt noted to have L knee in flexed position in supine upon PTA arrival to room, per RN this was done by pt, pt without volitional movement during LLE ROM and likely due to hypertonicity. Will continue to assess.      Pertinent Vitals/Pain Pain Assessment: Faces Faces Pain Scale: Hurts even more Pain Location: with LLE PROM knee extension and L hip IR Pain Descriptors / Indicators: Grimacing;Discomfort;Moaning Pain Intervention(s): Limited activity within patient's tolerance;Monitored during session;Repositioned (RN notified of pt complaint)     PT Goals (current goals can now be found in the care plan section) Acute Rehab PT Goals Patient Stated Goal: None stated PT Goal Formulation: With patient Time For Goal Achievement: 12/20/20 Progress towards PT goals: Progressing toward goals    Frequency    Min 2X/week      PT Plan Current plan remains appropriate       AM-PAC PT "6 Clicks" Mobility   Outcome Measure  Help needed turning from your back to your side while in a flat bed without using bedrails?: Total Help needed moving from lying on your back to sitting on the side of a flat bed without using bedrails?: Total Help needed moving to and from a bed to a chair (including a wheelchair)?: Total Help needed standing up  from a chair using your arms (e.g., wheelchair or bedside chair)?: Total Help needed to walk in hospital room?: Total Help needed climbing 3-5 steps with a railing? : Total 6 Click Score: 6    End of Session   Activity Tolerance: Patient tolerated treatment well Patient left: in bed;with call Buttler/phone within reach;with bed alarm set Nurse Communication: Mobility status;Other (comment) (pt c/o L knee discomfort, may need imaging?) PT Visit Diagnosis: Muscle weakness (generalized) (M62.81);Other abnormalities of gait and mobility (R26.89);Hemiplegia and hemiparesis Hemiplegia - Right/Left: Left Hemiplegia - dominant/non-dominant: Non-dominant Hemiplegia - caused by: Unspecified (enlargement of R MCA)     Time: 4081-4481 PT Time Calculation (min) (ACUTE ONLY): 33 min  Charges:  $Therapeutic Exercise: 8-22 mins $Therapeutic Activity: 8-22 mins                     Juriel Cid P., PTA Acute Rehabilitation Services Pager: [phone] Office: [place] 12/09/2020, 5:37 PM

## 2020-12-09 NOTE — Progress Notes (Signed)
CSW spoke with Lexine Baton at [facility] to request a review of this patient.  CSW attempted to reach [place] at [place] were left requesting a return call.  CSW attempted to reach patient's brother Marland Kitchen without success - a voicemail was left requesting a return call.  CSW spoke with Shirlee Limerick at [facility] who states she will review patient's clinicals.  Madilyn Fireman, MSW, LCSW Transitions of Care  Clinical Social Worker II [phone]

## 2020-12-09 NOTE — Plan of Care (Signed)
  Problem: Nutrition: Goal: Risk of aspiration will decrease Outcome: Progressing Goal: Dietary intake will improve Outcome: Progressing   Problem: Ischemic Stroke/TIA Tissue Perfusion: Goal: Complications of ischemic stroke/TIA will be minimized Outcome: Progressing

## 2020-12-09 NOTE — Progress Notes (Signed)
Speech Language Pathology Treatment: Dysphagia;Cognitive-Linquistic  Patient Details Name: Clifford [name] MRN: [id number] DOB: 06/16/1950 Today's Date: 12/09/2020 Time: 0737-1062 SLP Time Calculation (min) (ACUTE ONLY): 22 min  Assessment / Plan / Recommendation Clinical Impression  Pt was seen for therapy targeting swallowing and cognition goals. This date, pt orientation and alertness were markedly improved since last targeted session. Pt able to describe situation and next steps as well as discuss events that happened earlier in the day. Speech intelligibility also markedly improved since last targeted session ~80% at the sentence level. Average utterance length also improved. Pt intellectual awareness impaired, pt stated he was wearing dentures and had adequate vision although no dentures present and pt exhibited signs of vision impairment during tasks. SLP provided verbal cues to enhance awareness regarding dentures with no improvement. Pt reports no problems with current diet (Dys1/thin liquid). SLP trialed regular solids; pt exhibited prolonged mastication and prolonged bolus formation which was aided by addition of thin liquid via straw. No pocketing or oral holding present this date. Pt exhibited no overt s/s of penetration or aspiration. SLP recommend upgrade to Dys2/thin liquid diet secondary to marked improvement in cognition and oral phase improvements. SLP will continue to follow for management of diet toleration, awareness, and speech intelligibility.       HPI HPI: 70yo male admitted 10/14/20 with right side headache and LUE weakness. 10/19/20 pt was noted to have increased LUE weakness. 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 CTHead = evolving RMCA infarct with cytotoxic edema, mass effect      SLP Plan  Continue with current plan of care      Recommendations for follow up therapy are one component of a  multi-disciplinary discharge planning process, led by the attending physician.  Recommendations may be updated based on patient status, additional functional criteria and insurance authorization.    Recommendations  Diet recommendations: Dysphagia 2 (fine chop);Thin liquid Liquids provided via: Straw Medication Administration: Via alternative means Supervision: Full supervision/cueing for compensatory strategies;Staff to assist with self feeding Compensations: Minimize environmental distractions;Slow rate;Small sips/bites;Monitor for anterior loss;Lingual sweep for clearance of pocketing Postural Changes and/or Swallow Maneuvers: Seated upright 90 degrees;Upright 30-60 min after meal                Oral Care Recommendations: Oral care BID Follow up Recommendations: Skilled Nursing facility SLP Visit Diagnosis: Cognitive communication deficit (R41.841);Dysarthria and anarthria (R47.1);Dysphagia, oropharyngeal phase (R13.12) Plan: Continue with current plan of care       [place], SLP-Student   Dewitt Rota  12/09/2020, 3:54 PM

## 2020-12-10 DIAGNOSIS — R531 Weakness: Secondary | ICD-10-CM | POA: Diagnosis not present

## 2020-12-10 LAB — GLUCOSE, CAPILLARY
Glucose-Capillary: 135 mg/dL — ABNORMAL HIGH (ref 70–99)
Glucose-Capillary: 87 mg/dL (ref 70–99)
Glucose-Capillary: 116 mg/dL — ABNORMAL HIGH (ref 70–99)
Glucose-Capillary: 98 mg/dL (ref 70–99)
Glucose-Capillary: 121 mg/dL — ABNORMAL HIGH (ref 70–99)

## 2020-12-10 MED ORDER — ACETAMINOPHEN 160 MG/5ML PO SOLN
650.0000 mg | Freq: Four times a day (QID) | ORAL | Status: DC
  Administered 2020-12-10 – 2020-12-14 (×17): 650 mg
  Filled 2020-12-10 (×17): qty 20.3

## 2020-12-10 NOTE — Progress Notes (Addendum)
1pm: CSW received secure e-mail from [employer] of financial counseling who states patient's Medicaid has been approved - #[id number] O.   9am: CSW spoke with patient's brother Marland Kitchen to discuss updates regarding barriers to placement - CSW will provided [place] with updated information as it becomes available.  Madilyn Fireman, MSW, LCSW Transitions of Care  Clinical Social Worker II [phone]

## 2020-12-10 NOTE — Progress Notes (Signed)
Speech Language Pathology Treatment: Dysphagia;Cognitive-Linquistic  Patient Details Name: Clifford [name] MRN: [id number] DOB: [birth year] Today's Date: 12/10/2020 Time: 0737-1062 SLP Time Calculation (min) (ACUTE ONLY): 33 min  Assessment / Plan / Recommendation Clinical Impression  Intervention for dysphagia and cognition during lunch meal focusing on strategies for compensation, attention, initiation, timely processing and response time. Mod-max multimodal assist needed to initiate feeding, clear majority of left pocketed food prior to subsequent bites and attend to task all improved significantly compared to last week. He was taught and able to return demonstrate removal of food residue using toothette sponge. There were no indications of laryngeal intrusion noted. Pt requires full supervision and assist for feeding and safety   HPI HPI: 70yo male admitted 10/14/20 with right side headache and LUE weakness. 10/19/20 pt was noted to have increased LUE weakness. 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  Recommendations may be updated based on patient status, additional functional criteria and insurance authorization.    Recommendations  Diet recommendations: Dysphagia 2 (fine chop);Thin liquid Liquids provided via: Cup;Straw Medication Administration: Crushed with puree Supervision: Staff to assist with self feeding;Full supervision/cueing for compensatory strategies Compensations: Minimize environmental distractions;Slow rate;Lingual sweep for clearance of pocketing;Small sips/bites Postural Changes and/or Swallow Maneuvers: Seated upright 90 degrees                 Oral Care Recommendations: Oral care BID Follow up Recommendations: Skilled Nursing facility SLP Visit Diagnosis: Dysphagia, unspecified (R13.10);Cognitive communication deficit (I94.854) Plan: Continue with current plan of care       GO                Houston Siren  12/10/2020, 1:14 PM

## 2020-12-10 NOTE — Progress Notes (Addendum)
[facility] PROGRESS NOTE  Clifford [name] YYT:[id number] DOB: [birth year] DOA: 10/14/2020 PCP: Pcp, No  Status: Remains inpatient appropriate because:Unsafe d/c plan  Dispo: The patient is from: Home              Anticipated d/c is to: Home vs SNF              Patient currently is medically stable to d/c.   Difficult to place patient Yes              Barriers to discharge: Needs Medicaid number which is  pending-[facility]-unclear if they were made aware of current admission- does have Medicare Part A   Level of care: Med-Surg   Code Status: DNR Family Communication: No family at bedside DVT prophylaxis: Eliquis COVID vaccination status: Unknown  HPI: 70 y.o. 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  He was managed with supportive care-hospital course was complicated by worsening somnolence-further imaging studies showed cytotoxic edema with some mass-effect on the right lateral ventricle.  Neurology continued to follow closely-he was managed with continued supportive care.  He continued to exhibit significant left-sided hemiplegia/dysphagia-ultimately required G-tube placement.  Plans are for SNF on discharge.  Pradaxa prescribed prior to this hospitalization but was held due to petechial hemorrhage/severe dysphagia.  Subsequently been transitioned to eliquis.  Lung nodule 8 mm left upper lobe which is stable and can be followed up by PCP after discharge.  On 9/27 received information from his [place] [facility] that he has had acute hepatitis from Eliquis in the past.  Discussed with pharmacist and attending MD and since current LFTs are within normal limits plan is to continue Eliquis and  follow LFTs weekly.   Subjective: Awake.  When asked if he is still having hallucinations at night he stated no.  Knee x-ray results explained to patient and he verbalized understanding.  Objective: Vitals:   12/10/20 0338 12/10/20 0722  BP: 134/67 (!) 151/75  Pulse: 67 (!) 58  Resp: 18 18  Temp: 98 F (36.7 C) 97.7 F (36.5 C)  SpO2: 100% 100%    Intake/Output Summary (Last 24 hours) at 12/10/2020 0800 Last data filed at 12/10/2020 0630 Gross per 24 hour  Intake 120 ml  Output 775 ml  Net -655 ml    Filed Weights   12/07/20 0358 12/08/20 0500 12/09/20 0500  Weight: 85.2 kg 85.3 kg 85 kg    Exam: Constitutional: NAD, calm, reporting visual hallucinations at bedtime Respiratory: Lung sounds remain clear to auscultation on anterior exam, no increased work of breathing, no coughing, pulse oximetry 100% Cardiac:: S1-S2, regular pulse, no lower extremity edema, skin warm and dry Abdomen:  LBM 10/06-PEG tube-soft nontender nondistended-inconsistent oral intake Neurologic: CN 2-12 intact except for documented left visual field deficit with right gaze preference as well as left facial droop.  Continued left side neglect with associated hemiplegia and spasticity.  RUE in splint.  Right foot with foot drop. Extremities: Offloading boots on both legs.  Soft splint on LUE.  Patient noted with edema involving left hand and wrist.  Hand warm to the touch. Psychiatric: Alert and oriented x3 --reports no longer having nocturnal visual hallucinations  Stroke work-up: 8/3>> CT angio head: No intracranial large vessel occlusion 8/3>> CT angio  neck: Minimal atherosclerotic change in the bulb internal carotid artery bulbs.  No stenosis. 8/4>> A1c: 5.9 8/4>> LDL: 75 8/4>> Echo: EF 16-10%, grade 1 diastolic dysfunction. 8/11>> CT head: Expected evolution of Rt MCA/PCA territory infarct w petechial hemorrhage/mild edema. 8/11>> MRI brain: Right MCA infarct-increased cytotoxic edema/petechial  hemorrhage without midline shift. 8/16>> CT head: Evolving right posterior MCA territory infarct with increased cytotoxic edema.  Moderate mass-effect on the right lateral ventricle. 8/25>> MRI brain: Progressive swelling in the right hemisphere infarction-minimal petechial blood products.  Small acute infarctions of the inferior basal ganglia on the right.  8/12-10/2011>>LTM EEG: No seizures. 8/12:>>Spot EEG: No seizures    Assessment/Plan: Acute problems: Right MCA/PCA territory infarct with petechial hemorrhage/evolution-with mild edema/slight midline shift on repeat CT head on 8/16:  Sequelae of dense left hemiplegia and left-sided neglect-unable to ambulate at this time-continue offloading boots and LUE splint Continue Skelaxin along with HS Flexeril for spasticity Therapies document slow/incremental progress  Visual hallucinations Secondary to stroke physiology Currently not experiencing hallucinations as of 10/07 Continue Seroquel 25 mg HS  Left knee pain X-ray reveals mild degenerative changes consistent with osteoarthritis Possibly worsened by ongoing spasticity post stroke noting this is his affected side Begin scheduled Tylenol-LFTs normal on 10/2  Persistent nausea Resolved Continue Protonix and Pepcid   Possible focal seizure:  Continue Keppra/Topamax.   Headache:  Stable on Topamax.  Probably related to CVA/recent cytotoxic edema.   Fever on 9/10:  Resolved-cultures negative.   Completed 5-day course of cefepime.   HTN:  continue carvedilol/amlodipine/hydralazine prn   HLD:  Continue Lipitor   AKI on CKD stage IIIa:  AKI likely hemodynamically mediated-resolved-creatinine back to baseline of 9.6-0.4   Rate 1 diastolic dysfunction HFpEF:  Euvolemic on exam. 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   Dysphagia Nutrition Problem: Inadequate oral intake noting oral intake remains marginal therefore continuing nocturnal tube feedings Etiology: dysphagia Signs/Symptoms: SLP recommending D1 diet Interventions: Tube feeding, Prostat   Obesity: Estimated body mass index is 30.12 kg/m as calculated from the following:   Height as of this encounter: 5\' 5"  (1.651 m).   Weight as of this encounter: 82.1 kg.     Data Reviewed: Basic Metabolic Panel: Recent Labs  Lab 12/06/20 0341  NA 135  K 3.9  CL 105  CO2 21*  GLUCOSE 116*  BUN 33*  CREATININE 1.29*  CALCIUM 8.8*  MG 2.0  PHOS 3.8   Liver Function Tests: Recent Labs  Lab 12/06/20 0341  AST 37  ALT 37  ALKPHOS 72  BILITOT 0.5  PROT 6.3*  ALBUMIN 2.6*   CBC: Recent Labs  Lab 12/06/20 0341  WBC 6.2  HGB 9.9*  HCT 31.4*  MCV 82.0  PLT 176    CBG: Recent Labs  Lab 12/09/20 1627 12/09/20 2007 12/09/20 2330 12/10/20 0337 12/10/20 0721  GLUCAP 118* 113* 127* 135* 87      Scheduled Meds:  apixaban  5 mg Per Tube BID   atorvastatin  40 mg Per Tube Daily   carvedilol  12.5 mg Per Tube BID WC   cyclobenzaprine  5 mg Per Tube QHS   famotidine  20 mg Per Tube BID   feeding supplement  237 mL Oral BID BM  feeding supplement (JEVITY 1.5 CAL/FIBER)  660 mL Per Tube Q24H   feeding supplement (PROSource TF)  45 mL Per Tube BID   free water  200 mL Per Tube Q4H   insulin aspart  0-9 Units Subcutaneous Q4H   levETIRAcetam  250 mg Per Tube BID   mouth rinse  15 mL Mouth Rinse BID   metaxalone  800 mg Per Tube TID   modafinil  100 mg Per Tube Daily   pantoprazole sodium  40 mg Per Tube BID   QUEtiapine  25 mg Per Tube QHS   saccharomyces boulardii  250 mg Per Tube BID   topiramate  50 mg Per Tube BID     Principal Problem:   Left-sided weakness Active Problems:   Stroke ([place])    Essential hypertension   Mixed hyperlipidemia   Prediabetes   History of pulmonary embolism   Chronic anticoagulation   Nicotine dependence, cigarettes, uncomplicated   Petechial hemorrhage   Acute stroke due to ischemia [facility])   Advanced care planning/counseling discussion   Goals of care, counseling/discussion   Palliative care by specialist   Consultants: Neurology Interventional radiology  Procedures: Cortrack tube EEG PEG tube  Antibiotics: Cefepime 9/10 through 9/14 Vancomycin dose on 9/10   Time spent: 25 minutes    Erin Hearing ANP  [facility] 7 am - 330 pm/M-F for direct patient care and secure chat Please refer to Amion for contact info 57  days

## 2020-12-10 NOTE — Plan of Care (Signed)
  Problem: Health Behavior/Discharge Planning: Goal: Ability to manage health-related needs will improve Outcome: Progressing   Problem: Clinical Measurements: Goal: Ability to maintain clinical measurements within normal limits will improve Outcome: Progressing Goal: Will remain free from infection Outcome: Progressing Goal: Respiratory complications will improve Outcome: Progressing   Problem: Activity: Goal: Risk for activity intolerance will decrease Outcome: Progressing   Problem: Nutrition: Goal: Adequate nutrition will be maintained Outcome: Progressing   

## 2020-12-11 DIAGNOSIS — R531 Weakness: Secondary | ICD-10-CM | POA: Diagnosis not present

## 2020-12-11 LAB — CBC WITH DIFFERENTIAL/PLATELET
Abs Immature Granulocytes: 0.09 10*3/uL — ABNORMAL HIGH (ref 0.00–0.07)
Basophils Absolute: 0 10*3/uL (ref 0.0–0.1)
Basophils Relative: 1 %
Eosinophils Absolute: 0.1 10*3/uL (ref 0.0–0.5)
Eosinophils Relative: 2 %
HCT: 32.4 % — ABNORMAL LOW (ref 39.0–52.0)
Hemoglobin: 10.2 g/dL — ABNORMAL LOW (ref 13.0–17.0)
Immature Granulocytes: 1 %
Lymphocytes Relative: 24 %
Lymphs Abs: 1.6 10*3/uL (ref 0.7–4.0)
MCH: 26.2 pg (ref 26.0–34.0)
MCHC: 31.5 g/dL (ref 30.0–36.0)
MCV: 83.1 fL (ref 80.0–100.0)
Monocytes Absolute: 0.9 10*3/uL (ref 0.1–1.0)
Monocytes Relative: 13 %
Neutro Abs: 4 10*3/uL (ref 1.7–7.7)
Neutrophils Relative %: 59 %
Platelets: 161 10*3/uL (ref 150–400)
RBC: 3.9 MIL/uL — ABNORMAL LOW (ref 4.22–5.81)
RDW: 15.9 % — ABNORMAL HIGH (ref 11.5–15.5)
WBC: 6.7 10*3/uL (ref 4.0–10.5)
nRBC: 0 % (ref 0.0–0.2)

## 2020-12-11 LAB — COMPREHENSIVE METABOLIC PANEL
ALT: 29 U/L (ref 0–44)
AST: 25 U/L (ref 15–41)
Albumin: 2.7 g/dL — ABNORMAL LOW (ref 3.5–5.0)
Alkaline Phosphatase: 82 U/L (ref 38–126)
Anion gap: 8 (ref 5–15)
BUN: 30 mg/dL — ABNORMAL HIGH (ref 8–23)
CO2: 22 mmol/L (ref 22–32)
Calcium: 8.9 mg/dL (ref 8.9–10.3)
Chloride: 107 mmol/L (ref 98–111)
Creatinine, Ser: 1.3 mg/dL — ABNORMAL HIGH (ref 0.61–1.24)
GFR, Estimated: 58 mL/min — ABNORMAL LOW (ref >60)
Glucose, Bld: 97 mg/dL (ref 70–99)
Potassium: 3.7 mmol/L (ref 3.5–5.1)
Sodium: 137 mmol/L (ref 135–145)
Total Bilirubin: 0.7 mg/dL (ref 0.3–1.2)
Total Protein: 6.4 g/dL — ABNORMAL LOW (ref 6.5–8.1)

## 2020-12-11 LAB — GLUCOSE, CAPILLARY
Glucose-Capillary: 109 mg/dL — ABNORMAL HIGH (ref 70–99)
Glucose-Capillary: 110 mg/dL — ABNORMAL HIGH (ref 70–99)
Glucose-Capillary: 120 mg/dL — ABNORMAL HIGH (ref 70–99)
Glucose-Capillary: 122 mg/dL — ABNORMAL HIGH (ref 70–99)
Glucose-Capillary: 127 mg/dL — ABNORMAL HIGH (ref 70–99)
Glucose-Capillary: 138 mg/dL — ABNORMAL HIGH (ref 70–99)
Glucose-Capillary: 89 mg/dL (ref 70–99)

## 2020-12-11 NOTE — Progress Notes (Signed)
[facility] PROGRESS NOTE  Clifford [name] ZOX:[id number] DOB: [birth year] DOA: 10/14/2020 PCP: Pcp, No  Status: Remains inpatient appropriate because:Unsafe d/c plan  Dispo: The patient is from: Home              Anticipated d/c is to: Home vs SNF              Patient currently is medically stable to d/c.   Difficult to place patient Yes              Barriers to discharge: Needs Medicaid number which is  pending-[facility]-unclear if they were made aware of current admission- does have Medicare Part A   Level of care: Med-Surg   Code Status: DNR Family Communication: No family at bedside DVT prophylaxis: Eliquis COVID vaccination status: Unknown  HPI: 70 y.o. 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  He was managed with supportive care-hospital course was complicated by worsening somnolence-further imaging studies showed cytotoxic edema with some mass-effect on the right lateral ventricle.  Neurology continued to follow closely-he was managed with continued supportive care.  He continued to exhibit significant left-sided hemiplegia/dysphagia-ultimately required G-tube placement.  Plans are for SNF on discharge.  Pradaxa prescribed prior to this hospitalization but was held due to petechial hemorrhage/severe dysphagia.  Subsequently been transitioned to eliquis.  Lung nodule 8 mm left upper lobe which is stable and can be followed up by PCP after discharge.  On 9/27 received information from his [place] [facility] that he has had acute hepatitis from Eliquis in the past.  Discussed with pharmacist and attending MD and since current LFTs are within normal limits plan is to continue Eliquis and  follow LFTs weekly.   Subjective: Awake and not want a talk this morning.    Objective: Vitals:   12/11/20 0359 12/11/20 0825  BP: (!) 154/72 (!) 147/62  Pulse: 70 (!) 56  Resp: 20   Temp: 98.1 F (36.7 C) 97.8 F (36.6 C)  SpO2:  98%    Intake/Output Summary (Last 24 hours) at 12/11/2020 1043 Last data filed at 12/11/2020 0400 Gross per 24 hour  Intake --  Output 1000 ml  Net -1000 ml     Filed Weights   12/07/20 0358 12/08/20 0500 12/09/20 0500  Weight: 85.2 kg 85.3 kg 85 kg    Exam: General exam: In no acute distress. Respiratory system: Good air movement and clear to auscultation. Cardiovascular system: S1 & S2 heard, RRR. No JVD. Gastrointestinal system: Abdomen is nondistended, soft and nontender.  Central nervous system: 2-12 are in tact except for left visual field deficit and right gaze with a mild left facial droop.  Right foot drop Extremities: Offloading boot leg splint to the right upper extremity. Skin: No rashes, lesions or ulcers Psychiatry: No judgment or insight of medical condition.  Stroke work-up: 8/3>> CT angio head: No intracranial large vessel occlusion 8/3>> CT angio neck: Minimal atherosclerotic change in the bulb internal carotid artery bulbs.  No stenosis. 8/4>> A1c: 5.9 8/4>> LDL: 75 8/4>> Echo: EF 81-19%, grade 1 diastolic dysfunction. 8/11>> CT head: Expected evolution of Rt MCA/PCA territory infarct w petechial hemorrhage/mild edema. 8/11>> MRI brain: Right MCA infarct-increased cytotoxic edema/petechial hemorrhage without midline shift. 8/16>> CT head: Evolving right posterior MCA  territory infarct with increased cytotoxic edema.  Moderate mass-effect on the right lateral ventricle. 8/25>> MRI brain: Progressive swelling in the right hemisphere infarction-minimal petechial blood products.  Small acute infarctions of the inferior basal ganglia on the right.  8/12-10/2011>>LTM EEG: No seizures. 8/12:>>Spot EEG: No seizures     Assessment/Plan: Acute problems: Right MCA/PCA territory infarct with petechial hemorrhage/evolution-with mild edema/slight midline shift on repeat CT head on 8/16:  Sequelae of dense left hemiplegia and left-sided neglect-unable to ambulate at this time-continue offloading boots and LUE splint Continue Skelaxin along with HS Flexeril for spasticity Therapies document slow/incremental progress  Visual hallucinations Secondary to stroke physiology Currently not experiencing hallucinations as of 10/07 Continue Seroquel 25 mg HS  Left knee pain X-ray reveals mild degenerative changes consistent with osteoarthritis Possibly worsened by ongoing spasticity post stroke noting this is his affected side Begin scheduled Tylenol-LFTs normal on 10/2  Persistent nausea Resolved Continue Protonix and Pepcid   Possible focal seizure:  Continue Keppra/Topamax.   Headache:  Stable on Topamax.  Probably related to CVA/recent cytotoxic edema.   Fever on 9/10:  Resolved-cultures negative.   Completed 5-day course of cefepime.   HTN:  continue carvedilol/amlodipine/hydralazine prn   HLD:  Continue Lipitor   AKI on CKD stage IIIa:  AKI likely hemodynamically mediated-resolved-creatinine back to baseline of 1.6-1.0   Rate 1 diastolic dysfunction HFpEF:  Euvolemic on exam. 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   Dysphagia Nutrition Problem: Inadequate oral intake noting oral intake remains marginal therefore continuing nocturnal tube feedings Etiology: dysphagia Signs/Symptoms: SLP recommending D1 diet Interventions: Tube feeding, Prostat   Obesity: Estimated body mass index is 30.12 kg/m as calculated from the  following:   Height as of this encounter: 5\' 5"  (1.651 m).   Weight as of this encounter: 82.1 kg.     Data Reviewed: Basic Metabolic Panel: Recent Labs  Lab 12/06/20 0341 12/11/20 0242  NA 135 137  K 3.9 3.7  CL 105 107  CO2 21* 22  GLUCOSE 116* 97  BUN 33* 30*  CREATININE 1.29* 1.30*  CALCIUM 8.8* 8.9  MG 2.0  --   PHOS 3.8  --     Liver Function Tests: Recent Labs  Lab 12/06/20 0341 12/11/20 0242  AST 37 25  ALT 37 29  ALKPHOS 72 82  BILITOT 0.5 0.7  PROT 6.3* 6.4*  ALBUMIN 2.6* 2.7*    CBC: Recent Labs  Lab 12/06/20 0341 12/11/20 0242  WBC 6.2 6.7  NEUTROABS  --  4.0  HGB 9.9* 10.2*  HCT 31.4* 32.4*  MCV 82.0 83.1  PLT 176 161     CBG: Recent Labs  Lab 12/10/20 1606 12/10/20 2052 12/11/20 0015 12/11/20 0444 12/11/20 0827  GLUCAP 98 121* 120* 138* 110*       Scheduled Meds:  acetaminophen (TYLENOL) oral liquid 160 mg/5 mL  650 mg Per Tube Q6H   apixaban  5 mg Per Tube BID   atorvastatin  40 mg Per Tube Daily   carvedilol  12.5 mg Per Tube BID WC   cyclobenzaprine  5 mg Per Tube QHS   famotidine  20 mg Per Tube BID   feeding supplement  237 mL Oral BID BM   feeding supplement (JEVITY 1.5 CAL/FIBER)  660 mL Per Tube Q24H   feeding supplement (PROSource TF)  45 mL Per Tube BID   free water  200 mL Per Tube Q4H   insulin aspart  0-9 Units Subcutaneous Q4H   levETIRAcetam  250 mg Per Tube BID   mouth rinse  15 mL Mouth Rinse BID   metaxalone  800 mg Per Tube TID   modafinil  100 mg Per Tube Daily   pantoprazole sodium  40 mg Per Tube BID   QUEtiapine  25 mg Per Tube QHS   saccharomyces boulardii  250 mg Per Tube BID   topiramate  50 mg Per Tube BID     Principal Problem:   Left-sided weakness Active Problems:   Stroke ([place])   Essential hypertension   Mixed hyperlipidemia   Prediabetes   History of pulmonary embolism   Chronic anticoagulation   Nicotine dependence, cigarettes, uncomplicated   Petechial hemorrhage   Acute  stroke due to ischemia [facility])   Advanced care planning/counseling discussion   Goals of care, counseling/discussion   Palliative care by specialist   Consultants: Neurology Interventional radiology  Procedures: Cortrack tube EEG PEG tube  Antibiotics: Cefepime 9/10 through 9/14 Vancomycin dose on 9/10   Time spent: 25 minutes    Charlynne Cousins ANP  [facility] 7 am - 330 pm/M-F for direct patient care and secure chat Please refer to Amion for contact info 58  days

## 2020-12-11 NOTE — Plan of Care (Signed)
  Problem: Activity: Goal: Risk for activity intolerance will decrease Outcome: Progressing   Problem: Coping: Goal: Level of anxiety will decrease Outcome: Progressing   Problem: Elimination: Goal: Will not experience complications related to bowel motility Outcome: Progressing   

## 2020-12-11 NOTE — Plan of Care (Signed)

## 2020-12-12 DIAGNOSIS — R531 Weakness: Secondary | ICD-10-CM | POA: Diagnosis not present

## 2020-12-12 LAB — GLUCOSE, CAPILLARY
Glucose-Capillary: 115 mg/dL — ABNORMAL HIGH (ref 70–99)
Glucose-Capillary: 120 mg/dL — ABNORMAL HIGH (ref 70–99)
Glucose-Capillary: 111 mg/dL — ABNORMAL HIGH (ref 70–99)
Glucose-Capillary: 121 mg/dL — ABNORMAL HIGH (ref 70–99)
Glucose-Capillary: 142 mg/dL — ABNORMAL HIGH (ref 70–99)

## 2020-12-12 NOTE — Plan of Care (Signed)

## 2020-12-12 NOTE — Progress Notes (Signed)
[facility] PROGRESS NOTE  Clifford [name] RFF:[id number] DOB: 1950/06/25 DOA: 10/14/2020 PCP: Pcp, No  Status: Remains inpatient appropriate because:Unsafe d/c plan  Dispo: The patient is from: Home              Anticipated d/c is to: Home vs SNF              Patient currently is medically stable to d/c.   Difficult to place patient Yes              Barriers to discharge: Needs Medicaid number which is  pending-[facility]-unclear if they were made aware of current admission- does have Medicare Part A   Level of care: Med-Surg   Code Status: DNR Family Communication: No family at bedside DVT prophylaxis: Eliquis COVID vaccination status: Unknown  HPI: 70 y.o. 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  He was managed with supportive care-hospital course was complicated by worsening somnolence-further imaging studies showed cytotoxic edema with some mass-effect on the right lateral ventricle.  Neurology continued to follow closely-he was managed with continued supportive care.  He continued to exhibit significant left-sided hemiplegia/dysphagia-ultimately required G-tube placement.  Plans are for SNF on discharge.  Pradaxa prescribed prior to this hospitalization but was held due to petechial hemorrhage/severe dysphagia.  Subsequently been transitioned to eliquis.  Lung nodule 8 mm left upper lobe which is stable and can be followed up by PCP after discharge.  On 9/27 received information from his [place] [facility] that he has had acute hepatitis from Eliquis in the past.  Discussed with pharmacist and attending MD and since current LFTs are within normal limits plan is to continue Eliquis and  follow LFTs weekly.   Subjective: Awake and not want a talk this morning.    Objective: Vitals:   12/11/20 2330 12/12/20 0341  BP: (!) 149/70 (!) 143/84  Pulse: 61 68  Resp: 18 16  Temp: 98.6 F (37 C) 98.4 F (36.9 C)  SpO2: 100% 100%    Intake/Output Summary (Last 24 hours) at 12/12/2020 1028 Last data filed at 12/12/2020 0348 Gross per 24 hour  Intake --  Output 600 ml  Net -600 ml     Filed Weights   12/07/20 0358 12/08/20 0500 12/09/20 0500  Weight: 85.2 kg 85.3 kg 85 kg    Exam: General exam: In no acute distress. Respiratory system: Good air movement and clear to auscultation. Cardiovascular system: S1 & S2 heard, RRR. No JVD. Gastrointestinal system: Abdomen is nondistended, soft and nontender.  Central nervous system: 2-12 are in tact except for left visual field deficit and right gaze with a mild left facial droop.  Right foot drop Extremities: Offloading boot leg splint to the right upper extremity. Skin: No rashes, lesions or ulcers Psychiatry: No judgment or insight of medical condition.  Stroke work-up: 8/3>> CT angio head: No intracranial large vessel occlusion 8/3>> CT angio neck: Minimal atherosclerotic change in the bulb internal carotid artery bulbs.  No stenosis. 8/4>> A1c: 5.9 8/4>> LDL: 75 8/4>> Echo: EF 35-70%, grade 1 diastolic dysfunction. 8/11>> CT head: Expected evolution of Rt MCA/PCA territory infarct w petechial hemorrhage/mild edema. 8/11>> MRI brain: Right MCA infarct-increased cytotoxic edema/petechial hemorrhage without midline shift. 8/16>> CT head: Evolving right posterior MCA territory  infarct with increased cytotoxic edema.  Moderate mass-effect on the right lateral ventricle. 8/25>> MRI brain: Progressive swelling in the right hemisphere infarction-minimal petechial blood products.  Small acute infarctions of the inferior basal ganglia on the right.  8/12-10/2011>>LTM EEG: No seizures. 8/12:>>Spot EEG: No seizures     Assessment/Plan: Acute problems: Right MCA/PCA territory infarct with petechial hemorrhage/evolution-with mild edema/slight midline shift on repeat CT head on 8/16:  Sequelae of dense left hemiplegia and left-sided neglect-unable to ambulate at this time-continue offloading boots and LUE splint Continue Skelaxin along with HS Flexeril for spasticity Therapies document slow/incremental progress  Visual hallucinations Secondary to stroke physiology Currently not experiencing hallucinations as of 10/07 Continue Seroquel 25 mg HS  Left knee pain X-ray reveals mild degenerative changes consistent with osteoarthritis Possibly worsened by ongoing spasticity post stroke noting this is his affected side Begin scheduled Tylenol-LFTs normal on 10/2  Persistent nausea Resolved Continue Protonix and Pepcid   Possible focal seizure:  Continue Keppra/Topamax.   Headache:  Stable on Topamax.  Probably related to CVA/recent cytotoxic edema.   Fever on 9/10:  Resolved-cultures negative.   Completed 5-day course of cefepime.   HTN:  continue carvedilol/amlodipine/hydralazine prn   HLD:  Continue Lipitor   AKI on CKD stage IIIa:  AKI likely hemodynamically mediated-resolved-creatinine back to baseline of 3.5-5.7   Rate 1 diastolic dysfunction HFpEF:  Euvolemic on exam. 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   Dysphagia Nutrition Problem: Inadequate oral intake noting oral intake remains marginal therefore continuing nocturnal tube feedings Etiology: dysphagia Signs/Symptoms: SLP recommending D1 diet Interventions: Tube feeding, Prostat   Obesity: Estimated body mass index is 30.12 kg/m as calculated from the  following:   Height as of this encounter: 5\' 5"  (1.651 m).   Weight as of this encounter: 82.1 kg.     Data Reviewed: Basic Metabolic Panel: Recent Labs  Lab 12/06/20 0341 12/11/20 0242  NA 135 137  K 3.9 3.7  CL 105 107  CO2 21* 22  GLUCOSE 116* 97  BUN 33* 30*  CREATININE 1.29* 1.30*  CALCIUM 8.8* 8.9  MG 2.0  --   PHOS 3.8  --     Liver Function Tests: Recent Labs  Lab 12/06/20 0341 12/11/20 0242  AST 37 25  ALT 37 29  ALKPHOS 72 82  BILITOT 0.5 0.7  PROT 6.3* 6.4*  ALBUMIN 2.6* 2.7*    CBC: Recent Labs  Lab 12/06/20 0341 12/11/20 0242  WBC 6.2 6.7  NEUTROABS  --  4.0  HGB 9.9* 10.2*  HCT 31.4* 32.4*  MCV 82.0 83.1  PLT 176 161     CBG: Recent Labs  Lab 12/11/20 1703 12/11/20 2006 12/11/20 2332 12/12/20 0447 12/12/20 0753  GLUCAP 109* 122* 127* 115* 120*       Scheduled Meds:  acetaminophen (TYLENOL) oral liquid 160 mg/5 mL  650 mg Per Tube Q6H   apixaban  5 mg Per Tube BID   atorvastatin  40 mg Per Tube Daily   carvedilol  12.5 mg Per Tube BID WC   cyclobenzaprine  5 mg Per Tube QHS   famotidine  20 mg Per Tube BID   feeding supplement  237 mL Oral BID BM   feeding supplement (JEVITY 1.5 CAL/FIBER)  660 mL Per Tube Q24H   feeding supplement (PROSource TF)  45 mL Per Tube BID   free water  200 mL Per Tube Q4H   insulin aspart  0-9 Units Subcutaneous Q4H   levETIRAcetam  250 mg Per Tube BID   mouth rinse  15 mL Mouth Rinse BID   metaxalone  800 mg Per Tube TID   modafinil  100 mg Per Tube Daily   pantoprazole sodium  40 mg Per Tube BID   QUEtiapine  25 mg Per Tube QHS   saccharomyces boulardii  250 mg Per Tube BID   topiramate  50 mg Per Tube BID     Principal Problem:   Left-sided weakness Active Problems:   Stroke ([place])   Essential hypertension   Mixed hyperlipidemia   Prediabetes   History of pulmonary embolism   Chronic anticoagulation   Nicotine dependence, cigarettes, uncomplicated   Petechial hemorrhage   Acute  stroke due to ischemia [facility])   Advanced care planning/counseling discussion   Goals of care, counseling/discussion   Palliative care by specialist   Consultants: Neurology Interventional radiology  Procedures: Cortrack tube EEG PEG tube  Antibiotics: Cefepime 9/10 through 9/14 Vancomycin dose on 9/10   Time spent: 25 minutes    Charlynne Cousins ANP  [facility] 7 am - 330 pm/M-F for direct patient care and secure chat Please refer to Amion for contact info 59  days

## 2020-12-12 NOTE — Plan of Care (Signed)
  Problem: Self-Care: Goal: Ability to communicate needs accurately will improve Outcome: Progressing   Problem: Nutrition: Goal: Risk of aspiration will decrease Outcome: Progressing Goal: Dietary intake will improve Outcome: Progressing

## 2020-12-13 DIAGNOSIS — R531 Weakness: Secondary | ICD-10-CM | POA: Diagnosis not present

## 2020-12-13 LAB — GLUCOSE, CAPILLARY
Glucose-Capillary: 102 mg/dL — ABNORMAL HIGH (ref 70–99)
Glucose-Capillary: 118 mg/dL — ABNORMAL HIGH (ref 70–99)
Glucose-Capillary: 127 mg/dL — ABNORMAL HIGH (ref 70–99)
Glucose-Capillary: 131 mg/dL — ABNORMAL HIGH (ref 70–99)
Glucose-Capillary: 131 mg/dL — ABNORMAL HIGH (ref 70–99)
Glucose-Capillary: 132 mg/dL — ABNORMAL HIGH (ref 70–99)
Glucose-Capillary: 132 mg/dL — ABNORMAL HIGH (ref 70–99)

## 2020-12-13 MED ORDER — JEVITY 1.5 CAL/FIBER PO LIQD: 660.0000 mL | ORAL | Status: DC

## 2020-12-13 MED ORDER — APIXABAN 5 MG PO TABS
5.0000 mg | ORAL_TABLET | Freq: Two times a day (BID) | ORAL | Status: DC
Start: 2020-12-13 — End: 2021-05-16

## 2020-12-13 MED ORDER — CARVEDILOL 12.5 MG PO TABS: 12.5000 mg | ORAL_TABLET | Freq: Two times a day (BID) | ORAL | Status: DC

## 2020-12-13 MED ORDER — TOPIRAMATE 50 MG PO TABS: 50.0000 mg | ORAL_TABLET | Freq: Two times a day (BID) | ORAL | Status: AC

## 2020-12-13 MED ORDER — LEVETIRACETAM 100 MG/ML PO SOLN: 250.0000 mg | Freq: Two times a day (BID) | ORAL | 12 refills | Status: AC

## 2020-12-13 MED ORDER — ACETAMINOPHEN 160 MG/5ML PO SOLN: 650.0000 mg | Freq: Four times a day (QID) | ORAL | 0 refills | Status: DC

## 2020-12-13 MED ORDER — METAXALONE 800 MG PO TABS
800.0000 mg | ORAL_TABLET | Freq: Three times a day (TID) | ORAL | Status: DC
Start: 2020-12-13 — End: 2021-05-16

## 2020-12-13 MED ORDER — SACCHAROMYCES BOULARDII 250 MG PO CAPS: 250.0000 mg | ORAL_CAPSULE | Freq: Two times a day (BID) | ORAL | Status: DC

## 2020-12-13 MED ORDER — MODAFINIL 100 MG PO TABS: 100.0000 mg | ORAL_TABLET | Freq: Every day | ORAL | Status: DC

## 2020-12-13 MED ORDER — FAMOTIDINE 40 MG/5ML PO SUSR: 20.0000 mg | Freq: Two times a day (BID) | ORAL | 0 refills | Status: DC

## 2020-12-13 MED ORDER — QUETIAPINE FUMARATE 25 MG PO TABS: 25.0000 mg | ORAL_TABLET | Freq: Every day | ORAL | Status: DC

## 2020-12-13 MED ORDER — ENSURE ENLIVE PO LIQD: 237.0000 mL | Freq: Two times a day (BID) | ORAL | 12 refills | Status: DC

## 2020-12-13 MED ORDER — POLYETHYLENE GLYCOL 3350 17 G PO PACK: 17.0000 g | PACK | Freq: Every day | ORAL | 0 refills | Status: DC | PRN

## 2020-12-13 MED ORDER — INSULIN ASPART 100 UNIT/ML IJ SOLN: 0.0000 [IU] | INTRAMUSCULAR | 11 refills | Status: DC

## 2020-12-13 MED ORDER — ATORVASTATIN CALCIUM 40 MG PO TABS: 40.0000 mg | ORAL_TABLET | Freq: Every day | ORAL | Status: DC

## 2020-12-13 MED ORDER — CYCLOBENZAPRINE HCL 5 MG PO TABS: 5.0000 mg | ORAL_TABLET | Freq: Every day | ORAL | 0 refills | Status: DC

## 2020-12-13 MED ORDER — PANTOPRAZOLE SODIUM 40 MG PO PACK: 40.0000 mg | PACK | Freq: Two times a day (BID) | ORAL | Status: DC

## 2020-12-13 MED ORDER — FREE WATER: 200.0000 mL | Status: DC

## 2020-12-13 MED ORDER — PROSOURCE TF PO LIQD: 45.0000 mL | Freq: Two times a day (BID) | ORAL | Status: DC

## 2020-12-13 NOTE — Progress Notes (Signed)
Occupational Therapy Treatment Patient Details Name: Clifford [name] MRN: [id number] DOB: 04-20-1950 Today's Date: 12/13/2020   History of present illness 70 yo male presented to ED on 8/11 for R sided headache and worsening L UE weakness. Patient recently admitted 8/3-8/5 for R CVA with L visual deficits and L sided weakness. MRI with mild enlargement of R MCA/border zone infarct since 10/07/20 MRI and increased cytotoxic edema and petechial hemorrhage without midline shift. Palliative Care consulted and in detailed family discussions, with prognosis provided by Neurology (more likely able to regain ability to talk and participate in self cares, unlikely to regain ability to walk, uncertain if able to regain ability to swallow, very unlikely to improve to full independence), it was decided to place PEG and pursue SNF rehab, PEG was placed 8/30. 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   OT comments  Clifford [name] is incrementally progressing, goals updated. Session spent with RN and NT with bed level ADLs. Pt was able to bathe LUE, trunk and upper R thigh with increased cues and min-moderate cues; however continues to be max A +2 for thorough bathing tasks. Rolling max A +2. Pt with complaints of pain with PROM of LUE and LLE. He continues to benefit from OT acutely. D/c recommendation remains appropriate.    Recommendations for follow up therapy are one component of a multi-disciplinary discharge planning process, led by the attending physician.  Recommendations may be updated based on patient status, additional functional criteria and insurance authorization.    Follow Up Recommendations  SNF    Equipment Recommendations  None recommended by OT       Precautions / Restrictions Precautions Precautions: Fall Precaution Comments: L visual field deficit, R gaze preference, L inattention, L hemparesis; G-tube and abdominal binder; peri skin breakdown Required Braces or Orthoses: Other  Brace Splint/Cast: soft elbow splint, prefab resting hand splint Other Brace: prevalon boots, L elbow splint and resting hand splint Restrictions Weight Bearing Restrictions: No       Mobility Bed Mobility Overal bed mobility: Needs Assistance Bed Mobility: Rolling Rolling: Max assist;+2 for physical assistance         General bed mobility comments: pt able to assist in L sidelying by holding rail with RUE    Transfers                      Balance                                           ADL either performed or assessed with clinical judgement   ADL Overall ADL's : Needs assistance/impaired         Upper Body Bathing: Moderate assistance;Bed level Upper Body Bathing Details (indicate cue type and reason): cues for pt to bathe past midline Lower Body Bathing: Maximal assistance;+2 for physical assistance;+2 for safety/equipment Lower Body Bathing Details (indicate cue type and reason): pt abel to bathe both upper thighs with incrased time, cues and physical assistance Upper Body Dressing : Maximal assistance       Toilet Transfer: Total assistance Toilet Transfer Details (indicate cue type and reason): at bed level, BM and urine upon arrival           General ADL Comments: bed level ADL wtih RN and tech for ADLs     Vision   Alignment/Gaze Preference: Head turned;Gaze right  Additional Comments: pt able to attent to stimuli in R visual field better   Perception     Praxis      Cognition Arousal/Alertness: Awake/alert Behavior During Therapy: Flat affect Overall Cognitive Status: Impaired/Different from baseline                                 General Comments: pt oriented to self and place; following all simple commands this session with incrsed time. Continues to have poor insight into his deficits        Exercises     Shoulder Instructions       General Comments no new concerns noted    Pertinent  Vitals/ Pain       Pain Assessment: Faces Faces Pain Scale: Hurts even more Pain Location: LUE and LLE PROM Pain Descriptors / Indicators: Grimacing;Discomfort;Moaning Pain Intervention(s): Limited activity within patient's tolerance;Monitored during session  Home Living                                          Prior Functioning/Environment              Frequency  Min 2X/week        Progress Toward Goals  OT Goals(current goals can now be found in the care plan section)  Progress towards OT goals: Progressing toward goals  Acute Rehab OT Goals Patient Stated Goal: rehab tomorrow OT Goal Formulation: Patient unable to participate in goal setting Time For Goal Achievement: 12/09/20 Potential to Achieve Goals: Fair ADL Goals Pt Will Perform Grooming: with mod assist;bed level Pt Will Perform Lower Body Dressing: with mod assist;sitting/lateral leans Pt Will Transfer to Toilet: with max assist;squat pivot transfer Pt Will Perform Toileting - Clothing Manipulation and hygiene: with mod assist;bed level Additional ADL Goal #1: pt will locate 3 grooming items placed in L visual field with moderate cues  Plan Discharge plan remains appropriate    Co-evaluation                 AM-PAC OT "6 Clicks" Daily Activity     Outcome Measure   Help from another person eating meals?: Total Help from another person taking care of personal grooming?: A Lot Help from another person toileting, which includes using toliet, bedpan, or urinal?: Total Help from another person bathing (including washing, rinsing, drying)?: A Lot Help from another person to put on and taking off regular upper body clothing?: A Lot Help from another person to put on and taking off regular lower body clothing?: Total 6 Click Score: 9    End of Session Equipment Utilized During Treatment: Other (comment)  OT Visit Diagnosis: Unsteadiness on feet (R26.81);Muscle weakness (generalized)  (M62.81);Other symptoms and signs involving cognitive function   Activity Tolerance Patient tolerated treatment well   Patient Left in bed;with call [place]/phone within reach;with bed alarm set   Nurse Communication Mobility status        Time: 6759-1638 OT Time Calculation (min): 19 min  Charges: OT General Charges $OT Visit: 1 Visit OT Treatments $Self Care/Home Management : 8-22 mins   Delisa Finck A Walt Geathers 12/13/2020, 5:53 PM

## 2020-12-13 NOTE — Plan of Care (Signed)

## 2020-12-13 NOTE — TOC Progression Note (Addendum)
Transition of Care [facility]) - Progression Note    Patient Details  Name: Clifford [name] MRN: [id number] Date of Birth: 1951-01-31  Transition of Care [facility]) CM/SW Contact  Curlene Labrum, RN Phone Number: 12/13/2020, 1:56 PM  Clinical Narrative:    CM and MSW with DTP Team will continue to explore SNF options for admission.  The patient was recently approved for [place] Medicaid per [facility] financial counseling - new Medicaid # placed on the [facility] and note left with SNF facilities regarding this matter.  CM re-faxed out to area [facility] facilities in the hub: [place], [place] with [place] [facility] - will review for LTC admission Dyane Dustman, CM - will review for LTC admission to [facility], [place]- left message with admissions [facility] [place] - Sherre Scarlet, CM - faxed to facility [phone] [facility] [place] - faxed to facility [facility] in [place] - has LTC beds - under review [facility] - has LTC bed - under review [facility] - has LTC beds - under review  CM and MSW with DTP will continue to follow for [facility] placement.   12/13/2020 1508 - CM spoke with Audelia Acton, CM with [place] facilities and [place] made a bed offer to the patient.  CM called and spoke with the patient's brother, Ria Comment on the phone and the brother accepted the bed offer.  Dorian Pod, CM at [facility] will have a SNF bed ready in the morning for transfer.  No COVID screen is needed.  Lissa Merlin, NP is aware and will placed discharge orders and discharge summary in the morning.   Expected Discharge Plan: Skilled Nursing Facility Barriers to Discharge: SNF Pending bed offer  Expected Discharge Plan and Services Expected Discharge Plan: [place] In-house Referral: Clinical Social Work, Development worker, community Discharge Planning Services: CM Consult Post Acute Care Choice: [place] Living arrangements for the past 2 months: Single  Family Home                                       Social Determinants of Health (SDOH) Interventions    Readmission Risk Interventions No flowsheet data found.

## 2020-12-13 NOTE — NC FL2 (Addendum)
[place] LEVEL OF CARE SCREENING TOOL     IDENTIFICATION  Patient Name: Clifford [name] Birthdate: [birth year] Sex: male Admission Date (Current Location): 10/14/2020  [facility] and [place] Number:  Kathleen Argue Approved Medicaid # [id number] O per financial counseling Facility and Address:  [facility], [place] [address] [place], [place] [address]      Provider Number: [id number]  Attending Physician Name and Address:  Charlynne Cousins, MD  Relative Name and Phone Number:  Deneen Harts [phone], Ria Comment, brother - [phone]    Current Level of Care: Hospital Recommended Level of Care: [place] Prior Approval Number:    Date Approved/Denied:   PASRR Number: Manual review  Discharge Plan: SNF    Current Diagnoses: Patient Active Problem List   Diagnosis Date Noted   Advanced care planning/counseling discussion    Goals of care, counseling/discussion    Palliative care by specialist    Left-sided weakness 10/14/2020   Prediabetes 10/14/2020   History of pulmonary embolism 10/14/2020   Chronic anticoagulation 10/14/2020   Nicotine dependence, cigarettes, uncomplicated 76/81/1572   Petechial hemorrhage 10/14/2020   Acute stroke due to ischemia ([place]) 10/14/2020   Stroke ([place]) 10/06/2020   Essential hypertension 10/06/2020   Mixed hyperlipidemia 10/06/2020    Orientation RESPIRATION BLADDER Height & Weight     Self, Time, Situation, Place  Normal External catheter, Incontinent Weight: 85 kg Height:  5\' 5"  (165.1 cm)  BEHAVIORAL SYMPTOMS/MOOD NEUROLOGICAL BOWEL NUTRITION STATUS    Convulsions/Seizures Incontinent Feeding tube (Osmolite 1.5)  AMBULATORY STATUS COMMUNICATION OF NEEDS Skin   Extensive Assist Non-Verbally Other (Comment) (nonpressure wound - penis, Left buttocks wound)                       Personal Care Assistance Level of Assistance  Bathing, Feeding, Dressing Bathing Assistance:  Maximum assistance Feeding assistance: Maximum assistance Dressing Assistance: Maximum assistance     Functional Limitations Info  Speech, Hearing, Sight Sight Info: Adequate (Wears glasses) Hearing Info: Adequate Speech Info: Impaired    SPECIAL CARE FACTORS FREQUENCY  PT (By licensed PT), OT (By licensed OT), Speech therapy     PT Frequency: 3-5 x per week OT Frequency: 3-5 x per week     Speech Therapy Frequency: per discharge orders      Contractures Contractures Info: Not present    Additional Factors Info  Allergies, Code Status Code Status Info: DNR Allergies Info: Apixaban, Lisinopril, Simvastatin   Insulin Sliding Scale Info: See discharge summary       Current Medications (12/13/2020):  This is the current hospital active medication list Current Facility-Administered Medications  Medication Dose Route Frequency Provider Last Rate Last Admin   acetaminophen (TYLENOL) 160 MG/5ML solution 650 mg  650 mg Per Tube Q6H Samella Parr, NP   650 mg at 12/13/20 0916   apixaban (ELIQUIS) tablet 5 mg  5 mg Per Tube BID Thurnell Lose, MD   5 mg at 12/13/20 0916   atorvastatin (LIPITOR) tablet 40 mg  40 mg Per Tube Daily Darlina Sicilian, RPH   40 mg at 12/13/20 0916   carvedilol (COREG) tablet 12.5 mg  12.5 mg Per Tube BID WC Thurnell Lose, MD   12.5 mg at 12/13/20 0916   cyclobenzaprine (FLEXERIL) tablet 5 mg  5 mg Per Tube QHS Charlynne Cousins, MD   5 mg at 12/12/20 2227   diclofenac Sodium (VOLTAREN) 1 % topical gel  2 g  2 g Topical QID PRN Jonetta Osgood, MD       famotidine (PEPCID) 40 MG/5ML suspension 20 mg  20 mg Per Tube BID Samella Parr, NP   20 mg at 12/13/20 0925   feeding supplement (ENSURE ENLIVE / ENSURE PLUS) liquid 237 mL  237 mL Oral BID BM Thurnell Lose, MD   237 mL at 12/13/20 1308   feeding supplement (JEVITY 1.5 CAL/FIBER) liquid 660 mL  660 mL Per Tube Q24H Thurnell Lose, MD   660 mL at 12/12/20 1846   feeding  supplement (PROSource TF) liquid 45 mL  45 mL Per Tube BID Shelly Coss, MD   45 mL at 12/13/20 0916   free water 200 mL  200 mL Per Tube Q4H Samella Parr, NP   200 mL at 12/13/20 1200   guaiFENesin (ROBITUSSIN) 100 MG/5ML solution 100 mg  5 mL Per Tube Q4H PRN Regalado, Belkys A, MD   100 mg at 11/14/20 0909   hydrALAZINE (APRESOLINE) injection 10 mg  10 mg Intravenous Q6H PRN Thurnell Lose, MD       insulin aspart (novoLOG) injection 0-9 Units  0-9 Units Subcutaneous Q4H Dana Allan I, MD   1 Units at 12/13/20 0500   levETIRAcetam (KEPPRA) 100 MG/ML solution 250 mg  250 mg Per Tube BID Caren Griffins, MD   250 mg at 12/13/20 0916   lip balm (CARMEX) ointment   Topical PRN Regalado, Belkys A, MD       MEDLINE mouth rinse  15 mL Mouth Rinse BID Dana Allan I, MD   15 mL at 12/13/20 5809   metaxalone (SKELAXIN) tablet 800 mg  800 mg Per Tube TID Samella Parr, NP   800 mg at 12/13/20 0925   modafinil (PROVIGIL) tablet 100 mg  100 mg Per Tube Daily Caren Griffins, MD   100 mg at 12/13/20 0917   ondansetron (ZOFRAN) injection 4 mg  4 mg Intravenous Q6H PRN Vernelle Emerald, MD   4 mg at 12/07/20 0935   pantoprazole sodium (PROTONIX) 40 mg/20 mL oral suspension 40 mg  40 mg Per Tube BID Samella Parr, NP   40 mg at 12/13/20 0916   polyethylene glycol (MIRALAX / GLYCOLAX) packet 17 g  17 g Per Tube Daily PRN Darlina Sicilian, RPH       QUEtiapine (SEROQUEL) tablet 25 mg  25 mg Per Tube QHS Samella Parr, NP   25 mg at 12/12/20 2227   saccharomyces boulardii (FLORASTOR) capsule 250 mg  250 mg Per Tube BID Regalado, Belkys A, MD   250 mg at 12/13/20 0917   topiramate (TOPAMAX) tablet 50 mg  50 mg Per Tube BID Elgergawy, Silver Huguenin, MD   50 mg at 12/13/20 9833     Discharge Medications: Please see discharge summary for a list of discharge medications.  Relevant Imaging Results:  Relevant Lab Results:   Additional Information SS# [id number], Approved for  Medicaid - #[id number] Marcello Fennel, RN

## 2020-12-13 NOTE — Plan of Care (Signed)
  Problem: Clinical Measurements: Goal: Will remain free from infection Outcome: Progressing Goal: Diagnostic test results will improve Outcome: Progressing Goal: Respiratory complications will improve Outcome: Progressing Goal: Cardiovascular complication will be avoided Outcome: Progressing   Problem: Activity: Goal: Risk for activity intolerance will decrease Outcome: Progressing   

## 2020-12-13 NOTE — Progress Notes (Signed)
[facility] PROGRESS NOTE  Clifford [name] IRC:[id number] DOB: 24-Sep-1950 DOA: 10/14/2020 PCP: Pcp, No  Status: Remains inpatient appropriate because:Unsafe d/c plan  Dispo: The patient is from: Home              Anticipated d/c is to: Home vs SNF              Patient currently is medically stable to d/c.   Difficult to place patient Yes              Barriers to discharge: As of 10/7 patient now has active Medicaid as well as Medicare Part A   Level of care: Med-Surg   Code Status: DNR Family Communication: No family at bedside DVT prophylaxis: Eliquis COVID vaccination status: Unknown  HPI: 70 y.o. 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  He was managed with supportive care-hospital course was complicated by worsening somnolence-further imaging studies showed cytotoxic edema with some mass-effect on the right lateral ventricle.  Neurology continued to follow closely-he was managed with continued supportive care.  He continued to exhibit significant left-sided hemiplegia/dysphagia-ultimately required G-tube placement.  Plans are for SNF on discharge.  Pradaxa prescribed prior to this hospitalization but was held due to petechial hemorrhage/severe dysphagia.  Subsequently been transitioned to eliquis.  Lung nodule 8 mm left upper lobe which is stable and can be followed up by PCP after discharge.  On 9/27 received information from his [facility] that he has had acute hepatitis from Eliquis in the past.  Discussed with pharmacist and attending MD and since current LFTs are within normal limits plan is to continue Eliquis and follow LFTs weekly.   Subjective: Awake.Marland Kitchen  Appears somewhat  withdrawn.  Discussed discharge plan with patient and encouraged him we were working diligently to find him a location to discharge to.  Objective: Vitals:   12/12/20 2320 12/13/20 0347  BP: (!) 122/54 (!) 153/75  Pulse: 83 85  Resp: 18 16  Temp: 98.6 F (37 C) 98.6 F (37 C)  SpO2: 100% 100%    Intake/Output Summary (Last 24 hours) at 12/13/2020 0756 Last data filed at 12/13/2020 0600 Gross per 24 hour  Intake 775 ml  Output 1100 ml  Net -325 ml    Filed Weights   12/07/20 0358 12/08/20 0500 12/09/20 0500  Weight: 85.2 kg 85.3 kg 85 kg    Exam: Constitutional: NAD, calm, no further visual hallucinations Respiratory: Bilateral lung sounds clear to auscultation, room air, pulse oximetry 98% Cardiac:: S1-S2, regular pulse, no lower extremity edema, skin warm and dry Abdomen:  LBM 10/10-PEG tube-soft nontender nondistended-continues with inconsistent oral intake Neurologic: CN 2-12 intact except for documented left visual field deficit with right gaze preference as well as left facial droop.  Continued left side neglect with associated hemiplegia and spasticity.  RUE in splint.  Right foot with foot drop. Extremities: Offloading boots on both legs.  Soft splint on LUE.  Psychiatric: Alert and oriented x3-withdrawn affect   Stroke work-up: 8/3>> CT angio head: No intracranial large vessel occlusion 8/3>> CT angio neck: Minimal atherosclerotic change in the bulb internal carotid artery bulbs.  No stenosis. 8/4>> A1c: 5.9 8/4>> LDL: 75 8/4>> Echo: EF 51-02%, grade 1 diastolic dysfunction. 8/11>> CT head: Expected evolution of Rt  MCA/PCA territory infarct w petechial hemorrhage/mild edema. 8/11>> MRI brain: Right MCA infarct-increased cytotoxic edema/petechial hemorrhage without midline shift. 8/16>> CT head: Evolving right posterior MCA territory infarct with increased cytotoxic edema.  Moderate mass-effect on the right lateral ventricle. 8/25>> MRI brain: Progressive swelling  in the right hemisphere infarction-minimal petechial blood products.  Small acute infarctions of the inferior basal ganglia on the right.  8/12-10/2011>>LTM EEG: No seizures. 8/12:>>Spot EEG: No seizures    Assessment/Plan: Acute problems: Right MCA/PCA territory infarct with petechial hemorrhage/evolution-with mild edema/slight midline shift on repeat CT head on 8/16:  Sequelae of dense left hemiplegia and left-sided neglect-unable to ambulate at this time-continue offloading boots and LUE splint Continue Skelaxin along with HS Flexeril for spasticity Therapies document slow/incremental progress-patient's primary issue is dense left hemiplegia without any significant improvement in the symptoms.  Currently requires Hoyer lift to get up in chair and minimal use of RLE to help her self up in bed.  Be continuing to work on edge of bed balance as well of range of motion to neck and trunk.  Visual hallucinations Secondary to stroke physiology and now resolved after initiation of bedtime Seroquel  Left knee pain 2/2 degenerative changes Continue scheduled Tylenol-LFTs normal on 10/2  Persistent nausea Resolved Continue Protonix and Pepcid   Possible focal seizure:  Continue Keppra/Topamax.   Headache:  Stable on Topamax.  Probably related to CVA/recent cytotoxic edema.   Fever on 9/10:  Resolved-cultures negative.   Completed 5-day course of cefepime.   HTN:  continue carvedilol/amlodipine/hydralazine prn   HLD:  Continue Lipitor   AKI on CKD stage IIIa:  AKI likely hemodynamically mediated-resolved-creatinine back to baseline of 8.9-3.8   Rate 1 diastolic dysfunction HFpEF:  Euvolemic on exam. 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   Dysphagia Nutrition Problem: Inadequate oral intake noting oral intake remains marginal therefore continuing nocturnal tube feedings Etiology: dysphagia Signs/Symptoms: SLP recommending D1 diet Interventions: Tube feeding, Prostat   Obesity: Estimated body mass index is 30.12 kg/m as calculated from the following:   Height as of this encounter: 5\' 5"  (1.651 m).   Weight as of this encounter: 82.1 kg.     Data Reviewed: Basic Metabolic Panel: Recent Labs  Lab 12/11/20 0242  NA 137  K 3.7  CL 107  CO2 22  GLUCOSE 97  BUN 30*  CREATININE 1.30*  CALCIUM 8.9   Liver Function Tests: Recent Labs  Lab 12/11/20 0242  AST 25  ALT 29  ALKPHOS 82  BILITOT 0.7  PROT 6.4*  ALBUMIN 2.7*   CBC: Recent Labs  Lab 12/11/20 0242  WBC 6.7  NEUTROABS 4.0  HGB 10.2*  HCT 32.4*  MCV 83.1  PLT 161    CBG: Recent Labs  Lab 12/12/20 1126 12/12/20 1549 12/12/20 2056 12/13/20 0011 12/13/20 0458  GLUCAP 111* 121* 142* 131* 132*      Scheduled Meds:  acetaminophen (TYLENOL) oral liquid 160 mg/5 mL  650 mg Per Tube Q6H   apixaban  5 mg Per Tube BID   atorvastatin  40 mg Per Tube Daily   carvedilol  12.5 mg Per Tube BID WC   cyclobenzaprine  5 mg Per Tube QHS   famotidine  20 mg Per Tube BID  feeding supplement  237 mL Oral BID BM   feeding supplement (JEVITY 1.5 CAL/FIBER)  660 mL Per Tube Q24H   feeding supplement (PROSource TF)  45 mL Per Tube BID   free water  200 mL Per Tube Q4H   insulin aspart  0-9 Units Subcutaneous Q4H   levETIRAcetam  250 mg Per Tube BID   mouth rinse  15 mL Mouth Rinse BID   metaxalone  800 mg Per Tube TID   modafinil  100 mg Per Tube Daily   pantoprazole sodium  40 mg Per Tube BID   QUEtiapine  25 mg Per Tube QHS   saccharomyces boulardii  250 mg Per Tube BID   topiramate  50 mg Per Tube BID     Principal Problem:   Left-sided weakness Active Problems:   Stroke ([place])   Essential hypertension   Mixed  hyperlipidemia   Prediabetes   History of pulmonary embolism   Chronic anticoagulation   Nicotine dependence, cigarettes, uncomplicated   Petechial hemorrhage   Acute stroke due to ischemia [facility])   Advanced care planning/counseling discussion   Goals of care, counseling/discussion   Palliative care by specialist   Consultants: Neurology Interventional radiology  Procedures: Cortrack tube EEG PEG tube  Antibiotics: Cefepime 9/10 through 9/14 Vancomycin dose on 9/10   Time spent: 25 minutes    Erin Hearing ANP  [facility] 7 am - 330 pm/M-F for direct patient care and secure chat Please refer to Amion for contact info 60  days

## 2020-12-14 DIAGNOSIS — I639 Cerebral infarction, unspecified: Secondary | ICD-10-CM | POA: Diagnosis not present

## 2020-12-14 DIAGNOSIS — G8114 Spastic hemiplegia affecting left nondominant side: Secondary | ICD-10-CM | POA: Diagnosis present

## 2020-12-14 DIAGNOSIS — Z86718 Personal history of other venous thrombosis and embolism: Secondary | ICD-10-CM

## 2020-12-14 DIAGNOSIS — R1310 Dysphagia, unspecified: Secondary | ICD-10-CM

## 2020-12-14 DIAGNOSIS — R569 Unspecified convulsions: Secondary | ICD-10-CM

## 2020-12-14 DIAGNOSIS — R414 Neurologic neglect syndrome: Secondary | ICD-10-CM | POA: Diagnosis not present

## 2020-12-14 DIAGNOSIS — N1831 Chronic kidney disease, stage 3a: Secondary | ICD-10-CM | POA: Diagnosis present

## 2020-12-14 DIAGNOSIS — I5032 Chronic diastolic (congestive) heart failure: Secondary | ICD-10-CM | POA: Diagnosis present

## 2020-12-14 DIAGNOSIS — R911 Solitary pulmonary nodule: Secondary | ICD-10-CM

## 2020-12-14 LAB — GLUCOSE, CAPILLARY
Glucose-Capillary: 130 mg/dL — ABNORMAL HIGH (ref 70–99)
Glucose-Capillary: 99 mg/dL (ref 70–99)
Glucose-Capillary: 130 mg/dL — ABNORMAL HIGH (ref 70–99)

## 2020-12-14 MED ORDER — DICLOFENAC SODIUM 1 % EX GEL: 2.0000 g | Freq: Four times a day (QID) | CUTANEOUS | Status: DC | PRN

## 2020-12-14 NOTE — Discharge Summary (Signed)
Physician Discharge Summary  Clifford [name]:[id number] DOB: [birth year] DOA: 10/14/2020  PCP: Pcp, No  Admit date: 10/14/2020 Discharge date: 12/14/2020  Time spent: 35 minutes  Recommendations for Outpatient Follow-up:  Patient will discharge to [place] facility in [facility] Patient will need to continue PT and OT services to help improve functional mobility.  It is unclear at this time given the degree of dense left hemiplegia if patient will be able to regain the ability to walk or even mobilize with a wheelchair. Continue LUE and LLE splints in combination with muscle relaxers to help decrease risk of development of contractures. Continue speech therapy in regards to linguistics but more so for swallowing.  Currently is on a chopped diet with thin liquid able oral intake.  Patient has a PEG tube available for feedings.  Consider transitioning to nocturnal feedings to see if this will improve patient's intake of food. Incidental finding of 8 mm left upper lobe nodule that will need additional follow-up with patient's PCP after discharge.  CODE STATUS: DNR  Discharge Diagnoses:  Principal Problem:   Acute stroke due to ischemia [facility]) Active Problems:   Essential hypertension   Mixed hyperlipidemia   Prediabetes   History of pulmonary embolism   Chronic anticoagulation   Nicotine dependence, cigarettes, uncomplicated   Petechial hemorrhage   Advanced care planning/counseling discussion   Goals of care, counseling/discussion   Palliative care by specialist   Left spastic hemiplegia ([place])   Left-sided neglect   Dysphagia   Nodule of upper lobe of left lung-8 mm   Seizure-like activity ([place])   Stage 3a chronic kidney disease (CKD) ([place])   Chronic diastolic congestive heart failure ([place])   History of recurrent deep vein thrombosis (DVT)    Discharge Condition: Stable  Diet recommendation: Heart healthy chopped fine with thin liquids.  Also on Jevity  1.5 tube feedings at 55 cc/h-consider changing to nocturnal tube feedings over 12 hours to help improve intake of oral diet.  Filed Weights   12/08/20 0500 12/09/20 0500 12/14/20 0400  Weight: 85.3 kg 85 kg 81.6 kg    History of present illness:  70 y.o. 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  He was managed with supportive care-hospital course was complicated by worsening somnolence-further imaging studies showed cytotoxic edema with some mass-effect on the right lateral ventricle.  Neurology continued to follow closely-he was managed with continued supportive care.  He continued to exhibit significant left-sided hemiplegia/dysphagia-ultimately required G-tube placement.  Plans are for SNF on discharge.   Pradaxa prescribed prior to this hospitalization but was held due to petechial hemorrhage/severe dysphagia.  Subsequently been transitioned to eliquis.  Lung nodule 8 mm left upper lobe which is stable and can be followed up by PCP after discharge.  On 9/27 received information from his [place] [facility] that he has had acute hepatitis from Eliquis in the past.  After discussion with pharmacist LFTs were followed closely without any evidence of reemergence of acute hepatitis.  At time of discharge patient has transition to a fine chopped diet with thin liquids but continues to have inconsistent oral intake therefore tube feedings have been continued.  He continues to make slow progress with OT and PT.  Because of ongoing issues with dysphagia  and pocketing of foods SLP has followed closely.  Patient had issues with severe nausea that has resolved with the addition of PPI and H2 blocker.  Hospital  Course:  Right MCA/PCA territory infarct with petechial hemorrhage/evolution-with mild edema/slight midline shift on repeat CT head on 8/16:  Sequelae of dense left hemiplegia and left-sided neglect-unable to ambulate at this time-continue offloading boots and LUE splint Continue Skelaxin along with HS Flexeril for spasticity Therapies document slow/incremental progress-patient's primary issue is dense left hemiplegia without any significant improvement in the symptoms.  Currently requires Hoyer lift to get up in chair and minimal use of RLE to help her self up in bed.  Be continuing to work on edge of bed balance as well of range of motion to neck and trunk.   Visual hallucinations Secondary to stroke physiology and now resolved after initiation of bedtime Seroquel   Left knee pain 2/2 degenerative changes Continue scheduled Tylenol-LFTs normal on 10/2   Persistent nausea Resolved Continue Protonix and Pepcid   Possible focal seizure:  Continue Keppra/Topamax.   Headache:  Stable on Topamax.  Probably related to CVA/recent cytotoxic edema.   Fever on 9/10:  Resolved-cultures negative.   Completed 5-day course of cefepime.   HTN:  continue carvedilol/amlodipine/hydralazine prn   HLD:  Continue Lipitor   AKI on CKD stage IIIa:  AKI likely hemodynamically mediated-resolved-creatinine back to baseline of 0.9-3.2   Rate 1 diastolic dysfunction HFpEF:  Euvolemic on exam. 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   Dysphagia Nutrition Problem: Inadequate oral intake noting oral intake remains marginal therefore continuing nocturnal tube feedings Etiology: dysphagia Signs/Symptoms: SLP recommending D1  diet Interventions: Tube feeding, Prostat   Obesity: Estimated body mass index is 30.12 kg/m as calculated from the following:   Height as of this encounter: 5\' 5"  (1.651 m).   Weight as of this encounter: 82.1 kg.     Procedures: Cortrack tube EEG PEG tube  Consultations: Neurology Interventional radiology  Antibiotics: Cefepime 9/10 through 9/14 Vancomycin dose on 9/10    Discharge Exam: Vitals:   12/14/20 0317 12/14/20 0838  BP: 139/69 (!) 148/76  Pulse: 68 74  Resp: 16 16  Temp: 98.8 F (37.1 C) 98.5 F (36.9 C)  SpO2: 100% 100%   Constitutional: NAD, calm, happy to discharge to rehabilitation facility and told me "thank you" Respiratory: Bilateral lung sounds clear to auscultation, room air, pulse oximetry 98% Cardiac: S1-S2, regular pulse, no lower extremity edema, skin warm and dry Abdomen:  LBM 10/10-PEG tube site unremarkable-remain otherwise soft nontender nondistended-continues with inconsistent oral intake Neurologic: CN 2-12 intact except for documented left visual field deficit with right gaze preference as well as left facial droop.  Continued left side neglect with associated hemiplegia and spasticity.  RUE in splint.  Right foot with foot drop. Extremities: Offloading boots on both legs.  Soft splint on LUE.  Psychiatric: Alert and oriented x3-withdrawn affect  Discharge Instructions   Discharge Instructions     Diet general   Complete by: As directed    Fine chopped diet with thin liquids.  Also on continuous tube feedings of Jevity 1.5 at 55 cc/h   Discharge wound care:   Complete by: As directed    Single layer of Xeroform  applied to penile lesions and cover with dry dressing daily   Increase activity slowly   Complete by: As directed       Allergies as of 12/14/2020       Reactions   Apixaban    Other reaction(s): Liver enzymes abnormal   Lisinopril    Other reaction(s): Cough (finding)   Simvastatin    Other reaction(s): Pain in  lower limb (finding)        Medication List     STOP taking these medications    finasteride 5 MG tablet Commonly known as: PROSCAR   hydrochlorothiazide 25 MG tablet Commonly known as: HYDRODIURIL   losartan 50 MG tablet Commonly known as: COZAAR   potassium chloride SA 20 MEQ tablet Commonly known as: KLOR-CON   Pradaxa 150 MG Caps capsule Generic drug: dabigatran   psyllium 58.6 % packet Commonly known as: METAMUCIL       TAKE these medications    acetaminophen 160 MG/5ML solution Commonly known as: TYLENOL Place 20.3 mLs (650 mg total) into feeding tube every 6 (six) hours.   apixaban 5 MG Tabs tablet Commonly known as: ELIQUIS Place 1 tablet (5 mg total) into feeding tube 2 (two) times daily.   atorvastatin 40 MG tablet Commonly known as: LIPITOR Place 1 tablet (40 mg total) into feeding tube daily. What changed:  medication strength how to take this when to take this   carvedilol 12.5 MG tablet Commonly known as: COREG Place 1 tablet (12.5 mg total) into feeding tube 2 (two) times daily with a meal.   cyclobenzaprine 5 MG tablet Commonly known as: FLEXERIL Place 1 tablet (5 mg total) into feeding tube at bedtime.   diclofenac Sodium 1 % Gel Commonly known as: VOLTAREN Apply 2 g topically 4 (four) times daily as needed (4x Daily as needed for pain).   famotidine 40 MG/5ML suspension Commonly known as: PEPCID Place 2.5 mLs (20 mg total) into feeding tube 2 (two) times daily.   feeding supplement (PROSource TF) liquid Place 45 mLs into feeding tube 2 (two) times daily.   feeding supplement (JEVITY 1.5 CAL/FIBER) Liqd Place 660 mLs into feeding tube daily.   feeding supplement Liqd Take 237 mLs by mouth 2 (two) times daily between meals.   free water Soln Place 200 mLs into feeding tube every 4 (four) hours.   insulin aspart 100 UNIT/ML injection Commonly known as: novoLOG Inject 0-9 Units into the skin every 4 (four) hours. Do NOT hold  insulin if patient is NPO. Sensitive scale.   levETIRAcetam 100 MG/ML solution Commonly known as: KEPPRA Place 2.5 mLs (250 mg total) into feeding tube 2 (two) times daily.   metaxalone 800 MG tablet Commonly known as: SKELAXIN Place 1 tablet (800 mg total) into feeding tube 3 (three) times daily.   modafinil 100 MG tablet Commonly known as: PROVIGIL Place 1 tablet (100 mg total) into feeding tube daily.   pantoprazole sodium 40 mg Commonly known as: PROTONIX Place 40 mg into feeding tube 2 (two) times daily.   polyethylene glycol 17 g packet Commonly known as: MIRALAX / GLYCOLAX Place 17 g into feeding tube daily as needed for mild constipation.   QUEtiapine 25 MG tablet Commonly known as: SEROQUEL Place 1 tablet (25 mg total) into feeding tube at bedtime.   saccharomyces boulardii 250 MG capsule Commonly known as: FLORASTOR Place 1 capsule (250 mg total) into feeding tube 2 (two) times daily.   topiramate 50 MG tablet Commonly known as:  TOPAMAX Place 1 tablet (50 mg total) into feeding tube 2 (two) times daily.               Discharge Care Instructions  (From admission, onward)           Start     Ordered   12/14/20 0000  Discharge wound care:       Comments: Single layer of Xeroform applied to penile lesions and cover with dry dressing daily   12/14/20 1004           Allergies  Allergen Reactions   Apixaban     Other reaction(s): Liver enzymes abnormal   Lisinopril     Other reaction(s): Cough (finding)   Simvastatin     Other reaction(s): Pain in lower limb (finding)      The results of significant diagnostics from this hospitalization (including imaging, microbiology, ancillary and laboratory) are listed below for reference.    Significant Diagnostic Studies: DG Wrist Complete Left  Result Date: 12/02/2020 CLINICAL DATA:  Left wrist pain EXAM: LEFT WRIST - COMPLETE 3+ VIEW COMPARISON:  None. FINDINGS: No fracture or malalignment. Joint  spaces are patent. Mild vascular calcification. Generalized soft tissue swelling IMPRESSION: No acute osseous abnormality Electronically Signed   By: Donavan Foil M.D.   On: 12/02/2020 20:39   DG Knee 1-2 Views Left  Result Date: 12/09/2020 CLINICAL DATA:  Left knee pain, no known injury, initial encounter EXAM: LEFT KNEE - 2 VIEW COMPARISON:  None. FINDINGS: Mild medial joint space narrowing is noted. No acute fracture or dislocation is seen. No joint effusion is noted. Vascular calcifications are seen. IMPRESSION: Mild degenerative change without acute abnormality. Electronically Signed   By: Inez Catalina M.D.   On: 12/09/2020 20:15   CT HEAD WO CONTRAST (5MM)  Result Date: 11/16/2020 CLINICAL DATA:  Mental status change, unknown cause EXAM: CT HEAD WITHOUT CONTRAST TECHNIQUE: Contiguous axial images were obtained from the base of the skull through the vertex without intravenous contrast. COMPARISON:  11/05/2020. FINDINGS: Mild to moderately motion limited study. Brain: Similar evolving right posterior MCA territory infarct with associated edema and loss of gray-white differentiation. Similar scattered curvilinear hyperdensity within the infarct, compatible with petechial hemorrhage. Similar leftward midline shift with approximately 6 mm at the foramen of [place]. Similar effacement of the right lateral ventricle. No progressive ventriculomegaly. No evidence of new/interval acute large vascular territorial infarct. No evidence of progressive hemorrhage. No visible extra-axial fluid collection. Basal cisterns are patent. Vascular: Calcific intracranial atherosclerosis. Skull: No acute fracture. Sinuses/Orbits: Mild paranasal sinus mucosal thickening. No acute orbital findings. Other: No mastoid effusions. IMPRESSION: Similar large evolving right posterior MCA territory infarct with similar 6 mm leftward midline shift and petechial hemorrhage. Electronically Signed   By: Margaretha Sheffield M.D.   On:  11/16/2020 12:43   DG Chest Port 1 View  Result Date: 12/01/2020 CLINICAL DATA:  70 year old male with history of shortness of breath. EXAM: PORTABLE CHEST 1 VIEW COMPARISON:  Chest x-ray 11/29/2020. FINDINGS: Lung volumes are low. Linear densities projecting over the lower left hemithorax, favored to be artifactual exterior to the patient (less likely to represent areas of subsegmental atelectasis). No consolidative airspace disease. No pleural effusions. No pneumothorax. No pulmonary nodule or mass noted. Pulmonary vasculature and the cardiomediastinal silhouette are within normal limits. Atherosclerosis in the thoracic aorta. IMPRESSION: 1. Low lung volumes without radiographic evidence of acute cardiopulmonary disease. 2. Aortic atherosclerosis. Electronically Signed   By: Vinnie Langton M.D.   On:  12/01/2020 09:31   DG Chest Port 1 View  Result Date: 11/29/2020 CLINICAL DATA:  Shortness of breath. EXAM: PORTABLE CHEST 1 VIEW COMPARISON:  11/21/2020 FINDINGS: The cardiomediastinal silhouette is unchanged with normal heart size. No airspace consolidation, edema, sizable pleural effusion, or pneumothorax is identified. IMPRESSION: No active disease. Electronically Signed   By: Logan Bores M.D.   On: 11/29/2020 06:22   DG CHEST PORT 1 VIEW  Result Date: 11/21/2020 CLINICAL DATA:  Cough. EXAM: PORTABLE CHEST 1 VIEW COMPARISON:  November 13, 2020 FINDINGS: The heart size and mediastinal contours are within normal limits. Both lungs are clear. The visualized skeletal structures are unremarkable. IMPRESSION: No active disease. Electronically Signed   By: Dorise Bullion III M.D.   On: 11/21/2020 15:58   DG Swallowing Func-Speech Pathology  Result Date: 11/22/2020 Table formatting from the original result was not included. Objective Swallowing Evaluation: Type of Study: MBS-Modified Barium Swallow Study  Patient Details Name: NITIN MCKOWEN MRN: [id number] Date of Birth: 1950/09/25 Today's Date: 11/22/2020  Time: SLP Start Time (ACUTE ONLY): 0910 -SLP Stop Time (ACUTE ONLY): 0930 SLP Time Calculation (min) (ACUTE ONLY): 20 min Past Medical History: Past Medical History: Diagnosis Date  BPH (benign prostatic hyperplasia)   Chronic anticoagulation 10/14/2020  Essential hypertension 10/06/2020  History of pulmonary embolism 10/14/2020  HLD (hyperlipidemia)   HTN (hypertension)   Lung nodule   46mm in 2015  Malignant neoplasm of left kidney ([place])   Mixed hyperlipidemia 10/06/2020  Nicotine dependence, cigarettes, uncomplicated 11/04/5174  Peripheral vascular disease ([place])   Polyp of colon   Prediabetes 10/14/2020  Pulmonary embolus ([place])   Stroke ([place]) 10/06/2020 Past Surgical History: Past Surgical History: Procedure Laterality Date  COLONOSCOPY  03/2017  IR GASTROSTOMY TUBE MOD SED  11/02/2020  PARTIAL NEPHRECTOMY Left 02/2018  PROSTATE BIOPSY  2015 HPI: 70yo male admitted 10/14/20 with right side headache and LUE weakness. 10/19/20 pt was noted to have increased LUE weakness. 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 CTHead = evolving RMCA infarct with cytotoxic edema, mass effect  Subjective: Pt seen in radiology for MBS. Left anterior leakage of oral secretions observed prior to PO presentations. Assessment / Plan / Recommendation CHL IP CLINICAL IMPRESSIONS 11/22/2020 Clinical Impression Pt continues to present with significant cognitive deficits, which adversely impact the functionality of PO intake. Pt accepted trials of nectar thick liquid, thin liquid, and puree via teaspoon and straw (liquids). Pt presents with moderate oral dysphagia, characterized by left anterior leakage, extended oral holding, poor bolus formation and posterior propulsion. Trace residue is noted throughout the oral cavity after the swallow. Pt required multiple cues to propel bolus posteriorly to be swallowed. Pharyngeal swallow is mildly impaired, and is characterized by premature  spillage of all consistencies over the tongue base to the vallecular and then pyriform sinuses prior to initiation of the swallow reflex. No penetration or aspiration was seen on any texture, and no post-swallow residue was noted on any consistency. While oropharyngeal swallow is safe for PO intake, his cognitive impairment makes safe, effective and functional PO intake unlikely at this point. Recommend continuing PEG tube feeds for primary nutrition, hydration, and medications. Pt may have individual small boluses of ice chips, water, and puree during therapy and with close 1:1 supervision for pleasure. SLP will continue to follow acutely to assess tolerance of PO trials, and determine readiness to advance.  SLP Visit Diagnosis Dysphagia, oropharyngeal phase (R13.12)  Impact on safety and function Risk for inadequate nutrition/hydration;Moderate aspiration risk;Mild aspiration risk   CHL IP TREATMENT RECOMMENDATION 11/22/2020 Treatment Recommendations Therapy as outlined in treatment plan below   Prognosis 11/22/2020 Prognosis for Safe Diet Advancement Fair Barriers to Reach Goals Cognitive deficits;Severity of deficits CHL IP DIET RECOMMENDATION 11/22/2020 SLP Diet Recommendations Alternative means - long-term  Other PO intake: Individual boluses of ice chips, thin liquid, and puree, given with 1:1 close supervision  Medication Administration Via alternative means Compensations Minimize environmental distractions;Slow rate;Small sips/bites;Monitor for anterior loss Postural Changes Seated upright at 90 degrees   CHL IP OTHER RECOMMENDATIONS 11/22/2020   Oral Care Recommendations Oral care before and after PO  Other Recommendations Have oral suction available   CHL IP FOLLOW UP RECOMMENDATIONS 11/22/2020 Follow up Recommendations Skilled Nursing facility;24 hour supervision/assistance   CHL IP FREQUENCY AND DURATION 11/22/2020 Speech Therapy Frequency (ACUTE ONLY) min 2x/week Treatment Duration 2 weeks      CHL IP  ORAL PHASE 11/22/2020 Oral Phase Impaired  Oral - Nectar Teaspoon, Nectar cup, Thin Straw, Puree Left anterior bolus loss Weak lingual manipulation Lingual pumping Reduced posterior propulsion Holding of bolus Pocketing in anterior sulcus Lingual/palatal residue Delayed oral transit Premature spillage Decreased bolus cohesion   CHL IP PHARYNGEAL PHASE 11/22/2020 Pharyngeal Phase Impaired  Pharyngeal- Nectar Teaspoon Delayed swallow initiation-pyriform sinuses   CHL IP CERVICAL ESOPHAGEAL PHASE 11/22/2020 Cervical Esophageal Phase WFL Celia B. Quentin Ore, [place], [place] Speech Language Pathologist Office: [phone] Shonna Chock 11/22/2020, 9:45 AM                Labs: Basic Metabolic Panel: Recent Labs  Lab 12/11/20 0242  NA 137  K 3.7  CL 107  CO2 22  GLUCOSE 97  BUN 30*  CREATININE 1.30*  CALCIUM 8.9   Liver Function Tests: Recent Labs  Lab 12/11/20 0242  AST 25  ALT 29  ALKPHOS 82  BILITOT 0.7  PROT 6.4*  ALBUMIN 2.7*   CBC: Recent Labs  Lab 12/11/20 0242  WBC 6.7  NEUTROABS 4.0  HGB 10.2*  HCT 32.4*  MCV 83.1  PLT 161   BNP (last 3 results) Recent Labs    12/01/20 0900  BNP 86.7     CBG: Recent Labs  Lab 12/13/20 1646 12/13/20 1948 12/13/20 2345 12/14/20 0403 12/14/20 0838  GLUCAP 127* 132* 131* 130* 99       Signed:  Erin Hearing ANP [facility] 12/14/2020, 10:07 AM

## 2020-12-14 NOTE — TOC Transition Note (Addendum)
Transition of Care [facility]) - CM/SW Discharge Note   Patient Details  Name: Clifford [name] MRN: [id number] Date of Birth: 06/27/1950  [facility]) CM/SW Contact:  Curlene Labrum, RN Phone Number: 12/14/2020, 9:06 AM   Clinical Narrative:    Case management called and spoke with the patient's brother, Clifford [name], this morning and confirmed that the patient would transfer to [place] by PTAR around 1100 am this morning.  The patient is aware as well.  CM updated the FL2 this morning and uploaded it so that PASRR could be reviewed and approved.  I called and left a message with Clifford [name], CM at [facility] and updated her that patient would transfer to the facility this afternoon by PTAR once discharge orders and summary were placed.  Bedside Nursing to call report to [facility] and [place] facility at [phone].  PTAR called and transportation arranged for 1100 am this morning.    CM and MSW with DTP will continue to follow the patient for [facility] placement.  12/14/2020 1344 - CM called and spoke with PTAR and the patient was discharged from the hospital and should be arriving at [facility].   Final next level of care: Skilled Nursing Facility Barriers to Discharge: SNF Pending bed offer   Patient Goals and CMS Choice Patient states their goals for this hospitalization and ongoing recovery are:: Family would like patient placed in LTC facility. CMS Medicare.gov Compare Post Acute Care list provided to:: Patient Represenative (must [name]) (family contacts) Choice offered to / list presented to : Sibling  Discharge Placement                       Discharge Plan and Services In-house Referral: Clinical Social Work, Conservation officer, nature Services: CM Consult Post Acute Care Choice: [place]                                Social Determinants of Health (SDOH) Interventions     Readmission Risk Interventions Readmission Risk Prevention Plan 12/14/2020  Transportation Screening Complete  PCP or Specialist Appt within 3-5 Days Complete  HRI or [place] Complete  Social Work Consult for [place] Planning/Counseling Complete  Palliative Care Screening Complete  Medication Review Press photographer) Complete

## 2020-12-14 NOTE — Plan of Care (Signed)
  Problem: Education: Goal: Knowledge of General Education information will improve Description: Including pain rating scale, medication(s)/side effects and non-pharmacologic comfort measures Outcome: Adequate for Discharge   Problem: Health Behavior/Discharge Planning: Goal: Ability to manage health-related needs will improve Outcome: Adequate for Discharge   Problem: Clinical Measurements: Goal: Ability to maintain clinical measurements within normal limits will improve Outcome: Adequate for Discharge Goal: Will remain free from infection Outcome: Adequate for Discharge Goal: Diagnostic test results will improve Outcome: Adequate for Discharge Goal: Respiratory complications will improve Outcome: Adequate for Discharge Goal: Cardiovascular complication will be avoided Outcome: Adequate for Discharge   Problem: Activity: Goal: Risk for activity intolerance will decrease Outcome: Adequate for Discharge   Problem: Nutrition: Goal: Adequate nutrition will be maintained Outcome: Adequate for Discharge   Problem: Coping: Goal: Level of anxiety will decrease Outcome: Adequate for Discharge   Problem: Elimination: Goal: Will not experience complications related to bowel motility Outcome: Adequate for Discharge Goal: Will not experience complications related to urinary retention Outcome: Adequate for Discharge   Problem: Pain Managment: Goal: General experience of comfort will improve Outcome: Adequate for Discharge   Problem: Safety: Goal: Ability to remain free from injury will improve Outcome: Adequate for Discharge   Problem: Skin Integrity: Goal: Risk for impaired skin integrity will decrease Outcome: Adequate for Discharge   Problem: Education: Goal: Knowledge of disease or condition will improve Outcome: Adequate for Discharge Goal: Knowledge of secondary prevention will improve Outcome: Adequate for Discharge Goal: Knowledge of patient specific risk factors  addressed and post discharge goals established will improve Outcome: Adequate for Discharge Goal: Individualized Educational Video(s) Outcome: Adequate for Discharge   Problem: Coping: Goal: Will verbalize positive feelings about self Outcome: Adequate for Discharge Goal: Will identify appropriate support needs Outcome: Adequate for Discharge   Problem: Health Behavior/Discharge Planning: Goal: Ability to manage health-related needs will improve Outcome: Adequate for Discharge   Problem: Self-Care: Goal: Ability to participate in self-care as condition permits will improve Outcome: Adequate for Discharge Goal: Verbalization of feelings and concerns over difficulty with self-care will improve Outcome: Adequate for Discharge Goal: Ability to communicate needs accurately will improve Outcome: Adequate for Discharge   Problem: Nutrition: Goal: Risk of aspiration will decrease Outcome: Adequate for Discharge Goal: Dietary intake will improve Outcome: Adequate for Discharge   Problem: Intracerebral Hemorrhage Tissue Perfusion: Goal: Complications of Intracerebral Hemorrhage will be minimized Outcome: Adequate for Discharge   Problem: Ischemic Stroke/TIA Tissue Perfusion: Goal: Complications of ischemic stroke/TIA will be minimized Outcome: Adequate for Discharge   Problem: Spontaneous Subarachnoid Hemorrhage Tissue Perfusion: Goal: Complications of Spontaneous Subarachnoid Hemorrhage will be minimized Outcome: Adequate for Discharge   

## 2021-01-25 ENCOUNTER — Inpatient Hospital Stay: Payer: Medicare Other | Admitting: Adult Health

## 2021-01-25 NOTE — Progress Notes (Deleted)
[facility] [address] [place]. [place] [address] [phone]       HOSPITAL FOLLOW UP NOTE  [name] Date of Birth:  June 22, 1950 Medical Record Number:  [id number]   Reason for Referral:  hospital stroke follow up    SUBJECTIVE:   CHIEF COMPLAINT:  No chief complaint on file.   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   Patient had a stroke in 10/06/2020 with vision changes and left-sided weakness.  CT and MRI showed right MCA/PCA watershed infarct.  CT head and neck showed right ICA siphon severe stenosis LDL 75, A1c 5.9.  He was on Coumadin for PE PTA, changed to Pradaxa and also discharged with Lipitor 40.  Returned on 10/14/2020 for increased left hand weakness, headache and blurred vision. Eval by Dr. Erlinda Hong. CT and MRI showed expected evolution of right posterior MCA infarct with petechial hemorrhage and mild edema.  Repeat MRI brain 8/25 showed increasing midline shift with cytotoxic edema and petechial hemorrhage.  Pradaxa placed on hold and subsequently transition to Eliquis prior to discharge.  LUE non suppressible twitching/weakness concern for focal seizure - LTM right hemispheric slowing but no definite epileptiform activity -Keppra initiated.  Hospital course complicated by visual hallucinations (resolved on Seroquel), left knee pain, persistent nausea, and headache (stable on Topamax).  Incidental finding on 8 mm LUL lung nodule-advised follow-up with PCP.  Residual left hemiplegia and dysphagia requiring placement of G-tube - d/c'd to SNF on 12/14/2020.          PERTINENT IMAGING      ROS:   14 system review of systems performed and negative with exception of ***  PMH:  Past Medical History:  Diagnosis Date   BPH (benign prostatic hyperplasia)    Chronic anticoagulation 10/14/2020   Essential hypertension 10/06/2020   History of  pulmonary embolism 10/14/2020   HLD (hyperlipidemia)    HTN (hypertension)    Lung nodule    57mm in 2015   Malignant neoplasm of left kidney (HCC)    Mixed hyperlipidemia 10/06/2020   Nicotine dependence, cigarettes, uncomplicated 0/93/2671   Peripheral vascular disease ([place])    Polyp of colon    Prediabetes 10/14/2020   Pulmonary embolus ([place])    Stroke ([place]) 10/06/2020    PSH:  Past Surgical History:  Procedure Laterality Date   COLONOSCOPY  03/2017   IR GASTROSTOMY TUBE MOD SED  11/02/2020   PARTIAL NEPHRECTOMY Left 02/2018   PROSTATE BIOPSY  2015    Social History:  Social History   Socioeconomic History   Marital status: Single    Spouse name: Not on file   Number of children: Not on file   Years of education: Not on file   Highest education level: Not on file  Occupational History   Not on file  Tobacco Use   Smoking status: Every Day    Packs/day: 0.50    Types: Cigarettes   Smokeless tobacco: Never  Vaping Use   Vaping Use: Never used  Substance and Sexual Activity   Alcohol use: Never   Drug use: Never   Sexual activity: Not on file  Other Topics Concern   Not on file  Social History Narrative   Not on file   Social Determinants of Health   Financial Resource Strain: Not on file  Food Insecurity: Not on file  Transportation Needs: Not on file  Physical  Activity: Not on file  Stress: Not on file  Social Connections: Not on file  Intimate Partner Violence: Not on file    Family History:  Family History  Problem Relation Age of Onset   Clotting disorder Neg Hx     Medications:   Current Outpatient Medications on File Prior to Visit  Medication Sig Dispense Refill   acetaminophen (TYLENOL) 160 MG/5ML solution Place 20.3 mLs (650 mg total) into feeding tube every 6 (six) hours. 120 mL 0   apixaban (ELIQUIS) 5 MG TABS tablet Place 1 tablet (5 mg total) into feeding tube 2 (two) times daily. 60 tablet    atorvastatin (LIPITOR) 40 MG tablet Place 1  tablet (40 mg total) into feeding tube daily.     carvedilol (COREG) 12.5 MG tablet Place 1 tablet (12.5 mg total) into feeding tube 2 (two) times daily with a meal.     cyclobenzaprine (FLEXERIL) 5 MG tablet Place 1 tablet (5 mg total) into feeding tube at bedtime. 30 tablet 0   diclofenac Sodium (VOLTAREN) 1 % GEL Apply 2 g topically 4 (four) times daily as needed (4x Daily as needed for pain).     famotidine (PEPCID) 40 MG/5ML suspension Place 2.5 mLs (20 mg total) into feeding tube 2 (two) times daily. 50 mL 0   feeding supplement (ENSURE ENLIVE / ENSURE PLUS) LIQD Take 237 mLs by mouth 2 (two) times daily between meals. 237 mL 12   insulin aspart (NOVOLOG) 100 UNIT/ML injection Inject 0-9 Units into the skin every 4 (four) hours. Do NOT hold insulin if patient is NPO. Sensitive scale. 10 mL 11   levETIRAcetam (KEPPRA) 100 MG/ML solution Place 2.5 mLs (250 mg total) into feeding tube 2 (two) times daily. 473 mL 12   metaxalone (SKELAXIN) 800 MG tablet Place 1 tablet (800 mg total) into feeding tube 3 (three) times daily.     modafinil (PROVIGIL) 100 MG tablet Place 1 tablet (100 mg total) into feeding tube daily.     Nutritional Supplements (FEEDING SUPPLEMENT, JEVITY 1.5 CAL/FIBER,) LIQD Place 660 mLs into feeding tube daily.     Nutritional Supplements (FEEDING SUPPLEMENT, PROSOURCE TF,) liquid Place 45 mLs into feeding tube 2 (two) times daily.     pantoprazole sodium (PROTONIX) 40 mg Place 40 mg into feeding tube 2 (two) times daily.     polyethylene glycol (MIRALAX / GLYCOLAX) 17 g packet Place 17 g into feeding tube daily as needed for mild constipation. 14 each 0   QUEtiapine (SEROQUEL) 25 MG tablet Place 1 tablet (25 mg total) into feeding tube at bedtime.     saccharomyces boulardii (FLORASTOR) 250 MG capsule Place 1 capsule (250 mg total) into feeding tube 2 (two) times daily.     topiramate (TOPAMAX) 50 MG tablet Place 1 tablet (50 mg total) into feeding tube 2 (two) times daily.      Water For Irrigation, Sterile (FREE WATER) SOLN Place 200 mLs into feeding tube every 4 (four) hours.     No current facility-administered medications on file prior to visit.    Allergies:   Allergies  Allergen Reactions   Apixaban     Other reaction(s): Liver enzymes abnormal   Lisinopril     Other reaction(s): Cough (finding)   Simvastatin     Other reaction(s): Pain in lower limb (finding)      OBJECTIVE:  Physical Exam  There were no vitals filed for this visit. There is no height or weight on file to calculate  BMI. No results found.  No flowsheet data found.   General: well developed, well nourished, seated, in no evident distress Head: head normocephalic and atraumatic.   Neck: supple with no carotid or supraclavicular bruits Cardiovascular: regular rate and rhythm, no murmurs Musculoskeletal: no deformity Skin:  no rash/petichiae Vascular:  Normal pulses all extremities   Neurologic Exam Mental Status: Awake and fully alert. Oriented to place and time. Recent and remote memory intact. Attention span, concentration and fund of knowledge appropriate. Mood and affect appropriate.  Cranial Nerves: Fundoscopic exam reveals sharp disc margins. Pupils equal, briskly reactive to light. Extraocular movements full without nystagmus. Visual fields full to confrontation. Hearing intact. Facial sensation intact. Face, tongue, palate moves normally and symmetrically.  Motor: Normal bulk and tone. Normal strength in all tested extremity muscles Sensory.: intact to touch , pinprick , position and vibratory sensation.  Coordination: Rapid alternating movements normal in all extremities. Finger-to-nose and heel-to-shin performed accurately bilaterally. Gait and Station: Arises from chair without difficulty. Stance is normal. Gait demonstrates normal stride length and balance with ***. Tandem walk and heel toe ***.  Reflexes: 1+ and symmetric. Toes downgoing.     NIHSS   *** Modified Rankin  ***      ASSESSMENT: Clifford [name] is a 70 y.o. year old male ***. Vascular risk factors include ***.      PLAN:  *** : Residual deficit: ***. Continue {anticoagulants:31417}  and ***  for secondary stroke prevention.  Discussed secondary stroke prevention measures and importance of close PCP follow up for aggressive stroke risk factor management. I have gone over the pathophysiology of stroke, warning signs and symptoms, risk factors and their management in some detail with instructions to go to the closest emergency room for symptoms of concern. HTN: BP goal <130/90.  Stable on *** per PCP HLD: LDL goal <70. Recent LDL ***.  DMII: A1c goal<7.0. Recent A1c ***.     Follow up in *** or call earlier if needed   CC:  [facility] provider: Dr. Leonie Man PCP: Pcp, No    I spent *** minutes of face-to-face and non-face-to-face time with patient.  This included previsit chart review including review of recent hospitalization, lab review, study review, order entry, electronic health record documentation, patient education regarding recent stroke including etiology, secondary stroke prevention measures and importance of managing stroke risk factors, residual deficits and typical recovery time and answered all other questions to patient satisfaction   Frann Rider, AGNP-BC  [facility] [address] [place], [place] [address]  Phone [phone] Fax [phone] Note: This document was prepared with digital dictation and possible smart phrase technology. Any transcriptional errors that result from this process are unintentional.

## 2021-03-03 ENCOUNTER — Encounter: Payer: Self-pay | Admitting: Adult Health

## 2021-03-03 ENCOUNTER — Ambulatory Visit (INDEPENDENT_AMBULATORY_CARE_PROVIDER_SITE_OTHER): Payer: Medicaid Other | Admitting: Adult Health

## 2021-03-03 VITALS — BP 171/99 | HR 80

## 2021-03-03 DIAGNOSIS — R569 Unspecified convulsions: Secondary | ICD-10-CM | POA: Diagnosis not present

## 2021-03-03 DIAGNOSIS — G441 Vascular headache, not elsewhere classified: Secondary | ICD-10-CM

## 2021-03-03 DIAGNOSIS — I69398 Other sequelae of cerebral infarction: Secondary | ICD-10-CM

## 2021-03-03 DIAGNOSIS — I63411 Cerebral infarction due to embolism of right middle cerebral artery: Secondary | ICD-10-CM | POA: Diagnosis not present

## 2021-03-03 NOTE — Patient Instructions (Addendum)
Continue Eliquis (apixaban) daily  and atorvastatin 40mg  daily  for secondary stroke prevention  Continue keppra and topamax for now   Continue to follow up with PCP regarding cholesterol, blood pressure and diabetes management  Maintain strict control of hypertension with blood pressure goal below 130/90, diabetes with hemoglobin A1c goal below 7.0 % and cholesterol with LDL cholesterol (bad cholesterol) goal below 70 mg/dL.   Signs of a Stroke? Follow the BEFAST method:  Balance Watch for a sudden loss of balance, trouble with coordination or vertigo Eyes Is there a sudden loss of vision in one or both eyes? Or double vision?  Face: Ask the person to smile. Does one side of the face droop or is it numb?  Arms: Ask the person to raise both arms. Does one arm drift downward? Is there weakness or numbness of a leg? Speech: Ask the person to repeat a simple phrase. Does the speech sound slurred/strange? Is the person confused ? Time: If you observe any of these signs, call 911.     Followup in the future with me in 4 months or call earlier if needed       Thank you for coming to see [place] at [facility]. I hope we have been able to provide you high quality care today.  You may receive a patient satisfaction survey over the next few weeks. We would appreciate your feedback and comments so that we may continue to improve ourselves and the health of our patients.

## 2021-03-03 NOTE — Progress Notes (Signed)
[facility] [address] [place]. [place] [address] [phone]       HOSPITAL FOLLOW UP NOTE  Mr. LAVAUGHN HABERLE Date of Birth:  [birth year] Medical Record Number:  [id number]   Reason for Referral:  hospital stroke follow up    SUBJECTIVE:   CHIEF COMPLAINT:  Chief Complaint  Patient presents with   Follow-up    RM 2 with brother benjamin  Pt is well, brother states he is paralyzed on L side, cognition is getting better but he does hallucinating a lot. Pt is doing PT/OT/ST    HPI:   Mr. YOSHIAKI KREUSER is a 70 y.o. male w/pmh of hypertension, hyperlipidemia, pulmonary embolus on Coumadin who presented on 10/06/2020 with vision change and mild left-sided weakness that started 2 days prior.  Personally reviewed hospitalization pertinent progress notes, lab work and imaging.  Evaluated by Dr. Leonie Man for right MCA/PCA watershed stroke possibly from moderate right carotid siphon stenosis vs possible atrial fibrillation. CTA head/neck 50 to 70% stenosis right carotid siphon. EF 65 to 70%.  LDL 75 on atorvastatin 40 mg daily.  A1c 5.9. Further cardiac monitoring not indicated as patient already on warfarin for history of PE with subtherapeutic INR and continued difficulty regulating therefore recommend switching to either Pradaxa or Eliquis based on insurance coverage.  As patient visiting from [place], [place] follow up not set up and advised to follow-up with PCP and referral to neuro if necessary. D/c'd home 8/5  Returns on 10/14/2020 for worsening left upper extremity weakness, headache and visual deficit and found to have enlargement of his previous stroke with microhemorrhage.  Pradaxa placed on hold. Concern for focal seizure activity with LUE nonsuppressible twitching/weakness with LTM showing right hemispheric slowing but no definite epileptiform activity and placed on Keppra 500 mg twice daily.  Concerning neurological worsening on 8/25 with repeat MR brain showing  progressive swelling in right hemisphere, posterior lateral temporal and temporoparietal region with minimal blood products as well as small tiny acute infarcts anterior to the inferior basal ganglia new from prior MRI on 8/11 as well as 14 mm right-to-left midline shift.  Switched Lovenox to IV heparin and discontinue free water thru NG tube as felt possibly contributing to persistent edema.  Recommended restarting Pradaxa after PEG tube placement.  HTN stable.  Initially restart the Pradaxa eventually switched to Eliquis with close monitoring of LFTs due to prior issues when previously on Eliquis (per information received from his [place] [facility] clinic).  Significant residual deficits with left hemiplegia, dysphagia s/p PEG, dysarthria and visual hallucinations (resolved on Seroquel). C/o HA possibly related to stroke/cytotoxic edema placed on topiramate -therapy eval's recommended SNF requiring 24-hour care. D/c'd to [facility] on 12/14/2020   Today, 03/03/2021, patient being seen for initial hospital follow-up accompanied by his brother who provides history. Continued left hemiplegia - no improvement, nonambulatory, hoyerlift for transfers   Continue dysphagia - on pureed diet, attempted to upgrade but unable to tolerate, PEG feed qhs and meals during the day Continued cognitive impairment - improving per brother but still confusion Continued right gaze preference and left sided neglect Working with therapies per brother report Denies new stroke/TIA symptoms  Per MAR review, on topiramate -denies any residual headaches.   on Keppra - no seizure activity On Eliquis and atorvastatin -denies side effects  Blood pressure elevated -monitored at facility and typically 1 30-140s/70-80s  Brother questions his ability to travel back home to [place] - he was visiting his brother here in  [place] when his stroke occurred. Patients son is wanting him back home and patient also wishes to return back home.  Previously told not stable enough to travel.   No further concerns at this time      PERTINENT IMAGING  MR brain 10/28/2020 IMPRESSION: Progressive swelling in the right hemisphere infarction affecting the posterolateral temporal lobe, parieto-occipital junction and parietal region. Minimal petechial blood products, not increased since the previous study. No frank hematoma. Small acute infarctions anterior to that within the posterior frontal cortical and subcortical brain. Small acute infarctions at the inferior basal ganglia on the right, newly seen since the study of 10/14/2020. Increased swelling and mass effect with right-to-left shift of 14 mm and threatening uncal herniation.  10/14/2020 IMPRESSION: 1. Mild enlargement of the right MCA/border zone infarct since the 10/07/2020 MRI. Increased cytotoxic edema and petechial hemorrhage without midline shift. 2. Chronic small-vessel ischemia including an old hemorrhagic infarct in the left basal ganglia.  EEG: IMPRESSION: This study is suggestive of cortical dysfunction in right hemisphere, likely secondary to underlying stroke.  No seizures or epileptiform discharges were seen throughout the recording.    10/06/20 CT Head WO IV Contrast 6.3 x 3.3 x 5.8 cm acute/early subacute cortical and subcortical infarct within the right parietooccipital lobes (right PCA vascular territory and right MCA/PCA watershed territory). Consider MR or CT angiography for further evaluation.   Chronic left basal ganglia lacunar infarct. Paranasal sinus disease at the imaged levels, as described.   10/06/20 CT Angio Head and Neck W WO IV Contrast No intracranial large vessel occlusion identified at this time, with specific attention to the right MCA branches.   Minimal atherosclerotic change at both internal carotid artery bulbs. No stenosis.   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 No stenosis on the left. 50-70% stenosis in  carotid siphon on the right.   10/07/20 MRI Brain WO IV Contrast 6.2 x 6.4 cm acute/early subacute cortical and subcortical infarction within the right parietal and occipital lobes (at the junction of the right MCA and PCA territories). The infarct has not significantly changed in extent as compared to the head CT of 10/06/2020. Mild petechial hemorrhage within the infarction territory. No significant mass effect at this time.   Numerous additional small patchy cortical and subcortical acute/early subacute infarcts within the right frontal and parietal lobes (watershed distribution).   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   Background mild chronic small vessel ischemic changes within the cerebral white matter.   Paranasal sinus disease at the imaged levels, as described.   10/07/20 Echocardiogram Complete  IMPRESSIONS   1. Left ventricular ejection fraction, by estimation, is 65 to 70%. The  left ventricle has hyperdynamic function. The left ventricle has no  regional wall motion abnormalities. Left ventricular diastolic parameters  are consistent with Grade I diastolic  dysfunction (impaired relaxation).   2. Right ventricular systolic function is normal. The right ventricular  size is normal. Tricuspid regurgitation signal is inadequate for assessing  PA pressure.   3. The mitral valve is normal in structure. No evidence of mitral valve  regurgitation. No evidence of mitral stenosis.   4. The aortic valve is tricuspid. Aortic valve regurgitation is not  visualized. No aortic stenosis is present.   5. The inferior vena cava is normal in size with greater than 50%  respiratory variability, suggesting right atrial pressure of 3 mmHg.       ROS:   N/A  d/t cognitive impairment  PMH:  Past Medical History:  Diagnosis Date   BPH (benign prostatic hyperplasia)    Chronic anticoagulation 10/14/2020   Essential  hypertension 10/06/2020   History of pulmonary embolism 10/14/2020   HLD (hyperlipidemia)    HTN (hypertension)    Lung nodule    56mm in 2015   Malignant neoplasm of left kidney (HCC)    Mixed hyperlipidemia 10/06/2020   Nicotine dependence, cigarettes, uncomplicated 1/82/9937   Peripheral vascular disease ([place])    Polyp of colon    Prediabetes 10/14/2020   Pulmonary embolus ([place])    Stroke ([place]) 10/06/2020    PSH:  Past Surgical History:  Procedure Laterality Date   COLONOSCOPY  03/2017   IR GASTROSTOMY TUBE MOD SED  11/02/2020   PARTIAL NEPHRECTOMY Left 02/2018   PROSTATE BIOPSY  2015    Social History:  Social History   Socioeconomic History   Marital status: Single    Spouse name: Not on file   Number of children: Not on file   Years of education: Not on file   Highest education level: Not on file  Occupational History   Not on file  Tobacco Use   Smoking status: Every Day    Packs/day: 0.50    Types: Cigarettes   Smokeless tobacco: Never  Vaping Use   Vaping Use: Never used  Substance and Sexual Activity   Alcohol use: Never   Drug use: Never   Sexual activity: Not on file  Other Topics Concern   Not on file  Social History Narrative   Not on file   Social Determinants of Health   Financial Resource Strain: Not on file  Food Insecurity: Not on file  Transportation Needs: Not on file  Physical Activity: Not on file  Stress: Not on file  Social Connections: Not on file  Intimate Partner Violence: Not on file    Family History:  Family History  Problem Relation Age of Onset   Clotting disorder Neg Hx     Medications:   Current Outpatient Medications on File Prior to Visit  Medication Sig Dispense Refill   acetaminophen (TYLENOL) 160 MG/5ML solution Place 20.3 mLs (650 mg total) into feeding tube every 6 (six) hours. 120 mL 0   amLODipine (NORVASC) 5 MG tablet Take 5 mg by mouth daily. 1.5 tab daily     apixaban (ELIQUIS) 5 MG TABS tablet Place 1  tablet (5 mg total) into feeding tube 2 (two) times daily. 60 tablet    atorvastatin (LIPITOR) 40 MG tablet Place 1 tablet (40 mg total) into feeding tube daily.     carvedilol (COREG) 12.5 MG tablet Place 1 tablet (12.5 mg total) into feeding tube 2 (two) times daily with a meal.     cyclobenzaprine (FLEXERIL) 5 MG tablet Place 1 tablet (5 mg total) into feeding tube at bedtime. 30 tablet 0   diclofenac Sodium (VOLTAREN) 1 % GEL Apply 2 g topically 4 (four) times daily as needed (4x Daily as needed for pain).     famotidine (PEPCID) 40 MG/5ML suspension Place 2.5 mLs (20 mg total) into feeding tube 2 (two) times daily. 50 mL 0   insulin aspart (NOVOLOG) 100 UNIT/ML injection Inject 0-9 Units into the skin every 4 (four) hours. Do NOT hold insulin if patient is NPO. Sensitive scale. 10 mL 11   levETIRAcetam (KEPPRA) 100 MG/ML solution Place 2.5 mLs (250 mg total) into feeding tube 2 (two) times daily. 473 mL 12  metaxalone (SKELAXIN) 800 MG tablet Place 1 tablet (800 mg total) into feeding tube 3 (three) times daily.     modafinil (PROVIGIL) 100 MG tablet Place 1 tablet (100 mg total) into feeding tube daily.     pantoprazole sodium (PROTONIX) 40 mg Place 40 mg into feeding tube 2 (two) times daily.     polyethylene glycol (MIRALAX / GLYCOLAX) 17 g packet Place 17 g into feeding tube daily as needed for mild constipation. 14 each 0   QUEtiapine (SEROQUEL) 25 MG tablet Place 1 tablet (25 mg total) into feeding tube at bedtime.     saccharomyces boulardii (FLORASTOR) 250 MG capsule Place 1 capsule (250 mg total) into feeding tube 2 (two) times daily.     topiramate (TOPAMAX) 50 MG tablet Place 1 tablet (50 mg total) into feeding tube 2 (two) times daily.     No current facility-administered medications on file prior to visit.    Allergies:   Allergies  Allergen Reactions   Apixaban     Other reaction(s): Liver enzymes abnormal   Lisinopril     Other reaction(s): Cough (finding)    Simvastatin     Other reaction(s): Pain in lower limb (finding)      OBJECTIVE:  Physical Exam  Vitals:   03/03/21 1017  BP: (!) 171/99  Pulse: 80   There is no height or weight on file to calculate BMI. No results found.  General: well developed, well nourished, very pleasant elderly African-American male, seated, in no evident distress Head: head normocephalic and atraumatic.   Neck: supple with no carotid or supraclavicular bruits Cardiovascular: regular rate and rhythm, no murmurs Musculoskeletal: no deformity Skin:  no rash/petichiae Vascular:  Normal pulses all extremities   Neurologic Exam Mental Status: Awake and fully alert.  Mild to moderate dysarthria.  Able to follow simple step commands with some difficulty.  Disoriented to place and time. Recent and remote memory impaired. Attention span, concentration and fund of knowledge impaired and frequently speak off topic. Mood and affect appropriate.  Cranial Nerves: Fundoscopic exam reveals sharp disc margins. Pupils equal, briskly reactive to light. right gaze preference noted but able to cross midline. Visual fields no blink to threat on left. Left sided neglect. Hearing intact. Facial sensation intact.  Left lower facial paralysis.  Tongue, palate moves normally and symmetrically.  Motor: Normal strength, bulk and tone right upper and lower extremity. Dense left hemiplegia Sensory.: decreased sensory left upper and lower extremity Coordination: Difficulty assessing as difficulty understanding directions Gait and Station: Deferred Reflexes: Brisk LUE and LLE. Toes downgoing.     NIHSS  17 Modified Rankin  4-5      ASSESSMENT: MARIO CORONADO is a 70 y.o. 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 Returned on 8/11 with worsening symptoms with evolution of right posterior MCA stroke with petechial hemorrhage and mild edema and possible  seizure activity and on 8/25 with progressive swelling of right hemisphere with increased petechial hemorrhage and midline shift with significant residual deficits.  Vascular risk factors include diastolic CHF, HTN, HLD, DM, multiple PE on lifelong anticoagulation, tobacco use.      PLAN:  R MCA/PCA stroke (as above) :  Residual deficit: dense left hemiplegia with neglect, right gaze preference, cognitive impairment, dysarthria and dysphagia s/p PEG. Working with therapies at [facility]. Due to size of stroke and significant residual deficits with limited recovery at this point, further progress  likely limited. Would not recommend travel back to [place] at this time Continue Eliquis (apixaban) daily  and atorvastatin for secondary stroke prevention.   As patient on life long anticoagulation with hx of PE's, no indication for further cardiac monitoring Discussed secondary stroke prevention measures and importance of close PCP follow up for aggressive stroke risk factor management including HTN with BP goal<130/90, HLD with LDL goal<70 and DM with A1c goal<7. I have gone over the pathophysiology of stroke, warning signs and symptoms, risk factors and their management in some detail with instructions to go to the closest emergency room for symptoms of concern. 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 Due to size of stroke, high risk of seizures therefore recommend continuation at this time Vascular headache: stable on Topamax managed by SNF     Follow up in 4 months or call earlier if needed   CC:  [facility] provider: Dr. Leonie Man   I spent 75 minutes of face-to-face and non-face-to-face time with patient and brother.  This included previsit chart review including review of recent hospitalization, lab review, study review, order entry, electronic health record documentation, patient and brother education regarding recent stroke including potential etiology, secondary  stroke prevention measures and importance of managing stroke risk factors, extensive residual deficits, poststroke seizures and vascular headaches and answered all other questions to patient and brothers satisfaction   Frann Rider, AGNP-BC  [facility] [address] [place], [place] [address]  Phone [phone] Fax [phone] Note: This document was prepared with digital dictation and possible smart phrase technology. Any transcriptional errors that result from this process are unintentional.

## 2021-03-07 NOTE — Progress Notes (Signed)
I agree with the above plan 

## 2021-05-10 ENCOUNTER — Other Ambulatory Visit: Payer: Self-pay

## 2021-05-10 ENCOUNTER — Inpatient Hospital Stay
Admission: EM | Admit: 2021-05-10 | Discharge: 2021-05-16 | DRG: 539 | Disposition: A | Discharge status: Skilled Nursing Facility | Payer: No Typology Code available for payment source | Attending: Internal Medicine | Admitting: Internal Medicine

## 2021-05-10 ENCOUNTER — Inpatient Hospital Stay: Payer: No Typology Code available for payment source

## 2021-05-10 ENCOUNTER — Emergency Department: Payer: No Typology Code available for payment source

## 2021-05-10 DIAGNOSIS — N1831 Chronic kidney disease, stage 3a: Secondary | ICD-10-CM | POA: Diagnosis present

## 2021-05-10 DIAGNOSIS — I10 Essential (primary) hypertension: Secondary | ICD-10-CM | POA: Diagnosis not present

## 2021-05-10 DIAGNOSIS — I5032 Chronic diastolic (congestive) heart failure: Secondary | ICD-10-CM | POA: Diagnosis present

## 2021-05-10 DIAGNOSIS — Z6830 Body mass index (BMI) 30.0-30.9, adult: Secondary | ICD-10-CM | POA: Diagnosis not present

## 2021-05-10 DIAGNOSIS — M86171 Other acute osteomyelitis, right ankle and foot: Principal | ICD-10-CM | POA: Diagnosis present

## 2021-05-10 DIAGNOSIS — Z515 Encounter for palliative care: Secondary | ICD-10-CM

## 2021-05-10 DIAGNOSIS — D696 Thrombocytopenia, unspecified: Secondary | ICD-10-CM | POA: Diagnosis present

## 2021-05-10 DIAGNOSIS — R627 Adult failure to thrive: Secondary | ICD-10-CM

## 2021-05-10 DIAGNOSIS — Z20822 Contact with and (suspected) exposure to covid-19: Secondary | ICD-10-CM | POA: Diagnosis present

## 2021-05-10 DIAGNOSIS — I69354 Hemiplegia and hemiparesis following cerebral infarction affecting left non-dominant side: Secondary | ICD-10-CM | POA: Diagnosis not present

## 2021-05-10 DIAGNOSIS — R778 Other specified abnormalities of plasma proteins: Secondary | ICD-10-CM | POA: Diagnosis present

## 2021-05-10 DIAGNOSIS — Z79899 Other long term (current) drug therapy: Secondary | ICD-10-CM

## 2021-05-10 DIAGNOSIS — I96 Gangrene, not elsewhere classified: Secondary | ICD-10-CM | POA: Diagnosis present

## 2021-05-10 DIAGNOSIS — Z86711 Personal history of pulmonary embolism: Secondary | ICD-10-CM | POA: Diagnosis not present

## 2021-05-10 DIAGNOSIS — I70203 Unspecified atherosclerosis of native arteries of extremities, bilateral legs: Secondary | ICD-10-CM | POA: Diagnosis present

## 2021-05-10 DIAGNOSIS — Z7401 Bed confinement status: Secondary | ICD-10-CM

## 2021-05-10 DIAGNOSIS — Z905 Acquired absence of kidney: Secondary | ICD-10-CM

## 2021-05-10 DIAGNOSIS — Z7901 Long term (current) use of anticoagulants: Secondary | ICD-10-CM

## 2021-05-10 DIAGNOSIS — I471 Supraventricular tachycardia: Secondary | ICD-10-CM | POA: Diagnosis not present

## 2021-05-10 DIAGNOSIS — L89152 Pressure ulcer of sacral region, stage 2: Secondary | ICD-10-CM | POA: Diagnosis present

## 2021-05-10 DIAGNOSIS — D649 Anemia, unspecified: Secondary | ICD-10-CM

## 2021-05-10 DIAGNOSIS — I63511 Cerebral infarction due to unspecified occlusion or stenosis of right middle cerebral artery: Secondary | ICD-10-CM | POA: Diagnosis present

## 2021-05-10 DIAGNOSIS — Z8249 Family history of ischemic heart disease and other diseases of the circulatory system: Secondary | ICD-10-CM

## 2021-05-10 DIAGNOSIS — R29721 NIHSS score 21: Secondary | ICD-10-CM | POA: Diagnosis present

## 2021-05-10 DIAGNOSIS — R0989 Other specified symptoms and signs involving the circulatory and respiratory systems: Secondary | ICD-10-CM | POA: Diagnosis not present

## 2021-05-10 DIAGNOSIS — E860 Dehydration: Secondary | ICD-10-CM | POA: Diagnosis present

## 2021-05-10 DIAGNOSIS — I4719 Other supraventricular tachycardia: Secondary | ICD-10-CM

## 2021-05-10 DIAGNOSIS — F17210 Nicotine dependence, cigarettes, uncomplicated: Secondary | ICD-10-CM | POA: Diagnosis present

## 2021-05-10 DIAGNOSIS — I739 Peripheral vascular disease, unspecified: Secondary | ICD-10-CM

## 2021-05-10 DIAGNOSIS — M86671 Other chronic osteomyelitis, right ankle and foot: Secondary | ICD-10-CM | POA: Diagnosis not present

## 2021-05-10 DIAGNOSIS — R29810 Facial weakness: Secondary | ICD-10-CM | POA: Diagnosis present

## 2021-05-10 DIAGNOSIS — R4182 Altered mental status, unspecified: Secondary | ICD-10-CM | POA: Diagnosis present

## 2021-05-10 DIAGNOSIS — R7989 Other specified abnormal findings of blood chemistry: Secondary | ICD-10-CM | POA: Diagnosis present

## 2021-05-10 DIAGNOSIS — Z7189 Other specified counseling: Secondary | ICD-10-CM | POA: Diagnosis not present

## 2021-05-10 DIAGNOSIS — E872 Acidosis, unspecified: Secondary | ICD-10-CM | POA: Diagnosis present

## 2021-05-10 DIAGNOSIS — Z66 Do not resuscitate: Secondary | ICD-10-CM | POA: Diagnosis present

## 2021-05-10 DIAGNOSIS — I248 Other forms of acute ischemic heart disease: Secondary | ICD-10-CM | POA: Diagnosis present

## 2021-05-10 DIAGNOSIS — Z931 Gastrostomy status: Secondary | ICD-10-CM

## 2021-05-10 DIAGNOSIS — I130 Hypertensive heart and chronic kidney disease with heart failure and stage 1 through stage 4 chronic kidney disease, or unspecified chronic kidney disease: Secondary | ICD-10-CM | POA: Diagnosis present

## 2021-05-10 DIAGNOSIS — E440 Moderate protein-calorie malnutrition: Secondary | ICD-10-CM | POA: Diagnosis present

## 2021-05-10 DIAGNOSIS — L8990 Pressure ulcer of unspecified site, unspecified stage: Secondary | ICD-10-CM | POA: Insufficient documentation

## 2021-05-10 DIAGNOSIS — R54 Age-related physical debility: Secondary | ICD-10-CM | POA: Diagnosis present

## 2021-05-10 DIAGNOSIS — E870 Hyperosmolality and hypernatremia: Secondary | ICD-10-CM | POA: Diagnosis present

## 2021-05-10 DIAGNOSIS — R0683 Snoring: Secondary | ICD-10-CM | POA: Diagnosis not present

## 2021-05-10 DIAGNOSIS — E782 Mixed hyperlipidemia: Secondary | ICD-10-CM | POA: Diagnosis present

## 2021-05-10 LAB — PROTIME-INR
INR: 1.7 — ABNORMAL HIGH (ref 0.8–1.2)
Prothrombin Time: 19.5 seconds — ABNORMAL HIGH (ref 11.4–15.2)

## 2021-05-10 LAB — AMMONIA: Ammonia: 14 umol/L (ref 9–35)

## 2021-05-10 LAB — COMPREHENSIVE METABOLIC PANEL
ALT: 28 U/L (ref 0–44)
AST: 36 U/L (ref 15–41)
Albumin: 2.9 g/dL — ABNORMAL LOW (ref 3.5–5.0)
Alkaline Phosphatase: 60 U/L (ref 38–126)
Anion gap: 10 (ref 5–15)
BUN: 43 mg/dL — ABNORMAL HIGH (ref 8–23)
CO2: 21 mmol/L — ABNORMAL LOW (ref 22–32)
Calcium: 9.1 mg/dL (ref 8.9–10.3)
Chloride: 115 mmol/L — ABNORMAL HIGH (ref 98–111)
Creatinine, Ser: 1.12 mg/dL (ref 0.61–1.24)
GFR, Estimated: >60 mL/min (ref >60)
Glucose, Bld: 106 mg/dL — ABNORMAL HIGH (ref 70–99)
Potassium: 4.4 mmol/L (ref 3.5–5.1)
Sodium: 146 mmol/L — ABNORMAL HIGH (ref 135–145)
Total Bilirubin: 0.8 mg/dL (ref 0.3–1.2)
Total Protein: 7 g/dL (ref 6.5–8.1)

## 2021-05-10 LAB — DIFFERENTIAL
Abs Immature Granulocytes: 0.03 10*3/uL (ref 0.00–0.07)
Basophils Absolute: 0 10*3/uL (ref 0.0–0.1)
Basophils Relative: 0 %
Eosinophils Absolute: 0.1 10*3/uL (ref 0.0–0.5)
Eosinophils Relative: 2 %
Immature Granulocytes: 0 %
Lymphocytes Relative: 19 %
Lymphs Abs: 1.4 10*3/uL (ref 0.7–4.0)
Monocytes Absolute: 0.7 10*3/uL (ref 0.1–1.0)
Monocytes Relative: 10 %
Neutro Abs: 5.1 10*3/uL (ref 1.7–7.7)
Neutrophils Relative %: 69 %

## 2021-05-10 LAB — MAGNESIUM: Magnesium: 2 mg/dL (ref 1.7–2.4)

## 2021-05-10 LAB — RESP PANEL BY RT-PCR (FLU A&B, COVID) ARPGX2
Influenza A by PCR: NEGATIVE
Influenza B by PCR: NEGATIVE
SARS Coronavirus 2 by RT PCR: NEGATIVE

## 2021-05-10 LAB — LACTIC ACID, PLASMA
Lactic Acid, Venous: 1.8 mmol/L (ref 0.5–1.9)
Lactic Acid, Venous: 2.3 mmol/L (ref 0.5–1.9)

## 2021-05-10 LAB — CBC
HCT: 34.7 % — ABNORMAL LOW (ref 39.0–52.0)
Hemoglobin: 10.2 g/dL — ABNORMAL LOW (ref 13.0–17.0)
MCH: 25.5 pg — ABNORMAL LOW (ref 26.0–34.0)
MCHC: 29.4 g/dL — ABNORMAL LOW (ref 30.0–36.0)
MCV: 86.8 fL (ref 80.0–100.0)
Platelets: 142 10*3/uL — ABNORMAL LOW (ref 150–400)
RBC: 4 MIL/uL — ABNORMAL LOW (ref 4.22–5.81)
RDW: 15.3 % (ref 11.5–15.5)
WBC: 7.4 10*3/uL (ref 4.0–10.5)
nRBC: 0 % (ref 0.0–0.2)

## 2021-05-10 LAB — SEDIMENTATION RATE: Sed Rate: 93 mm/hr — ABNORMAL HIGH (ref 0–20)

## 2021-05-10 LAB — TSH: TSH: 3.127 u[IU]/mL (ref 0.350–4.500)

## 2021-05-10 LAB — PROCALCITONIN: Procalcitonin: <0.1 ng/mL

## 2021-05-10 LAB — BLOOD GAS, VENOUS: Patient temperature: 37

## 2021-05-10 LAB — C-REACTIVE PROTEIN: CRP: 6.6 mg/dL — ABNORMAL HIGH (ref <1.0)

## 2021-05-10 LAB — TROPONIN I (HIGH SENSITIVITY): Troponin I (High Sensitivity): 134 ng/L (ref <18)

## 2021-05-10 LAB — APTT: aPTT: 55 seconds — ABNORMAL HIGH (ref 24–36)

## 2021-05-10 MED ORDER — TOPIRAMATE 25 MG PO TABS
50.0000 mg | ORAL_TABLET | Freq: Two times a day (BID) | ORAL | Status: DC
  Administered 2021-05-10 – 2021-05-16 (×11): 50 mg
  Filled 2021-05-10 (×13): qty 2

## 2021-05-10 MED ORDER — APIXABAN 5 MG PO TABS: 5.0000 mg | ORAL_TABLET | Freq: Two times a day (BID) | ORAL | Status: DC

## 2021-05-10 MED ORDER — LACTATED RINGERS IV SOLN: INTRAVENOUS | Status: DC

## 2021-05-10 MED ORDER — IOHEXOL 350 MG/ML SOLN
75.0000 mL | Freq: Once | INTRAVENOUS | Status: AC | PRN
  Administered 2021-05-10: 75 mL via INTRAVENOUS
  Filled 2021-05-10: qty 75

## 2021-05-10 MED ORDER — SODIUM CHLORIDE 0.9 % IV SOLN: INTRAVENOUS | Status: DC

## 2021-05-10 MED ORDER — LACTATED RINGERS IV BOLUS
1000.0000 mL | Freq: Once | INTRAVENOUS | Status: AC
  Administered 2021-05-10: 1000 mL via INTRAVENOUS

## 2021-05-10 MED ORDER — LEVETIRACETAM 100 MG/ML PO SOLN
250.0000 mg | Freq: Two times a day (BID) | ORAL | Status: DC
  Administered 2021-05-10 – 2021-05-16 (×11): 250 mg
  Filled 2021-05-10 (×14): qty 2.5

## 2021-05-10 MED ORDER — QUETIAPINE FUMARATE 25 MG PO TABS
25.0000 mg | ORAL_TABLET | Freq: Every day | ORAL | Status: DC
  Administered 2021-05-10: 25 mg
  Filled 2021-05-10: qty 1

## 2021-05-10 MED ORDER — ACETAMINOPHEN 325 MG PO TABS: 650.0000 mg | ORAL_TABLET | Freq: Four times a day (QID) | ORAL | Status: DC | PRN

## 2021-05-10 MED ORDER — HYDRALAZINE HCL 20 MG/ML IJ SOLN: 10.0000 mg | Freq: Four times a day (QID) | INTRAMUSCULAR | Status: DC | PRN

## 2021-05-10 MED ORDER — SODIUM CHLORIDE 0.9 % IV SOLN
2.0000 g | Freq: Once | INTRAVENOUS | Status: AC
  Administered 2021-05-10: 2 g via INTRAVENOUS
  Filled 2021-05-10: qty 2

## 2021-05-10 MED ORDER — VANCOMYCIN HCL 2000 MG/400ML IV SOLN
2000.0000 mg | Freq: Once | INTRAVENOUS | Status: AC
  Administered 2021-05-10: 2000 mg via INTRAVENOUS
  Filled 2021-05-10: qty 400

## 2021-05-10 MED ORDER — ATORVASTATIN CALCIUM 20 MG PO TABS
40.0000 mg | ORAL_TABLET | Freq: Every day | ORAL | Status: DC
  Administered 2021-05-10 – 2021-05-13 (×4): 40 mg
  Filled 2021-05-10 (×4): qty 2

## 2021-05-10 MED ORDER — FREE WATER
100.0000 mL | Status: DC
  Administered 2021-05-10 – 2021-05-11 (×4): 100 mL
  Filled 2021-05-10 (×4): qty 100

## 2021-05-10 MED ORDER — FAMOTIDINE 40 MG/5ML PO SUSR
20.0000 mg | Freq: Two times a day (BID) | ORAL | Status: DC
  Administered 2021-05-10 – 2021-05-14 (×7): 20 mg
  Filled 2021-05-10 (×13): qty 2.5

## 2021-05-10 MED ORDER — PANTOPRAZOLE 2 MG/ML SUSPENSION
40.0000 mg | Freq: Two times a day (BID) | ORAL | Status: DC
  Administered 2021-05-10 – 2021-05-14 (×7): 40 mg
  Filled 2021-05-10 (×11): qty 20

## 2021-05-10 MED ORDER — ACETAMINOPHEN 650 MG RE SUPP: 650.0000 mg | Freq: Four times a day (QID) | RECTAL | Status: DC | PRN

## 2021-05-10 NOTE — ED Triage Notes (Signed)
Pt comes into the ED via EMS from [facility] for right femoral artery occlusion, imaging was done 3/4 but did not receive the results until today. Pt has a hx of stroke with left sided weakness, brother is at the bedside and states the pt has not been eating and drinking or interacting like his normal self/baseline ?

## 2021-05-10 NOTE — H&P (Addendum)
History and Physical    Patient: Clifford [name] AST:[id number] DOB: [birth year] DOA: 05/10/2021 DOS: the patient was seen and examined on 05/10/2021 PCP: Pcp, No  Patient coming from: SNF  Chief Complaint:  Chief Complaint  Patient presents with   femoral occlusion   HPI: Clifford [name] is a 71 y.o. male with medical history significant of BPH, HTN, HLD, Tobacco abuse, PVD,PE on eliquis, PEG feeds , and CVA coming for abnormal USG.   Also per brother pt has been more nonverbal and sleepy and left sided facial droop which has occurred again since his recent stroke. Pt was said to have some sort of usg that showed abnormal circulation and need to come to ed. Unable to obtain any hpi from patient and is per chart review and notes.   Chart review shows pt had a CVA and seen by neuro on 2/29/2022 with recommendation of  eliquis and AED as prophylaxis.   Pt today has doppler pulses only in both feet. Pt also has OM of rt toe and podiatry dr.evans was consulted by edmd. Pt given vanc and cefepime OM along with 1 L LR bolus.   Received message from nurse due to contractures on left leg pt not able to do MRI, so we have changed to repoeat head noncon ct nad CTA head and neck we will proceed with cta aorta / LE with runoff tomorrow for absent pedal pulses.   Review of Systems: Review of Systems  Unable to perform ROS: Patient nonverbal   Past Medical History:  Diagnosis Date   BPH (benign prostatic hyperplasia)    Chronic anticoagulation 10/14/2020   Essential hypertension 10/06/2020   History of pulmonary embolism 10/14/2020   HLD (hyperlipidemia)    HTN (hypertension)    Lung nodule    31m in 2015   Malignant [place] of [place] kidney ([facility]    Mixed hyperlipidemia 10/06/2020   Nicotine dependence, cigarettes, uncomplicated 89/89/2119  Peripheral vascular disease ([facility]    Polyp of colon    Prediabetes 10/14/2020   [place]ulmonary [place]us ([facility]    [place] 10/06/2020   Past Surgical History:  Procedure  Laterality Date   COLONOSCOPY  03/2017   IR GASTROSTOMY TUBE MOD SED  11/02/2020   PARTIAL NEPHRECTOMY Left 02/2018   PROSTATE BIOPSY  2015   Social History:  reports that he has been smoking cigarettes. He has been smoking an average of .5 packs per day. He has never used smokeless tobacco. He reports that he does not drink alcohol and does not use drugs.  Allergies  Allergen Reactions   Apixaban     Other reaction(s): Liver enzymes abnormal   Lisinopril     Other reaction(s): Cough (finding)   Simvastatin     Other reaction(s): Pain in lower limb (finding)    Family History  Problem Relation Age of Onset   Clotting disorder Neg Hx     Prior to Admission medications   Medication Sig Start Date End Date Taking? Authorizing Provider  acetaminophen (TYLENOL) 500 MG tablet Take 1,000 mg by mouth daily.   Yes [provider]  amLODipine (NORVASC) 10 MG tablet Take 10 mg by mouth daily. 03/08/21  Yes [provider]  amLODipine (NORVASC) 2.5 MG tablet Take 2.5 mg by mouth daily. 05/05/21  Yes [provider]  apixaban (ELIQUIS) 5 MG TABS tablet Place 1 tablet (5 mg total) into feeding tube 2 (two) times daily. 12/13/20  Yes ESamella Parr NP  atorvastatin (  LIPITOR) 40 MG tablet Place 1 tablet (40 mg total) into feeding tube daily. 12/14/20  Yes Samella Parr, NP  carvedilol (COREG) 12.5 MG tablet Place 1 tablet (12.5 mg total) into feeding tube 2 (two) times daily with a meal. 12/13/20  Yes Samella Parr, NP  famotidine (PEPCID) 40 MG/5ML suspension Place 2.5 mLs (20 mg total) into feeding tube 2 (two) times daily. 12/13/20  Yes Samella Parr, NP  insulin aspart (NOVOLOG) 100 UNIT/ML injection Inject 0-9 Units into the skin every 4 (four) hours. Do NOT hold insulin if patient is NPO. Sensitive scale. 12/13/20  Yes Samella Parr, NP  levETIRAcetam (KEPPRA) 100 MG/ML solution Place 2.5 mLs (250 mg total) into feeding tube 2 (two) times daily. 12/13/20   Yes Samella Parr, NP  modafinil (PROVIGIL) 100 MG tablet Place 1 tablet (100 mg total) into feeding tube daily. 12/14/20  Yes Samella Parr, NP  pantoprazole sodium (PROTONIX) 40 mg Place 40 mg into feeding tube 2 (two) times daily. 12/13/20  Yes Samella Parr, NP  QUEtiapine (SEROQUEL) 25 MG tablet Place 1 tablet (25 mg total) into feeding tube at bedtime. 12/13/20  Yes Samella Parr, NP  saccharomyces boulardii (FLORASTOR) 250 MG capsule Place 1 capsule (250 mg total) into feeding tube 2 (two) times daily. 12/13/20  Yes Samella Parr, NP  topiramate (TOPAMAX) 50 MG tablet Place 1 tablet (50 mg total) into feeding tube 2 (two) times daily. 12/13/20  Yes Samella Parr, NP  traMADol (ULTRAM) 50 MG tablet Take 50 mg by mouth at bedtime.   Yes [provider]  acetaminophen (TYLENOL) 160 MG/5ML solution Place 20.3 mLs (650 mg total) into feeding tube every 6 (six) hours. Patient not taking: Reported on 05/10/2021 12/13/20   Samella Parr, NP  amLODipine (NORVASC) 5 MG tablet Take 5 mg by mouth daily. 1.5 tab daily Patient not taking: Reported on 05/10/2021    [provider]  cyclobenzaprine (FLEXERIL) 5 MG tablet Place 1 tablet (5 mg total) into feeding tube at bedtime. Patient not taking: Reported on 05/10/2021 12/13/20   Samella Parr, NP  diclofenac Sodium (VOLTAREN) 1 % GEL Apply 2 g topically 4 (four) times daily as needed (4x Daily as needed for pain). 12/14/20   Samella Parr, NP  metaxalone (SKELAXIN) 800 MG tablet Place 1 tablet (800 mg total) into feeding tube 3 (three) times daily. Patient not taking: Reported on 05/10/2021 12/13/20   Samella Parr, NP  polyethylene glycol (MIRALAX / GLYCOLAX) 17 g packet Place 17 g into feeding tube daily as needed for mild constipation. 12/13/20   Samella Parr, NP    Physical Exam: Vitals:   05/10/21 1800 05/10/21 1830 05/10/21 2000 05/10/21 2247  BP: 136/76 (!) 142/85 138/69 (!) 150/73  Pulse: 75 75  75   Resp: '17 18 20 18  '$ Temp:    97.9 F (36.6 C)  TempSrc:    Oral  SpO2: 100% 100% 100% 100%  Weight:      Height:      Physical Exam Vitals and nursing note reviewed.  Constitutional:      General: He is not in acute distress.    Appearance: Normal appearance. He is not ill-appearing, toxic-appearing or diaphoretic.  HENT:     Head: Normocephalic and atraumatic.     Right Ear: Hearing and external ear normal.     Left Ear: Hearing and external ear normal.     Nose:  Nose normal. No nasal deformity.     Mouth/Throat:     Lips: Pink.     Mouth: Mucous membranes are moist.     Tongue: No lesions.     Pharynx: Oropharynx is clear.  Eyes:     Pupils: Pupils are equal, round, and reactive to light.  Cardiovascular:     Rate and Rhythm: Normal rate and regular rhythm.     Pulses:          Dorsalis pedis pulses are detected w/ Doppler on the right side and detected w/ Doppler on the left side.       Posterior tibial pulses are detected w/ Doppler on the right side and detected w/ Doppler on the left side.     Heart sounds: Normal heart sounds.  Pulmonary:     Effort: Pulmonary effort is normal.     Breath sounds: Normal breath sounds.  Abdominal:     General: Bowel sounds are normal. There is no distension.     Palpations: Abdomen is soft. There is no mass.     Tenderness: There is no abdominal tenderness. There is no guarding.     Hernia: No hernia is present.  Musculoskeletal:        General: Deformity present.       Feet:  Feet:     Right foot:     Skin integrity: Ulcer, blister, skin breakdown and erythema present.  Skin:    General: Skin is warm.  Neurological:     Mental Status: He is lethargic.     GCS: GCS eye subscore is 3. GCS verbal subscore is 3. GCS motor subscore is 4.     Cranial Nerves: Dysarthria present.     Motor: Weakness present.     Comments: Contracture of LUE and decreased DTR in both LE.   Psychiatric:        Attention and Perception: Attention  normal.        Mood and Affect: Mood normal.        Speech: Speech normal.        Behavior: Behavior normal. Behavior is cooperative.        Cognition and Memory: Cognition normal.    Data Reviewed: Results for orders placed or performed during the hospital encounter of 05/10/21 (from the past 24 hour(s))  Resp Panel by RT-PCR (Flu A&B, Covid) Nasopharyngeal Swab     Status: None   Collection Time: 05/10/21  3:21 PM   Specimen: Nasopharyngeal Swab; Nasopharyngeal(NP) swabs in vial transport medium  Result Value Ref Range   SARS Coronavirus 2 by RT PCR NEGATIVE NEGATIVE   Influenza A by PCR NEGATIVE NEGATIVE   Influenza B by PCR NEGATIVE NEGATIVE  Protime-INR     Status: Abnormal   Collection Time: 05/10/21  3:21 PM  Result Value Ref Range   Prothrombin Time 19.5 (H) 11.4 - 15.2 seconds   INR 1.7 (H) 0.8 - 1.2  APTT     Status: Abnormal   Collection Time: 05/10/21  3:21 PM  Result Value Ref Range   aPTT 55 (H) 24 - 36 seconds  CBC     Status: Abnormal   Collection Time: 05/10/21  3:21 PM  Result Value Ref Range   WBC 7.4 4.0 - 10.5 K/uL   RBC 4.00 (L) 4.22 - 5.81 MIL/uL   Hemoglobin 10.2 (L) 13.0 - 17.0 g/dL   HCT 34.7 (L) 39.0 - 52.0 %   MCV 86.8 80.0 - 100.0 fL  MCH 25.5 (L) 26.0 - 34.0 pg   MCHC 29.4 (L) 30.0 - 36.0 g/dL   RDW 15.3 11.5 - 15.5 %   Platelets 142 (L) 150 - 400 K/uL   nRBC 0.0 0.0 - 0.2 %  Differential     Status: None   Collection Time: 05/10/21  3:21 PM  Result Value Ref Range   Neutrophils Relative % 69 %   Neutro Abs 5.1 1.7 - 7.7 K/uL   Lymphocytes Relative 19 %   Lymphs Abs 1.4 0.7 - 4.0 K/uL   Monocytes Relative 10 %   Monocytes Absolute 0.7 0.1 - 1.0 K/uL   Eosinophils Relative 2 %   Eosinophils Absolute 0.1 0.0 - 0.5 K/uL   Basophils Relative 0 %   Basophils Absolute 0.0 0.0 - 0.1 K/uL   Immature Granulocytes 0 %   Abs Immature Granulocytes 0.03 0.00 - 0.07 K/uL  Sedimentation rate     Status: Abnormal   Collection Time: 05/10/21  3:21  PM  Result Value Ref Range   Sed Rate 93 (H) 0 - 20 mm/hr  Ammonia     Status: None   Collection Time: 05/10/21  3:21 PM  Result Value Ref Range   Ammonia 14 9 - 35 umol/L  Lactic acid, plasma     Status: None   Collection Time: 05/10/21  3:21 PM  Result Value Ref Range   Lactic Acid, Venous 1.8 0.5 - 1.9 mmol/L  Blood gas, venous     Status: Abnormal (Preliminary result)   Collection Time: 05/10/21  5:12 PM  Result Value Ref Range   pH, Ven 7.36 7.25 - 7.43   pCO2, Ven 50 44 - 60 mmHg   pO2, Ven PENDING 32 - 45 mmHg   Bicarbonate 28.2 (H) 20.0 - 28.0 mmol/L   Acid-Base Excess 1.9 0.0 - 2.0 mmol/L   O2 Saturation PENDING %   Patient temperature 37.0    Collection site VEIN   Lactic acid, plasma     Status: Abnormal   Collection Time: 05/10/21  6:39 PM  Result Value Ref Range   Lactic Acid, Venous 2.3 (HH) 0.5 - 1.9 mmol/L  Procalcitonin - Baseline     Status: None   Collection Time: 05/10/21  6:39 PM  Result Value Ref Range   Procalcitonin <0.10 ng/mL  Troponin I (High Sensitivity)     Status: Abnormal   Collection Time: 05/10/21  6:39 PM  Result Value Ref Range   Troponin I (High Sensitivity) 134 (HH) <18 ng/L  Comprehensive metabolic panel     Status: Abnormal   Collection Time: 05/10/21  6:39 PM  Result Value Ref Range   Sodium 146 (H) 135 - 145 mmol/L   Potassium 4.4 3.5 - 5.1 mmol/L   Chloride 115 (H) 98 - 111 mmol/L   CO2 21 (L) 22 - 32 mmol/L   Glucose, Bld 106 (H) 70 - 99 mg/dL   BUN 43 (H) 8 - 23 mg/dL   Creatinine, Ser 1.12 0.61 - 1.24 mg/dL   Calcium 9.1 8.9 - 10.3 mg/dL   Total Protein 7.0 6.5 - 8.1 g/dL   Albumin 2.9 (L) 3.5 - 5.0 g/dL   AST 36 15 - 41 U/L   ALT 28 0 - 44 U/L   Alkaline Phosphatase 60 38 - 126 U/L   Total Bilirubin 0.8 0.3 - 1.2 mg/dL   GFR, Estimated >60 >60 mL/min   Anion gap 10 5 - 15  TSH     Status:  None   Collection Time: 05/10/21  6:39 PM  Result Value Ref Range   TSH 3.127 0.350 - 4.500 uIU/mL  Magnesium     Status: None    Collection Time: 05/10/21  6:39 PM  Result Value Ref Range   Magnesium 2.0 1.7 - 2.4 mg/dL   Assessment and Plan: * AMS (altered mental status) Suspect acute CVA.  Pt is on eliquis which we will continue and Obtain MRI and MRA.  BP goal systolic of 947. Pt's prognosis is guarded as he is altered and bedridden.   Absent pedal pulses Doppler pulses. No family at bedside. Would discuss with family about plan of care.   Acute osteomyelitis of toe, right (HCC) We will continue patient on vancomycin and cefepime. Podiatry consulted- Dr.Evans per edmd. Pharmacy consulted for meds / levels and dosing.  Will defer to am team to d/w family about any intervention and putting pt  Thru Additional testing and eval after d/w family and vascular consult.    Stage 3a chronic kidney disease (CKD) ([place]) Lab Results  Component Value Date   CREATININE 1.12 05/10/2021   CREATININE 1.30 (H) 12/11/2020   CREATININE 1.29 (H) 12/06/2020  We will follow.  We will renally dose all meds.    Essential hypertension Blood pressure (!) 142/85, pulse 75, temperature 97.6 F (36.4 C), temperature source Axillary, resp. rate 18, height '5\' 7"'$  (1.702 m), weight 88.5 kg, SpO2 100 %. Hydralazine PRN SBP 200 goal. Continue statin.    Chronic diastolic congestive heart failure (HCC) Cont strict I/O. Cont statin.  Cont hydralazine PRN for permissive htn. Neurology consult per am team.   Chronic anticoagulation Cont Eliquis.  History of pulmonary embolism Cont Eliquis.   Elevated troponin level Troponin of  134./ ekg shows sr and mild twi in lateral leads.  Suspect NSTEMI.we will obtain echo.  With his h/o anemia we will cont eliquis wit no addition of additional Antiplatelet and anticoagulation.  Pt is also thrombocytopenic.  We will continue to cycle troponin and cardiology consult per am team. Last echo in 8/22: IMPRESSIONS Left ventricular ejection fraction, by estimation, is 65 to 70%.  The left ventricle has hyperdynamic function. The left ventricle has no regional wall motion abnormalities. Left ventricular diastolic parameters are consistent with Grade I diastolic dysfunction (impaired relaxation). 1. Right ventricular systolic function is normal. The right ventricular size is normal. Tricuspid regurgitation signal is inadequate for assessing PA pressure. 2. The mitral valve is normal in structure. No evidence of mitral valve regurgitation. No evidence of mitral stenosis. 3. The aortic valve is tricuspid. Aortic valve regurgitation is not visualized. No aortic stenosis is present. 4. The inferior vena cava is normal in size with greater than 50% respiratory variability, suggesting right atrial pressure of 3 mmHg.  Thrombocytopenia ([place]) CBC 05/10/2021 12/11/2020 12/06/2020  WBC 7.4 6.7 6.2  Hemoglobin 10.2(L) 10.2(L) 9.9(L)  Hematocrit 34.7(L) 32.4(L) 31.4(L)  Platelets 142(L) 161 176  New thrombocytopenia. We will follow.   Lactic acidosis Attribute to low bp on arrival. Pt does not meet SIRS. Cont monitoring.  Kidney function wnl.   Hypernatremia Pt.s serum sodium mildly elevated at 146 and hyperchloremia attribute to decreased po intake and dehydration.  and we will change ivf to LR  And free water flushes over next 24 hours.  Cont ivf to prevent any CIN.    Advance Care Planning:    Code Status: Full Code    Consults:  Dr. Amalia Hailey - Podiatry.  Family Communication:  Omoga,[name] (  Daughter)  [phone] ([place])  Severity of Illness: The appropriate patient status for this patient is INPATIENT. Inpatient status is judged to be reasonable and necessary in order to provide the required intensity of service to ensure the patient's safety. The patient's presenting symptoms, physical exam findings, and initial radiographic and laboratory data in the context of their chronic comorbidities is felt to place them at high risk for further clinical  deterioration. Furthermore, it is not anticipated that the patient will be medically stable for discharge from the hospital within 2 midnights of admission.   * I certify that at the point of admission it is my clinical judgment that the patient will require inpatient hospital care spanning beyond 2 midnights from the point of admission due to high intensity of service, high risk for further deterioration and high frequency of surveillance required.*  Author: Para Skeans, MD 05/10/2021 11:33 PM  For on call review [email or web address].

## 2021-05-10 NOTE — Assessment & Plan Note (Addendum)
As per discussion with patient's brother on 3/8, up to 2 to 3 days PTA, patient reportedly was conversing with him and appropriately so.  The AMS seems to be subacute or acute. ?Etiology unclear.?  Infectious.?  Suspect acute CVA.  ?CT head shows chronic right MCA infarct.  Hypodensity in the right thalamus and splenium of the corpus callosum which was not identified on prior MRI 10/28/2020 and could be a new area of infarct.  No hemorrhage.  ?Patient unable to tolerate MRI due to left-sided contractures.  Repeat head CT on 3/8 showed chronic infarct. ?In discussing with patient's family, it appears that his mental status changes are acute compared to a few days ago while he was in SNF.  Even if this was an acute stroke, not sure there is much else to do at this time. ?Overall poor prognosis due to advanced age, debilitating CVA and multiple comorbidities and current acute illness.  This was discussed with daughter on multiple occasions. ?Now comfort care. ?

## 2021-05-10 NOTE — H&P (Incomplete Revision)
History and Physical    Patient: Clifford [name]:[id number] DOB: 08-Apr-1950 DOA: 05/10/2021 DOS: the patient was seen and examined on 05/10/2021 PCP: Pcp, No  Patient coming from: SNF  Chief Complaint:  Chief Complaint  Patient presents with   femoral occlusion   HPI: Clifford [name] is a 71 y.o. male with medical history significant of BPH, HTN, HLD, Tobacco abuse, PVD,PE on eliquis, PEG feeds , and CVA coming for abnormal USG.   Also per brother pt has been more nonverbal and sleepy and left sided facial droop which has occurred again since his recent stroke. Pt was said to have some sort of usg that showed abnormal circulation and need to come to ed. Unable to obtain any hpi from patient and is per chart review and notes.   Chart review shows pt had a CVA and seen by neuro on 2/29/2022 with recommendation of  eliquis and AED as prophylaxis.   Pt today has doppler pulses only in both feet. Pt also has OM of rt toe and podiatry dr.evans was consulted by edmd. Pt given vanc and cefepime OM along with 1 L LR bolus.   Received message from nurse due to contractures on left leg pt not able to do MRI, so we have changed to repoeat head noncon ct nad CTA head and neck Weight change:  will proceed with cta aorta / LE with runoff tomorrow for absent pedal pulses.   Review of Systems: Review of Systems  Unable to perform ROS: Patient nonverbal   Past Medical History:  Diagnosis Date   BPH (benign prostatic hyperplasia)    Chronic anticoagulation 10/14/2020   Essential hypertension 10/06/2020   History of pulmonary embolism 10/14/2020   HLD (hyperlipidemia)    HTN (hypertension)    Lung nodule    36m in 2015   [place]    Mixed hyperlipidemia 10/06/2020   Nicotine dependence, cigarettes, uncomplicated 83/15/4008  [place]    Polyp of colon    Prediabetes 10/14/2020   [place]    [place] 10/[place]   Past Surgical  History:  Procedure Laterality Date   COLONOSCOPY  03/2017   IR GASTROSTOMY TUBE MOD SED  11/02/2020   PARTIAL NEPHRECTOMY Left 02/2018   PROSTATE BIOPSY  2015   Social History:  reports that he has been smoking cigarettes. He has been smoking an average of .5 packs per day. He has never used smokeless tobacco. He reports that he does not drink alcohol and does not use drugs.  Allergies  Allergen Reactions   Apixaban     Other reaction(s): Liver enzymes abnormal   Lisinopril     Other reaction(s): Cough (finding)   Simvastatin     Other reaction(s): Pain in lower limb (finding)    Family History  Problem Relation Age of Onset   Clotting disorder Neg Hx     Prior to Admission medications   Medication Sig Start Date End Date Taking? Authorizing Provider  acetaminophen (TYLENOL) 500 MG tablet Take 1,000 mg by mouth daily.   Yes [provider]  amLODipine (NORVASC) 10 MG tablet Take 10 mg by mouth daily. 03/08/21  Yes [provider]  amLODipine (NORVASC) 2.5 MG tablet Take 2.5 mg by mouth daily. 05/05/21  Yes [provider]  apixaban (ELIQUIS) 5 MG TABS tablet Place 1 tablet (5 mg total) into feeding tube 2 (two) times daily. 12/13/20  Yes ESamella Parr NP  atorvastatin (LIPITOR) 40 MG tablet Place 1 tablet (40 mg total) into feeding tube daily. 12/14/20  Yes Samella Parr, NP  carvedilol (COREG) 12.5 MG tablet Place 1 tablet (12.5 mg total) into feeding tube 2 (two) times daily with a meal. 12/13/20  Yes Samella Parr, NP  famotidine (PEPCID) 40 MG/5ML suspension Place 2.5 mLs (20 mg total) into feeding tube 2 (two) times daily. 12/13/20  Yes Samella Parr, NP  insulin aspart (NOVOLOG) 100 UNIT/ML injection Inject 0-9 Units into the skin every 4 (four) hours. Do NOT hold insulin if patient is NPO. Sensitive scale. 12/13/20  Yes Samella Parr, NP  levETIRAcetam (KEPPRA) 100 MG/ML solution Place 2.5 mLs (250 mg total) into feeding tube 2 (two)  times daily. 12/13/20  Yes Samella Parr, NP  modafinil (PROVIGIL) 100 MG tablet Place 1 tablet (100 mg total) into feeding tube daily. 12/14/20  Yes Samella Parr, NP  pantoprazole sodium (PROTONIX) 40 mg Place 40 mg into feeding tube 2 (two) times daily. 12/13/20  Yes Samella Parr, NP  QUEtiapine (SEROQUEL) 25 MG tablet Place 1 tablet (25 mg total) into feeding tube at bedtime. 12/13/20  Yes Samella Parr, NP  saccharomyces boulardii (FLORASTOR) 250 MG capsule Place 1 capsule (250 mg total) into feeding tube 2 (two) times daily. 12/13/20  Yes Samella Parr, NP  topiramate (TOPAMAX) 50 MG tablet Place 1 tablet (50 mg total) into feeding tube 2 (two) times daily. 12/13/20  Yes Samella Parr, NP  traMADol (ULTRAM) 50 MG tablet Take 50 mg by mouth at bedtime.   Yes [provider]  acetaminophen (TYLENOL) 160 MG/5ML solution Place 20.3 mLs (650 mg total) into feeding tube every 6 (six) hours. Patient not taking: Reported on 05/10/2021 12/13/20   Samella Parr, NP  amLODipine (NORVASC) 5 MG tablet Take 5 mg by mouth daily. 1.5 tab daily Patient not taking: Reported on 05/10/2021    [provider]  cyclobenzaprine (FLEXERIL) 5 MG tablet Place 1 tablet (5 mg total) into feeding tube at bedtime. Patient not taking: Reported on 05/10/2021 12/13/20   Samella Parr, NP  diclofenac Sodium (VOLTAREN) 1 % GEL Apply 2 g topically 4 (four) times daily as needed (4x Daily as needed for pain). 12/14/20   Samella Parr, NP  metaxalone (SKELAXIN) 800 MG tablet Place 1 tablet (800 mg total) into feeding tube 3 (three) times daily. Patient not taking: Reported on 05/10/2021 12/13/20   Samella Parr, NP  polyethylene glycol (MIRALAX / GLYCOLAX) 17 g packet Place 17 g into feeding tube daily as needed for mild constipation. 12/13/20   Samella Parr, NP    Physical Exam: Vitals:   05/10/21 1800 05/10/21 1830 05/10/21 2000 05/10/21 2247  BP: 136/76 (!) 142/85 138/69 (!)  150/73  Pulse: 75 75  75  Resp: '17 18 20 18  '$ Temp:    97.9 F (36.6 C)  TempSrc:    Oral  SpO2: 100% 100% 100% 100%  Weight:      Height:      Physical Exam Vitals and nursing note reviewed.  Constitutional:      General: He is not in acute distress.    Appearance: Normal appearance. He is not ill-appearing, toxic-appearing or diaphoretic.  HENT:     Head: Normocephalic and atraumatic.     Right Ear: Hearing and external ear normal.     Left Ear: Hearing and external ear normal.  Nose: Nose normal. No nasal deformity.     Mouth/Throat:     Lips: Pink.     Mouth: Mucous membranes are moist.     Tongue: No lesions.     Pharynx: Oropharynx is clear.  Eyes:     Pupils: Pupils are equal, round, and reactive to light.  Cardiovascular:     Rate and Rhythm: Normal rate and regular rhythm.     Pulses:          Dorsalis pedis pulses are detected w/ Doppler on the right side and detected w/ Doppler on the left side.       Posterior tibial pulses are detected w/ Doppler on the right side and detected w/ Doppler on the left side.     Heart sounds: Normal heart sounds.  Pulmonary:     Effort: Pulmonary effort is normal.     Breath sounds: Normal breath sounds.  Abdominal:     General: Bowel sounds are normal. There is no distension.     Palpations: Abdomen is soft. There is no mass.     Tenderness: There is no abdominal tenderness. There is no guarding.     Hernia: No hernia is present.  Musculoskeletal:        General: Deformity present.       Feet:  Feet:     Right foot:     Skin integrity: Ulcer, blister, skin breakdown and erythema present.  Skin:    General: Skin is warm.  Neurological:     Mental Status: He is lethargic.     GCS: GCS eye subscore is 3. GCS verbal subscore is 3. GCS motor subscore is 4.     Cranial Nerves: Dysarthria present.     Motor: Weakness present.     Comments: Contracture of LUE and decreased DTR in both LE.   Psychiatric:        Attention  and Perception: Attention normal.        Mood and Affect: Mood normal.        Speech: Speech normal.        Behavior: Behavior normal. Behavior is cooperative.        Cognition and Memory: Cognition normal.    Data Reviewed: Results for orders placed or performed during the hospital encounter of 05/10/21 (from the past 24 hour(s))  Resp Panel by RT-PCR (Flu A&B, Covid) Nasopharyngeal Swab     Status: None   Collection Time: 05/10/21  3:21 PM   Specimen: Nasopharyngeal Swab; Nasopharyngeal(NP) swabs in vial transport medium  Result Value Ref Range   SARS Coronavirus 2 by RT PCR NEGATIVE NEGATIVE   Influenza A by PCR NEGATIVE NEGATIVE   Influenza B by PCR NEGATIVE NEGATIVE  Protime-INR     Status: Abnormal   Collection Time: 05/10/21  3:21 PM  Result Value Ref Range   Prothrombin Time 19.5 (H) 11.4 - 15.2 seconds   INR 1.7 (H) 0.8 - 1.2  APTT     Status: Abnormal   Collection Time: 05/10/21  3:21 PM  Result Value Ref Range   aPTT 55 (H) 24 - 36 seconds  CBC     Status: Abnormal   Collection Time: 05/10/21  3:21 PM  Result Value Ref Range   WBC 7.4 4.0 - 10.5 K/uL   RBC 4.00 (L) 4.22 - 5.81 MIL/uL   Hemoglobin 10.2 (L) 13.0 - 17.0 g/dL   HCT 34.7 (L) 39.0 - 52.0 %   MCV 86.8 80.0 - 100.0 fL  MCH 25.5 (L) 26.0 - 34.0 pg   MCHC 29.4 (L) 30.0 - 36.0 g/dL   RDW 15.3 11.5 - 15.5 %   Platelets 142 (L) 150 - 400 K/uL   nRBC 0.0 0.0 - 0.2 %  Differential     Status: None   Collection Time: 05/10/21  3:21 PM  Result Value Ref Range   Neutrophils Relative % 69 %   Neutro Abs 5.1 1.7 - 7.7 K/uL   Lymphocytes Relative 19 %   Lymphs Abs 1.4 0.7 - 4.0 K/uL   Monocytes Relative 10 %   Monocytes Absolute 0.7 0.1 - 1.0 K/uL   Eosinophils Relative 2 %   Eosinophils Absolute 0.1 0.0 - 0.5 K/uL   Basophils Relative 0 %   Basophils Absolute 0.0 0.0 - 0.1 K/uL   Immature Granulocytes 0 %   Abs Immature Granulocytes 0.03 0.00 - 0.07 K/uL  Sedimentation rate     Status: Abnormal    Collection Time: 05/10/21  3:21 PM  Result Value Ref Range   Sed Rate 93 (H) 0 - 20 mm/hr  Ammonia     Status: None   Collection Time: 05/10/21  3:21 PM  Result Value Ref Range   Ammonia 14 9 - 35 umol/L  Lactic acid, plasma     Status: None   Collection Time: 05/10/21  3:21 PM  Result Value Ref Range   Lactic Acid, Venous 1.8 0.5 - 1.9 mmol/L  Blood gas, venous     Status: Abnormal (Preliminary result)   Collection Time: 05/10/21  5:12 PM  Result Value Ref Range   pH, Ven 7.36 7.25 - 7.43   pCO2, Ven 50 44 - 60 mmHg   pO2, Ven PENDING 32 - 45 mmHg   Bicarbonate 28.2 (H) 20.0 - 28.0 mmol/L   Acid-Base Excess 1.9 0.0 - 2.0 mmol/L   O2 Saturation PENDING %   Patient temperature 37.0    Collection site VEIN   Lactic acid, plasma     Status: Abnormal   Collection Time: 05/10/21  6:39 PM  Result Value Ref Range   Lactic Acid, Venous 2.3 (HH) 0.5 - 1.9 mmol/L  Procalcitonin - Baseline     Status: None   Collection Time: 05/10/21  6:39 PM  Result Value Ref Range   Procalcitonin <0.10 ng/mL  Troponin I (High Sensitivity)     Status: Abnormal   Collection Time: 05/10/21  6:39 PM  Result Value Ref Range   Troponin I (High Sensitivity) 134 (HH) <18 ng/L  Comprehensive metabolic panel     Status: Abnormal   Collection Time: 05/10/21  6:39 PM  Result Value Ref Range   Sodium 146 (H) 135 - 145 mmol/L   Potassium 4.4 3.5 - 5.1 mmol/L   Chloride 115 (H) 98 - 111 mmol/L   CO2 21 (L) 22 - 32 mmol/L   Glucose, Bld 106 (H) 70 - 99 mg/dL   BUN 43 (H) 8 - 23 mg/dL   Creatinine, Ser 1.12 0.61 - 1.24 mg/dL   Calcium 9.1 8.9 - 10.3 mg/dL   Total Protein 7.0 6.5 - 8.1 g/dL   Albumin 2.9 (L) 3.5 - 5.0 g/dL   AST 36 15 - 41 U/L   ALT 28 0 - 44 U/L   Alkaline Phosphatase 60 38 - 126 U/L   Total Bilirubin 0.8 0.3 - 1.2 mg/dL   GFR, Estimated >60 >60 mL/min   Anion gap 10 5 - 15  TSH     Status:  None   Collection Time: 05/10/21  6:39 PM  Result Value Ref Range   TSH 3.127 0.350 - 4.500  uIU/mL  Magnesium     Status: None   Collection Time: 05/10/21  6:39 PM  Result Value Ref Range   Magnesium 2.0 1.7 - 2.4 mg/dL   Assessment and Plan: * AMS (altered mental status) Suspect acute CVA.  Pt is on eliquis which we will continue and Obtain MRI and MRA.  BP goal systolic of 431. Pt's prognosis is guarded as he is altered and bedridden.   Absent pedal pulses Doppler pulses. No family at bedside. Would discuss with family about plan of care.   Acute osteomyelitis of toe, right (HCC) We will continue patient on vancomycin and cefepime. Podiatry consulted- Dr.Evans per edmd. Pharmacy consulted for meds / levels and dosing.  Will defer to am team to d/w family about any intervention and putting pt  Thru Additional testing and eval after d/w family and vascular consult.    Stage 3a chronic kidney disease (CKD) ([place]) Lab Results  Component Value Date   CREATININE 1.12 05/10/2021   CREATININE 1.30 (H) 12/11/2020   CREATININE 1.29 (H) 12/06/2020  We will follow.  We will renally dose all meds.    Essential hypertension Blood pressure (!) 142/85, pulse 75, temperature 97.6 F (36.4 C), temperature source Axillary, resp. rate 18, height '5\' 7"'$  (1.702 m), weight 88.5 kg, SpO2 100 %. Hydralazine PRN SBP 200 goal. Continue statin.    Chronic diastolic congestive heart failure (HCC) Cont strict I/O. Cont statin.  Cont hydralazine PRN for permissive htn. Neurology consult per am team.   Chronic anticoagulation Cont Eliquis.  History of pulmonary embolism Cont Eliquis.   Elevated troponin level Troponin of  134./ ekg shows sr and mild twi in lateral leads.  Suspect NSTEMI.we will obtain echo.  With his h/o anemia we will cont eliquis wit no addition of additional Antiplatelet and anticoagulation.  Pt is also thrombocytopenic.  We will continue to cycle troponin and cardiology consult per am team. Last echo in 8/22: IMPRESSIONS Left ventricular ejection  fraction, by estimation, is 65 to 70%. The left ventricle has hyperdynamic function. The left ventricle has no regional wall motion abnormalities. Left ventricular diastolic parameters are consistent with Grade I diastolic dysfunction (impaired relaxation). 1. Right ventricular systolic function is normal. The right ventricular size is normal. Tricuspid regurgitation signal is inadequate for assessing PA pressure. 2. The mitral valve is normal in structure. No evidence of mitral valve regurgitation. No evidence of mitral stenosis. 3. The aortic valve is tricuspid. Aortic valve regurgitation is not visualized. No aortic stenosis is present. 4. The inferior vena cava is normal in size with greater than 50% respiratory variability, suggesting right atrial pressure of 3 mmHg.  Thrombocytopenia ([place]) CBC 05/10/2021 12/11/2020 12/06/2020  WBC 7.4 6.7 6.2  Hemoglobin 10.2(L) 10.2(L) 9.9(L)  Hematocrit 34.7(L) 32.4(L) 31.4(L)  Platelets 142(L) 161 176  New thrombocytopenia. We will follow.   Lactic acidosis Attribute to low bp on arrival. Pt does not meet SIRS. Cont monitoring.  Kidney function wnl.   Hypernatremia Pt.s serum sodium mildly elevated at 146 and hyperchloremia attribute to decreased po intake and dehydration.  and we will change ivf to LR  And free water flushes over next 24 hours.  Cont ivf to prevent any CIN.    Advance Care Planning:    Code Status: Full Code    Consults:  Dr. Amalia Hailey - Podiatry.  Family Communication:  Omoga,[name] (  Daughter)  [phone] ([place])  Severity of Illness: The appropriate patient status for this patient is INPATIENT. Inpatient status is judged to be reasonable and necessary in order to provide the required intensity of service to ensure the patient's safety. The patient's presenting symptoms, physical exam findings, and initial radiographic and laboratory data in the context of their chronic comorbidities is felt to place them at  high risk for further clinical deterioration. Furthermore, it is not anticipated that the patient will be medically stable for discharge from the hospital within 2 midnights of admission.   * I certify that at the point of admission it is my clinical judgment that the patient will require inpatient hospital care spanning beyond 2 midnights from the point of admission due to high intensity of service, high risk for further deterioration and high frequency of surveillance required.*  Author: Para Skeans, MD 05/10/2021 11:29 PM  For on call review [email or web address].

## 2021-05-10 NOTE — Assessment & Plan Note (Addendum)
Compensated 

## 2021-05-10 NOTE — Assessment & Plan Note (Addendum)
Creatinine elevated in the 120-130 range, flat trend.  No history of chest pain available.   ?ekg shows sr and mild twi in lateral leads.  ?Suspect demand ischemia.we will obtain echo.  ?Pt is also thrombocytopenic.  ?Last echo 8/22: LVEF 65-70%. ?

## 2021-05-10 NOTE — Assessment & Plan Note (Addendum)
All anticoagulants discontinued. ?

## 2021-05-10 NOTE — Assessment & Plan Note (Addendum)
Right hallux gangrene and osteomyelitis ?Podiatry/Dr. Amalia Hailey consultation appreciated ?Unable to complete MRI of lower extremity due to left sided contractures. ?As per podiatry recommendations, vascular surgery consulted to determine PAD status for any potential surgical intervention ?Suspects he will need at least a partial or total hallux amputation. ?As per vascular surgery, due to extreme left lower extremity contractures, unable to perform angiogram and recommend proceeding with surgery as needed.  ?Podiatry discussed with patient's daughter on 3/10 and surgery was canceled. ?As discussed with patient's daughter today, transitioned to comfort care, IV antibiotics discontinued. ?

## 2021-05-10 NOTE — Assessment & Plan Note (Addendum)
Suspected due to hypotension and poor tissue perfusion.  Serum lactate has fluctuated, peaked at 2.3 and down to 2. ?

## 2021-05-10 NOTE — Assessment & Plan Note (Signed)
Doppler pulses. ?No family at bedside. ?Would discuss with family about plan of care. ? ?

## 2021-05-10 NOTE — Assessment & Plan Note (Addendum)
Serum sodium has actually increased from 146-1 50. ?Likely related to poor oral intake/inadequate free water through PEG tube ?Resolved. ?Tube feeds including free water stopped. ?

## 2021-05-10 NOTE — ED Provider Notes (Signed)
[facility] Provider Note    Event Date/Time   First MD Initiated Contact with Patient 05/10/21 1455     (approximate)   History   femoral occlusion   HPI  Clifford [name] is a 71 y.o. 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  He is accompanied by his brother who provides most of the history.  First it seems that over the last 3 days patient has had significant change in mental status.  Brother reports that typically is able to carry conversation but has been most nonverbal and much more sleepy.  Mother also reports that over the last week he thinks his left-sided facial droop has reappeared which he had a prior stroke.  He does note he has some chronic left hemibody contractions at 1/95 usual.  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  He states his brother has trouble with arterial issues in the leg for many years but that they were never able to do any procedures or surgical interventions due to smoking and, babies.  He states that he feels that since the patient not been smoking for several months that he would like to get something done about this.  He has states nail of the first toe came off a couple days ago.  He is not sure how this happened.  No other sick symptoms for as he is aware such as falls, fever, cough or vomiting.  Patient is unable find a history also had altered mental status.      Physical Exam  Triage Vital Signs: ED Triage Vitals  Enc Vitals Group     BP      Pulse      Resp      Temp      Temp src      SpO2      Weight      Height      Head Circumference      Peak Flow      Pain Score      Pain Loc      Pain Edu?       Excl. in [place]?     Most recent vital signs: Vitals:   05/10/21 1645 05/10/21 1800  BP: 123/76 136/76  Pulse: 74 75  Resp: 18 17  Temp:    SpO2:  100%    General: Awake, ill-appearing. CV:  Good peripheral perfusion.  2+ radial pulses Resp:  Normal effort.  Clear bilaterally Abd:  No distention.  Soft. Other:  Pupils are equally round and reactive to light.  Patient does not follow any commands.  Left upper and lower extremities are contracted.  Patient does withdrawal to noxious stimuli in all extremities.  Seem to have more normal tone in the right hemibody.  His right first toe has some black discoloration but otherwise the foot is warm.  No other obvious areas of induration, erythema or streaking.  Was able to Doppler PT pulse in the right foot.  Diminished palpable pulses in the left lower extremity.   ED Results / Procedures / Treatments  Labs (all labs ordered are listed, but only abnormal results  are displayed) Labs Reviewed  PROTIME-INR - Abnormal; Notable for the following components:      Result Value   Prothrombin Time 19.5 (*)    INR 1.7 (*)    All other components within normal limits  APTT - Abnormal; Notable for the following components:   aPTT 55 (*)    All other components within normal limits  CBC - Abnormal; Notable for the following components:   RBC 4.00 (*)    Hemoglobin 10.2 (*)    HCT 34.7 (*)    MCH 25.5 (*)    MCHC 29.4 (*)    Platelets 142 (*)    All other components within normal limits  SEDIMENTATION RATE - Abnormal; Notable for the following components:   Sed Rate 93 (*)    All other components within normal limits  BLOOD GAS, VENOUS - Abnormal; Notable for the following components:   Bicarbonate 28.2 (*)    All other components within normal limits  RESP PANEL BY RT-PCR (FLU A&B, COVID) ARPGX2  CULTURE, BLOOD (ROUTINE X 2)  CULTURE, BLOOD (ROUTINE X 2)  DIFFERENTIAL  AMMONIA  LACTIC ACID, PLASMA  COMPREHENSIVE METABOLIC PANEL  URINE  DRUG SCREEN, QUALITATIVE ([facility] ONLY)  TSH  LACTIC ACID, PLASMA  C-REACTIVE PROTEIN  PROCALCITONIN  URINALYSIS, ROUTINE W REFLEX MICROSCOPIC  TROPONIN I (HIGH SENSITIVITY)     EKG  ECG is remarkable sinus rhythm with a ventricular rate of 64, normal axis, unremarkable intervals with nonspecific change in lead III without other clear evidence of acute ischemia or significant arrhythmia.   RADIOLOGY  CT abdomen interpretation without evidence of hemorrhage.  There is sequelae of patient's prior right-sided pulmonary parietal stroke possibly new hypodensity in the right thalamus..  Also reviewed radiology interpretation and agree with their findings of same.  X-ray of the right foot shows osteomyelitis of the right distal phalanx left first toe.  No other acute fracture dislocation or other areas of acute osteomyelitis.   PROCEDURES:  Critical Care performed: No  .1-3 Lead EKG Interpretation Performed by: Lucrezia Starch, MD Authorized by: Lucrezia Starch, MD     Interpretation: normal     ECG rate assessment: normal     Rhythm: sinus rhythm     Ectopy: none     Conduction: normal    The patient is on the cardiac monitor to evaluate for evidence of arrhythmia and/or significant heart rate changes.   MEDICATIONS ORDERED IN ED: Medications  lactated ringers bolus 1,000 mL (has no administration in time range)  vancomycin (VANCOREADY) IVPB 2000 mg/400 mL (has no administration in time range)  ceFEPIme (MAXIPIME) 2 g in sodium chloride 0.9 % 100 mL IVPB (2 g Intravenous New Bag/Given 05/10/21 1741)     IMPRESSION / MDM / ASSESSMENT AND PLAN / ED COURSE  I reviewed the triage vital signs and the nursing notes.                               Patient presents with what seems to be 3 separate issues.  First with regard to altered mental status differential considerations include repeat CVA, metabolic derangements, endocrine derangements, polypharmacy, with low suspicion  based on history and exam for trauma or toxic ingestion.  CT abdomen interpretation without evidence of hemorrhage.  There is sequelae of patient's prior right-sided pulmonary parietal stroke possibly new hypodensity in the right thalamus..  Also reviewed radiology interpretation and agree with their findings  of same.  Second, with regard to apparent wound and discoloration of first toe I am concerned for cellulitis versus osteomyelitis in the setting of a history of PVD.  Third based on the report brought with patient showing an ultrasound study of the bilateral lower extremities with conclusion of "moderate to severe peripheral vascular disease changes with findings of occlusive right femoral artery".  This was obtained on 3/14.  Patient's foot is currently warm with very diminished pulses.  I have low suspicion for occlusive arterial occlusion although it certainly seems this is consistent with history of fairly significant and diffuse peripheral vascular disease possibly progressing.  INR is 1.7.  PTT is 55.  VBG with a pH of 7.36 with a PCO2 of 50 and a bicarb of 20.2 not suggestive of a primary hypercarbic acidosis contributing to his encephalopathy today.  CBC shows WBC count of 7.4 and hemoglobin of 10.2 compared to 10.255 months ago.  Platelets are 142.  Lactic acid 1.8.  TSH is 3.8.  Ammonia is 14.  CMP initially hemolyzed aside from potassium also obtain a creatinine of 1.23, albumin 3.7, ALT of 27, alk phos of 49, BUN of 46, calcium of 9, chloride of 114, CO2 of 26, glucose of 119, sodium of 147, and bilirubin of 1.7.  X-ray of the right foot shows osteomyelitis of the right distal phalanx left first toe.  No other acute fracture dislocation or other areas of acute osteomyelitis.  Given concern for acute osteomyelitis will obtain blood cultures and order an ESR and CRP in addition to basic labs and start patient on broad-spectrum antibiotics.  ESR is 93.  Given concern for acute  osteomyelitis I discussed patient with on-call podiatrist Dr. Amalia Hailey who recommended MRI of the foot and noted an podiatry provider will evaluate the patient tomorrow morning for possible procedure.  Given concern for an acute to subacute stroke we will also order MRI of the brain as well as MR a of the head and neck.  Do not believe patient is a candidate for any tPA or acute intervention as it seems symptoms have been ongoing for at least 3 days.  I will admit to medicine service.  I discussed this with admitting hospitalist.  I do not believe patient is septic at this time.  We will start IV fluids for some mild dehydration while patient gets his broad-spectrum antibiotics.      FINAL CLINICAL IMPRESSION(S) / ED DIAGNOSES   Final diagnoses:  Altered mental status, unspecified altered mental status type  Other acute osteomyelitis of right foot ([place])  Anticoagulated  PVD (peripheral vascular disease) ([place])     Rx / DC Orders   ED Discharge Orders     None        Note:  This document was prepared using Dragon voice recognition software and may include unintentional dictation errors.   Lucrezia Starch, MD 05/10/21 [phone]

## 2021-05-10 NOTE — Assessment & Plan Note (Addendum)
Resolved

## 2021-05-10 NOTE — ED Notes (Signed)
MRI stated that they did not get the MRI due to pt left leg being bent. ?

## 2021-05-10 NOTE — ED Notes (Signed)
Critical LAB LACTIC 2.3  ? ?MD made aware ?

## 2021-05-10 NOTE — Consult Note (Signed)
PHARMACY -  BRIEF ANTIBIOTIC NOTE  ? ?Pharmacy has received consult(s) for vancomycin and cefepime from an ED provider.  The patient's profile has been reviewed for ht/wt/allergies/indication/available labs.   ? ?One time order(s) placed for  ?Cefepime 2 gram ?Vancomycin 2 gram  ?Further antibiotics/pharmacy consults should be ordered by admitting physician if indicated.       ?                ?Thank you, ?Dorothe Pea, PharmD, BCPS ?Clinical Pharmacist   ?05/10/2021  3:53 PM  ?

## 2021-05-10 NOTE — Assessment & Plan Note (Addendum)
All anticoagulation now discontinued. ?

## 2021-05-10 NOTE — Assessment & Plan Note (Addendum)
Controlled.  

## 2021-05-10 NOTE — Assessment & Plan Note (Addendum)
Unclear if he actually has stage II CKD or stage IIIa. ?Creatinine normal. ?

## 2021-05-10 NOTE — ED Notes (Addendum)
RN attempted x 2 for IV/blood work unsuccessfully. Medic attempted x1 and again with ultrasound. No success. IV consult placed at this time. Provider aware ?

## 2021-05-11 ENCOUNTER — Inpatient Hospital Stay: Payer: No Typology Code available for payment source

## 2021-05-11 ENCOUNTER — Other Ambulatory Visit (INDEPENDENT_AMBULATORY_CARE_PROVIDER_SITE_OTHER): Payer: Self-pay | Admitting: Nurse Practitioner

## 2021-05-11 ENCOUNTER — Encounter: Payer: Self-pay | Admitting: Internal Medicine

## 2021-05-11 DIAGNOSIS — Z515 Encounter for palliative care: Secondary | ICD-10-CM | POA: Diagnosis not present

## 2021-05-11 DIAGNOSIS — I739 Peripheral vascular disease, unspecified: Secondary | ICD-10-CM

## 2021-05-11 DIAGNOSIS — M86171 Other acute osteomyelitis, right ankle and foot: Secondary | ICD-10-CM | POA: Diagnosis not present

## 2021-05-11 DIAGNOSIS — R627 Adult failure to thrive: Secondary | ICD-10-CM

## 2021-05-11 DIAGNOSIS — Z7189 Other specified counseling: Secondary | ICD-10-CM

## 2021-05-11 DIAGNOSIS — D649 Anemia, unspecified: Secondary | ICD-10-CM

## 2021-05-11 DIAGNOSIS — R4182 Altered mental status, unspecified: Secondary | ICD-10-CM | POA: Diagnosis not present

## 2021-05-11 LAB — PHOSPHORUS: Phosphorus: 3.5 mg/dL (ref 2.5–4.6)

## 2021-05-11 LAB — CBC
HCT: 30.2 % — ABNORMAL LOW (ref 39.0–52.0)
Hemoglobin: 8.9 g/dL — ABNORMAL LOW (ref 13.0–17.0)
MCH: 25.4 pg — ABNORMAL LOW (ref 26.0–34.0)
MCHC: 29.5 g/dL — ABNORMAL LOW (ref 30.0–36.0)
MCV: 86 fL (ref 80.0–100.0)
Platelets: 128 10*3/uL — ABNORMAL LOW (ref 150–400)
RBC: 3.51 MIL/uL — ABNORMAL LOW (ref 4.22–5.81)
RDW: 15.3 % (ref 11.5–15.5)
WBC: 8.5 10*3/uL (ref 4.0–10.5)
nRBC: 0 % (ref 0.0–0.2)

## 2021-05-11 LAB — LACTIC ACID, PLASMA
Lactic Acid, Venous: 1.3 mmol/L (ref 0.5–1.9)
Lactic Acid, Venous: 2 mmol/L (ref 0.5–1.9)

## 2021-05-11 LAB — BASIC METABOLIC PANEL
Anion gap: 4 — ABNORMAL LOW (ref 5–15)
BUN: 39 mg/dL — ABNORMAL HIGH (ref 8–23)
CO2: 24 mmol/L (ref 22–32)
Calcium: 9 mg/dL (ref 8.9–10.3)
Chloride: 122 mmol/L — ABNORMAL HIGH (ref 98–111)
Creatinine, Ser: 1.18 mg/dL (ref 0.61–1.24)
GFR, Estimated: >60 mL/min (ref >60)
Glucose, Bld: 104 mg/dL — ABNORMAL HIGH (ref 70–99)
Potassium: 4 mmol/L (ref 3.5–5.1)
Sodium: 150 mmol/L — ABNORMAL HIGH (ref 135–145)

## 2021-05-11 LAB — GLUCOSE, CAPILLARY
Glucose-Capillary: 85 mg/dL (ref 70–99)
Glucose-Capillary: 100 mg/dL — ABNORMAL HIGH (ref 70–99)

## 2021-05-11 LAB — MAGNESIUM: Magnesium: 1.9 mg/dL (ref 1.7–2.4)

## 2021-05-11 LAB — TROPONIN I (HIGH SENSITIVITY)
Troponin I (High Sensitivity): 122 ng/L (ref <18)
Troponin I (High Sensitivity): 121 ng/L (ref <18)

## 2021-05-11 LAB — BRAIN NATRIURETIC PEPTIDE: B Natriuretic Peptide: 150.5 pg/mL — ABNORMAL HIGH (ref 0.0–100.0)

## 2021-05-11 LAB — HEPARIN LEVEL (UNFRACTIONATED): Heparin Unfractionated: >1.1 IU/mL — ABNORMAL HIGH (ref 0.30–0.70)

## 2021-05-11 MED ORDER — FREE WATER
155.0000 mL | Status: DC
  Administered 2021-05-11 – 2021-05-14 (×14): 155 mL

## 2021-05-11 MED ORDER — FREE WATER: 200.0000 mL | Status: DC

## 2021-05-11 MED ORDER — PROSOURCE TF PO LIQD
45.0000 mL | Freq: Every day | ORAL | Status: DC
  Administered 2021-05-11 – 2021-05-14 (×3): 45 mL
  Filled 2021-05-11 (×4): qty 45

## 2021-05-11 MED ORDER — DEXTROSE 5 % IV SOLN
INTRAVENOUS | Status: DC
Start: 2021-05-11 — End: 2021-05-12

## 2021-05-11 MED ORDER — IOHEXOL 350 MG/ML SOLN: 125.0000 mL | Freq: Once | INTRAVENOUS | Status: DC | PRN

## 2021-05-11 MED ORDER — OSMOLITE 1.5 CAL PO LIQD
1000.0000 mL | ORAL | Status: DC
  Administered 2021-05-11 – 2021-05-14 (×5): 1000 mL

## 2021-05-11 MED ORDER — HEPARIN BOLUS VIA INFUSION
4200.0000 [IU] | Freq: Once | INTRAVENOUS | Status: AC
  Administered 2021-05-11: 4200 [IU] via INTRAVENOUS
  Filled 2021-05-11: qty 4200

## 2021-05-11 MED ORDER — VITAL HIGH PROTEIN PO LIQD: 1000.0000 mL | ORAL | Status: DC

## 2021-05-11 MED ORDER — SODIUM CHLORIDE 0.9 % IV SOLN
2.0000 g | Freq: Three times a day (TID) | INTRAVENOUS | Status: DC
  Administered 2021-05-11 – 2021-05-14 (×9): 2 g via INTRAVENOUS
  Filled 2021-05-11 (×12): qty 2

## 2021-05-11 MED ORDER — HEPARIN (PORCINE) 25000 UT/250ML-% IV SOLN
1300.0000 [IU]/h | INTRAVENOUS | Status: DC
  Administered 2021-05-11: 1300 [IU]/h via INTRAVENOUS
  Filled 2021-05-11: qty 250

## 2021-05-11 MED ORDER — VANCOMYCIN HCL 1500 MG/300ML IV SOLN
1500.0000 mg | INTRAVENOUS | Status: DC
  Administered 2021-05-11 – 2021-05-13 (×3): 1500 mg via INTRAVENOUS
  Filled 2021-05-11 (×4): qty 300

## 2021-05-11 NOTE — Consult Note (Signed)
[facility] Consult Note  MRN : [id number]  Clifford [name] is a 71 y.o. (07-09-1950) male who presents with chief complaint of  Chief Complaint  Patient presents with   femoral occlusion  .  Reason for Consult: Gangrene right great toe with history of PAD Referring Physician: Dr Algis Liming  History of Present Illness: Clifford [name] is a 71 year old male that presented to [facility] on 05/10/2021 due to altered mental status.  The patient has left-sided weakness following history of strokes.  The left lower extremity does have a contracture.  There is also a wound on the right great toe with gangrenous changes.  Recent x-ray showed concern for osteomyelitis.  The patient is currently awake however he is not responding to questions or commands.  Much of the history is provided by the brother that is at bedside.  The patient's brother notes that he has a history of peripheral arterial disease and only was previously in [place], his doctor would not allow an angiogram to be done because he continues to smoke.  The brother notes that he has not smoked in approximately 6 months.  He notes that the wound has improved only slightly , after some recent wound care was done at his nursing facility.  Current Facility-Administered Medications  Medication Dose Route Frequency Provider Last Rate Last Admin   acetaminophen (TYLENOL) tablet 650 mg  650 mg Oral Q6H PRN Para Skeans, MD       Or   acetaminophen (TYLENOL) suppository 650 mg  650 mg Rectal Q6H PRN Para Skeans, MD       atorvastatin (LIPITOR) tablet 40 mg  40 mg Per Tube QHS Florina Ou V, MD   40 mg at 05/10/21 2325   ceFEPIme (MAXIPIME) 2 g in sodium chloride 0.9 % 100 mL IVPB  2 g Intravenous Q8H BelueAlver Sorrow, RPH 200 mL/hr at 05/11/21 1426 2 g at 05/11/21 1426   famotidine (PEPCID) 40 MG/5ML suspension 20 mg  20 mg Per Tube BID Florina Ou V, MD   20 mg at 05/11/21 0946   free water 100 mL  100  mL Per Tube Q4H Florina Ou V, MD   100 mL at 05/11/21 1330   hydrALAZINE (APRESOLINE) injection 10 mg  10 mg Intravenous Q6H PRN Para Skeans, MD       levETIRAcetam (KEPPRA) 100 MG/ML solution 250 mg  250 mg Per Tube BID Florina Ou V, MD   250 mg at 05/11/21 0944   pantoprazole sodium (PROTONIX) 40 mg/20 mL oral suspension 40 mg  40 mg Per Tube BID Florina Ou V, MD   40 mg at 05/11/21 0946   QUEtiapine (SEROQUEL) tablet 25 mg  25 mg Per Tube QHS Para Skeans, MD   25 mg at 05/10/21 2325   topiramate (TOPAMAX) tablet 50 mg  50 mg Per Tube BID Para Skeans, MD   50 mg at 05/11/21 0945   vancomycin (VANCOREADY) IVPB 1500 mg/300 mL  1,500 mg Intravenous Q24H Renda Rolls, [facility]        Past Medical History:  Diagnosis Date   BPH (benign prostatic hyperplasia)    Chronic anticoagulation 10/14/2020   Essential hypertension 10/06/2020   History of pulmonary embolism 10/14/2020   HLD (hyperlipidemia)    HTN (hypertension)    Lung nodule    62m in 2015   Malignant neoplasm of left kidney ([place]    Mixed hyperlipidemia 10/06/2020  Nicotine dependence, cigarettes, uncomplicated 05/27/4008   Peripheral vascular disease ([place])    Polyp of colon    Prediabetes 10/14/2020   Pulmonary embolus ([place])    Stroke ([place]) 10/06/2020    Past Surgical History:  Procedure Laterality Date   COLONOSCOPY  03/2017   IR GASTROSTOMY TUBE MOD SED  11/02/2020   PARTIAL NEPHRECTOMY Left 02/2018   PROSTATE BIOPSY  2015    Social History Social History   Tobacco Use   Smoking status: Every Day    Packs/day: 0.50    Types: Cigarettes   Smokeless tobacco: Never  Vaping Use   Vaping Use: Never used  Substance Use Topics   Alcohol use: Never   Drug use: Never    Family History Family History  Problem Relation Age of Onset   Clotting disorder Neg Hx     Allergies  Allergen Reactions   Apixaban     Other reaction(s): Liver enzymes abnormal   Lisinopril     Other reaction(s): Cough (finding)    Simvastatin     Other reaction(s): Pain in lower limb (finding)     REVIEW OF SYSTEMS (Negative unless checked)  Review of systems provided by brother  Constitutional: '[]'$ Weight loss  '[]'$ Fever  '[]'$ Chills Cardiac: '[]'$ Chest pain   '[]'$ Chest pressure   '[]'$ Palpitations   '[]'$ Shortness of breath when laying flat   '[]'$ Shortness of breath at rest   '[]'$ Shortness of breath with exertion. Vascular:  '[]'$ Pain in legs with walking   '[]'$ Pain in legs at rest   '[]'$ Pain in legs when laying flat   '[]'$ Claudication   '[]'$ Pain in feet when walking  '[]'$ Pain in feet at rest  '[]'$ Pain in feet when laying flat   '[]'$ History of DVT   '[]'$ Phlebitis   '[]'$ Swelling in legs   '[]'$ Varicose veins   '[x]'$ Non-healing ulcers Pulmonary:   '[]'$ Uses home oxygen   '[]'$ Productive cough   '[]'$ Hemoptysis   '[]'$ Wheeze  '[]'$ COPD   '[]'$ Asthma Neurologic:  '[]'$ Dizziness  '[]'$ Blackouts   '[]'$ Seizures   '[x]'$ History of stroke   '[]'$ History of TIA  '[]'$ Aphasia   '[]'$ Temporary blindness   '[]'$ Dysphagia   '[]'$ Weakness or numbness in arms   '[x]'$ Weakness or numbness in legs Musculoskeletal:  '[]'$ Arthritis   '[]'$ Joint swelling   '[]'$ Joint pain   '[]'$ Low back pain Hematologic:  '[]'$ Easy bruising  '[]'$ Easy bleeding   '[]'$ Hypercoagulable state   '[]'$ Anemic  '[]'$ Hepatitis Gastrointestinal:  '[]'$ Blood in stool   '[]'$ Vomiting blood  '[]'$ Gastroesophageal reflux/heartburn   '[]'$ Difficulty swallowing. Genitourinary:  '[]'$ Chronic kidney disease   '[]'$ Difficult urination  '[]'$ Frequent urination  '[]'$ Burning with urination   '[]'$ Blood in urine Skin:  '[]'$ Rashes   '[]'$ Ulcers   '[]'$ Wounds Psychological:  '[]'$ History of anxiety   '[]'$  History of major depression.  Physical Examination  Vitals:   05/11/21 0158 05/11/21 0411 05/11/21 0719 05/11/21 1138  BP: 109/66 138/74 (!) 147/71 132/82  Pulse: 67 71 70 84  Resp: '16 14 16 16  '$ Temp: 98.8 F (37.1 C) 98.1 F (36.7 C) 97.9 F (36.6 C) 98.1 F (36.7 C)  TempSrc: Oral Oral Oral Oral  SpO2: 100% 100% 100% 100%  Weight:      Height:       Body mass index is 30.54 kg/m. Gen:  WD/WN, NAD, cachectic  appearance Head: [place]/AT, No temporalis wasting. Prominent temp pulse not noted. Ear/Nose/Throat: Hearing grossly intact, nares w/o erythema or drainage, oropharynx w/o Erythema/Exudate Eyes: Sclera non-icteric, conjunctiva clear Neck: Trachea midline.  No JVD.  Pulmonary:  Good air movement, respirations not labored, equal bilaterally.  Cardiac: RRR, normal S1, S2. Vascular: Faint dopplerable pulses of DP/PT right lower extremity  Musculoskeletal: Left-sided hemiplegia with left lower extremity contracture Neurologic: Altered mental status Psychiatric: Alert to family Dermatologic: See attached picture of wound    CBC Lab Results  Component Value Date   WBC 8.5 05/11/2021   HGB 8.9 (L) 05/11/2021   HCT 30.2 (L) 05/11/2021   MCV 86.0 05/11/2021   PLT 128 (L) 05/11/2021    BMET    Component Value Date/Time   NA 150 (H) 05/11/2021 0507   K 4.0 05/11/2021 0507   CL 122 (H) 05/11/2021 0507   CO2 24 05/11/2021 0507   GLUCOSE 104 (H) 05/11/2021 0507   BUN 39 (H) 05/11/2021 0507   CREATININE 1.18 05/11/2021 0507   CALCIUM 9.0 05/11/2021 0507   GFRNONAA >60 05/11/2021 0507   Estimated Creatinine Clearance: 60.1 mL/min (by C-G formula based on SCr of 1.18 mg/dL).  COAG Lab Results  Component Value Date   INR 1.7 (H) 05/10/2021   INR 1.2 11/02/2020   INR 1.4 (H) 10/14/2020    Radiology CT ANGIO HEAD NECK W WO CM  Result Date: 05/11/2021 CLINICAL DATA:  Initial evaluation for neuro deficit, stroke suspected. EXAM: CT ANGIOGRAPHY HEAD AND NECK TECHNIQUE: Multidetector CT imaging of the head and neck was performed using the standard protocol during bolus administration of intravenous contrast. Multiplanar CT image reconstructions and MIPs were obtained to evaluate the vascular anatomy. Carotid stenosis measurements (when applicable) are obtained utilizing NASCET criteria, using the distal internal carotid diameter as the denominator. RADIATION DOSE REDUCTION: This exam was  performed according to the departmental dose-optimization program which includes automated exposure control, adjustment of the mA and/or kV according to patient size and/or use of iterative reconstruction technique. CONTRAST:  90m OMNIPAQUE IOHEXOL 350 MG/ML SOLN COMPARISON:  Comparison made with prior head CT from earlier the same day. FINDINGS: CTA NECK FINDINGS Aortic arch: Visualized aortic arch normal caliber with normal branch pattern. Mild atheromatous change about the arch itself. No hemodynamically significant stenosis about the origin of the great vessels. Right carotid system: Right CCA tortuous but widely patent to the bifurcation. There is abrupt occlusion of the proximal cervical right ICA just distal to the bifurcation, likely chronic in nature given the large chronic right cerebral infarct seen on prior CT. Right ICA remains occluded to the skull base. Left carotid system: Left common and internal carotid arteries are somewhat tortuous but widely patent without stenosis or dissection. Vertebral arteries: Both vertebral arteries arise from the subclavian arteries. No proximal subclavian artery stenosis. Proximal left vertebral artery not well seen due to adjacent venous contamination. Visualized portions of the vertebral arteries patent without stenosis or dissection. Skeleton: Visualized osseous structures are somewhat heterogeneous without definite discrete lytic or blastic osseous lesion. Mild-to-moderate multilevel cervical spondylosis without high-grade spinal stenosis. Patient is edentulous. Other neck: No other acute soft tissue abnormality within the neck. 6 mm right thyroid nodule noted, of doubtful significance given size and patient age, no follow-up imaging recommended (ref: J Am Coll Radiol. 2015 Feb;12(2): 143-50). Upper chest: Visualized upper chest demonstrates no acute finding. Review of the MIP images confirms the above findings CTA HEAD FINDINGS Anterior circulation: Petrous left  ICA widely patent. Atheromatous change within the left carotid siphon without hemodynamically significant stenosis. Right ICA remains occluded to the terminus. A1 segments patent bilaterally. Right A1 hypoplastic. Normal anterior communicating artery complex. Both anterior cerebral arteries widely patent to their distal aspects. Left M1 widely patent.  Normal left MCA bifurcation. Distal left MCA branches widely patent. Perfusion of the right M1 segment via collateral flow across the circle-of-Willis. Right M1 is somewhat diminutive with probable short-segment moderate stenosis at its distal aspect (series 9, image 53). Normal right MCA bifurcation. Right MCA branches attenuated but grossly patent distally. Posterior circulation: Both vertebral arteries patent without hemodynamically significant stenosis. Both PICA patent. Basilar patent to its distal aspect without stenosis. Superior cerebellar and posterior cerebral arteries widely patent bilaterally. Venous sinuses: Patent allowing for timing the contrast bolus. Anatomic variants: None significant.  No aneurysm. Review of the MIP images confirms the above findings IMPRESSION: 1. 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 Right ICA remains occluded to the terminus. Right MCA and its branches are attenuated but perfused via collateral flow across the circle-of-Willis. 2. Mild atheromatous change about the left carotid bifurcation and left carotid siphon, but no hemodynamically significant or correctable stenosis. 3. Diffuse tortuosity of the major arterial vasculature of the head and neck, suggesting chronic underlying hypertension. Electronically Signed   By: Jeannine Boga M.D.   On: 05/11/2021 00:11   CT HEAD WO CONTRAST (5MM)  Result Date: 05/10/2021 CLINICAL DATA:  Mental status change.  History of stroke EXAM: CT HEAD WITHOUT CONTRAST TECHNIQUE: Contiguous axial images were obtained from the base of the skull through the  vertex without intravenous contrast. RADIATION DOSE REDUCTION: This exam was performed according to the departmental dose-optimization program which includes automated exposure control, adjustment of the mA and/or kV according to patient size and/or use of iterative reconstruction technique. COMPARISON:  CT head 11/16/2020 FINDINGS: Brain: Encephalomalacia in the for right frontal parietal and temporal lobe compatible with chronic infarct. Dystrophic calcification in the infarct has developed since the prior study. There is also chronic infarct involving the right occipital lobe. Territory similar to the prior study. New area of hypodensity right thalamus and right splenium. Negative for acute   hemorrhage, mass.  Negative for hydrocephalus. Vascular: Negative for hyperdense vessel Skull: No focal abnormality Sinuses/Orbits: Paranasal sinuses clear.  Negative orbit Other: None IMPRESSION: Chronic right MCA infarct. Hypodensity in the right thalamus and splenium of the corpus callosum. This was not identified on the prior MRI 10/28/2020 and could be a new area of infarct. Negative for hemorrhage. Electronically Signed   By: Franchot Gallo M.D.   On: 05/10/2021 15:39   DG Foot Complete Right  Result Date: 05/10/2021 CLINICAL DATA:  Concern for osteomyelitis of the right great toe EXAM: RIGHT FOOT COMPLETE - 3+ VIEW COMPARISON:  None. FINDINGS: Erosive changes along the lateral margin of the tuft of the great toe distal phalanx compatible with acute osteomyelitis. No additional sites of focal bone erosion are seen. No fracture or dislocation. Degenerative changes are most pronounced at the first and second MTP joints. There is soft tissue swelling about the foot. IMPRESSION: Acute osteomyelitis of the right great toe distal phalanx. Electronically Signed   By: Davina Poke D.O.   On: 05/10/2021 15:29      Assessment/Plan 1.  The noninvasive imaging previously done was of left lower extremity which showed  that the femoral occlusion however based on the exam of the gangrenous changes to the right great toe with the diminished dopplerable pulses, it would be prudent to proceed with angiogram of the right lower extremity.  As far as the known femoral occlusion in the left lower extremity, this can be addressed following revascularization of the right lower extremity.  Given that  the patient's left lower extremity is contracted it would be difficult for endovascular intervention, if not impossible.  I discussed the risk benefits and alternatives with the patient's brother currently at bedside who agrees to proceed.   Kris Hartmann, NP  05/11/2021 3:04 PM    This note was created with Dragon medical transcription system.  Any error is purely unintentional

## 2021-05-11 NOTE — Progress Notes (Signed)
Pharmacy Antibiotic Note ? ?Clifford [name] is a 71 y.o. male admitted on 05/10/2021 with osteomyelitis of R toe.  Pharmacy has been consulted for Cefepime and Vancomycin dosing. ? ?Plan: ?Cefepime 2 gm q8h per indication and renal fxn. ? ?Vancomycin ?Pt given initial dose of Vancomycin 2 gm ?Vancomycin 1500 mg IV Q 24 hrs.  ?Goal AUC 400-550. ?Expected AUC: 464.6 ?SCr used: 1.12 ? ?Pharmacy will continue to follow and will adjust abx dosing whenever warranted. ? ? ?Height: '5\' 7"'$  (170.2 cm) ?Weight: 88.5 kg (195 lb) ?IBW/kg (Calculated) : 66.1 ? ?Temp (24hrs), Avg:97.8 ?F (36.6 ?C), Min:97.6 ?F (36.4 ?C), Max:97.9 ?F (36.6 ?C) ? ?Recent Labs  ?Lab 05/10/21 ?1521 05/10/21 ?1839  ?WBC 7.4  --   ?CREATININE  --  1.12  ?LATICACIDVEN 1.8 2.3*  ?  ?Estimated Creatinine Clearance: 63.3 mL/min (by C-G formula based on SCr of 1.12 mg/dL).   ? ?Allergies  ?Allergen Reactions  ? Apixaban   ?  Other reaction(s): Liver enzymes abnormal  ? Lisinopril   ?  Other reaction(s): Cough (finding)  ? Simvastatin   ?  Other reaction(s): Pain in lower limb (finding)  ? ? ?Antimicrobials this admission: ?3/7 Cefepime >>  ?3/7 Vancomycin >>  ? ?Microbiology results: ?3/7 BCx: Pending ? ?Thank you for allowing pharmacy to be a part of this patient?s care. ? ?Renda Rolls, PharmD, MBA ?05/11/2021 ?1:11 AM ? ? ?

## 2021-05-11 NOTE — Assessment & Plan Note (Addendum)
As per triage notes, presented to the ED via EMS from [facility] for right femoral artery occlusion ?Imaging was done 3/4 but did not receive results until day of ED visit. ?Vascular surgery/Dr. Lucky Cowboy consultation appreciated.  Reportedly noninvasive imaging previously done was of left lower extremity which showed femoral occlusion but current gangrenous changes were of right first toe. ?As noted above, unable to perform angiogram due to severe left lower extremity contractures. ?

## 2021-05-11 NOTE — TOC Initial Note (Signed)
Transition of Care (TOC) - Initial/Assessment Note  ? ? ?Patient Details  ?Name: Clifford [name] ?MRN: [id number] ?Date of Birth: 05-May-1950 ? ?Transition of Care (TOC) CM/SW Contact:    ?Wendelin Bradt A Julietta Batterman, LCSW ?Phone Number: ?05/11/2021, 10:10 AM ? ?Clinical Narrative:    CSW spoke with pt's daughter [name]. Pt's daughter states pt was at [facility] place for a short amount of time and then transition to [facility] for long term placement. Plan per pt's daughter is that pt will return to [facility] at d/c. CSW will send the SNF the clinicals needed.      ? ?[facility] makes sure pt get to all outside appointments while doctor rounds on pt at snf. Pt uses pharmacy at SNF.          ? ? ?Expected Discharge Plan: [place] ?Barriers to Discharge: Continued Medical Work up ? ? ?Patient Goals and CMS Choice ?Patient states their goals for this hospitalization and ongoing recovery are:: for pt to return to [facility] ?  ?Choice offered to / list presented to : Adult Children ? ?Expected Discharge Plan and Services ?Expected Discharge Plan: [place] ?In-house Referral: Clinical Social Work ?  ?Post Acute Care Choice: [place] ?Living arrangements for the past 2 months: [place] ?                ?  ?  ?  ?  ?  ?  ?  ?  ?  ?  ? ?Prior Living Arrangements/Services ?Living arrangements for the past 2 months: [place] ?Lives with:: Facility Resident ?Patient language and need for interpreter reviewed:: Yes ?Do you feel safe going back to the place where you live?: Yes      ?Need for Family Participation in Patient Care: Yes (Comment) ?Care giver support system in place?: Yes (comment) ?  ?Criminal Activity/Legal Involvement Pertinent to Current Situation/Hospitalization: No - Comment as needed ? ?Activities of Daily Living ?  ?  ? ?Permission Sought/Granted ?Permission sought to share information with : Family Supports ?Permission granted to  share information with : Yes, Release of Information Signed ? Share Information with NAME: [name] ? Permission granted to share info w AGENCY: [facility] ? Permission granted to share info w Relationship: daughter ?   ? ?Emotional Assessment ?Appearance:: Appears stated age ?Attitude/Demeanor/Rapport: Unable to Assess ?Affect (typically observed): Unable to Assess ?Orientation: :  (unable to determine, non verbal) ?Alcohol / Substance Use: Not Applicable ?Psych Involvement: No (comment) ? ?Admission diagnosis:  PVD (peripheral vascular disease) ([place]) [I73.9] ?Anticoagulated [Z79.01] ?Altered mental status, unspecified altered mental status type [R41.82] ?Other acute osteomyelitis of right foot ([place]) [M86.171] ?AMS (altered mental status) [R41.82] ?Patient Active Problem List  ? Diagnosis Date Noted  ? AMS (altered mental status) 05/10/2021  ? Absent pedal pulses 05/10/2021  ? Acute osteomyelitis of toe, right ([place]) 05/10/2021  ? Hypernatremia 05/10/2021  ? Lactic acidosis 05/10/2021  ? Thrombocytopenia ([place]) 05/10/2021  ? Elevated troponin level 05/10/2021  ? 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 Left-sided neglect 12/14/2020  ? Dysphagia 12/14/2020  ? Nodule of upper lobe of left lung-8 mm 12/14/2020  ? Seizure-like activity ([place]) 12/14/2020  ? Stage 3a chronic kidney disease (CKD) ([place]) 12/14/2020  ? Chronic diastolic congestive heart failure ([place]) 12/14/2020  ? History of recurrent deep vein thrombosis (DVT) 12/14/2020  ? Advanced care planning/counseling discussion   ? Goals of care, counseling/discussion   ?  Palliative care by specialist   ? Left-sided weakness 10/14/2020  ? Prediabetes 10/14/2020  ? History of pulmonary embolism 10/14/2020  ? Chronic anticoagulation 10/14/2020  ? Nicotine dependence, cigarettes, uncomplicated 26/37/8588  ? Petechial hemorrhage 10/14/2020  ? Acute stroke due to ischemia ([place]) 10/14/2020  ? Stroke [facility]) 10/06/2020  ? Essential hypertension 10/06/2020  ? Mixed  hyperlipidemia 10/06/2020  ? ?PCP:  Pcp, No ?Pharmacy:   ?[facility] ?[address] ?[place] [address] ?Phone: [phone] Fax: [phone] ? ?[id number] [address], [address] AT [facility] OF [address] ?[place] ?[place] [address] ?Phone: [phone] Fax: [phone] ? ? ? ? ?Social Determinants of Health (SDOH) Interventions ?  ? ?Readmission Risk Interventions ?Readmission Risk Prevention Plan 12/14/2020  ?Transportation Screening Complete  ?PCP or Specialist Appt within 3-5 Days Complete  ?[place] or Home Care Consult Complete  ?Social Work Consult for [place] Planning/Counseling Complete  ?Palliative Care Screening Complete  ?Medication Review [employer]) Complete  ? ? ? ?

## 2021-05-11 NOTE — Consult Note (Signed)
Reason for Consult: Gangrene right hallux Referring Physician: Dr Fernande Boyden is an 71 y.o. male.  HPI: He was admitted last night for altered mental status.  Is a history of strokes with left-sided weakness.  Contracture of the left LE.  Reportedly ultrasound done at the nursing facility showed occlusion in the left femoral artery.  Exam in ER also showed gangrenous changes of the right hallux.  X-ray showed concern for osteomyelitis.  Past Medical History:  Diagnosis Date   BPH (benign prostatic hyperplasia)    Chronic anticoagulation 10/14/2020   Essential hypertension 10/06/2020   History of pulmonary embolism 10/14/2020   HLD (hyperlipidemia)    HTN (hypertension)    Lung nodule    6mm in 2015   Malignant neoplasm of left kidney (HCC)    Mixed hyperlipidemia 10/06/2020   Nicotine dependence, cigarettes, uncomplicated 10/14/2020   Peripheral vascular disease (HCC)    Polyp of colon    Prediabetes 10/14/2020   Pulmonary embolus (HCC)    Stroke (HCC) 10/06/2020    Past Surgical History:  Procedure Laterality Date   COLONOSCOPY  03/2017   IR GASTROSTOMY TUBE MOD SED  11/02/2020   PARTIAL NEPHRECTOMY Left 02/2018   PROSTATE BIOPSY  2015    Family History  Problem Relation Age of Onset   Clotting disorder Neg Hx     Social History:  reports that he has been smoking cigarettes. He has been smoking an average of .5 packs per day. He has never used smokeless tobacco. He reports that he does not drink alcohol and does not use drugs.  Allergies:  Allergies  Allergen Reactions   Apixaban     Other reaction(s): Liver enzymes abnormal   Lisinopril     Other reaction(s): Cough (finding)   Simvastatin     Other reaction(s): Pain in lower limb (finding)    Medications: I have reviewed the patient's current medications.  Results for orders placed or performed during the hospital encounter of 05/10/21 (from the past 48 hour(s))  Resp Panel by RT-PCR (Flu A&B, Covid)  Nasopharyngeal Swab     Status: None   Collection Time: 05/10/21  3:21 PM   Specimen: Nasopharyngeal Swab; Nasopharyngeal(NP) swabs in vial transport medium  Result Value Ref Range   SARS Coronavirus 2 by RT PCR NEGATIVE NEGATIVE    Comment: (NOTE) SARS-CoV-2 target nucleic acids are NOT DETECTED.  The SARS-CoV-2 RNA is generally detectable in upper respiratory specimens during the acute phase of infection. The lowest concentration of SARS-CoV-2 viral copies this assay can detect is 138 copies/mL. A negative result does not preclude SARS-Cov-2 infection and should not be used as the sole basis for treatment or other patient management decisions. A negative result may occur with  improper specimen collection/handling, submission of specimen other than nasopharyngeal swab, presence of viral mutation(s) within the areas targeted by this assay, and inadequate number of viral copies(<138 copies/mL). A negative result must be combined with clinical observations, patient history, and epidemiological information. The expected result is Negative.  Fact Sheet for Patients:  BloggerCourse.com  Fact Sheet for Healthcare Providers:  SeriousBroker.it  This test is no t yet approved or cleared by the [place] FDA and  has been authorized for detection and/or diagnosis of SARS-CoV-2 by FDA under an Emergency Use Authorization (EUA). This EUA will remain  in effect (meaning this test can be used) for the duration of the COVID-19 declaration under Section 564(b)(1) of the Act, 21 U.S.C.section 360bbb-3(b)(1), unless  the authorization is terminated  or revoked sooner.       Influenza A by PCR NEGATIVE NEGATIVE   Influenza B by PCR NEGATIVE NEGATIVE    Comment: (NOTE) The Xpert Xpress SARS-CoV-2/FLU/RSV plus assay is intended as an aid in the diagnosis of influenza from Nasopharyngeal swab specimens and should not be used as a sole basis for  treatment. Nasal washings and aspirates are unacceptable for Xpert Xpress SARS-CoV-2/FLU/RSV testing.  Fact Sheet for Patients: BloggerCourse.com  Fact Sheet for Healthcare Providers: SeriousBroker.it  This test is not yet approved or cleared by the [place] FDA and has been authorized for detection and/or diagnosis of SARS-CoV-2 by FDA under an Emergency Use Authorization (EUA). This EUA will remain in effect (meaning this test can be used) for the duration of the COVID-19 declaration under Section 564(b)(1) of the Act, 21 U.S.C. section 360bbb-3(b)(1), unless the authorization is terminated or revoked.  Performed at [facility], [address] [place], [place] [address]   Protime-INR     Status: Abnormal   Collection Time: 05/10/21  3:21 PM  Result Value Ref Range   Prothrombin Time 19.5 (H) 11.4 - 15.2 seconds   INR 1.7 (H) 0.8 - 1.2    Comment: (NOTE) INR goal varies based on device and disease states. Performed at [facility], [address] [place], [place] [address]   APTT     Status: Abnormal   Collection Time: 05/10/21  3:21 PM  Result Value Ref Range   aPTT 55 (H) 24 - 36 seconds    Comment:        IF BASELINE aPTT IS ELEVATED, SUGGEST PATIENT RISK ASSESSMENT BE USED TO DETERMINE APPROPRIATE ANTICOAGULANT THERAPY. Performed at [facility], [address] [place], [place] [address]   CBC     Status: Abnormal   Collection Time: 05/10/21  3:21 PM  Result Value Ref Range   WBC 7.4 4.0 - 10.5 K/uL   RBC 4.00 (L) 4.22 - 5.81 MIL/uL   Hemoglobin 10.2 (L) 13.0 - 17.0 g/dL   HCT 21.3 (L) 08.6 - 57.8 %   MCV 86.8 80.0 - 100.0 fL   MCH 25.5 (L) 26.0 - 34.0 pg   MCHC 29.4 (L) 30.0 - 36.0 g/dL   RDW 46.9 62.9 - 52.8 %   Platelets 142 (L) 150 - 400 K/uL   nRBC 0.0 0.0 - 0.2 %    Comment: Performed at [facility], [address] [place], [place] [address]   Differential     Status: None   Collection Time: 05/10/21  3:21 PM  Result Value Ref Range   Neutrophils Relative % 69 %   Neutro Abs 5.1 1.7 - 7.7 K/uL   Lymphocytes Relative 19 %   Lymphs Abs 1.4 0.7 - 4.0 K/uL   Monocytes Relative 10 %   Monocytes Absolute 0.7 0.1 - 1.0 K/uL   Eosinophils Relative 2 %   Eosinophils Absolute 0.1 0.0 - 0.5 K/uL   Basophils Relative 0 %   Basophils Absolute 0.0 0.0 - 0.1 K/uL   Immature Granulocytes 0 %   Abs Immature Granulocytes 0.03 0.00 - 0.07 K/uL    Comment: Performed at [facility], [address] [place], [place] [address]  Sedimentation rate     Status: Abnormal   Collection Time: 05/10/21  3:21 PM  Result Value Ref Range   Sed Rate 93 (H) 0 - 20 mm/hr    Comment: Performed at [facility], [address] [place], [place] [address]  Ammonia     Status: None   Collection Time: 05/10/21  3:21 PM  Result Value Ref Range   Ammonia 14 9 - 35 umol/L    Comment: HEMOLYSIS AT THIS LEVEL MAY AFFECT RESULT Performed at [facility], [address], [place], [place] [address]   Lactic acid, plasma     Status: None   Collection Time: 05/10/21  3:21 PM  Result Value Ref Range   Lactic Acid, Venous 1.8 0.5 - 1.9 mmol/L    Comment: Performed at [facility], [address] [place], [place] [address]  Blood gas, venous     Status: Abnormal (Preliminary result)   Collection Time: 05/10/21  5:12 PM  Result Value Ref Range   pH, Ven 7.36 7.25 - 7.43   pCO2, Ven 50 44 - 60 mmHg   pO2, Ven PENDING 32 - 45 mmHg   Bicarbonate 28.2 (H) 20.0 - 28.0 mmol/L   Acid-Base Excess 1.9 0.0 - 2.0 mmol/L   O2 Saturation PENDING %   Patient temperature 37.0    Collection site VEIN     Comment: Performed at [facility], [address] [place], [place] [address]  Lactic acid, plasma     Status: Abnormal   Collection Time: 05/10/21  6:39 PM  Result Value Ref Range   Lactic Acid, Venous 2.3 (HH) 0.5 - 1.9  mmol/L    Comment: CRITICAL RESULT CALLED TO, READ BACK BY AND VERIFIED WITH VANESSA  ASHLEY 05/10/21 1931 MU Performed at [facility], [address] [place], [place] [address]   Blood culture (routine x 2)     Status: None (Preliminary result)   Collection Time: 05/10/21  6:39 PM   Specimen: BLOOD  Result Value Ref Range   Specimen Description BLOOD LEFT HAND    Special Requests      BOTTLES DRAWN AEROBIC ONLY Blood Culture adequate volume   Culture      NO GROWTH < 12 HOURS Performed at [facility], [address] [place], [place] [address]    Report Status PENDING   C-reactive protein     Status: Abnormal   Collection Time: 05/10/21  6:39 PM  Result Value Ref Range   CRP 6.6 (H) <1.0 mg/dL    Comment: Performed at [facility], [address] [place], [place] [address]  Procalcitonin - Baseline     Status: None   Collection Time: 05/10/21  6:39 PM  Result Value Ref Range   Procalcitonin <0.10 ng/mL    Comment:        Interpretation: PCT (Procalcitonin) <= 0.5 ng/mL: Systemic infection (sepsis) is not likely. Local bacterial infection is possible. (NOTE)       Sepsis PCT Algorithm           Lower Respiratory Tract                                      Infection PCT Algorithm    ----------------------------     ----------------------------         PCT < 0.25 ng/mL                PCT < 0.10 ng/mL          Strongly encourage             Strongly discourage  discontinuation of antibiotics    initiation of antibiotics    ----------------------------     -----------------------------       PCT 0.25 - 0.50 ng/mL            PCT 0.10 - 0.25 ng/mL               OR       >80% decrease in PCT            Discourage initiation of                                            antibiotics      Encourage discontinuation           of antibiotics    ----------------------------     -----------------------------         PCT >= 0.50 ng/mL              PCT 0.26 -  0.50 ng/mL               AND        <80% decrease in PCT             Encourage initiation of                                             antibiotics       Encourage continuation           of antibiotics    ----------------------------     -----------------------------        PCT >= 0.50 ng/mL                  PCT > 0.50 ng/mL               AND         increase in PCT                  Strongly encourage                                      initiation of antibiotics    Strongly encourage escalation           of antibiotics                                     -----------------------------                                           PCT <= 0.25 ng/mL                                                 OR                                        >  80% decrease in PCT                                      Discontinue / Do not initiate                                             antibiotics  Performed at [facility], [address] [place], [place] [address]   Troponin I (High Sensitivity)     Status: Abnormal   Collection Time: 05/10/21  6:39 PM  Result Value Ref Range   Troponin I (High Sensitivity) 134 (HH) <18 ng/L    Comment: CRITICAL RESULT CALLED TO, READ BACK BY AND VERIFIED WITH VANESSA ASHLEY 1934 05/10/21 MU (NOTE) Elevated high sensitivity troponin I (hsTnI) values and significant  changes across serial measurements may suggest ACS but many other  chronic and acute conditions are known to elevate hsTnI results.  Refer to the "Links" section for chest pain algorithms and additional  guidance. Performed at [facility], [address] [place], [place] [address]   Comprehensive metabolic panel     Status: Abnormal   Collection Time: 05/10/21  6:39 PM  Result Value Ref Range   Sodium 146 (H) 135 - 145 mmol/L   Potassium 4.4 3.5 - 5.1 mmol/L   Chloride 115 (H) 98 - 111 mmol/L   CO2 21 (L) 22 - 32 mmol/L   Glucose, Bld 106 (H) 70 - 99 mg/dL    Comment:  Glucose reference range applies only to samples taken after fasting for at least 8 hours.   BUN 43 (H) 8 - 23 mg/dL   Creatinine, Ser 1.60 0.61 - 1.24 mg/dL   Calcium 9.1 8.9 - 10.9 mg/dL   Total Protein 7.0 6.5 - 8.1 g/dL   Albumin 2.9 (L) 3.5 - 5.0 g/dL   AST 36 15 - 41 U/L   ALT 28 0 - 44 U/L   Alkaline Phosphatase 60 38 - 126 U/L   Total Bilirubin 0.8 0.3 - 1.2 mg/dL   GFR, Estimated >32 >35 mL/min    Comment: (NOTE) Calculated using the CKD-EPI Creatinine Equation (2021)    Anion gap 10 5 - 15    Comment: Performed at [facility], [address] [place], [place] [address]  TSH     Status: None   Collection Time: 05/10/21  6:39 PM  Result Value Ref Range   TSH 3.127 0.350 - 4.500 uIU/mL    Comment: Performed by a 3rd Generation assay with a functional sensitivity of <=0.01 uIU/mL. Performed at [facility], [address] [place], [place] [address]   Magnesium     Status: None   Collection Time: 05/10/21  6:39 PM  Result Value Ref Range   Magnesium 2.0 1.7 - 2.4 mg/dL    Comment: Performed at [facility], [address] [place], [place] [address]  Lactic acid, plasma     Status: None   Collection Time: 05/11/21  1:59 AM  Result Value Ref Range   Lactic Acid, Venous 1.3 0.5 - 1.9 mmol/L    Comment: Performed at [facility], [address] [place], [place] [address]  Troponin I (High Sensitivity)     Status: Abnormal   Collection Time: 05/11/21  1:59 AM  Result Value Ref Range   Troponin I (High Sensitivity) 122 (HH) <18 ng/L    Comment: CRITICAL VALUE NOTED. VALUE IS CONSISTENT WITH PREVIOUSLY REPORTED/CALLED VALUE RH (NOTE) Elevated high sensitivity troponin I (hsTnI) values and significant  changes across serial measurements may suggest ACS but many other  chronic and acute conditions are known to elevate hsTnI results.  Refer to the "Links" section for chest pain algorithms and additional  guidance. Performed at [facility], [address] [place], [place] [address]   Culture, blood (Routine X 2) w Reflex to ID Panel     Status: None (Preliminary result)   Collection Time: 05/11/21  1:59 AM   Specimen: BLOOD  Result Value Ref Range   Specimen Description BLOOD RIGHT ASSIST CONTROL    Special Requests      BOTTLES DRAWN AEROBIC AND ANAEROBIC Blood Culture adequate volume   Culture      NO GROWTH < 12 HOURS Performed at [facility], [address], [place], [place] [address]    Report Status PENDING   Brain natriuretic peptide     Status: Abnormal   Collection Time: 05/11/21  1:59 AM  Result Value Ref Range   B Natriuretic Peptide 150.5 (H) 0.0 - 100.0 pg/mL    Comment: Performed at [facility], [address] [place], [place] [address]  Lactic acid, plasma     Status: Abnormal   Collection Time: 05/11/21  5:07 AM  Result Value Ref Range   Lactic Acid, Venous 2.0 (HH) 0.5 - 1.9 mmol/L    Comment: CRITICAL RESULT CALLED TO, READ BACK BY AND VERIFIED WITH AMBER BIESECKER @0555  05/11/21 RH Performed at [facility], [address] [place], [place] [address]   Troponin I (High Sensitivity)     Status: Abnormal   Collection Time: 05/11/21  5:07 AM  Result Value Ref Range   Troponin I (High Sensitivity) 121 (HH) <18 ng/L    Comment: CRITICAL VALUE NOTED. VALUE IS CONSISTENT WITH PREVIOUSLY REPORTED/CALLED VALUE RH (NOTE) Elevated high sensitivity troponin I (hsTnI) values and significant  changes across serial measurements may suggest ACS but many other  chronic and acute conditions are known to elevate hsTnI results.  Refer to the "Links" section for chest pain algorithms and additional  guidance. Performed at [facility], [address] [place], [place] [address]   CBC     Status: Abnormal   Collection Time: 05/11/21  5:07 AM  Result Value Ref Range   WBC 8.5 4.0 - 10.5 K/uL   RBC 3.51 (L) 4.22 - 5.81 MIL/uL   Hemoglobin 8.9 (L) 13.0 -  17.0 g/dL   HCT 40.3 (L) 47.4 - 25.9 %   MCV 86.0 80.0 - 100.0 fL   MCH 25.4 (L) 26.0 - 34.0 pg   MCHC 29.5 (L) 30.0 - 36.0 g/dL   RDW 56.3 87.5 - 64.3 %   Platelets 128 (L) 150 - 400 K/uL   nRBC 0.0 0.0 - 0.2 %    Comment: Performed at [facility], [address] [place], [place] [address]  Basic metabolic panel     Status: Abnormal   Collection Time: 05/11/21  5:07 AM  Result Value Ref Range   Sodium 150 (H) 135 - 145 mmol/L   Potassium 4.0 3.5 - 5.1 mmol/L   Chloride 122 (H) 98 - 111 mmol/L   CO2 24 22 - 32 mmol/L   Glucose, Bld 104 (H) 70 - 99 mg/dL  Comment: Glucose reference range applies only to samples taken after fasting for at least 8 hours.   BUN 39 (H) 8 - 23 mg/dL   Creatinine, Ser 1.61 0.61 - 1.24 mg/dL   Calcium 9.0 8.9 - 09.6 mg/dL   GFR, Estimated >04 >54 mL/min    Comment: (NOTE) Calculated using the CKD-EPI Creatinine Equation (2021)    Anion gap 4 (L) 5 - 15    Comment: Performed at [facility], [address] [place], [place] [address]    CT [facility] HEAD NECK W WO CM  Result Date: 05/11/2021 CLINICAL DATA:  Initial evaluation for neuro deficit, stroke suspected. EXAM: CT ANGIOGRAPHY HEAD AND NECK TECHNIQUE: Multidetector CT imaging of the head and neck was performed using the standard protocol during bolus administration of intravenous contrast. Multiplanar CT image reconstructions and MIPs were obtained to evaluate the vascular anatomy. Carotid stenosis measurements (when applicable) are obtained utilizing NASCET criteria, using the distal internal carotid diameter as the denominator. RADIATION DOSE REDUCTION: This exam was performed according to the departmental dose-optimization program which includes automated exposure control, adjustment of the mA and/or kV according to patient size and/or use of iterative reconstruction technique. CONTRAST:  75mL OMNIPAQUE IOHEXOL 350 MG/ML SOLN COMPARISON:  Comparison made with prior head CT from  earlier the same day. FINDINGS: CTA NECK FINDINGS Aortic arch: Visualized aortic arch normal caliber with normal branch pattern. Mild atheromatous change about the arch itself. No hemodynamically significant stenosis about the origin of the great vessels. Right carotid system: Right CCA tortuous but widely patent to the bifurcation. There is abrupt occlusion of the proximal cervical right ICA just distal to the bifurcation, likely chronic in nature given the large chronic right cerebral infarct seen on prior CT. Right ICA remains occluded to the skull base. Left carotid system: Left common and internal carotid arteries are somewhat tortuous but widely patent without stenosis or dissection. Vertebral arteries: Both vertebral arteries arise from the subclavian arteries. No proximal subclavian artery stenosis. Proximal left vertebral artery not well seen due to adjacent venous contamination. Visualized portions of the vertebral arteries patent without stenosis or dissection. Skeleton: Visualized osseous structures are somewhat heterogeneous without definite discrete lytic or blastic osseous lesion. Mild-to-moderate multilevel cervical spondylosis without high-grade spinal stenosis. Patient is edentulous. Other neck: No other acute soft tissue abnormality within the neck. 6 mm right thyroid nodule noted, of doubtful significance given size and patient age, no follow-up imaging recommended (ref: J Am Coll Radiol. 2015 Feb;12(2): 143-50). Upper chest: Visualized upper chest demonstrates no acute finding. Review of the MIP images confirms the above findings CTA HEAD FINDINGS Anterior circulation: Petrous left ICA widely patent. Atheromatous change within the left carotid siphon without hemodynamically significant stenosis. Right ICA remains occluded to the terminus. A1 segments patent bilaterally. Right A1 hypoplastic. Normal anterior communicating artery complex. Both anterior cerebral arteries widely patent to their  distal aspects. Left M1 widely patent. Normal left MCA bifurcation. Distal left MCA branches widely patent. Perfusion of the right M1 segment via collateral flow across the circle-of-Willis. Right M1 is somewhat diminutive with probable short-segment moderate stenosis at its distal aspect (series 9, image 53). Normal right MCA bifurcation. Right MCA branches attenuated but grossly patent distally. Posterior circulation: Both vertebral arteries patent without hemodynamically significant stenosis. Both PICA patent. Basilar patent to its distal aspect without stenosis. Superior cerebellar and posterior cerebral arteries widely patent bilaterally. Venous sinuses: Patent allowing for timing the contrast bolus. Anatomic variants: None significant.  No aneurysm.  Review of the MIP images confirms the above findings IMPRESSION: 1. 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 Right ICA remains occluded to the terminus. Right MCA and its branches are attenuated but perfused via collateral flow across the circle-of-Willis. 2. Mild atheromatous change about the left carotid bifurcation and left carotid siphon, but no hemodynamically significant or correctable stenosis. 3. Diffuse tortuosity of the major arterial vasculature of the head and neck, suggesting chronic underlying hypertension. Electronically Signed   By: Rise Mu M.D.   On: 05/11/2021 00:11   CT HEAD WO CONTRAST ( )  Result Date: 05/10/2021 CLINICAL DATA:  Mental status change.  History of stroke EXAM: CT HEAD WITHOUT CONTRAST TECHNIQUE: Contiguous axial images were obtained from the base of the skull through the vertex without intravenous contrast. RADIATION DOSE REDUCTION: This exam was performed according to the departmental dose-optimization program which includes automated exposure control, adjustment of the mA and/or kV according to patient size and/or use of iterative reconstruction technique. COMPARISON:  CT head  11/16/2020 FINDINGS: Brain: Encephalomalacia in the for right frontal parietal and temporal lobe compatible with chronic infarct. Dystrophic calcification in the infarct has developed since the prior study. There is also chronic infarct involving the right occipital lobe. Territory similar to the prior study. New area of hypodensity right thalamus and right splenium. Negative for acute   hemorrhage, mass.  Negative for hydrocephalus. Vascular: Negative for hyperdense vessel Skull: No focal abnormality Sinuses/Orbits: Paranasal sinuses clear.  Negative orbit Other: None IMPRESSION: Chronic right MCA infarct. Hypodensity in the right thalamus and splenium of the corpus callosum. This was not identified on the prior MRI 10/28/2020 and could be a new area of infarct. Negative for hemorrhage. Electronically Signed   By: Marlan Palau M.D.   On: 05/10/2021 15:39   DG Foot Complete Right  Result Date: 05/10/2021 CLINICAL DATA:  Concern for osteomyelitis of the right great toe EXAM: RIGHT FOOT COMPLETE - 3+ VIEW COMPARISON:  None. FINDINGS: Erosive changes along the lateral margin of the tuft of the great toe distal phalanx compatible with acute osteomyelitis. No additional sites of focal bone erosion are seen. No fracture or dislocation. Degenerative changes are most pronounced at the first and second MTP joints. There is soft tissue swelling about the foot. IMPRESSION: Acute osteomyelitis of the right great toe distal phalanx. Electronically Signed   By: Duanne Guess D.O.   On: 05/10/2021 15:29    Review of Systems  Reason unable to perform ROS: Patient is nonverbal due to stroke.  Blood pressure 132/82, pulse 84, temperature 98.1 F (36.7 C), temperature source Oral, resp. rate 16, height 5\' 7"  (1.702 m), weight 88.5 kg, SpO2 100 %.  Vitals:   05/11/21 0719 05/11/21 1138  BP: (!) 147/71 132/82  Pulse: 70 84  Resp: 16 16  Temp: 97.9 F (36.6 C) 98.1 F (36.7 C)  SpO2: 100% 100%    General  Nonverbal he is alert to voice but cannot communicate  Vascular DP and PT pulse are nonpalpable there is a weak monophasic signal present  Neurologic Epicritic sensation grossly absent.  Dermatologic (Wound) Gangrenous changes to distal hallux no cellulitis purulence malodor or exposed bone tendon or joint  Orthopedic: Unable to assess motor function       Assessment/Plan:  Right hallux gangrene -Imaging: Studies independently reviewed -Antibiotics: Suspect osteomyelitis of right hallux.  Recommend ID consult for antibiotic recommendations -WB Status: WBAT -Apply Betadine to right hallux daily -MRI was not able  to be completed of the right lower extremity due to left leg contracture.  We will hold off on further advanced imaging until we understand more about his vascular status in the right leg -He is at high risk of limb loss on the right lower extremity I think at least a partial or total hallux amputation will be necessary.  This will depend on revascularization by vascular surgery who has been consulted.  If there are no reasonable options to improve vascular flow in the right lower extremity may require major amputation.  Any surgical invention will be high risk with his stroke history. -I updated his daughter [name] on the above. -We will continue to follow closely  Edwin Cap 05/11/2021, 1:28 PM   Best available via secure chat for questions or concerns.

## 2021-05-11 NOTE — Consult Note (Signed)
ANTICOAGULATION CONSULT NOTE - Initial Consult ? ?Pharmacy Consult for heparin ?Indication: Hx of PE ? ?Allergies  ?Allergen Reactions  ? Apixaban   ?  Other reaction(s): Liver enzymes abnormal  ? Lisinopril   ?  Other reaction(s): Cough (finding)  ? Simvastatin   ?  Other reaction(s): Pain in lower limb (finding)  ? ? ?Patient Measurements: ?Height: '5\' 7"'$  (170.2 cm) ?Weight: 88.5 kg (195 lb) ?IBW/kg (Calculated) : 66.1 ?Heparin Dosing Weight: 84.4kg ? ?Vital Signs: ?Temp: 98.1 ?F (36.7 ?C) (03/08 1138) ?Temp Source: Oral (03/08 1138) ?BP: 132/82 (03/08 1138) ?Pulse Rate: 84 (03/08 1138) ? ?Labs: ?Recent Labs  ?  05/10/21 ?1521 05/10/21 ?1839 05/11/21 ?0159 05/11/21 ?0507  ?HGB 10.2*  --   --  8.9*  ?HCT 34.7*  --   --  30.2*  ?PLT 142*  --   --  128*  ?APTT 55*  --   --   --   ?LABPROT 19.5*  --   --   --   ?INR 1.7*  --   --   --   ?CREATININE  --  1.12  --  1.18  ?TROPONINIHS  --  134* 122* 121*  ? ? ?Estimated Creatinine Clearance: 60.1 mL/min (by C-G formula based on SCr of 1.18 mg/dL). ? ? ?Medical History: ?Past Medical History:  ?Diagnosis Date  ? BPH (benign prostatic hyperplasia)   ? Chronic anticoagulation 10/14/2020  ? Essential hypertension 10/06/2020  ? History of pulmonary embolism 10/14/2020  ? HLD (hyperlipidemia)   ? HTN (hypertension)   ? Lung nodule   ? 71m in 2015  ? Malignant neoplasm of left kidney (HCC)   ? Mixed hyperlipidemia 10/06/2020  ? Nicotine dependence, cigarettes, uncomplicated 81/76/1607 ? [place]ipheral vascular disease ([place]   ? Polyp of colon   ? Prediabetes 10/14/2020  ? Pulmonary embolus ([facility]   ? [facility] 10/06/2020  ? ? ?Medications:  ?PTA: Eliquis '5mg'$  BID ?Inpatient: Eliquis > 3/8 > Heparin Drip (3/8 >>>) ?Allergies: reported abnormal liver enzymes on eliquis ? ? ?Date Time aPTT/HL Rate/Comment ?     ? ?Baseline Labs: ?aPTT - 55 ?Hgb - 8.9 ?Plts - 128 ?Heparin level - pending ? ? ?Assessment: ?71year old male, SNF resident, medical history significant for HTN, pulmonary embolism on  Eliquis, HLD, PAD, prediabetes, CVA with residual left hemiplegia, PEG tube dependent, nonverbal, sent from SNF due to abnormal USG that showed abnormal circulation.  In ED, dopplerable pulses in both feet.  Podiatry/Dr. EAmalia Haileyconsulted for suspected osteomyelitis of right first toe.  Vascular surgery consulted as well. Pharmacy consulted for management of heparin drip in the setting of history for PE. ? ?Goal of Therapy:  ?Heparin level 0.3-0.7 units/ml ?aPTT 66-102 seconds ?Monitor platelets by anticoagulation protocol: Yes ?  ?Plan:  ?Give 4200 units bolus x1; then start heparin infusion at 1300 units/hr ?Check aPTT/Anti-Xa level in 8 hours and daily once consecutively therapeutic.  ?Titrate by aPTT's until lab correlation is noted, then titrate by anti-xa alone. ?Continue to monitor H&H and platelets daily while on heparin gtt. ? ?ADarrick Penna PharmD, MS PGPM ?Clinical Pharmacist ?05/11/2021 ?4:08 PM ? ? ? ?

## 2021-05-11 NOTE — [place] FL2 (Signed)
?[place] MEDICAID FL2 LEVEL OF CARE SCREENING TOOL  ?  ? ?IDENTIFICATION  ?Patient Name: ?Clifford [name] Birthdate: September 01, 1950 Sex: male Admission Date (Current Location): ?05/10/2021  ?[place] and [place] Number: ? [place] ?  Facility and Address:  ?[facility], [address] [place], [place] [address] ?     Provider Number: ?[id number]  ?Attending Physician Name and Address:  ?Modena Jansky, MD ? Relative Name and Phone Number:  ?  ?   ?Current Level of Care: ?Hospital Recommended Level of Care: ?[place] Prior Approval Number: ?  ? ?Date Approved/Denied: ?  PASRR Number: ?[id number] A ? ?Discharge Plan: ?SNF ?  ? ?Current Diagnoses: ?Patient Active Problem List  ? Diagnosis Date Noted  ? AMS (altered mental status) 05/10/2021  ? Absent pedal pulses 05/10/2021  ? Acute osteomyelitis of toe, right ([place]) 05/10/2021  ? Hypernatremia 05/10/2021  ? Lactic acidosis 05/10/2021  ? Thrombocytopenia ([place]) 05/10/2021  ? Elevated troponin level 05/10/2021  ? Left spastic hemiplegia ([place]) 12/14/2020  ? Left-sided neglect 12/14/2020  ? Dysphagia 12/14/2020  ? Nodule of upper lobe of left lung-8 mm 12/14/2020  ? Seizure-like activity ([place]) 12/14/2020  ? Stage 3a chronic kidney disease (CKD) ([place]) 12/14/2020  ? Chronic diastolic congestive heart failure ([place]) 12/14/2020  ? History of recurrent deep vein thrombosis (DVT) 12/14/2020  ? Advanced care planning/counseling discussion   ? Goals of care, counseling/discussion   ? Palliative care by specialist   ? Left-sided weakness 10/14/2020  ? Prediabetes 10/14/2020  ? History of pulmonary embolism 10/14/2020  ? Chronic anticoagulation 10/14/2020  ? Nicotine dependence, cigarettes, uncomplicated 64/33/2951  ? Petechial hemorrhage 10/14/2020  ? Acute stroke due to ischemia ([place]) 10/14/2020  ? Stroke [facility]) 10/06/2020  ? Essential hypertension 10/06/2020  ? Mixed hyperlipidemia 10/06/2020  ? ? ?Orientation RESPIRATION BLADDER Height & Weight    ?  ? (undetermined, non verbal) ? Normal Continent Weight: 195 lb (88.5 kg) ?Height:  '5\' 7"'$  (170.2 cm)  ?BEHAVIORAL SYMPTOMS/MOOD NEUROLOGICAL BOWEL NUTRITION STATUS  ?    Incontinent Diet (NPO at this time, diet subject to change, please see dc summary)  ?AMBULATORY STATUS COMMUNICATION OF NEEDS Skin   ?Extensive Assist Verbally Normal ?  ?  ?  ?    ?     ?     ? ? ?Personal Care Assistance Level of Assistance  ?Bathing, Feeding, Dressing Bathing Assistance: Maximum assistance ?Feeding assistance: Independent ?Dressing Assistance: Maximum assistance ?   ? ?Functional Limitations Info  ?Sight, Hearing, Speech Sight Info: Adequate ?Hearing Info: Adequate ?Speech Info: Adequate  ? ? ?SPECIAL CARE FACTORS FREQUENCY  ?PT (By licensed PT), OT (By licensed OT)   ?  ?PT Frequency: 5x ?OT Frequency: 5x ?  ?  ?  ?   ? ? ?Contractures Contractures Info: Not present  ? ? ?Additional Factors Info  ?Code Status, Allergies Code Status Info: full code ?Allergies Info: Apixaban, Lisinopril, Simvastatin ?  ?  ?  ?   ? ?Current Medications (05/11/2021):  This is the current hospital active medication list ?Current Facility-Administered Medications  ?Medication Dose Route Frequency Provider Last Rate Last Admin  ? acetaminophen (TYLENOL) tablet 650 mg  650 mg Oral Q6H PRN Para Skeans, MD      ? Or  ? acetaminophen (TYLENOL) suppository 650 mg  650 mg Rectal Q6H PRN Para Skeans, MD      ? atorvastatin (LIPITOR) tablet 40 mg  40 mg Per Tube QHS Florina Ou  V, MD   40 mg at 05/10/21 2325  ? ceFEPIme (MAXIPIME) 2 g in sodium chloride 0.9 % 100 mL IVPB  2 g Intravenous Q8H Renda Rolls, RPH 200 mL/hr at 05/11/21 0455 2 g at 05/11/21 0455  ? famotidine (PEPCID) 40 MG/5ML suspension 20 mg  20 mg Per Tube BID Florina Ou V, MD   20 mg at 05/11/21 0946  ? free water 100 mL  100 mL Per Tube Q4H Florina Ou V, MD   100 mL at 05/11/21 0830  ? hydrALAZINE (APRESOLINE) injection 10 mg  10 mg Intravenous Q6H PRN Para Skeans, MD      ?  levETIRAcetam (KEPPRA) 100 MG/ML solution 250 mg  250 mg Per Tube BID Para Skeans, MD   250 mg at 05/11/21 0944  ? pantoprazole sodium (PROTONIX) 40 mg/20 mL oral suspension 40 mg  40 mg Per Tube BID Para Skeans, MD   40 mg at 05/11/21 0946  ? QUEtiapine (SEROQUEL) tablet 25 mg  25 mg Per Tube QHS Para Skeans, MD   25 mg at 05/10/21 2325  ? topiramate (TOPAMAX) tablet 50 mg  50 mg Per Tube BID Para Skeans, MD   50 mg at 05/11/21 0945  ? vancomycin (VANCOREADY) IVPB 1500 mg/300 mL  1,500 mg Intravenous Q24H Renda Rolls, RPH      ? ? ? ?Discharge Medications: ?Please see discharge summary for a list of discharge medications. ? ?Relevant Imaging Results: ? ?Relevant Lab Results: ? ? ?Additional Information ?SCB:[id number] ? ?Cohan Stipes A Cartel Mauss, LCSW ? ? ? ? ?

## 2021-05-11 NOTE — Consult Note (Signed)
Consultation Note Date: 05/11/2021   Patient Name: Clifford [name]  DOB: 1950/07/24  MRN: [id number]  Age / Sex: 71 y.o., male  PCP: Pcp, No Referring Physician: Modena Jansky, MD  Reason for Consultation: Establishing goals of care  HPI/Patient Profile: 71 y.o. male  with past medical history of first stroke was Aug 2022, CVA February 2022, HTN/HLD, tobacco abuse, PVD, PE on Eliquis, BPH, history of CVA with PEG tube and left upper and lower extremity contractures, osteomyelitis of right great toe, admitted on 05/10/2021 with altered mental status, acute osteomyelitis of right great toe.   Clinical Assessment and Goals of Care: I have reviewed medical records including EPIC notes, labs and imaging, received report from RN, assessed the patient.  Clifford [name] is lying quietly in bed.  He looks acutely/chronically ill and quite frail.  He is alert, but does not attempt to make eye contact.  He is nonverbal at this time, and clearly cannot make his needs known.  There is no family at bedside at this time.    Call to daughter, Adonis Huguenin, to discuss diagnosis prognosis, Clifford [name], EOL wishes, disposition and options.   I introduced Palliative Medicine as specialized medical care for people living with serious illness. It focuses on providing relief from the symptoms and stress of a serious illness. The goal is to improve quality of life for both the patient and the family.  We discussed a brief life review of the patient.  Clifford [name] shares that Clifford [name] was in the [employer].  He went to [place] where he met his wife and had his family including children and grandchildren.  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  He came to [place] in order to visit his brother.  He has 3 children.  His parents are deceased, he has 6 siblings.    We then focused on their current illness.  Clifford [name] shares that Clifford [name] was visiting his  brother here in [place] when he had his first stroke in August 2022.  She shares that he is declined markedly since then.  We talk about his vascular problems, his osteomyelitis of toe, possible need for amputations.  I shared with Clifford [name] that she and her siblings have a right to say what this time looks like and feels like for [name].  If they elect to not proceed with surgery if needed/offered, and want to focus on comfort and dignity, to let nature take its course, he would clearly qualify for residential hospice care.  The natural disease trajectory and expectations at EOL were discussed.  Advanced directives, concepts specific to code status, artifical feeding and hydration, and rehospitalization were considered and discussed.  Clifford [name] shares that [name] would not want to live like this.  She readily agrees to DNR status.  Discussed the importance of continued conversation with family and the medical providers regarding overall plan of care and treatment options, ensuring decisions are within the context of the patients values and GOCs.  Questions  and concerns were addressed. The family was encouraged to call with questions or concerns.  PMT will continue to support holistically.  Conference with attending, bedside nursing staff, transition of care team related to patient condition, needs, goals of care, disposition. PMT to continue to follow.   HCPOA NEXT OF KIN unmarried, 3 children Clifford [name] is oldest. Parents deceased. 6 siblings.     SUMMARY OF RECOMMENDATIONS   Treat the treatable but no CPR or intubation Time for outcomes Awaiting vascular surgery recommendations We talked about comfort care   Code Status/Advance Care Planning: DNR  Symptom Management:  Per hospitalist, no additional needs at this time.  Palliative Prophylaxis:  Frequent Pain Assessment, Oral Care, Palliative Wound Care, and Turn Reposition  Additional Recommendations (Limitations, Scope,  Preferences): Full Scope Treatment  Psycho-social/Spiritual:  Desire for further Chaplaincy support:no Additional Recommendations: Caregiving  Support/Resources and Education on Hospice  Prognosis:  Unable to determine, based on outcomes.  Guarded at this point.  3 months or less would not be surprising even with aggressive care based on functional decline, chronic illness burden.  Discharge Planning:  To be determined, based on outcomes.  [facility] care if possible.       Primary Diagnoses: Present on Admission:  History of pulmonary embolism  Essential hypertension  Stage 3a chronic kidney disease (CKD) (HCC)  Chronic diastolic congestive heart failure (HCC)  AMS (altered mental status)  Absent pedal pulses  Acute osteomyelitis of toe, right (HCC)  Hypernatremia  Lactic acidosis  Thrombocytopenia (HCC)  Elevated troponin level   I have reviewed the medical record, interviewed the patient and family, and examined the patient. The following aspects are pertinent.  Past Medical History:  Diagnosis Date   BPH (benign prostatic hyperplasia)    Chronic anticoagulation 10/14/2020   Essential hypertension 10/06/2020   History of pulmonary embolism 10/14/2020   HLD (hyperlipidemia)    HTN (hypertension)    Lung nodule    15m in 2015   Malignant neoplasm of left kidney (HCC)    Mixed hyperlipidemia 10/06/2020   Nicotine dependence, cigarettes, uncomplicated 86/76/1950  Peripheral vascular disease ([place]    Polyp of colon    Prediabetes 10/14/2020   [place]ulmonary embolus ([facility]    [place] 10/06/2020   Social History   Socioeconomic History   Marital status: Single    Spouse name: Not on file   Number of children: Not on file   Years of education: Not on file   Highest education level: Not on file  Occupational History   Not on file  Tobacco Use   Smoking status: Every Day    Packs/day: 0.50    Types: Cigarettes   Smokeless tobacco: Never   Vaping Use   Vaping Use: Never used  Substance and Sexual Activity   Alcohol use: Never   Drug use: Never   Sexual activity: Not on file  Other Topics Concern   Not on file  Social History Narrative   Not on file   Social Determinants of Health   Financial Resource Strain: Not on file  Food Insecurity: Not on file  Transportation Needs: Not on file  Physical Activity: Not on file  Stress: Not on file  Social Connections: Not on file   Family History  Problem Relation Age of Onset   Clotting disorder Neg Hx    Scheduled Meds:  atorvastatin  40 mg Per Tube QHS   famotidine  20 mg Per Tube BID  free water  100 mL Per Tube Q4H   levETIRAcetam  250 mg Per Tube BID   pantoprazole sodium  40 mg Per Tube BID   QUEtiapine  25 mg Per Tube QHS   topiramate  50 mg Per Tube BID   Continuous Infusions:  ceFEPime (MAXIPIME) IV 2 g (05/11/21 0455)   vancomycin     PRN Meds:.acetaminophen **OR** acetaminophen, hydrALAZINE Medications Prior to Admission:  Prior to Admission medications   Medication Sig Start Date End Date Taking? Authorizing Provider  acetaminophen (TYLENOL) 500 MG tablet Take 1,000 mg by mouth daily.   Yes [provider]  amLODipine (NORVASC) 10 MG tablet Take 10 mg by mouth daily. 03/08/21  Yes [provider]  amLODipine (NORVASC) 2.5 MG tablet Take 2.5 mg by mouth daily. 05/05/21  Yes [provider]  apixaban (ELIQUIS) 5 MG TABS tablet Place 1 tablet (5 mg total) into feeding tube 2 (two) times daily. 12/13/20  Yes Samella Parr, NP  atorvastatin (LIPITOR) 40 MG tablet Place 1 tablet (40 mg total) into feeding tube daily. 12/14/20  Yes Samella Parr, NP  carvedilol (COREG) 12.5 MG tablet Place 1 tablet (12.5 mg total) into feeding tube 2 (two) times daily with a meal. 12/13/20  Yes Samella Parr, NP  famotidine (PEPCID) 40 MG/5ML suspension Place 2.5 mLs (20 mg total) into feeding tube 2 (two) times daily. 12/13/20  Yes Samella Parr, NP  insulin aspart (NOVOLOG) 100 UNIT/ML injection Inject 0-9 Units into the skin every 4 (four) hours. Do NOT hold insulin if patient is NPO. Sensitive scale. 12/13/20  Yes Samella Parr, NP  levETIRAcetam (KEPPRA) 100 MG/ML solution Place 2.5 mLs (250 mg total) into feeding tube 2 (two) times daily. 12/13/20  Yes Samella Parr, NP  modafinil (PROVIGIL) 100 MG tablet Place 1 tablet (100 mg total) into feeding tube daily. 12/14/20  Yes Samella Parr, NP  pantoprazole sodium (PROTONIX) 40 mg Place 40 mg into feeding tube 2 (two) times daily. 12/13/20  Yes Samella Parr, NP  QUEtiapine (SEROQUEL) 25 MG tablet Place 1 tablet (25 mg total) into feeding tube at bedtime. 12/13/20  Yes Samella Parr, NP  saccharomyces boulardii (FLORASTOR) 250 MG capsule Place 1 capsule (250 mg total) into feeding tube 2 (two) times daily. 12/13/20  Yes Samella Parr, NP  topiramate (TOPAMAX) 50 MG tablet Place 1 tablet (50 mg total) into feeding tube 2 (two) times daily. 12/13/20  Yes Samella Parr, NP  traMADol (ULTRAM) 50 MG tablet Take 50 mg by mouth at bedtime.   Yes [provider]  acetaminophen (TYLENOL) 160 MG/5ML solution Place 20.3 mLs (650 mg total) into feeding tube every 6 (six) hours. Patient not taking: Reported on 05/10/2021 12/13/20   Samella Parr, NP  amLODipine (NORVASC) 5 MG tablet Take 5 mg by mouth daily. 1.5 tab daily Patient not taking: Reported on 05/10/2021    [provider]  cyclobenzaprine (FLEXERIL) 5 MG tablet Place 1 tablet (5 mg total) into feeding tube at bedtime. Patient not taking: Reported on 05/10/2021 12/13/20   Samella Parr, NP  diclofenac Sodium (VOLTAREN) 1 % GEL Apply 2 g topically 4 (four) times daily as needed (4x Daily as needed for pain). 12/14/20   Samella Parr, NP  metaxalone (SKELAXIN) 800 MG tablet Place 1 tablet (800 mg total) into feeding tube 3 (three) times daily. Patient not taking: Reported on 05/10/2021  12/13/20   Lissa Merlin,  Leisa Lenz, NP  polyethylene glycol (MIRALAX / GLYCOLAX) 17 g packet Place 17 g into feeding tube daily as needed for mild constipation. 12/13/20   Samella Parr, NP   Allergies  Allergen Reactions   Apixaban     Other reaction(s): Liver enzymes abnormal   Lisinopril     Other reaction(s): Cough (finding)   Simvastatin     Other reaction(s): Pain in lower limb (finding)   Review of Systems  Unable to perform ROS: Acuity of condition   Physical Exam Vitals and nursing note reviewed.  Constitutional:      General: He is not in acute distress.    Appearance: He is ill-appearing.  Pulmonary:     Effort: Pulmonary effort is normal. No respiratory distress.  Musculoskeletal:     Comments: Left leg and left arm contracted , right great toe wound, wounds as noted by nursing  Skin:    General: Skin is warm and dry.  Neurological:     Comments: Nonverbal at this time    Vital Signs: BP 132/82 (BP Location: Left Arm)    Pulse 84    Temp 98.1 F (36.7 C) (Oral)    Resp 16    Ht '5\' 7"'  (1.702 m)    Wt 88.5 kg    SpO2 100%    BMI 30.54 kg/m  Pain Scale: PAINAD       SpO2: SpO2: 100 % O2 Device:SpO2: 100 % O2 Flow Rate: .   IO: Intake/output summary:  Intake/Output Summary (Last 24 hours) at 05/11/2021 1230 Last data filed at 05/11/2021 0300 Gross per 24 hour  Intake 99.74 ml  Output 200 ml  Net -100.26 ml    LBM: Last BM Date :  (PTA) Baseline Weight: Weight: 88.5 kg Most recent weight: Weight: 88.5 kg     Palliative Assessment/Data:   Flowsheet Rows    Flowsheet Row Most Recent Value  Intake Tab   Referral Department Hospitalist  Unit at Time of Referral Cardiac/Telemetry Unit  Palliative Care Primary Diagnosis Other (Comment)  Date Notified 05/11/21  Palliative Care Type New Palliative care  Reason for referral Clarify Goals of Care  Date of Admission 05/10/21  Date first seen by Palliative Care 05/11/21  # of days Palliative referral response  time 0 Day(s)  # of days IP prior to Palliative referral 1  Clinical Assessment   Palliative Performance Scale Score 20%  Pain Max last 24 hours Not able to report  Pain Min Last 24 hours Not able to report  Dyspnea Max Last 24 Hours Not able to report  Dyspnea Min Last 24 hours Not able to report  Psychosocial & Spiritual Assessment   Palliative Care Outcomes        Time In: 0945 Time Out: 1100 Time Total: 75 minutes  Greater than 50%  of this time was spent counseling and coordinating care related to the above assessment and plan.  Signed by: Drue Novel, NP   Please contact Palliative Medicine Team phone at [phone] for questions and concerns.  For individual provider: See Shea Evans

## 2021-05-11 NOTE — Consult Note (Signed)
[place] Nurse wound consult note ?Consultation was completed by review of records, images and assistance from the bedside nurse/clinical staff.  ? ?Reason for Consult: ?Stage 2 pressure injuries; sacrum ?LE wounds DTPI?  Noted to have ulceration and discoloration of the great toe; acute osteomyelitis right great toe. Planning MRI and podiatry consult.   ?Wound type: ?Pressure injury: Stage 2 ?Pressure Injury POA: Yes ?Measurement:see nursing flowsheets  ?Wound bed:see nursing flow sheets ?Periwound: intact ?Dressing procedure/placement/frequency:  ?Continue silicone foam to the sacrum per the nursing skin care order set ?Pending MRI/podiatry consult; await wound care/intervention per podiatry.  ? ?Discussed POC with patient and bedside nurse.  ?Re consult if needed, will not follow at this time. ?Thanks ? Kalesha Irving [facility] MSN, RN,CWOCN, CNS, CWON-AP [phone])  ?

## 2021-05-11 NOTE — Assessment & Plan Note (Addendum)
No overt bleeding. ?Transfuse if hemoglobin 7 g or less. ?Hemoglobin up to 9.6. ?No further lab draws. ?

## 2021-05-11 NOTE — Progress Notes (Signed)
SLP Cancellation Note ? ?Patient Details ?Name: Clifford [name] ?MRN: [id number] ?DOB: [birth year] ? ? ?Cancelled treatment:       Reason Eval/Treat Not Completed: SLP screened, no needs identified, will sign off (chart reviewed; consulted MD, Dietician, [place] re: pt's status at this time during Acute admit) ?Per chart notes, pt had significant Dysphagia aug-oct 2022 post CVA w/ edema and mass effect when at [facility] d/c to the NH (w/ a last diet order of dys 2 w/ thins per chart), he is now being fed Pureed foods at the NH for pleasure(?) during the day w/ Nocturnal TFs via PEG for most support per a NSG report from the NH to [facility] [place].    ? ?Currently, pt's mental status seems to fluctuate; he is nonverbal and does not clearly make wants/needs known. Dietician consulted stated concern for any ability to meet nutrition needs orally and recommended 24/7 full PEG TFs support to prevent hypoglycemic episodes.  ? ?During this time of Acute illness, hospitalization, and declined mental status/Cognition, attempts of oral intake even for pleasure are not recommended d/t the increased RISK for aspiration and Pulmonary decline. Recommend use of PEG for all nutrition/hydration needs as a conservative measure for caution in hopes to stabilize him in order to return to his NH. When he is back in his known environment/routine and the Acuity of illness has lessened, he can return to his "routine" of purees during the day at the [facility] w/ Nocturnal TFs via PEG, if appropriate per MD/Dietician orders. Recommend frequent oral care for hygiene and stimulation of swallowing currently. MD/NSG updated. MD/NSG agreed.   ? ? ? ? ? ?Orinda Kenner, MS, CCC-SLP ?Speech Language Pathologist ?Rehab Services; [place] ?[phone] (ascom) ?Cadynce Garrette ?05/11/2021, 5:25 PM ?

## 2021-05-11 NOTE — H&P (View-Only) (Signed)
[facility] Consult Note  MRN : [id number]  Clifford [name] is a 71 y.o. ([birth year]) male who presents with chief complaint of  Chief Complaint  Patient presents with   femoral occlusion  .  Reason for Consult: Gangrene right great toe with history of PAD Referring Physician: Dr Algis Liming  History of Present Illness: Clifford [name] is a 71 year old male that presented to [facility] on 05/10/2021 due to altered mental status.  The patient has left-sided weakness following history of strokes.  The left lower extremity does have a contracture.  There is also a wound on the right great toe with gangrenous changes.  Recent x-ray showed concern for osteomyelitis.  The patient is currently awake however he is not responding to questions or commands.  Much of the history is provided by the brother that is at bedside.  The patient's brother notes that he has a history of peripheral arterial disease and only was previously in [place], his doctor would not allow an angiogram to be done because he continues to smoke.  The brother notes that he has not smoked in approximately 6 months.  He notes that the wound has improved only slightly , after some recent wound care was done at his nursing facility.  Current Facility-Administered Medications  Medication Dose Route Frequency Provider Last Rate Last Admin   acetaminophen (TYLENOL) tablet 650 mg  650 mg Oral Q6H PRN Para Skeans, MD       Or   acetaminophen (TYLENOL) suppository 650 mg  650 mg Rectal Q6H PRN Para Skeans, MD       atorvastatin (LIPITOR) tablet 40 mg  40 mg Per Tube QHS Florina Ou V, MD   40 mg at 05/10/21 2325   ceFEPIme (MAXIPIME) 2 g in sodium chloride 0.9 % 100 mL IVPB  2 g Intravenous Q8H BelueAlver Sorrow, RPH 200 mL/hr at 05/11/21 1426 2 g at 05/11/21 1426   famotidine (PEPCID) 40 MG/5ML suspension 20 mg  20 mg Per Tube BID Florina Ou V, MD   20 mg at 05/11/21 0946   free water 100 mL  100  mL Per Tube Q4H Florina Ou V, MD   100 mL at 05/11/21 1330   hydrALAZINE (APRESOLINE) injection 10 mg  10 mg Intravenous Q6H PRN Para Skeans, MD       levETIRAcetam (KEPPRA) 100 MG/ML solution 250 mg  250 mg Per Tube BID Florina Ou V, MD   250 mg at 05/11/21 0944   pantoprazole sodium (PROTONIX) 40 mg/20 mL oral suspension 40 mg  40 mg Per Tube BID Florina Ou V, MD   40 mg at 05/11/21 0946   QUEtiapine (SEROQUEL) tablet 25 mg  25 mg Per Tube QHS Para Skeans, MD   25 mg at 05/10/21 2325   topiramate (TOPAMAX) tablet 50 mg  50 mg Per Tube BID Para Skeans, MD   50 mg at 05/11/21 0945   vancomycin (VANCOREADY) IVPB 1500 mg/300 mL  1,500 mg Intravenous Q24H Renda Rolls, [facility]        Past Medical History:  Diagnosis Date   BPH (benign prostatic hyperplasia)    Chronic anticoagulation 10/14/2020   Essential hypertension 10/06/2020   History of pulmonary embolism 10/14/2020   HLD (hyperlipidemia)    HTN (hypertension)    Lung nodule    45m in 2015   [place]    Mixed hyperlipidemia 10/06/2020  Nicotine dependence, cigarettes, uncomplicated 3/87/5643   Peripheral vascular disease ([place])    Polyp of colon    Prediabetes 10/14/2020   Pulmonary embolus ([place])    Stroke ([place]) 10/06/2020    Past Surgical History:  Procedure Laterality Date   COLONOSCOPY  03/2017   IR GASTROSTOMY TUBE MOD SED  11/02/2020   PARTIAL NEPHRECTOMY Left 02/2018   PROSTATE BIOPSY  2015    Social History Social History   Tobacco Use   Smoking status: Every Day    Packs/day: 0.50    Types: Cigarettes   Smokeless tobacco: Never  Vaping Use   Vaping Use: Never used  Substance Use Topics   Alcohol use: Never   Drug use: Never    Family History Family History  Problem Relation Age of Onset   Clotting disorder Neg Hx     Allergies  Allergen Reactions   Apixaban     Other reaction(s): Liver enzymes abnormal   Lisinopril     Other reaction(s): Cough (finding)    Simvastatin     Other reaction(s): Pain in lower limb (finding)     REVIEW OF SYSTEMS (Negative unless checked)  Review of systems provided by brother  Constitutional: '[]'$ Weight loss  '[]'$ Fever  '[]'$ Chills Cardiac: '[]'$ Chest pain   '[]'$ Chest pressure   '[]'$ Palpitations   '[]'$ Shortness of breath when laying flat   '[]'$ Shortness of breath at rest   '[]'$ Shortness of breath with exertion. Vascular:  '[]'$ Pain in legs with walking   '[]'$ Pain in legs at rest   '[]'$ Pain in legs when laying flat   '[]'$ Claudication   '[]'$ Pain in feet when walking  '[]'$ Pain in feet at rest  '[]'$ Pain in feet when laying flat   '[]'$ History of DVT   '[]'$ Phlebitis   '[]'$ Swelling in legs   '[]'$ Varicose veins   '[x]'$ Non-healing ulcers Pulmonary:   '[]'$ Uses home oxygen   '[]'$ Productive cough   '[]'$ Hemoptysis   '[]'$ Wheeze  '[]'$ COPD   '[]'$ Asthma Neurologic:  '[]'$ Dizziness  '[]'$ Blackouts   '[]'$ Seizures   '[x]'$ History of stroke   '[]'$ History of TIA  '[]'$ Aphasia   '[]'$ Temporary blindness   '[]'$ Dysphagia   '[]'$ Weakness or numbness in arms   '[x]'$ Weakness or numbness in legs Musculoskeletal:  '[]'$ Arthritis   '[]'$ Joint swelling   '[]'$ Joint pain   '[]'$ Low back pain Hematologic:  '[]'$ Easy bruising  '[]'$ Easy bleeding   '[]'$ Hypercoagulable state   '[]'$ Anemic  '[]'$ Hepatitis Gastrointestinal:  '[]'$ Blood in stool   '[]'$ Vomiting blood  '[]'$ Gastroesophageal reflux/heartburn   '[]'$ Difficulty swallowing. Genitourinary:  '[]'$ Chronic kidney disease   '[]'$ Difficult urination  '[]'$ Frequent urination  '[]'$ Burning with urination   '[]'$ Blood in urine Skin:  '[]'$ Rashes   '[]'$ Ulcers   '[]'$ Wounds Psychological:  '[]'$ History of anxiety   '[]'$  History of major depression.  Physical Examination  Vitals:   05/11/21 0158 05/11/21 0411 05/11/21 0719 05/11/21 1138  BP: 109/66 138/74 (!) 147/71 132/82  Pulse: 67 71 70 84  Resp: '16 14 16 16  '$ Temp: 98.8 F (37.1 C) 98.1 F (36.7 C) 97.9 F (36.6 C) 98.1 F (36.7 C)  TempSrc: Oral Oral Oral Oral  SpO2: 100% 100% 100% 100%  Weight:      Height:       Body mass index is 30.54 kg/m. Gen:  WD/WN, NAD, cachectic  appearance Head: [place]/AT, No temporalis wasting. Prominent temp pulse not noted. Ear/Nose/Throat: Hearing grossly intact, nares w/o erythema or drainage, oropharynx w/o Erythema/Exudate Eyes: Sclera non-icteric, conjunctiva clear Neck: Trachea midline.  No JVD.  Pulmonary:  Good air movement, respirations not labored, equal bilaterally.  Cardiac: RRR, normal S1, S2. Vascular: Faint dopplerable pulses of DP/PT right lower extremity  Musculoskeletal: Left-sided hemiplegia with left lower extremity contracture Neurologic: Altered mental status Psychiatric: Alert to family Dermatologic: See attached picture of wound    CBC Lab Results  Component Value Date   WBC 8.5 05/11/2021   HGB 8.9 (L) 05/11/2021   HCT 30.2 (L) 05/11/2021   MCV 86.0 05/11/2021   PLT 128 (L) 05/11/2021    BMET    Component Value Date/Time   NA 150 (H) 05/11/2021 0507   K 4.0 05/11/2021 0507   CL 122 (H) 05/11/2021 0507   CO2 24 05/11/2021 0507   GLUCOSE 104 (H) 05/11/2021 0507   BUN 39 (H) 05/11/2021 0507   CREATININE 1.18 05/11/2021 0507   CALCIUM 9.0 05/11/2021 0507   GFRNONAA >60 05/11/2021 0507   Estimated Creatinine Clearance: 60.1 mL/min (by C-G formula based on SCr of 1.18 mg/dL).  COAG Lab Results  Component Value Date   INR 1.7 (H) 05/10/2021   INR 1.2 11/02/2020   INR 1.4 (H) 10/14/2020    Radiology CT ANGIO HEAD NECK W WO CM  Result Date: 05/11/2021 CLINICAL DATA:  Initial evaluation for neuro deficit, stroke suspected. EXAM: CT ANGIOGRAPHY HEAD AND NECK TECHNIQUE: Multidetector CT imaging of the head and neck was performed using the standard protocol during bolus administration of intravenous contrast. Multiplanar CT image reconstructions and MIPs were obtained to evaluate the vascular anatomy. Carotid stenosis measurements (when applicable) are obtained utilizing NASCET criteria, using the distal internal carotid diameter as the denominator. RADIATION DOSE REDUCTION: This exam was  performed according to the departmental dose-optimization program which includes automated exposure control, adjustment of the mA and/or kV according to patient size and/or use of iterative reconstruction technique. CONTRAST:  36m OMNIPAQUE IOHEXOL 350 MG/ML SOLN COMPARISON:  Comparison made with prior head CT from earlier the same day. FINDINGS: CTA NECK FINDINGS Aortic arch: Visualized aortic arch normal caliber with normal branch pattern. Mild atheromatous change about the arch itself. No hemodynamically significant stenosis about the origin of the great vessels. Right carotid system: Right CCA tortuous but widely patent to the bifurcation. There is abrupt occlusion of the proximal cervical right ICA just distal to the bifurcation, likely chronic in nature given the large chronic right cerebral infarct seen on prior CT. Right ICA remains occluded to the skull base. Left carotid system: Left common and internal carotid arteries are somewhat tortuous but widely patent without stenosis or dissection. Vertebral arteries: Both vertebral arteries arise from the subclavian arteries. No proximal subclavian artery stenosis. Proximal left vertebral artery not well seen due to adjacent venous contamination. Visualized portions of the vertebral arteries patent without stenosis or dissection. Skeleton: Visualized osseous structures are somewhat heterogeneous without definite discrete lytic or blastic osseous lesion. Mild-to-moderate multilevel cervical spondylosis without high-grade spinal stenosis. Patient is edentulous. Other neck: No other acute soft tissue abnormality within the neck. 6 mm right thyroid nodule noted, of doubtful significance given size and patient age, no follow-up imaging recommended (ref: J Am Coll Radiol. 2015 Feb;12(2): 143-50). Upper chest: Visualized upper chest demonstrates no acute finding. Review of the MIP images confirms the above findings CTA HEAD FINDINGS Anterior circulation: Petrous left  ICA widely patent. Atheromatous change within the left carotid siphon without hemodynamically significant stenosis. Right ICA remains occluded to the terminus. A1 segments patent bilaterally. Right A1 hypoplastic. Normal anterior communicating artery complex. Both anterior cerebral arteries widely patent to their distal aspects. Left M1 widely patent.  Normal left MCA bifurcation. Distal left MCA branches widely patent. Perfusion of the right M1 segment via collateral flow across the circle-of-Willis. Right M1 is somewhat diminutive with probable short-segment moderate stenosis at its distal aspect (series 9, image 53). Normal right MCA bifurcation. Right MCA branches attenuated but grossly patent distally. Posterior circulation: Both vertebral arteries patent without hemodynamically significant stenosis. Both PICA patent. Basilar patent to its distal aspect without stenosis. Superior cerebellar and posterior cerebral arteries widely patent bilaterally. Venous sinuses: Patent allowing for timing the contrast bolus. Anatomic variants: None significant.  No aneurysm. Review of the MIP images confirms the above findings IMPRESSION: 1. 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 Right ICA remains occluded to the terminus. Right MCA and its branches are attenuated but perfused via collateral flow across the circle-of-Willis. 2. Mild atheromatous change about the left carotid bifurcation and left carotid siphon, but no hemodynamically significant or correctable stenosis. 3. Diffuse tortuosity of the major arterial vasculature of the head and neck, suggesting chronic underlying hypertension. Electronically Signed   By: Jeannine Boga M.D.   On: 05/11/2021 00:11   CT HEAD WO CONTRAST (5MM)  Result Date: 05/10/2021 CLINICAL DATA:  Mental status change.  History of stroke EXAM: CT HEAD WITHOUT CONTRAST TECHNIQUE: Contiguous axial images were obtained from the base of the skull through the  vertex without intravenous contrast. RADIATION DOSE REDUCTION: This exam was performed according to the departmental dose-optimization program which includes automated exposure control, adjustment of the mA and/or kV according to patient size and/or use of iterative reconstruction technique. COMPARISON:  CT head 11/16/2020 FINDINGS: Brain: Encephalomalacia in the for right frontal parietal and temporal lobe compatible with chronic infarct. Dystrophic calcification in the infarct has developed since the prior study. There is also chronic infarct involving the right occipital lobe. Territory similar to the prior study. New area of hypodensity right thalamus and right splenium. Negative for acute   hemorrhage, mass.  Negative for hydrocephalus. Vascular: Negative for hyperdense vessel Skull: No focal abnormality Sinuses/Orbits: Paranasal sinuses clear.  Negative orbit Other: None IMPRESSION: Chronic right MCA infarct. Hypodensity in the right thalamus and splenium of the corpus callosum. This was not identified on the prior MRI 10/28/2020 and could be a new area of infarct. Negative for hemorrhage. Electronically Signed   By: Franchot Gallo M.D.   On: 05/10/2021 15:39   DG Foot Complete Right  Result Date: 05/10/2021 CLINICAL DATA:  Concern for osteomyelitis of the right great toe EXAM: RIGHT FOOT COMPLETE - 3+ VIEW COMPARISON:  None. FINDINGS: Erosive changes along the lateral margin of the tuft of the great toe distal phalanx compatible with acute osteomyelitis. No additional sites of focal bone erosion are seen. No fracture or dislocation. Degenerative changes are most pronounced at the first and second MTP joints. There is soft tissue swelling about the foot. IMPRESSION: Acute osteomyelitis of the right great toe distal phalanx. Electronically Signed   By: Davina Poke D.O.   On: 05/10/2021 15:29      Assessment/Plan 1.  The noninvasive imaging previously done was of left lower extremity which showed  that the femoral occlusion however based on the exam of the gangrenous changes to the right great toe with the diminished dopplerable pulses, it would be prudent to proceed with angiogram of the right lower extremity.  As far as the known femoral occlusion in the left lower extremity, this can be addressed following revascularization of the right lower extremity.  Given that  the patient's left lower extremity is contracted it would be difficult for endovascular intervention, if not impossible.  I discussed the risk benefits and alternatives with the patient's brother currently at bedside who agrees to proceed.   Kris Hartmann, NP  05/11/2021 3:04 PM    This note was created with Dragon medical transcription system.  Any error is purely unintentional

## 2021-05-11 NOTE — Progress Notes (Signed)
Initial Nutrition Assessment ? ?DOCUMENTATION CODES:  ? ?Non-severe (moderate) malnutrition in context of chronic illness ? ?INTERVENTION:  ? ?Initiate Osmolite 1.5 @ 20 ml/hr via PEG and increase by 10 ml every 4 hours to goal rate of 60 ml/hr.  ? ?45 ml Prosource TF daily ? ?155 ml free water flush every 4 hours ? ?Tube feeding regimen provides 2200 kcal (100% of needs), 101 grams of protein, and 1097 ml of H2O.   ? ?NUTRITION DIAGNOSIS:  ? ?Moderate Malnutrition related to chronic illness (CVA) as evidenced by mild fat depletion, moderate fat depletion, mild muscle depletion, moderate muscle depletion. ? ?GOAL:  ? ?Patient will meet greater than or equal to 90% of their needs ? ?MONITOR:  ? ?Diet advancement, Labs, Weight trends, TF tolerance, Skin, I & O's ? ?REASON FOR ASSESSMENT:  ? ?Consult ?Enteral/tube feeding initiation and management ? ?ASSESSMENT:  ? ?Clifford [name] is a 71 y.o. male with medical history significant of BPH, HTN, HLD, Tobacco abuse, PVD,PE on eliquis, PEG feeds , and CVA coming for abnormal USG. ? ?Pt admitted with AMS and acute osteomyelitis of rt toe.  ? ?Reviewed I/O's: -100 ml x 24 hours ? ?UOP: 200 ml x 24 hours  ? ?Pt lying in bed at time of visit. Pt opened his eyes and responded to some close ended questions, but responses were delayed. Case discussed with SLP, who reports similar experience when she evaluated a few hours later. Per discussion with RN form nursing home, SLP reports that pt consumed some pureed foods and received additional support from nocturnal feedings (Osmolite 1.5 @ 120 ml/hr x 12 hours, 30 ml Prosource Plus x 1, and 200 ml free water flush every 4 hours).  ? ?Per SLP, pt to remain NPO. Case discussed with MD; agree with NPO order and plan to change to continuous (24 hour) feeds.  ? ?Reviewed wt hx; pt has experienced a 7.9% wt loss over the past 7 months. While this is not significant for time frame, it is concerning given multiple comorbidities.   ? ?Medications reviewed and include keppra.   ? ?Lab Results  ?Component Value Date  ? HGBA1C 5.9 (H) 10/07/2020  ? PTA DM medications are 0-9 units insulin aspart every 4 hours.  ? ?Labs reviewed: Na: 150, CBGS: 130 (inpatient orders for glycemic control are none).   ? ?NUTRITION - FOCUSED PHYSICAL EXAM: ? ?Flowsheet Row Most Recent Value  ?Orbital Region Mild depletion  ?Upper Arm Region Mild depletion  ?Thoracic and Lumbar Region Mild depletion  ?Buccal Region No depletion  ?Temple Region Moderate depletion  ?Clavicle Bone Region Mild depletion  ?Clavicle and Acromion Bone Region Mild depletion  ?Scapular Bone Region Mild depletion  ?Dorsal Hand No depletion  ?Patellar Region Moderate depletion  ?Anterior Thigh Region Moderate depletion  ?Posterior Calf Region Moderate depletion  ?Edema (RD Assessment) Mild  ?Hair Reviewed  ?Eyes Reviewed  ?Mouth Reviewed  ?Skin Reviewed  ?Nails Reviewed  ? ?  ? ? ?Diet Order:   ?Diet Order   ? ?       ?  Diet NPO time specified  Diet effective now       ?  ? ?  ?  ? ?  ? ? ?EDUCATION NEEDS:  ? ?Not appropriate for education at this time ? ?Skin:  Skin Assessment: Reviewed RN Assessment ? ?Last BM:  Unknown ? ?Height:  ? ?Ht Readings from Last 1 Encounters:  ?05/10/21 '5\' 7"'$  (1.702 m)  ? ? ?Weight:  ? ?  Wt Readings from Last 1 Encounters:  ?05/10/21 88.5 kg  ? ? ?Ideal Body Weight:  67.3 kg ? ?BMI:  Body mass index is 30.54 kg/m?. ? ?Estimated Nutritional Needs:  ? ?Kcal:  2000-2200 ? ?Protein:  100-115 grams ? ?Fluid:  > 2 L ? ? ? ?Loistine Chance, RD, LDN, CDCES ?Registered Dietitian II ?Certified Diabetes Care and Education Specialist ?Please refer to [facility] for RD and/or RD on-call/weekend/after hours pager  ?

## 2021-05-11 NOTE — Progress Notes (Signed)
PROGRESS NOTE   Clifford [name]  MWN:[id number]    DOB: March 06, 1951    DOA: 05/10/2021  PCP: Pcp, No   I have briefly reviewed patients previous medical records in [facility].  Chief Complaint  Patient presents with   femoral occlusion    Hospital Course:  71 year old male, SNF resident, medical history significant for HTN, pulmonary embolism on Eliquis, HLD, PAD, prediabetes, CVA with residual left hemiplegia, PEG tube dependent, nonverbal, sent from SNF due to abnormal USG that showed abnormal circulation.  In ED, dopplerable pulses in both feet.  Podiatry/Dr. Amalia Hailey consulted for suspected osteomyelitis of right first toe.  Vascular surgery consulted as well.   Assessment & Plan:  Principal Problem:   Acute osteomyelitis of toe, right (HCC) Active Problems:   PAD (peripheral artery disease) (HCC)   AMS (altered mental status)   Stage 3a chronic kidney disease (CKD) (HCC)   Essential hypertension   Chronic diastolic congestive heart failure (HCC)   History of pulmonary embolism   Chronic anticoagulation   Hypernatremia   Lactic acidosis   Thrombocytopenia (HCC)   Elevated troponin level   Normocytic anemia   Failure to thrive in adult   Assessment and Plan: * Acute osteomyelitis of toe, right ([place]) Right hallux gangrene Podiatry/Dr. Amalia Hailey consultation appreciated Suspect gangrene and osteomyelitis of right hallux For now continue IV vancomycin and cefepime.  Consider ID consultation if not improving. Unable to complete MRI of lower extremity due to left sided contractures. As per podiatry recommendations, vascular surgery consulted to determine PAD status for any potential surgical intervention Suspects he will need at least a partial or total hallux amputation.  PAD (peripheral artery disease) ([place]) As per triage notes, presented to the ED via EMS from [facility] for right femoral artery occlusion Imaging was done 3/4 but did not receive results until day of ED  visit. Vascular surgery/Dr. Lucky Cowboy consulted.  AMS (altered mental status) As per discussion with patient's brother on 3/8, up to 2 to 3 days ago, patient reportedly was conversing with him and appropriately so.  The AMS seems to be subacute or acute. Etiology unclear.?  Infectious.?  Suspect acute CVA.  CT head shows chronic right MCA infarct.  Hypodensity in the right thalamus and splenium of the corpus callosum which was not identified on prior MRI 10/28/2020 and could be a new area of infarct.  No hemorrhage.  Patient unable to tolerate MRI due to left-sided contractures. Pt is on eliquis which we will continue Overall poor prognosis due to advanced age, debilitating CVA and multiple comorbidities and current acute illness.  Stage 3a chronic kidney disease (CKD) ([place]) Unclear if he actually has stage II CKD or stage IIIa. Creatinine over the last 2 days is in the 1.1 range.  Essential hypertension Controlled.  Chronic diastolic congestive heart failure (HCC) Compensated.  Chronic anticoagulation Eliquis on hold.  History of pulmonary embolism Was on Eliquis PTA.  Currently appears to be on hold per surgical team for possible interventions. IV heparin while Eliquis on hold.  Failure to thrive in adult Extremely poor prognosis. Palliative care consultation for [place] discussions.  Normocytic anemia No overt bleeding. Follow CBC in AM.  Transfuse if hemoglobin 7 g or less.  Elevated troponin level Creatinine elevated in the 120-130 range, flat trend.  No history of chest pain available.   ekg shows sr and mild twi in lateral leads.  Suspect demand ischemia.we will obtain echo.  With his h/o anemia we will  cont eliquis wit no addition of additional Antiplatelet and anticoagulation.  Pt is also thrombocytopenic.  Last echo 8/22: LVEF 65-70%.  Thrombocytopenia ([place]) Appears relatively stable.  Follow daily CBC.  Lactic acidosis Suspected due to hypotension and poor tissue  perfusion.  Serum lactate has fluctuated, peaked at 2.3 and down to 2.  Hypernatremia Serum sodium has actually increased from 1 46-1 50. Likely related to poor oral intake/inadequate free water through PEG tube Continue free water via PEG tube and will also initiate IV D5 infusion. Follow BMP in AM.  Body mass index is 30.54 kg/m.  Nutritional Status        Pressure Ulcer:     DVT prophylaxis:   SCDs.  Eliquis on hold.   Code Status: DNR:  Family Communication: Discussed in detail with patient's brother. Disposition:  Status is: Inpatient Remains inpatient appropriate because: Severity of illness, possible need for interventions    Consultants:   Podiatry Vascular surgery  Procedures:     Antimicrobials:   As above   Subjective:  Patient nonverbal, does not follow any instructions.  No history obtainable from him.  As per brother, patient was conversant up to 2 to 3 days ago when he had AMS.  Objective:   Vitals:   05/11/21 0158 05/11/21 0411 05/11/21 0719 05/11/21 1138  BP: 109/66 138/74 (!) 147/71 132/82  Pulse: 67 71 70 84  Resp: '16 14 16 16  '$ Temp: 98.8 F (37.1 C) 98.1 F (36.7 C) 97.9 F (36.6 C) 98.1 F (36.7 C)  TempSrc: Oral Oral Oral Oral  SpO2: 100% 100% 100% 100%  Weight:      Height:        General exam: Elderly male, moderately built and poorly nourished lying propped up in bed comfortably. Respiratory system: Clear to auscultation but poor inspiratory effort. Respiratory effort normal. Cardiovascular system: S1 & S2 heard, RRR. No JVD, murmurs, rubs, gallops or clicks. No pedal edema.  Telemetry personally reviewed: Sinus rhythm. Gastrointestinal system: Abdomen is nondistended, soft and nontender. No organomegaly or masses felt. Normal bowel sounds heard. Central nervous system: Lethargic, intermittently opens eyes but does not track.  Nonverbal.  Bilateral immature cataracts and arcus analysis.  No gaze palsy.  No nystagmus.  No facial  asymmetry.  Nonverbal and does not follow instructions. Extremities: Occasional purposeful movements of right hand.  Left sided hemiparesis with contractures.  Left elbow/forearm flexed contracted across abdomen.  Left lower extremity contracted at the knee. Skin: As per RN, has sacral ulcer, bilateral heel ulcers.  Exam as per [place] RN. Psychiatry: Judgement and insight impaired. Mood & affect cannot be assessed.    Data Reviewed:   I have personally reviewed following labs and imaging studies   CBC: Recent Labs  Lab 05/10/21 1521 05/11/21 0507  WBC 7.4 8.5  NEUTROABS 5.1  --   HGB 10.2* 8.9*  HCT 34.7* 30.2*  MCV 86.8 86.0  PLT 142* 128*    Basic Metabolic Panel: Recent Labs  Lab 05/10/21 1839 05/11/21 0507  NA 146* 150*  K 4.4 4.0  CL 115* 122*  CO2 21* 24  GLUCOSE 106* 104*  BUN 43* 39*  CREATININE 1.12 1.18  CALCIUM 9.1 9.0  MG 2.0  --     Liver Function Tests: Recent Labs  Lab 05/10/21 1839  AST 36  ALT 28  ALKPHOS 60  BILITOT 0.8  PROT 7.0  ALBUMIN 2.9*    CBG: No results for input(s): GLUCAP in the last 168 hours.  Microbiology Studies:   Recent Results (from the past 240 hour(s))  Resp Panel by RT-PCR (Flu A&B, Covid) Nasopharyngeal Swab     Status: None   Collection Time: 05/10/21  3:21 PM   Specimen: Nasopharyngeal Swab; Nasopharyngeal(NP) swabs in vial transport medium  Result Value Ref Range Status   SARS Coronavirus 2 by RT PCR NEGATIVE NEGATIVE Final    Comment: (NOTE) SARS-CoV-2 target nucleic acids are NOT DETECTED.  The SARS-CoV-2 RNA is generally detectable in upper respiratory specimens during the acute phase of infection. The lowest concentration of SARS-CoV-2 viral copies this assay can detect is 138 copies/mL. A negative result does not preclude SARS-Cov-2 infection and should not be used as the sole basis for treatment or other patient management decisions. A negative result may occur with  improper specimen  collection/handling, submission of specimen other than nasopharyngeal swab, presence of viral mutation(s) within the areas targeted by this assay, and inadequate number of viral copies(<138 copies/mL). A negative result must be combined with clinical observations, patient history, and epidemiological information. The expected result is Negative.  Fact Sheet for Patients:  EntrepreneurPulse.com.au  Fact Sheet for Healthcare Providers:  IncredibleEmployment.be  This test is no t yet approved or cleared by the [place] FDA and  has been authorized for detection and/or diagnosis of SARS-CoV-2 by FDA under an Emergency Use Authorization (EUA). This EUA will remain  in effect (meaning this test can be used) for the duration of the COVID-19 declaration under Section 564(b)(1) of the Act, 21 U.S.C.section 360bbb-3(b)(1), unless the authorization is terminated  or revoked sooner.       Influenza A by PCR NEGATIVE NEGATIVE Final   Influenza B by PCR NEGATIVE NEGATIVE Final    Comment: (NOTE) The Xpert Xpress SARS-CoV-2/FLU/RSV plus assay is intended as an aid in the diagnosis of influenza from Nasopharyngeal swab specimens and should not be used as a sole basis for treatment. Nasal washings and aspirates are unacceptable for Xpert Xpress SARS-CoV-2/FLU/RSV testing.  Fact Sheet for Patients: EntrepreneurPulse.com.au  Fact Sheet for Healthcare Providers: IncredibleEmployment.be  This test is not yet approved or cleared by the [place] FDA and has been authorized for detection and/or diagnosis of SARS-CoV-2 by FDA under an Emergency Use Authorization (EUA). This EUA will remain in effect (meaning this test can be used) for the duration of the COVID-19 declaration under Section 564(b)(1) of the Act, 21 U.S.C. section 360bbb-3(b)(1), unless the authorization is terminated or revoked.  Performed at [facility], [address] [place], [place] [address]   Blood culture (routine x 2)     Status: None (Preliminary result)   Collection Time: 05/10/21  6:39 PM   Specimen: BLOOD  Result Value Ref Range Status   Specimen Description BLOOD LEFT HAND  Final   Special Requests   Final    BOTTLES DRAWN AEROBIC ONLY Blood Culture adequate volume   Culture   Final    NO GROWTH < 12 HOURS Performed at [facility], [address], [place], [place] [address]    Report Status PENDING  Incomplete  Culture, blood (Routine X 2) w Reflex to ID Panel     Status: None (Preliminary result)   Collection Time: 05/11/21  1:59 AM   Specimen: BLOOD  Result Value Ref Range Status   Specimen Description BLOOD RIGHT ASSIST CONTROL  Final   Special Requests   Final    BOTTLES DRAWN AEROBIC AND ANAEROBIC Blood Culture adequate volume   Culture  Final    NO GROWTH < 12 HOURS Performed at [facility], [address] [place], [place] [address]    Report Status PENDING  Incomplete    Radiology Studies:  CT ANGIO HEAD NECK W WO CM  Result Date: 05/11/2021 CLINICAL DATA:  Initial evaluation for neuro deficit, stroke suspected. EXAM: CT ANGIOGRAPHY HEAD AND NECK TECHNIQUE: Multidetector CT imaging of the head and neck was performed using the standard protocol during bolus administration of intravenous contrast. Multiplanar CT image reconstructions and MIPs were obtained to evaluate the vascular anatomy. Carotid stenosis measurements (when applicable) are obtained utilizing NASCET criteria, using the distal internal carotid diameter as the denominator. RADIATION DOSE REDUCTION: This exam was performed according to the departmental dose-optimization program which includes automated exposure control, adjustment of the mA and/or kV according to patient size and/or use of iterative reconstruction technique. CONTRAST:  85m OMNIPAQUE IOHEXOL 350 MG/ML SOLN COMPARISON:  Comparison made with prior  head CT from earlier the same day. FINDINGS: CTA NECK FINDINGS Aortic arch: Visualized aortic arch normal caliber with normal branch pattern. Mild atheromatous change about the arch itself. No hemodynamically significant stenosis about the origin of the great vessels. Right carotid system: Right CCA tortuous but widely patent to the bifurcation. There is abrupt occlusion of the proximal cervical right ICA just distal to the bifurcation, likely chronic in nature given the large chronic right cerebral infarct seen on prior CT. Right ICA remains occluded to the skull base. Left carotid system: Left common and internal carotid arteries are somewhat tortuous but widely patent without stenosis or dissection. Vertebral arteries: Both vertebral arteries arise from the subclavian arteries. No proximal subclavian artery stenosis. Proximal left vertebral artery not well seen due to adjacent venous contamination. Visualized portions of the vertebral arteries patent without stenosis or dissection. Skeleton: Visualized osseous structures are somewhat heterogeneous without definite discrete lytic or blastic osseous lesion. Mild-to-moderate multilevel cervical spondylosis without high-grade spinal stenosis. Patient is edentulous. Other neck: No other acute soft tissue abnormality within the neck. 6 mm right thyroid nodule noted, of doubtful significance given size and patient age, no follow-up imaging recommended (ref: J Am Coll Radiol. 2015 Feb;12(2): 143-50). Upper chest: Visualized upper chest demonstrates no acute finding. Review of the MIP images confirms the above findings CTA HEAD FINDINGS Anterior circulation: Petrous left ICA widely patent. Atheromatous change within the left carotid siphon without hemodynamically significant stenosis. Right ICA remains occluded to the terminus. A1 segments patent bilaterally. Right A1 hypoplastic. Normal anterior communicating artery complex. Both anterior cerebral arteries widely patent  to their distal aspects. Left M1 widely patent. Normal left MCA bifurcation. Distal left MCA branches widely patent. Perfusion of the right M1 segment via collateral flow across the circle-of-Willis. Right M1 is somewhat diminutive with probable short-segment moderate stenosis at its distal aspect (series 9, image 53). Normal right MCA bifurcation. Right MCA branches attenuated but grossly patent distally. Posterior circulation: Both vertebral arteries patent without hemodynamically significant stenosis. Both PICA patent. Basilar patent to its distal aspect without stenosis. Superior cerebellar and posterior cerebral arteries widely patent bilaterally. Venous sinuses: Patent allowing for timing the contrast bolus. Anatomic variants: None significant.  No aneurysm. Review of the MIP images confirms the above findings IMPRESSION: 1. 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 Right ICA remains occluded to the terminus. Right MCA and its branches are attenuated but perfused via collateral flow across the circle-of-Willis. 2. Mild atheromatous change about the left carotid bifurcation and left  carotid siphon, but no hemodynamically significant or correctable stenosis. 3. Diffuse tortuosity of the major arterial vasculature of the head and neck, suggesting chronic underlying hypertension. Electronically Signed   By: Jeannine Boga M.D.   On: 05/11/2021 00:11   CT HEAD WO CONTRAST (5MM)  Result Date: 05/10/2021 CLINICAL DATA:  Mental status change.  History of stroke EXAM: CT HEAD WITHOUT CONTRAST TECHNIQUE: Contiguous axial images were obtained from the base of the skull through the vertex without intravenous contrast. RADIATION DOSE REDUCTION: This exam was performed according to the departmental dose-optimization program which includes automated exposure control, adjustment of the mA and/or kV according to patient size and/or use of iterative reconstruction technique. COMPARISON:   CT head 11/16/2020 FINDINGS: Brain: Encephalomalacia in the for right frontal parietal and temporal lobe compatible with chronic infarct. Dystrophic calcification in the infarct has developed since the prior study. There is also chronic infarct involving the right occipital lobe. Territory similar to the prior study. New area of hypodensity right thalamus and right splenium. Negative for acute   hemorrhage, mass.  Negative for hydrocephalus. Vascular: Negative for hyperdense vessel Skull: No focal abnormality Sinuses/Orbits: Paranasal sinuses clear.  Negative orbit Other: None IMPRESSION: Chronic right MCA infarct. Hypodensity in the right thalamus and splenium of the corpus callosum. This was not identified on the prior MRI 10/28/2020 and could be a new area of infarct. Negative for hemorrhage. Electronically Signed   By: Franchot Gallo M.D.   On: 05/10/2021 15:39   DG Foot Complete Right  Result Date: 05/10/2021 CLINICAL DATA:  Concern for osteomyelitis of the right great toe EXAM: RIGHT FOOT COMPLETE - 3+ VIEW COMPARISON:  None. FINDINGS: Erosive changes along the lateral margin of the tuft of the great toe distal phalanx compatible with acute osteomyelitis. No additional sites of focal bone erosion are seen. No fracture or dislocation. Degenerative changes are most pronounced at the first and second MTP joints. There is soft tissue swelling about the foot. IMPRESSION: Acute osteomyelitis of the right great toe distal phalanx. Electronically Signed   By: Davina Poke D.O.   On: 05/10/2021 15:29    Scheduled Meds:    atorvastatin  40 mg Per Tube QHS   famotidine  20 mg Per Tube BID   free water  100 mL Per Tube Q4H   levETIRAcetam  250 mg Per Tube BID   pantoprazole sodium  40 mg Per Tube BID   QUEtiapine  25 mg Per Tube QHS   topiramate  50 mg Per Tube BID    Continuous Infusions:    ceFEPime (MAXIPIME) IV 2 g (05/11/21 1426)   vancomycin       LOS: 1 day     Vernell Leep, MD,   FACP, [facility], [facility], [facility] (Care Management Physician Certified) [place]  To contact the attending provider between 7A-7P or the covering provider during after hours 7P-7A, please log into the web site [email or web address] and access using universal [place] password for that web site. If you do not have the password, please call the hospital operator.  05/11/2021, 3:38 PM

## 2021-05-11 NOTE — Assessment & Plan Note (Addendum)
Extremely poor prognosis. ?Palliative care on board with ongoing [place] discussions with patient's daughter. ?

## 2021-05-11 NOTE — Hospital Course (Addendum)
71 year old male, SNF resident, medical history significant for HTN, pulmonary embolism on Eliquis, HLD, PAD, prediabetes, CVA with residual left hemiplegia, PEG tube dependent, nonverbal, sent from SNF due to abnormal USG that showed abnormal circulation.  In ED, dopplerable pulses in both feet.  Podiatry/Dr. Amalia Hailey consulted for suspected osteomyelitis of right first toe.  Vascular surgery consulted.  Unsuccessful attempt to do lower extremity angiogram.  Podiatry was planning right hallux amputation 3/11 but after d/w daughter, this was canceled because she was considering hospice. ? ?On 3/11, patient's daughter after discussing with her siblings transitioned to full comfort care/hospice.  DC to SNF with hospice.  TOC to coordinate. ?

## 2021-05-12 ENCOUNTER — Encounter
Admission: EM | Disposition: A | Discharge status: Skilled Nursing Facility | Payer: Self-pay | Source: Home / Self Care | Attending: Internal Medicine

## 2021-05-12 ENCOUNTER — Encounter: Payer: Self-pay | Admitting: Vascular Surgery

## 2021-05-12 DIAGNOSIS — E440 Moderate protein-calorie malnutrition: Secondary | ICD-10-CM | POA: Insufficient documentation

## 2021-05-12 DIAGNOSIS — Z7189 Other specified counseling: Secondary | ICD-10-CM | POA: Diagnosis not present

## 2021-05-12 DIAGNOSIS — R627 Adult failure to thrive: Secondary | ICD-10-CM | POA: Diagnosis not present

## 2021-05-12 DIAGNOSIS — M86171 Other acute osteomyelitis, right ankle and foot: Secondary | ICD-10-CM | POA: Diagnosis not present

## 2021-05-12 DIAGNOSIS — Z515 Encounter for palliative care: Secondary | ICD-10-CM | POA: Diagnosis not present

## 2021-05-12 HISTORY — PX: LOWER EXTREMITY ANGIOGRAPHY: CATH118251

## 2021-05-12 LAB — BASIC METABOLIC PANEL
Anion gap: 10 (ref 5–15)
BUN: 36 mg/dL — ABNORMAL HIGH (ref 8–23)
CO2: 20 mmol/L — ABNORMAL LOW (ref 22–32)
Calcium: 8.7 mg/dL — ABNORMAL LOW (ref 8.9–10.3)
Chloride: 113 mmol/L — ABNORMAL HIGH (ref 98–111)
Creatinine, Ser: 0.94 mg/dL (ref 0.61–1.24)
GFR, Estimated: >60 mL/min (ref >60)
Glucose, Bld: 108 mg/dL — ABNORMAL HIGH (ref 70–99)
Potassium: 3.2 mmol/L — ABNORMAL LOW (ref 3.5–5.1)
Sodium: 143 mmol/L (ref 135–145)

## 2021-05-12 LAB — APTT
aPTT: >200 seconds (ref 24–36)
aPTT: >200 seconds (ref 24–36)

## 2021-05-12 LAB — GLUCOSE, CAPILLARY
Glucose-Capillary: 107 mg/dL — ABNORMAL HIGH (ref 70–99)
Glucose-Capillary: 107 mg/dL — ABNORMAL HIGH (ref 70–99)
Glucose-Capillary: 86 mg/dL (ref 70–99)
Glucose-Capillary: 98 mg/dL (ref 70–99)
Glucose-Capillary: 98 mg/dL (ref 70–99)

## 2021-05-12 LAB — PHOSPHORUS
Phosphorus: 3.4 mg/dL (ref 2.5–4.6)
Phosphorus: 2.9 mg/dL (ref 2.5–4.6)

## 2021-05-12 LAB — CBC
HCT: 29 % — ABNORMAL LOW (ref 39.0–52.0)
Hemoglobin: 8.8 g/dL — ABNORMAL LOW (ref 13.0–17.0)
MCH: 26 pg (ref 26.0–34.0)
MCHC: 30.3 g/dL (ref 30.0–36.0)
MCV: 85.5 fL (ref 80.0–100.0)
Platelets: 139 10*3/uL — ABNORMAL LOW (ref 150–400)
RBC: 3.39 MIL/uL — ABNORMAL LOW (ref 4.22–5.81)
RDW: 15 % (ref 11.5–15.5)
WBC: 6 10*3/uL (ref 4.0–10.5)
nRBC: 0 % (ref 0.0–0.2)

## 2021-05-12 LAB — HEPARIN LEVEL (UNFRACTIONATED): Heparin Unfractionated: >1.1 IU/mL — ABNORMAL HIGH (ref 0.30–0.70)

## 2021-05-12 LAB — BLOOD GAS, VENOUS
Acid-Base Excess: 1.9 mmol/L (ref 0.0–2.0)
Bicarbonate: 28.2 mmol/L — ABNORMAL HIGH (ref 20.0–28.0)
Patient temperature: 37
pCO2, Ven: 50 mmHg (ref 44–60)
pH, Ven: 7.36 (ref 7.25–7.43)

## 2021-05-12 LAB — BUN: BUN: 34 mg/dL — ABNORMAL HIGH (ref 8–23)

## 2021-05-12 LAB — MAGNESIUM
Magnesium: 1.9 mg/dL (ref 1.7–2.4)
Magnesium: 1.9 mg/dL (ref 1.7–2.4)

## 2021-05-12 LAB — CREATININE, SERUM
Creatinine, Ser: 1.01 mg/dL (ref 0.61–1.24)
GFR, Estimated: >60 mL/min (ref >60)

## 2021-05-12 SURGERY — LOWER EXTREMITY ANGIOGRAPHY
Anesthesia: Moderate Sedation | Laterality: Right

## 2021-05-12 MED ORDER — DIPHENHYDRAMINE HCL 50 MG/ML IJ SOLN: 50.0000 mg | Freq: Once | INTRAMUSCULAR | Status: DC | PRN

## 2021-05-12 MED ORDER — CEFAZOLIN SODIUM-DEXTROSE 2-4 GM/100ML-% IV SOLN: 2.0000 g | Freq: Once | INTRAVENOUS | Status: DC

## 2021-05-12 MED ORDER — HEPARIN (PORCINE) 25000 UT/250ML-% IV SOLN
800.0000 [IU]/h | INTRAVENOUS | Status: DC
  Administered 2021-05-12: 21:00:00 800 [IU]/h via INTRAVENOUS
  Filled 2021-05-12: qty 250

## 2021-05-12 MED ORDER — HEPARIN BOLUS VIA INFUSION
2100.0000 [IU] | Freq: Once | INTRAVENOUS | Status: AC
  Administered 2021-05-12: 22:00:00 2100 [IU] via INTRAVENOUS
  Filled 2021-05-12: qty 2100

## 2021-05-12 MED ORDER — FENTANYL CITRATE PF 50 MCG/ML IJ SOSY
PREFILLED_SYRINGE | INTRAMUSCULAR | Status: AC
  Filled 2021-05-12: qty 1

## 2021-05-12 MED ORDER — HYDROMORPHONE HCL 1 MG/ML IJ SOLN
1.0000 mg | Freq: Once | INTRAMUSCULAR | Status: AC | PRN
  Administered 2021-05-14: 1 mg via INTRAVENOUS
  Filled 2021-05-12: qty 1

## 2021-05-12 MED ORDER — POTASSIUM CHLORIDE 20 MEQ PO PACK
40.0000 meq | PACK | Freq: Once | ORAL | Status: AC
  Administered 2021-05-12: 18:00:00 40 meq
  Filled 2021-05-12: qty 2

## 2021-05-12 MED ORDER — FENTANYL CITRATE (PF) 100 MCG/2ML IJ SOLN
INTRAMUSCULAR | Status: DC | PRN
  Administered 2021-05-12 (×3): 25 ug via INTRAVENOUS

## 2021-05-12 MED ORDER — FAMOTIDINE 20 MG PO TABS
40.0000 mg | ORAL_TABLET | Freq: Once | ORAL | Status: DC | PRN
Start: 2021-05-12 — End: 2021-05-12

## 2021-05-12 MED ORDER — SODIUM CHLORIDE 0.9 % IV SOLN: INTRAVENOUS | Status: DC

## 2021-05-12 MED ORDER — CEFAZOLIN SODIUM-DEXTROSE 2-4 GM/100ML-% IV SOLN
INTRAVENOUS | Status: AC
  Filled 2021-05-12: qty 100

## 2021-05-12 MED ORDER — MIDAZOLAM HCL 2 MG/2ML IJ SOLN
INTRAMUSCULAR | Status: AC
Start: 2021-05-12 — End: 2021-05-13
  Filled 2021-05-12: qty 2

## 2021-05-12 MED ORDER — HEPARIN SODIUM (PORCINE) 1000 UNIT/ML IJ SOLN
INTRAMUSCULAR | Status: AC
  Filled 2021-05-12: qty 10

## 2021-05-12 MED ORDER — HEPARIN (PORCINE) 25000 UT/250ML-% IV SOLN
800.0000 [IU]/h | INTRAVENOUS | Status: DC
  Administered 2021-05-12: 22:00:00 800 [IU]/h via INTRAVENOUS
  Administered 2021-05-13: 750 [IU]/h via INTRAVENOUS
  Filled 2021-05-12: qty 250

## 2021-05-12 MED ORDER — MIDAZOLAM HCL 2 MG/ML PO SYRP: 8.0000 mg | ORAL_SOLUTION | Freq: Once | ORAL | Status: DC | PRN

## 2021-05-12 MED ORDER — HEPARIN (PORCINE) 25000 UT/250ML-% IV SOLN
800.0000 [IU]/h | INTRAVENOUS | Status: AC
  Administered 2021-05-12: 09:00:00 800 [IU]/h via INTRAVENOUS

## 2021-05-12 MED ORDER — MIDAZOLAM HCL 2 MG/2ML IJ SOLN
INTRAMUSCULAR | Status: DC | PRN
  Administered 2021-05-12 (×3): 1 mg via INTRAVENOUS

## 2021-05-12 MED ORDER — HEPARIN (PORCINE) 25000 UT/250ML-% IV SOLN: 1100.0000 [IU]/h | INTRAVENOUS | Status: DC

## 2021-05-12 MED ORDER — ONDANSETRON HCL 4 MG/2ML IJ SOLN: 4.0000 mg | Freq: Four times a day (QID) | INTRAMUSCULAR | Status: DC | PRN

## 2021-05-12 MED ORDER — METHYLPREDNISOLONE SODIUM SUCC 125 MG IJ SOLR: 125.0000 mg | Freq: Once | INTRAMUSCULAR | Status: DC | PRN

## 2021-05-12 MED ORDER — MIDAZOLAM HCL 2 MG/2ML IJ SOLN
INTRAMUSCULAR | Status: AC
  Filled 2021-05-12: qty 2

## 2021-05-12 SURGICAL SUPPLY — 8 items
CATH ANGIO 5F PIGTAIL 65CM (CATHETERS) IMPLANT
COVER PROBE U/S 5X48 (MISCELLANEOUS) ×1 IMPLANT
GLIDEWIRE ADV .035X180CM (WIRE) ×1 IMPLANT
PACK ANGIOGRAPHY (CUSTOM PROCEDURE TRAY) ×2 IMPLANT
SHEATH BRITE TIP 5FRX11 (SHEATH) ×1 IMPLANT
SYR MEDRAD MARK 7 150ML (SYRINGE) ×1 IMPLANT
TUBING CONTRAST HIGH PRESS 72 (TUBING) ×1 IMPLANT
WIRE GUIDERIGHT .035X150 (WIRE) ×1 IMPLANT

## 2021-05-12 NOTE — Progress Notes (Signed)
PROGRESS NOTE   Clifford [name]  NKN:[id number]    DOB: 08-05-1950    DOA: 05/10/2021  PCP: Pcp, No   I have briefly reviewed patients previous medical records in [facility].  Chief Complaint  Patient presents with   femoral occlusion    Hospital Course:  71 year old male, SNF resident, medical history significant for HTN, pulmonary embolism on Eliquis, HLD, PAD, prediabetes, CVA with residual left hemiplegia, PEG tube dependent, nonverbal, sent from SNF due to abnormal USG that showed abnormal circulation.  In ED, dopplerable pulses in both feet.  Podiatry/Dr. Amalia Hailey consulted for suspected osteomyelitis of right first toe.  Vascular surgery consulted.  Unsuccessful attempt to do lower extremity angiogram.  Await podiatry follow-up regarding surgical decision.   Assessment & Plan:  Principal Problem:   Acute osteomyelitis of toe, right (HCC) Active Problems:   PAD (peripheral artery disease) (HCC)   AMS (altered mental status)   Stage 3a chronic kidney disease (CKD) (HCC)   Essential hypertension   Chronic diastolic congestive heart failure (HCC)   History of pulmonary embolism   Chronic anticoagulation   Hypernatremia   Lactic acidosis   Thrombocytopenia (HCC)   Elevated troponin level   Normocytic anemia   Failure to thrive in adult   Malnutrition of moderate degree   Assessment and Plan: * Acute osteomyelitis of toe, right (HCC) Right hallux gangrene Podiatry/Dr. Amalia Hailey consultation appreciated Suspect gangrene and osteomyelitis of right hallux For now continue IV vancomycin and cefepime.  Consider ID consultation if not improving. Unable to complete MRI of lower extremity due to left sided contractures. As per podiatry recommendations, vascular surgery consulted to determine PAD status for any potential surgical intervention Suspects he will need at least a partial or total hallux amputation. As per vascular surgery, due to extreme left lower extremity contractures,  unable to perform angiogram and recommend proceeding with surgery as needed.  Await podiatry follow-up.  PAD (peripheral artery disease) ([place]) As per triage notes, presented to the ED via EMS [facility] for right femoral artery occlusion Imaging was done 3/4 but did not receive results until day of ED visit. Vascular surgery/Dr. Lucky Cowboy consultation appreciated.  Reportedly noninvasive imaging previously done was of left lower extremity which showed femoral occlusion but current gangrenous changes were of right first toe. As noted above, unable to perform angiogram due to severe left lower extremity contractures.  AMS (altered mental status) As per discussion with patient's brother on 3/8, up to 2 to 3 days PTA, patient reportedly was conversing with him and appropriately so.  The AMS seems to be subacute or acute. Etiology unclear.?  Infectious.?  Suspect acute CVA.  CT head shows chronic right MCA infarct.  Hypodensity in the right thalamus and splenium of the corpus callosum which was not identified on prior MRI 10/28/2020 and could be a new area of infarct.  No hemorrhage.  Patient unable to tolerate MRI due to left-sided contractures. Pt is on eliquis which was held for procedures and on IV heparin. Overall poor prognosis due to advanced age, debilitating CVA and multiple comorbidities and current acute illness.  Stage 3a chronic kidney disease (CKD) ([place]) Unclear if he actually has stage II CKD or stage IIIa. Creatinine normal.  Essential hypertension Controlled.  Chronic diastolic congestive heart failure (HCC) Compensated.  Chronic anticoagulation Eliquis on hold.  History of pulmonary embolism Eliquis held for possible procedures.  Continue IV heparin  Malnutrition of moderate degree Nutrition Status: Nutrition Problem: Moderate Malnutrition  Etiology: chronic illness (CVA) Signs/Symptoms: mild fat depletion, moderate fat depletion, mild muscle depletion, moderate  muscle depletion Interventions: MVI, Prostat, Tube feeding   Unsafe for any PO intake at this time, so only tube feeds and meds.  Failure to thrive in adult Extremely poor prognosis. Palliative care on board with ongoing [place] discussions with patient's daughter.  Normocytic anemia No overt bleeding. Follow CBC in AM.  Transfuse if hemoglobin 7 g or less. Hemoglobin stable in the 8.9-8.8 range.  Elevated troponin level Creatinine elevated in the 120-130 range, flat trend.  No history of chest pain available.   ekg shows sr and mild twi in lateral leads.  Suspect demand ischemia.we will obtain echo.  With his h/o anemia we will cont eliquis wit no addition of additional Antiplatelet and anticoagulation.  Pt is also thrombocytopenic.  Last echo 8/22: LVEF 65-70%.  Thrombocytopenia ([name]) Appears relatively stable.  Follow daily CBC.  Lactic acidosis Suspected due to hypotension and poor tissue perfusion.  Serum lactate has fluctuated, peaked at 2.3 and down to 2.  Hypernatremia Serum sodium has actually increased from 146-1 50. Likely related to poor oral intake/inadequate free water through PEG tube Continue free water via PEG tube and initiated IV D5 infusion. Resolved.  Continue to follow BMP  Body mass index is 26.14 kg/m.  Nutritional Status Nutrition Problem: Moderate Malnutrition Etiology: chronic illness (CVA) Signs/Symptoms: mild fat depletion, moderate fat depletion, mild muscle depletion, moderate muscle depletion Interventions: MVI, Prostat, Tube feeding  Pressure Ulcer:     DVT prophylaxis: heparin bolus via infusion 2,100 Units Start: 05/12/21 2030  SCDs.  Eliquis on hold.   Code Status: DNR:  Family Communication: None at bedside. Disposition:  Status is: Inpatient Remains inpatient appropriate because: Severity of illness, possible need for interventions    Consultants:   Podiatry Vascular surgery  Procedures:     Antimicrobials:   As  above   Subjective:  Seen this morning prior to procedure.  Patient remains nonverbal and noncommunicative.  No acute issues per nursing  Objective:   Vitals:   05/12/21 1345 05/12/21 1400 05/12/21 1415 05/12/21 1607  BP: (!) 171/93 (!) 148/81 135/78 (!) 154/81  Pulse: 82   81  Resp: '16 17 16 19  '$ Temp:    98.2 F (36.8 C)  TempSrc:      SpO2: 100% 100% 100% 100%  Weight:      Height:        General exam: Elderly male, moderately built and poorly nourished lying propped up in bed comfortably. Respiratory system: Clear to auscultation but poor inspiratory effort. Respiratory effort normal. Cardiovascular system: S1 & S2 heard, RRR. No JVD, murmurs, rubs, gallops or clicks. No pedal edema.  Telemetry personally reviewed: Had brief episode of SVT with heart rates in the 130s last night but has reverted to sinus rhythm. Gastrointestinal system: Abdomen is nondistended, soft and nontender. No organomegaly or masses felt. Normal bowel sounds heard. Central nervous system: Lethargic, intermittently opens eyes but does not track.  Nonverbal.  No significant change in mental status since yesterday.  Bilateral immature cataracts and arcus analysis.  No gaze palsy.  No nystagmus.  No facial asymmetry.  Nonverbal and does not follow instructions. Extremities: Occasional purposeful movements of right hand.  Left sided hemiparesis with contractures.  Left elbow/forearm flexed contracted across abdomen.  Left lower extremity contracted at the knee. Skin: As per RN, has sacral ulcer, bilateral heel ulcers.  Exam as per [facility] RN. Psychiatry: Judgement and insight  impaired. Mood & affect cannot be assessed.    Data Reviewed:   I have personally reviewed following labs and imaging studies   CBC: Recent Labs  Lab 05/10/21 1521 05/11/21 0507 05/12/21 0510  WBC 7.4 8.5 6.0  NEUTROABS 5.1  --   --   HGB 10.2* 8.9* 8.8*  HCT 34.7* 30.2* 29.0*  MCV 86.8 86.0 85.5  PLT 142* 128* 139*    Basic  Metabolic Panel: Recent Labs  Lab 05/10/21 1839 05/11/21 0507 05/11/21 1618 05/12/21 0510 05/12/21 1452 05/12/21 1646  NA 146* 150*  --  143  --   --   K 4.4 4.0  --  3.2*  --   --   CL 115* 122*  --  113*  --   --   CO2 21* 24  --  20*  --   --   GLUCOSE 106* 104*  --  108*  --   --   BUN 43* 39*  --  36* 34*  --   CREATININE 1.12 1.18  --  0.94 1.01  --   CALCIUM 9.1 9.0  --  8.7*  --   --   MG 2.0  --  1.9 1.9  --  1.9  PHOS  --   --  3.5 3.4  --  2.9    Liver Function Tests: Recent Labs  Lab 05/10/21 1839  AST 36  ALT 28  ALKPHOS 60  BILITOT 0.8  PROT 7.0  ALBUMIN 2.9*    CBG: Recent Labs  Lab 05/12/21 0458 05/12/21 0749 05/12/21 1604  GLUCAP 107* 107* 98    Microbiology Studies:   Recent Results (from the past 240 hour(s))  Resp Panel by RT-PCR (Flu A&B, Covid) Nasopharyngeal Swab     Status: None   Collection Time: 05/10/21  3:21 PM   Specimen: Nasopharyngeal Swab; Nasopharyngeal(NP) swabs in vial transport medium  Result Value Ref Range Status   SARS Coronavirus 2 by RT PCR NEGATIVE NEGATIVE Final    Comment: (NOTE) SARS-CoV-2 target nucleic acids are NOT DETECTED.  The SARS-CoV-2 RNA is generally detectable in upper respiratory specimens during the acute phase of infection. The lowest concentration of SARS-CoV-2 viral copies this assay can detect is 138 copies/mL. A negative result does not preclude SARS-Cov-2 infection and should not be used as the sole basis for treatment or other patient management decisions. A negative result may occur with  improper specimen collection/handling, submission of specimen other than nasopharyngeal swab, presence of viral mutation(s) within the areas targeted by this assay, and inadequate number of viral copies(<138 copies/mL). A negative result must be combined with clinical observations, patient history, and epidemiological information. The expected result is Negative.  Fact Sheet for Patients:   EntrepreneurPulse.com.au  Fact Sheet for Healthcare Providers:  IncredibleEmployment.be  This test is no t yet approved or cleared by the [place] FDA and  has been authorized for detection and/or diagnosis of SARS-CoV-2 by FDA under an Emergency Use Authorization (EUA). This EUA will remain  in effect (meaning this test can be used) for the duration of the COVID-19 declaration under Section 564(b)(1) of the Act, 21 U.S.C.section 360bbb-3(b)(1), unless the authorization is terminated  or revoked sooner.       Influenza A by PCR NEGATIVE NEGATIVE Final   Influenza B by PCR NEGATIVE NEGATIVE Final    Comment: (NOTE) The Xpert Xpress SARS-CoV-2/FLU/RSV plus assay is intended as an aid in the diagnosis of influenza from Nasopharyngeal swab specimens and should  not be used as a sole basis for treatment. Nasal washings and aspirates are unacceptable for Xpert Xpress SARS-CoV-2/FLU/RSV testing.  Fact Sheet for Patients: EntrepreneurPulse.com.au  Fact Sheet for Healthcare Providers: IncredibleEmployment.be  This test is not yet approved or cleared by the [place] FDA and has been authorized for detection and/or diagnosis of SARS-CoV-2 by FDA under an Emergency Use Authorization (EUA). This EUA will remain in effect (meaning this test can be used) for the duration of the COVID-19 declaration under Section 564(b)(1) of the Act, 21 U.S.C. section 360bbb-3(b)(1), unless the authorization is terminated or revoked.  Performed at [facility], [address] [place], [place] [address]   Blood culture (routine x 2)     Status: None (Preliminary result)   Collection Time: 05/10/21  6:39 PM   Specimen: BLOOD  Result Value Ref Range Status   Specimen Description BLOOD LEFT HAND  Final   Special Requests   Final    BOTTLES DRAWN AEROBIC ONLY Blood Culture adequate volume   Culture   Final    NO  GROWTH 2 DAYS Performed at [facility], [address] [place], [place] [address]    Report Status PENDING  Incomplete  Culture, blood (Routine X 2) w Reflex to ID Panel     Status: None (Preliminary result)   Collection Time: 05/11/21  1:59 AM   Specimen: BLOOD  Result Value Ref Range Status   Specimen Description BLOOD RIGHT ASSIST CONTROL  Final   Special Requests   Final    BOTTLES DRAWN AEROBIC AND ANAEROBIC Blood Culture adequate volume   Culture   Final    NO GROWTH 1 DAY Performed at [facility], [address], [place], [place] [address]    Report Status PENDING  Incomplete    Radiology Studies:  CT ANGIO HEAD NECK W WO CM  Result Date: 05/11/2021 CLINICAL DATA:  Initial evaluation for neuro deficit, stroke suspected. EXAM: CT ANGIOGRAPHY HEAD AND NECK TECHNIQUE: Multidetector CT imaging of the head and neck was performed using the standard protocol during bolus administration of intravenous contrast. Multiplanar CT image reconstructions and MIPs were obtained to evaluate the vascular anatomy. Carotid stenosis measurements (when applicable) are obtained utilizing NASCET criteria, using the distal internal carotid diameter as the denominator. RADIATION DOSE REDUCTION: This exam was performed according to the departmental dose-optimization program which includes automated exposure control, adjustment of the mA and/or kV according to patient size and/or use of iterative reconstruction technique. CONTRAST:  41m OMNIPAQUE IOHEXOL 350 MG/ML SOLN COMPARISON:  Comparison made with prior head CT from earlier the same day. FINDINGS: CTA NECK FINDINGS Aortic arch: Visualized aortic arch normal caliber with normal branch pattern. Mild atheromatous change about the arch itself. No hemodynamically significant stenosis about the origin of the great vessels. Right carotid system: Right CCA tortuous but widely patent to the bifurcation. There is abrupt occlusion of the proximal  cervical right ICA just distal to the bifurcation, likely chronic in nature given the large chronic right cerebral infarct seen on prior CT. Right ICA remains occluded to the skull base. Left carotid system: Left common and internal carotid arteries are somewhat tortuous but widely patent without stenosis or dissection. Vertebral arteries: Both vertebral arteries arise from the subclavian arteries. No proximal subclavian artery stenosis. Proximal left vertebral artery not well seen due to adjacent venous contamination. Visualized portions of the vertebral arteries patent without stenosis or dissection. Skeleton: Visualized osseous structures are somewhat heterogeneous without definite discrete lytic  or blastic osseous lesion. Mild-to-moderate multilevel cervical spondylosis without high-grade spinal stenosis. Patient is edentulous. Other neck: No other acute soft tissue abnormality within the neck. 6 mm right thyroid nodule noted, of doubtful significance given size and patient age, no follow-up imaging recommended (ref: J Am Coll Radiol. 2015 Feb;12(2): 143-50). Upper chest: Visualized upper chest demonstrates no acute finding. Review of the MIP images confirms the above findings CTA HEAD FINDINGS Anterior circulation: Petrous left ICA widely patent. Atheromatous change within the left carotid siphon without hemodynamically significant stenosis. Right ICA remains occluded to the terminus. A1 segments patent bilaterally. Right A1 hypoplastic. Normal anterior communicating artery complex. Both anterior cerebral arteries widely patent to their distal aspects. Left M1 widely patent. Normal left MCA bifurcation. Distal left MCA branches widely patent. Perfusion of the right M1 segment via collateral flow across the circle-of-Willis. Right M1 is somewhat diminutive with probable short-segment moderate stenosis at its distal aspect (series 9, image 53). Normal right MCA bifurcation. Right MCA branches attenuated but  grossly patent distally. Posterior circulation: Both vertebral arteries patent without hemodynamically significant stenosis. Both PICA patent. Basilar patent to its distal aspect without stenosis. Superior cerebellar and posterior cerebral arteries widely patent bilaterally. Venous sinuses: Patent allowing for timing the contrast bolus. Anatomic variants: None significant.  No aneurysm. Review of the MIP images confirms the above findings IMPRESSION: 1. 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 Right ICA remains occluded to the terminus. Right MCA and its branches are attenuated but perfused via collateral flow across the circle-of-Willis. 2. Mild atheromatous change about the left carotid bifurcation and left carotid siphon, but no hemodynamically significant or correctable stenosis. 3. Diffuse tortuosity of the major arterial vasculature of the head and neck, suggesting chronic underlying hypertension. Electronically Signed   By: Jeannine Boga M.D.   On: 05/11/2021 00:11   CT HEAD WO CONTRAST (5MM)  Result Date: 05/11/2021 CLINICAL DATA:  Stroke follow-up EXAM: CT HEAD WITHOUT CONTRAST TECHNIQUE: Contiguous axial images were obtained from the base of the skull through the vertex without intravenous contrast. RADIATION DOSE REDUCTION: This exam was performed according to the departmental dose-optimization program which includes automated exposure control, adjustment of the mA and/or kV according to patient size and/or use of iterative reconstruction technique. COMPARISON:  CT head 05/10/2021 FINDINGS: Brain: Chronic right MCA infarct involving the ref right frontal parietal and temporal lobe. Largest area is in the right parietal lobe. Dystrophic calcification in the infarct. Hypodensity in the right thalamus and splenium unchanged. No change from yesterday. Negative for acute infarct, hemorrhage, mass. Mild enlargement of the right atrium due to volume loss from prior  infarction. Vascular: Negative for hyperdense vessel Skull: Negative Sinuses/Orbits: Mild mucosal edema paranasal sinuses. Negative orbit Other: None IMPRESSION: Chronic right MCA infarct. Hypodensity right thalamus and splenium unchanged possibly areas of chronic infarct as well. No change from yesterday. Electronically Signed   By: Franchot Gallo M.D.   On: 05/11/2021 16:02   PERIPHERAL VASCULAR CATHETERIZATION  Result Date: 05/12/2021 See surgical note for result.   Scheduled Meds:    atorvastatin  40 mg Per Tube QHS   famotidine  20 mg Per Tube BID   feeding supplement (PROSource TF)  45 mL Per Tube Daily   fentaNYL       fentaNYL       free water  155 mL Per Tube Q4H   heparin  2,100 Units Intravenous Once   levETIRAcetam  250 mg Per Tube BID  midazolam       midazolam       pantoprazole sodium  40 mg Per Tube BID   topiramate  50 mg Per Tube BID    Continuous Infusions:    ceFAZolin     ceFEPime (MAXIPIME) IV 2 g (05/12/21 0542)   feeding supplement (OSMOLITE 1.5 CAL) 1,000 mL (05/12/21 1635)   heparin     vancomycin 1,500 mg (05/12/21 1539)     LOS: 2 days     Vernell Leep, MD,  FACP, [facility], [facility], [facility] (Care Management Physician Certified) [place]  To contact the attending provider between 7A-7P or the covering provider during after hours 7P-7A, please log into the web site [email or web address] and access using universal [place] password for that web site. If you do not have the password, please call the hospital operator.  05/12/2021, 5:29 PM

## 2021-05-12 NOTE — Consult Note (Addendum)
ANTICOAGULATION CONSULT NOTE  ? ?Pharmacy Consult for heparin ?Indication: Hx of PE ? ?Allergies  ?Allergen Reactions  ? Apixaban   ?  Other reaction(s): Liver enzymes abnormal  ? Lisinopril   ?  Other reaction(s): Cough (finding)  ? Simvastatin   ?  Other reaction(s): Pain in lower limb (finding)  ? ? ?Patient Measurements: ?Height: '5\' 7"'$  (170.2 cm) ?Weight: 75.7 kg (166 lb 14.2 oz) ?IBW/kg (Calculated) : 66.1 ?Heparin Dosing Weight: 84.4kg ? ?Vital Signs: ?Temp: 98.2 ?F (36.8 ?C) (03/09 0747) ?Temp Source: Oral (03/09 0453) ?BP: 118/83 (03/09 0747) ?Pulse Rate: 74 (03/09 0747) ? ?Labs: ?Recent Labs  ?  05/10/21 ?1521 05/10/21 ?1839 05/11/21 ?0159 05/11/21 ?0507 05/11/21 ?1618 05/12/21 ?1572 05/12/21 ?0510  ?HGB 10.2*  --   --  8.9*  --   --  8.8*  ?HCT 34.7*  --   --  30.2*  --   --  29.0*  ?PLT 142*  --   --  128*  --   --  139*  ?APTT 55*  --   --   --   --  >200* >200*  ?LABPROT 19.5*  --   --   --   --   --   --   ?INR 1.7*  --   --   --   --   --   --   ?HEPARINUNFRC  --   --   --   --  >1.10*  --  >1.10*  ?CREATININE  --  1.12  --  1.18  --   --  0.94  ?TROPONINIHS  --  134* 122* 121*  --   --   --   ? ? ? ?Estimated Creatinine Clearance: 66.4 mL/min (by C-G formula based on SCr of 0.94 mg/dL). ? ? ?Medical History: ?Past Medical History:  ?Diagnosis Date  ? BPH (benign prostatic hyperplasia)   ? Chronic anticoagulation 10/14/2020  ? Essential hypertension 10/06/2020  ? History of pulmonary embolism 10/14/2020  ? HLD (hyperlipidemia)   ? HTN (hypertension)   ? Lung nodule   ? 68m in 2015  ? Malignant neoplasm of left kidney (HCC)   ? Mixed hyperlipidemia 10/06/2020  ? Nicotine dependence, cigarettes, uncomplicated 86/20/3559 ? Peripheral vascular disease ([place]   ? Polyp of colon   ? Prediabetes 10/14/2020  ? [place]monary embolus ([place]   ? Stroke ([facility] 10/06/2020  ? ? ?Medications:  ?PTA: Eliquis '5mg'$  BID ?Inpatient: Eliquis > 3/8 > Heparin Drip (3/8 >>>) ?Allergies: reported abnormal liver enzymes on  eliquis ? ?Assessment: ?72year old male, SNF resident, medical history significant for HTN, pulmonary embolism on Eliquis, HLD, PAD, prediabetes, CVA with residual left hemiplegia, PEG tube dependent, nonverbal, sent from SNF due to abnormal USG that showed abnormal circulation.  In ED, dopplerable pulses in both feet.  Podiatry/Dr. EAmalia Haileyconsulted for suspected osteomyelitis of right first toe.  Vascular surgery consulted as well. Pharmacy consulted for management of heparin drip in the setting of history for PE. ? ?Goal of Therapy:  ?Heparin level 0.3-0.7 units/ml ?aPTT 66-102 seconds ?Monitor platelets by anticoagulation protocol: Yes ? ?Baseline Labs: ?aPTT - 55 ?Hgb - 8.9 ?Plts - 128 ?Heparin level - >1.10 ? ?Date Time aPTT/HL Rate/Comment ?3/07 1521   55/>1.10 ?3/08 1600   4200 bolus + 1300 u/hr ?3/09 0039 >200/  Pause x1 hr 1300 > 1100 u/hr ?3/09 0500 >200/>1.10 Pause x1 hr 1100 > 800 u/hr ?  ? ?Plan ?Hold infusion for 1 hr then decrease heparin drip to  800 units/hr and stop at 1000 for planned procedure per Vascular recommendations. ?Will check aPTT/Anti-Xa level 8 hours after heparin infusion is restarted and daily once consecutively therapeutic.  ?Titrate by aPTT's until lab correlation is noted, then titrate by anti-xa alone. ?Continue to monitor H&H and platelets daily while on heparin gtt. ? ?Darrick Penna, PharmD, MS PGPM ?Clinical Pharmacist ?05/12/2021 ?8:22 AM ? ? ? ? ? ?

## 2021-05-12 NOTE — Progress Notes (Signed)
Palliative:  ?Clifford [name] is lying quietly in bed.  He appears acutely/chronically ill and frail.  He will briefly make but not keep eye contact.  He will say hello to me if asked several times.  I do not believe that he can make his basic needs known.  There is no family at bedside at this time.  Nursing staff is at bedside attending to needs.  Detailed discussion with bedside nursing staff related to patient condition, needs, goals of care. ? ?Clifford [name] has been evaluated by vascular surgery and should be taken for revascularization today. ? ?Call to daughter, healthcare surrogate, Clifford [name].  She tells me that she has been in touch with her uncle and understands that Clifford [name] is going for revascularization today.  We talk about his tenuous vascular status, osteomyelitis of right great toe, need for antibiotics.  We talked about time for outcomes. ? ?We talked about returning to [facility] under long-term care, some rehab if needed/qualified.  We talked about the benefits of outpatient palliative services versus hospice care.  Clifford [name] states that she and her sister should be arriving in the next few days. ? ?Conference with attending, bedside nursing staff, transition of care team related to patient condition, needs, goals of care, disposition. ?Clifford [name] shares that she and her uncle work closely together. ? ?Plan: At this point continue to treat the treatable but no CPR or intubation.  Time for outcomes.  Return to [facility] under long-term care.  Considering outpatient palliative services with hospice care in the future. ? ?50 minutes  ?[name], NP ?Palliative Medicine Team  ?Team Phone [phone] ?Greater than 50% of this time was spent counseling and coordinating care related to the above assessment and plan.  ?

## 2021-05-12 NOTE — Op Note (Signed)
The patient's extreme left leg contracture precluded left femoral access and antegrade access is not appropriate without knowing inflow disease.  Attempts at access were made, but we cannot get a wire to go proximally.  Given his debilitated, bedbound state, I would do what ever needs to be done in terms of debridement or amputation on his foot and if that does not heal would consider above-knee amputation. ?

## 2021-05-12 NOTE — Assessment & Plan Note (Addendum)
Nutrition Status: ?Nutrition Problem: Moderate Malnutrition ?Etiology: chronic illness (CVA) ?Signs/Symptoms: mild fat depletion, moderate fat depletion, mild muscle depletion, moderate muscle depletion ?Interventions: MVI, Prostat, Tube feeding  ? ?Discontinued tube feeds. ?

## 2021-05-12 NOTE — Consult Note (Signed)
ANTICOAGULATION CONSULT NOTE  ? ?Pharmacy Consult for heparin ?Indication: Hx of PE ? ?Allergies  ?Allergen Reactions  ? Apixaban   ?  Other reaction(s): Liver enzymes abnormal  ? Lisinopril   ?  Other reaction(s): Cough (finding)  ? Simvastatin   ?  Other reaction(s): Pain in lower limb (finding)  ? ? ?Patient Measurements: ?Height: '5\' 7"'$  (170.2 cm) ?Weight: 88.5 kg (195 lb) ?IBW/kg (Calculated) : 66.1 ?Heparin Dosing Weight: 84.4kg ? ?Vital Signs: ?Temp: 98.4 ?F (36.9 ?C) (03/09 0013) ?Temp Source: Oral (03/09 0013) ?BP: 121/67 (03/09 0013) ?Pulse Rate: 63 (03/09 0013) ? ?Labs: ?Recent Labs  ?  05/10/21 ?1521 05/10/21 ?1839 05/11/21 ?0159 05/11/21 ?0507 05/11/21 ?1618 05/12/21 ?0539  ?HGB 10.2*  --   --  8.9*  --   --   ?HCT 34.7*  --   --  30.2*  --   --   ?PLT 142*  --   --  128*  --   --   ?APTT 55*  --   --   --   --  >200*  ?LABPROT 19.5*  --   --   --   --   --   ?INR 1.7*  --   --   --   --   --   ?HEPARINUNFRC  --   --   --   --  >1.10*  --   ?CREATININE  --  1.12  --  1.18  --   --   ?TROPONINIHS  --  134* 122* 121*  --   --   ? ? ? ?Estimated Creatinine Clearance: 60.1 mL/min (by C-G formula based on SCr of 1.18 mg/dL). ? ? ?Medical History: ?Past Medical History:  ?Diagnosis Date  ? BPH (benign prostatic hyperplasia)   ? Chronic anticoagulation 10/14/2020  ? Essential hypertension 10/06/2020  ? History of pulmonary embolism 10/14/2020  ? HLD (hyperlipidemia)   ? HTN (hypertension)   ? Lung nodule   ? 71m in 2015  ? Malignant neoplasm of left kidney (HCC)   ? Mixed hyperlipidemia 10/06/2020  ? Nicotine dependence, cigarettes, uncomplicated 87/67/3419 ? Peripheral vascular disease ([facility]   ? Polyp of colon   ? Prediabetes 10/14/2020  ? [place]   ? Stroke ([facility] 10/06/2020  ? ? ?Medications:  ?PTA: Eliquis '5mg'$  BID ?Inpatient: Eliquis > 3/8 > Heparin Drip (3/8 >>>) ?Allergies: reported abnormal liver enzymes on eliquis ? ? ?Date Time aPTT/HL Rate/Comment ?     ? ?Baseline Labs: ?aPTT - 55 ?Hgb -  8.9 ?Plts - 128 ?Heparin level - >1.1 ? ? ?Assessment: ?71year old male, SNF resident, medical history significant for HTN, pulmonary embolism on Eliquis, HLD, PAD, prediabetes, CVA with residual left hemiplegia, PEG tube dependent, nonverbal, sent from SNF due to abnormal USG that showed abnormal circulation.  In ED, dopplerable pulses in both feet.  Podiatry/Dr. EAmalia Haileyconsulted for suspected osteomyelitis of right first toe.  Vascular surgery consulted as well. Pharmacy consulted for management of heparin drip in the setting of history for PE. ? ?Goal of Therapy:  ?Heparin level 0.3-0.7 units/ml ?aPTT 66-102 seconds ?Monitor platelets by anticoagulation protocol: Yes ? ?3/09 0039 aPTT >200 ?  ?Plan:  ?--Contacted RN.  Per RN, lab tech drew lab from same arm due to pt being "hard stick" but drew it from "below" heparin infusion line. ?--Hold infusion for 1 hr. ?--Restart heparin infusion at 1100 units/hr,  ?--Since pt has 2 IV lines, RN to restart heparin in opposite arm. ?--Reordered  aPTT with AM labs to confirm if aPTT was supratherapeutic or lab error, then recheck in 8 hrs.  ?--Check aPTT/Anti-Xa level in 8 hours and daily once consecutively therapeutic.  ?--Titrate by aPTT's until lab correlation is noted, then titrate by anti-xa alone. ?--Continue to monitor H&H and platelets daily while on heparin gtt. ? ?Renda Rolls, PharmD, MBA ?05/12/2021 ?2:47 AM ? ? ? ? ?

## 2021-05-12 NOTE — Interval H&P Note (Signed)
History and Physical Interval Note: ? ?05/12/2021 ?12:11 PM ? ?Clifford [name]  has presented today for surgery, with the diagnosis of Atherosclerosis obliterans with ulceration.  The various methods of treatment have been discussed with the patient and family. After consideration of risks, benefits and other options for treatment, the patient has consented to  Procedure(s): ?Lower Extremity Angiography (Right) as a surgical intervention.  The patient's history has been reviewed, patient examined, no change in status, stable for surgery.  I have reviewed the patient's chart and labs.  Questions were answered to the patient's satisfaction.   ? ? ?[name] ? ? ?

## 2021-05-12 NOTE — Consult Note (Signed)
ANTICOAGULATION CONSULT NOTE  ? ?Pharmacy Consult for heparin ?Indication: Hx of PE ? ?Allergies  ?Allergen Reactions  ? Apixaban   ?  Other reaction(s): Liver enzymes abnormal  ? Lisinopril   ?  Other reaction(s): Cough (finding)  ? Simvastatin   ?  Other reaction(s): Pain in lower limb (finding)  ? ? ?Patient Measurements: ?Height: '5\' 7"'$  (170.2 cm) ?Weight: 75.7 kg (166 lb 14.2 oz) ?IBW/kg (Calculated) : 66.1 ?Heparin Dosing Weight: 84.4kg ? ?Vital Signs: ?Temp: 97.7 ?F (36.5 ?C) (03/09 1124) ?Temp Source: Oral (03/09 1124) ?BP: 135/78 (03/09 1415) ?Pulse Rate: 82 (03/09 1345) ? ?Labs: ?Recent Labs  ?  05/10/21 ?1521 05/10/21 ?1521 05/10/21 ?1839 05/11/21 ?0159 05/11/21 ?0507 05/11/21 ?1618 05/12/21 ?0962 05/12/21 ?0510 05/12/21 ?1452  ?HGB 10.2*  --   --   --  8.9*  --   --  8.8*  --   ?HCT 34.7*  --   --   --  30.2*  --   --  29.0*  --   ?PLT 142*  --   --   --  128*  --   --  139*  --   ?APTT 55*  --   --   --   --   --  >200* >200*  --   ?LABPROT 19.5*  --   --   --   --   --   --   --   --   ?INR 1.7*  --   --   --   --   --   --   --   --   ?HEPARINUNFRC  --   --   --   --   --  >1.10*  --  >1.10*  --   ?CREATININE  --    < > 1.12  --  1.18  --   --  0.94 1.01  ?TROPONINIHS  --   --  134* 122* 121*  --   --   --   --   ? < > = values in this interval not displayed.  ? ? ? ?Estimated Creatinine Clearance: 61.8 mL/min (by C-G formula based on SCr of 1.01 mg/dL). ? ? ?Medical History: ?Past Medical History:  ?Diagnosis Date  ? BPH (benign prostatic hyperplasia)   ? Chronic anticoagulation 10/14/2020  ? Essential hypertension 10/06/2020  ? History of pulmonary embolism 10/14/2020  ? HLD (hyperlipidemia)   ? HTN (hypertension)   ? Lung nodule   ? 82m in 2015  ? Malignant neoplasm of left kidney (HCC)   ? Mixed hyperlipidemia 10/06/2020  ? Nicotine dependence, cigarettes, uncomplicated 88/36/6294 ? [place]ipheral vascular disease ([place]   ? Polyp of colon   ? Prediabetes 10/14/2020  ? [place]monary embolus ([facility]   ? Stroke ([facility]  10/06/2020  ? ? ?Medications:  ?PTA: Eliquis '5mg'$  BID ?Inpatient: Eliquis > 3/8 > Heparin Drip (3/8 >>>) ?Allergies: reported abnormal liver enzymes on eliquis ? ?Assessment: ?71year old male, SNF resident, medical history significant for HTN, pulmonary embolism on Eliquis, HLD, PAD, prediabetes, CVA with residual left hemiplegia, PEG tube dependent, nonverbal, sent from SNF due to abnormal USG that showed abnormal circulation.  In ED, dopplerable pulses in both feet.  Podiatry/Dr. EAmalia Haileyconsulted for suspected osteomyelitis of right first toe.  Vascular surgery consulted as well. Pharmacy consulted for management of heparin drip in the setting of history for PE. ? ?Goal of Therapy:  ?Heparin level 0.3-0.7 units/ml ?aPTT 66-102 seconds ?Monitor platelets by anticoagulation protocol: Yes ? ?Baseline  Labs: ?aPTT - 55 ?Hgb - 8.9 ?Plts - 128 ?Heparin level - >1.10 ? ?Date Time aPTT/HL Rate/Comment ?3/07 1521   55/>1.10 ?3/08 1600   4200 bolus + 1300 u/hr ?3/09 0039 >200/  Pause x1 hr 1300 > 1100 u/hr ?3/09 0500 >200/>1.10 Pause x1 hr 1100 > 800 u/hr ?  ? ?Plan ?Patient arrived from angiography procedure at 1430. Spoke with vascular, plan to restart heparin drip 6 hours post procedure. Restart heparin drip at 800 units/hr at 2030 and give low bolus of 2100 units, given recent supratherapeutic heparin trends.  ?Will check aPTT/Anti-Xa level 8 hours after heparin infusion is restarted and daily once consecutively therapeutic.  ?Titrate by aPTT's until lab correlation is noted, then titrate by anti-xa alone. ?Continue to monitor H&H and platelets daily while on heparin gtt. ? ?Darrick Penna, PharmD, MS PGPM ?Clinical Pharmacist ?05/12/2021 ?3:27 PM ? ? ? ? ? ?

## 2021-05-13 ENCOUNTER — Encounter: Payer: Self-pay | Admitting: Anesthesiology

## 2021-05-13 DIAGNOSIS — L8990 Pressure ulcer of unspecified site, unspecified stage: Secondary | ICD-10-CM | POA: Insufficient documentation

## 2021-05-13 DIAGNOSIS — I471 Supraventricular tachycardia: Secondary | ICD-10-CM

## 2021-05-13 DIAGNOSIS — M86671 Other chronic osteomyelitis, right ankle and foot: Secondary | ICD-10-CM

## 2021-05-13 LAB — GLUCOSE, CAPILLARY
Glucose-Capillary: 107 mg/dL — ABNORMAL HIGH (ref 70–99)
Glucose-Capillary: 108 mg/dL — ABNORMAL HIGH (ref 70–99)
Glucose-Capillary: 119 mg/dL — ABNORMAL HIGH (ref 70–99)
Glucose-Capillary: 125 mg/dL — ABNORMAL HIGH (ref 70–99)
Glucose-Capillary: 91 mg/dL (ref 70–99)
Glucose-Capillary: 96 mg/dL (ref 70–99)

## 2021-05-13 LAB — HEPARIN LEVEL (UNFRACTIONATED)
Heparin Unfractionated: 0.67 IU/mL (ref 0.30–0.70)
Heparin Unfractionated: 0.5 IU/mL (ref 0.30–0.70)
Heparin Unfractionated: 0.35 IU/mL (ref 0.30–0.70)

## 2021-05-13 LAB — PHOSPHORUS: Phosphorus: 2.5 mg/dL (ref 2.5–4.6)

## 2021-05-13 LAB — BASIC METABOLIC PANEL
Anion gap: 6 (ref 5–15)
BUN: 29 mg/dL — ABNORMAL HIGH (ref 8–23)
CO2: 20 mmol/L — ABNORMAL LOW (ref 22–32)
Calcium: 8.5 mg/dL — ABNORMAL LOW (ref 8.9–10.3)
Chloride: 117 mmol/L — ABNORMAL HIGH (ref 98–111)
Creatinine, Ser: 0.94 mg/dL (ref 0.61–1.24)
GFR, Estimated: >60 mL/min (ref >60)
Glucose, Bld: 128 mg/dL — ABNORMAL HIGH (ref 70–99)
Potassium: 3.5 mmol/L (ref 3.5–5.1)
Sodium: 143 mmol/L (ref 135–145)

## 2021-05-13 LAB — CBC
HCT: 31.8 % — ABNORMAL LOW (ref 39.0–52.0)
Hemoglobin: 9.6 g/dL — ABNORMAL LOW (ref 13.0–17.0)
MCH: 25.5 pg — ABNORMAL LOW (ref 26.0–34.0)
MCHC: 30.2 g/dL (ref 30.0–36.0)
MCV: 84.4 fL (ref 80.0–100.0)
Platelets: 157 10*3/uL (ref 150–400)
RBC: 3.77 MIL/uL — ABNORMAL LOW (ref 4.22–5.81)
RDW: 14.9 % (ref 11.5–15.5)
WBC: 5.5 10*3/uL (ref 4.0–10.5)
nRBC: 0 % (ref 0.0–0.2)

## 2021-05-13 LAB — APTT: aPTT: 119 seconds — ABNORMAL HIGH (ref 24–36)

## 2021-05-13 LAB — MAGNESIUM: Magnesium: 2 mg/dL (ref 1.7–2.4)

## 2021-05-13 MED ORDER — METOPROLOL TARTRATE 5 MG/5ML IV SOLN
2.5000 mg | Freq: Three times a day (TID) | INTRAVENOUS | Status: DC | PRN
  Administered 2021-05-13: 2.5 mg via INTRAVENOUS
  Filled 2021-05-13: qty 5

## 2021-05-13 MED ORDER — POTASSIUM CHLORIDE 20 MEQ PO PACK
40.0000 meq | PACK | Freq: Once | ORAL | Status: AC
  Administered 2021-05-13: 40 meq
  Filled 2021-05-13: qty 2

## 2021-05-13 NOTE — Assessment & Plan Note (Addendum)
Noted telemetry with accelerated junctional rhythm/junctional tachycardia at 120 bpm that was sustained for several hours yesterday evening ?Patient asymptomatic and does not appear in pain either. ?Back in sinus rhythm.  Discontinue telemetry. ?

## 2021-05-13 NOTE — Progress Notes (Signed)
?   05/13/21 1702  ?Assess: MEWS Score  ?Temp 97.8 ?F (36.6 ?C)  ?BP 132/90  ?Pulse Rate (!) 126  ?Resp 20  ?SpO2 100 %  ?O2 Device Room Air  ?Assess: MEWS Score  ?MEWS Temp 0  ?MEWS Systolic 0  ?MEWS Pulse 2  ?MEWS RR 0  ?MEWS LOC 0  ?MEWS Score 2  ?MEWS Score Color Yellow  ?Assess: if the MEWS score is Yellow or Red  ?Were vital signs taken at a resting state? Yes  ?Focused Assessment No change from prior assessment  ?Treat  ?Pain Scale PAINAD  ?Faces Pain Scale 0  ?Breathing 0  ?Negative Vocalization 0  ?Facial Expression 0  ?Body Language 0  ?Consolability 0  ?PAINAD Score 0  ?Take Vital Signs  ?Increase Vital Sign Frequency  Yellow: Q 2hr X 2 then Q 4hr X 2, if remains yellow, continue Q 4hrs  ?Escalate  ?MEWS: Escalate Yellow: discuss with charge nurse/RN and consider discussing with provider and RRT  ?Notify: Charge Nurse/RN  ?Name of Charge Nurse/RN Notified Ashlley Rameriz  ?Date Charge Nurse/RN Notified 05/13/21  ?Time Charge Nurse/RN Notified 1710  ?Notify: Provider  ?Provider Name/Title Dr Algis Liming  ?Date Provider Notified 05/13/21  ?Time Provider Notified 1711  ?Notification Type  ?(text)  ?Provider response See new orders  ?Document  ?Progress note created (see row info) Yes  ?Assess: SIRS CRITERIA  ?SIRS Temperature  0  ?SIRS Pulse 1  ?SIRS Respirations  0  ?SIRS WBC 0  ?SIRS Score Sum  1  ? ? ?

## 2021-05-13 NOTE — Progress Notes (Signed)
Paged MD on call regarding patient's increased heart rate. All other vital signs stable and no other changes from prior assessment. Instructions to continue to monitor patient and let provider know if there are any other changes or if patient's heart rate gets to 130 and sustains.  ? 05/13/21 0013  ?Assess: MEWS Score  ?Temp 98.7 ?F (37.1 ?C)  ?BP 111/81  ?Pulse Rate (!) 122  ?Resp 20  ?SpO2 100 %  ?O2 Device Room Air  ?Assess: MEWS Score  ?MEWS Temp 0  ?MEWS Systolic 0  ?MEWS Pulse 2  ?MEWS RR 0  ?MEWS LOC 0  ?MEWS Score 2  ?MEWS Score Color Yellow  ?Assess: if the MEWS score is Yellow or Red  ?Were vital signs taken at a resting state? Yes  ?Focused Assessment No change from prior assessment  ?Does the patient meet 2 or more of the SIRS criteria? No  ?MEWS guidelines implemented *See Row Information* Yes  ?Treat  ?MEWS Interventions Other (Comment)  ?Pain Scale Faces  ?Faces Pain Scale 0  ?Breathing 0  ?Negative Vocalization 0  ?Facial Expression 0  ?Body Language 1  ?Consolability 0  ?PAINAD Score 1  ?Take Vital Signs  ?Increase Vital Sign Frequency  Yellow: Q 2hr X 2 then Q 4hr X 2, if remains yellow, continue Q 4hrs  ?Escalate  ?MEWS: Escalate Yellow: discuss with charge nurse/RN and consider discussing with provider and RRT  ?Notify: Charge Nurse/RN  ?Name of Charge Nurse/RN Notified Orvil Feil, RN  ?Date Charge Nurse/RN Notified 05/13/21  ?Time Charge Nurse/RN Notified 0015  ?Notify: Provider  ?Provider Name/Title Sharion Settler, NP  ?Date Provider Notified 05/13/21  ?Time Provider Notified [phone]  ?Notification Type Page  ?Notification Reason Other (Comment) ?(tachycardia)  ?Provider response No new orders  ?Date of Provider Response 05/13/21  ?Time of Provider Response 0022  ?Document  ?Patient Outcome Stabilized after interventions  ?Progress note created (see row info) Yes  ?Assess: SIRS CRITERIA  ?SIRS Temperature  0  ?SIRS Pulse 1  ?SIRS Respirations  0  ?SIRS WBC 0  ?SIRS Score Sum  1  ? ? ?

## 2021-05-13 NOTE — Consult Note (Signed)
ANTICOAGULATION CONSULT NOTE  ? ?Pharmacy Consult for heparin ?Indication: Hx of PE ? ?Allergies  ?Allergen Reactions  ? Apixaban   ?  Other reaction(s): Liver enzymes abnormal  ? Lisinopril   ?  Other reaction(s): Cough (finding)  ? Simvastatin   ?  Other reaction(s): Pain in lower limb (finding)  ? ? ?Patient Measurements: ?Height: '5\' 7"'$  (170.2 cm) ?Weight: 75.7 kg (166 lb 14.2 oz) ?IBW/kg (Calculated) : 66.1 ?Heparin Dosing Weight: 84.4kg ? ?Vital Signs: ?Temp: 98.3 ?F (36.8 ?C) (03/10 0453) ?Temp Source: Oral (03/10 0453) ?BP: 104/69 (03/10 0453) ?Pulse Rate: 123 (03/10 0453) ? ?Labs: ?Recent Labs  ?  05/10/21 ?1521 05/10/21 ?1521 05/10/21 ?1839 05/11/21 ?0159 05/11/21 ?0507 05/11/21 ?1618 05/12/21 ?9628 05/12/21 ?0510 05/12/21 ?1452 05/13/21 ?0522  ?HGB 10.2*  --   --   --  8.9*  --   --  8.8*  --  9.6*  ?HCT 34.7*  --   --   --  30.2*  --   --  29.0*  --  31.8*  ?PLT 142*  --   --   --  128*  --   --  139*  --  157  ?APTT 55*  --   --   --   --   --  >200* >200*  --  119*  ?LABPROT 19.5*  --   --   --   --   --   --   --   --   --   ?INR 1.7*  --   --   --   --   --   --   --   --   --   ?HEPARINUNFRC  --   --   --   --   --  >1.10*  --  >1.10*  --  0.67  ?CREATININE  --    < > 1.12  --  1.18  --   --  0.94 1.01 0.94  ?TROPONINIHS  --   --  134* 122* 121*  --   --   --   --   --   ? < > = values in this interval not displayed.  ? ? ? ?Estimated Creatinine Clearance: 66.4 mL/min (by C-G formula based on SCr of 0.94 mg/dL). ? ? ?Medical History: ?Past Medical History:  ?Diagnosis Date  ? BPH (benign prostatic hyperplasia)   ? Chronic anticoagulation 10/14/2020  ? Essential hypertension 10/06/2020  ? History of pulmonary embolism 10/14/2020  ? HLD (hyperlipidemia)   ? HTN (hypertension)   ? Lung nodule   ? 74m in 2015  ? Malignant neoplasm of left kidney (HCC)   ? Mixed hyperlipidemia 10/06/2020  ? Nicotine dependence, cigarettes, uncomplicated 83/66/2947 ? Peripheral vascular disease ([place]   ? Polyp of colon   ?  Prediabetes 10/14/2020  ? [facility] [place] ([facility]   ? Stroke ([facility] 10/06/2020  ? ? ?Medications:  ?PTA: Eliquis '5mg'$  BID ?Inpatient: Eliquis > 3/8 > Heparin Drip (3/8 >>>) ?Allergies: reported abnormal liver enzymes on eliquis ? ?Assessment: ?71year old male, SNF resident, medical history significant for HTN, pulmonary embolism on Eliquis, HLD, PAD, prediabetes, CVA with residual left hemiplegia, PEG tube dependent, nonverbal, sent from SNF due to abnormal USG that showed abnormal circulation.  In ED, dopplerable pulses in both feet.  Podiatry/Dr. EAmalia Haileyconsulted for suspected osteomyelitis of right first toe.  Vascular surgery consulted as well. Pharmacy consulted for management of heparin drip in the setting of history for PE. ? ?Goal of  Therapy:  ?Heparin level 0.3-0.7 units/ml ?aPTT 66-102 seconds ?Monitor platelets by anticoagulation protocol: Yes ? ?Baseline Labs: ?aPTT - 55 ?Hgb - 8.9 ?Plts - 128 ?Heparin level - >1.10 ? ?Date Time aPTT/HL Rate/Comment ?3/07 1521   55/>1.10 ?3/08 1600   4200 bolus + 1300 u/hr ?3/09 0039 >200/  Pause x1 hr 1300 > 1100 u/hr ?3/09 0500 >200/>1.10 Pause x1 hr 1100 > 800 u/hr ?3/10 0522 119 / 0.67  ?  ? ?Plan ?aPTT supratherapeutic, HL on high end of therapeutic range.Given recent supratherapeutic heparin trends, will drop infusion rate slightly to 750 units. ?Will recheck HL in 8 hours after rate change to confirm then daily if consecutively therapeutic.  ?Continue to monitor H&H and platelets daily while on heparin gtt. ? ?Renda Rolls, PharmD, MBA ?05/13/2021 ?6:13 AM ? ? ? ? ? ? ?

## 2021-05-13 NOTE — Assessment & Plan Note (Signed)
Pressure Injury 05/11/21 Sacrum Mid Stage 2 -  Partial thickness loss of dermis presenting as a shallow open injury with a red, pink wound bed without slough. cluster of 3 stage 2s (Active)  ?05/11/21 0230  ?Location: Sacrum  ?Location Orientation: Mid  ?Staging: Stage 2 -  Partial thickness loss of dermis presenting as a shallow open injury with a red, pink wound bed without slough.  ?Wound Description (Comments): cluster of 3 stage 2s  ?Present on Admission: Yes  ?   ?Pressure Injury 05/11/21 Coccyx Stage 1 -  Intact skin with non-blanchable redness of a localized area usually over a bony prominence. (Active)  ?05/11/21 0230  ?Location: Coccyx  ?Location Orientation:   ?Staging: Stage 1 -  Intact skin with non-blanchable redness of a localized area usually over a bony prominence.  ?Wound Description (Comments):   ?Present on Admission: Yes  ? ? ? ? ? ?

## 2021-05-13 NOTE — Progress Notes (Signed)
Chart reviewed. Unable to proceed with left femoral access to assess circulation.  ? ?Podiatry will plan on right hallux amputation, likely tomorrow.  ? ?I will be coming by tonight to see the patient and discuss.  ?

## 2021-05-13 NOTE — Progress Notes (Signed)
Subjective: ?71 y.o. male with medical history including HTN, PE on Eliquis, HTN, HLD, PAD, prediabetes, CVA with residual left hemiplegia, PEG tube dependent, nonverbal admitted on 05/10/2021 with  abnormal circulation and OM of R first toe.  ? ?Objective: ?AAO x3, NAD ?Ulceration noted to the distal aspect of the right hallux with gangrenous changes.  No frank purulence is identified at this time.  No increased temperature.  Also to the posterior heel is a Psychologist, occupational.  No fluctuation or crepitation.  Unable to fully evaluate the left heel due to contracture not able to tolerate this.  He has a bandage intact before and I could see no visible wound. ? ? ? ? ? ? ?Assessment: ?Osteomyelitis, PAD ? ?Plan: ?Difficult situation.  I had discussion with the patient's daughter over the phone.  We discussed pros and cons of surgery.  At the end of the discussion the patient's daughter would like to hold off on surgical intervention she would likely want to proceed with hospice care.  Due to this we will cancel surgery for tomorrow.  Consider suppressive antibiotics.  Will need further discussion with palliative care.  ? ?Celesta Gentile, DPM ?

## 2021-05-13 NOTE — Consult Note (Signed)
Pharmacy Antibiotic Note ? ?Clifford [name] is a 71 y.o. male with medical history including HTN, PE on Eliquis, HTN, HLD, PAD, prediabetes, CVA with residual left hemiplegia, PEG tube dependent, nonverbal admitted on 05/10/2021 with  abnormal circulation and OM of R first toe .  Pharmacy has been consulted for cefepime and vancomycin dosing. ? ?Plan is for right hallux amputation with podiatry on 3/11 ? ?Plan: ? ?Cefepime 2 g IV q8h ? ?Vancomycin 1.5 g IV q24h ?--Calculated AUC: 467, Cmin 9.6 ?--Daily Scr per protocol ?--Tentatively obtain levels in 1-2 days ? ?Follow-up antibiotic plan after amputation / cultures / ability to convert to oral therapy ? ?Height: '5\' 7"'$  (170.2 cm) ?Weight: 74.9 kg (165 lb 2 oz) ?IBW/kg (Calculated) : 66.1 ? ?Temp (24hrs), Avg:98.3 ?F (36.8 ?C), Min:97.7 ?F (36.5 ?C), Max:98.7 ?F (37.1 ?C) ? ?Recent Labs  ?Lab 05/10/21 ?1521 05/10/21 ?1839 05/11/21 ?0159 05/11/21 ?0507 05/12/21 ?0510 05/12/21 ?1452 05/13/21 ?0522  ?WBC 7.4  --   --  8.5 6.0  --  5.5  ?CREATININE  --  1.12  --  1.18 0.94 1.01 0.94  ?LATICACIDVEN 1.8 2.3* 1.3 2.0*  --   --   --   ?  ?Estimated Creatinine Clearance: 66.4 mL/min (by C-G formula based on SCr of 0.94 mg/dL).   ? ?Allergies  ?Allergen Reactions  ? Apixaban   ?  Other reaction(s): Liver enzymes abnormal  ? Lisinopril   ?  Other reaction(s): Cough (finding)  ? Simvastatin   ?  Other reaction(s): Pain in lower limb (finding)  ? ? ?Antimicrobials this admission: ?Cefepime 3/7 >>  ?Vancomycin 3/7 >>  ? ?Dose adjustments this admission: ?N/A ? ?Microbiology results: ?3/8 BCx: NGTD ? ?Thank you for allowing pharmacy to be a part of this patient?s care. ? ?Benita Gutter ?05/13/2021 10:40 AM ? ?

## 2021-05-13 NOTE — Progress Notes (Addendum)
PROGRESS NOTE   JOURNEY RATTERMAN  UXL:[id number]    DOB: 12/17/1950    DOA: 05/10/2021  PCP: Pcp, No   I have briefly reviewed patients previous medical records [facility].  Chief Complaint  Patient presents with   femoral occlusion    Hospital Course:  71 year old male, SNF resident, medical history significant for HTN, pulmonary embolism on Eliquis, HLD, PAD, prediabetes, CVA with residual left hemiplegia, PEG tube dependent, nonverbal, sent from SNF due to abnormal USG that showed abnormal circulation.  In ED, dopplerable pulses in both feet.  Podiatry/Dr. Amalia Hailey consulted for suspected osteomyelitis of right first toe.  Vascular surgery consulted.  Unsuccessful attempt to do lower extremity angiogram.  Podiatry planning right hallux amputation 3/11 but awaiting patient's daughter to arrive from out of town, see her father and make a final decision after discussing with her siblings if this is in patient's best interest.   Assessment & Plan:  Principal Problem:   Acute osteomyelitis of toe, right ([place]) Active Problems:   PAD (peripheral artery disease) ([place])   AMS (altered mental status)   Stage 3a chronic kidney disease (CKD) ([place])   Essential hypertension   Chronic diastolic congestive heart failure ([place])   History of pulmonary embolism   Chronic anticoagulation   Hypernatremia   Lactic acidosis   Thrombocytopenia (HCC)   Elevated troponin level   Normocytic anemia   Failure to thrive in adult   Malnutrition of moderate degree   Pressure injury of skin   Junctional tachycardia (HCC)   Assessment and Plan: * Acute osteomyelitis of toe, right (HCC) Right hallux gangrene and osteomyelitis Podiatry/Dr. Amalia Hailey consultation appreciated For now continue IV vancomycin and cefepime.  Unable to complete MRI of lower extremity due to left sided contractures. As per podiatry recommendations, vascular surgery consulted to determine PAD status for any potential surgical  intervention Suspects he will need at least a partial or total hallux amputation. As per vascular surgery, due to extreme left lower extremity contractures, unable to perform angiogram and recommend proceeding with surgery as needed.  Podiatry follow-up appreciated, planning on right hallux amputation, likely 3/11. However there is concern from multiple providers, nursing if this would be in patient's best interest.  I had a very long discussion with patient's daughter who is on her way to the hospital and will make a final decision regarding surgery after visiting with the patient and discussing with her siblings.  PAD (peripheral artery disease) ([place]) As per triage notes, presented to the ED via EMS [facility] for right femoral artery occlusion Imaging was done 3/4 but did not receive results until day of ED visit. Vascular surgery/Dr. Lucky Cowboy consultation appreciated.  Reportedly noninvasive imaging previously done was of left lower extremity which showed femoral occlusion but current gangrenous changes were of right first toe. As noted above, unable to perform angiogram due to severe left lower extremity contractures.  AMS (altered mental status) As per discussion with patient's brother on 3/8, up to 2 to 3 days PTA, patient reportedly was conversing with him and appropriately so.  The AMS seems to be subacute or acute. Etiology unclear.?  Infectious.?  Suspect acute CVA.  CT head shows chronic right MCA infarct.  Hypodensity in the right thalamus and splenium of the corpus callosum which was not identified on prior MRI 10/28/2020 and could be a new area of infarct.  No hemorrhage.  Patient unable to tolerate MRI due to left-sided contractures.  Repeat head CT  on 3/8 showed chronic infarct. In discussing with patient's family, it appears that his mental status changes are acute compared to a few days ago while he was in SNF.  Even if this was an acute stroke, not sure there is much else to  do at this time. Pt is on eliquis which was held for procedures and on IV heparin. Overall poor prognosis due to advanced age, debilitating CVA and multiple comorbidities and current acute illness.  This was discussed with daughter today.  Stage 3a chronic kidney disease (CKD) ([place]) Unclear if he actually has stage II CKD or stage IIIa. Creatinine normal.  Essential hypertension Controlled.  Chronic diastolic congestive heart failure (HCC) Compensated.  Chronic anticoagulation Eliquis on hold.  History of pulmonary embolism Eliquis held for possible procedures.  Continue IV heparin  Hypernatremia Serum sodium has actually increased from 146-1 50. Likely related to poor oral intake/inadequate free water through PEG tube Continue free water via PEG tube and initiated IV D5 infusion. Resolved.  Continue to follow BMP  Lactic acidosis Suspected due to hypotension and poor tissue perfusion.  Serum lactate has fluctuated, peaked at 2.3 and down to 2.  Thrombocytopenia (HCC) Resolved  Elevated troponin level Creatinine elevated in the 120-130 range, flat trend.  No history of chest pain available.   ekg shows sr and mild twi in lateral leads.  Suspect demand ischemia.we will obtain echo.  With his h/o anemia we will cont eliquis wit no addition of additional Antiplatelet and anticoagulation.  Pt is also thrombocytopenic.  Last echo 8/22: LVEF 65-70%.  Normocytic anemia No overt bleeding. Transfuse if hemoglobin 7 g or less. Hemoglobin up to 9.6. Follow CBC periodically  Failure to thrive in adult Extremely poor prognosis. Palliative care on board with ongoing [place] discussions with patient's daughter.  Malnutrition of moderate degree Nutrition Status: Nutrition Problem: Moderate Malnutrition Etiology: chronic illness (CVA) Signs/Symptoms: mild fat depletion, moderate fat depletion, mild muscle depletion, moderate muscle depletion Interventions: MVI, Prostat, Tube feeding    Unsafe for any PO intake at this time, so only tube feeds and meds.  Pressure injury of skin Pressure Injury 05/11/21 Sacrum Mid Stage 2 -  Partial thickness loss of dermis presenting as a shallow open injury with a red, pink wound bed without slough. cluster of 3 stage 2s (Active)  05/11/21 0230  Location: Sacrum  Location Orientation: Mid  Staging: Stage 2 -  Partial thickness loss of dermis presenting as a shallow open injury with a red, pink wound bed without slough.  Wound Description (Comments): cluster of 3 stage 2s  Present on Admission: Yes     Pressure Injury 05/11/21 Coccyx Stage 1 -  Intact skin with non-blanchable redness of a localized area usually over a bony prominence. (Active)  05/11/21 0230  Location: Coccyx  Location Orientation:   Staging: Stage 1 -  Intact skin with non-blanchable redness of a localized area usually over a bony prominence.  Wound Description (Comments):   Present on Admission: Yes        Junctional tachycardia ([place]) Noted telemetry with junctional tachycardia at 120 bpm for approximately 30 minutes. Patient asymptomatic and does not appear in pain either. If sustains, as needed IV low-dose metoprolol.  Discussed with patient's RN.  Body mass index is 25.86 kg/m.  Nutritional Status Nutrition Problem: Moderate Malnutrition Etiology: chronic illness (CVA) Signs/Symptoms: mild fat depletion, moderate fat depletion, mild muscle depletion, moderate muscle depletion Interventions: MVI, Prostat, Tube feeding  Pressure Ulcer:  DVT prophylaxis:   SCDs.  Eliquis on hold.   Code Status: DNR:  Family Communication: Daughter via phone Disposition:  Status is: Inpatient Remains inpatient appropriate because: Severity of illness, possible need for interventions    Consultants:   Podiatry Vascular surgery  Procedures:     Antimicrobials:   As above   Subjective:  No change in status over the last 3 days.  Remains nonverbal  and noncommunicative.  Objective:   Vitals:   05/13/21 0500 05/13/21 0749 05/13/21 1126 05/13/21 1702  BP:  (!) 153/70 140/79 132/90  Pulse:  71 76 (!) 126  Resp:  '18 17 20  '$ Temp:  98.2 F (36.8 C) 98 F (36.7 C) 97.8 F (36.6 C)  TempSrc:  Oral Oral   SpO2:  100% 100% 100%  Weight: 74.9 kg     Height:        General exam: Elderly male, moderately built and poorly nourished lying propped up in bed comfortably.  Chronically ill looking. Respiratory system: Clear to auscultation but poor inspiratory effort. Respiratory effort normal. Cardiovascular system: S1 & S2 heard, RRR. No JVD, murmurs, rubs, gallops or clicks. No pedal edema.  Telemetry personally reviewed: Mostly in sinus rhythm but had occasional episode of junctional tachycardia with heart rates in the 120s.  Patient asymptomatic Gastrointestinal system: Abdomen is nondistended, soft and nontender. No organomegaly or masses felt. Normal bowel sounds heard. Central nervous system: Intermittently opens eyes but does not track.  Nonverbal to me but as per daughter, she was able to talk to him even 2 days ago.  Bilateral mature cataracts and arcus analysis.  No gaze palsy.  No nystagmus.  No facial asymmetry.  Nonverbal and does not follow instructions.   Extremities: Occasional purposeful movements of right hand.  Left sided hemiparesis with contractures.  Left elbow/forearm flexed contracted across abdomen.  Left lower extremity contracted at the knee. Skin: As per RN, has sacral ulcer, bilateral heel ulcers.  Exam as per [place] RN. Psychiatry: Judgement and insight impaired. Mood & affect cannot be assessed.    Data Reviewed:   I have personally reviewed following labs and imaging studies   CBC: Recent Labs  Lab 05/10/21 1521 05/11/21 0507 05/12/21 0510 05/13/21 0522  WBC 7.4 8.5 6.0 5.5  NEUTROABS 5.1  --   --   --   HGB 10.2* 8.9* 8.8* 9.6*  HCT 34.7* 30.2* 29.0* 31.8*  MCV 86.8 86.0 85.5 84.4  PLT 142* 128* 139*  604    Basic Metabolic Panel: Recent Labs  Lab 05/10/21 1839 05/11/21 0507 05/11/21 1618 05/12/21 0510 05/12/21 1452 05/12/21 1646 05/13/21 0522  NA 146* 150*  --  143  --   --  143  K 4.4 4.0  --  3.2*  --   --  3.5  CL 115* 122*  --  113*  --   --  117*  CO2 21* 24  --  20*  --   --  20*  GLUCOSE 106* 104*  --  108*  --   --  128*  BUN 43* 39*  --  36* 34*  --  29*  CREATININE 1.12 1.18  --  0.94 1.01  --  0.94  CALCIUM 9.1 9.0  --  8.7*  --   --  8.5*  MG 2.0  --  1.9 1.9  --  1.9 2.0  PHOS  --   --  3.5 3.4  --  2.9 2.5    Liver Function  Tests: Recent Labs  Lab 05/10/21 1839  AST 36  ALT 28  ALKPHOS 60  BILITOT 0.8  PROT 7.0  ALBUMIN 2.9*    CBG: Recent Labs  Lab 05/13/21 0751 05/13/21 1137 05/13/21 1822  GLUCAP 96 91 125*    Microbiology Studies:   Recent Results (from the past 240 hour(s))  Resp Panel by RT-PCR (Flu A&B, Covid) Nasopharyngeal Swab     Status: None   Collection Time: 05/10/21  3:21 PM   Specimen: Nasopharyngeal Swab; Nasopharyngeal(NP) swabs in vial transport medium  Result Value Ref Range Status   SARS Coronavirus 2 by RT PCR NEGATIVE NEGATIVE Final    Comment: (NOTE) SARS-CoV-2 target nucleic acids are NOT DETECTED.  The SARS-CoV-2 RNA is generally detectable in upper respiratory specimens during the acute phase of infection. The lowest concentration of SARS-CoV-2 viral copies this assay can detect is 138 copies/mL. A negative result does not preclude SARS-Cov-2 infection and should not be used as the sole basis for treatment or other patient management decisions. A negative result may occur with  improper specimen collection/handling, submission of specimen other than nasopharyngeal swab, presence of viral mutation(s) within the areas targeted by this assay, and inadequate number of viral copies(<138 copies/mL). A negative result must be combined with clinical observations, patient history, and epidemiological information.  The expected result is Negative.  Fact Sheet for Patients:  EntrepreneurPulse.com.au  Fact Sheet for Healthcare Providers:  IncredibleEmployment.be  This test is no t yet approved or cleared by the [place] FDA and  has been authorized for detection and/or diagnosis of SARS-CoV-2 by FDA under an Emergency Use Authorization (EUA). This EUA will remain  in effect (meaning this test can be used) for the duration of the COVID-19 declaration under Section 564(b)(1) of the Act, 21 U.S.C.section 360bbb-3(b)(1), unless the authorization is terminated  or revoked sooner.       Influenza A by PCR NEGATIVE NEGATIVE Final   Influenza B by PCR NEGATIVE NEGATIVE Final    Comment: (NOTE) The Xpert Xpress SARS-CoV-2/FLU/RSV plus assay is intended as an aid in the diagnosis of influenza from Nasopharyngeal swab specimens and should not be used as a sole basis for treatment. Nasal washings and aspirates are unacceptable for Xpert Xpress SARS-CoV-2/FLU/RSV testing.  Fact Sheet for Patients: EntrepreneurPulse.com.au  Fact Sheet for Healthcare Providers: IncredibleEmployment.be  This test is not yet approved or cleared by the [place] FDA and has been authorized for detection and/or diagnosis of SARS-CoV-2 by FDA under an Emergency Use Authorization (EUA). This EUA will remain in effect (meaning this test can be used) for the duration of the COVID-19 declaration under Section 564(b)(1) of the Act, 21 U.S.C. section 360bbb-3(b)(1), unless the authorization is terminated or revoked.  Performed at [facility], [address] [place], [place] [address]   Blood culture (routine x 2)     Status: None (Preliminary result)   Collection Time: 05/10/21  6:39 PM   Specimen: BLOOD  Result Value Ref Range Status   Specimen Description BLOOD LEFT HAND  Final   Special Requests   Final    BOTTLES DRAWN AEROBIC  ONLY Blood Culture adequate volume   Culture   Final    NO GROWTH 3 DAYS Performed at [facility], [address] [place], [place] [address]    Report Status PENDING  Incomplete  Culture, blood (Routine X 2) w Reflex to ID Panel     Status: None (Preliminary result)   Collection Time: 05/11/21  1:59 AM   Specimen: BLOOD  Result Value Ref Range Status   Specimen Description BLOOD RIGHT ASSIST CONTROL  Final   Special Requests   Final    BOTTLES DRAWN AEROBIC AND ANAEROBIC Blood Culture adequate volume   Culture   Final    NO GROWTH 2 DAYS Performed at [facility], [address] [place], [place] [address]    Report Status PENDING  Incomplete    Radiology Studies:  PERIPHERAL VASCULAR CATHETERIZATION  Result Date: 05/12/2021 See surgical note for result.   Scheduled Meds:    atorvastatin  40 mg Per Tube QHS   famotidine  20 mg Per Tube BID   feeding supplement (PROSource TF)  45 mL Per Tube Daily   free water  155 mL Per Tube Q4H   levETIRAcetam  250 mg Per Tube BID   pantoprazole sodium  40 mg Per Tube BID   topiramate  50 mg Per Tube BID    Continuous Infusions:    ceFEPime (MAXIPIME) IV 2 g (05/13/21 1322)   feeding supplement (OSMOLITE 1.5 CAL) 1,000 mL (05/13/21 1203)   heparin 750 Units/hr (05/13/21 7846)   vancomycin 1,500 mg (05/13/21 1630)     LOS: 3 days     Vernell Leep, MD,  FACP, [facility], [facility], [facility] (Care Management Physician Certified) [place]  To contact the attending provider between 7A-7P or the covering provider during after hours 7P-7A, please log into the web site [email or web address] and access using universal [place] password for that web site. If you do not have the password, please call the hospital operator.  05/13/2021, 6:25 PM

## 2021-05-13 NOTE — Consult Note (Signed)
ANTICOAGULATION CONSULT NOTE  ? ?Pharmacy Consult for IV Heparin ?Indication: Hx of PE ? ?Allergies  ?Allergen Reactions  ? Apixaban   ?  Other reaction(s): Liver enzymes abnormal  ? Lisinopril   ?  Other reaction(s): Cough (finding)  ? Simvastatin   ?  Other reaction(s): Pain in lower limb (finding)  ? ? ?Patient Measurements: ?Height: '5\' 7"'$  (170.2 cm) ?Weight: 74.9 kg (165 lb 2 oz) ?IBW/kg (Calculated) : 66.1 ?Heparin Dosing Weight: 84.4kg ? ?Labs: ?Recent Labs  ?  05/10/21 ?1521 05/10/21 ?1521 05/10/21 ?1839 05/11/21 ?0159 05/11/21 ?0507 05/11/21 ?1618 05/12/21 ?8588 05/12/21 ?0510 05/12/21 ?1452 05/13/21 ?0522 05/13/21 ?1421  ?HGB 10.2*  --   --   --  8.9*  --   --  8.8*  --  9.6*  --   ?HCT 34.7*  --   --   --  30.2*  --   --  29.0*  --  31.8*  --   ?PLT 142*  --   --   --  128*  --   --  139*  --  157  --   ?APTT 55*  --   --   --   --   --  >200* >200*  --  119*  --   ?LABPROT 19.5*  --   --   --   --   --   --   --   --   --   --   ?INR 1.7*  --   --   --   --   --   --   --   --   --   --   ?HEPARINUNFRC  --   --   --   --   --    < >  --  >1.10*  --  0.67 0.50  ?CREATININE  --    < > 1.12  --  1.18  --   --  0.94 1.01 0.94  --   ?TROPONINIHS  --   --  134* 122* 121*  --   --   --   --   --   --   ? < > = values in this interval not displayed.  ? ? ? ?Estimated Creatinine Clearance: 66.4 mL/min (by C-G formula based on SCr of 0.94 mg/dL). ? ? ?Medical History: ?Past Medical History:  ?Diagnosis Date  ? BPH (benign prostatic hyperplasia)   ? Chronic anticoagulation 10/14/2020  ? Essential hypertension 10/06/2020  ? History of pulmonary embolism 10/14/2020  ? HLD (hyperlipidemia)   ? HTN (hypertension)   ? Lung nodule   ? 12m in 2015  ? Malignant neoplasm of left kidney (HCC)   ? Mixed hyperlipidemia 10/06/2020  ? Nicotine dependence, cigarettes, uncomplicated 85/04/7739 ? Peripheral vascular disease ([place]   ? Polyp of colon   ? Prediabetes 10/14/2020  ? [place]monary embolus ([place]   ? Stroke ([facility] 10/06/2020   ? ? ?Medications:  ?PTA: Eliquis '5mg'$  BID ?Inpatient: Eliquis > 3/8 > Heparin Drip (3/8 >>>) ?Allergies: reported abnormal liver enzymes on Eliquis ? ?Assessment: ?71year old male, SNF resident, medical history significant for HTN, pulmonary embolism on Eliquis, HLD, PAD, prediabetes, CVA with residual left hemiplegia, PEG tube dependent, nonverbal, sent from SNF due to abnormal USG that showed abnormal circulation.  In ED, dopplerable pulses in both feet.  Podiatry/Dr. EAmalia Haileyconsulted for suspected osteomyelitis of right first toe.  Vascular surgery consulted as well. Pharmacy consulted for management of heparin drip in the setting  of history for PE. ? ?Goal of Therapy:  ?Heparin level 0.3-0.7 units/ml ?aPTT 66-102 seconds ?Monitor platelets by anticoagulation protocol: Yes ? ?Baseline Labs: ?aPTT - 55 ?Hgb - 8.9 ?Plts - 128 ?Heparin level - >1.10 ?  ? ?Plan ?Heparin level is therapeutic x 1. Continue heparin infusion at 750 units/hr ?Will re-check HL in 8 hours to confirm ?Daily CBC per protocol ?Patient is pending right hallux amputation for OM with podiatry tomorrow; per podiatry, okay to continue IV heparin until patient called back to OR ?Follow-up transition back onto home apixaban once procedures completed ? ?Benita Gutter  ?05/13/2021 ?2:47 PM ? ? ? ? ? ? ?

## 2021-05-14 ENCOUNTER — Encounter
Admission: EM | Disposition: A | Discharge status: Skilled Nursing Facility | Payer: Self-pay | Source: Home / Self Care | Attending: Internal Medicine

## 2021-05-14 DIAGNOSIS — Z515 Encounter for palliative care: Secondary | ICD-10-CM

## 2021-05-14 LAB — CBC
HCT: 30.4 % — ABNORMAL LOW (ref 39.0–52.0)
Hemoglobin: 9.1 g/dL — ABNORMAL LOW (ref 13.0–17.0)
MCH: 25.3 pg — ABNORMAL LOW (ref 26.0–34.0)
MCHC: 29.9 g/dL — ABNORMAL LOW (ref 30.0–36.0)
MCV: 84.4 fL (ref 80.0–100.0)
Platelets: 150 10*3/uL (ref 150–400)
RBC: 3.6 MIL/uL — ABNORMAL LOW (ref 4.22–5.81)
RDW: 14.9 % (ref 11.5–15.5)
WBC: 6 10*3/uL (ref 4.0–10.5)
nRBC: 0 % (ref 0.0–0.2)

## 2021-05-14 LAB — HEPARIN LEVEL (UNFRACTIONATED): Heparin Unfractionated: 0.3 IU/mL (ref 0.30–0.70)

## 2021-05-14 LAB — GLUCOSE, CAPILLARY
Glucose-Capillary: 106 mg/dL — ABNORMAL HIGH (ref 70–99)
Glucose-Capillary: 109 mg/dL — ABNORMAL HIGH (ref 70–99)
Glucose-Capillary: 109 mg/dL — ABNORMAL HIGH (ref 70–99)
Glucose-Capillary: 92 mg/dL (ref 70–99)

## 2021-05-14 LAB — CREATININE, SERUM
Creatinine, Ser: 0.93 mg/dL (ref 0.61–1.24)
GFR, Estimated: >60 mL/min (ref >60)

## 2021-05-14 SURGERY — AMPUTATION, TOE
Anesthesia: Choice | Site: Toe | Laterality: Right

## 2021-05-14 MED ORDER — GLYCOPYRROLATE 0.2 MG/ML IJ SOLN: 0.2000 mg | INTRAMUSCULAR | Status: DC | PRN

## 2021-05-14 MED ORDER — SODIUM CHLORIDE 0.9 % IV SOLN
250.0000 mL | INTRAVENOUS | Status: DC | PRN
Start: 2021-05-14 — End: 2021-05-16

## 2021-05-14 MED ORDER — LORAZEPAM 2 MG/ML IJ SOLN
1.0000 mg | INTRAMUSCULAR | Status: DC | PRN
  Administered 2021-05-15 (×2): 1 mg via INTRAVENOUS
  Filled 2021-05-14 (×2): qty 1

## 2021-05-14 MED ORDER — SODIUM CHLORIDE 0.9% FLUSH: 3.0000 mL | INTRAVENOUS | Status: DC | PRN

## 2021-05-14 MED ORDER — GLYCOPYRROLATE 0.2 MG/ML IJ SOLN
0.2000 mg | INTRAMUSCULAR | Status: DC | PRN
Start: 2021-05-14 — End: 2021-05-16

## 2021-05-14 MED ORDER — LORAZEPAM 1 MG PO TABS: 1.0000 mg | ORAL_TABLET | ORAL | Status: DC | PRN

## 2021-05-14 MED ORDER — MORPHINE SULFATE (CONCENTRATE) 10 MG/0.5ML PO SOLN: 5.0000 mg | ORAL | Status: DC | PRN

## 2021-05-14 MED ORDER — GLYCOPYRROLATE 1 MG PO TABS
1.0000 mg | ORAL_TABLET | ORAL | Status: DC | PRN
  Filled 2021-05-14: qty 1

## 2021-05-14 MED ORDER — LORAZEPAM 2 MG/ML IJ SOLN: 1.0000 mg | Freq: Once | INTRAMUSCULAR | Status: AC

## 2021-05-14 MED ORDER — SODIUM CHLORIDE 0.9% FLUSH
3.0000 mL | Freq: Two times a day (BID) | INTRAVENOUS | Status: DC
  Administered 2021-05-14 – 2021-05-16 (×4): 3 mL via INTRAVENOUS

## 2021-05-14 MED ORDER — MORPHINE SULFATE (CONCENTRATE) 10 MG/0.5ML PO SOLN
5.0000 mg | ORAL | Status: DC | PRN
  Administered 2021-05-14 – 2021-05-15 (×2): 5 mg via ORAL
  Filled 2021-05-14 (×2): qty 0.5

## 2021-05-14 MED ORDER — LORAZEPAM 2 MG/ML IJ SOLN
INTRAMUSCULAR | Status: AC
  Administered 2021-05-14: 1 mg via INTRAVENOUS
  Filled 2021-05-14: qty 1

## 2021-05-14 MED ORDER — LORAZEPAM 2 MG/ML PO CONC
1.0000 mg | ORAL | Status: DC | PRN
Start: 2021-05-14 — End: 2021-05-16

## 2021-05-14 NOTE — TOC Progression Note (Addendum)
Transition of Care (TOC) - Progression Note  ? ? ?Patient Details  ?Name: Clifford [name] ?MRN: [id number] ?Date of Birth: [birth year] ? ?Transition of Care (TOC) CM/SW Contact  ?Kalsey Lull E Romana Deaton, LCSW ?Phone Number: ?05/14/2021, 1:02 PM ? ?Clinical Narrative:    ?Notified by MD that patient is ready to DC back to SNF ([facility]) with hospice/comfort care. ?Attempted call and text to Neoma Laming at [facility], no answer. Left a message requesting a call back.  ?Called main number, was transferred to DON line with no answer.  ? ? ?1:15-Spoke to [facility] of [place] who reported patient can come back today. She requested hard copies of patient's morphine and ativan prescriptions, MD agreed to send these with patient, RN also included in chat about this.  ?Dedra Skeens stated she will set up [facility] and reported CSW does not need to do anything to set up hospice. CSW did attempt to call Adriane with [facility], no answer. Dedra Skeens again confirmed [facility] will reach out to [facility] directly.  ?Spoke to daughter Clifford [name] who confirmed plan for DC back to [facility] with hospice, no hospice agency preference. She requested a private room, Dedra Skeens confirmed patient will have a private room.  ?Asked MD to sign DNR. ?Asked RN to call report to Dedra Skeens at [phone] ? ? ?1:30- Call from [facility] who says now they cant get patient's medications until Monday, even with hard prescriptions. She said they cannot pick up from a local pharmacy other than theirs that closes at 1:30.  ?She said if we can set up hospice through [facility] can provide the medications today that patient can come back today. Spoke with Anderson Malta with [facility] who reported this is not how the process works. ?Notified MD, RN, and patient's daughter. Plan for DC to [facility] with [facility] on Monday per Dedra Skeens Civil engineer, contracting of Nursing).  ? ?Expected Discharge Plan: [place] ?Barriers to  Discharge: Continued Medical Work up ? ?Expected Discharge Plan and Services ?Expected Discharge Plan: [place] ?In-house Referral: Clinical Social Work ?  ?Post Acute Care Choice: [place] ?Living arrangements for the past 2 months: [place] ?                ?  ?  ?  ?  ?  ?  ?  ?  ?  ?  ? ? ?Social Determinants of Health (SDOH) Interventions ?  ? ?Readmission Risk Interventions ?Readmission Risk Prevention Plan 12/14/2020  ?Transportation Screening Complete  ?PCP or Specialist Appt within 3-5 Days Complete  ?[place] or Home Care Consult Complete  ?Social Work Consult for [place] Planning/Counseling Complete  ?Palliative Care Screening Complete  ?Medication Review Press photographer) Complete  ? ? ?

## 2021-05-14 NOTE — Consult Note (Signed)
ANTICOAGULATION CONSULT NOTE  ? ?Pharmacy Consult for IV Heparin ?Indication: Hx of PE ? ?Allergies  ?Allergen Reactions  ? Apixaban   ?  Other reaction(s): Liver enzymes abnormal  ? Lisinopril   ?  Other reaction(s): Cough (finding)  ? Simvastatin   ?  Other reaction(s): Pain in lower limb (finding)  ? ? ?Patient Measurements: ?Height: '5\' 7"'$  (170.2 cm) ?Weight: 77.4 kg (170 lb 10.2 oz) ?IBW/kg (Calculated) : 66.1 ?Heparin Dosing Weight: 84.4kg ? ?Labs: ?Recent Labs  ?  05/11/21 ?1618 05/12/21 ?6720 05/12/21 ?0510 05/12/21 ?1452 05/13/21 ?0522 05/13/21 ?1421 05/13/21 ?2205 05/14/21 ?9470  ?HGB   < >  --  8.8*  --  9.6*  --   --  9.1*  ?HCT  --   --  29.0*  --  31.8*  --   --  30.4*  ?PLT  --   --  139*  --  157  --   --  150  ?APTT  --  >200* >200*  --  119*  --   --   --   ?HEPARINUNFRC  --   --  >1.10*  --  0.67 0.50 0.35 0.30  ?CREATININE   < >  --  0.94 1.01 0.94  --   --  0.93  ? < > = values in this interval not displayed.  ? ? ? ?Estimated Creatinine Clearance: 67.1 mL/min (by C-G formula based on SCr of 0.93 mg/dL). ? ? ?Medical History: ?Past Medical History:  ?Diagnosis Date  ? BPH (benign prostatic hyperplasia)   ? Chronic anticoagulation 10/14/2020  ? Essential hypertension 10/06/2020  ? History of pulmonary embolism 10/14/2020  ? HLD (hyperlipidemia)   ? HTN (hypertension)   ? Lung nodule   ? 93m in 2015  ? Malignant neoplasm of left kidney (HCC)   ? Mixed hyperlipidemia 10/06/2020  ? Nicotine dependence, cigarettes, uncomplicated 89/62/8366 ? Peripheral vascular disease ([place]   ? Polyp of colon   ? Prediabetes 10/14/2020  ? [place]monary embolus ([facility]   ? [facility] 10/06/2020  ? ? ?Medications:  ?PTA: Eliquis '5mg'$  BID ?Inpatient: Eliquis > 3/8 > Heparin Drip (3/8 >>>) ?Allergies: reported abnormal liver enzymes on Eliquis ? ?Assessment: ?71year old male, SNF resident, medical history significant for HTN, pulmonary embolism on Eliquis, HLD, PAD, prediabetes, CVA with residual left hemiplegia, PEG tube dependent,  nonverbal, sent from SNF due to abnormal USG that showed abnormal circulation.  In ED, dopplerable pulses in both feet.  Podiatry/Dr. EAmalia Haileyconsulted for suspected osteomyelitis of right first toe.  Vascular surgery consulted as well. Pharmacy consulted for management of heparin drip in the setting of history for PE. ? ?3/11 0438 HL 0.3  ? ?Goal of Therapy:  ?Heparin level 0.3-0.7 units/ml ?aPTT 66-102 seconds ?Monitor platelets by anticoagulation protocol: Yes ?  ? ?Plan:  ?Heparin level is therapeutic but at the lower end of goal. Increase heparin infusion at 800 units/hr. Recheck heparin level and CBC with AM labs. Patient is pending right hallux amputation for OM with podiatry tomorrow; per podiatry, okay to continue IV heparin until patient called back to OR. Follow-up transition back onto home apixaban once procedures completed ? ?KOswald Hillock PharmD, BCPS ?05/14/2021 ?7:19 AM ? ? ? ? ? ? ?

## 2021-05-14 NOTE — Assessment & Plan Note (Addendum)
After careful consideration, patient's daughter finally transitioned patient to full comfort care on 05/14/2021. ?As per detailed discussion with her, discontinued all medications nonessential to comfort including tube feeds, labs and meds.  Antiseizure medications were continued to avoid discomfort if he has seizures. ?Unfortunately SNF unable to take patient until 3/13. ?Focused comfort care order set initiated. ?Prognosis possibly days to less than 2 weeks.  Appears comfortable and no concerns expressed by nursing. ?

## 2021-05-14 NOTE — Progress Notes (Signed)
?  Chaplain On-Call responded to Spiritual Care Consult Order from Clifford Leep MD regarding end of life support for patient's family. ? ?Chaplain met daughters Clifford [name] and Clifford [name] at bedside. ?Patient was non-responsive, and his daughters stated that he opens his eyes occasionally but is non-verbal. ? ?Chaplain provided spiritual and emotional support and prayer with the patient and his daughters. ? ?Chaplain Pollyann Samples ?M.Div., BCC ?

## 2021-05-14 NOTE — Progress Notes (Signed)
PROGRESS NOTE   Clifford [name]  GMW:[id number]    DOB: January 07, 1951    DOA: 05/10/2021  PCP: Pcp, No   I have briefly reviewed patients previous medical records in [facility].  Chief Complaint  Patient presents with   femoral occlusion    Hospital Course:  71 year old male, SNF resident, medical history significant for HTN, pulmonary embolism on Eliquis, HLD, PAD, prediabetes, CVA with residual left hemiplegia, PEG tube dependent, nonverbal, sent from SNF due to abnormal USG that showed abnormal circulation.  In ED, dopplerable pulses in both feet.  Podiatry/Dr. Amalia Hailey consulted for suspected osteomyelitis of right first toe.  Vascular surgery consulted.  Unsuccessful attempt to do lower extremity angiogram.  Podiatry was planning right hallux amputation 3/11 but after d/w daughter, this was canceled because she was considering hospice.  On 3/11, patient's daughter after discussing with her siblings transitioned to full comfort care/hospice.  TOC consulted regarding returning to SNF with hospice but facility unfortunately unable to accept patient back until 3/13.   Assessment & Plan:  Principal Problem:   End of life care Active Problems:   Acute osteomyelitis of toe, right (HCC)   PAD (peripheral artery disease) (HCC)   AMS (altered mental status)   Stage 3a chronic kidney disease (CKD) (HCC)   Essential hypertension   Chronic diastolic congestive heart failure (HCC)   History of pulmonary embolism   Chronic anticoagulation   Hypernatremia   Lactic acidosis   Thrombocytopenia (HCC)   Elevated troponin level   Normocytic anemia   Failure to thrive in adult   Malnutrition of moderate degree   Pressure injury of skin   Junctional tachycardia (HCC)   Assessment and Plan: * End of life care After careful consideration, patient's daughter finally transitioned patient to full comfort care on 05/14/2021. As per detailed discussion with her, discontinued all medications nonessential  to comfort including tube feeds, labs and meds.  Antiseizure medications were continued to avoid discomfort if he has seizures. Unfortunately SNF unable to take patient until 3/13. Focused comfort care order set initiated.  Acute osteomyelitis of toe, right (HCC) Right hallux gangrene and osteomyelitis Podiatry/Dr. Amalia Hailey consultation appreciated Unable to complete MRI of lower extremity due to left sided contractures. As per podiatry recommendations, vascular surgery consulted to determine PAD status for any potential surgical intervention Suspects he will need at least a partial or total hallux amputation. As per vascular surgery, due to extreme left lower extremity contractures, unable to perform angiogram and recommend proceeding with surgery as needed.  Podiatry discussed with patient's daughter on 3/10 and surgery was canceled. As discussed with patient's daughter today, transitioned to comfort care, IV antibiotics discontinued.  PAD (peripheral artery disease) ([place]) As per triage notes, presented to the ED via EMS from [facility] for right femoral artery occlusion Imaging was done 3/4 but did not receive results until day of ED visit. Vascular surgery/Dr. Lucky Cowboy consultation appreciated.  Reportedly noninvasive imaging previously done was of left lower extremity which showed femoral occlusion but current gangrenous changes were of right first toe. As noted above, unable to perform angiogram due to severe left lower extremity contractures.  AMS (altered mental status) As per discussion with patient's brother on 3/8, up to 2 to 3 days PTA, patient reportedly was conversing with him and appropriately so.  The AMS seems to be subacute or acute. Etiology unclear.?  Infectious.?  Suspect acute CVA.  CT head shows chronic right MCA infarct.  Hypodensity in the right  thalamus and splenium of the corpus callosum which was not identified on prior MRI 10/28/2020 and could be a new area of infarct.   No hemorrhage.  Patient unable to tolerate MRI due to left-sided contractures.  Repeat head CT on 3/8 showed chronic infarct. In discussing with patient's family, it appears that his mental status changes are acute compared to a few days ago while he was in SNF.  Even if this was an acute stroke, not sure there is much else to do at this time. Overall poor prognosis due to advanced age, debilitating CVA and multiple comorbidities and current acute illness.  This was discussed with daughter on multiple occasions. Earlier this morning, patient seemed to have sustained left gaze, tried 0.5 mg IV Ativan but made him more drowsy. Now comfort care.  Stage 3a chronic kidney disease (CKD) ([place]) Unclear if he actually has stage II CKD or stage IIIa. Creatinine normal.  Essential hypertension Controlled.  Chronic diastolic congestive heart failure (HCC) Compensated.  Chronic anticoagulation All anticoagulation now discontinued.  History of pulmonary embolism All anticoagulants discontinued.  Hypernatremia Serum sodium has actually increased from 146-1 50. Likely related to poor oral intake/inadequate free water through PEG tube Resolved. Tube feeds including free water stopped.  Lactic acidosis Suspected due to hypotension and poor tissue perfusion.  Serum lactate has fluctuated, peaked at 2.3 and down to 2.  Thrombocytopenia (HCC) Resolved  Elevated troponin level Creatinine elevated in the 120-130 range, flat trend.  No history of chest pain available.   ekg shows sr and mild twi in lateral leads.  Suspect demand ischemia.we will obtain echo.  Pt is also thrombocytopenic.  Last echo 8/22: LVEF 65-70%.  Normocytic anemia No overt bleeding. Transfuse if hemoglobin 7 g or less. Hemoglobin up to 9.6. No further lab draws.  Failure to thrive in adult Extremely poor prognosis. Palliative care on board with ongoing [place] discussions with patient's daughter.  Malnutrition of  moderate degree Nutrition Status: Nutrition Problem: Moderate Malnutrition Etiology: chronic illness (CVA) Signs/Symptoms: mild fat depletion, moderate fat depletion, mild muscle depletion, moderate muscle depletion Interventions: MVI, Prostat, Tube feeding   Discontinued tube feeds.  Pressure injury of skin Pressure Injury 05/11/21 Sacrum Mid Stage 2 -  Partial thickness loss of dermis presenting as a shallow open injury with a red, pink wound bed without slough. cluster of 3 stage 2s (Active)  05/11/21 0230  Location: Sacrum  Location Orientation: Mid  Staging: Stage 2 -  Partial thickness loss of dermis presenting as a shallow open injury with a red, pink wound bed without slough.  Wound Description (Comments): cluster of 3 stage 2s  Present on Admission: Yes     Pressure Injury 05/11/21 Coccyx Stage 1 -  Intact skin with non-blanchable redness of a localized area usually over a bony prominence. (Active)  05/11/21 0230  Location: Coccyx  Location Orientation:   Staging: Stage 1 -  Intact skin with non-blanchable redness of a localized area usually over a bony prominence.  Wound Description (Comments):   Present on Admission: Yes        Junctional tachycardia ([place]) Noted telemetry with accelerated junctional rhythm/junctional tachycardia at 120 bpm that was sustained for several hours yesterday evening Patient asymptomatic and does not appear in pain either. Back in sinus rhythm.  Discontinue telemetry.  Body mass index is 26.73 kg/m.  Nutritional Status Nutrition Problem: Moderate Malnutrition Etiology: chronic illness (CVA) Signs/Symptoms: mild fat depletion, moderate fat depletion, mild muscle depletion, moderate muscle depletion Interventions:  MVI, Prostat, Tube feeding  DVT prophylaxis:   None at this time due to comfort care.   Code Status: DNR:  Family Communication: Daughter via phone Disposition:  Status is: Inpatient  Likely optimized for DC to SNF  pending their ability to accept which is not until 3/13.    Consultants:   Podiatry Vascular surgery Palliative care medicine  Procedures:     Antimicrobials:   As above   Subjective:  Nonverbal, noncommunicative as has been since this hospital admission.  Noted leftward eye gaze, improved some with IV Ativan 0.5 mg x 1 dose but hesitant to give any more given that he started having snoring respirations.  No significant improvement in AMS.  Objective:   Vitals:   05/14/21 0415 05/14/21 0500 05/14/21 0727 05/14/21 1211  BP: 112/86  133/62 (!) 142/68  Pulse: 81  91 80  Resp: '20  20 20  '$ Temp: 98.8 F (37.1 C)  98 F (36.7 C) 98.4 F (36.9 C)  TempSrc:    Oral  SpO2: 100%  100% 92%  Weight:  77.4 kg    Height:        General exam: Elderly male, moderately built and poorly nourished lying propped up in bed comfortably.  Chronically ill looking. Respiratory system: Poor inspiratory effort.  Seems clear to auscultation. Cardiovascular system: S1 & S2 heard, RRR. No JVD, murmurs, rubs, gallops or clicks. No pedal edema.  Telemetry personally reviewed: Sinus rhythm this morning.  Had extended duration/several hours of accelerated junctional rhythm yesterday evening into the night. Gastrointestinal system: Abdomen is nondistended, soft and nontender. No organomegaly or masses felt. Normal bowel sounds heard. Central nervous system: Eye exam as noted above.  Nonverbal and noncommunicative.  Bilateral mature cataracts and arcus analysis.  No nystagmus.  No facial asymmetry.  Nonverbal and does not follow instructions.   Extremities: Left sided hemiparesis with contractures.  Left elbow/forearm flexed contracted across abdomen.  Left lower extremity contracted at the knee. Skin: As per RN, has sacral ulcer, bilateral heel ulcers.  Exam as per [place] RN. Psychiatry: Judgement and insight impaired. Mood & affect cannot be assessed.    Data Reviewed:   I have personally reviewed following  labs and imaging studies   CBC: Recent Labs  Lab 05/10/21 1521 05/11/21 0507 05/12/21 0510 05/13/21 0522 05/14/21 0438  WBC 7.4   < > 6.0 5.5 6.0  NEUTROABS 5.1  --   --   --   --   HGB 10.2*   < > 8.8* 9.6* 9.1*  HCT 34.7*   < > 29.0* 31.8* 30.4*  MCV 86.8   < > 85.5 84.4 84.4  PLT 142*   < > 139* 157 150   < > = values in this interval not displayed.    Basic Metabolic Panel: Recent Labs  Lab 05/10/21 1839 05/11/21 0507 05/11/21 1618 05/12/21 0510 05/12/21 1452 05/12/21 1646 05/13/21 0522 05/14/21 0438  NA 146* 150*  --  143  --   --  143  --   K 4.4 4.0  --  3.2*  --   --  3.5  --   CL 115* 122*  --  113*  --   --  117*  --   CO2 21* 24  --  20*  --   --  20*  --   GLUCOSE 106* 104*  --  108*  --   --  128*  --   BUN 43* 39*  --  36*  34*  --  29*  --   CREATININE 1.12 1.18  --  0.94 1.01  --  0.94 0.93  CALCIUM 9.1 9.0  --  8.7*  --   --  8.5*  --   MG 2.0  --  1.9 1.9  --  1.9 2.0  --   PHOS  --   --  3.5 3.4  --  2.9 2.5  --     Liver Function Tests: Recent Labs  Lab 05/10/21 1839  AST 36  ALT 28  ALKPHOS 60  BILITOT 0.8  PROT 7.0  ALBUMIN 2.9*    CBG: Recent Labs  Lab 05/14/21 0417 05/14/21 0730 05/14/21 1213  GLUCAP 109* 106* 92    Microbiology Studies:   Recent Results (from the past 240 hour(s))  Resp Panel by RT-PCR (Flu A&B, Covid) Nasopharyngeal Swab     Status: None   Collection Time: 05/10/21  3:21 PM   Specimen: Nasopharyngeal Swab; Nasopharyngeal(NP) swabs in vial transport medium  Result Value Ref Range Status   SARS Coronavirus 2 by RT PCR NEGATIVE NEGATIVE Final    Comment: (NOTE) SARS-CoV-2 target nucleic acids are NOT DETECTED.  The SARS-CoV-2 RNA is generally detectable in upper respiratory specimens during the acute phase of infection. The lowest concentration of SARS-CoV-2 viral copies this assay can detect is 138 copies/mL. A negative result does not preclude SARS-Cov-2 infection and should not be used as the sole  basis for treatment or other patient management decisions. A negative result may occur with  improper specimen collection/handling, submission of specimen other than nasopharyngeal swab, presence of viral mutation(s) within the areas targeted by this assay, and inadequate number of viral copies(<138 copies/mL). A negative result must be combined with clinical observations, patient history, and epidemiological information. The expected result is Negative.  Fact Sheet for Patients:  EntrepreneurPulse.com.au  Fact Sheet for Healthcare Providers:  IncredibleEmployment.be  This test is no t yet approved or cleared by the [place] FDA and  has been authorized for detection and/or diagnosis of SARS-CoV-2 by FDA under an Emergency Use Authorization (EUA). This EUA will remain  in effect (meaning this test can be used) for the duration of the COVID-19 declaration under Section 564(b)(1) of the Act, 21 U.S.C.section 360bbb-3(b)(1), unless the authorization is terminated  or revoked sooner.       Influenza A by PCR NEGATIVE NEGATIVE Final   Influenza B by PCR NEGATIVE NEGATIVE Final    Comment: (NOTE) The Xpert Xpress SARS-CoV-2/FLU/RSV plus assay is intended as an aid in the diagnosis of influenza from Nasopharyngeal swab specimens and should not be used as a sole basis for treatment. Nasal washings and aspirates are unacceptable for Xpert Xpress SARS-CoV-2/FLU/RSV testing.  Fact Sheet for Patients: EntrepreneurPulse.com.au  Fact Sheet for Healthcare Providers: IncredibleEmployment.be  This test is not yet approved or cleared by the [place] FDA and has been authorized for detection and/or diagnosis of SARS-CoV-2 by FDA under an Emergency Use Authorization (EUA). This EUA will remain in effect (meaning this test can be used) for the duration of the COVID-19 declaration under Section 564(b)(1) of the Act,  21 U.S.C. section 360bbb-3(b)(1), unless the authorization is terminated or revoked.  Performed at [facility], [address] [place], [place] [address]   Blood culture (routine x 2)     Status: None (Preliminary result)   Collection Time: 05/10/21  6:39 PM   Specimen: BLOOD  Result Value Ref Range Status   Specimen Description BLOOD  LEFT HAND  Final   Special Requests   Final    BOTTLES DRAWN AEROBIC ONLY Blood Culture adequate volume   Culture   Final    NO GROWTH 4 DAYS Performed at [facility], [address] [place], [place] [address]    Report Status PENDING  Incomplete  Culture, blood (Routine X 2) w Reflex to ID Panel     Status: None (Preliminary result)   Collection Time: 05/11/21  1:59 AM   Specimen: BLOOD  Result Value Ref Range Status   Specimen Description BLOOD RIGHT ASSIST CONTROL  Final   Special Requests   Final    BOTTLES DRAWN AEROBIC AND ANAEROBIC Blood Culture adequate volume   Culture   Final    NO GROWTH 3 DAYS Performed at [facility], [address] [place], [place] [address]    Report Status PENDING  Incomplete    Radiology Studies:  No results found.  Scheduled Meds:    levETIRAcetam  250 mg Per Tube BID   sodium chloride flush  3 mL Intravenous Q12H   topiramate  50 mg Per Tube BID    Continuous Infusions:    sodium chloride       LOS: 4 days     Vernell Leep, MD,  FACP, [facility], [facility], [facility] (Care Management Physician Certified) Welcome  To contact the attending provider between 7A-7P or the covering provider during after hours 7P-7A, please log into the web site [email or web address] and access using universal [place] password for that web site. If you do not have the password, please call the hospital operator.  05/14/2021, 1:48 PM

## 2021-05-14 NOTE — Progress Notes (Signed)
I met with the patient's daughters today at bedside.  We discussed the plan of care for the patient's foot.  We discussed the infection as well as PAD.  They do not wish to proceed with any further intervention and they want to place their father on hospice care. Podiatry to sign off.  If I can be of any further assistance please let me know. ? ?Celesta Gentile, DPM ?

## 2021-05-15 LAB — CULTURE, BLOOD (ROUTINE X 2)
Culture: NO GROWTH
Special Requests: ADEQUATE

## 2021-05-15 NOTE — Progress Notes (Signed)
PROGRESS NOTE   Clifford [name]  ALP:[id number]    DOB: 05-Sep-1950    DOA: 05/10/2021  PCP: Pcp, No   I have briefly reviewed patients previous medical records in [facility].  Chief Complaint  Patient presents with   femoral occlusion    Hospital Course:  71 year old male, SNF resident, medical history significant for HTN, pulmonary embolism on Eliquis, HLD, PAD, prediabetes, CVA with residual left hemiplegia, PEG tube dependent, nonverbal, sent from SNF due to abnormal USG that showed abnormal circulation.  In ED, dopplerable pulses in both feet.  Podiatry/Dr. Amalia Hailey consulted for suspected osteomyelitis of right first toe.  Vascular surgery consulted.  Unsuccessful attempt to do lower extremity angiogram.  Podiatry was planning right hallux amputation 3/11 but after d/w daughter, this was canceled because she was considering hospice.  On 3/11, patient's daughter after discussing with her siblings transitioned to full comfort care/hospice.  TOC consulted regarding returning to SNF with hospice but facility unfortunately unable to accept patient back until 3/13.  Now on full comfort care.   Assessment & Plan:  Principal Problem:   End of life care Active Problems:   Acute osteomyelitis of toe, right (HCC)   PAD (peripheral artery disease) (HCC)   AMS (altered mental status)   Stage 3a chronic kidney disease (CKD) (HCC)   Essential hypertension   Chronic diastolic congestive heart failure (HCC)   History of pulmonary embolism   Chronic anticoagulation   Hypernatremia   Lactic acidosis   Thrombocytopenia (HCC)   Elevated troponin level   Normocytic anemia   Failure to thrive in adult   Malnutrition of moderate degree   Pressure injury of skin   Junctional tachycardia (HCC)   Assessment and Plan: * End of life care After careful consideration, patient's daughter finally transitioned patient to full comfort care on 05/14/2021. As per detailed discussion with her, discontinued  all medications nonessential to comfort including tube feeds, labs and meds.  Antiseizure medications were continued to avoid discomfort if he has seizures. Unfortunately SNF unable to take patient until 3/13. Focused comfort care order set initiated. Prognosis possibly days to less than 2 weeks.  Appears comfortable and no concerns expressed by nursing.  Acute osteomyelitis of toe, right (HCC) Right hallux gangrene and osteomyelitis Podiatry/Dr. Amalia Hailey consultation appreciated Unable to complete MRI of lower extremity due to left sided contractures. As per podiatry recommendations, vascular surgery consulted to determine PAD status for any potential surgical intervention Suspects he will need at least a partial or total hallux amputation. As per vascular surgery, due to extreme left lower extremity contractures, unable to perform angiogram and recommend proceeding with surgery as needed.  Podiatry discussed with patient's daughter on 3/10 and surgery was canceled. As discussed with patient's daughter today, transitioned to comfort care, IV antibiotics discontinued.  PAD (peripheral artery disease) ([place]) As per triage notes, presented to the ED via EMS from [facility] for right femoral artery occlusion Imaging was done 3/4 but did not receive results until day of ED visit. Vascular surgery/Dr. Lucky Cowboy consultation appreciated.  Reportedly noninvasive imaging previously done was of left lower extremity which showed femoral occlusion but current gangrenous changes were of right first toe. As noted above, unable to perform angiogram due to severe left lower extremity contractures.  AMS (altered mental status) As per discussion with patient's brother on 3/8, up to 2 to 3 days PTA, patient reportedly was conversing with him and appropriately so.  The AMS seems to be subacute  or acute. Etiology unclear.?  Infectious.?  Suspect acute CVA.  CT head shows chronic right MCA infarct.  Hypodensity in the  right thalamus and splenium of the corpus callosum which was not identified on prior MRI 10/28/2020 and could be a new area of infarct.  No hemorrhage.  Patient unable to tolerate MRI due to left-sided contractures.  Repeat head CT on 3/8 showed chronic infarct. In discussing with patient's family, it appears that his mental status changes are acute compared to a few days ago while he was in SNF.  Even if this was an acute stroke, not sure there is much else to do at this time. Overall poor prognosis due to advanced age, debilitating CVA and multiple comorbidities and current acute illness.  This was discussed with daughter on multiple occasions. Earlier this morning, patient seemed to have sustained left gaze, tried 0.5 mg IV Ativan but made him more drowsy. Now comfort care.  Stage 3a chronic kidney disease (CKD) ([place]) Unclear if he actually has stage II CKD or stage IIIa. Creatinine normal.  Essential hypertension Controlled.  Chronic diastolic congestive heart failure (HCC) Compensated.  Chronic anticoagulation All anticoagulation now discontinued.  History of pulmonary embolism All anticoagulants discontinued.  Hypernatremia Serum sodium has actually increased from 146-1 50. Likely related to poor oral intake/inadequate free water through PEG tube Resolved. Tube feeds including free water stopped.  Lactic acidosis Suspected due to hypotension and poor tissue perfusion.  Serum lactate has fluctuated, peaked at 2.3 and down to 2.  Thrombocytopenia (HCC) Resolved  Elevated troponin level Creatinine elevated in the 120-130 range, flat trend.  No history of chest pain available.   ekg shows sr and mild twi in lateral leads.  Suspect demand ischemia.we will obtain echo.  Pt is also thrombocytopenic.  Last echo 8/22: LVEF 65-70%.  Normocytic anemia No overt bleeding. Transfuse if hemoglobin 7 g or less. Hemoglobin up to 9.6. No further lab draws.  Failure to thrive in  adult Extremely poor prognosis. Palliative care on board with ongoing [place] discussions with patient's daughter.  Malnutrition of moderate degree Nutrition Status: Nutrition Problem: Moderate Malnutrition Etiology: chronic illness (CVA) Signs/Symptoms: mild fat depletion, moderate fat depletion, mild muscle depletion, moderate muscle depletion Interventions: MVI, Prostat, Tube feeding   Discontinued tube feeds.  Pressure injury of skin Pressure Injury 05/11/21 Sacrum Mid Stage 2 -  Partial thickness loss of dermis presenting as a shallow open injury with a red, pink wound bed without slough. cluster of 3 stage 2s (Active)  05/11/21 0230  Location: Sacrum  Location Orientation: Mid  Staging: Stage 2 -  Partial thickness loss of dermis presenting as a shallow open injury with a red, pink wound bed without slough.  Wound Description (Comments): cluster of 3 stage 2s  Present on Admission: Yes     Pressure Injury 05/11/21 Coccyx Stage 1 -  Intact skin with non-blanchable redness of a localized area usually over a bony prominence. (Active)  05/11/21 0230  Location: Coccyx  Location Orientation:   Staging: Stage 1 -  Intact skin with non-blanchable redness of a localized area usually over a bony prominence.  Wound Description (Comments):   Present on Admission: Yes        Junctional tachycardia ([place]) Noted telemetry with accelerated junctional rhythm/junctional tachycardia at 120 bpm that was sustained for several hours yesterday evening Patient asymptomatic and does not appear in pain either. Back in sinus rhythm.  Discontinue telemetry.  Body mass index is 26.73 kg/m.  Nutritional  Status Nutrition Problem: Moderate Malnutrition Etiology: chronic illness (CVA) Signs/Symptoms: mild fat depletion, moderate fat depletion, mild muscle depletion, moderate muscle depletion Interventions: MVI, Prostat, Tube feeding  DVT prophylaxis:   None at this time due to comfort care.   Code  Status: DNR:  Family Communication: Daughter via phone Disposition:  Status is: Inpatient  Likely optimized for DC to SNF pending their ability to accept which is not until 3/13.    Consultants:   Podiatry Vascular surgery Palliative care medicine  Procedures:     Antimicrobials:   As above   Subjective:  Over the last 2 to 3 days, appears to have become progressively more somnolent, no eye-opening this morning.  Never had verbal response over the last several days of me taking care of him.  Objective:   Vitals:   05/14/21 0500 05/14/21 0727 05/14/21 1211 05/15/21 0722  BP:  133/62 (!) 142/68 123/71  Pulse:  91 80 97  Resp:  '20 20 20  '$ Temp:  98 F (36.7 C) 98.4 F (36.9 C) (!) 100.4 F (38 C)  TempSrc:   Oral Axillary  SpO2:  100% 92% 100%  Weight: 77.4 kg     Height:        General exam: Elderly male, moderately built and poorly nourished lying propped up in bed comfortably.  Chronically ill looking.  Does not appear in any kind of distress. Respiratory system: Poor inspiratory effort but no apneic spells noted.  Some mild breathing. Central nervous system: Unresponsive.  Did not attempt to arouse. Extremities: Left sided hemiparesis with contractures.  Left elbow/forearm flexed contracted across abdomen.  Left lower extremity contracted at the knee.  Chronic findings. Skin: As per RN, has sacral ulcer, bilateral heel ulcers.  Exam as per [place] RN. Psychiatry: Judgement and insight impaired. Mood & affect cannot be assessed.    Data Reviewed:   I have personally reviewed following labs and imaging studies   CBC: Recent Labs  Lab 05/10/21 1521 05/11/21 0507 05/12/21 0510 05/13/21 0522 05/14/21 0438  WBC 7.4   < > 6.0 5.5 6.0  NEUTROABS 5.1  --   --   --   --   HGB 10.2*   < > 8.8* 9.6* 9.1*  HCT 34.7*   < > 29.0* 31.8* 30.4*  MCV 86.8   < > 85.5 84.4 84.4  PLT 142*   < > 139* 157 150   < > = values in this interval not displayed.    Basic Metabolic  Panel: Recent Labs  Lab 05/10/21 1839 05/11/21 0507 05/11/21 1618 05/12/21 0510 05/12/21 1452 05/12/21 1646 05/13/21 0522 05/14/21 0438  NA 146* 150*  --  143  --   --  143  --   K 4.4 4.0  --  3.2*  --   --  3.5  --   CL 115* 122*  --  113*  --   --  117*  --   CO2 21* 24  --  20*  --   --  20*  --   GLUCOSE 106* 104*  --  108*  --   --  128*  --   BUN 43* 39*  --  36* 34*  --  29*  --   CREATININE 1.12 1.18  --  0.94 1.01  --  0.94 0.93  CALCIUM 9.1 9.0  --  8.7*  --   --  8.5*  --   MG 2.0  --  1.9 1.9  --  1.9 2.0  --   PHOS  --   --  3.5 3.4  --  2.9 2.5  --     Liver Function Tests: Recent Labs  Lab 05/10/21 1839  AST 36  ALT 28  ALKPHOS 60  BILITOT 0.8  PROT 7.0  ALBUMIN 2.9*    CBG: Recent Labs  Lab 05/14/21 0417 05/14/21 0730 05/14/21 1213  GLUCAP 109* 106* 92    Microbiology Studies:   Recent Results (from the past 240 hour(s))  Resp Panel by RT-PCR (Flu A&B, Covid) Nasopharyngeal Swab     Status: None   Collection Time: 05/10/21  3:21 PM   Specimen: Nasopharyngeal Swab; Nasopharyngeal(NP) swabs in vial transport medium  Result Value Ref Range Status   SARS Coronavirus 2 by RT PCR NEGATIVE NEGATIVE Final    Comment: (NOTE) SARS-CoV-2 target nucleic acids are NOT DETECTED.  The SARS-CoV-2 RNA is generally detectable in upper respiratory specimens during the acute phase of infection. The lowest concentration of SARS-CoV-2 viral copies this assay can detect is 138 copies/mL. A negative result does not preclude SARS-Cov-2 infection and should not be used as the sole basis for treatment or other patient management decisions. A negative result may occur with  improper specimen collection/handling, submission of specimen other than nasopharyngeal swab, presence of viral mutation(s) within the areas targeted by this assay, and inadequate number of viral copies(<138 copies/mL). A negative result must be combined with clinical observations, patient  history, and epidemiological information. The expected result is Negative.  Fact Sheet for Patients:  EntrepreneurPulse.com.au  Fact Sheet for Healthcare Providers:  IncredibleEmployment.be  This test is no t yet approved or cleared by the [place] FDA and  has been authorized for detection and/or diagnosis of SARS-CoV-2 by FDA under an Emergency Use Authorization (EUA). This EUA will remain  in effect (meaning this test can be used) for the duration of the COVID-19 declaration under Section 564(b)(1) of the Act, 21 U.S.C.section 360bbb-3(b)(1), unless the authorization is terminated  or revoked sooner.       Influenza A by PCR NEGATIVE NEGATIVE Final   Influenza B by PCR NEGATIVE NEGATIVE Final    Comment: (NOTE) The Xpert Xpress SARS-CoV-2/FLU/RSV plus assay is intended as an aid in the diagnosis of influenza from Nasopharyngeal swab specimens and should not be used as a sole basis for treatment. Nasal washings and aspirates are unacceptable for Xpert Xpress SARS-CoV-2/FLU/RSV testing.  Fact Sheet for Patients: EntrepreneurPulse.com.au  Fact Sheet for Healthcare Providers: IncredibleEmployment.be  This test is not yet approved or cleared by the [place] FDA and has been authorized for detection and/or diagnosis of SARS-CoV-2 by FDA under an Emergency Use Authorization (EUA). This EUA will remain in effect (meaning this test can be used) for the duration of the COVID-19 declaration under Section 564(b)(1) of the Act, 21 U.S.C. section 360bbb-3(b)(1), unless the authorization is terminated or revoked.  Performed at [facility], [address] [place], [place] [address]   Blood culture (routine x 2)     Status: None   Collection Time: 05/10/21  6:39 PM   Specimen: BLOOD  Result Value Ref Range Status   Specimen Description BLOOD LEFT HAND  Final   Special Requests   Final     BOTTLES DRAWN AEROBIC ONLY Blood Culture adequate volume   Culture   Final    NO GROWTH 5 DAYS Performed at [facility], [address], [place], [place] [address]    Report Status 05/15/2021  FINAL  Final  Culture, blood (Routine X 2) w Reflex to ID Panel     Status: None (Preliminary result)   Collection Time: 05/11/21  1:59 AM   Specimen: BLOOD  Result Value Ref Range Status   Specimen Description BLOOD RIGHT ASSIST CONTROL  Final   Special Requests   Final    BOTTLES DRAWN AEROBIC AND ANAEROBIC Blood Culture adequate volume   Culture   Final    NO GROWTH 4 DAYS Performed at [facility], [address] [place], [place], [place] [address]    Report Status PENDING  Incomplete    Radiology Studies:  No results found.  Scheduled Meds:    levETIRAcetam  250 mg Per Tube BID   sodium chloride flush  3 mL Intravenous Q12H   topiramate  50 mg Per Tube BID    Continuous Infusions:    sodium chloride       LOS: 5 days     Vernell Leep, MD,  FACP, [facility], [facility], [facility] (Care Management Physician Certified) Alliance  To contact the attending provider between 7A-7P or the covering provider during after hours 7P-7A, please log into the web site [email or web address] and access using universal [place] password for that web site. If you do not have the password, please call the hospital operator.  05/15/2021, 10:54 AM

## 2021-05-15 NOTE — TOC Progression Note (Signed)
Transition of Care (TOC) - Progression Note  ? ? ?Patient Details  ?Name: Clifford [name] ?MRN: [id number] ?Date of Birth: Dec 21, 1950 ? ?Transition of Care (TOC) CM/SW Contact  ?Reese Stockman E Thera Basden, LCSW ?Phone Number: ?05/15/2021, 8:59 AM ? ?Clinical Narrative:   CSW attempted to reach out to Adriane at [facility] again about DC to [facility] with hospice tomorrow, waiting on response. ?Left handoff for weekday TOC to follow up on DC to [facility] tomorrow with hospice care.  ? ? ?Expected Discharge Plan: [place] ?Barriers to Discharge: Barriers Resolved ? ?Expected Discharge Plan and Services ?Expected Discharge Plan: [place] ?In-house Referral: Clinical Social Work ?  ?Post Acute Care Choice: [place] ?Living arrangements for the past 2 months: [place] ?                ?  ?  ?  ?  ?  ?  ?  ?  ?  ?  ? ? ?Social Determinants of Health (SDOH) Interventions ?  ? ?Readmission Risk Interventions ?Readmission Risk Prevention Plan 12/14/2020  ?Transportation Screening Complete  ?PCP or Specialist Appt within 3-5 Days Complete  ?[place] or Home Care Consult Complete  ?Social Work Consult for [place] Planning/Counseling Complete  ?Palliative Care Screening Complete  ?Medication Review Press photographer) Complete  ? ? ?

## 2021-05-16 LAB — CULTURE, BLOOD (ROUTINE X 2)
Culture: NO GROWTH
Special Requests: ADEQUATE

## 2021-05-16 MED ORDER — MORPHINE SULFATE (CONCENTRATE) 10 MG/0.5ML PO SOLN: 5.0000 mg | Freq: Four times a day (QID) | ORAL | 0 refills | Status: AC | PRN

## 2021-05-16 MED ORDER — LORAZEPAM 2 MG/ML PO CONC: 1.0000 mg | Freq: Three times a day (TID) | ORAL | 0 refills | Status: AC | PRN

## 2021-05-16 NOTE — Progress Notes (Signed)
Patient being discharged to [facility] with Hospice Care. Ivs removed. All paper work and DNR in discharge packet. Patient being transported via EMS. ?

## 2021-05-16 NOTE — Progress Notes (Signed)
Palliative: ?Mr. Boehme is lying quietly in bed.  He appears acutely/chronically ill and frail.  He will make an somewhat keep eye contact.  He does not attempt to speak with me today.  I do not believe that he can make his basic needs known.  There is no family at bedside at this time.  Mr. Mcbane family has elected comfort and dignity, returning to [facility] under long-term care with hospice care. ? ?Conference with attending, bedside nursing staff, transition of care team related to patient condition, needs, goals of care, disposition. ?Symptom management needs discussed with bedside nursing staff. ?DNR/[place]enrod form is on chart. ? ?Plan:    Return to [facility] under long-term care with [facility]. ? ?35 minutes  ?[name], NP ?Palliative medicine team ?Team phone [phone] ?Greater than 50% of this time was spent counseling and coordinating care related to the above assessment and plan. ? ?

## 2021-05-16 NOTE — Discharge Summary (Signed)
Physician Discharge Summary  WASHINGTON WHEDBEE KNL:[id number] DOB: Mar 30, 1950  PCP: Pcp, No  Admitted from: SNF Discharged to: SNF with hospice  Admit date: 05/10/2021 Discharge date: 05/16/2021  Recommendations for Outpatient Follow-up:    Follow-up Information     MD at SNF. Schedule an appointment as soon as possible for a visit.   Why: To be seen within 2 to 3 days of hospital discharge.  Patient is discharging to SNF on hospice.  Consult to hospice team.                 Home Health: None    Equipment/Devices: None    Discharge Condition: Extremely poor prognosis.  Expected to decline and eventual demise.  Patient on full comfort care.   Code Status: DNR Diet recommendation: NPO.  No tube feeds either.  Only meds via PEG tube.    Discharge Diagnoses:  Principal Problem:   End of life care Active Problems:   Acute osteomyelitis of toe, right (HCC)   PAD (peripheral artery disease) ([place])   AMS (altered mental status)   Stage 3a chronic kidney disease (CKD) (HCC)   Essential hypertension   Chronic diastolic congestive heart failure (HCC)   History of pulmonary embolism   Chronic anticoagulation   Hypernatremia   Lactic acidosis   Thrombocytopenia (HCC)   Elevated troponin level   Normocytic anemia   Failure to thrive in adult   Malnutrition of moderate degree   Pressure injury of skin   Junctional tachycardia (HCC)   Brief Summary: 71 year old male, SNF resident, medical history significant for HTN, pulmonary embolism on Eliquis, HLD, PAD, prediabetes, CVA with residual left hemiplegia, PEG tube dependent, nonverbal, sent from SNF due to abnormal USG that showed abnormal circulation.  In ED, dopplerable pulses in both feet.  Podiatry/Dr. Amalia Hailey consulted for suspected osteomyelitis of right first toe.  Vascular surgery consulted.  Unsuccessful attempt to do lower extremity angiogram.  Podiatry was planning right hallux amputation 3/11 but after d/w daughter, this  was canceled because she was considering hospice.  On 3/11, patient's daughter after discussing with her siblings transitioned to full comfort care/hospice.  DC to SNF with hospice.  TOC to coordinate.  Assessment and Plan: * End of life care After careful consideration, patient's daughter finally transitioned patient to full comfort care on 05/14/2021. As per detailed discussion with her, discontinued all medications nonessential to comfort including tube feeds, labs and meds.  Antiseizure medications were continued to avoid discomfort if he has seizures. Unfortunately SNF unable to take patient until 3/13. Focused comfort care order set initiated. Prognosis possibly days to less than 2 weeks.  Appears comfortable and no concerns expressed by nursing.  Acute osteomyelitis of toe, right (HCC) Right hallux gangrene and osteomyelitis Podiatry/Dr. Amalia Hailey consultation appreciated Unable to complete MRI of lower extremity due to left sided contractures. As per podiatry recommendations, vascular surgery consulted to determine PAD status for any potential surgical intervention Suspects he will need at least a partial or total hallux amputation. As per vascular surgery, due to extreme left lower extremity contractures, unable to perform angiogram and recommend proceeding with surgery as needed.  Podiatry discussed with patient's daughter on 3/10 and surgery was canceled. As discussed with patient's daughter today, transitioned to comfort care, IV antibiotics discontinued.  PAD (peripheral artery disease) ([place]) As per triage notes, presented to the ED via EMS from [facility] for right femoral artery occlusion Imaging was done 3/4 but did not receive results until day  of ED visit. Vascular surgery/Dr. Lucky Cowboy consultation appreciated.  Reportedly noninvasive imaging previously done was of left lower extremity which showed femoral occlusion but current gangrenous changes were of right first toe. As  noted above, unable to perform angiogram due to severe left lower extremity contractures.  AMS (altered mental status) As per discussion with patient's brother on 3/8, up to 2 to 3 days PTA, patient reportedly was conversing with him and appropriately so.  The AMS seems to be subacute or acute. Etiology unclear.?  Infectious.?  Suspect acute CVA.  CT head shows chronic right MCA infarct.  Hypodensity in the right thalamus and splenium of the corpus callosum which was not identified on prior MRI 10/28/2020 and could be a new area of infarct.  No hemorrhage.  Patient unable to tolerate MRI due to left-sided contractures.  Repeat head CT on 3/8 showed chronic infarct. In discussing with patient's family, it appears that his mental status changes are acute compared to a few days ago while he was in SNF.  Even if this was an acute stroke, not sure there is much else to do at this time. Overall poor prognosis due to advanced age, debilitating CVA and multiple comorbidities and current acute illness.  This was discussed with daughter on multiple occasions. Now comfort care.  Stage 3a chronic kidney disease (CKD) ([place]) Unclear if he actually has stage II CKD or stage IIIa. Creatinine normal.  Essential hypertension Controlled.  Chronic diastolic congestive heart failure (HCC) Compensated.  Chronic anticoagulation All anticoagulation now discontinued.  History of pulmonary embolism All anticoagulants discontinued.  Hypernatremia Serum sodium has actually increased from 146-1 50. Likely related to poor oral intake/inadequate free water through PEG tube Resolved. Tube feeds including free water stopped.  Lactic acidosis Suspected due to hypotension and poor tissue perfusion.  Serum lactate has fluctuated, peaked at 2.3 and down to 2.  Thrombocytopenia (HCC) Resolved  Elevated troponin level Creatinine elevated in the 120-130 range, flat trend.  No history of chest pain available.   ekg  shows sr and mild twi in lateral leads.  Suspect demand ischemia.we will obtain echo.  Pt is also thrombocytopenic.  Last echo 8/22: LVEF 65-70%.  Normocytic anemia No overt bleeding. Transfuse if hemoglobin 7 g or less. Hemoglobin up to 9.6. No further lab draws.  Failure to thrive in adult Extremely poor prognosis. Palliative care on board with ongoing [place] discussions with patient's daughter.  Malnutrition of moderate degree Nutrition Status: Nutrition Problem: Moderate Malnutrition Etiology: chronic illness (CVA) Signs/Symptoms: mild fat depletion, moderate fat depletion, mild muscle depletion, moderate muscle depletion Interventions: MVI, Prostat, Tube feeding   Discontinued tube feeds.  Pressure injury of skin Pressure Injury 05/11/21 Sacrum Mid Stage 2 -  Partial thickness loss of dermis presenting as a shallow open injury with a red, pink wound bed without slough. cluster of 3 stage 2s (Active)  05/11/21 0230  Location: Sacrum  Location Orientation: Mid  Staging: Stage 2 -  Partial thickness loss of dermis presenting as a shallow open injury with a red, pink wound bed without slough.  Wound Description (Comments): cluster of 3 stage 2s  Present on Admission: Yes     Pressure Injury 05/11/21 Coccyx Stage 1 -  Intact skin with non-blanchable redness of a localized area usually over a bony prominence. (Active)  05/11/21 0230  Location: Coccyx  Location Orientation:   Staging: Stage 1 -  Intact skin with non-blanchable redness of a localized area usually over a bony prominence.  Wound Description (Comments):   Present on Admission: Yes        Junctional tachycardia ([place]) Noted telemetry with accelerated junctional rhythm/junctional tachycardia at 120 bpm that was sustained for several hours yesterday evening Patient asymptomatic and does not appear in pain either. Back in sinus rhythm.  Discontinue telemetry.       Body mass index is 26.73  kg/m.  Nutritional Status Nutrition Problem: Moderate Malnutrition Etiology: chronic illness (CVA) Signs/Symptoms: mild fat depletion, moderate fat depletion, mild muscle depletion, moderate muscle depletion Interventions: MVI, Prostat, Tube feeding  Pressure Ulcer:    Consultations: Podiatry Vascular surgery Palliative care medicine  Procedures: None   Discharge Instructions  Discharge Instructions     Bed rest   Complete by: As directed    Call MD for:  difficulty breathing, headache or visual disturbances   Complete by: As directed    Call MD for:  extreme fatigue   Complete by: As directed    Call MD for:  persistant dizziness or light-headedness   Complete by: As directed    Call MD for:  persistant nausea and vomiting   Complete by: As directed    Call MD for:  severe uncontrolled pain   Complete by: As directed    Call MD for:  temperature >100.4   Complete by: As directed    Discharge instructions   Complete by: As directed    NPO and no tube feeds. Only meds via tube.   Discharge wound care:   Complete by: As directed    As per SNF protocol.        Medication List     STOP taking these medications    acetaminophen 160 MG/5ML solution Commonly known as: TYLENOL   acetaminophen 500 MG tablet Commonly known as: TYLENOL   amLODipine 10 MG tablet Commonly known as: NORVASC   amLODipine 2.5 MG tablet Commonly known as: NORVASC   amLODipine 5 MG tablet Commonly known as: NORVASC   apixaban 5 MG Tabs tablet Commonly known as: ELIQUIS   atorvastatin 40 MG tablet Commonly known as: LIPITOR   carvedilol 12.5 MG tablet Commonly known as: COREG   cyclobenzaprine 5 MG tablet Commonly known as: FLEXERIL   diclofenac Sodium 1 % Gel Commonly known as: VOLTAREN   famotidine 40 MG/5ML suspension Commonly known as: PEPCID   insulin aspart 100 UNIT/ML injection Commonly known as: novoLOG   metaxalone 800 MG tablet Commonly known as:  SKELAXIN   modafinil 100 MG tablet Commonly known as: PROVIGIL   pantoprazole sodium 40 mg Commonly known as: PROTONIX   polyethylene glycol 17 g packet Commonly known as: MIRALAX / GLYCOLAX   QUEtiapine 25 MG tablet Commonly known as: SEROQUEL   saccharomyces boulardii 250 MG capsule Commonly known as: FLORASTOR   traMADol 50 MG tablet Commonly known as: ULTRAM       TAKE these medications    levETIRAcetam 100 MG/ML solution Commonly known as: KEPPRA Place 2.5 mLs (250 mg total) into feeding tube 2 (two) times daily.   LORazepam 2 MG/ML concentrated solution Commonly known as: ATIVAN Place 0.5 mLs (1 mg total) under the tongue every 8 (eight) hours as needed for anxiety or seizure.   morphine CONCENTRATE 10 MG/0.5ML Soln concentrated solution Place 0.25 mLs (5 mg total) under the tongue every 6 (six) hours as needed for moderate pain, severe pain or shortness of breath.   topiramate 50 MG tablet Commonly known as: TOPAMAX Place 1 tablet (50 mg total) into feeding  tube 2 (two) times daily.       Allergies  Allergen Reactions   Apixaban     Other reaction(s): Liver enzymes abnormal   Lisinopril     Other reaction(s): Cough (finding)   Simvastatin     Other reaction(s): Pain in lower limb (finding)      Procedures/Studies: CT ANGIO HEAD NECK W WO CM  Result Date: 05/11/2021 CLINICAL DATA:  Initial evaluation for neuro deficit, stroke suspected. EXAM: CT ANGIOGRAPHY HEAD AND NECK TECHNIQUE: Multidetector CT imaging of the head and neck was performed using the standard protocol during bolus administration of intravenous contrast. Multiplanar CT image reconstructions and MIPs were obtained to evaluate the vascular anatomy. Carotid stenosis measurements (when applicable) are obtained utilizing NASCET criteria, using the distal internal carotid diameter as the denominator. RADIATION DOSE REDUCTION: This exam was performed according to the departmental  dose-optimization program which includes automated exposure control, adjustment of the mA and/or kV according to patient size and/or use of iterative reconstruction technique. CONTRAST:  54m OMNIPAQUE IOHEXOL 350 MG/ML SOLN COMPARISON:  Comparison made with prior head CT from earlier the same day. FINDINGS: CTA NECK FINDINGS Aortic arch: Visualized aortic arch normal caliber with normal branch pattern. Mild atheromatous change about the arch itself. No hemodynamically significant stenosis about the origin of the great vessels. Right carotid system: Right CCA tortuous but widely patent to the bifurcation. There is abrupt occlusion of the proximal cervical right ICA just distal to the bifurcation, likely chronic in nature given the large chronic right cerebral infarct seen on prior CT. Right ICA remains occluded to the skull base. Left carotid system: Left common and internal carotid arteries are somewhat tortuous but widely patent without stenosis or dissection. Vertebral arteries: Both vertebral arteries arise from the subclavian arteries. No proximal subclavian artery stenosis. Proximal left vertebral artery not well seen due to adjacent venous contamination. Visualized portions of the vertebral arteries patent without stenosis or dissection. Skeleton: Visualized osseous structures are somewhat heterogeneous without definite discrete lytic or blastic osseous lesion. Mild-to-moderate multilevel cervical spondylosis without high-grade spinal stenosis. Patient is edentulous. Other neck: No other acute soft tissue abnormality within the neck. 6 mm right thyroid nodule noted, of doubtful significance given size and patient age, no follow-up imaging recommended (ref: J Am Coll Radiol. 2015 Feb;12(2): 143-50). Upper chest: Visualized upper chest demonstrates no acute finding. Review of the MIP images confirms the above findings CTA HEAD FINDINGS Anterior circulation: Petrous left ICA widely patent. Atheromatous change  within the left carotid siphon without hemodynamically significant stenosis. Right ICA remains occluded to the terminus. A1 segments patent bilaterally. Right A1 hypoplastic. Normal anterior communicating artery complex. Both anterior cerebral arteries widely patent to their distal aspects. Left M1 widely patent. Normal left MCA bifurcation. Distal left MCA branches widely patent. Perfusion of the right M1 segment via collateral flow across the circle-of-Willis. Right M1 is somewhat diminutive with probable short-segment moderate stenosis at its distal aspect (series 9, image 53). Normal right MCA bifurcation. Right MCA branches attenuated but grossly patent distally. Posterior circulation: Both vertebral arteries patent without hemodynamically significant stenosis. Both PICA patent. Basilar patent to its distal aspect without stenosis. Superior cerebellar and posterior cerebral arteries widely patent bilaterally. Venous sinuses: Patent allowing for timing the contrast bolus. Anatomic variants: None significant.  No aneurysm. Review of the MIP images confirms the above findings IMPRESSION: 1. 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 Right ICA remains occluded to the terminus. Right MCA and  its branches are attenuated but perfused via collateral flow across the circle-of-Willis. 2. Mild atheromatous change about the left carotid bifurcation and left carotid siphon, but no hemodynamically significant or correctable stenosis. 3. Diffuse tortuosity of the major arterial vasculature of the head and neck, suggesting chronic underlying hypertension. Electronically Signed   By: Jeannine Boga M.D.   On: 05/11/2021 00:11   CT HEAD WO CONTRAST (5MM)  Result Date: 05/11/2021 CLINICAL DATA:  Stroke follow-up EXAM: CT HEAD WITHOUT CONTRAST TECHNIQUE: Contiguous axial images were obtained from the base of the skull through the vertex without intravenous contrast. RADIATION DOSE REDUCTION:  This exam was performed according to the departmental dose-optimization program which includes automated exposure control, adjustment of the mA and/or kV according to patient size and/or use of iterative reconstruction technique. COMPARISON:  CT head 05/10/2021 FINDINGS: Brain: Chronic right MCA infarct involving the ref right frontal parietal and temporal lobe. Largest area is in the right parietal lobe. Dystrophic calcification in the infarct. Hypodensity in the right thalamus and splenium unchanged. No change from yesterday. Negative for acute infarct, hemorrhage, mass. Mild enlargement of the right atrium due to volume loss from prior infarction. Vascular: Negative for hyperdense vessel Skull: Negative Sinuses/Orbits: Mild mucosal edema paranasal sinuses. Negative orbit Other: None IMPRESSION: Chronic right MCA infarct. Hypodensity right thalamus and splenium unchanged possibly areas of chronic infarct as well. No change from yesterday. Electronically Signed   By: Franchot Gallo M.D.   On: 05/11/2021 16:02   CT HEAD WO CONTRAST (5MM)  Result Date: 05/10/2021 CLINICAL DATA:  Mental status change.  History of stroke EXAM: CT HEAD WITHOUT CONTRAST TECHNIQUE: Contiguous axial images were obtained from the base of the skull through the vertex without intravenous contrast. RADIATION DOSE REDUCTION: This exam was performed according to the departmental dose-optimization program which includes automated exposure control, adjustment of the mA and/or kV according to patient size and/or use of iterative reconstruction technique. COMPARISON:  CT head 11/16/2020 FINDINGS: Brain: Encephalomalacia in the for right frontal parietal and temporal lobe compatible with chronic infarct. Dystrophic calcification in the infarct has developed since the prior study. There is also chronic infarct involving the right occipital lobe. Territory similar to the prior study. New area of hypodensity right thalamus and right splenium.  Negative for acute   hemorrhage, mass.  Negative for hydrocephalus. Vascular: Negative for hyperdense vessel Skull: No focal abnormality Sinuses/Orbits: Paranasal sinuses clear.  Negative orbit Other: None IMPRESSION: Chronic right MCA infarct. Hypodensity in the right thalamus and splenium of the corpus callosum. This was not identified on the prior MRI 10/28/2020 and could be a new area of infarct. Negative for hemorrhage. Electronically Signed   By: Franchot Gallo M.D.   On: 05/10/2021 15:39   PERIPHERAL VASCULAR CATHETERIZATION  Result Date: 05/12/2021 See surgical note for result.  DG Foot Complete Right  Result Date: 05/10/2021 CLINICAL DATA:  Concern for osteomyelitis of the right great toe EXAM: RIGHT FOOT COMPLETE - 3+ VIEW COMPARISON:  None. FINDINGS: Erosive changes along the lateral margin of the tuft of the great toe distal phalanx compatible with acute osteomyelitis. No additional sites of focal bone erosion are seen. No fracture or dislocation. Degenerative changes are most pronounced at the first and second MTP joints. There is soft tissue swelling about the foot. IMPRESSION: Acute osteomyelitis of the right great toe distal phalanx. Electronically Signed   By: Davina Poke D.O.   On: 05/10/2021 15:29      Subjective: Patient is nonverbal.  Noncommunicative.  Does not appear in any pain or distress.  As per RN, no acute issues noted.  Discharge Exam:  Vitals:   05/15/21 0722 05/15/21 1950 05/16/21 0515 05/16/21 0739  BP: 123/71 125/69 140/89 (!) 155/64  Pulse: 97 82 87 73  Resp: '20 18 17 15  '$ Temp: (!) 100.4 F (38 C) 97.8 F (36.6 C) 98 F (36.7 C) 98.1 F (36.7 C)  TempSrc: Axillary     SpO2: 100% 100% 100% 100%  Weight:      Height:        General exam: Elderly male, moderately built and poorly nourished lying propped up in bed comfortably.  Chronically ill looking.  Does not appear in any kind of distress. Respiratory system: Poor inspiratory effort but no  apneic spells noted.  Some mild breathing. Central nervous system: Unresponsive.  Did not attempt to arouse. Extremities: Left sided hemiparesis with contractures.  Left elbow/forearm flexed contracted across abdomen.  Left lower extremity contracted at the knee.  Chronic findings. Skin: As per RN, has sacral ulcer, bilateral heel ulcers.  Exam as per [place] RN. Psychiatry: Judgement and insight impaired. Mood & affect cannot be assessed.    The results of significant diagnostics from this hospitalization (including imaging, microbiology, ancillary and laboratory) are listed below for reference.     Microbiology: Recent Results (from the past 240 hour(s))  Resp Panel by RT-PCR (Flu A&B, Covid) Nasopharyngeal Swab     Status: None   Collection Time: 05/10/21  3:21 PM   Specimen: Nasopharyngeal Swab; Nasopharyngeal(NP) swabs in vial transport medium  Result Value Ref Range Status   SARS Coronavirus 2 by RT PCR NEGATIVE NEGATIVE Final    Comment: (NOTE) SARS-CoV-2 target nucleic acids are NOT DETECTED.  The SARS-CoV-2 RNA is generally detectable in upper respiratory specimens during the acute phase of infection. The lowest concentration of SARS-CoV-2 viral copies this assay can detect is 138 copies/mL. A negative result does not preclude SARS-Cov-2 infection and should not be used as the sole basis for treatment or other patient management decisions. A negative result may occur with  improper specimen collection/handling, submission of specimen other than nasopharyngeal swab, presence of viral mutation(s) within the areas targeted by this assay, and inadequate number of viral copies(<138 copies/mL). A negative result must be combined with clinical observations, patient history, and epidemiological information. The expected result is Negative.  Fact Sheet for Patients:  EntrepreneurPulse.com.au  Fact Sheet for Healthcare Providers:   IncredibleEmployment.be  This test is no t yet approved or cleared by the [place] FDA and  has been authorized for detection and/or diagnosis of SARS-CoV-2 by FDA under an Emergency Use Authorization (EUA). This EUA will remain  in effect (meaning this test can be used) for the duration of the COVID-19 declaration under Section 564(b)(1) of the Act, 21 U.S.C.section 360bbb-3(b)(1), unless the authorization is terminated  or revoked sooner.       Influenza A by PCR NEGATIVE NEGATIVE Final   Influenza B by PCR NEGATIVE NEGATIVE Final    Comment: (NOTE) The Xpert Xpress SARS-CoV-2/FLU/RSV plus assay is intended as an aid in the diagnosis of influenza from Nasopharyngeal swab specimens and should not be used as a sole basis for treatment. Nasal washings and aspirates are unacceptable for Xpert Xpress SARS-CoV-2/FLU/RSV testing.  Fact Sheet for Patients: EntrepreneurPulse.com.au  Fact Sheet for Healthcare Providers: IncredibleEmployment.be  This test is not yet approved or cleared by the [place] FDA and has been authorized for detection and/or  diagnosis of SARS-CoV-2 by FDA under an Emergency Use Authorization (EUA). This EUA will remain in effect (meaning this test can be used) for the duration of the COVID-19 declaration under Section 564(b)(1) of the Act, 21 U.S.C. section 360bbb-3(b)(1), unless the authorization is terminated or revoked.  Performed at [facility], [address] [place], [place] [address]   Blood culture (routine x 2)     Status: None   Collection Time: 05/10/21  6:39 PM   Specimen: BLOOD  Result Value Ref Range Status   Specimen Description BLOOD LEFT HAND  Final   Special Requests   Final    BOTTLES DRAWN AEROBIC ONLY Blood Culture adequate volume   Culture   Final    NO GROWTH 5 DAYS Performed at [facility], [address] [place], [place] [address]    Report  Status 05/15/2021 FINAL  Final  Culture, blood (Routine X 2) w Reflex to ID Panel     Status: None   Collection Time: 05/11/21  1:59 AM   Specimen: BLOOD  Result Value Ref Range Status   Specimen Description BLOOD RIGHT ASSIST CONTROL  Final   Special Requests   Final    BOTTLES DRAWN AEROBIC AND ANAEROBIC Blood Culture adequate volume   Culture   Final    NO GROWTH 5 DAYS Performed at [facility], [address] [place],  [address]    Report Status 05/16/2021 FINAL  Final     Labs: CBC: Recent Labs  Lab 05/10/21 1521 05/11/21 0507 05/12/21 0510 05/13/21 0522 05/14/21 0438  WBC 7.4 8.5 6.0 5.5 6.0  NEUTROABS 5.1  --   --   --   --   HGB 10.2* 8.9* 8.8* 9.6* 9.1*  HCT 34.7* 30.2* 29.0* 31.8* 30.4*  MCV 86.8 86.0 85.5 84.4 84.4  PLT 142* 128* 139* 157 989    Basic Metabolic Panel: Recent Labs  Lab 05/10/21 1839 05/11/21 0507 05/11/21 1618 05/12/21 0510 05/12/21 1452 05/12/21 1646 05/13/21 0522 05/14/21 0438  NA 146* 150*  --  143  --   --  143  --   K 4.4 4.0  --  3.2*  --   --  3.5  --   CL 115* 122*  --  113*  --   --  117*  --   CO2 21* 24  --  20*  --   --  20*  --   GLUCOSE 106* 104*  --  108*  --   --  128*  --   BUN 43* 39*  --  36* 34*  --  29*  --   CREATININE 1.12 1.18  --  0.94 1.01  --  0.94 0.93  CALCIUM 9.1 9.0  --  8.7*  --   --  8.5*  --   MG 2.0  --  1.9 1.9  --  1.9 2.0  --   PHOS  --   --  3.5 3.4  --  2.9 2.5  --     Liver Function Tests: Recent Labs  Lab 05/10/21 1839  AST 36  ALT 28  ALKPHOS 60  BILITOT 0.8  PROT 7.0  ALBUMIN 2.9*    CBG: Recent Labs  Lab 05/13/21 2034 05/14/21 0021 05/14/21 0417 05/14/21 0730 05/14/21 1213  GLUCAP 108* 109* 109* 106* 92   Urinalysis    Component Value Date/Time   COLORURINE YELLOW 11/13/2020 0824   APPEARANCEUR CLEAR 11/13/2020 0824   LABSPEC 1.015 11/13/2020  [place] 6.5 11/13/2020 0824   GLUCOSEU NEGATIVE 11/13/2020 0824   HGBUR NEGATIVE 11/13/2020  0824   [place] 11/13/2020 0824   [place] 11/13/2020 0824   PROTEINUR NEGATIVE 11/13/2020 0824   NITRITE NEGATIVE 11/13/2020 0824   LEUKOCYTESUR NEGATIVE 11/13/2020 0824      Time coordinating discharge: 35 minutes  SIGNED:  Vernell Leep, MD,  FACP, [facility], [facility], [facility] (Care Management Physician Certified). [facility]  To contact the attending provider between 7A-7P or the covering provider during after hours 7P-7A, please log into the web site [email or web address] and access using universal [place] password for that web site. If you do not have the password, please call the hospital operator.

## 2021-05-16 NOTE — TOC Transition Note (Signed)
Transition of Care (TOC) - CM/SW Discharge Note ? ? ?Patient Details  ?Name: Clifford [name] ?MRN: [id number] ?Date of Birth: 1950-08-27 ? ?Transition of Care (TOC) CM/SW Contact:  ?Consetta Cosner A Johnthomas Lader, LCSW ?Phone Number: ?05/16/2021, 12:42 PM ? ? ?Clinical Narrative:  Clinical Social Worker facilitated patient discharge including contacting patient family and facility to confirm patient discharge plans.  Clinical information faxed to facility and family agreeable with plan.  CSW arranged ambulance transport via [facility] to [facility] room 212.  RN to call [phone] for report prior to discharge. ? ?  ? ? ? ?Final next level of care: [place] ?Barriers to Discharge: No Barriers Identified ? ? ?Patient Goals and CMS Choice ?Patient states their goals for this hospitalization and ongoing recovery are:: back to [facility] with hospice ?CMS Medicare.gov Compare Post Acute Care list provided to:: Patient Represenative (must comment) ?Choice offered to / list presented to : Adult Children ? ?Discharge Placement ?  ?           ?Patient chooses bed at:  ([facility]) ?Patient to be transferred to facility by: [facility] ?Name of family member notified: Catrina-daughter ?Patient and family notified of of transfer: 05/16/21 ? ?Discharge Plan and Services ?In-house Referral: Clinical Social Work ?  ?Post Acute Care Choice: [place]          ?  ?  ?  ?  ?  ?  ?  ?  ?  ?  ? ?Social Determinants of Health (SDOH) Interventions ?  ? ? ?Readmission Risk Interventions ?Readmission Risk Prevention Plan 12/14/2020  ?Transportation Screening Complete  ?PCP or Specialist Appt within 3-5 Days Complete  ?[place] or Home Care Consult Complete  ?Social Work Consult for [place] Planning/Counseling Complete  ?Palliative Care Screening Complete  ?Medication Review Press photographer) Complete  ? ? ? ? ? ?

## 2021-05-16 NOTE — Progress Notes (Signed)
Nutrition Brief Note  Chart reviewed. Pt now transitioning to comfort care.  No further nutrition interventions planned at this time.  Please re-consult as needed.   Iysha Mishkin W, RD, LDN, CDCES Registered Dietitian II Certified Diabetes Care and Education Specialist Please refer to AMION for RD and/or RD on-call/weekend/after hours pager   

## 2021-06-04 DEATH — deceased

## 2021-07-04 DEATH — deceased

## 2021-07-07 ENCOUNTER — Encounter: Payer: Self-pay | Admitting: Adult Health

## 2021-07-07 ENCOUNTER — Ambulatory Visit: Payer: Medicare Other | Admitting: Adult Health

## 2022-06-06 IMAGING — CT CT HEAD W/O CM
4 series · 18 of 30 positions shown, 19 images · non-contrast
Comparison: 11/01/2020

CLINICAL DATA: Change in mental status with unknown cause

EXAM:
CT HEAD WITHOUT CONTRAST
TECHNIQUE: Contiguous axial images were obtained from the base of the skull
through the vertex without intravenous contrast.

[Series 2: head without · axial · non-contrast · 0.47mm/px · z∈[-32,+33]mm · 2 of 39 slices shown, 3 images]
[im 13/39  brain]
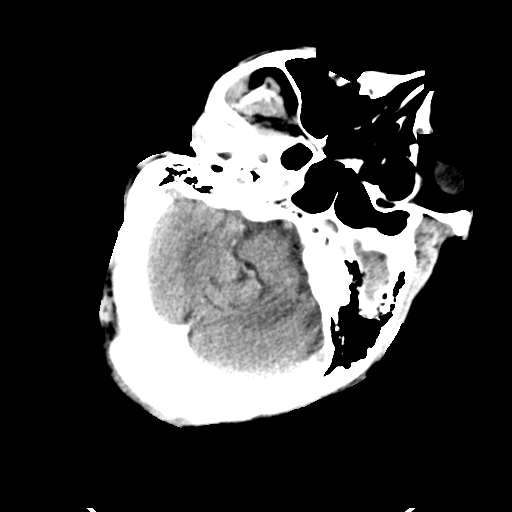
[im 13/39  bone]
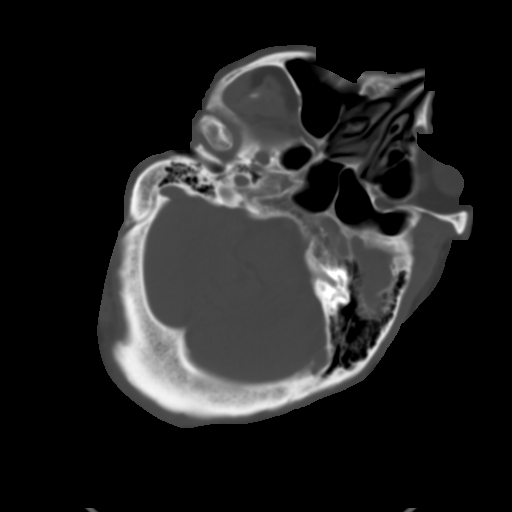
[im 26/39  brain]
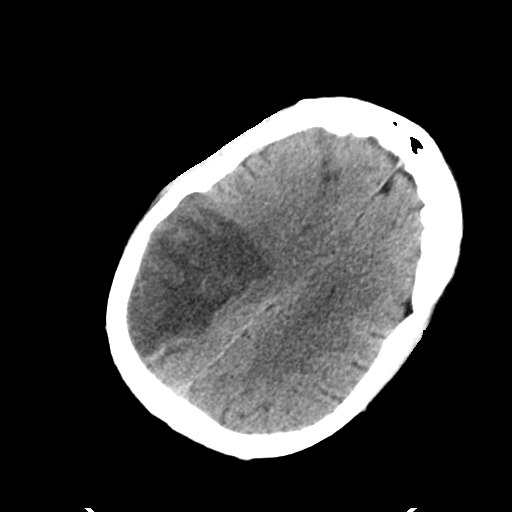

[Series 3: head bone · axial · 0.47mm/px · z∈[-74,+82]mm · 8 of 98 slices shown]
[im 10/98  bone]
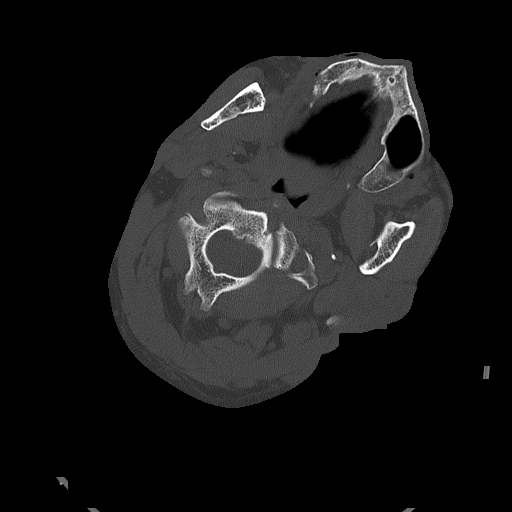
[im 20/98  bone]
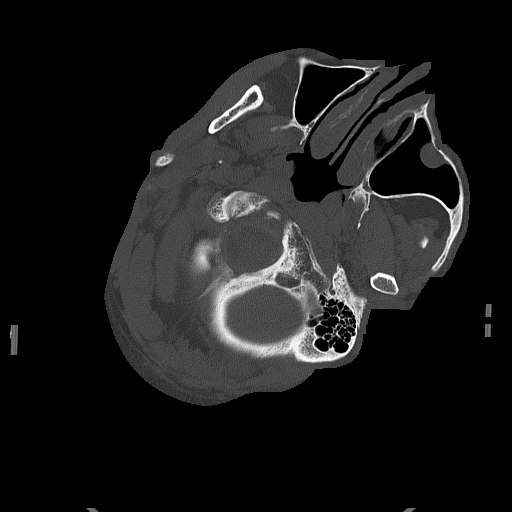
[im 30/98  bone]
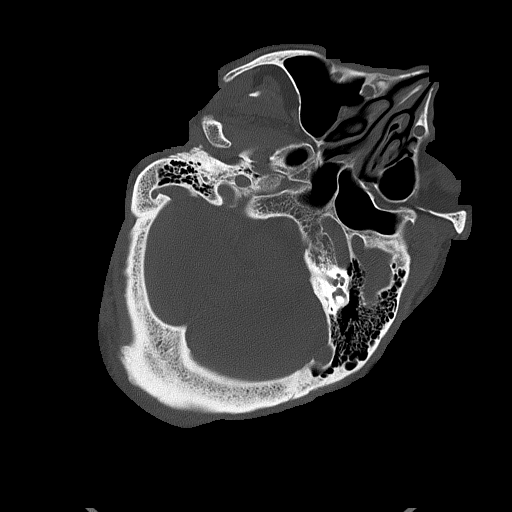
[im 39/98  bone]
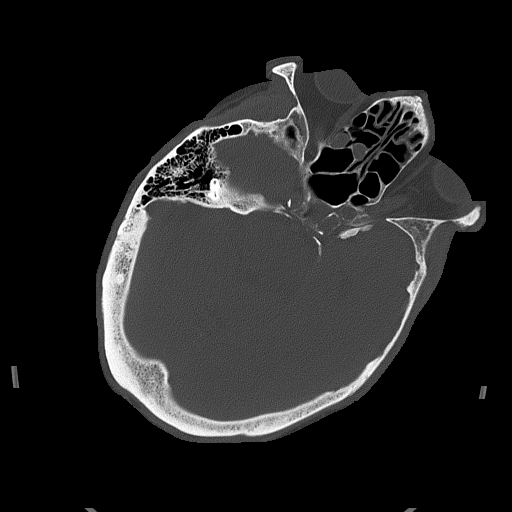
[im 59/98  bone]
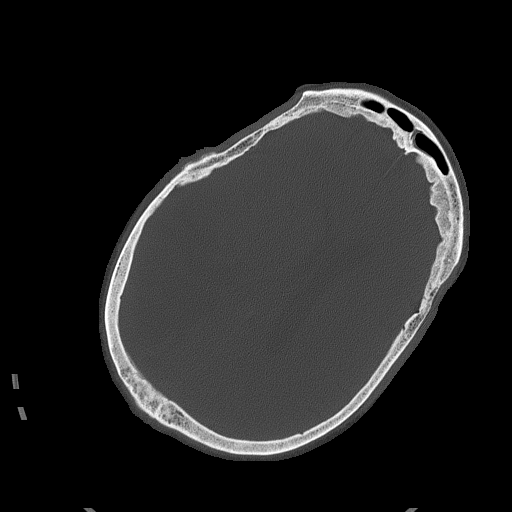
[im 68/98  bone]
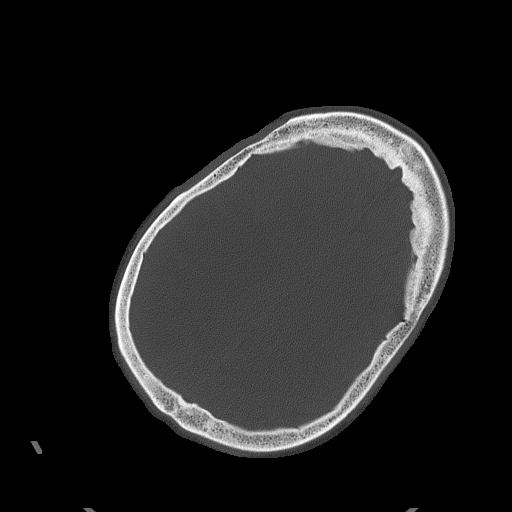
[im 78/98  bone]
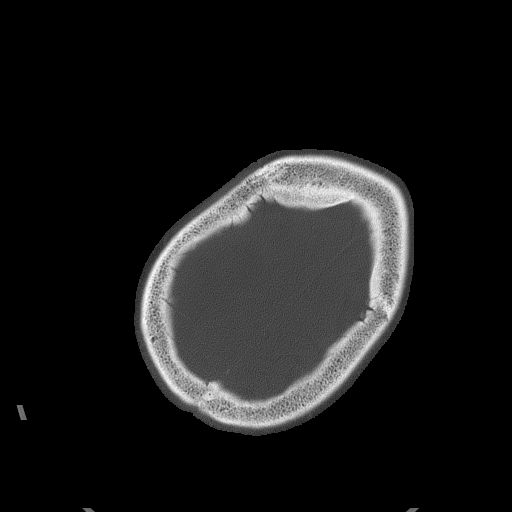
[im 88/98  bone]
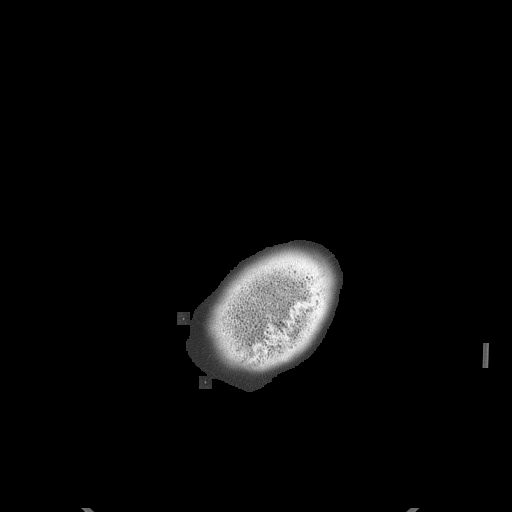

[Series 6: head without ax · axial · non-contrast · 0.47mm/px · z∈[-27,+37]mm · 2 of 40 slices shown]
[im 14/40  brain]
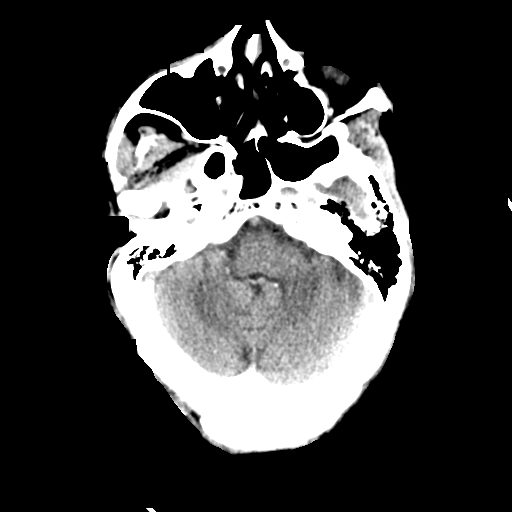
[im 27/40  brain]
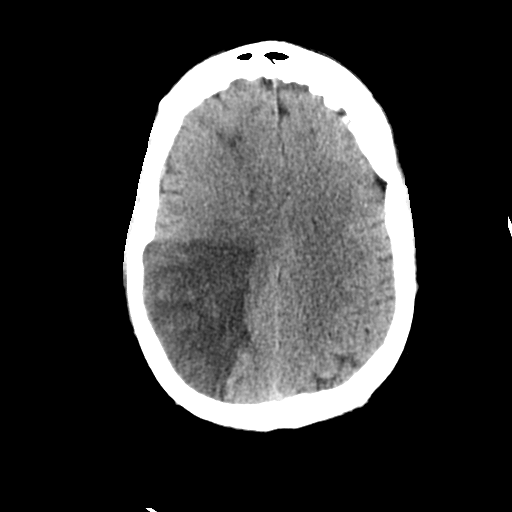

[Series 7: ax head bone · axial · 0.47mm/px · z∈[-55,+57]mm · 6 of 98 slices shown]
[im 10/98  bone]
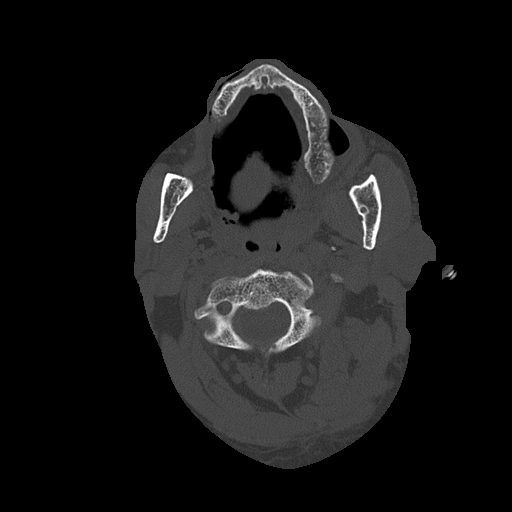
[im 20/98  bone]
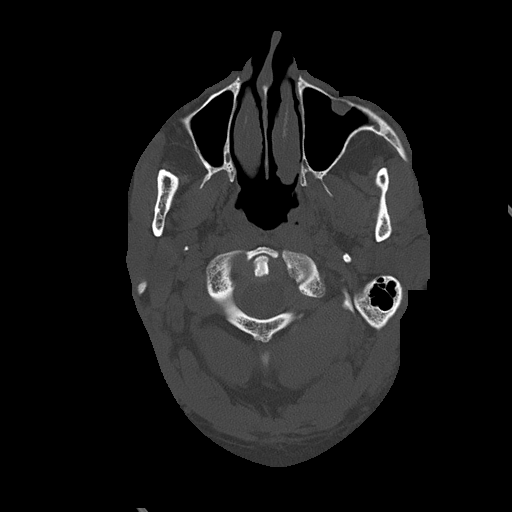
[im 30/98  bone]
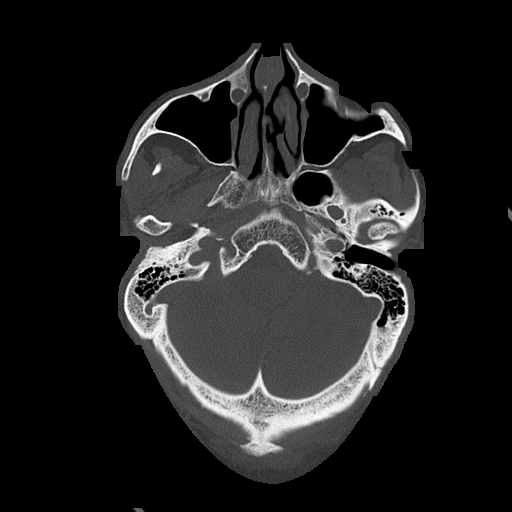
[im 39/98  bone]
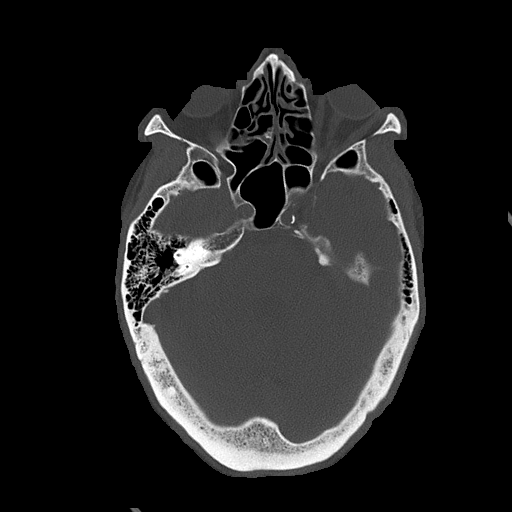
[im 59/98  bone]
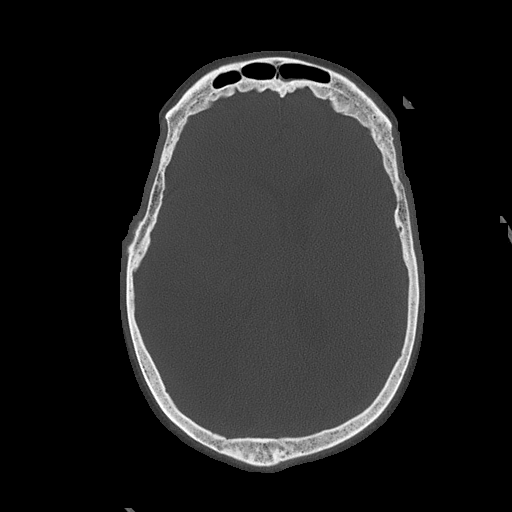
[im 68/98  bone]
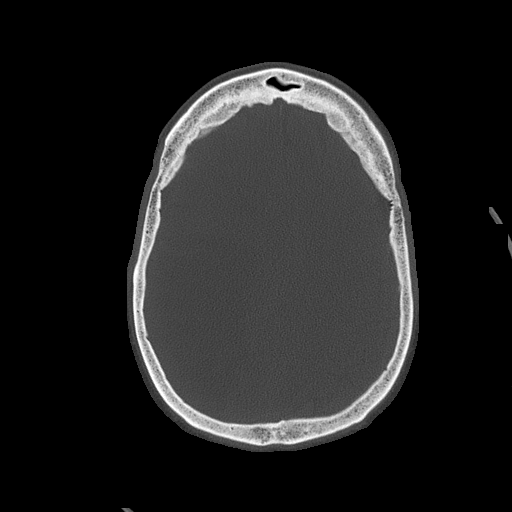

[18 of 30 positions shown; findings below may reference images not displayed]

FINDINGS: Brain: Large posterior division right MCA territory infarction with
petechial hemorrhage. Milder involvement in the anterior right
frontal lobe. Cytotoxic edema causes midline shift 6 mm, improved.
No entrapment.

Vascular: Atheromatous calcification.  No acute vessel hyperdensity.

Skull: Negative

Sinuses/Orbits: Negative
IMPRESSION: Large subacute infarct in the posterior division right MCA territory
with petechial hemorrhage and midline shift. No progression when
compared to prior.
